# Patient Record
Sex: Female | Born: 1940 | Race: White | Hispanic: No | Smoking: Former smoker
Health system: Southern US, Community
[De-identification: ages and names within clinical notes are randomized; demographics above are authoritative.]

## PROBLEM LIST (undated history)

## (undated) DIAGNOSIS — K254 Chronic or unspecified gastric ulcer with hemorrhage: Secondary | ICD-10-CM

## (undated) DIAGNOSIS — K219 Gastro-esophageal reflux disease without esophagitis: Secondary | ICD-10-CM

## (undated) DIAGNOSIS — C801 Malignant (primary) neoplasm, unspecified: Secondary | ICD-10-CM

## (undated) DIAGNOSIS — I428 Other cardiomyopathies: Secondary | ICD-10-CM

## (undated) DIAGNOSIS — I2510 Atherosclerotic heart disease of native coronary artery without angina pectoris: Secondary | ICD-10-CM

## (undated) DIAGNOSIS — M179 Osteoarthritis of knee, unspecified: Secondary | ICD-10-CM

## (undated) DIAGNOSIS — F329 Major depressive disorder, single episode, unspecified: Secondary | ICD-10-CM

## (undated) DIAGNOSIS — H269 Unspecified cataract: Secondary | ICD-10-CM

## (undated) DIAGNOSIS — Z8489 Family history of other specified conditions: Secondary | ICD-10-CM

## (undated) DIAGNOSIS — T7840XA Allergy, unspecified, initial encounter: Secondary | ICD-10-CM

## (undated) DIAGNOSIS — C4490 Unspecified malignant neoplasm of skin, unspecified: Secondary | ICD-10-CM

## (undated) DIAGNOSIS — Z8781 Personal history of (healed) traumatic fracture: Secondary | ICD-10-CM

## (undated) DIAGNOSIS — M1710 Unilateral primary osteoarthritis, unspecified knee: Secondary | ICD-10-CM

## (undated) DIAGNOSIS — E7800 Pure hypercholesterolemia, unspecified: Secondary | ICD-10-CM

## (undated) DIAGNOSIS — R519 Headache, unspecified: Secondary | ICD-10-CM

## (undated) DIAGNOSIS — E039 Hypothyroidism, unspecified: Secondary | ICD-10-CM

## (undated) DIAGNOSIS — G510 Bell's palsy: Secondary | ICD-10-CM

## (undated) DIAGNOSIS — R0600 Dyspnea, unspecified: Secondary | ICD-10-CM

## (undated) DIAGNOSIS — I5020 Unspecified systolic (congestive) heart failure: Secondary | ICD-10-CM

## (undated) DIAGNOSIS — F32A Depression, unspecified: Secondary | ICD-10-CM

## (undated) DIAGNOSIS — R51 Headache: Secondary | ICD-10-CM

## (undated) DIAGNOSIS — I519 Heart disease, unspecified: Secondary | ICD-10-CM

## (undated) DIAGNOSIS — N390 Urinary tract infection, site not specified: Secondary | ICD-10-CM

## (undated) DIAGNOSIS — I10 Essential (primary) hypertension: Secondary | ICD-10-CM

## (undated) DIAGNOSIS — K5730 Diverticulosis of large intestine without perforation or abscess without bleeding: Secondary | ICD-10-CM

## (undated) HISTORY — DX: Unspecified cataract: H26.9

## (undated) HISTORY — DX: Malignant (primary) neoplasm, unspecified: C80.1

## (undated) HISTORY — DX: Personal history of (healed) traumatic fracture: Z87.81

## (undated) HISTORY — DX: Allergy, unspecified, initial encounter: T78.40XA

## (undated) HISTORY — PX: JOINT REPLACEMENT: SHX530

## (undated) HISTORY — PX: HIP SURGERY: SHX245

## (undated) HISTORY — PX: BREAST BIOPSY: SHX20

## (undated) HISTORY — PX: APPENDECTOMY: SHX54

---

## 1998-09-09 ENCOUNTER — Other Ambulatory Visit: Admission: RE | Admit: 1998-09-09 | Discharge: 1998-09-09 | Payer: Self-pay | Admitting: Family Medicine

## 2001-01-05 ENCOUNTER — Ambulatory Visit (HOSPITAL_COMMUNITY): Admission: RE | Admit: 2001-01-05 | Discharge: 2001-01-05 | Payer: Self-pay | Admitting: Family Medicine

## 2001-01-05 ENCOUNTER — Encounter: Payer: Self-pay | Admitting: Family Medicine

## 2001-03-29 ENCOUNTER — Encounter (HOSPITAL_COMMUNITY): Admission: RE | Admit: 2001-03-29 | Discharge: 2001-04-28 | Payer: Self-pay | Admitting: General Surgery

## 2003-08-14 ENCOUNTER — Other Ambulatory Visit: Admission: RE | Admit: 2003-08-14 | Discharge: 2003-08-14 | Payer: Self-pay | Admitting: Family Medicine

## 2006-01-15 ENCOUNTER — Ambulatory Visit: Payer: Self-pay | Admitting: Cardiology

## 2009-07-05 ENCOUNTER — Encounter: Payer: Self-pay | Admitting: Cardiovascular Disease

## 2009-07-16 ENCOUNTER — Ambulatory Visit: Payer: Self-pay | Admitting: Cardiovascular Disease

## 2009-07-16 DIAGNOSIS — R072 Precordial pain: Secondary | ICD-10-CM | POA: Insufficient documentation

## 2009-07-16 DIAGNOSIS — R9431 Abnormal electrocardiogram [ECG] [EKG]: Secondary | ICD-10-CM | POA: Insufficient documentation

## 2009-07-25 ENCOUNTER — Telehealth (INDEPENDENT_AMBULATORY_CARE_PROVIDER_SITE_OTHER): Payer: Self-pay | Admitting: *Deleted

## 2009-07-29 ENCOUNTER — Ambulatory Visit: Payer: Self-pay | Admitting: Cardiovascular Disease

## 2009-07-29 ENCOUNTER — Encounter (HOSPITAL_COMMUNITY): Admission: RE | Admit: 2009-07-29 | Discharge: 2009-09-24 | Payer: Self-pay | Admitting: Cardiovascular Disease

## 2009-07-29 ENCOUNTER — Ambulatory Visit (HOSPITAL_COMMUNITY): Admission: RE | Admit: 2009-07-29 | Discharge: 2009-07-29 | Payer: Self-pay | Admitting: Cardiovascular Disease

## 2009-07-29 ENCOUNTER — Ambulatory Visit: Payer: Self-pay

## 2009-07-29 ENCOUNTER — Encounter (INDEPENDENT_AMBULATORY_CARE_PROVIDER_SITE_OTHER): Payer: Self-pay | Admitting: *Deleted

## 2009-07-29 ENCOUNTER — Encounter: Payer: Self-pay | Admitting: Internal Medicine

## 2009-07-29 ENCOUNTER — Ambulatory Visit: Payer: Self-pay | Admitting: Internal Medicine

## 2009-07-30 ENCOUNTER — Encounter: Payer: Self-pay | Admitting: Cardiovascular Disease

## 2009-11-19 ENCOUNTER — Encounter: Payer: Self-pay | Admitting: Cardiovascular Disease

## 2009-12-02 ENCOUNTER — Ambulatory Visit: Payer: Self-pay | Admitting: Cardiovascular Disease

## 2010-03-25 NOTE — Assessment & Plan Note (Signed)
Summary: PER CHECK OUT/PT HAVING ECHO/MYOVIEW/SAF   Visit Type:  Follow-up  CC:  echo/myoview follow up.  pt states that celexa has made a big difference in the ways she feels.  .  History of Present Illness: 70 yo WF with history of HTN, hyperlipidemia, depression and OA who is here today for cardiac follow up. She was seen several weeks ago for cardiac evaluation prior to planned left knee replacement. She was found to have an abnormal EKG which showed inferior Q waves during pre-operative assessment. She told  me that she has had occasional chest pains in the center of her chest that gets better with a "pain pill". Thre is associated dyspnea but no nausea or diaphoresis. I ordered an echo and a Lexiscan myoview to risk stratify. She returns today for her testing. I am seeing her to review the results.   She has no complaints today. No recent chest pain. No SOB.     Current Medications (verified): 1)  Fish Oil 1200 Mg Caps (Omega-3 Fatty Acids) .... 4 Capsules A Day 2)  Alendronate Sodium 70 Mg Tabs (Alendronate Sodium) .... Once A Week 3)  Levothyroxine Sodium 75 Mcg Tabs (Levothyroxine Sodium) .... Take 1 Tablet By Mouth Once A Day 4)  Tramadol Hcl 50 Mg Tabs (Tramadol Hcl) .... As Needed 5)  Verapamil Hcl Cr 240 Mg Cr-Tabs (Verapamil Hcl) .... Take 1 1/2 Tablets Once A Day 6)  Furosemide 20 Mg Tabs (Furosemide) .... Take One Tablet By Mouth Daily. 7)  Meloxicam 15 Mg Tabs (Meloxicam) .... Take 1 Tablet By Mouth Once A Day 8)  Symbyax 6-25 Mg Caps (Olanzapine-Fluoxetine Hcl) .... Take 1 Capsule By Mouth Once A Day 9)  Ventolin Hfa 108 (90 Base) Mcg/act Aers (Albuterol Sulfate) .... As Needed 10)  Potassium Chloride Crys Cr 20 Meq Cr-Tabs (Potassium Chloride Crys Cr) .... Take One Tablet By Mouth Daily 11)  Simvastatin 40 Mg Tabs (Simvastatin) .... Take One Tablet By Mouth Daily At Bedtime 12)  Celexa 20 Mg Tabs (Citalopram Hydrobromide) .... One Tablet Once Daily  Allergies  (verified): 1)  ! Codeine 2)  ! Benadryl  Past History:  Past Medical History: Reviewed history from 07/16/2009 and no changes required. Hypertension High cholesterol Irregular heart beat Arthritis Depression Fatigue Left knee pain  Social History: Reviewed history from 07/16/2009 and no changes required. Pt. is widow - 2 children Quit smoking 4 years ago No alcohol use No durg use Retired [employer] since 1993  Review of Systems  The patient denies fatigue, malaise, fever, weight gain/loss, vision loss, decreased hearing, hoarseness, chest pain, palpitations, shortness of breath, prolonged cough, wheezing, sleep apnea, coughing up blood, abdominal pain, blood in stool, nausea, vomiting, diarrhea, heartburn, incontinence, blood in urine, muscle weakness, joint pain, leg swelling, rash, skin lesions, headache, fainting, dizziness, depression, anxiety, enlarged lymph nodes, easy bruising or bleeding, and environmental allergies.    Vital Signs:  Patient profile:   70 year old female Height:      61 inches Weight:      183 pounds BMI:     34.70 Pulse rate:   76 / minute Pulse rhythm:   regular BP sitting:   120 / 78  (left arm) Cuff size:   large  Vitals Entered By: Judithe Modest CMA (July 29, 2009 3:31 PM)  Physical Exam  General:  General: Well developed, well nourished, NAD Musculoskeletal: Muscle strength 5/5 all ext Psychiatric: Mood and affect normal Neck: No JVD, no carotid bruits, no  thyromegaly, no lymphadenopathy. Lungs:Clear bilaterally, no wheezes, rhonci, crackles CV: RRR no murmurs, gallops rubs Abdomen: soft, NT, ND, BS present Extremities: No edema, pulses 2+.    Nuclear Study  Procedure date:  07/29/2009  Findings:      Stress Procedure   The patient received IV Lexiscan 0.4 mg over 15-seconds.  Myoview injected at 30-seconds.  There were no significant changes with infusion.  Quantitative spect images were obtained after a 45 minute  delay.  QPS  Raw Data Images:  Soft tissue (diaphragm) underlies heart. Stress Images:  Thinning in the inferior wall (base and apex).  Apical thinning..  Otherwise normal perfusion. Rest Images:  Minimal change from the stress images. Subtraction (SDS):  No significant ischemia. Transient Ischemic Dilatation:  1.16  (Normal <1.22)  Lung/Heart Ratio:  .28  (Normal <0.45)  Quantitative Gated Spect Images  QGS EDV:  84 ml QGS ESV:  31 ml QGS EF:  64 %   Overall Impression   Exercise Capacity: Lexiscan protocol BP Response: Normal blood pressure response. Clinical Symptoms: No chest pain ECG Impression: No significant ST segment change suggestive of ischemia. Overall Impression Comments: Probable normal perfsuion and soft tissue attenuation (diaphragm). No signif ischemia or scar.   Echocardiogram  Procedure date:  07/29/2009  Findings:      Study Conclusions            - Left ventricle: LVEF is approximately 50% The cavity size was       normal. Wall thickness was normal. Doppler parameters are       consistent with abnormal left ventricular relaxation (grade 1       diastolic dysfunction).     - Pericardium, extracardiac: A trivial pericardial effusion was       identified. - No significant valvular disease.   Impression & Recommendations:  Problem # 1:  PRE-OPERATIVE CARDIAC EXAM (ICD-V72.81) Stress test with no evidence of ischemia. LV function normal. No significant valvular disease. No further cardiac workup prior to planned surgical procedure. I feel that her chest pain is most likely non-cardiac.   Her updated medication list for this problem includes:    Verapamil Hcl Cr 240 Mg Cr-tabs (Verapamil hcl) .[name]... Take 1 1/2 tablets once a day  Problem # 2:  CHEST PAIN-PRECORDIAL (ICD-786.51) See above.   Her updated medication list for this problem includes:    Verapamil Hcl Cr 240 Mg Cr-tabs (Verapamil hcl) .[name]... Take 1 1/2 tablets once a day  Patient  Instructions: 1)  Your physician recommends that you schedule a follow-up appointment as needed

## 2010-03-25 NOTE — Letter (Signed)
Summary: Outpatient Coinsurance Notice  Outpatient Coinsurance Notice   Imported By: Marylou Mccoy 08/01/2009 11:40:50  _____________________________________________________________________  External Attachment:    Type:   Image     Comment:   External Document

## 2010-03-25 NOTE — Assessment & Plan Note (Signed)
Summary: np6/ surgical clearance for abn ekg, pt has medicare medicade...   Visit Type:  Initial Consult  CC:  Surgical clearance- total left knee replacement. Abnormal EKG.  History of Present Illness: 70 yo WF with history of HTN, hyperlipidemia, depression and OA who is here today for cardiac evaluation prior to planned left knee replacement. She was found to have an abnormal EKG which showed inferior Q waves. She tells me that she has occasional chest pains in the center of her chest that gets better with a "pain pill". Thre is associated dyspnea but no nausea or diaphoresis. She has not felt well for several years. She has no energy. Her breathing is ok at rest but she gets easily dyspneic with walking up a hill. She occasionally feels her heart beating "harder" but really has not noticed any irregular beats.   Current Medications (verified): 1)  Fish Oil 1200 Mg Caps (Omega-3 Fatty Acids) .... 4 Capsules A Day 2)  Alendronate Sodium 70 Mg Tabs (Alendronate Sodium) .... Once A Week 3)  Levothyroxine Sodium 75 Mcg Tabs (Levothyroxine Sodium) .... Take 1 Tablet By Mouth Once A Day 4)  Tramadol Hcl 50 Mg Tabs (Tramadol Hcl) .... As Needed 5)  Alprazolam 0.5 Mg Tabs (Alprazolam) .... As Needed 6)  Verapamil Hcl Cr 240 Mg Cr-Tabs (Verapamil Hcl) .... Take 1 1/2 Tablets Once A Day 7)  Furosemide 20 Mg Tabs (Furosemide) .... Take One Tablet By Mouth Daily. 8)  Meloxicam 15 Mg Tabs (Meloxicam) .... Take 1 Tablet By Mouth Once A Day 9)  Symbyax 6-25 Mg Caps (Olanzapine-Fluoxetine Hcl) .... Take 1 Capsule By Mouth Once A Day 10)  Ventolin Hfa 108 (90 Base) Mcg/act Aers (Albuterol Sulfate) .... As Needed 11)  Potassium Chloride Crys Cr 20 Meq Cr-Tabs (Potassium Chloride Crys Cr) .... Take One Tablet By Mouth Daily 12)  Simvastatin 40 Mg Tabs (Simvastatin) .... Take One Tablet By Mouth Daily At Bedtime 13)  Lorazepam 0.5 Mg Tabs (Lorazepam) .... As Needed  Allergies (verified): 1)  ! Codeine 2)   ! Benadryl  Past History:  Past Medical History: Arthritis Depression HTN Hyperlipidemia  Past Surgical History: None  Family History: Mother-deceased, age 4 ? cause Father- deceased, age 70s, CAD  2 sisters alive, one with Alzheimers 2 brothers-one deceased from cancer and one has cancer.   Social History: No tobacco No alcohol No illicit drug use Retired-worked at [employer] in the past Widow, 2 children  Review of Systems       The patient complains of fatigue, chest pain, and shortness of breath.  The patient denies malaise, fever, weight gain/loss, vision loss, decreased hearing, hoarseness, palpitations, prolonged cough, wheezing, sleep apnea, coughing up blood, abdominal pain, blood in stool, nausea, vomiting, diarrhea, heartburn, incontinence, blood in urine, muscle weakness, joint pain, leg swelling, rash, skin lesions, headache, fainting, dizziness, depression, anxiety, enlarged lymph nodes, easy bruising or bleeding, and environmental allergies.    Vital Signs:  Patient profile:   70 year old female Height:      61 inches Weight:      183.25 pounds BMI:     34.75 Pulse rate:   80 / minute Pulse rhythm:   regular Resp:     18 per minute BP sitting:   98 / 70  (right arm) Cuff size:   large  Vitals Entered By: Vikki Ports (Jul 16, 2009 10:55 AM)  Physical Exam  General:  General: Well developed, well nourished, NAD HEENT: OP clear, mucus membranes  moist SKIN: warm, dry Neuro: No focal deficits Musculoskeletal: Muscle strength 5/5 all ext Psychiatric: Mood and affect normal Neck: No JVD, no carotid bruits, no thyromegaly, no lymphadenopathy. Lungs:Clear bilaterally, no wheezes, rhonci, crackles CV: RRR no murmurs, gallops rubs Abdomen: soft, NT, ND, BS present Extremities: No edema, pulses 2+.    EKG  Procedure date:  07/16/2009  Findings:      NSR, rate 81 bpm. LVH. Possible inferior infarct with Q waves III, AvF.   Impression &  Recommendations:  Problem # 1:  PRE-OPERATIVE CARDIAC EXAM (ICD-V72.81) Ms. Broyles is here today for evaluation prior to planned knee replacement. 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 She has not had a recent echo and has had no previous cardiac workup. No previous ischemia testing. She tells me that she has episodes of chest pressure/pain with ongoing fatigue and exertional dyspnea. Based on all of this, I will arrange a Lexiscan myoview to rule out ischemia and an echo to assess for underlying structural heart disease, assess PA pressures.  I will see her back on two weeks (before her surgical follow up) to review the results.   Problem # 2:  ABNORMAL EKG (ICD-794.31)  See above.   Her updated medication list for this problem includes:    Verapamil Hcl Cr 240 Mg Cr-tabs (Verapamil hcl) .[name]... Take 1 1/2 tablets once a day  Orders: Echocardiogram (Echo) Nuclear Stress Test (Nuc Stress Test)  Her updated medication list for this problem includes:    Verapamil Hcl Cr 240 Mg Cr-tabs (Verapamil hcl) .[name]... Take 1 1/2 tablets once a day  Problem # 3:  CHEST PAIN-PRECORDIAL (ICD-786.51)  See above.   Her updated medication list for this problem includes:    Verapamil Hcl Cr 240 Mg Cr-tabs (Verapamil hcl) .[name]... Take 1 1/2 tablets once a day  Orders: EKG w/ Interpretation (93000) Echocardiogram (Echo) Nuclear Stress Test (Nuc Stress Test)  Her updated medication list for this problem includes:    Verapamil Hcl Cr 240 Mg Cr-tabs (Verapamil hcl) .[name]... Take 1 1/2 tablets once a day  Patient Instructions: 1)  Your physician recommends that you schedule a follow-up appointment on June 6 or 7 2)  Your physician recommends that you continue on your current medications as directed. Please refer to the Current Medication list given to you today. 3)  Your physician has requested that you have an echocardiogram.  Echocardiography is a painless test that uses sound waves to create images of  your heart. It provides your doctor with information about the size and shape of your heart and how well your heart's chambers and valves are working.  This procedure takes approximately one hour. There are no restrictions for this procedure. 4)  Your physician has requested that you have an adenosine myoview.  For further information please visit [email or web address]  Please follow instruction sheet, as given.

## 2010-03-25 NOTE — Progress Notes (Signed)
Summary: [facility]   [facility]   Imported By: Roderic Ovens 12/13/2009 14:23:50  _____________________________________________________________________  External Attachment:    Type:   Image     Comment:   External Document

## 2010-03-25 NOTE — Assessment & Plan Note (Signed)
Summary: Cardiology Nuclear Study  Nuclear Med Background Indications for Stress Test: Evaluation for Ischemia, Surgical Clearance, Abnormal EKG  Indications Comments: Pending (L) TKR   History Comments: NO DOCUMENTED CAD  Symptoms: Chest Tightness, Chest Tightness with Exertion, DOE, Fatigue, Rapid HR  Symptoms Comments: Last episode of CP:1 month   Nuclear Pre-Procedure Cardiac Risk Factors: Family History - CAD, History of Smoking, Hypertension, Lipids, Obesity Caffeine/Decaff Intake: None NPO After: 11:30 PM Lungs: Clear.  O2 Sat 96% on RA. IV 0.9% NS with Angio Cath: 22g     IV Site: (R) Wrist IV Started by: Irean Hong RN Chest Size (in) 38     Cup Size C     Height (in): 61 Weight (lb): 184 BMI: 34.89  Nuclear Med Study 1 or 2 day study:  1 day     Stress Test Type:  Eugenie Birks Reading MD:  Dietrich Pates, MD     Referring MD:  Verne Carrow, MD Resting Radionuclide:  Technetium 70m Tetrofosmin     Resting Radionuclide Dose:  11 mCi  Stress Radionuclide:  Technetium 25m Tetrofosmin     Stress Radionuclide Dose:  33 mCi   Stress Protocol   Lexiscan: 0.4 mg   Stress Test Technologist:  Rea College CMA-N     Nuclear Technologist:  Domenic Polite CNMT  Rest Procedure  Myocardial perfusion imaging was performed at rest 45 minutes following the intravenous administration of Myoview Technetium 7m Tetrofosmin.  Stress Procedure  The patient received IV Lexiscan 0.4 mg over 15-seconds.  Myoview injected at 30-seconds.  There were no significant changes with infusion.  Quantitative spect images were obtained after a 45 minute delay.  QPS Raw Data Images:  Soft tissue (diaphragm) underlies heart. Stress Images:  Thinning in the inferior wall (base and apex).  Apical thinning..  Otherwise normal perfusion. Rest Images:  Minimal change from the stress images. Subtraction (SDS):  No significant ischemia. Transient Ischemic Dilatation:  1.16  (Normal <1.22)  Lung/Heart Ratio:  .28  (Normal <0.45)  Quantitative Gated Spect Images QGS EDV:  84 ml QGS ESV:  31 ml QGS EF:  64 %   Overall Impression  Exercise Capacity: Lexiscan protocol BP Response: Normal blood pressure response. Clinical Symptoms: No chest pain ECG Impression: No significant ST segment change suggestive of ischemia. Overall Impression Comments: Probable normal perfsuion and soft tissue attenuation (diaphragm). No signif ischemia or scar.  Appended Document: Cardiology Nuclear Study No evidence of ischemia. d/w pt's niece on phone (pt asked me to call her niece with results). cdm

## 2010-03-25 NOTE — Assessment & Plan Note (Signed)
Summary: [name]. CHESTPAIN ,EKG CHANGES/ GD   Visit Type:  rov Primary Provider:  Paulita [name]  CC:  chest pain....denies any sob or edema.  History of Present Illness: 70 yo WF with history of HTN, hyperlipidemia, depression and OA who is here today for cardiac follow up. She was seen last in June 2011 for cardiac evaluation prior to planned left knee replacement. She was found to have an abnormal EKG which showed inferior Q waves during pre-operative assessment. She told  me that she has had occasional chest pains in the center of her chest that gets better with a "pain pill". There was associated dyspnea but no nausea or diaphoresis. I ordered an echo and a Lexiscan myoview to risk stratify. Her stress test showed no ischemia. Her echo   She was recently seen by her primary care provider. She tells me that an EKG in primary care was abnormal. I do not have that EKG for review. Last week, she was driving home from the grocery store and had onset of substernal chest pain. No radiation of pain. Pain lasted for several minutes and she took a Tramadol. Was a heavy sensation. No associated SOB, dizziness, diaphoresis or nausea. This is the only episode of chest pain that she has had over the past 4 months. She denies any exertional chest pain or SOB. No near syncope or syncope. She has been taking  all of her medications.     Current Medications (verified): 1)  Fish Oil 1200 Mg Caps (Omega-3 Fatty Acids) .... 4 Capsules A Day 2)  Alendronate Sodium 70 Mg Tabs (Alendronate Sodium) .... Once A Week 3)  Levothyroxine Sodium 75 Mcg Tabs (Levothyroxine Sodium) .... Take 1 Tablet By Mouth Once A Day 4)  Tramadol Hcl 50 Mg Tabs (Tramadol Hcl) .... As Needed 5)  Verapamil Hcl Cr 240 Mg Cr-Tabs (Verapamil Hcl) .... Take 1 1/2 Tablets Once A Day 6)  Furosemide 20 Mg Tabs (Furosemide) .... Take One Tablet By Mouth Daily. 7)  Meloxicam 15 Mg Tabs (Meloxicam) .... Take 1 Tablet By Mouth Once A Day 8)  Symbyax  6-25 Mg Caps (Olanzapine-Fluoxetine Hcl) .... Take 1 Capsule By Mouth Once A Day 9)  Ventolin Hfa 108 (90 Base) Mcg/act Aers (Albuterol Sulfate) .... As Needed 10)  Potassium Chloride Crys Cr 20 Meq Cr-Tabs (Potassium Chloride Crys Cr) .... Take One Tablet By Mouth Daily 11)  Simvastatin 40 Mg Tabs (Simvastatin) .... Take One Tablet By Mouth Daily At Bedtime 12)  Celexa 20 Mg Tabs (Citalopram Hydrobromide) .... One Tablet Once Daily  Allergies: 1)  ! Codeine 2)  ! Benadryl  Past History:  Past Medical History: Reviewed history from 07/16/2009 and no changes required. Hypertension High cholesterol Irregular heart beat Arthritis Depression Fatigue Left knee pain  Social History: Reviewed history from 07/16/2009 and no changes required. Pt. is widow - 2 children Quit smoking 4 years ago No alcohol use No durg use Retired [employer] since 1993  Review of Systems       The patient complains of chest pain.  The patient denies fatigue, malaise, fever, weight gain/loss, vision loss, decreased hearing, hoarseness, palpitations, shortness of breath, prolonged cough, wheezing, sleep apnea, coughing up blood, abdominal pain, blood in stool, nausea, vomiting, diarrhea, heartburn, incontinence, blood in urine, muscle weakness, joint pain, leg swelling, rash, skin lesions, headache, fainting, dizziness, depression, anxiety, enlarged lymph nodes, easy bruising or bleeding, and environmental allergies.    Vital Signs:  Patient profile:   70 year old  female Height:      61 inches Weight:      184.8 pounds BMI:     35.04 Pulse rate:   72 / minute Pulse rhythm:   irregular BP sitting:   126 / 80  (left arm) Cuff size:   large  Vitals Entered By: Danielle Rankin, CMA (December 02, 2009 3:01 PM)  Physical Exam  General:  General: Well developed, well nourished, NAD Musculoskeletal: Muscle strength 5/5 all ext Psychiatric: Mood and affect normal Neck: No JVD, no carotid bruits, no thyromegaly,  no lymphadenopathy. Lungs:Clear bilaterally, no wheezes, rhonci, crackles CV: RRR no murmurs, gallops rubs Abdomen: soft, NT, ND, BS present Extremities: No edema, pulses 2+.    EKG  Procedure date:  12/02/2009  Findings:      NSR, rate 72 bpm. Poor R wave progression through the precordial leads.   Impression & Recommendations:  Problem # 1:  CHEST PAIN-PRECORDIAL (ICD-786.51) Her chest pain is atypical. Occurred at rest. One episode over last 4 months. This does not sound cardiac. She had a normal stress test four months ago and normal echo at that time. No further ischemic workup at this time. Pain is most likely non-cardiac.  She will call us if she has more frequent episodes of chest pain or if the chest pain changes in character.   Her updated medication list for this problem includes:    Verapamil Hcl Cr 240 Mg Cr-tabs (Verapamil hcl) .[name]... Take 1 1/2 tablets once a day  Orders: EKG w/ Interpretation (93000)  Patient Instructions: 1)  Your physician recommends that you schedule a follow-up appointment in: 6 months 2)  Your physician recommends that you continue on your current medications as directed. Please refer to the Current Medication list given to you today.

## 2010-03-25 NOTE — Progress Notes (Signed)
Summary: Nuclear Pre-Procedure  Phone Note Outgoing Call   Call placed by: Milana Na, EMT-P,  July 25, 2009 2:36 PM Summary of Call: Reviewed information on Myoview Information Sheet (see scanned document for further details).  Busy     Nuclear Med Background Indications for Stress Test: Evaluation for Ischemia, Surgical Clearance, Abnormal EKG  Indications Comments: Inferior Q's, LVH, and Pending (L) TKR    Symptoms: Chest Pain, DOE, Fatigue    Nuclear Pre-Procedure Cardiac Risk Factors: Family History - CAD, Hypertension, Lipids Height (in): 61  Nuclear Med Study Referring MD:  C.McAlhany

## 2010-03-28 NOTE — Letter (Signed)
Summary: Surgical Clearance - [facility]  Surgical Clearance - [facility]   Imported By: Marylou Mccoy 07/30/2009 09:16:27  _____________________________________________________________________  External Attachment:    Type:   Image     Comment:   External Document

## 2010-04-24 HISTORY — PX: KNEE SURGERY: SHX244

## 2010-04-28 ENCOUNTER — Other Ambulatory Visit (HOSPITAL_COMMUNITY): Payer: Medicare Other

## 2010-04-29 ENCOUNTER — Other Ambulatory Visit: Payer: Self-pay | Admitting: Specialist

## 2010-04-29 ENCOUNTER — Ambulatory Visit (HOSPITAL_COMMUNITY)
Admission: RE | Admit: 2010-04-29 | Discharge: 2010-04-29 | Disposition: A | Discharge status: Home / Self Care | Payer: Medicare Other | Source: Ambulatory Visit | Attending: Anesthesiology | Admitting: Anesthesiology

## 2010-04-29 ENCOUNTER — Other Ambulatory Visit (HOSPITAL_COMMUNITY): Payer: Self-pay | Admitting: Anesthesiology

## 2010-04-29 ENCOUNTER — Encounter (HOSPITAL_COMMUNITY): Payer: Medicare Other

## 2010-04-29 DIAGNOSIS — Z01812 Encounter for preprocedural laboratory examination: Secondary | ICD-10-CM | POA: Insufficient documentation

## 2010-04-29 DIAGNOSIS — Z01818 Encounter for other preprocedural examination: Secondary | ICD-10-CM | POA: Insufficient documentation

## 2010-04-29 DIAGNOSIS — Z01811 Encounter for preprocedural respiratory examination: Secondary | ICD-10-CM

## 2010-04-29 LAB — COMPREHENSIVE METABOLIC PANEL
ALT: 37 U/L — ABNORMAL HIGH (ref 0–35)
AST: 38 U/L — ABNORMAL HIGH (ref 0–37)
Albumin: 4.5 g/dL (ref 3.5–5.2)
Alkaline Phosphatase: 49 U/L (ref 39–117)
BUN: 11 mg/dL (ref 6–23)
CO2: 28 mEq/L (ref 19–32)
Calcium: 10.3 mg/dL (ref 8.4–10.5)
Chloride: 103 mEq/L (ref 96–112)
Creatinine, Ser: 0.64 mg/dL (ref 0.4–1.2)
GFR calc Af Amer: >60 mL/min (ref >60)
GFR calc non Af Amer: >60 mL/min (ref >60)
Glucose, Bld: 103 mg/dL — ABNORMAL HIGH (ref 70–99)
Potassium: 4.1 mEq/L (ref 3.5–5.1)
Sodium: 141 mEq/L (ref 135–145)
Total Bilirubin: 1 mg/dL (ref 0.3–1.2)
Total Protein: 7.5 g/dL (ref 6.0–8.3)

## 2010-04-29 LAB — URINALYSIS, ROUTINE W REFLEX MICROSCOPIC
Bilirubin Urine: NEGATIVE
Glucose, UA: NEGATIVE mg/dL
Hgb urine dipstick: NEGATIVE
Ketones, ur: NEGATIVE mg/dL
Nitrite: NEGATIVE
Protein, ur: NEGATIVE mg/dL
Specific Gravity, Urine: 1.018 (ref 1.005–1.030)
Urobilinogen, UA: 0.2 mg/dL (ref 0.0–1.0)
pH: 7.5 (ref 5.0–8.0)

## 2010-04-29 LAB — DIFFERENTIAL
Basophils Absolute: 0.1 10*3/uL (ref 0.0–0.1)
Basophils Relative: 1 % (ref 0–1)
Eosinophils Absolute: 0.2 10*3/uL (ref 0.0–0.7)
Eosinophils Relative: 2 % (ref 0–5)
Lymphocytes Relative: 36 % (ref 12–46)
Lymphs Abs: 3.6 10*3/uL (ref 0.7–4.0)
Monocytes Absolute: 0.6 10*3/uL (ref 0.1–1.0)
Monocytes Relative: 6 % (ref 3–12)
Neutro Abs: 5.4 10*3/uL (ref 1.7–7.7)
Neutrophils Relative %: 55 % (ref 43–77)

## 2010-04-29 LAB — CBC
HCT: 43.5 % (ref 36.0–46.0)
Hemoglobin: 14.5 g/dL (ref 12.0–15.0)
MCH: 29.4 pg (ref 26.0–34.0)
MCHC: 33.3 g/dL (ref 30.0–36.0)
MCV: 88.2 fL (ref 78.0–100.0)
Platelets: 301 K/uL (ref 150–400)
RBC: 4.93 MIL/uL (ref 3.87–5.11)
RDW: 13.8 % (ref 11.5–15.5)
WBC: 9.9 K/uL (ref 4.0–10.5)

## 2010-04-29 LAB — SURGICAL PCR SCREEN
MRSA, PCR: NEGATIVE
Staphylococcus aureus: NEGATIVE

## 2010-04-29 LAB — PROTIME-INR
INR: 0.98 (ref 0.00–1.49)
Prothrombin Time: 13.2 s (ref 11.6–15.2)

## 2010-04-29 LAB — URINE MICROSCOPIC-ADD ON

## 2010-04-29 LAB — APTT: aPTT: 33 s (ref 24–37)

## 2010-05-02 ENCOUNTER — Inpatient Hospital Stay (HOSPITAL_COMMUNITY)
Admission: RE | Admit: 2010-05-02 | Discharge: 2010-05-05 | DRG: 470 | Disposition: A | Discharge status: Home Health | Payer: Medicare Other | Source: Ambulatory Visit | Attending: Specialist | Admitting: Specialist

## 2010-05-02 DIAGNOSIS — D62 Acute posthemorrhagic anemia: Secondary | ICD-10-CM | POA: Diagnosis not present

## 2010-05-02 DIAGNOSIS — E039 Hypothyroidism, unspecified: Secondary | ICD-10-CM | POA: Diagnosis present

## 2010-05-02 DIAGNOSIS — I10 Essential (primary) hypertension: Secondary | ICD-10-CM | POA: Diagnosis present

## 2010-05-02 DIAGNOSIS — Z87440 Personal history of urinary (tract) infections: Secondary | ICD-10-CM

## 2010-05-02 DIAGNOSIS — F341 Dysthymic disorder: Secondary | ICD-10-CM | POA: Diagnosis present

## 2010-05-02 DIAGNOSIS — M1710 Unilateral primary osteoarthritis, unspecified knee: Principal | ICD-10-CM | POA: Diagnosis present

## 2010-05-02 DIAGNOSIS — E7800 Pure hypercholesterolemia, unspecified: Secondary | ICD-10-CM | POA: Diagnosis present

## 2010-05-02 LAB — TYPE AND SCREEN
ABO/RH(D): O POS
Antibody Screen: NEGATIVE

## 2010-05-02 LAB — ABO/RH: ABO/RH(D): O POS

## 2010-05-03 LAB — BASIC METABOLIC PANEL
BUN: 9 mg/dL (ref 6–23)
CO2: 24 mEq/L (ref 19–32)
Calcium: 9 mg/dL (ref 8.4–10.5)
Chloride: 109 mEq/L (ref 96–112)
Creatinine, Ser: 0.87 mg/dL (ref 0.4–1.2)
GFR calc Af Amer: >60 mL/min (ref >60)
GFR calc non Af Amer: >60 mL/min (ref >60)
Glucose, Bld: 130 mg/dL — ABNORMAL HIGH (ref 70–99)
Potassium: 4.7 mEq/L (ref 3.5–5.1)
Sodium: 137 mEq/L (ref 135–145)

## 2010-05-03 LAB — CBC
HCT: 31.2 % — ABNORMAL LOW (ref 36.0–46.0)
Hemoglobin: 10.1 g/dL — ABNORMAL LOW (ref 12.0–15.0)
MCH: 28.9 pg (ref 26.0–34.0)
MCHC: 32.4 g/dL (ref 30.0–36.0)
MCV: 89.4 fL (ref 78.0–100.0)
Platelets: 189 10*3/uL (ref 150–400)
RBC: 3.49 MIL/uL — ABNORMAL LOW (ref 3.87–5.11)
RDW: 13.9 % (ref 11.5–15.5)
WBC: 11.6 10*3/uL — ABNORMAL HIGH (ref 4.0–10.5)

## 2010-05-04 LAB — CBC
HCT: 29.6 % — ABNORMAL LOW (ref 36.0–46.0)
Hemoglobin: 9.8 g/dL — ABNORMAL LOW (ref 12.0–15.0)
MCH: 29.3 pg (ref 26.0–34.0)
MCHC: 33.1 g/dL (ref 30.0–36.0)
MCV: 88.6 fL (ref 78.0–100.0)
Platelets: 189 10*3/uL (ref 150–400)
RBC: 3.34 MIL/uL — ABNORMAL LOW (ref 3.87–5.11)
RDW: 13.7 % (ref 11.5–15.5)
WBC: 12.9 10*3/uL — ABNORMAL HIGH (ref 4.0–10.5)

## 2010-05-04 LAB — BASIC METABOLIC PANEL
BUN: 8 mg/dL (ref 6–23)
CO2: 22 mEq/L (ref 19–32)
Calcium: 9.3 mg/dL (ref 8.4–10.5)
Chloride: 107 mEq/L (ref 96–112)
Creatinine, Ser: 0.74 mg/dL (ref 0.4–1.2)
GFR calc Af Amer: >60 mL/min (ref >60)
GFR calc non Af Amer: >60 mL/min (ref >60)
Glucose, Bld: 134 mg/dL — ABNORMAL HIGH (ref 70–99)
Potassium: 4 mEq/L (ref 3.5–5.1)
Sodium: 134 mEq/L — ABNORMAL LOW (ref 135–145)

## 2010-05-05 LAB — CBC
HCT: 25.7 % — ABNORMAL LOW (ref 36.0–46.0)
Hemoglobin: 8.4 g/dL — ABNORMAL LOW (ref 12.0–15.0)
MCH: 28.9 pg (ref 26.0–34.0)
MCHC: 32.7 g/dL (ref 30.0–36.0)
MCV: 88.3 fL (ref 78.0–100.0)
Platelets: 170 10*3/uL (ref 150–400)
RBC: 2.91 MIL/uL — ABNORMAL LOW (ref 3.87–5.11)
RDW: 13.6 % (ref 11.5–15.5)
WBC: 11 10*3/uL — ABNORMAL HIGH (ref 4.0–10.5)

## 2010-05-22 ENCOUNTER — Ambulatory Visit: Payer: Medicare Other | Attending: Specialist | Admitting: Physical Therapy

## 2010-05-22 DIAGNOSIS — M6281 Muscle weakness (generalized): Secondary | ICD-10-CM | POA: Insufficient documentation

## 2010-05-22 DIAGNOSIS — M25669 Stiffness of unspecified knee, not elsewhere classified: Secondary | ICD-10-CM | POA: Insufficient documentation

## 2010-05-22 DIAGNOSIS — Z96659 Presence of unspecified artificial knee joint: Secondary | ICD-10-CM | POA: Insufficient documentation

## 2010-05-22 DIAGNOSIS — R5381 Other malaise: Secondary | ICD-10-CM | POA: Insufficient documentation

## 2010-05-22 DIAGNOSIS — IMO0001 Reserved for inherently not codable concepts without codable children: Secondary | ICD-10-CM | POA: Insufficient documentation

## 2010-05-22 DIAGNOSIS — M25569 Pain in unspecified knee: Secondary | ICD-10-CM | POA: Insufficient documentation

## 2010-05-27 ENCOUNTER — Ambulatory Visit: Payer: Medicare Other | Attending: Specialist | Admitting: Physical Therapy

## 2010-05-27 DIAGNOSIS — M6281 Muscle weakness (generalized): Secondary | ICD-10-CM | POA: Insufficient documentation

## 2010-05-27 DIAGNOSIS — M25669 Stiffness of unspecified knee, not elsewhere classified: Secondary | ICD-10-CM | POA: Insufficient documentation

## 2010-05-27 DIAGNOSIS — R5381 Other malaise: Secondary | ICD-10-CM | POA: Insufficient documentation

## 2010-05-27 DIAGNOSIS — M25569 Pain in unspecified knee: Secondary | ICD-10-CM | POA: Insufficient documentation

## 2010-05-27 DIAGNOSIS — Z96659 Presence of unspecified artificial knee joint: Secondary | ICD-10-CM | POA: Insufficient documentation

## 2010-05-27 DIAGNOSIS — IMO0001 Reserved for inherently not codable concepts without codable children: Secondary | ICD-10-CM | POA: Insufficient documentation

## 2010-05-29 ENCOUNTER — Ambulatory Visit: Payer: Medicare Other | Admitting: Physical Therapy

## 2010-06-03 ENCOUNTER — Ambulatory Visit: Payer: Medicare Other | Admitting: Physical Therapy

## 2010-06-05 ENCOUNTER — Ambulatory Visit: Payer: Medicare Other | Admitting: Physical Therapy

## 2010-06-10 ENCOUNTER — Ambulatory Visit: Payer: Medicare Other | Admitting: *Deleted

## 2010-06-12 ENCOUNTER — Encounter: Payer: Medicare Other | Admitting: *Deleted

## 2010-06-19 NOTE — H&P (Signed)
Sheila [name], Sheila [name]                  ACCOUNT NO.:  [id number]  MEDICAL RECORD NO.:  [id number]           PATIENT TYPE:  I  LOCATION:  1S                           FACILITY:  [facility]  PHYSICIAN:  Erasmo Leventhal, M.D.DATE OF BIRTH:  1940/09/22  DATE OF ADMISSION:  05/02/2010 DATE OF DISCHARGE:                             HISTORY & PHYSICAL   CHIEF COMPLAINT:  Bilateral knee pain.  HISTORY OF PRESENT ILLNESS:  The patient is a 70 year old female, well known to Dr. Thomasena Edis, for evaluation/treatment of bilateral knee pain. The patient has failed conservative treatment.  The patient has elected to proceed with a total knee arthroplasty at home, but left is significantly worse than the right.  X-rays reveal she is bone-on-bone medial compartment.  She has got significant tricompartmental arthritic changes.  She does have a slight varus deformity.  ALLERGIES:  ADVIL and CODEINE, both causing severe nausea.  The patient's physician's medical history indicates her allergies are CODEINE and BENADRYL.  CURRENT MEDICATIONS: 1. Fish oil. 2. Alendronate. 3. Levothyroxine. 4. Tramadol. 5. Verapamil. 6. Lasix. 7. Meloxicam. 8. Symbyax. 9. Ventolin inhaler. 10.Potassium. 11.Simvastatin. 12.Celexa.  PAST MEDICAL HISTORY: 1. Anxiety/depression. 2. Hypertension. 3. Hypercholesterolemia. 4. Cardiac dysrhythmia. 5. Thyroid disease. 6. Obesity.  REVIEW OF SYSTEMS:  NEUROLOGIC:  She does have her anxiety and depression, she feels they are well controlled, managed by her primary care physician.  She denies any history of strokes or seizures.  She has had shingles on the right lower and left lower abdominal wall region. PULMONARY:  She has had pneumonia in the past, several years previous. No problems with COPD, sleep apnea, asthma, or tuberculosis. CARDIOVASCULAR:  She does have hypertension, hypercholesteremia, history of a heart arrhythmia.  She had a cardiac stress test, June  2011, through the [facility] with a stress test that showed no evidence of ischemia.  Left ventricular function was normal.  No significant vascular disease or bruits.  She denies any recent chest pains, shortness of breath, or irregular heart rhythms.  GI:  She denies any issues with reflux, ulcers, hepatitis, gallbladder issues, irritable bowel, colitis, colon or pancreatic issues.  GU:  She denies any urinary incontinence and kidney stones.  She has had urinary tract infection, probably several months ago.  Her preop lab work did show some bacteria and she was placed on Bactrim DS and she will be on it full 2 days prior to her surgery.  ENDOCRINE:  She does have history of thyroid disease. She denies any diabetes, but she has had some hypoglycemic events in the past.  HEMATOLOGIC:  Denies any anemia, blood clots.  She has had some skin cancers removed from her face.  PAST SURGICAL HISTORY:  She had breast biopsies without any problems with anesthesia, but she had a difficult time laying her head down flat on the table because it does cause her head to swim.  FAMILY MEDICAL HISTORY:  Father was deceased from lung cancer.  Mother had a stroke, gallbladder disease.  SOCIAL HISTORY:  The patient is widowed.  She does dip, snuff.  No alcohol.  She has  got 2 grown children.  Family will provide care postop.  PHYSICAL EXAMINATION:  VITAL SIGNS:  Height is 5 feet 1.5 inches, weight is 179, blood pressure is 128/78, pulse of 80 and regular, respirations 12 and nonlabored, patient is afebrile. GENERAL:  This is a heavy-set female, conscious, alert, and appropriate. She does walk without any assistance. HEENT:  Head is normocephalic.  Pupils equal, round, and reactive. Gross hearing is intact. NECK:  Supple.  No palpable lymphadenopathy.  Good range of motion. CHEST:  Lung sounds are clear. HEART:  Regular rate and rhythm. ABDOMEN:  Soft.  Bowel sounds present, round,  obese. EXTREMITIES:  Upper extremities had good range of motion with good motor strength.  Lower extremities, she had good motion of both hips.  She lacked about 5 degrees of full extension on her left.  She was able to flex it back to 120.  She did have crepitus in knee.  She had no instability.  Her right knee, she was able to get near-full extension, flexion to 120.  No instability. PERIPHERAL VASCULAR:  Carotid pulses were 2+, radial pulses 2+, posterior tibial pulses 1+.  She did have some lower extremity edema and a few scattered varicosities, but no significant pigmentation changes. BREAST, RECTAL, AND GU:  Deferred at this time.  IMPRESSION: 1. End-stage osteoarthritis, left knee, bone-on-bone medial     compartment, varus deformity, tricompartmental arthritic changes. 2. Moderate arthritic changes, right knee. 3. Anxiety/depression. 4. Hypertension. 5. Hypercholesterolemia. 6. Cardiac dysrhythmia. 7. History of urinary tract infections, currently on an antibiotic. 8. Thyroid disease.  PLAN:  The patient will undergo all routine labs and tests prior to having a left total knee arthroplasty by Dr. Thomasena Edis at [facility] on May 02, 2010.  The patient's primary care physician is [facility].  She has had a cardiac preoperative workup through the [facility], Dr. Verne Carrow.     Jamelle Rushing, P.A.   ______________________________ Erasmo Leventhal, M.D.    RWK/MEDQ  D:  05/01/2010  T:  05/02/2010  Job:  [id number]  Electronically Signed by Arlyn Leak P.A. on 05/28/2010 07:44:20 AM Electronically Signed by Eugenia Mcalpine M.D. on 06/19/2010 09:25:44 AM

## 2010-06-19 NOTE — Discharge Summary (Signed)
[name], Sheila [name]                  ACCOUNT NO.:  [id number]  MEDICAL RECORD NO.:  [id number]           PATIENT TYPE:  I  LOCATION:  1610                         FACILITY:  [facility]  PHYSICIAN:  Erasmo Leventhal, M.D.DATE OF BIRTH:  [birth year]  DATE OF ADMISSION:  05/02/2010 DATE OF DISCHARGE:  05/05/2010                              DISCHARGE SUMMARY   ADMISSION DIAGNOSES: 1. Bilateral knee pain with end-stage osteoarthritis left knee. 2. Anxiety/depression. 3. Hypertension. 4. Hypercholesterolemia. 5. Cardiac dysrhythmia. 6. History of urinary tract infections with currently on a antibiotic     due to preoperative bacteremia. 7. Hypothyroid disease.  DISCHARGE DIAGNOSES: 1. Left total knee arthroplasty, stable without complications. 2. Moderate arthritis, right knee. 3. Acute postoperative blood loss anemia, asymptomatic, stable vital     signs, tolerates well with physical therapy.  Will allow to self-     correct with p.o. supplementation of iron. 4. Anxiety/depression. 5. Hypertension. 6. Hypercholesterolemia. 7. History of cardiac dysrhythmia. 8. History of urinary tract infections with bacteremia on preoperative     labs, probable contaminant. 9. History of thyroid disease.  HISTORY OF PRESENT ILLNESS:  The patient is a 70 year old female well known to Dr. Thomasena Edis for treatment of bilateral knee pain.  The patient has failed conservative treatment.  The patient elected to proceed with left total knee arthroplasty due to significantly worsening left knee pain.  X-rays revealed she was bone-on-bone medial compartment with tricompartmental arthritic changes.  ALLERGIES ON ADMISSION:  ADVIL and CODEINE causing nausea.  Medical history indicated CODEINE and BENADRYL.  MEDICATIONS ON ADMISSION: 1. Fish oil. 2. Alendronate. 3. Levothyroxine. 4. Tramadol. 5. Verapamil. 6. Lasix. 7. Meloxicam. 8. Symbyax. 9. Ventolin  inhaler. 10.Potassium. 11.Simvastatin. 12.Celexa.  SURGICAL PROCEDURES:  On May 02, 2010, the patient was taken to the OR by Dr. Valma Cava, assisted by Oneida Alar, PA-C.  Under regional and spinal anesthesia, the patient underwent a left total knee arthroplasty without any complications.  The patient was transferred to recovery room then to the orthopedic floor in good condition to follow a total knee protocol.  The patient had the following components implanted, a size 2.5 MBT Keel tibial tray, a size 2.5 Sigma femoral component, a size 2.5, 12.5 thickness polyethylene bearing, a size 35 three-peg patella.  CONSULS:  Following routine consults requested: 1. Physical Therapy. 2. Case Management.  HOSPITAL COURSE:  On May 02, 2010, the patient was admitted to [facility] for total knee arthroplasty on the left.  The patient had the surgical procedure performed without any complications, transferred to the recovery room and then to the orthopedic floor in good condition. The patient then spent 3 days postoperative course on the orthopedic floor in which the patient did very well.  The patient had no complaints or problems.  The patient's vital signs remained stable.  She remained afebrile.  The patient's hemoglobin did drop down to 8.4 but she tolerated it very well without any unstable vital signs.  She also denied lightheadedness or dizziness, shortness of breath or chest discomfort while she was up and participating with Physical Therapy and she  was able to walk about 90 feet on postop day #2.  The patient denied any issues.  Her blood level was discussed with the patient, and she decided to allow to self correct with p.o. supplementation with iron over the next couple of weeks.  The patient was also eager and ready to be discharged home.  Her wound was benign for any signs of infection. Her leg was neuromotor vascularly intact.  She was weaned off of her  IV medications over the last several days and was tolerating it well with p.o. supplementation.  CPM was 0-50 degrees and she ambulated 45 feet and 90 feet during 2 PT sessions yesterday, so arrangement was made for her to be discharged to home with outpatient followup.  She was instructed if she should have any worsening of lightheadedness, dizziness, shortness of breath, or chest pain, she is to contact her primary care physician at [facility].  LABORATORY DATA:  Urinalysis on admission showed moderate leukocyte esterase, negative nitrites.  She had a few squamous epithelial cells, 11-20 wbc's, few bacteria, mucus present, possibly contaminant but she was placed on Bactrim DS prior to her admission and routine postop antibiotics.  Routine chemistries on postop day #2 found sodium 134, potassium 4.0, BUN 8, creatinine 0.74 with 134 glucose.  This was felt due to diet and activity and postop stress, allowing to self correct. Hemoglobin on admission was 14.5 with hematocrit 43.5, platelets of 301,000.  Postop day #3, hemoglobin was 8.4, hematocrit 25.7, and platelets 170,000.  The patient's vital signs were stable and she had no symptomatic side effects secondary to the drop in hemoglobin.  She also was able to participate yesterday with Physical Therapy without any complaints of shortness of breath, dizziness, or lightheadedness, and the physical therapist did not indicate any issues related to anemia.  DISCHARGE INSTRUCTIONS:  ACTIVITY:  The patient is to slowly increase her activity on daily basis.  The patient is to walk with a rolling walker and assistance. She is to use the long-leg immobilizer until physical therapist feels that she has good quad control.  DIET:  No restrictions.  WOUND CARE:  The patient is to change her dressing daily.  She is to keep it dry while showering.  FOLLOWUP:  The patient needs a followup appointment with Dr. Thomasena Edis in 2  weeks for a postop check.  The patient is to call [phone] for that followup appointment. If the patient has any symptomatic issues or medical problems, the patient is to follow up with her medical doctor at [facility].  DISCHARGE MEDICATIONS: 1. Fish oil 2 capsules twice daily. 2. Multivitamins 1 capsule a day. 3. Melatonin 3 mg 1 tablet at night. 4. Fosamax 70 mg 1 tablet a week, may resume. 5. Symbyax 6/25 mg 1 tablet every morning. 6. Potassium 20 mEq 1 tablet a day. 7. Vitamin D2 is 50,000 units once weekly. 8. Simvastatin 40 mg once a day. 9. Alprazolam 0.5 mg 1 tablet twice daily. 10.Levothyroxine 75 mcg 1 tablet a day. 11.Meloxicam 15 mg, to be placed on hold until finished her Lovenox. 12.Fluoxetine 10 mg 1 tablet a day. 13.Lasix 20 mg 1 tablet a day. 14.Enteric-coated aspirin 81 mg once a day. 15.Verapamil SR 240 mg 1.5 tablets every morning. 16.Lovenox 30 mg subcutaneous injection once every 12 hours for a     total of 14 days. 17.Iron 325 mg 1 tablet 3 times a day for a total of 30 days. 18.Percocet 1  or 2 tablets every 4-6 hours for pain if needed. 19.Robaxin 500 mg 1 tablet every 6 hours for muscle spasms if needed.  CONDITION:  The patient's condition upon discharge to home is listed as good and stable.     Jamelle Rushing, P.A.   ______________________________ Erasmo Leventhal, M.D.    RWK/MEDQ  D:  05/05/2010  T:  05/05/2010  Job:  [id number]  cc:   Erasmo Leventhal, M.D. Fax: [phone]  [facility]  Electronically Signed by Arlyn Leak P.A. on 06/04/2010 07:48:58 AM Electronically Signed by Eugenia Mcalpine M.D. on 06/19/2010 09:25:41 AM

## 2010-06-19 NOTE — Op Note (Signed)
Sheila [name], Sheila [name]                  ACCOUNT NO.:  [id number]  MEDICAL RECORD NO.:  [id number]           PATIENT TYPE:  I  LOCATION:  1610                         FACILITY:  [facility]  PHYSICIAN:  Erasmo Leventhal, M.D.DATE OF BIRTH:  [birth year]  DATE OF PROCEDURE: DATE OF DISCHARGE:                              OPERATIVE REPORT   PREOPERATIVE DIAGNOSIS:  Left knee end-stage osteoarthritis.  POSTOPERATIVE DIAGNOSIS:  Left knee end-stage osteoarthritis.  PROCEDURE:  Left total knee arthroplasty.  SURGEON:  Erasmo Leventhal, M.D.  ASSISTANT:  Jamelle Rushing, P.A.  ANESTHESIA:  Femoral nerve block.  ESTIMATED BLOOD LOSS:  Less than 100 cc.  DRAINS:  One Hemovac.  COMPLICATIONS:  None.  TOURNIQUET TIME:  120 minutes at 300 mmHg.  COMPLICATIONS:  None.  DISPOSITION:  To PACU, stable.  IMPLANTS:  [employer], size 2.5 femur, size 2.5 tibia, 50 mm posterior stabilized rotating platform tibial insert and a 35 mm all polyethylene patella, all cemented.  DESCRIPTION OF PROCEDURE:  The patient was counseled in holding area and correct site was identified.  IV was started.  Sedation was given.  The femoral nerve block was administered.  Taken to the operating room and spinal anesthetic was administered.  Foley catheter was placed using sterile technique by the OR circulating nurse.  All extremities were well padded and bumped.  The left leg was examined.  She had a 5 degree flexor contracture.  She could flex to 110 degrees.  She was elevated, prepped with DuraPrep and draped in a sterile fashion.  Exsanguinated with Esmarch and tourniquet was insufflated at 300 mmHg.  Straight midline incision was made in the skin and subcutaneous tissue.  Medial soft tissue flaps were developed at appropriate level.  Medial parapatellar arthrotomy was performed and plus medial soft tissue release was done.  Knee was flexed.  End-stage arthritis changes throughout.   Cruciate ligament was resected.  Starter hole was made in the distal femur.  Canal was irrigated until the effluent was clear. Intramedullary rod was gently placed, chose a 5 degree valgus cut, took a 2 mm cut off distal femur.  Distal  femur was found be a size 2.5. Rotating cutting block was applied.  Rotation was set for 2.5 and a cutting block was applied.  At this point in time, the femur was found to be a 2.  Attention was directed to the tibia.  The medial and lateral meniscus were removed under direct visualization.  Posterior neurovascular structures were protected.  Tibia was subluxed anteriorly. Extramedullary rod was utilized on the tibia.  We took a 2 mm cut off the most deficient side which was the medial side and 0 degree slope. Posteromedial and posterior femoral osteophytes were removed under direct visualization.  Posteromedial tibia osteophytes were removed. Flexion and extension blocks for a size 10 were well balanced.  Knee was then flexed.  The tibia subluxed anteriorly, and found to be a 2.5. Rotation coverage was set for 2.5.  Reamer punch was gently performed.  A box cut was performed.  At this time, a size 2.5  tibia, 2.5 femur, 10 insert, well-balanced flexion and extension gaps, varus and valgus stress.  The patella was found to be a size 35.  Appropriate amount of bone was resected.  Lug holes were placed.  Patella button tracked anatomically. All trials were removed.  The knee was irrigated with pulsatile lavage. Utilizing modern cement technique, all components were cemented in place, size 2.5 femur, size 2.5 tibia with a 35 patella.  After cement had cured, excess cement was removed.  With a 12.5 insert, we were well- balanced flexion and extension gaps, varus and valgus stress, 0 to 36, with 90 degrees flexion, she had excellent alignment.  The knee was subluxed anteriorly and excess cement was removed.  Knee was again irrigated.  Hemostasis was  obtained.  Gelfoam placed posteriorly and final 12.5 mm posterior stabilized rotating platform tibial insert was implanted.  All components were checked, found to be excellent.  A medium Hemovac drain was placed.  The knee was closed in layers. Arthrotomy was closed with 0 Vicryl at 90 degrees of flexion.  Subcu Vicryl, skin with subcu Monocryl suture.  Steri-Strips applied.  Drain was hooked to suction.  Sterile compression dressing was applied. Tourniquet was deflated.  She had normal circulation of foot and ankle at the end of the case.  She tolerated the procedure.  There were no complications or problems.  She was awakened and taken from the operating room to the PACU in stable condition.  To help with surgical technique and decision making, , Mr. Oneida Alar, PA assistance was needed throughout the entire case.          ______________________________ Erasmo Leventhal, M.D.     RAC/MEDQ  D:  05/02/2010  T:  05/02/2010  Job:  [id number]  Electronically Signed by Eugenia Mcalpine M.D. on 06/19/2010 09:25:38 AM

## 2011-01-02 ENCOUNTER — Encounter: Payer: Self-pay | Admitting: *Deleted

## 2011-01-02 ENCOUNTER — Emergency Department (HOSPITAL_COMMUNITY): Payer: Medicare Other

## 2011-01-02 ENCOUNTER — Emergency Department (HOSPITAL_COMMUNITY)
Admission: EM | Admit: 2011-01-02 | Discharge: 2011-01-02 | Disposition: A | Discharge status: Home / Self Care | Payer: Medicare Other | Attending: Emergency Medicine | Admitting: Emergency Medicine

## 2011-01-02 ENCOUNTER — Other Ambulatory Visit: Payer: Self-pay

## 2011-01-02 DIAGNOSIS — Z7982 Long term (current) use of aspirin: Secondary | ICD-10-CM | POA: Insufficient documentation

## 2011-01-02 DIAGNOSIS — Z87891 Personal history of nicotine dependence: Secondary | ICD-10-CM | POA: Insufficient documentation

## 2011-01-02 DIAGNOSIS — I10 Essential (primary) hypertension: Secondary | ICD-10-CM | POA: Insufficient documentation

## 2011-01-02 DIAGNOSIS — E079 Disorder of thyroid, unspecified: Secondary | ICD-10-CM | POA: Insufficient documentation

## 2011-01-02 DIAGNOSIS — R1032 Left lower quadrant pain: Secondary | ICD-10-CM

## 2011-01-02 DIAGNOSIS — F329 Major depressive disorder, single episode, unspecified: Secondary | ICD-10-CM | POA: Insufficient documentation

## 2011-01-02 DIAGNOSIS — F3289 Other specified depressive episodes: Secondary | ICD-10-CM | POA: Insufficient documentation

## 2011-01-02 DIAGNOSIS — K5730 Diverticulosis of large intestine without perforation or abscess without bleeding: Secondary | ICD-10-CM | POA: Insufficient documentation

## 2011-01-02 DIAGNOSIS — E7800 Pure hypercholesterolemia, unspecified: Secondary | ICD-10-CM | POA: Insufficient documentation

## 2011-01-02 HISTORY — DX: Major depressive disorder, single episode, unspecified: F32.9

## 2011-01-02 HISTORY — DX: Depression, unspecified: F32.A

## 2011-01-02 HISTORY — DX: Essential (primary) hypertension: I10

## 2011-01-02 HISTORY — DX: Pure hypercholesterolemia, unspecified: E78.00

## 2011-01-02 LAB — DIFFERENTIAL
Basophils Absolute: 0 10*3/uL (ref 0.0–0.1)
Basophils Relative: 0 % (ref 0–1)
Eosinophils Absolute: 0.3 10*3/uL (ref 0.0–0.7)
Eosinophils Relative: 3 % (ref 0–5)
Lymphocytes Relative: 30 % (ref 12–46)
Lymphs Abs: 3 10*3/uL (ref 0.7–4.0)
Monocytes Absolute: 0.6 10*3/uL (ref 0.1–1.0)
Monocytes Relative: 6 % (ref 3–12)
Neutro Abs: 6.1 10*3/uL (ref 1.7–7.7)
Neutrophils Relative %: 61 % (ref 43–77)

## 2011-01-02 LAB — COMPREHENSIVE METABOLIC PANEL
ALT: 21 U/L (ref 0–35)
AST: 30 U/L (ref 0–37)
Albumin: 4 g/dL (ref 3.5–5.2)
Alkaline Phosphatase: 215 U/L — ABNORMAL HIGH (ref 39–117)
BUN: 15 mg/dL (ref 6–23)
CO2: 28 mEq/L (ref 19–32)
Calcium: 10.3 mg/dL (ref 8.4–10.5)
Chloride: 105 mEq/L (ref 96–112)
Creatinine, Ser: 0.65 mg/dL (ref 0.50–1.10)
GFR calc Af Amer: >90 mL/min (ref >90)
GFR calc non Af Amer: 89 mL/min — ABNORMAL LOW (ref >90)
Glucose, Bld: 94 mg/dL (ref 70–99)
Potassium: 3.5 mEq/L (ref 3.5–5.1)
Sodium: 142 mEq/L (ref 135–145)
Total Bilirubin: 0.5 mg/dL (ref 0.3–1.2)
Total Protein: 7.5 g/dL (ref 6.0–8.3)

## 2011-01-02 LAB — CBC
HCT: 35.4 % — ABNORMAL LOW (ref 36.0–46.0)
Hemoglobin: 11.9 g/dL — ABNORMAL LOW (ref 12.0–15.0)
MCH: 29.5 pg (ref 26.0–34.0)
MCHC: 33.6 g/dL (ref 30.0–36.0)
MCV: 87.6 fL (ref 78.0–100.0)
Platelets: 311 10*3/uL (ref 150–400)
RBC: 4.04 MIL/uL (ref 3.87–5.11)
RDW: 14.1 % (ref 11.5–15.5)
WBC: 10 10*3/uL (ref 4.0–10.5)

## 2011-01-02 LAB — URINALYSIS, ROUTINE W REFLEX MICROSCOPIC
Bilirubin Urine: NEGATIVE
Glucose, UA: NEGATIVE mg/dL
Hgb urine dipstick: NEGATIVE
Ketones, ur: NEGATIVE mg/dL
Leukocytes, UA: NEGATIVE
Nitrite: NEGATIVE
Protein, ur: NEGATIVE mg/dL
Specific Gravity, Urine: 1.01 (ref 1.005–1.030)
Urobilinogen, UA: 0.2 mg/dL (ref 0.0–1.0)
pH: 6 (ref 5.0–8.0)

## 2011-01-02 MED ORDER — HYDROMORPHONE HCL PF 1 MG/ML IJ SOLN
1.0000 mg | Freq: Once | INTRAMUSCULAR | Status: AC
  Administered 2011-01-02: 1 mg via INTRAVENOUS
  Filled 2011-01-02: qty 1

## 2011-01-02 MED ORDER — IOHEXOL 300 MG/ML  SOLN
100.0000 mL | Freq: Once | INTRAMUSCULAR | Status: AC | PRN
  Administered 2011-01-02: 100 mL via INTRAVENOUS

## 2011-01-02 MED ORDER — CIPROFLOXACIN HCL 500 MG PO TABS: 500.0000 mg | ORAL_TABLET | Freq: Two times a day (BID) | ORAL | Status: DC

## 2011-01-02 MED ORDER — OXYCODONE-ACETAMINOPHEN 5-325 MG PO TABS: 2.0000 | ORAL_TABLET | Freq: Four times a day (QID) | ORAL | Status: DC | PRN

## 2011-01-02 MED ORDER — METRONIDAZOLE 500 MG PO TABS: 500.0000 mg | ORAL_TABLET | Freq: Two times a day (BID) | ORAL | Status: AC

## 2011-01-02 MED ORDER — SODIUM CHLORIDE 0.9 % IV BOLUS (SEPSIS)
500.0000 mL | Freq: Once | INTRAVENOUS | Status: AC
  Administered 2011-01-02: 15:00:00 via INTRAVENOUS

## 2011-01-02 MED ORDER — SODIUM CHLORIDE 0.9 % IV SOLN: 1000.0000 mL | INTRAVENOUS | Status: DC

## 2011-01-02 NOTE — ED Provider Notes (Signed)
History     CSN: [id number] Arrival date & time: 01/02/2011 11:09 AM   First MD Initiated Contact with Patient 01/02/11 1345      Chief Complaint  Patient presents with  . Groin Pain    (Consider location/radiation/quality/duration/timing/severity/associated sxs/prior treatment) HPI This 70 year old white female has a 1 month history of constant nonradiating sharp ache in her left lower quadrant which is worse with palpation and movement without associated symptoms other than slightly loose stools for the last week without blood. She has no difficulty urinating no chest pain cough or shortness of breath, no pain to rest her abdomen, she was presumptively started on Cipro and Flagyl one week ago without improvement, to her continued pain 24 hours a day for the last month she comes to the ED for evaluation. Past Medical History  Diagnosis Date  . Hypertension   . Depression   . Hypercholesteremia   . Diverticulitis   . Thyroid disease     Past Surgical History  Procedure Date  . Knee surgery     No family history on file.  History  Substance Use Topics  . Smoking status: Former Games developer  . Smokeless tobacco: Current User  . Alcohol Use: No    OB History    Grav Para Term Preterm Abortions TAB SAB Ect Mult Living                  Review of Systems  Constitutional: Negative for fever.       10 Systems reviewed and are negative for acute change except as noted in the HPI.  HENT: Negative for congestion.   Eyes: Negative for discharge and redness.  Respiratory: Negative for cough and shortness of breath.   Cardiovascular: Negative for chest pain.  Gastrointestinal: Positive for abdominal pain. Negative for nausea, vomiting, diarrhea and blood in stool.  Genitourinary: Negative for dysuria.  Musculoskeletal: Negative for back pain.  Skin: Negative for rash.  Neurological: Negative for syncope, numbness and headaches.  Psychiatric/Behavioral:       No behavior change.      Allergies  Benadryl; Codeine; and Diphenhydramine hcl  Home Medications   Current Outpatient Rx  Name Route Sig Dispense Refill  . ALENDRONATE SODIUM 70 MG PO TABS Oral Take 70 mg by mouth every 7 (seven) days. Take with a full glass of water on an empty stomach.     . ASPIRIN EC 81 MG PO TBEC Oral Take 81 mg by mouth daily.      . ATORVASTATIN CALCIUM 10 MG PO TABS Oral Take 20 mg by mouth daily. Patients bottle was empty and she should have had a week left     . CIPROFLOXACIN HCL 500 MG PO TABS Oral Take 500 mg by mouth 2 (two) times daily.      . FUROSEMIDE 20 MG PO TABS Oral Take 20 mg by mouth daily.      [name] LEVOTHYROXINE SODIUM 75 MCG PO TABS Oral Take 75 mcg by mouth daily.      . MELOXICAM 15 MG PO TABS Oral Take 15 mg by mouth daily. Take with largest meal of day     . METRONIDAZOLE 500 MG PO TABS Oral Take 500 mg by mouth 2 (two) times daily. Finished all medication     . OLANZAPINE-FLUOXETINE HCL 6-25 MG PO CAPS Oral Take 1 capsule by mouth every morning.      [name] POTASSIUM CHLORIDE CRYS CR 20 MEQ PO TBCR Oral Take 20 mEq by  mouth daily.      [name] VERAPAMIL HCL ER 240 MG PO TBCR Oral Take 360 mg by mouth every morning.      [name] CIPROFLOXACIN HCL 500 MG PO TABS Oral Take 1 tablet (500 mg total) by mouth 2 (two) times daily. One po bid x 7 days 14 tablet 0  . METRONIDAZOLE 500 MG PO TABS Oral Take 1 tablet (500 mg total) by mouth 2 (two) times daily. One po bid x 7 days 14 tablet 0  . OXYCODONE-ACETAMINOPHEN 5-325 MG PO TABS Oral Take 2 tablets by mouth every 6 (six) hours as needed for pain. 10 tablet 0    BP 137/56  Pulse 62  Temp(Src) 97.2 F (36.2 C) (Oral)  Resp 20  Ht 5' 1.5" (1.562 m)  Wt 175 lb (79.379 kg)  BMI 32.53 kg/m2  SpO2 98%  Physical Exam  Nursing note and vitals reviewed. Constitutional:       Awake, alert, nontoxic appearance.  HENT:  Head: Atraumatic.  Eyes: Right eye exhibits no discharge. Left eye exhibits no discharge.  Neck: Normal range of  motion. Neck supple.  Cardiovascular: Normal rate and regular rhythm.   No murmur heard. Pulmonary/Chest: Effort normal and breath sounds normal. No respiratory distress. She has no wheezes. She has no rales. She exhibits no tenderness.  Abdominal: Soft. Bowel sounds are normal. She exhibits no mass. There is tenderness. There is no rebound and no guarding.       Mild to moderate localized tenderness to the left lower quadrant only without percussion or rebound tenderness  Musculoskeletal: She exhibits no tenderness.       Baseline ROM, no obvious new focal weakness.  Neurological:       Mental status and motor strength appears baseline for patient and situation.  Skin: No rash noted.  Psychiatric: She has a normal mood and affect.    ED Course  Procedures (including critical care time) Labs and CT scan ordered, Pt & family aware of plan and agree.  Pt stable in ED with no significant deterioration in condition.Patient / Family / Caregiver informed of clinical course, understand medical decision-making process, and agree with plan.  CT scan unremarkable the patient may have improving diverticulitis but I do not think she is to be hospitalized today and the family agrees. Labs Reviewed  CBC - Abnormal; Notable for the following:    Hemoglobin 11.9 (*)    HCT 35.4 (*)    All other components within normal limits  COMPREHENSIVE METABOLIC PANEL - Abnormal; Notable for the following:    Alkaline Phosphatase 215 (*)    GFR calc non Af Amer 89 (*)    All other components within normal limits  URINALYSIS, ROUTINE W REFLEX MICROSCOPIC  DIFFERENTIAL   Ct Abdomen Pelvis W Contrast  01/02/2011  *RADIOLOGY REPORT*  Clinical Data: Left lower quadrant pain.  CT ABDOMEN AND PELVIS WITH CONTRAST  Technique:  Multidetector CT imaging of the abdomen and pelvis was performed following the standard protocol during bolus administration of intravenous contrast.  Contrast: OMNIPAQUE IOHEXOL 300 MG/ML  IV SOLN  Comparison: None.  Findings: Lung bases are unremarkable  The liver, spleen, stomach, in the duodenum, pancreas, gallbladder, adrenal glands, and kidneys are unremarkable.  No abdominal aortic aneurysm.  No evidence for free fluid or lymphadenopathy in the abdomen.  Imaging through the pelvis shows no free intraperitoneal fluid.  No pelvic sidewall lymphadenopathy.  Bladder is unremarkable.  Uterus is normal features.  No adnexal  mass.  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  Terminal ileum is normal.  The appendix is normal.  Bone windows show an insufficiency fracture in the left sacrum. Prominent Schmorl's node is seen in the superior endplate of L4.  IMPRESSION: No acute findings in the abdomen or pelvis.  There is diverticular disease in the sigmoid colon but no CT evidence for diverticulitis.  Left sacral insufficiency fracture.  Original Report Authenticated By: ERIC A. MANSELL, M.D.     1. Abdominal pain, left lower quadrant       MDM  I doubt any other EMC precluding discharge at this time including, but not necessarily limited to the following:peritonitis.        Hurman Horn, MD 01/03/11 [phone]

## 2011-01-02 NOTE — ED Notes (Signed)
Pt finished drinking contrast. CT aware. 

## 2011-01-02 NOTE — ED Notes (Signed)
Pt waiting to be eval by edp 

## 2011-01-02 NOTE — ED Notes (Signed)
edp in with pt 

## 2011-01-02 NOTE — ED Notes (Signed)
Pt repositioned in bed. req something for headache. edp aware

## 2011-01-02 NOTE — ED Notes (Signed)
Family at bedside. 

## 2011-01-02 NOTE — ED Notes (Signed)
Pt states left groin LLQ pain x 1 mo. Family states she was dx. With diverticulits. States she has been seen 3x for same. Took doses of cipro and metronidazole. No relief

## 2011-01-02 NOTE — ED Notes (Signed)
Family member at desk, upset because pt has not been eval. edp aware. Pt made aware that edp would see pt as quick as possible

## 2011-01-02 NOTE — ED Notes (Signed)
Last note erroe in doc

## 2011-01-02 NOTE — ED Notes (Signed)
Pt states she has had her back and belly x-rayed but they have not x-ray what hurts

## 2011-01-02 NOTE — ED Notes (Signed)
Pt waiting for test result 

## 2011-01-05 ENCOUNTER — Emergency Department (HOSPITAL_COMMUNITY): Payer: Medicare Other

## 2011-01-05 ENCOUNTER — Encounter (HOSPITAL_COMMUNITY): Payer: Self-pay | Admitting: Emergency Medicine

## 2011-01-05 ENCOUNTER — Emergency Department (HOSPITAL_COMMUNITY)
Admission: EM | Admit: 2011-01-05 | Discharge: 2011-01-05 | Disposition: A | Discharge status: Home / Self Care | Payer: Medicare Other | Attending: Emergency Medicine | Admitting: Emergency Medicine

## 2011-01-05 DIAGNOSIS — Z7982 Long term (current) use of aspirin: Secondary | ICD-10-CM | POA: Insufficient documentation

## 2011-01-05 DIAGNOSIS — Z87891 Personal history of nicotine dependence: Secondary | ICD-10-CM | POA: Insufficient documentation

## 2011-01-05 DIAGNOSIS — M25552 Pain in left hip: Secondary | ICD-10-CM

## 2011-01-05 DIAGNOSIS — E079 Disorder of thyroid, unspecified: Secondary | ICD-10-CM | POA: Insufficient documentation

## 2011-01-05 DIAGNOSIS — M25559 Pain in unspecified hip: Secondary | ICD-10-CM | POA: Insufficient documentation

## 2011-01-05 DIAGNOSIS — F329 Major depressive disorder, single episode, unspecified: Secondary | ICD-10-CM | POA: Insufficient documentation

## 2011-01-05 DIAGNOSIS — I10 Essential (primary) hypertension: Secondary | ICD-10-CM | POA: Insufficient documentation

## 2011-01-05 DIAGNOSIS — F3289 Other specified depressive episodes: Secondary | ICD-10-CM | POA: Insufficient documentation

## 2011-01-05 DIAGNOSIS — E7800 Pure hypercholesterolemia, unspecified: Secondary | ICD-10-CM | POA: Insufficient documentation

## 2011-01-05 DIAGNOSIS — R109 Unspecified abdominal pain: Secondary | ICD-10-CM | POA: Insufficient documentation

## 2011-01-05 MED ORDER — OXYCODONE-ACETAMINOPHEN 5-325 MG PO TABS: 1.0000 | ORAL_TABLET | ORAL | Status: DC | PRN

## 2011-01-05 MED ORDER — OXYCODONE-ACETAMINOPHEN 5-325 MG PO TABS
1.0000 | ORAL_TABLET | Freq: Once | ORAL | Status: AC
  Administered 2011-01-05: 1 via ORAL
  Filled 2011-01-05: qty 1

## 2011-01-05 NOTE — ED Notes (Signed)
C/o left lower abd pain x 1 month; reports was seen in this ED for same 2 days ago; denies improvement.

## 2011-01-05 NOTE — ED Notes (Signed)
MD at bedside. 

## 2011-01-05 NOTE — ED Notes (Signed)
Pt c/o left lower abd pain and was seen for the same recently and dx with diverticulitis.

## 2011-01-05 NOTE — ED Notes (Signed)
Patient report given to this nurse. Assuming care of patient.  

## 2011-01-05 NOTE — ED Notes (Signed)
Patient back to room from radiology. 

## 2011-01-05 NOTE — ED Notes (Signed)
Patient to radiology via stretcher

## 2011-01-05 NOTE — ED Notes (Signed)
Upon ambulation in [place], patient states she is having 10\10 abdominal pain. Ambulated with assistance from [place], tech, and a cane. No difficulties when ambulating. MD made aware.

## 2011-01-05 NOTE — ED Provider Notes (Signed)
History     CSN: [id number] Arrival date & time: 01/05/2011  6:11 PM   First MD Initiated Contact with Patient 01/05/11 1900    Chief Complaint  Patient presents with  . Abdominal Pain    Patient is a 70 y.o. female presenting with abdominal pain. The history is provided by the patient and a relative.  Abdominal Pain The primary symptoms of the illness include abdominal pain. The primary symptoms of the illness do not include fever, fatigue, shortness of breath, nausea or vomiting. Episode onset: a month ago. The onset of the illness was gradual. The problem has not changed since onset. Associated with: walking. Symptoms associated with the illness do not include chills.  no falls in past week [place] only with movement of left LE and with ambulation Taking abx and pain meds from last ED visit without relief Denies dysuria  Past Medical History  Diagnosis Date  . Hypertension   . Depression   . Hypercholesteremia   . Diverticulitis   . Thyroid disease     Past Surgical History  Procedure Date  . Knee surgery     No family history on file.  History  Substance Use Topics  . Smoking status: Former Games developer  . Smokeless tobacco: Current User  . Alcohol Use: No    OB History    Grav Para Term Preterm Abortions TAB SAB Ect Mult Living                  Review of Systems  Constitutional: Negative for fever, chills and fatigue.  Respiratory: Negative for shortness of breath.   Gastrointestinal: Positive for abdominal pain. Negative for nausea and vomiting.  All other systems reviewed and are negative.    Allergies  Benadryl; Codeine; and Diphenhydramine hcl  Home Medications   Current Outpatient Rx  Name Route Sig Dispense Refill  . ALENDRONATE SODIUM 70 MG PO TABS Oral Take 70 mg by mouth every 7 (seven) days. Take with a full glass of water on an empty stomach.     . ASPIRIN 325 MG PO TABS Oral Take 650 mg by mouth daily.      . ASPIRIN EC 81 MG PO TBEC Oral  Take 162 mg by mouth daily.      . ATORVASTATIN CALCIUM 10 MG PO TABS Oral Take 20 mg by mouth daily. Patients bottle was empty and she should have had a week left     . CIPROFLOXACIN HCL 500 MG PO TABS Oral Take 500 mg by mouth 2 (two) times daily.      . FUROSEMIDE 20 MG PO TABS Oral Take 20 mg by mouth daily.      [name] LEVOTHYROXINE SODIUM 75 MCG PO TABS Oral Take 75 mcg by mouth daily.      . MELOXICAM 15 MG PO TABS Oral Take 15 mg by mouth daily. Take with largest meal of day     . METRONIDAZOLE 500 MG PO TABS Oral Take 1 tablet (500 mg total) by mouth 2 (two) times daily. One po bid x 7 days 14 tablet 0  . NAPROXEN 500 MG PO TABS Oral Take 500 mg by mouth 2 (two) times daily with a meal. For pain     . OLANZAPINE-FLUOXETINE HCL 6-25 MG PO CAPS Oral Take 1 capsule by mouth every morning.      . OXYCODONE-ACETAMINOPHEN 5-325 MG PO TABS Oral Take 2 tablets by mouth every 6 (six) hours as needed for pain. 10 tablet 0  .  POTASSIUM CHLORIDE CRYS CR 20 MEQ PO TBCR Oral Take 20 mEq by mouth daily.      [name] VERAPAMIL HCL ER 240 MG PO TBCR Oral Take 360 mg by mouth every morning.        BP 143/77  Pulse 94  Temp(Src) 98 F (36.7 C) (Oral)  Resp 20  Ht 5' 1.5" (1.562 m)  Wt 175 lb (79.379 kg)  BMI 32.53 kg/m2  SpO2 99%  Physical Exam  CONSTITUTIONAL: Well developed/well nourished HEAD AND FACE: Normocephalic/atraumatic EYES: EOMI/PERRL ENMT: Mucous membranes moist NECK: supple no meningeal signs SPINE:entire spine nontender CV: S1/S2 noted, no murmurs/rubs/gallops noted LUNGS: Lungs are clear to auscultation bilaterally, no apparent distress ABDOMEN: soft, nontender, no rebound or guarding GU:no cva tenderness NEURO: Pt is awake/alert, moves all extremitiesx4 EXTREMITIES: pulses normal, full ROM.  Tender to palpation of left inguinal crease and with ROM of left hip.  No deformity SKIN: warm, color normal PSYCH: no abnormalities of mood noted    ED Course  Procedures    9:10  PM Pt ambulatory with cane She has had this pain for a month abd soft/unremarkable Doubt acute abd process I doubt septic hip or other acute orthopedic injury at this time Advised f/u as outpatient.    MDM  Nursing notes reviewed and considered in documentation Previous records reviewed and considered xrays reviewed and considered         Joya Gaskins, MD 01/05/11 2111

## 2011-01-05 NOTE — ED Notes (Signed)
Into room to see patient. Resting sitting up in bed. Denies any pain. Denies any needs. Notified awaiting radiology results. Verbalized understanding. Call bell and family at bedside.

## 2011-01-05 NOTE — ED Notes (Signed)
Medicated as ordered per MD. Denies any needs at this time. Pain 8/10. No distress. Call bell within reach. Will continue to monitor. Family at bedside.

## 2011-01-13 ENCOUNTER — Other Ambulatory Visit: Payer: Self-pay | Admitting: Family Medicine

## 2011-01-13 ENCOUNTER — Inpatient Hospital Stay (HOSPITAL_COMMUNITY)
Admission: EM | Admit: 2011-01-13 | Discharge: 2011-01-15 | DRG: 378 | Disposition: A | Discharge status: Home / Self Care | Payer: Medicare Other | Attending: Internal Medicine | Admitting: Internal Medicine

## 2011-01-13 ENCOUNTER — Encounter (HOSPITAL_COMMUNITY): Payer: Self-pay | Admitting: Emergency Medicine

## 2011-01-13 DIAGNOSIS — R7989 Other specified abnormal findings of blood chemistry: Secondary | ICD-10-CM | POA: Diagnosis present

## 2011-01-13 DIAGNOSIS — R000 Tachycardia, unspecified: Secondary | ICD-10-CM | POA: Diagnosis present

## 2011-01-13 DIAGNOSIS — D62 Acute posthemorrhagic anemia: Secondary | ICD-10-CM | POA: Diagnosis present

## 2011-01-13 DIAGNOSIS — K254 Chronic or unspecified gastric ulcer with hemorrhage: Principal | ICD-10-CM | POA: Diagnosis present

## 2011-01-13 DIAGNOSIS — E039 Hypothyroidism, unspecified: Secondary | ICD-10-CM | POA: Diagnosis present

## 2011-01-13 DIAGNOSIS — M8448XA Pathological fracture, other site, initial encounter for fracture: Secondary | ICD-10-CM | POA: Diagnosis present

## 2011-01-13 DIAGNOSIS — K5730 Diverticulosis of large intestine without perforation or abscess without bleeding: Secondary | ICD-10-CM | POA: Diagnosis present

## 2011-01-13 DIAGNOSIS — I10 Essential (primary) hypertension: Secondary | ICD-10-CM | POA: Diagnosis present

## 2011-01-13 DIAGNOSIS — R42 Dizziness and giddiness: Secondary | ICD-10-CM | POA: Diagnosis present

## 2011-01-13 DIAGNOSIS — E860 Dehydration: Secondary | ICD-10-CM | POA: Diagnosis present

## 2011-01-13 DIAGNOSIS — IMO0002 Reserved for concepts with insufficient information to code with codable children: Secondary | ICD-10-CM

## 2011-01-13 DIAGNOSIS — I951 Orthostatic hypotension: Secondary | ICD-10-CM | POA: Diagnosis present

## 2011-01-13 DIAGNOSIS — IMO0001 Reserved for inherently not codable concepts without codable children: Secondary | ICD-10-CM | POA: Diagnosis present

## 2011-01-13 DIAGNOSIS — T3995XA Adverse effect of unspecified nonopioid analgesic, antipyretic and antirheumatic, initial encounter: Secondary | ICD-10-CM | POA: Diagnosis present

## 2011-01-13 DIAGNOSIS — D72829 Elevated white blood cell count, unspecified: Secondary | ICD-10-CM | POA: Diagnosis present

## 2011-01-13 DIAGNOSIS — K922 Gastrointestinal hemorrhage, unspecified: Secondary | ICD-10-CM | POA: Diagnosis present

## 2011-01-13 DIAGNOSIS — M1990 Unspecified osteoarthritis, unspecified site: Secondary | ICD-10-CM | POA: Diagnosis present

## 2011-01-13 DIAGNOSIS — G8929 Other chronic pain: Secondary | ICD-10-CM | POA: Diagnosis present

## 2011-01-13 HISTORY — DX: Urinary tract infection, site not specified: N39.0

## 2011-01-13 HISTORY — DX: Diverticulosis of large intestine without perforation or abscess without bleeding: K57.30

## 2011-01-13 HISTORY — DX: Unilateral primary osteoarthritis, unspecified knee: M17.10

## 2011-01-13 HISTORY — DX: Chronic or unspecified gastric ulcer with hemorrhage: K25.4

## 2011-01-13 HISTORY — DX: Osteoarthritis of knee, unspecified: M17.9

## 2011-01-13 HISTORY — DX: Bell's palsy: G51.0

## 2011-01-13 HISTORY — DX: Hypothyroidism, unspecified: E03.9

## 2011-01-13 LAB — COMPREHENSIVE METABOLIC PANEL
ALT: 17 U/L (ref 0–35)
AST: 20 U/L (ref 0–37)
Albumin: 3 g/dL — ABNORMAL LOW (ref 3.5–5.2)
Alkaline Phosphatase: 136 U/L — ABNORMAL HIGH (ref 39–117)
BUN: 47 mg/dL — ABNORMAL HIGH (ref 6–23)
CO2: 25 mEq/L (ref 19–32)
Calcium: 10.4 mg/dL (ref 8.4–10.5)
Chloride: 107 mEq/L (ref 96–112)
Creatinine, Ser: 0.74 mg/dL (ref 0.50–1.10)
GFR calc Af Amer: >90 mL/min (ref >90)
GFR calc non Af Amer: 85 mL/min — ABNORMAL LOW (ref >90)
Glucose, Bld: 115 mg/dL — ABNORMAL HIGH (ref 70–99)
Potassium: 3.8 mEq/L (ref 3.5–5.1)
Sodium: 141 mEq/L (ref 135–145)
Total Bilirubin: 0.4 mg/dL (ref 0.3–1.2)
Total Protein: 6 g/dL (ref 6.0–8.3)

## 2011-01-13 LAB — APTT: aPTT: 28 seconds (ref 24–37)

## 2011-01-13 LAB — DIFFERENTIAL
Basophils Absolute: 0 10*3/uL (ref 0.0–0.1)
Basophils Relative: 0 % (ref 0–1)
Eosinophils Absolute: 0 10*3/uL (ref 0.0–0.7)
Eosinophils Relative: 0 % (ref 0–5)
Lymphocytes Relative: 17 % (ref 12–46)
Lymphs Abs: 2.7 10*3/uL (ref 0.7–4.0)
Monocytes Absolute: 0.8 10*3/uL (ref 0.1–1.0)
Monocytes Relative: 5 % (ref 3–12)
Neutro Abs: 12.8 10*3/uL — ABNORMAL HIGH (ref 1.7–7.7)
Neutrophils Relative %: 79 % — ABNORMAL HIGH (ref 43–77)

## 2011-01-13 LAB — URINALYSIS, ROUTINE W REFLEX MICROSCOPIC
Bilirubin Urine: NEGATIVE
Glucose, UA: NEGATIVE mg/dL
Hgb urine dipstick: NEGATIVE
Ketones, ur: NEGATIVE mg/dL
Leukocytes, UA: NEGATIVE
Nitrite: NEGATIVE
Protein, ur: NEGATIVE mg/dL
Specific Gravity, Urine: 1.01 (ref 1.005–1.030)
Urobilinogen, UA: 0.2 mg/dL (ref 0.0–1.0)
pH: 7 (ref 5.0–8.0)

## 2011-01-13 LAB — CBC
HCT: 24.5 % — ABNORMAL LOW (ref 36.0–46.0)
Hemoglobin: 8.2 g/dL — ABNORMAL LOW (ref 12.0–15.0)
MCH: 29.4 pg (ref 26.0–34.0)
MCHC: 33.5 g/dL (ref 30.0–36.0)
MCV: 87.8 fL (ref 78.0–100.0)
Platelets: 349 10*3/uL (ref 150–400)
RBC: 2.79 MIL/uL — ABNORMAL LOW (ref 3.87–5.11)
RDW: 14.4 % (ref 11.5–15.5)
WBC: 16.3 10*3/uL — ABNORMAL HIGH (ref 4.0–10.5)

## 2011-01-13 LAB — RETICULOCYTES
RBC.: 2.79 MIL/uL — ABNORMAL LOW (ref 3.87–5.11)
Retic Count, Absolute: 61.4 10*3/uL (ref 19.0–186.0)
Retic Ct Pct: 2.2 % (ref 0.4–3.1)

## 2011-01-13 LAB — TSH: TSH: 1.24 u[IU]/mL (ref 0.350–4.500)

## 2011-01-13 LAB — IRON AND TIBC
Iron: 144 ug/dL — ABNORMAL HIGH (ref 42–135)
Saturation Ratios: 44 % (ref 20–55)
TIBC: 330 ug/dL (ref 250–470)
UIBC: 186 ug/dL (ref 125–400)

## 2011-01-13 LAB — PREPARE RBC (CROSSMATCH)

## 2011-01-13 LAB — FOLATE: Folate: >20 ng/mL

## 2011-01-13 LAB — FERRITIN: Ferritin: 36 ng/mL (ref 10–291)

## 2011-01-13 LAB — T4, FREE: Free T4: 1.01 ng/dL (ref 0.80–1.80)

## 2011-01-13 LAB — VITAMIN B12: Vitamin B-12: 385 pg/mL (ref 211–911)

## 2011-01-13 LAB — PROTIME-INR
INR: 1.13 (ref 0.00–1.49)
Prothrombin Time: 14.7 seconds (ref 11.6–15.2)

## 2011-01-13 LAB — ABO/RH: ABO/RH(D): O POS

## 2011-01-13 MED ORDER — PNEUMOCOCCAL VAC POLYVALENT 25 MCG/0.5ML IJ INJ
0.5000 mL | INJECTION | INTRAMUSCULAR | Status: AC
  Filled 2011-01-13: qty 0.5

## 2011-01-13 MED ORDER — INFLUENZA VIRUS VACC SPLIT PF IM SUSP
0.5000 mL | INTRAMUSCULAR | Status: AC
  Filled 2011-01-13: qty 0.5

## 2011-01-13 MED ORDER — OLANZAPINE-FLUOXETINE HCL 6-25 MG PO CAPS
1.0000 | ORAL_CAPSULE | ORAL | Status: DC
  Administered 2011-01-14 – 2011-01-15 (×2): 1 via ORAL
  Filled 2011-01-13 (×3): qty 1

## 2011-01-13 MED ORDER — PANTOPRAZOLE SODIUM 40 MG IV SOLR
40.0000 mg | Freq: Once | INTRAVENOUS | Status: AC
  Administered 2011-01-13: 40 mg via INTRAVENOUS
  Filled 2011-01-13: qty 40

## 2011-01-13 MED ORDER — SODIUM CHLORIDE 0.9 % IV BOLUS (SEPSIS): 1000.0000 mL | Freq: Once | INTRAVENOUS | Status: DC

## 2011-01-13 MED ORDER — LEVOTHYROXINE SODIUM 75 MCG PO TABS
75.0000 ug | ORAL_TABLET | Freq: Every day | ORAL | Status: DC
  Administered 2011-01-15: 75 ug via ORAL
  Filled 2011-01-13 (×3): qty 1

## 2011-01-13 MED ORDER — OXYCODONE HCL 5 MG PO TABS
5.0000 mg | ORAL_TABLET | ORAL | Status: DC | PRN
  Administered 2011-01-13 – 2011-01-14 (×2): 5 mg via ORAL
  Filled 2011-01-13 (×2): qty 1

## 2011-01-13 MED ORDER — ALBUTEROL SULFATE (5 MG/ML) 0.5% IN NEBU: 2.5000 mg | INHALATION_SOLUTION | RESPIRATORY_TRACT | Status: DC | PRN

## 2011-01-13 MED ORDER — ONDANSETRON HCL 4 MG PO TABS
4.0000 mg | ORAL_TABLET | Freq: Four times a day (QID) | ORAL | Status: DC | PRN
  Administered 2011-01-15: 4 mg via ORAL
  Filled 2011-01-13: qty 1

## 2011-01-13 MED ORDER — ACETAMINOPHEN 325 MG PO TABS: 650.0000 mg | ORAL_TABLET | Freq: Four times a day (QID) | ORAL | Status: DC | PRN

## 2011-01-13 MED ORDER — POTASSIUM CHLORIDE IN NACL 20-0.9 MEQ/L-% IV SOLN
INTRAVENOUS | Status: DC
  Administered 2011-01-13 – 2011-01-14 (×2): via INTRAVENOUS
  Administered 2011-01-15: 1000 mL via INTRAVENOUS

## 2011-01-13 MED ORDER — GUAIFENESIN-DM 100-10 MG/5ML PO SYRP: 5.0000 mL | ORAL_SOLUTION | ORAL | Status: DC | PRN

## 2011-01-13 MED ORDER — ONDANSETRON HCL 4 MG/2ML IJ SOLN: 4.0000 mg | Freq: Four times a day (QID) | INTRAMUSCULAR | Status: DC | PRN

## 2011-01-13 MED ORDER — POTASSIUM CHLORIDE CRYS ER 20 MEQ PO TBCR
20.0000 meq | EXTENDED_RELEASE_TABLET | Freq: Every day | ORAL | Status: DC
  Administered 2011-01-14 – 2011-01-15 (×2): 20 meq via ORAL
  Filled 2011-01-13 (×2): qty 1

## 2011-01-13 MED ORDER — ALUM & MAG HYDROXIDE-SIMETH 200-200-20 MG/5ML PO SUSP: 30.0000 mL | Freq: Four times a day (QID) | ORAL | Status: DC | PRN

## 2011-01-13 MED ORDER — SODIUM CHLORIDE 0.9 % IV BOLUS (SEPSIS)
1000.0000 mL | Freq: Once | INTRAVENOUS | Status: AC
  Administered 2011-01-13: 1000 mL via INTRAVENOUS

## 2011-01-13 MED ORDER — PANTOPRAZOLE SODIUM 40 MG IV SOLR
40.0000 mg | Freq: Two times a day (BID) | INTRAVENOUS | Status: DC
  Administered 2011-01-14 (×2): 40 mg via INTRAVENOUS
  Filled 2011-01-13 (×3): qty 40

## 2011-01-13 MED ORDER — SIMVASTATIN 20 MG PO TABS
20.0000 mg | ORAL_TABLET | Freq: Every day | ORAL | Status: DC
  Filled 2011-01-13 (×2): qty 1

## 2011-01-13 MED ORDER — PANTOPRAZOLE SODIUM 40 MG IV SOLR: 40.0000 mg | INTRAVENOUS | Status: DC

## 2011-01-13 MED ORDER — ACETAMINOPHEN 650 MG RE SUPP: 650.0000 mg | Freq: Four times a day (QID) | RECTAL | Status: DC | PRN

## 2011-01-13 NOTE — ED Provider Notes (Addendum)
History  Scribed for Benny Lennert, MD, the patient was seen in room APA08. This chart was scribed by Hillery Hunter. Time seen: 15:35   CSN: [id number] Arrival date & time: 01/13/2011  3:03 PM   First MD Initiated Contact with Patient 01/13/11 1532      Chief Complaint  Patient presents with  . Fatigue  . Nausea  . Dizziness    only when standing.    The history is provided by the patient and a relative.   Sheila [name] is a 70 y.o. female who presents to the Emergency Department complaining of blurred vision and dizziness while standing that started today after using the bathroom. Pt reports associated symptoms of two loose bowel movements today. She states that she believes she might have noticed blood in her stools but that her blurred vision made it difficult for her to be sure. The family member present, however, denies that she has noticed blood in the patient's stool. The patient denies associated fever, cough, sore throat, diarrhea, abdominal pain and vomiting. Dizziness is worse while standing and symptoms are better when laying flat and with rest.  PCP: Allie Dimmer, OTR    Past Medical History  Diagnosis Date  . Hypertension   . Depression   . Hypercholesteremia   . Diverticulosis of colon   . Hypothyroidism   . Chest pain 07/2009    Myoveiw stress test negative.  [name] DJD (degenerative joint disease) of knee   . Bell's palsy   . UTI (lower urinary tract infection)     Past Surgical History  Procedure Date  . Knee surgery 04/2010.    Total left knee replacement    History reviewed. No pertinent family history.  History  Substance Use Topics  . Smoking status: Former Games developer  . Smokeless tobacco: Current User  . Alcohol Use: No     Review of Systems  Constitutional: Negative for fever.  HENT: Negative for sore throat.   Respiratory: Negative for cough.   Gastrointestinal: Positive for nausea, diarrhea (loose stools today) and blood  in stool (equivocal). Negative for vomiting and abdominal pain.  All other systems reviewed and are negative.    Allergies  Benadryl; Codeine; Diphenhydramine hcl; and Morphine and related  Home Medications   Current Outpatient Rx  Name Route Sig Dispense Refill  . ALENDRONATE SODIUM 70 MG PO TABS Oral Take 70 mg by mouth every 7 (seven) days. Take with a full glass of water on an empty stomach.     . ASPIRIN 325 MG PO TABS Oral Take 650 mg by mouth daily.      . ASPIRIN EC 81 MG PO TBEC Oral Take 162 mg by mouth daily.      . ATORVASTATIN CALCIUM 10 MG PO TABS Oral Take 20 mg by mouth daily. Patients bottle was empty and she should have had a week left     . CIPROFLOXACIN HCL 500 MG PO TABS Oral Take 500 mg by mouth 2 (two) times daily.      . FUROSEMIDE 20 MG PO TABS Oral Take 20 mg by mouth daily.      [name] LEVOTHYROXINE SODIUM 75 MCG PO TABS Oral Take 75 mcg by mouth daily.      . MELOXICAM 15 MG PO TABS Oral Take 15 mg by mouth daily. Take with largest meal of day     . METRONIDAZOLE 500 MG PO TABS Oral Take 1 tablet (500 mg total) by mouth 2 (two)  times daily. One po bid x 7 days 14 tablet 0  . NAPROXEN 500 MG PO TABS Oral Take 500 mg by mouth 2 (two) times daily with a meal. For pain     . OLANZAPINE-FLUOXETINE HCL 6-25 MG PO CAPS Oral Take 1 capsule by mouth every morning.      . OXYCODONE-ACETAMINOPHEN 5-325 MG PO TABS Oral Take 1 tablet by mouth every 4 (four) hours as needed for pain. 15 tablet 0  . POTASSIUM CHLORIDE CRYS CR 20 MEQ PO TBCR Oral Take 20 mEq by mouth daily.      [name] VERAPAMIL HCL ER 240 MG PO TBCR Oral Take 360 mg by mouth every morning.        Triage vitals: BP 105/56  Pulse 116  Temp(Src) 98.8 F (37.1 C) (Oral)  Resp 18  SpO2 96%  Physical Exam  Nursing note and vitals reviewed. Constitutional: She is oriented to person, place, and time. No distress.       General weakness  HENT:       Dry mucus membranes   Cardiovascular: Normal rate, regular rhythm  and normal heart sounds.   Pulmonary/Chest: Effort normal. No respiratory distress.  Abdominal: Soft. She exhibits no distension. There is no tenderness. There is no rebound and no guarding.       obese  Genitourinary:       Dark red blood in stool   Musculoskeletal: She exhibits no edema and no tenderness.  Neurological: She is alert and oriented to person, place, and time.  Skin: Skin is warm and dry. There is pallor.  Psychiatric: She has a normal mood and affect. Her behavior is normal.    ED Course  Procedures   Labs Reviewed  CBC - Abnormal; Notable for the following:    WBC 16.3 (*)    RBC 2.79 (*)    Hemoglobin 8.2 (*)    HCT 24.5 (*)    All other components within normal limits  DIFFERENTIAL - Abnormal; Notable for the following:    Neutrophils Relative 79 (*)    Neutro Abs 12.8 (*)    All other components within normal limits  COMPREHENSIVE METABOLIC PANEL - Abnormal; Notable for the following:    Glucose, Bld 115 (*)    BUN 47 (*)    Albumin 3.0 (*)    Alkaline Phosphatase 136 (*)    GFR calc non Af Amer 85 (*)    All other components within normal limits  TYPE AND SCREEN  PREPARE RBC (CROSSMATCH)  ABO/RH      OTHER DATA REVIEWED: Nursing notes, vital signs, and past medical records reviewed.   DIAGNOSTIC STUDIES: Oxygen Saturation is 96% on room air, normal by my interpretation.     ED COURSE / COORDINATION OF CARE: 15:48. Ordered: CBC ; Differential ; Comprehensive metabolic panel ; Type and screen ; sodium chloride 0.9 % bolus 1,000 mL ; sodium chloride 0.9 % bolus 1,000 mL ; Nursing communication ; pantoprazole (PROTONIX) injection 40 mg  16:50. Review of prior records shows patient's hemoglobin measured at 12 ten days ago, and is at 8 according to present labs. Rechecked patient who feels fine currently while laying flat. Discussed test findings (low H&H) with patient and family at bedside, as well as plan for admission. Patient and family is  amenable to plan.    MDM     1. GI bleed    The chart was scribed for me under my direct supervision.  I personally performed  the history, physical, and medical decision making and all procedures in the evaluation of this patient.Benny Lennert, MD 01/13/11 1701  Benny Lennert, MD 01/13/11 [phone]

## 2011-01-13 NOTE — ED Notes (Signed)
Raynelle Fanning R.N. Notified of vital signs and difference in ortho v/s.

## 2011-01-13 NOTE — H&P (Signed)
Sheila [name] MRN: [id number] DOB/AGE: 1940/04/30 70 y.o. Primary Care Physician:Steadman, Grayce Sessions, OTR, OTR Admit date: 01/13/2011 Chief Complaint: Generalized weakness, lightheadedness, and bloody stools. HPI: The patient is a 70 year old woman with a past medical history significant for hypothyroidism, hypertension, depression, Bell's palsy, and degenerative joint disease. She presents to the emergency department today with a chief complaint of lightheadedness, generalized weakness, and black colored stools. She actually presented to the emergency department on 01/02/2011 for a complaint of left-sided groin pain. A CT scan of her abdomen and pelvis was ordered and it revealed, among other things, diverticulosis, superior endplate node at L4, and left sacral insufficiency fracture. She was prescribed analgesics and discharged to home. At that time, her hemoglobin was 11.9. She presented to the emergency department again on 01/05/2011 with a chief complaint of abdominal pain. She was thought to have diverticulitis clinically, and therefore, she was discharged from the ER on Flagyl and Cipro. This morning, she had 3 loose stools some of which were black in color. Because she had become lightheaded and had some visual changes particularly when standing, she could not tell if there was bright red blood in her stools. She had some nausea, but she did not vomit. Her abdominal pain from last week, has subsided, however, she continues to complain of left-sided groin pain.  In the emergency department, the patient's blood pressure is 110/63 and her heart rate is 103. Her blood pressure was initially 83/49 and her heart rate was initially 124. Her lab data are significant for a WBC of 16.3, hemoglobin of 8.2, and BUN of 47. She is being admitted for further evaluation and management.  Past Medical History  Diagnosis Date  . Hypertension   . Depression   . Hypercholesteremia   . Diverticulosis of colon   .  Hypothyroidism   . Chest pain 07/2009    Myoveiw stress test negative.  [name] DJD (degenerative joint disease) of knee   . Bell's palsy   . UTI (lower urinary tract infection)     Past Surgical History  Procedure Date  . Knee surgery 04/2010.    Total left knee replacement    Prior to Admission medications   Medication Sig Start Date End Date Taking? Authorizing Provider  alendronate (FOSAMAX) 70 MG tablet Take 70 mg by mouth every 7 (seven) days. Take with a full glass of water on an empty stomach.     Historical Provider, MD  aspirin 325 MG tablet Take 650 mg by mouth daily.      Historical Provider, MD  aspirin EC 81 MG tablet Take 162 mg by mouth daily.      Historical Provider, MD  atorvastatin (LIPITOR) 10 MG tablet Take 20 mg by mouth daily. Patients bottle was empty and she should have had a week left     Historical Provider, MD  furosemide (LASIX) 20 MG tablet Take 20 mg by mouth daily.      Historical Provider, MD  levothyroxine (SYNTHROID, LEVOTHROID) 75 MCG tablet Take 75 mcg by mouth daily.      Historical Provider, MD  meloxicam (MOBIC) 15 MG tablet Take 15 mg by mouth daily. Take with largest meal of day     Historical Provider, MD  metroNIDAZOLE (FLAGYL) 500 MG tablet Take 1 tablet (500 mg total) by mouth 2 (two) times daily. One po bid x 7 days 01/02/11 01/12/11  Hurman Horn, MD  naproxen (NAPROSYN) 500 MG tablet Take 500 mg by mouth 2 (two)  times daily with a meal. For pain     Historical Provider, MD  olanzapine-FLUoxetine (SYMBYAX) 6-25 MG per capsule Take 1 capsule by mouth every morning.      Historical Provider, MD  oxyCODONE-acetaminophen (PERCOCET) 5-325 MG per tablet Take 1 tablet by mouth every 4 (four) hours as needed for pain. 01/05/11 01/15/11  Joya Gaskins, MD  potassium chloride SA (K-DUR,KLOR-CON) 20 MEQ tablet Take 20 mEq by mouth daily.      Historical Provider, MD  verapamil (CALAN-SR) 240 MG CR tablet Take 360 mg by mouth every morning.      Historical  Provider, MD    Allergies:  Allergies  Allergen Reactions  . Benadryl (Altaryl)   . Codeine   . Diphenhydramine Hcl   . Morphine And Related     Family history: Her mother died of gallbladder infection. Her father died of lung cancer.  Social History: The patient is widowed. She lives in [place]. She has 2 adult children. She denies tobacco, alcohol, and illicit drug use. She no longer drives. She is a retired Futures trader.    ROS: As above in the history of present illness. In addition, her review of systems is positive for blurred vision when standing, chills, arthritic pain in her knees, nausea, and black colored stools. Otherwise her review of systems is negative.  PHYSICAL EXAM: Blood pressure 110/63, pulse 103, temperature 98.8 F (37.1 C), temperature source Oral, resp. rate 16, SpO2 99.00%. General: The patient is a pleasant obese 70 year old Caucasian woman who is currently lying in bed in no acute distress. HEENT: Head is normocephalic, nontraumatic. Pupils are equal, round, and reactive to light. Extraocular movements are intact. Sclerae are clear. Conjunctivae are white. Oropharynx reveals dry mucous membranes. No posterior exudates or erythema. Neck: Supple, obese, possible thyromegaly versus obese neck, no palpable lymph nodes, no JVD, no bruit. Lungs: Clear to auscultation bilaterally. Heart: S1, S2, with borderline tachycardia and a soft systolic murmur. Abdomen: Obese, positive bowel sounds, nontender, nondistended. Rectal: Per the emergency department physician, her stool was dark red and guaiac positive. Extremities: Pedal pulses palpable bilaterally. No pedal edema and no pretibial edema. Well-healed left knee scar. Neurologic: The patient is alert and oriented x3. Cranial nerves II through XII are intact. Strength of 5 over 5 throughout.   Basic Metabolic Panel:  Basename 01/13/11 1600  NA 141  K 3.8  CL 107  CO2 25  GLUCOSE 115*  BUN 47*  CREATININE 0.74    CALCIUM 10.4  MG --  PHOS --   Liver Function Tests:  Basename 01/13/11 1600  AST 20  ALT 17  ALKPHOS 136*  BILITOT 0.4  PROT 6.0  ALBUMIN 3.0*   No results found for this basename: LIPASE:2,AMYLASE:2 in the last 72 hours No results found for this basename: AMMONIA:2 in the last 72 hours CBC:  Basename 01/13/11 1600  WBC 16.3*  NEUTROABS 12.8*  HGB 8.2*  HCT 24.5*  MCV 87.8  PLT 349   Cardiac Enzymes: No results found for this basename: CKTOTAL:3,CKMB:3,CKMBINDEX:3,TROPONINI:3 in the last 72 hours BNP: No results found for this basename: POCBNP:3 in the last 72 hours D-Dimer: No results found for this basename: DDIMER:2 in the last 72 hours CBG: No results found for this basename: GLUCAP:6 in the last 72 hours Hemoglobin A1C: No results found for this basename: HGBA1C in the last 72 hours Fasting Lipid Panel: No results found for this basename: CHOL,HDL,LDLCALC,TRIG,CHOLHDL,LDLDIRECT in the last 72 hours Thyroid Function Tests: No results  found for this basename: TSH,T4TOTAL,FREET4,T3FREE,THYROIDAB in the last 72 hours Anemia Panel: No results found for this basename: VITAMINB12,FOLATE,FERRITIN,TIBC,IRON,RETICCTPCT in the last 72 hours Coagulation: No results found for this basename: LABPROT:2,INR:2 in the last 72 hours Urine Drug Screen:  Alcohol Level: No results found for this basename: ETH:2 in the last 72 hours Urinalysis:  Misc. Labs:   No results found for this or any previous visit (from the past 240 hour(s)).   Results for orders placed during the hospital encounter of 01/13/11 (from the past 48 hour(s))  CBC     Status: Abnormal   Collection Time   01/13/11  4:00 PM      Component Value Range Comment   WBC 16.3 (*) 4.0 - 10.5 (K/uL)    RBC 2.79 (*) 3.87 - 5.11 (MIL/uL)    Hemoglobin 8.2 (*) 12.0 - 15.0 (g/dL)    HCT 16.1 (*) 09.6 - 46.0 (%)    MCV 87.8  78.0 - 100.0 (fL)    MCH 29.4  26.0 - 34.0 (pg)    MCHC 33.5  30.0 - 36.0 (g/dL)     RDW 04.5  40.9 - 81.1 (%)    Platelets 349  150 - 400 (K/uL)   DIFFERENTIAL     Status: Abnormal   Collection Time   01/13/11  4:00 PM      Component Value Range Comment   Neutrophils Relative 79 (*) 43 - 77 (%)    Neutro Abs 12.8 (*) 1.7 - 7.7 (K/uL)    Lymphocytes Relative 17  12 - 46 (%)    Lymphs Abs 2.7  0.7 - 4.0 (K/uL)    Monocytes Relative 5  3 - 12 (%)    Monocytes Absolute 0.8  0.1 - 1.0 (K/uL)    Eosinophils Relative 0  0 - 5 (%)    Eosinophils Absolute 0.0  0.0 - 0.7 (K/uL)    Basophils Relative 0  0 - 1 (%)    Basophils Absolute 0.0  0.0 - 0.1 (K/uL)   COMPREHENSIVE METABOLIC PANEL     Status: Abnormal   Collection Time   01/13/11  4:00 PM      Component Value Range Comment   Sodium 141  135 - 145 (mEq/L)    Potassium 3.8  3.5 - 5.1 (mEq/L)    Chloride 107  96 - 112 (mEq/L)    CO2 25  19 - 32 (mEq/L)    Glucose, Bld 115 (*) 70 - 99 (mg/dL)    BUN 47 (*) 6 - 23 (mg/dL)    Creatinine, Ser 9.14  0.50 - 1.10 (mg/dL)    Calcium 78.2  8.4 - 10.5 (mg/dL)    Total Protein 6.0  6.0 - 8.3 (g/dL)    Albumin 3.0 (*) 3.5 - 5.2 (g/dL)    AST 20  0 - 37 (U/L)    ALT 17  0 - 35 (U/L)    Alkaline Phosphatase 136 (*) 39 - 117 (U/L)    Total Bilirubin 0.4  0.3 - 1.2 (mg/dL)    GFR calc non Af Amer 85 (*) >90 (mL/min)    GFR calc Af Amer >90  >90 (mL/min)   TYPE AND SCREEN     Status: Normal (Preliminary result)   Collection Time   01/13/11  4:00 PM      Component Value Range Comment   ABO/RH(D) O POS      Antibody Screen NEG      Sample Expiration 01/16/2011  Unit Number [id number]      Blood Component Type RED CELLS,LR      Unit division 00      Status of Unit ALLOCATED      Transfusion Status OK TO TRANSFUSE      Crossmatch Result Compatible      Unit Number [id number]      Blood Component Type RED CELLS,LR      Unit division 00      Status of Unit ALLOCATED      Transfusion Status OK TO TRANSFUSE      Crossmatch Result Compatible     PREPARE RBC (CROSSMATCH)      Status: Normal   Collection Time   01/13/11  4:00 PM      Component Value Range Comment   Order Confirmation ORDER PROCESSED BY BLOOD BANK     ABO/RH     Status: Normal   Collection Time   01/13/11  4:00 PM      Component Value Range Comment   ABO/RH(D) O POS       Dg Hip Complete Left  01/05/2011  *RADIOLOGY REPORT*  Clinical Data: Left hip pain following a fall 2 weeks ago.  LEFT HIP - COMPLETE 2+ VIEW  Comparison: None.  Findings: Diffuse osteopenia.  No fracture or dislocation seen.  IMPRESSION: No fracture.  Original Report Authenticated By: Darrol Angel, M.D.   Ct Abdomen Pelvis W Contrast  01/02/2011  *RADIOLOGY REPORT*  Clinical Data: Left lower quadrant pain.  CT ABDOMEN AND PELVIS WITH CONTRAST  Technique:  Multidetector CT imaging of the abdomen and pelvis was performed following the standard protocol during bolus administration of intravenous contrast.  Contrast: OMNIPAQUE IOHEXOL 300 MG/ML IV SOLN  Comparison: None.  Findings: Lung bases are unremarkable  The liver, spleen, stomach, in the duodenum, pancreas, gallbladder, adrenal glands, and kidneys are unremarkable.  No abdominal aortic aneurysm.  No evidence for free fluid or lymphadenopathy in the abdomen.  Imaging through the pelvis shows no free intraperitoneal fluid.  No pelvic sidewall lymphadenopathy.  Bladder is unremarkable.  Uterus is normal features.  No adnexal mass.  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  Terminal ileum is normal.  The appendix is normal.  Bone windows show an insufficiency fracture in the left sacrum. Prominent Schmorl's node is seen in the superior endplate of L4.  IMPRESSION: No acute findings in the abdomen or pelvis.  There is diverticular disease in the sigmoid colon but no CT evidence for diverticulitis.  Left sacral insufficiency fracture.  Original Report Authenticated By: ERIC A. MANSELL, M.D.    Impression:  Active Problems:  GI  bleeding  Diverticulosis of colon  Anemia associated with acute blood loss  Prerenal azotemia  Sinus tachycardia  Hypotension due to blood loss  Leukocytosis  Sacral insufficiency fracture  Groin pain, left lower quadrant  Hypothyroidism  Orthostatic lightheadedness  1. GI bleeding. Given her history of diverticulosis seen on the CT scan one week ago, it is possible that she may have a diverticular bleed. However, given her NSAID use, she may have peptic ulcer disease. Of note, the patient was asked if she took in the aspirin-like products in addition to her daily aspirin and she answers no. However in review of her medication list, she is taking meloxicam and naproxen as needed.  Acute blood loss anemia. Her hemoglobin 1-1/2 weeks ago was 11.9. Today it is 8.2. She needs transfusing of packed red blood cells  in the setting of hypotension initially.  Hypotension and tachycardia. Both secondary to dehydration and blood loss. Both resolved with 1 L of IV fluids in the emergency department.  Prerenal azotemia secondary to GI blood losses. Next  Orthostatic lightheadedness and visual changes. Neurologically she is intact on exam.  Leukocytosis. This is likely reactive. A urinalysis will be ordered for evaluation of infection.  Left groin pain, secondary to sacral insufficiency fracture. The patient had a fall approximately one month ago.  Hypothyroidism. She is treated chronically with replacement therapy.     Plan:  1. Continue IV fluid hydration. We'll transfuse 2 units of packed red blood cells. We'll follow her CBC every 6 hours. Gastroenterologist, Dr. Dionicia Abler has been consulted.  We'll start IV Protonix every 12 hours. We'll discontinue Flagyl and Cipro as there does not appear to be any evidence of diverticulitis. Will hold verapamil. We'll discontinue naproxen and Mobic. For further evaluation, we'll check an anemia panel, urinalysis and culture, and other laboratory  studies.      Giselle Brutus 01/13/2011, 6:17 PM

## 2011-01-13 NOTE — ED Notes (Signed)
Pt states when she woke up this am she felt generalized weakness with nausea and dizziness with standing. Pt states went back to bed and when she woke up still felt bad. Pt states after bowel movement thought she saw tarry blood in stool.

## 2011-01-13 NOTE — ED Notes (Signed)
Advised Nurse - stool was dark red tarry - very thick

## 2011-01-14 ENCOUNTER — Encounter (HOSPITAL_COMMUNITY)
Admission: EM | Disposition: A | Discharge status: Home / Self Care | Payer: Self-pay | Source: Home / Self Care | Attending: Internal Medicine

## 2011-01-14 ENCOUNTER — Encounter (HOSPITAL_COMMUNITY): Payer: Self-pay

## 2011-01-14 DIAGNOSIS — K254 Chronic or unspecified gastric ulcer with hemorrhage: Secondary | ICD-10-CM

## 2011-01-14 DIAGNOSIS — D509 Iron deficiency anemia, unspecified: Secondary | ICD-10-CM

## 2011-01-14 DIAGNOSIS — K922 Gastrointestinal hemorrhage, unspecified: Secondary | ICD-10-CM

## 2011-01-14 DIAGNOSIS — K259 Gastric ulcer, unspecified as acute or chronic, without hemorrhage or perforation: Secondary | ICD-10-CM

## 2011-01-14 HISTORY — PX: ESOPHAGOGASTRODUODENOSCOPY: SHX5428

## 2011-01-14 HISTORY — DX: Chronic or unspecified gastric ulcer with hemorrhage: K25.4

## 2011-01-14 LAB — CBC
HCT: 26.6 % — ABNORMAL LOW (ref 36.0–46.0)
HCT: 26.7 % — ABNORMAL LOW (ref 36.0–46.0)
HCT: 29.4 % — ABNORMAL LOW (ref 36.0–46.0)
HCT: 26.8 % — ABNORMAL LOW (ref 36.0–46.0)
Hemoglobin: 9.1 g/dL — ABNORMAL LOW (ref 12.0–15.0)
Hemoglobin: 9.1 g/dL — ABNORMAL LOW (ref 12.0–15.0)
Hemoglobin: 9.9 g/dL — ABNORMAL LOW (ref 12.0–15.0)
Hemoglobin: 9.2 g/dL — ABNORMAL LOW (ref 12.0–15.0)
MCH: 29.9 pg (ref 26.0–34.0)
MCH: 29.9 pg (ref 26.0–34.0)
MCH: 29.7 pg (ref 26.0–34.0)
MCH: 30.2 pg (ref 26.0–34.0)
MCHC: 34.2 g/dL (ref 30.0–36.0)
MCHC: 34.1 g/dL (ref 30.0–36.0)
MCHC: 33.7 g/dL (ref 30.0–36.0)
MCHC: 34.3 g/dL (ref 30.0–36.0)
MCV: 87.5 fL (ref 78.0–100.0)
MCV: 87.8 fL (ref 78.0–100.0)
MCV: 88.3 fL (ref 78.0–100.0)
MCV: 87.9 fL (ref 78.0–100.0)
Platelets: 267 10*3/uL (ref 150–400)
Platelets: 249 10*3/uL (ref 150–400)
Platelets: 248 10*3/uL (ref 150–400)
Platelets: 248 10*3/uL (ref 150–400)
RBC: 3.04 MIL/uL — ABNORMAL LOW (ref 3.87–5.11)
RBC: 3.04 MIL/uL — ABNORMAL LOW (ref 3.87–5.11)
RBC: 3.33 MIL/uL — ABNORMAL LOW (ref 3.87–5.11)
RBC: 3.05 MIL/uL — ABNORMAL LOW (ref 3.87–5.11)
RDW: 14.2 % (ref 11.5–15.5)
RDW: 14.6 % (ref 11.5–15.5)
RDW: 15 % (ref 11.5–15.5)
RDW: 15.1 % (ref 11.5–15.5)
WBC: 12.7 10*3/uL — ABNORMAL HIGH (ref 4.0–10.5)
WBC: 11.7 10*3/uL — ABNORMAL HIGH (ref 4.0–10.5)
WBC: 9.8 10*3/uL (ref 4.0–10.5)
WBC: 8.7 10*3/uL (ref 4.0–10.5)

## 2011-01-14 LAB — COMPREHENSIVE METABOLIC PANEL
ALT: 14 U/L (ref 0–35)
AST: 15 U/L (ref 0–37)
Albumin: 2.8 g/dL — ABNORMAL LOW (ref 3.5–5.2)
Alkaline Phosphatase: 113 U/L (ref 39–117)
BUN: 31 mg/dL — ABNORMAL HIGH (ref 6–23)
CO2: 24 mEq/L (ref 19–32)
Calcium: 9.4 mg/dL (ref 8.4–10.5)
Chloride: 110 mEq/L (ref 96–112)
Creatinine, Ser: 0.7 mg/dL (ref 0.50–1.10)
GFR calc Af Amer: >90 mL/min (ref >90)
GFR calc non Af Amer: 86 mL/min — ABNORMAL LOW (ref >90)
Glucose, Bld: 88 mg/dL (ref 70–99)
Potassium: 3.7 mEq/L (ref 3.5–5.1)
Sodium: 142 mEq/L (ref 135–145)
Total Bilirubin: 1.1 mg/dL (ref 0.3–1.2)
Total Protein: 5.4 g/dL — ABNORMAL LOW (ref 6.0–8.3)

## 2011-01-14 SURGERY — EGD (ESOPHAGOGASTRODUODENOSCOPY)
Anesthesia: Moderate Sedation

## 2011-01-14 MED ORDER — MEPERIDINE HCL 25 MG/ML IJ SOLN
INTRAMUSCULAR | Status: DC | PRN
  Administered 2011-01-14 (×2): 25 mg via INTRAVENOUS

## 2011-01-14 MED ORDER — MIDAZOLAM HCL 5 MG/5ML IJ SOLN
INTRAMUSCULAR | Status: DC | PRN
  Administered 2011-01-14 (×2): 2 mg via INTRAVENOUS
  Administered 2011-01-14 (×2): 1 mg via INTRAVENOUS

## 2011-01-14 MED ORDER — MIDAZOLAM HCL 5 MG/5ML IJ SOLN
INTRAMUSCULAR | Status: AC
  Filled 2011-01-14: qty 10

## 2011-01-14 MED ORDER — ALPRAZOLAM 0.5 MG PO TABS
0.5000 mg | ORAL_TABLET | Freq: Once | ORAL | Status: AC
  Administered 2011-01-15: 0.5 mg via ORAL
  Filled 2011-01-14: qty 1

## 2011-01-14 MED ORDER — STERILE WATER FOR IRRIGATION IR SOLN
Status: DC | PRN
  Administered 2011-01-14: 11:00:00

## 2011-01-14 MED ORDER — ROSUVASTATIN CALCIUM 5 MG PO TABS
5.0000 mg | ORAL_TABLET | Freq: Every day | ORAL | Status: DC
  Administered 2011-01-14: 5 mg via ORAL
  Filled 2011-01-14: qty 1

## 2011-01-14 MED ORDER — VERAPAMIL HCL ER 180 MG PO TBCR
180.0000 mg | EXTENDED_RELEASE_TABLET | Freq: Every day | ORAL | Status: DC
  Administered 2011-01-14 – 2011-01-15 (×2): 180 mg via ORAL
  Filled 2011-01-14 (×2): qty 1

## 2011-01-14 MED ORDER — MEPERIDINE HCL 50 MG/ML IJ SOLN
INTRAMUSCULAR | Status: AC
  Filled 2011-01-14: qty 1

## 2011-01-14 MED ORDER — BUTAMBEN-TETRACAINE-BENZOCAINE 2-2-14 % EX AERO
INHALATION_SPRAY | CUTANEOUS | Status: DC | PRN
  Administered 2011-01-14: 2 via TOPICAL

## 2011-01-14 NOTE — Progress Notes (Signed)
Physical Therapy Evaluation Patient Details Name: Sheila [name] MRN: [id number] DOB: May 22, 1940 Today's Date: 01/14/2011  Problem List:  Patient Active Problem List  Diagnoses  . CHEST PAIN-PRECORDIAL  . ABNORMAL EKG  . GI bleeding  . Diverticulosis of colon  . Anemia associated with acute blood loss  . Prerenal azotemia  . Sinus tachycardia  . Hypotension due to blood loss  . Leukocytosis  . Sacral insufficiency fracture  . Groin pain, left lower quadrant  . Hypothyroidism  . Orthostatic lightheadedness    Past Medical History:  Past Medical History  Diagnosis Date  . Hypertension   . Depression   . Hypercholesteremia   . Diverticulosis of colon   . Hypothyroidism   . Chest pain 07/2009    Myoveiw stress test negative.  [name] DJD (degenerative joint disease) of knee   . Bell's palsy   . UTI (lower urinary tract infection)    Past Surgical History:  Past Surgical History  Procedure Date  . Knee surgery 04/2010.    Total left knee replacement    PT Assessment/Plan/Recommendation PT Assessment Clinical Impression Statement: pt just back from enoscopy but willing to work with [place]...is recovering from sacral fx and is assisted by family at home...she appears very close to prior functional level and see no specific PT or SME needs...family is managing well at home PT Recommendation/Assessment: Patent does not need any further PT services No Skilled PT: Patient at baseline level of functioning;Patient will have necessary level of assist by caregiver at discharge PT Goals     PT Evaluation Precautions/Restrictions  Precautions Required Braces or Orthoses: No Restrictions Weight Bearing Restrictions: No Prior Functioning  Home Living Lives With: Alone Receives Help From: Family Type of Home: Apartment Home Layout: One level Home Access: Level entry Bathroom Shower/Tub: Engineer, manufacturing systems: Standard Bathroom Accessibility: Yes How Accessible: Accessible  via walker Home Adaptive Equipment: Walker - rolling Prior Function Level of Independence: Independent with basic ADLs;Independent with gait;Independent with transfers;Needs assistance with ADLs Vocation: Retired Financial risk analyst Arousal/Alertness: Awake/alert Overall Cognitive Status: Appears within functional limits for tasks assessed Orientation Level: Oriented X4 Sensation/Coordination Sensation Light Touch: Appears Intact Stereognosis: Not tested Hot/Cold: Not tested Proprioception: Appears Intact Extremity Assessment RUE Assessment RUE Assessment: Within Functional Limits LUE Assessment LUE Assessment: Within Functional Limits RLE Assessment RLE Assessment: Within Functional Limits LLE Assessment LLE Assessment: Within Functional Limits Mobility (including Balance) Bed Mobility Bed Mobility: Yes Right Sidelying to Sit: 5: Supervision Right Sidelying to Sit Details (indicate cue type and reason): instructed in log rolling Sitting - Scoot to Edge of Bed: 6: Modified independent (Device/Increase time) Transfers Transfers: Yes Sit to Stand: 6: Modified independent (Device/Increase time) Stand to Sit: 6: Modified independent (Device/Increase time) Ambulation/Gait Ambulation/Gait: Yes Ambulation/Gait Assistance: 6: Modified independent (Device/Increase time) Ambulation Distance (Feet): 100 Feet Assistive device: Rolling walker Gait Pattern: Trunk flexed (leans very heavily on walker) Gait velocity: WNL Stairs: No Wheelchair Mobility Wheelchair Mobility: No  Posture/Postural Control Posture/Postural Control: No significant limitations Balance Balance Assessed: No Exercise    End of Session PT - End of Session Equipment Utilized During Treatment: Gait belt Activity Tolerance: Patient tolerated treatment well Patient left: in bed;with call bell in reach;with bed alarm set;with family/visitor present General Behavior During Session: [facility] for tasks  performed Cognition: [facility] for tasks performed  Konrad Penta 01/14/2011, 12:20 PM

## 2011-01-14 NOTE — Progress Notes (Addendum)
Subjective: The patient is status post upper endoscopy. She did have a a couple of loose stools, black in color overnight and this morning.  Objective: Vital signs in last 24 hours: Filed Vitals:   01/14/11 1125 01/14/11 1130 01/14/11 1135 01/14/11 1157  BP: 143/53 151/69  139/83  Pulse: 76 78 75 76  Temp:    97.9 F (36.6 C)  TempSrc:    Oral  Resp: 20 21 19 20   Height:      Weight:      SpO2: 99% 99% 99% 95%    Intake/Output Summary (Last 24 hours) at 01/14/11 1254 Last data filed at 01/14/11 0859  Gross per 24 hour  Intake   2330 ml  Output    900 ml  Net   1430 ml    Weight change:   Exam: Lungs: Clear to auscultation bilaterally. Heart: S1, S2, with a soft systolic murmur. Abdomen: Obese, positive bowel sounds, soft, nontender, nondistended. Extremities: No pedal edema.  Lab Results: Basic Metabolic Panel:  Basename 01/14/11 0443 01/13/11 1600  NA 142 141  K 3.7 3.8  CL 110 107  CO2 24 25  GLUCOSE 88 115*  BUN 31* 47*  CREATININE 0.70 0.74  CALCIUM 9.4 10.4  MG -- --  PHOS -- --   Liver Function Tests:  Basename 01/14/11 0443 01/13/11 1600  AST 15 20  ALT 14 17  ALKPHOS 113 136*  BILITOT 1.1 0.4  PROT 5.4* 6.0  ALBUMIN 2.8* 3.0*   No results found for this basename: LIPASE:2,AMYLASE:2 in the last 72 hours No results found for this basename: AMMONIA:2 in the last 72 hours CBC:  Basename 01/14/11 1213 01/14/11 0444 01/13/11 1600  WBC 9.8 11.7* --  NEUTROABS -- -- 12.8*  HGB 9.9* 9.1* --  HCT 29.4* 26.7* --  MCV 88.3 87.8 --  PLT 248 249 --   Cardiac Enzymes: No results found for this basename: CKTOTAL:3,CKMB:3,CKMBINDEX:3,TROPONINI:3 in the last 72 hours BNP: No results found for this basename: POCBNP:3 in the last 72 hours D-Dimer: No results found for this basename: DDIMER:2 in the last 72 hours CBG: No results found for this basename: GLUCAP:6 in the last 72 hours Hemoglobin A1C: No results found for this basename: HGBA1C in the  last 72 hours Fasting Lipid Panel: No results found for this basename: CHOL,HDL,LDLCALC,TRIG,CHOLHDL,LDLDIRECT in the last 72 hours Thyroid Function Tests:  Basename 01/13/11 1600  TSH 1.240  T4TOTAL --  FREET4 1.01  T3FREE --  THYROIDAB --   Anemia Panel:  Basename 01/13/11 1600  VITAMINB12 385  FOLATE >20.0  FERRITIN 36  TIBC 330  IRON 144*  RETICCTPCT 2.2   Coagulation:  Basename 01/13/11 1600  LABPROT 14.7  INR 1.13   Urine Drug Screen:  Alcohol Level: No results found for this basename: ETH:2 in the last 72 hours Urinalysis:  Misc. Labs:   Micro: No results found for this or any previous visit (from the past 240 hour(s)).  Studies/Results: No results found.  Medications: I have reviewed the patient's current medications.  Assessment: Active Problems:  GI bleeding  Diverticulosis of colon  Anemia associated with acute blood loss  Prerenal azotemia  Sinus tachycardia  Hypotension due to blood loss  Leukocytosis  Sacral insufficiency fracture  Groin pain, left lower quadrant  Hypothyroidism  Orthostatic lightheadedness  Gastric ulcer with hemorrhage     1. GI bleeding, likely secondary to peptic ulcer disease.  Gastric ulcers per EGD by Dr. Karilyn Cota. Likely NSAID induced.  Acute  blood loss anemia. Status post 2 units of packed red blood cell transfusions. Her hemoglobin improved from 8.2-9.9.  Prerenal azotemia. Resolving with IV fluids.  Sacral insufficiency fracture on the left. Physical therapy has been ordered. Analgesics ordered.  Hypothyroidism. Her TSH and free T4 are within normal limits, on replacement therapy.  Initial hypotension with a history of chronic hypertension. Her blood pressure is trending upward and therefore verapamil will be restarted at a smaller dose. It will be titrated accordingly.    Plan:  We'll continue to monitor her CBCs every 6 hours for the next 24 hours. Continue IV Protonix. H. pylori serology was  ordered by Dr. Karilyn Cota. The patient was advised to avoid NSAIDs. Full liquid diet per Dr. Karilyn Cota.  Will restart verapamil at 180 mg daily and titrate to 240 mg as her blood pressure increases. We'll decrease the rate of the IV fluids.   LOS: 1 day   Carina Chaplin 01/14/2011, 12:54 PM

## 2011-01-14 NOTE — Progress Notes (Signed)
01/14/11 1425 Ladarious Kresse RN BSN Pt admitted w/ GI bleed, transfused. Lives at home alone with her granddaughter to assist her during the day. Pt requested HH aid. After PT eval and speaking w/ Dr. Sherrie Mustache, pt does not meet [facility] with Medicare criteria. Advised pt and informed her and her granddaughter that another option would be to contact her PCP to begin the paperwork to Medicaid to see if she would qualify for an aid.

## 2011-01-14 NOTE — Op Note (Signed)
EGD PROCEDURE REPORT  PATIENT:  Sheila [name]  MR#:  [id number] Birthdate:  September 26, 1940, 70 y.o., female Endoscopist:  Dr. Malissa Hippo, MD Referred By:  Dr. Elliot Cousin, MD Procedure Date: 01/14/2011  Procedure:   EGD  Indications:  Patient is 70 year old Caucasian female who's been on chronic NSAID therapy who presents with melena and anemia. She has received 2 units of PRBCs. Her hemoglobin is up from 8.2 to 9.2 posttransfusion.             Informed Consent:  Procedure and risks were reviewed with the patient and informed consent was obtained. Medications:  Demerol 50 mg IV Versed 6 mg IV Cetacaine spray topically for oropharyngeal anesthesia  Description of procedure:  The endoscope was introduced through the mouth and advanced to the second portion of the duodenum without difficulty or limitations. The mucosal surfaces were surveyed very carefully during advancement of the scope and upon withdrawal.  Findings:  Esophagus:  Normal mucosa of the esophagus. GEJ:  37 cm Stomach:  Stomach was empty and distended very well with insufflation. Folds in the proximal stomach were normal. Mucosa at gastric body was normal. Two ulcers noted at antrum.  Ulcer located proximally was  about 5 mm and the larger ulcer was located distally about 12 mm in maximal diameter. Two tiny punctate pigment spots but no protruding blood vessel noted. Ulcer was washed with sterile water and no bleeding induced. Therefore no therapy rendered. Angularis fundus and cardia were examined by retroflexing the scope and were normal. Otic channel was patent Duodenum:  Normal bulbar and post bulbar mucosa.  Therapeutic/Diagnostic Maneuvers Performed:  None  Complications:  None  Impression: 5 mm antral ulcer with clean base. 12 mm deep antral ulcer with 2 tiny pigment spots no active bleeding noted. Suspect patient bled from this ulcer. Endoscopically it appears to be benign.  Recommendations:  No  NSAIDs. Continue PPI at double dose. H. pylori serology. Full liquid diet. He would need repeat EGD in 10 weeks document healing of this ulcer and would also offer screening colonoscopy at that time.  REHMAN,NAJEEB U  01/14/2011  11:37 AM  CC: Dr. Bevelyn Buckles, Grayce Sessions, OTR, OTR & Dr. Bonnetta Barry ref. provider found

## 2011-01-14 NOTE — Progress Notes (Signed)
UR Chart Review Completed  

## 2011-01-14 NOTE — Consult Note (Signed)
Reason for Consult:GI bleed Referring Physician: Aisley [name] is an 70 y.o. female.  JYN:WGNFA is a 70 yr old female admitted yesterday thru the ED with c/o of lightheadedness when standing, blurred vision and weakness.  She went to the bathroom and laid in the floor and finally crawled to her bed.  She tells me she started having black stools yesterday. She had 3 black stools before presenting to the ED. She Meloxican prn per patient. No abdominal recently. She was seen in the ED last week and empirically treated for diverticulitis.  Appetite has not been good. No weight loss. She had nausea or admission but known now. Her admission Hemoglobin was 8.2. She has been transfused with 2 units of PRBCs.  Her WBC ct elevated at 16.3.  Hemoglobin this am 9.1 CT of the abdomen and pelvis on 01/02/11 revealed : No acute findings in the abdomen and pelvis. There is diverticular disease in the sigmoid colon but no CT evidence of diverticulitis. Left sacral insufficiency fracture. Her granddaughter tell me that while Sheila [name] was in the ED she had a dark blood stool. She did not see any black stools.   Past Medical History  Diagnosis Date  . Hypertension   . Depression   . Hypercholesteremia   . Diverticulosis of colon   . Hypothyroidism   . Chest pain 07/2009    Myoveiw stress test negative.  [name] DJD (degenerative joint disease) of knee   . Bell's palsy   . UTI (lower urinary tract infection)     Past Surgical History  Procedure Date  . Knee surgery 04/2010.    Total left knee replacement    History reviewed. No pertinent family history.  Social History:  reports that she has quit smoking. She uses smokeless tobacco. She reports that she does not drink alcohol. Her drug history not on file.  Allergies:  Allergies  Allergen Reactions  . Benadryl (Altaryl)   . Codeine   . Diphenhydramine Hcl   . Morphine And Related    Med Current Facility-Administered Medications  Medication Dose  Route Frequency Provider Last Rate Last Dose  . 0.9 % NaCl with KCl 20 mEq/ L  infusion   Intravenous Continuous Denise Fisher 100 mL/hr at 01/13/11 1904    . acetaminophen (TYLENOL) tablet 650 mg  650 mg Oral Q6H PRN Elliot Cousin       Or  . acetaminophen (TYLENOL) suppository 650 mg  650 mg Rectal Q6H PRN Elliot Cousin      . alum & mag hydroxide-simeth (MAALOX/MYLANTA) 200-200-20 MG/5ML suspension 30 mL  30 mL Oral Q6H PRN Elliot Cousin      . guaiFENesin-dextromethorphan (ROBITUSSIN DM) 100-10 MG/5ML syrup 5 mL  5 mL Oral Q4H PRN Elliot Cousin      . influenza  inactive virus vaccine (FLUZONE/FLUARIX) injection 0.5 mL  0.5 mL Intramuscular Tomorrow-1000 Elliot Cousin      . levothyroxine (SYNTHROID, LEVOTHROID) tablet 75 mcg  75 mcg Oral Q0600 Elliot Cousin      . olanzapine-FLUoxetine (SYMBYAX) 6-25 MG per capsule 1 capsule  1 capsule Oral QAM Denise Fisher   1 capsule at 01/14/11 0533  . ondansetron (ZOFRAN) tablet 4 mg  4 mg Oral Q6H PRN Elliot Cousin       Or  . ondansetron (ZOFRAN) injection 4 mg  4 mg Intravenous Q6H PRN Elliot Cousin      . oxyCODONE (Oxy IR/ROXICODONE) immediate release tablet 5 mg  5 mg Oral Q4H PRN  Elliot Cousin   5 mg at 01/13/11 2035  . pantoprazole (PROTONIX) injection 40 mg  40 mg Intravenous Once Benny Lennert, MD   40 mg at 01/13/11 1600  . pantoprazole (PROTONIX) injection 40 mg  40 mg Intravenous Q12H Elliot Cousin      . pneumococcal 23 valent vaccine (PNU-IMMUNE) injection 0.5 mL  0.5 mL Intramuscular Tomorrow-1000 Elliot Cousin      . potassium chloride SA (K-DUR,KLOR-CON) CR tablet 20 mEq  20 mEq Oral Daily Elliot Cousin      . simvastatin (ZOCOR) tablet 20 mg  20 mg Oral q1800 Elliot Cousin      . sodium chloride 0.9 % bolus 1,000 mL  1,000 mL Intravenous Once Benny Lennert, MD   1,000 mL at 01/13/11 1612  . DISCONTD: albuterol (PROVENTIL) (5 MG/ML) 0.5% nebulizer solution 2.5 mg  2.5 mg Nebulization Q2H PRN Elliot Cousin      . DISCONTD:  pantoprazole (PROTONIX) injection 40 mg  40 mg Intravenous Q24H Elliot Cousin      . DISCONTD: sodium chloride 0.9 % bolus 1,000 mL  1,000 mL Intravenous Once Benny Lennert, MD        Medications: I have reviewed the patient's current medications.  Results for orders placed during the hospital encounter of 01/13/11 (from the past 48 hour(s))  CBC     Status: Abnormal   Collection Time   01/13/11  4:00 PM      Component Value Range Comment   WBC 16.3 (*) 4.0 - 10.5 (K/uL)    RBC 2.79 (*) 3.87 - 5.11 (MIL/uL)    Hemoglobin 8.2 (*) 12.0 - 15.0 (g/dL)    HCT 16.1 (*) 09.6 - 46.0 (%)    MCV 87.8  78.0 - 100.0 (fL)    MCH 29.4  26.0 - 34.0 (pg)    MCHC 33.5  30.0 - 36.0 (g/dL)    RDW 04.5  40.9 - 81.1 (%)    Platelets 349  150 - 400 (K/uL)   DIFFERENTIAL     Status: Abnormal   Collection Time   01/13/11  4:00 PM      Component Value Range Comment   Neutrophils Relative 79 (*) 43 - 77 (%)    Neutro Abs 12.8 (*) 1.7 - 7.7 (K/uL)    Lymphocytes Relative 17  12 - 46 (%)    Lymphs Abs 2.7  0.7 - 4.0 (K/uL)    Monocytes Relative 5  3 - 12 (%)    Monocytes Absolute 0.8  0.1 - 1.0 (K/uL)    Eosinophils Relative 0  0 - 5 (%)    Eosinophils Absolute 0.0  0.0 - 0.7 (K/uL)    Basophils Relative 0  0 - 1 (%)    Basophils Absolute 0.0  0.0 - 0.1 (K/uL)   COMPREHENSIVE METABOLIC PANEL     Status: Abnormal   Collection Time   01/13/11  4:00 PM      Component Value Range Comment   Sodium 141  135 - 145 (mEq/L)    Potassium 3.8  3.5 - 5.1 (mEq/L)    Chloride 107  96 - 112 (mEq/L)    CO2 25  19 - 32 (mEq/L)    Glucose, Bld 115 (*) 70 - 99 (mg/dL)    BUN 47 (*) 6 - 23 (mg/dL)    Creatinine, Ser 9.14  0.50 - 1.10 (mg/dL)    Calcium 78.2  8.4 - 10.5 (mg/dL)    Total Protein 6.0  6.0 - 8.3 (g/dL)    Albumin 3.0 (*) 3.5 - 5.2 (g/dL)    AST 20  0 - 37 (U/L)    ALT 17  0 - 35 (U/L)    Alkaline Phosphatase 136 (*) 39 - 117 (U/L)    Total Bilirubin 0.4  0.3 - 1.2 (mg/dL)    GFR calc non Af Amer  85 (*) >90 (mL/min)    GFR calc Af Amer >90  >90 (mL/min)   TYPE AND SCREEN     Status: Normal   Collection Time   01/13/11  4:00 PM      Component Value Range Comment   ABO/RH(D) O POS      Antibody Screen NEG      Sample Expiration 01/16/2011      Unit Number [id number]      Blood Component Type RED CELLS,LR      Unit division 00      Status of Unit ISSUED,FINAL      Transfusion Status OK TO TRANSFUSE      Crossmatch Result Compatible      Unit Number [id number]      Blood Component Type RED CELLS,LR      Unit division 00      Status of Unit ISSUED,FINAL      Transfusion Status OK TO TRANSFUSE      Crossmatch Result Compatible     PREPARE RBC (CROSSMATCH)     Status: Normal   Collection Time   01/13/11  4:00 PM      Component Value Range Comment   Order Confirmation ORDER PROCESSED BY BLOOD BANK     ABO/RH     Status: Normal   Collection Time   01/13/11  4:00 PM      Component Value Range Comment   ABO/RH(D) O POS     PROTIME-INR     Status: Normal   Collection Time   01/13/11  4:00 PM      Component Value Range Comment   Prothrombin Time 14.7  11.6 - 15.2 (seconds)    INR 1.13  0.00 - 1.49    APTT     Status: Normal   Collection Time   01/13/11  4:00 PM      Component Value Range Comment   aPTT 28  24 - 37 (seconds)   VITAMIN B12     Status: Normal   Collection Time   01/13/11  4:00 PM      Component Value Range Comment   Vitamin B-12 385  211 - 911 (pg/mL)   FOLATE     Status: Normal   Collection Time   01/13/11  4:00 PM      Component Value Range Comment   Folate >20.0     IRON AND TIBC     Status: Abnormal   Collection Time   01/13/11  4:00 PM      Component Value Range Comment   Iron 144 (*) 42 - 135 (ug/dL)    TIBC 086  578 - 469 (ug/dL)    Saturation Ratios 44  20 - 55 (%)    UIBC 186  125 - 400 (ug/dL)   FERRITIN     Status: Normal   Collection Time   01/13/11  4:00 PM      Component Value Range Comment   Ferritin 36  10 - 291 (ng/mL)   TSH      Status: Normal   Collection Time   01/13/11  4:00 PM  Component Value Range Comment   TSH 1.240  0.350 - 4.500 (uIU/mL)   T4, FREE     Status: Normal   Collection Time   01/13/11  4:00 PM      Component Value Range Comment   Free T4 1.01  0.80 - 1.80 (ng/dL)   RETICULOCYTES     Status: Abnormal   Collection Time   01/13/11  4:00 PM      Component Value Range Comment   Retic Ct Pct 2.2  0.4 - 3.1 (%)    RBC. 2.79 (*) 3.87 - 5.11 (MIL/uL)    Retic Count, Manual 61.4  19.0 - 186.0 (K/uL)   URINALYSIS, ROUTINE W REFLEX MICROSCOPIC     Status: Abnormal   Collection Time   01/13/11  6:13 PM      Component Value Range Comment   Color, Urine STRAW (*) YELLOW     Appearance CLEAR  CLEAR     Specific Gravity, Urine 1.010  1.005 - 1.030     pH 7.0  5.0 - 8.0     Glucose, UA NEGATIVE  NEGATIVE (mg/dL)    Hgb urine dipstick NEGATIVE  NEGATIVE     Bilirubin Urine NEGATIVE  NEGATIVE     Ketones, ur NEGATIVE  NEGATIVE (mg/dL)    Protein, ur NEGATIVE  NEGATIVE (mg/dL)    Urobilinogen, UA 0.2  0.0 - 1.0 (mg/dL)    Nitrite NEGATIVE  NEGATIVE     Leukocytes, UA NEGATIVE  NEGATIVE  MICROSCOPIC NOT DONE ON URINES WITH NEGATIVE PROTEIN, BLOOD, LEUKOCYTES, NITRITE, OR GLUCOSE <1000 mg/dL.  CBC     Status: Abnormal   Collection Time   01/14/11  1:01 AM      Component Value Range Comment   WBC 12.7 (*) 4.0 - 10.5 (K/uL)    RBC 3.04 (*) 3.87 - 5.11 (MIL/uL)    Hemoglobin 9.1 (*) 12.0 - 15.0 (g/dL)    HCT 81.1 (*) 91.4 - 46.0 (%)    MCV 87.5  78.0 - 100.0 (fL)    MCH 29.9  26.0 - 34.0 (pg)    MCHC 34.2  30.0 - 36.0 (g/dL)    RDW 78.2  95.6 - 21.3 (%)    Platelets 267  150 - 400 (K/uL) DELTA CHECK NOTED  COMPREHENSIVE METABOLIC PANEL     Status: Abnormal   Collection Time   01/14/11  4:43 AM      Component Value Range Comment   Sodium 142  135 - 145 (mEq/L)    Potassium 3.7  3.5 - 5.1 (mEq/L)    Chloride 110  96 - 112 (mEq/L)    CO2 24  19 - 32 (mEq/L)    Glucose, Bld 88  70 - 99 (mg/dL)     BUN 31 (*) 6 - 23 (mg/dL) DELTA CHECK NOTED   Creatinine, Ser 0.70  0.50 - 1.10 (mg/dL)    Calcium 9.4  8.4 - 10.5 (mg/dL)    Total Protein 5.4 (*) 6.0 - 8.3 (g/dL)    Albumin 2.8 (*) 3.5 - 5.2 (g/dL)    AST 15  0 - 37 (U/L)    ALT 14  0 - 35 (U/L)    Alkaline Phosphatase 113  39 - 117 (U/L)    Total Bilirubin 1.1  0.3 - 1.2 (mg/dL)    GFR calc non Af Amer 86 (*) >90 (mL/min)    GFR calc Af Amer >90  >90 (mL/min)   CBC     Status: Abnormal  Collection Time   01/14/11  4:44 AM      Component Value Range Comment   WBC 11.7 (*) 4.0 - 10.5 (K/uL)    RBC 3.04 (*) 3.87 - 5.11 (MIL/uL)    Hemoglobin 9.1 (*) 12.0 - 15.0 (g/dL)    HCT 19.1 (*) 47.8 - 46.0 (%)    MCV 87.8  78.0 - 100.0 (fL)    MCH 29.9  26.0 - 34.0 (pg)    MCHC 34.1  30.0 - 36.0 (g/dL)    RDW 29.5  62.1 - 30.8 (%)    Platelets 249  150 - 400 (K/uL)     No results found.  ROS see hpi Blood pressure 138/76, pulse 66, temperature 98.4 F (36.9 C), temperature source Oral, resp. rate 20, height 5\' 1"  (1.549 m), weight 187 lb 6.3 oz (85 kg), SpO2 94.00%. Physical Exam  Assessment/Plan:Alert and oriented. Skin warm and dry. Oral mucosa is moist. Natural teeth in good condition. Sclera anicteric, conjunctivae is pink. Thyroid not enlarged. No cervical lymphadenopathy. Lungs clear. Heart regular rate and rhythm.  Abdomen is soft. Bowel sounds are positive. No hepatomegaly. No abdominal masses felt. No tenderness.Rectal exam reveal very dark blood.   No edema to lower extremities. Patient is alert and oriented.   Plan: I will discuss with Dr. Karilyn Cota. Will probably need an EGD later today given her hx of NSAID use.  SETZER,TERRI W 01/14/2011, 8:24 AM    I have interviewed and examined patient and reviewed her records. 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 Patient is agreeable to proceeding with EGD.

## 2011-01-15 DIAGNOSIS — K921 Melena: Secondary | ICD-10-CM

## 2011-01-15 LAB — TYPE AND SCREEN
ABO/RH(D): O POS
Antibody Screen: NEGATIVE
Unit division: 0
Unit division: 0

## 2011-01-15 LAB — CBC
HCT: 27.9 % — ABNORMAL LOW (ref 36.0–46.0)
HCT: 28.1 % — ABNORMAL LOW (ref 36.0–46.0)
Hemoglobin: 9.5 g/dL — ABNORMAL LOW (ref 12.0–15.0)
Hemoglobin: 9.7 g/dL — ABNORMAL LOW (ref 12.0–15.0)
MCH: 30.3 pg (ref 26.0–34.0)
MCH: 30.4 pg (ref 26.0–34.0)
MCHC: 34.1 g/dL (ref 30.0–36.0)
MCHC: 34.5 g/dL (ref 30.0–36.0)
MCV: 88.1 fL (ref 78.0–100.0)
MCV: 88.9 fL (ref 78.0–100.0)
Platelets: 214 10*3/uL (ref 150–400)
Platelets: 256 10*3/uL (ref 150–400)
RBC: 3.14 MIL/uL — ABNORMAL LOW (ref 3.87–5.11)
RBC: 3.19 MIL/uL — ABNORMAL LOW (ref 3.87–5.11)
RDW: 14.9 % (ref 11.5–15.5)
RDW: 15 % (ref 11.5–15.5)
WBC: 11.1 10*3/uL — ABNORMAL HIGH (ref 4.0–10.5)
WBC: 8.5 10*3/uL (ref 4.0–10.5)

## 2011-01-15 LAB — URINALYSIS, ROUTINE W REFLEX MICROSCOPIC
Bilirubin Urine: NEGATIVE
Glucose, UA: NEGATIVE mg/dL
Hgb urine dipstick: NEGATIVE
Ketones, ur: NEGATIVE mg/dL
Leukocytes, UA: NEGATIVE
Nitrite: NEGATIVE
Protein, ur: NEGATIVE mg/dL
Specific Gravity, Urine: 1.01 (ref 1.005–1.030)
Urobilinogen, UA: 0.2 mg/dL (ref 0.0–1.0)
pH: 6.5 (ref 5.0–8.0)

## 2011-01-15 LAB — URINE CULTURE
Colony Count: 15000
Culture  Setup Time: 201211211412

## 2011-01-15 LAB — BASIC METABOLIC PANEL
BUN: 8 mg/dL (ref 6–23)
CO2: 27 mEq/L (ref 19–32)
Calcium: 9.8 mg/dL (ref 8.4–10.5)
Chloride: 110 mEq/L (ref 96–112)
Creatinine, Ser: 0.68 mg/dL (ref 0.50–1.10)
GFR calc Af Amer: >90 mL/min (ref >90)
GFR calc non Af Amer: 87 mL/min — ABNORMAL LOW (ref >90)
Glucose, Bld: 92 mg/dL (ref 70–99)
Potassium: 4.1 mEq/L (ref 3.5–5.1)
Sodium: 140 mEq/L (ref 135–145)

## 2011-01-15 MED ORDER — PANTOPRAZOLE SODIUM 40 MG PO TBEC: 40.0000 mg | DELAYED_RELEASE_TABLET | Freq: Two times a day (BID) | ORAL | Status: DC

## 2011-01-15 MED ORDER — VERAPAMIL HCL ER 240 MG PO TBCR: 240.0000 mg | EXTENDED_RELEASE_TABLET | ORAL | Status: DC

## 2011-01-15 MED ORDER — FUROSEMIDE 20 MG PO TABS: 20.0000 mg | ORAL_TABLET | Freq: Every day | ORAL | Status: DC | PRN

## 2011-01-15 MED ORDER — ACETAMINOPHEN ER 650 MG PO TBCR: 650.0000 mg | EXTENDED_RELEASE_TABLET | Freq: Three times a day (TID) | ORAL | Status: AC | PRN

## 2011-01-15 NOTE — Progress Notes (Signed)
Subjective: One very small black stool today. No abd pain. Tolerating pos.  Objective: Vital signs in last 24 hours: Temp:  [97.9 F (36.6 C)-98.6 F (37 C)] 98.6 F (37 C) (11/22 1317) Pulse Rate:  [62-103] 78  (11/22 1317) Resp:  [20-22] 20  (11/22 1317) BP: (120-159)/(68-112) 120/74 mmHg (11/22 1317) SpO2:  [95 %-99 %] 98 % (11/22 1317) Weight:  [185 lb 6.4 oz (84.097 kg)] 185 lb 6.4 oz (84.097 kg) (11/22 0604) Last BM Date: 01/14/11  Intake/Output from previous day: 11/21 0701 - 11/22 0700 In: 850 [P.O.:850] Out: 1650 [Urine:1650] Intake/Output this shift: Total I/O In: 600 [P.O.:600] Out: 2001 [Urine:2000; Stool:1]  General appearance: alert, cooperative and no distress GI: soft, non-tender; bowel sounds normal  Lab Results:  Basename 01/15/11 0615 01/14/11 2357 01/14/11 1800  WBC 8.5 11.1* 8.7  HGB 9.5* 9.7* 9.2*  HCT 27.9* 28.1* 26.8*  PLT 214 256 248   BMET  Basename 01/15/11 0615 01/14/11 0443 01/13/11 1600  NA 140 142 141  K 4.1 3.7 3.8  CL 110 110 107  CO2 27 24 25   GLUCOSE 92 88 115*  BUN 8 31* 47*  CREATININE 0.68 0.70 0.74  CALCIUM 9.8 9.4 10.4   LFT  Basename 01/14/11 0443  PROT 5.4*  ALBUMIN 2.8*  AST 15  ALT 14  ALKPHOS 113  BILITOT 1.1  BILIDIR --  IBILI --   PT/INR  Basename 01/13/11 1600  LABPROT 14.7  INR 1.13   Medications: I have reviewed the patient's current medications.  Assessment/Plan: Admitted with MELENA-one sml black stool. Hb stable and BP stable.  Plan: 1. Ok to d/c to home 2. Avoid NSAIDs. 3. FOLLOW UP WITH DR. Karilyn Cota. 4. BID PPI   LOS: 2 days   Jonette Eva 01/15/2011, 3:39 PM

## 2011-01-15 NOTE — Discharge Summary (Signed)
Physician Discharge Summary  Sheila [name] MRN: [id number] DOB/AGE: April 19, 1940 70 y.o.  PCP: Allie Dimmer, OTR, OTR   Admit date: 01/13/2011 Discharge date: 01/15/2011  Discharge Diagnoses:  1. Gastric ulcer with hemorrhage/ Upper GI bleed secondary to 2 gastric ulcers, NSAID-induced. H. pylori serology ordered and the result was pending at the time of discharge. Dr. Karilyn Cota recommends a repeat EGD in 10 weeks and screening colonoscopy at that time. 2. Diverticulosis of the colon per CT scan. 3. Acute blood loss anemia, status post transfusion of 2 units of blood. 4. Orthostatic lightheadedness. 5. Prerenal azotemia, resolved. 6. Hypotension secondary to blood loss. Resolved. 7. Hypertension. 8. Sinus tachycardia secondary to volume depletion, resolved. 9. Sacral insufficiency fracture, following a fall at home. 10. Chronic degenerative joint disease and pain. 11. Hypothyroidism. 12. Reactive leukocytosis, resolved.    Current Discharge Medication List    START taking these medications   Details  acetaminophen (TYLENOL 8 HOUR) 650 MG CR tablet Take 1 tablet (650 mg total) by mouth every 8 (eight) hours as needed for pain. Qty: 30 tablet, Refills: 0    pantoprazole (PROTONIX) 40 MG tablet Take 1 tablet (40 mg total) by mouth 2 (two) times daily before a meal. Qty: 60 tablet, Refills: 2      CONTINUE these medications which have CHANGED   Details  furosemide (LASIX) 20 MG tablet Take 1 tablet (20 mg total) by mouth daily as needed.    verapamil (CALAN-SR) 240 MG CR tablet Take 1 tablet (240 mg total) by mouth every morning.      CONTINUE these medications which have NOT CHANGED   Details  alendronate (FOSAMAX) 70 MG tablet Take 70 mg by mouth every 7 (seven) days. Take with a full glass of water on an empty stomach.     levothyroxine (SYNTHROID, LEVOTHROID) 75 MCG tablet Take 75 mcg by mouth daily.      olanzapine-FLUoxetine (SYMBYAX) 6-25 MG per capsule  Take 1 capsule by mouth every morning.      potassium chloride SA (K-DUR,KLOR-CON) 20 MEQ tablet Take 20 mEq by mouth daily.      atorvastatin (LIPITOR) 10 MG tablet Take 20 mg by mouth daily. Patients bottle was empty and she should have had a week left       STOP taking these medications     aspirin EC 81 MG tablet      meloxicam (MOBIC) 15 MG tablet      naproxen (NAPROSYN) 500 MG tablet      oxyCODONE-acetaminophen (PERCOCET) 5-325 MG per tablet      metroNIDAZOLE (FLAGYL) 500 MG tablet      ciprofloxacin (CIPRO) 500 MG tablet         Discharge Condition: Improved and stable.  Disposition: Home or Self Care   Consults: Lionel December M.D.   Significant Diagnostic Studies: Dg Hip Complete Left  01/05/2011  *RADIOLOGY REPORT*  Clinical Data: Left hip pain following a fall 2 weeks ago.  LEFT HIP - COMPLETE 2+ VIEW  Comparison: None.  Findings: Diffuse osteopenia.  No fracture or dislocation seen.  IMPRESSION: No fracture.  Original Report Authenticated By: Darrol Angel, M.D.   Ct Abdomen Pelvis W Contrast  01/02/2011  *RADIOLOGY REPORT*  Clinical Data: Left lower quadrant pain.  CT ABDOMEN AND PELVIS WITH CONTRAST  Technique:  Multidetector CT imaging of the abdomen and pelvis was performed following the standard protocol during bolus administration of intravenous contrast.  Contrast: OMNIPAQUE IOHEXOL 300  MG/ML IV SOLN  Comparison: None.  Findings: Lung bases are unremarkable  The liver, spleen, stomach, in the duodenum, pancreas, gallbladder, adrenal glands, and kidneys are unremarkable.  No abdominal aortic aneurysm.  No evidence for free fluid or lymphadenopathy in the abdomen.  Imaging through the pelvis shows no free intraperitoneal fluid.  No pelvic sidewall lymphadenopathy.  Bladder is unremarkable.  Uterus is normal features.  No adnexal mass.  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  Terminal ileum is normal.  The  appendix is normal.  Bone windows show an insufficiency fracture in the left sacrum. Prominent Schmorl's node is seen in the superior endplate of L4.  IMPRESSION: No acute findings in the abdomen or pelvis.  There is diverticular disease in the sigmoid colon but no CT evidence for diverticulitis.  Left sacral insufficiency fracture.  Original Report Authenticated By: ERIC A. MANSELL, M.D.   EGD on 01/14/2011 by Dr. Karilyn Cota: 5 mm antral ulcer with clean base. 12 mm deep antral ulcer with 2 tiny pigment spots, no active bleeding.   Microbiology: Recent Results (from the past 240 hour(s))  URINE CULTURE     Status: Normal   Collection Time   01/13/11  6:13 PM      Component Value Range Status Comment   Specimen Description URINE, CLEAN CATCH   Final    Special Requests NONE   Final    Setup Time [id number]   Final    Colony Count 15,000 COLONIES/ML   Final    Culture     Final    Value: Multiple bacterial morphotypes present, none predominant. Suggest appropriate recollection if clinically indicated.   Report Status 01/15/2011 FINAL   Final      Labs: Results for orders placed during the hospital encounter of 01/13/11 (from the past 48 hour(s))  URINALYSIS, ROUTINE W REFLEX MICROSCOPIC     Status: Abnormal   Collection Time   01/13/11  6:13 PM      Component Value Range Comment   Color, Urine STRAW (*) YELLOW     Appearance CLEAR  CLEAR     Specific Gravity, Urine 1.010  1.005 - 1.030     pH 7.0  5.0 - 8.0     Glucose, UA NEGATIVE  NEGATIVE (mg/dL)    Hgb urine dipstick NEGATIVE  NEGATIVE     Bilirubin Urine NEGATIVE  NEGATIVE     Ketones, ur NEGATIVE  NEGATIVE (mg/dL)    Protein, ur NEGATIVE  NEGATIVE (mg/dL)    Urobilinogen, UA 0.2  0.0 - 1.0 (mg/dL)    Nitrite NEGATIVE  NEGATIVE     Leukocytes, UA NEGATIVE  NEGATIVE  MICROSCOPIC NOT DONE ON URINES WITH NEGATIVE PROTEIN, BLOOD, LEUKOCYTES, NITRITE, OR GLUCOSE <1000 mg/dL.  URINE CULTURE     Status: Normal   Collection Time    01/13/11  6:13 PM      Component Value Range Comment   Specimen Description URINE, CLEAN CATCH      Special Requests NONE      Setup Time [id number]      Colony Count 15,000 COLONIES/ML      Culture        Value: Multiple bacterial morphotypes present, none predominant. Suggest appropriate recollection if clinically indicated.   Report Status 01/15/2011 FINAL     CBC     Status: Abnormal   Collection Time   01/14/11  1:01 AM      Component Value Range Comment  WBC 12.7 (*) 4.0 - 10.5 (K/uL)    RBC 3.04 (*) 3.87 - 5.11 (MIL/uL)    Hemoglobin 9.1 (*) 12.0 - 15.0 (g/dL)    HCT 13.0 (*) 86.5 - 46.0 (%)    MCV 87.5  78.0 - 100.0 (fL)    MCH 29.9  26.0 - 34.0 (pg)    MCHC 34.2  30.0 - 36.0 (g/dL)    RDW 78.4  69.6 - 29.5 (%)    Platelets 267  150 - 400 (K/uL) DELTA CHECK NOTED  COMPREHENSIVE METABOLIC PANEL     Status: Abnormal   Collection Time   01/14/11  4:43 AM      Component Value Range Comment   Sodium 142  135 - 145 (mEq/L)    Potassium 3.7  3.5 - 5.1 (mEq/L)    Chloride 110  96 - 112 (mEq/L)    CO2 24  19 - 32 (mEq/L)    Glucose, Bld 88  70 - 99 (mg/dL)    BUN 31 (*) 6 - 23 (mg/dL) DELTA CHECK NOTED   Creatinine, Ser 0.70  0.50 - 1.10 (mg/dL)    Calcium 9.4  8.4 - 10.5 (mg/dL)    Total Protein 5.4 (*) 6.0 - 8.3 (g/dL)    Albumin 2.8 (*) 3.5 - 5.2 (g/dL)    AST 15  0 - 37 (U/L)    ALT 14  0 - 35 (U/L)    Alkaline Phosphatase 113  39 - 117 (U/L)    Total Bilirubin 1.1  0.3 - 1.2 (mg/dL)    GFR calc non Af Amer 86 (*) >90 (mL/min)    GFR calc Af Amer >90  >90 (mL/min)   CBC     Status: Abnormal   Collection Time   01/14/11  4:44 AM      Component Value Range Comment   WBC 11.7 (*) 4.0 - 10.5 (K/uL)    RBC 3.04 (*) 3.87 - 5.11 (MIL/uL)    Hemoglobin 9.1 (*) 12.0 - 15.0 (g/dL)    HCT 28.4 (*) 13.2 - 46.0 (%)    MCV 87.8  78.0 - 100.0 (fL)    MCH 29.9  26.0 - 34.0 (pg)    MCHC 34.1  30.0 - 36.0 (g/dL)    RDW 44.0  10.2 - 72.5 (%)    Platelets 249  150 - 400 (K/uL)    CBC     Status: Abnormal   Collection Time   01/14/11 12:13 PM      Component Value Range Comment   WBC 9.8  4.0 - 10.5 (K/uL)    RBC 3.33 (*) 3.87 - 5.11 (MIL/uL)    Hemoglobin 9.9 (*) 12.0 - 15.0 (g/dL)    HCT 36.6 (*) 44.0 - 46.0 (%)    MCV 88.3  78.0 - 100.0 (fL)    MCH 29.7  26.0 - 34.0 (pg)    MCHC 33.7  30.0 - 36.0 (g/dL)    RDW 34.7  42.5 - 95.6 (%)    Platelets 248  150 - 400 (K/uL)   CBC     Status: Abnormal   Collection Time   01/14/11  6:00 PM      Component Value Range Comment   WBC 8.7  4.0 - 10.5 (K/uL)    RBC 3.05 (*) 3.87 - 5.11 (MIL/uL)    Hemoglobin 9.2 (*) 12.0 - 15.0 (g/dL)    HCT 38.7 (*) 56.4 - 46.0 (%)    MCV 87.9  78.0 - 100.0 (fL)  MCH 30.2  26.0 - 34.0 (pg)    MCHC 34.3  30.0 - 36.0 (g/dL)    RDW 16.1  09.6 - 04.5 (%)    Platelets 248  150 - 400 (K/uL)   CBC     Status: Abnormal   Collection Time   01/14/11 11:57 PM      Component Value Range Comment   WBC 11.1 (*) 4.0 - 10.5 (K/uL)    RBC 3.19 (*) 3.87 - 5.11 (MIL/uL)    Hemoglobin 9.7 (*) 12.0 - 15.0 (g/dL)    HCT 40.9 (*) 81.1 - 46.0 (%)    MCV 88.1  78.0 - 100.0 (fL)    MCH 30.4  26.0 - 34.0 (pg)    MCHC 34.5  30.0 - 36.0 (g/dL)    RDW 91.4  78.2 - 95.6 (%)    Platelets 256  150 - 400 (K/uL)   URINALYSIS, ROUTINE W REFLEX MICROSCOPIC     Status: Abnormal   Collection Time   01/15/11  4:33 AM      Component Value Range Comment   Color, Urine STRAW (*) YELLOW     Appearance CLEAR  CLEAR     Specific Gravity, Urine 1.010  1.005 - 1.030     pH 6.5  5.0 - 8.0     Glucose, UA NEGATIVE  NEGATIVE (mg/dL)    Hgb urine dipstick NEGATIVE  NEGATIVE     Bilirubin Urine NEGATIVE  NEGATIVE     Ketones, ur NEGATIVE  NEGATIVE (mg/dL)    Protein, ur NEGATIVE  NEGATIVE (mg/dL)    Urobilinogen, UA 0.2  0.0 - 1.0 (mg/dL)    Nitrite NEGATIVE  NEGATIVE     Leukocytes, UA NEGATIVE  NEGATIVE  MICROSCOPIC NOT DONE ON URINES WITH NEGATIVE PROTEIN, BLOOD, LEUKOCYTES, NITRITE, OR GLUCOSE <1000 mg/dL.    BASIC METABOLIC PANEL     Status: Abnormal   Collection Time   01/15/11  6:15 AM      Component Value Range Comment   Sodium 140  135 - 145 (mEq/L)    Potassium 4.1  3.5 - 5.1 (mEq/L)    Chloride 110  96 - 112 (mEq/L)    CO2 27  19 - 32 (mEq/L)    Glucose, Bld 92  70 - 99 (mg/dL)    BUN 8  6 - 23 (mg/dL)    Creatinine, Ser 2.13  0.50 - 1.10 (mg/dL)    Calcium 9.8  8.4 - 10.5 (mg/dL)    GFR calc non Af Amer 87 (*) >90 (mL/min)    GFR calc Af Amer >90  >90 (mL/min)   CBC     Status: Abnormal   Collection Time   01/15/11  6:15 AM      Component Value Range Comment   WBC 8.5  4.0 - 10.5 (K/uL)    RBC 3.14 (*) 3.87 - 5.11 (MIL/uL)    Hemoglobin 9.5 (*) 12.0 - 15.0 (g/dL)    HCT 08.6 (*) 57.8 - 46.0 (%)    MCV 88.9  78.0 - 100.0 (fL)    MCH 30.3  26.0 - 34.0 (pg)    MCHC 34.1  30.0 - 36.0 (g/dL)    RDW 46.9  62.9 - 52.8 (%)    Platelets 214  150 - 400 (K/uL)      HPI : The patient is a 70 year old woman with a past medical history significant for hypothyroidism, hypertension, and degenerative joint disease. She presented to the emergency department on 01/13/2011 with a  chief complaint of generalized weakness, lightheadedness, and black/bloody stools. In the emergency department, her blood pressure was 110/63 and her heart rate was 103. Her blood pressure was initially 83/48 and her heart rate was initially 124. Her lab data were significant for a WBC of 16.3, hemoglobin of 8.2, and BUN of 47. On rectal exam by the emergency department physician, her stool was a dark maroon to black color. She was admitted for further evaluation and management.  HOSPITAL COURSE: The patient was typed and crossed 2 units of packed red blood cells. She was later transfused both units. IV fluids were started for hydration. All NSAIDs were discontinued. Protonix at 40 mg IV every 12 hours was initiated. Her hemoglobin and hematocrit were monitored every 6 hours. For further evaluation, a number of studies were  ordered. Her urinalysis was without infection. Her total iron was elevated at 144, TIBC of 3:30, ferritin of 36, folate of greater than 20, and vitamin B12 of 385. The patient was noted to have an elevated total iron and it was suspected that her anemia panel was obtained after the transfusion, but this was not confirmed. Her PT and PTT were both within normal limits. Her TSH was within normal limits at 1.2 and her free T4 was within normal limits at 1.01.  Following the transfusions, her hemoglobin improved to 9.1. He remained stable, ranging from 9.1-9.9 during the 24-hour period leading up to discharge. Her blood pressure improved. Her tachycardia resolved. She had no complaints of orthostatic hypotension following the transfusions and IV fluid hydration.   Gastroenterologist, Dr. Karilyn Cota, was consulted. He evaluated the patient and subsequently performed an EGD. The results were dictated above. Following the results, he recommended discontinuation of all NSAIDs. He recommended continuing twice a day dosing of Protonix. He ordered an H. pylori serology, but the result was pending at the time of discharge. Per his recommendation, the patient will followup with him for an repeat EGD for interval assessment and a screening colonoscopy in approximately 10 weeks.  The patient was noted to have a sacral insufficiency fracture based on the CT scan of her abdomen and pelvis that was ordered a couple weeks prior during her emergency department visit for abdominal pain. She was evaluated by the physical therapist who recommended no ongoing physical therapy. The patient was advised to continue assisted devices as previously used at home. Analgesics were ordered as needed.    The patient's diet was gradually advanced. She tolerated the advancement well. She did have one very small dark brown to black stool prior to discharge. At the time of discharge, she and her granddaughter were instructed to come back to the  hospital if she had further bloody stools. They both voiced understanding. She was also instructed to discontinue all NSAIDs. She was instructed to restart aspirin at 81 mg daily and approximately 4 weeks. She voiced understanding.    Discharge Exam: Blood pressure 120/74, pulse 78, temperature 98.6 F (37 C), temperature source Oral, resp. rate 20, height 5\' 1"  (1.549 m), weight 84.097 kg (185 lb 6.4 oz), SpO2 98.00%.  Lungs: Clear to auscultation bilaterally. Heart: S1, S2, with a soft systolic murmur. Abdomen: Positive bowel sounds, obese, nontender, nondistended. Extremities: No pedal edema.   Discharge Orders    Future Appointments: Provider: Department: Dept Phone: Center:   01/19/2011 3:00 PM [facility]     Future Orders Please Complete By Expires   Diet - low sodium heart healthy  Increase activity slowly      Discharge instructions      Comments:   RESTART ASPIRIN AT 81 MG DAILY IN 4 WEEKS. DO NOT TAKE MELOXICAM, NAPROXEN, IBUPROFEN OR MEDICATIONS LIKE THESE.      Follow-up Information    Make an appointment with Malissa Hippo, MD. (2 -3 week follow up.)    Contact information:   [address], [address] [place] [address] [phone]       Make an appointment with Allie Dimmer, OTR. (1-2 week follow up)    Contact information:   [address] [place] [address] [phone]          Discharge time: 40 minutes.   Signed: Montrice Montuori 01/15/2011, 4:00 PM

## 2011-01-15 NOTE — Progress Notes (Signed)
D/c instructions reviewed with daughter and granddaughter. Verbalized understanding. Pt dc'd to home with granddaughter.  Schonewitz, Candelaria Stagers 01/15/2011

## 2011-01-15 NOTE — Progress Notes (Signed)
Subjective: The patient denies further bowel movements since yesterday afternoon. She is tolerating her full liquid diet. No complaints of abdominal pain, nausea, or vomiting.  Objective: Vital signs in last 24 hours: Filed Vitals:   01/14/11 1451 01/14/11 1832 01/15/11 0505 01/15/11 0604  BP: 147/79 159/112 120/68   Pulse: 88 103 62   Temp: 98 F (36.7 C) 98.2 F (36.8 C) 97.9 F (36.6 C)   TempSrc: Oral Oral Oral   Resp: 20 22 20    Height:      Weight:    84.097 kg (185 lb 6.4 oz)  SpO2: 95% 95% 99%     Intake/Output Summary (Last 24 hours) at 01/15/11 1036 Last data filed at 01/15/11 1011  Gross per 24 hour  Intake    840 ml  Output   2950 ml  Net  -2110 ml    Weight change: -0.303 kg (-10.7 oz)  Exam: Lungs: Clear to auscultation bilaterally. Heart: S1, S2, with a soft systolic murmur. Abdomen: Obese, positive bowel sounds, soft, nontender, nondistended. Extremities: No pedal edema.  Lab Results: Basic Metabolic Panel:  Basename 01/15/11 0615 01/14/11 0443  NA 140 142  K 4.1 3.7  CL 110 110  CO2 27 24  GLUCOSE 92 88  BUN 8 31*  CREATININE 0.68 0.70  CALCIUM 9.8 9.4  MG -- --  PHOS -- --   Liver Function Tests:  Basename 01/14/11 0443 01/13/11 1600  AST 15 20  ALT 14 17  ALKPHOS 113 136*  BILITOT 1.1 0.4  PROT 5.4* 6.0  ALBUMIN 2.8* 3.0*   No results found for this basename: LIPASE:2,AMYLASE:2 in the last 72 hours No results found for this basename: AMMONIA:2 in the last 72 hours CBC:  Basename 01/15/11 0615 01/14/11 2357 01/13/11 1600  WBC 8.5 11.1* --  NEUTROABS -- -- 12.8*  HGB 9.5* 9.7* --  HCT 27.9* 28.1* --  MCV 88.9 88.1 --  PLT 214 256 --   Cardiac Enzymes: No results found for this basename: CKTOTAL:3,CKMB:3,CKMBINDEX:3,TROPONINI:3 in the last 72 hours BNP: No results found for this basename: POCBNP:3 in the last 72 hours D-Dimer: No results found for this basename: DDIMER:2 in the last 72 hours CBG: No results found for this  basename: GLUCAP:6 in the last 72 hours Hemoglobin A1C: No results found for this basename: HGBA1C in the last 72 hours Fasting Lipid Panel: No results found for this basename: CHOL,HDL,LDLCALC,TRIG,CHOLHDL,LDLDIRECT in the last 72 hours Thyroid Function Tests:  Basename 01/13/11 1600  TSH 1.240  T4TOTAL --  FREET4 1.01  T3FREE --  THYROIDAB --   Anemia Panel:  Basename 01/13/11 1600  VITAMINB12 385  FOLATE >20.0  FERRITIN 36  TIBC 330  IRON 144*  RETICCTPCT 2.2   Coagulation:  Basename 01/13/11 1600  LABPROT 14.7  INR 1.13   Urine Drug Screen:  Alcohol Level: No results found for this basename: ETH:2 in the last 72 hours Urinalysis:  Misc. Labs:   Micro: No results found for this or any previous visit (from the past 240 hour(s)).  Studies/Results: No results found.  Medications: I have reviewed the patient's current medications.  Assessment: Active Problems:  GI bleeding  Diverticulosis of colon  Anemia associated with acute blood loss  Prerenal azotemia  Sinus tachycardia  Hypotension due to blood loss  Leukocytosis  Sacral insufficiency fracture  Groin pain, left lower quadrant  Hypothyroidism  Orthostatic lightheadedness  Gastric ulcer with hemorrhage    1. GI bleeding, likely secondary to peptic ulcer disease.  Gastric ulcers per EGD by Dr. Karilyn Cota. Likely NSAID induced.  Acute blood loss anemia. Status post 2 units of packed red blood cell transfusions. Her hemoglobin improved from 8.2-9.9. Her hemoglobin has been stable over the past 24 hours.  Prerenal azotemia. Resolved with IV fluids.  Sacral insufficiency fracture on the left. Physical therapy has been ordered. Analgesics ordered.  Hypothyroidism. Her TSH and free T4 are within normal limits, on replacement therapy.  Initial hypotension with a history of chronic hypertension. Her blood pressure is trending upward and therefore verapamil was restarted at a smaller dose. It will be  titrated accordingly.    Plan: H. pylori serology pending. We'll advance her diet to a heart healthy diet. We'll change Protonix to by mouth. Further recommendations per the GI team. When okay with them, will discharge the patient home.     LOS: 2 days   Rylynn Kobs 01/15/2011, 10:36 AM

## 2011-01-17 LAB — URINE CULTURE
Colony Count: 15000
Culture  Setup Time: 201211230014

## 2011-01-19 ENCOUNTER — Ambulatory Visit (HOSPITAL_COMMUNITY): Admission: RE | Admit: 2011-01-19 | Payer: Medicare Other | Source: Ambulatory Visit

## 2011-01-21 ENCOUNTER — Encounter (HOSPITAL_COMMUNITY): Payer: Self-pay | Admitting: Internal Medicine

## 2011-01-29 ENCOUNTER — Ambulatory Visit (HOSPITAL_COMMUNITY)
Admission: RE | Admit: 2011-01-29 | Discharge: 2011-01-29 | Disposition: A | Discharge status: Home / Self Care | Payer: Medicare Other | Source: Ambulatory Visit | Attending: Family Medicine | Admitting: Family Medicine

## 2011-01-29 DIAGNOSIS — IMO0002 Reserved for concepts with insufficient information to code with codable children: Secondary | ICD-10-CM

## 2011-02-02 ENCOUNTER — Other Ambulatory Visit (INDEPENDENT_AMBULATORY_CARE_PROVIDER_SITE_OTHER): Payer: Self-pay | Admitting: *Deleted

## 2011-02-02 ENCOUNTER — Encounter (INDEPENDENT_AMBULATORY_CARE_PROVIDER_SITE_OTHER): Payer: Self-pay | Admitting: Internal Medicine

## 2011-02-02 ENCOUNTER — Encounter (INDEPENDENT_AMBULATORY_CARE_PROVIDER_SITE_OTHER): Payer: Self-pay | Admitting: *Deleted

## 2011-02-02 ENCOUNTER — Ambulatory Visit (INDEPENDENT_AMBULATORY_CARE_PROVIDER_SITE_OTHER): Payer: Medicare Other | Admitting: Internal Medicine

## 2011-02-02 ENCOUNTER — Telehealth (INDEPENDENT_AMBULATORY_CARE_PROVIDER_SITE_OTHER): Payer: Self-pay | Admitting: *Deleted

## 2011-02-02 VITALS — BP 96/70 | HR 72 | Temp 98.3°F | Ht 61.5 in | Wt 175.9 lb

## 2011-02-02 DIAGNOSIS — K922 Gastrointestinal hemorrhage, unspecified: Secondary | ICD-10-CM

## 2011-02-02 DIAGNOSIS — Z1211 Encounter for screening for malignant neoplasm of colon: Secondary | ICD-10-CM

## 2011-02-02 DIAGNOSIS — K279 Peptic ulcer, site unspecified, unspecified as acute or chronic, without hemorrhage or perforation: Secondary | ICD-10-CM

## 2011-02-02 MED ORDER — BISACODYL-PEG-KCL-NABICAR-NACL 5-210 MG-GM PO KIT: 1.0000 | PACK | Freq: Once | ORAL | Status: DC

## 2011-02-02 NOTE — Patient Instructions (Signed)
Will schedule and EGD/colonoscopy with Dr. :Karilyn Cota. The risks and benefits such as perforation, bleeding, and infection were reviewed with the patient and is agreeable.

## 2011-02-02 NOTE — Progress Notes (Signed)
Subjective:     Patient ID: Sheila [name], female   DOB: 03-17-1940, 70 y.o.   MRN: [id number]  HPI Sheila [name] is a 70 yr old female here for a scheduled visit.  She was admitted to [facility] in November of this year.  She was admitted with melena and found to have a hemoglobin of 9.2.She had been on 81mg  ASA x 2 a day.    She was transfused wit h 2 units of blood while in the hospital. She underwent an EGD during her admission. (See below).  CT scan revealed diverticular disease in athe sigmoid colon but no CT evidence of diverticulitis.  She tells me she is doing pretty good. Appetite is fair. She may have lost 2 pounds. She is not taking NSAIDs.  She is having a BM daily .  BMs are brown. No melena or bright red rectal bleeding. Her last colonoscopy was greater than 10 yrs ago by Dr. Lysbeth Galas in office.  Recent hemoglobin 01/27/2011 9.9.  EGD 01/14/2011 5 mm antral ulcer with clean base.  12 mm deep antral ulcer with 2 tiny pigment spots no active bleeding noted. Suspect patient bled from this ulcer. Endoscopically it appears to be benign.  Recommendations:  No NSAIDs.  Continue PPI at double dose.  H. pylori serology.  Full liquid diet.  He would need repeat EGD in 10 weeks document healing of this ulcer and would also offer screening colonoscopy at that time.  [name],Sheila U 01/14/2011 11:37 AM  Review of Systems see hpi     Current Outpatient Prescriptions  Medication Sig Dispense Refill  . alendronate (FOSAMAX) 70 MG tablet Take 70 mg by mouth every 7 (seven) days. Take with a full glass of water on an empty stomach.       [name] atorvastatin (LIPITOR) 10 MG tablet Take 20 mg by mouth daily. Patients bottle was empty and she should have had a week left       . furosemide (LASIX) 20 MG tablet Take 1 tablet (20 mg total) by mouth daily as needed.      [name] levothyroxine (SYNTHROID, LEVOTHROID) 75 MCG tablet Take 75 mcg by mouth daily.        [name] olanzapine-FLUoxetine (SYMBYAX) 6-25 MG per capsule Take 1  capsule by mouth every morning.        . pantoprazole (PROTONIX) 40 MG tablet Take 1 tablet (40 mg total) by mouth 2 (two) times daily before a meal.  60 tablet  2  . potassium chloride SA (K-DUR,KLOR-CON) 20 MEQ tablet Take 20 mEq by mouth daily.        . verapamil (CALAN-SR) 240 MG CR tablet Take 1 tablet (240 mg total) by mouth every morning.       Past Medical History  Diagnosis Date  . Hypertension   . Depression   . Hypercholesteremia   . Diverticulosis of colon   . Hypothyroidism   . Chest pain 07/2009    Myoveiw stress test negative.  [name] DJD (degenerative joint disease) of knee   . Bell's palsy   . UTI (lower urinary tract infection)   . Gastric ulcer with hemorrhage 01/14/2011   History   Social History  . Marital Status: Divorced    Spouse Name: N/A    Number of Children: N/A  . Years of Education: N/A   Occupational History  . Not on file.   Social History Main Topics  . Smoking status: Former Games developer  . Smokeless tobacco: Current User  Comment: quit yrs and yrs ago when she was very young  . Alcohol Use: No  . Drug Use: Not on file  . Sexually Active: Not on file   Other Topics Concern  . Not on file   Social History Narrative  . No narrative on file   Family Status  Relation Status Death Age  . Mother Deceased     aneurysm  . Father Deceased     lung cancer  . Sister Alive     One in rest home with Alzheimer's and was has CAD  . Brother Deceased     One deceased with metastatic cancer. The other has cancer also   Allergies  Allergen Reactions  . Advil     History of bleeding ulcers - contra-indicated   . Benadryl (Altaryl) Other (See Comments)    Worsening depression   . Codeine Nausea And Vomiting  . Diphenhydramine Hcl   . Morphine And Related Nausea And Vomiting    Objective:   Physical Exam Alert and oriented. Skin warm and dry. Oral mucosa is moist. Natural teeth in fair/poor condition.  Sclera anicteric, conjunctivae is pink.  Thyroid not enlarged. No cervical lymphadenopathy. Lungs clear. Heart regular rate and rhythm.  Abdomen is soft. Bowel sounds are positive. No hepatomegaly. No abdominal masses felt. No tenderness.  No edema to lower extremities. Patient is alert and oriented.     Assessment:   PUD. H. Pylori negative. In need of screening colonoscopy.    Plan:    EGD 10 weeks from 01/14/2011 to document healing of ulcer. Will schedule screening colonoscopy. CBC today     The risks and benefits such as perforation, bleeding, and infection were reviewed with the patient and is agreeable.

## 2011-02-02 NOTE — Telephone Encounter (Signed)
Patient needs half lytely 

## 2011-02-03 ENCOUNTER — Other Ambulatory Visit (HOSPITAL_COMMUNITY): Payer: Self-pay | Admitting: Interventional Radiology

## 2011-02-03 DIAGNOSIS — IMO0002 Reserved for concepts with insufficient information to code with codable children: Secondary | ICD-10-CM

## 2011-02-03 LAB — CBC WITH DIFFERENTIAL/PLATELET
Basophils Absolute: 0.1 10*3/uL (ref 0.0–0.1)
Basophils Relative: 0 % (ref 0–1)
Eosinophils Absolute: 0.1 10*3/uL (ref 0.0–0.7)
Eosinophils Relative: 1 % (ref 0–5)
HCT: 36.1 % (ref 36.0–46.0)
Hemoglobin: 11.4 g/dL — ABNORMAL LOW (ref 12.0–15.0)
Lymphocytes Relative: 29 % (ref 12–46)
Lymphs Abs: 3.3 10*3/uL (ref 0.7–4.0)
MCH: 28.9 pg (ref 26.0–34.0)
MCHC: 31.6 g/dL (ref 30.0–36.0)
MCV: 91.6 fL (ref 78.0–100.0)
Monocytes Absolute: 0.6 10*3/uL (ref 0.1–1.0)
Monocytes Relative: 6 % (ref 3–12)
Neutro Abs: 7.3 10*3/uL (ref 1.7–7.7)
Neutrophils Relative %: 64 % (ref 43–77)
Platelets: 355 10*3/uL (ref 150–400)
RBC: 3.94 MIL/uL (ref 3.87–5.11)
RDW: 14.1 % (ref 11.5–15.5)
WBC: 11.3 10*3/uL — ABNORMAL HIGH (ref 4.0–10.5)

## 2011-02-06 ENCOUNTER — Encounter (HOSPITAL_COMMUNITY): Payer: Self-pay | Admitting: Pharmacy Technician

## 2011-02-06 ENCOUNTER — Other Ambulatory Visit (HOSPITAL_COMMUNITY): Payer: Self-pay | Admitting: Physician Assistant

## 2011-02-09 ENCOUNTER — Ambulatory Visit (HOSPITAL_COMMUNITY)
Admission: RE | Admit: 2011-02-09 | Discharge: 2011-02-09 | Disposition: A | Discharge status: Home / Self Care | Payer: Medicare Other | Source: Ambulatory Visit | Attending: Interventional Radiology | Admitting: Interventional Radiology

## 2011-02-09 DIAGNOSIS — Z8711 Personal history of peptic ulcer disease: Secondary | ICD-10-CM | POA: Insufficient documentation

## 2011-02-09 DIAGNOSIS — Z01812 Encounter for preprocedural laboratory examination: Secondary | ICD-10-CM | POA: Insufficient documentation

## 2011-02-09 DIAGNOSIS — E039 Hypothyroidism, unspecified: Secondary | ICD-10-CM | POA: Insufficient documentation

## 2011-02-09 DIAGNOSIS — M8448XA Pathological fracture, other site, initial encounter for fracture: Secondary | ICD-10-CM | POA: Insufficient documentation

## 2011-02-09 DIAGNOSIS — Z96659 Presence of unspecified artificial knee joint: Secondary | ICD-10-CM | POA: Insufficient documentation

## 2011-02-09 DIAGNOSIS — IMO0002 Reserved for concepts with insufficient information to code with codable children: Secondary | ICD-10-CM

## 2011-02-09 DIAGNOSIS — M810 Age-related osteoporosis without current pathological fracture: Secondary | ICD-10-CM | POA: Insufficient documentation

## 2011-02-09 DIAGNOSIS — M1990 Unspecified osteoarthritis, unspecified site: Secondary | ICD-10-CM | POA: Insufficient documentation

## 2011-02-09 DIAGNOSIS — I10 Essential (primary) hypertension: Secondary | ICD-10-CM | POA: Insufficient documentation

## 2011-02-09 LAB — BASIC METABOLIC PANEL
BUN: 12 mg/dL (ref 6–23)
CO2: 25 mEq/L (ref 19–32)
Calcium: 10.7 mg/dL — ABNORMAL HIGH (ref 8.4–10.5)
Chloride: 103 mEq/L (ref 96–112)
Creatinine, Ser: 0.8 mg/dL (ref 0.50–1.10)
GFR calc Af Amer: 85 mL/min — ABNORMAL LOW (ref >90)
GFR calc non Af Amer: 74 mL/min — ABNORMAL LOW (ref >90)
Glucose, Bld: 107 mg/dL — ABNORMAL HIGH (ref 70–99)
Potassium: 3.8 mEq/L (ref 3.5–5.1)
Sodium: 138 mEq/L (ref 135–145)

## 2011-02-09 LAB — CBC
HCT: 34.6 % — ABNORMAL LOW (ref 36.0–46.0)
Hemoglobin: 11.3 g/dL — ABNORMAL LOW (ref 12.0–15.0)
MCH: 29 pg (ref 26.0–34.0)
MCHC: 32.7 g/dL (ref 30.0–36.0)
MCV: 88.7 fL (ref 78.0–100.0)
Platelets: 257 10*3/uL (ref 150–400)
RBC: 3.9 MIL/uL (ref 3.87–5.11)
RDW: 13.3 % (ref 11.5–15.5)
WBC: 8.1 10*3/uL (ref 4.0–10.5)

## 2011-02-09 LAB — PROTIME-INR
INR: 1 (ref 0.00–1.49)
Prothrombin Time: 13.4 seconds (ref 11.6–15.2)

## 2011-02-09 MED ORDER — FENTANYL CITRATE 0.05 MG/ML IJ SOLN
INTRAMUSCULAR | Status: AC | PRN
  Administered 2011-02-09: 25 ug via INTRAVENOUS

## 2011-02-09 MED ORDER — FENTANYL CITRATE 0.05 MG/ML IJ SOLN
INTRAMUSCULAR | Status: AC
  Filled 2011-02-09: qty 4

## 2011-02-09 MED ORDER — SODIUM CHLORIDE 0.9 % IV SOLN
INTRAVENOUS | Status: DC
  Administered 2011-02-09: 07:00:00 via INTRAVENOUS

## 2011-02-09 MED ORDER — TOBRAMYCIN SULFATE 1.2 G IJ SOLR
INTRAMUSCULAR | Status: AC
  Filled 2011-02-09: qty 1.2

## 2011-02-09 MED ORDER — MIDAZOLAM HCL 5 MG/5ML IJ SOLN
INTRAMUSCULAR | Status: AC | PRN
  Administered 2011-02-09: 1 mg via INTRAVENOUS

## 2011-02-09 MED ORDER — CEFAZOLIN SODIUM 1-5 GM-% IV SOLN
1.0000 g | Freq: Once | INTRAVENOUS | Status: AC
  Administered 2011-02-09: 1 g via INTRAVENOUS
  Filled 2011-02-09: qty 50

## 2011-02-09 MED ORDER — IOHEXOL 300 MG/ML  SOLN
50.0000 mL | Freq: Once | INTRAMUSCULAR | Status: AC | PRN
  Administered 2011-02-09: 6 mL

## 2011-02-09 MED ORDER — MIDAZOLAM HCL 2 MG/2ML IJ SOLN
INTRAMUSCULAR | Status: AC
  Filled 2011-02-09: qty 4

## 2011-02-09 MED ORDER — SODIUM CHLORIDE 0.9 % IV SOLN: INTRAVENOUS | Status: AC

## 2011-02-09 NOTE — H&P (Signed)
Sheila [name] is an 70 y.o. female.   Chief Complaint:  Left sacral insufficiency fracture secondary to fall and osteoporosis - see full consult note below.   *RADIOLOGY REPORT*   Clinical Data: Left sacral insufficiency fracture secondary to injury and osteoporosis.   NEW OUTPATIENT CONSULTATION:  [phone]   Chief complaint:  Left pelvic pain after falling out of bed which radiates around to the left groin.   History of present illness:   The patient is an elderly Caucasian female who lives alone who experienced a fall while  getting out of bed approximately 2 months ago.  The patient has had persistent excruciating pain involving the left pelvis and radiating to the left groin.  Her pain is worse with prolonged sitting and standing for lengthy periods of time. With ambulating the patient experiences significant pain and has resulted in using a walker to assist with ambulation.  Given that the patient has had gastric ulcers in the past with bleeding,  she has been unable to tolerate narcotics and is allowed to take extra strength Tylenol for her pain.  The patient rates her pain as a 10 on a scale of 1-10.  The patient states that the Tylenol make some minimal improvement in her pain.  The only times she is free from pain is when she is lying in the supine position.  The patient denies any bowel or bladder incontinence.  She denies any radicular symptoms down the lower extremities.   Past medical history: 1.  Hypertension 2.  Hypothyroidism 3.  Osteoporosis 4.  History of ulcers with recent GI bleed requiring transfusion. 5. History of Bell's palsy affecting the left face approximately 1 year 6.  History of depression 7.  History of diverticular disease 8.  Osteoarthritis   Surgical history: 1.  Left total knee replacement 2.  Recent endoscopy procedures.   The patient denies any complications with prior anesthesia or with moderate sedation.   Allergies:   1.   Ibuprofen 2.  Vicodin and codeine   3.  Vioxx 4.  Benadryl   SOCIAL HISTORY:   THE PATIENT IS WIDOWED AND LIVES ALONE IN [place]. SHE DOES HAVE A REMOTE HISTORY OF TOBACCO USE MANY YEARS AGO.  THE PATIENT HAS TWO GIRLS,  ALIVE AND WELL BUT DO NOT LIVE LOCALLY. THE PATIENT WAS UNABLE TO COMPLETE A HIGH SCHOOL AND IS ILLITERATE.   REVIEW OF SYSTEMS:   POSITIVE FOR PAIN IN THE LEFT SACRAL AREA RADIATING TO THE LEFT GROIN, POSITIVE FOR ANOREXIA SECONDARY TO PAIN, POSITIVE FOR DEPRESSION AND ANXIETY EASY BRUISING AND HEART BURN.  NEGATIVE FOR DYSURIA, INCONTINENCE OR LOWER EXTREMITY WEAKNESS. NEGATIVE FOR HISTORY OF ANY MALIGNANCY.   Physical examination:   The patient is an elderly white female who appears older than her stated age.  She appears in moderate distress during our evaluation with prolonged sitting.  She does have pain to palpation along the left sacral area.  Full range of motion is noted of all four extremities with slight weakness to the left lower extremity secondary to pain.   Imaging:   CT of the abdomen and pelvis with contrast was performed on 01/02/2011 which revealed an insufficiency fracture involving the left sacrum.  The images were outlined and printed out for the patient for her review.  The procedure for sacroplasty was discussed with the patient in detail.  Potential benefits of the procedure would be that of reduced need for pain medication, improved ambulation and stabilization of  the fracture. Potential complications discussed with the patient were that of infection, bleeding, extravasation of methylmethacrylate, poor results of the procedure and complications with moderate sedation.  The patient and her sister discussed the above and  she is in agreement to proceed if her insurance does not require preauthorization to do so.  The patient presented today with a care giver, a friend who drives the patient  to her appointments.  We  will be in contact with her within the next 24-48 hours to schedule an appropriate time for the patient for her procedure .   Total time spent with the patient was approximately 45 minutes.   Read by: Anselm Pancoast, P.A.-C   Original Report Authenticated By: Oneal Grout, M.D. ** HPI: see H&P from consultation above.  Past Medical History  Diagnosis Date  . Hypertension   . Depression   . Hypercholesteremia   . Diverticulosis of colon   . Hypothyroidism   . Chest pain 07/2009    Myoveiw stress test negative.  [name] DJD (degenerative joint disease) of knee   . Bell's palsy   . UTI (lower urinary tract infection)   . Gastric ulcer with hemorrhage 01/14/2011    Past Surgical History  Procedure Date  . Knee surgery 04/2010.    Total left knee replacement  . Esophagogastroduodenoscopy 01/14/2011    Procedure: ESOPHAGOGASTRODUODENOSCOPY (EGD);  Surgeon: Malissa Hippo, MD;  Location: [facility];  Service: Endoscopy;  Laterality: N/A;    Social History:  reports that she has quit smoking. She uses smokeless tobacco. She reports that she does not drink alcohol. Her drug history not on file.  Allergies:  Allergies  Allergen Reactions  . Advil     History of bleeding ulcers - contra-indicated   . Benadryl (Altaryl) Other (See Comments)    Worsening depression   . Codeine Nausea And Vomiting  . Diphenhydramine Hcl   . Morphine And Related Nausea And Vomiting    Medications Prior to Admission  Medication Sig Dispense Refill  . alendronate (FOSAMAX) 70 MG tablet Take 70 mg by mouth every 7 (seven) days. Saturday. Take with a full glass of water on an empty stomach.      [name] atorvastatin (LIPITOR) 10 MG tablet Take 20 mg by mouth daily. Patients bottle was empty and she should have had a week left       . Bisacodyl-PEG-KCl-NaBicar-NaCl (HALFLYTELY WITH FLAVOR PACKS) 5-210 MG-GM kit Take 1 kit by mouth once.  1 each  0  . furosemide (LASIX) 20 MG tablet Take 20 mg by  mouth daily as needed. For increased fluid       . levothyroxine (SYNTHROID, LEVOTHROID) 75 MCG tablet Take 75 mcg by mouth daily.        [name] olanzapine-FLUoxetine (SYMBYAX) 6-25 MG per capsule Take 1 capsule by mouth every morning.        . pantoprazole (PROTONIX) 40 MG tablet Take 1 tablet (40 mg total) by mouth 2 (two) times daily before a meal.  60 tablet  2  . potassium chloride SA (K-DUR,KLOR-CON) 20 MEQ tablet Take 20 mEq by mouth daily.        . verapamil (CALAN-SR) 240 MG CR tablet Take 1 tablet (240 mg total) by mouth every morning.       Medications Prior to Admission  Medication Dose Route Frequency Provider Last Rate Last Dose  . 0.9 %  sodium chloride infusion   Intravenous Continuous Anselm Pancoast, PA 20 mL/hr  at 02/09/11 3244    . ceFAZolin (ANCEF) IVPB 1 g/50 mL premix  1 g Intravenous Once Anselm Pancoast, PA        Results for orders placed during the hospital encounter of 02/09/11 (from the past 48 hour(s))  CBC     Status: Abnormal   Collection Time   02/09/11  6:32 AM      Component Value Range Comment   WBC 8.1  4.0 - 10.5 (K/uL)    RBC 3.90  3.87 - 5.11 (MIL/uL)    Hemoglobin 11.3 (*) 12.0 - 15.0 (g/dL)    HCT 01.0 (*) 27.2 - 46.0 (%)    MCV 88.7  78.0 - 100.0 (fL)    MCH 29.0  26.0 - 34.0 (pg)    MCHC 32.7  30.0 - 36.0 (g/dL)    RDW 53.6  64.4 - 03.4 (%)    Platelets 257  150 - 400 (K/uL)   BASIC METABOLIC PANEL     Status: Abnormal   Collection Time   02/09/11  6:32 AM      Component Value Range Comment   Sodium 138  135 - 145 (mEq/L)    Potassium 3.8  3.5 - 5.1 (mEq/L)    Chloride 103  96 - 112 (mEq/L)    CO2 25  19 - 32 (mEq/L)    Glucose, Bld 107 (*) 70 - 99 (mg/dL)    BUN 12  6 - 23 (mg/dL)    Creatinine, Ser 7.42  0.50 - 1.10 (mg/dL)    Calcium 59.5 (*) 8.4 - 10.5 (mg/dL)    GFR calc non Af Amer 74 (*) >90 (mL/min)    GFR calc Af Amer 85 (*) >90 (mL/min)   PROTIME-INR     Status: Normal   Collection Time   02/09/11  6:32 AM      Component  Value Range Comment   Prothrombin Time 13.4  11.6 - 15.2 (seconds)    INR 1.00  0.00 - 1.49     No results found.  Review of Systems  Constitutional: Negative.   HENT: Negative.   Eyes: Negative.   Respiratory: Negative.  Negative for cough, hemoptysis and shortness of breath.   Cardiovascular: Negative.  Negative for chest pain, palpitations and leg swelling.  Gastrointestinal: Positive for heartburn. Negative for blood in stool and melena.  Genitourinary: Negative for dysuria, urgency, frequency, hematuria and flank pain.  Musculoskeletal: Positive for back pain and falls.  Skin: Negative.   Neurological: Positive for sensory change.  Endo/Heme/Allergies: Bruises/bleeds easily.  Psychiatric/Behavioral: Positive for depression. Negative for memory loss. The patient is nervous/anxious (188). The patient does not have insomnia.     Blood pressure 134/78, pulse 71, temperature 97 F (36.1 C), temperature source Oral, resp. rate 18, height 5\' 1"  (1.549 m), weight 170 lb (77.111 kg), SpO2 98.00%. Physical Exam  Constitutional: She is oriented to person, place, and time. She appears well-developed and well-nourished. No distress.  HENT:  Head: Normocephalic and atraumatic.  Cardiovascular: Normal rate, regular rhythm and normal heart sounds.  Exam reveals no gallop and no friction rub.   No murmur heard. Respiratory: Effort normal and breath sounds normal. No respiratory distress. She has no wheezes. She has no rales.  GI: Soft. There is no tenderness. There is no rebound.  Musculoskeletal: Normal range of motion. She exhibits tenderness. She exhibits no edema.  Neurological: She is alert and oriented to person, place, and time.  Skin: Skin is warm and dry.  Psychiatric: She has a normal mood and affect. Judgment normal.     Assessment/Plan Sacral insufficiency fracture with pain extending from the posterior left side to the left groin affecting mobility.  Labs WNL to proceed today.   No significant changes in ROS or PE on exam findings today. Written consent obtained after again reviewing procedure and details including risks and benefits.    [place] Krist D 02/09/2011, 7:56 AM

## 2011-02-09 NOTE — Procedures (Signed)
S/P S1 vertebroplasty for painful insufficiency fractures. No acute complications. [name]

## 2011-02-19 ENCOUNTER — Other Ambulatory Visit (HOSPITAL_COMMUNITY): Payer: Self-pay | Admitting: Interventional Radiology

## 2011-02-19 DIAGNOSIS — IMO0002 Reserved for concepts with insufficient information to code with codable children: Secondary | ICD-10-CM

## 2011-02-27 ENCOUNTER — Ambulatory Visit (HOSPITAL_COMMUNITY): Payer: Medicare Other

## 2011-03-24 ENCOUNTER — Other Ambulatory Visit (INDEPENDENT_AMBULATORY_CARE_PROVIDER_SITE_OTHER): Payer: Self-pay | Admitting: *Deleted

## 2011-03-24 DIAGNOSIS — Z1211 Encounter for screening for malignant neoplasm of colon: Secondary | ICD-10-CM

## 2011-03-24 DIAGNOSIS — K279 Peptic ulcer, site unspecified, unspecified as acute or chronic, without hemorrhage or perforation: Secondary | ICD-10-CM

## 2011-03-24 MED ORDER — SODIUM CHLORIDE 0.45 % IV SOLN: Freq: Once | INTRAVENOUS | Status: DC

## 2011-03-25 ENCOUNTER — Ambulatory Visit (HOSPITAL_COMMUNITY): Admission: RE | Admit: 2011-03-25 | Payer: Medicare Other | Source: Ambulatory Visit | Admitting: Internal Medicine

## 2011-03-25 ENCOUNTER — Encounter (HOSPITAL_COMMUNITY): Admission: RE | Payer: Self-pay | Source: Ambulatory Visit

## 2011-03-25 SURGERY — COLONOSCOPY WITH ESOPHAGOGASTRODUODENOSCOPY (EGD)
Anesthesia: Moderate Sedation

## 2011-09-30 ENCOUNTER — Encounter (INDEPENDENT_AMBULATORY_CARE_PROVIDER_SITE_OTHER): Payer: Self-pay | Admitting: *Deleted

## 2012-05-19 ENCOUNTER — Other Ambulatory Visit: Payer: Self-pay | Admitting: Nurse Practitioner

## 2012-05-20 NOTE — Telephone Encounter (Signed)
SEE ALLERGIES. WILL SEND CHART BACK FOR REVIEW.

## 2012-05-20 NOTE — Telephone Encounter (Signed)
Have patient come pick RX up

## 2012-05-27 ENCOUNTER — Telehealth: Payer: Self-pay | Admitting: General Practice

## 2012-05-27 NOTE — Telephone Encounter (Signed)
PLEASE ADVISE.

## 2012-05-30 NOTE — Telephone Encounter (Signed)
She can have labs with next appointment . Have her schedule an appointment. thx

## 2012-05-30 NOTE — Telephone Encounter (Signed)
PT AWARE LABS DUE IN May

## 2012-06-08 ENCOUNTER — Telehealth: Payer: Self-pay | Admitting: Nurse Practitioner

## 2012-06-08 NOTE — Telephone Encounter (Signed)
Patient aware.

## 2012-06-08 NOTE — Telephone Encounter (Signed)
Use antibiotic ointment OTC in nasal passages- run humidifier in house

## 2012-06-08 NOTE — Telephone Encounter (Signed)
PT DAUGHTER CALLED AND SHE IS HAVING NOSE BLEEDS. AND NOT FEELING TO GOOD. DO YOU THINK SHE NEEDS TO BE SEEN.

## 2012-06-16 ENCOUNTER — Ambulatory Visit (INDEPENDENT_AMBULATORY_CARE_PROVIDER_SITE_OTHER): Payer: Medicare Other | Admitting: Pharmacist

## 2012-06-16 VITALS — BP 142/62 | HR 74 | Ht 60.0 in | Wt 171.0 lb

## 2012-06-16 DIAGNOSIS — E785 Hyperlipidemia, unspecified: Secondary | ICD-10-CM | POA: Insufficient documentation

## 2012-06-16 DIAGNOSIS — M1990 Unspecified osteoarthritis, unspecified site: Secondary | ICD-10-CM | POA: Insufficient documentation

## 2012-06-16 DIAGNOSIS — M810 Age-related osteoporosis without current pathological fracture: Secondary | ICD-10-CM

## 2012-06-16 DIAGNOSIS — F411 Generalized anxiety disorder: Secondary | ICD-10-CM | POA: Insufficient documentation

## 2012-06-16 DIAGNOSIS — I10 Essential (primary) hypertension: Secondary | ICD-10-CM | POA: Insufficient documentation

## 2012-06-16 DIAGNOSIS — G510 Bell's palsy: Secondary | ICD-10-CM | POA: Insufficient documentation

## 2012-06-16 DIAGNOSIS — K279 Peptic ulcer, site unspecified, unspecified as acute or chronic, without hemorrhage or perforation: Secondary | ICD-10-CM | POA: Insufficient documentation

## 2012-06-16 MED ORDER — DENOSUMAB 60 MG/ML ~~LOC~~ SOLN
60.0000 mg | Freq: Once | SUBCUTANEOUS | Status: AC
  Administered 2012-06-16: 60 mg via SUBCUTANEOUS

## 2012-06-16 NOTE — Patient Instructions (Addendum)

## 2012-06-16 NOTE — Progress Notes (Signed)
[facility] Current Height: Height: 5' (152.4 cm)       Current Weight: Weight: 171 lb (77.565 kg)       Ethnicity:Caucasian  BP: BP: 142/62 mmHg     HR:  Pulse Rate: 74      HPI: Patient with osteoporosis.  Has been getting prolia 60mg  SQ every 6 months since 06/08/2011.  She previously took alendronate 70mg  qw but BMD continued to decreased while taking alendronate.  She has tolerated Prolia well - last injection was 12/16/2011.                                                              Last Vitamin D Result:  41 (04/15/2011) Last GFR Result:  >89 (04/05/2012)   FH Family history of osteoporosis?  Yes sister Parent with history of hip fracture?  No Family history of breast cancer?  No    Calcium Assessment Calcium Intake  # of servings/day  Calcium mg  Milk (8 oz) 3-4  x  300  = 900-1200mg   Yogurt (4 oz) 0 x  200 = 0  Cheese (1 oz) 0 x  200 = 0  Other Calcium sources   250mg   Ca supplement 500mg  daily = 500mg    Estimated calcium intake per day 1650 - 1950mg     DEXA Results Date of Test T-Score for AP Spine L1-L4 T-Score for Total Left Hip T-Score for Total Right Hip  04/15/2011 -0.2 -1.9 -1.7  05/18/2007 0.0 -1.7 -1.4  12/25/2003 -0.8 -1.5 --        ** T-score for neck of Left Hip was -2.7 on 04/15/2011   Assessment: osteoporosis  Recommendations: 1.  Continue prolia 60mg  SQ q 6 months - inject given in office today 2.  recommend calcium 1200mg  daily either through supplementation   or diet.  Patient doesn't need extra supplementation due to getting enough through diet.  Has h/o elevated serum calcium.   Discontinue OTC calcium supplement 3.  recommend weight bearing exercise - 30 minutes at least 4 days   per week.   4.  Counseled and educated about fall risk and prevention.  Recheck DEXA:  03/2013  Time spent counseling patient:  10 minutes

## 2012-06-28 ENCOUNTER — Telehealth: Payer: Self-pay | Admitting: General Practice

## 2012-06-28 NOTE — Telephone Encounter (Signed)
APPT MADE FOR TOMORROW

## 2012-06-29 ENCOUNTER — Encounter: Payer: Self-pay | Admitting: General Practice

## 2012-06-29 ENCOUNTER — Ambulatory Visit (INDEPENDENT_AMBULATORY_CARE_PROVIDER_SITE_OTHER): Payer: Medicare Other | Admitting: General Practice

## 2012-06-29 VITALS — BP 110/71 | HR 77 | Temp 97.8°F | Ht 60.0 in | Wt 174.0 lb

## 2012-06-29 DIAGNOSIS — J209 Acute bronchitis, unspecified: Secondary | ICD-10-CM

## 2012-06-29 DIAGNOSIS — J069 Acute upper respiratory infection, unspecified: Secondary | ICD-10-CM

## 2012-06-29 DIAGNOSIS — R062 Wheezing: Secondary | ICD-10-CM

## 2012-06-29 MED ORDER — PREDNISONE (PAK) 10 MG PO TABS: ORAL_TABLET | ORAL | Status: DC

## 2012-06-29 MED ORDER — AMOXICILLIN 500 MG PO CAPS: 500.0000 mg | ORAL_CAPSULE | Freq: Two times a day (BID) | ORAL | Status: DC

## 2012-06-29 MED ORDER — ALBUTEROL SULFATE HFA 108 (90 BASE) MCG/ACT IN AERS: 2.0000 | INHALATION_SPRAY | Freq: Four times a day (QID) | RESPIRATORY_TRACT | Status: DC | PRN

## 2012-06-29 NOTE — Patient Instructions (Addendum)
Bronchitis Bronchitis is the body's way of reacting to injury and/or infection (inflammation) of the bronchi. Bronchi are the air tubes that extend from the windpipe into the lungs. If the inflammation becomes severe, it may cause shortness of breath. CAUSES  Inflammation may be caused by:  A virus.  Germs (bacteria).  Dust.  Allergens.  Pollutants and many other irritants. The cells lining the bronchial tree are covered with tiny hairs (cilia). These constantly beat upward, away from the lungs, toward the mouth. This keeps the lungs free of pollutants. When these cells become too irritated and are unable to do their job, mucus begins to develop. This causes the characteristic cough of bronchitis. The cough clears the lungs when the cilia are unable to do their job. Without either of these protective mechanisms, the mucus would settle in the lungs. Then you would develop pneumonia. Smoking is a common cause of bronchitis and can contribute to pneumonia. Stopping this habit is the single most important thing you can do to help yourself. TREATMENT   Your caregiver may prescribe an antibiotic if the cough is caused by bacteria. Also, medicines that open up your airways make it easier to breathe. Your caregiver may also recommend or prescribe an expectorant. It will loosen the mucus to be coughed up. Only take over-the-counter or prescription medicines for pain, discomfort, or fever as directed by your caregiver.  Removing whatever causes the problem (smoking, for example) is critical to preventing the problem from getting worse.  Cough suppressants may be prescribed for relief of cough symptoms.  Inhaled medicines may be prescribed to help with symptoms now and to help prevent problems from returning.  For those with recurrent (chronic) bronchitis, there may be a need for steroid medicines. SEEK IMMEDIATE MEDICAL CARE IF:   During treatment, you develop more pus-like mucus (purulent  sputum).  You have a fever.  Your baby is older than 3 months with a rectal temperature of 102 F (38.9 C) or higher.  Your baby is 57 months old or younger with a rectal temperature of 100.4 F (38 C) or higher.  You become progressively more ill.  You have increased difficulty breathing, wheezing, or shortness of breath. It is necessary to seek immediate medical care if you are elderly or sick from any other disease. MAKE SURE YOU:   Understand these instructions.  Will watch your condition.  Will get help right away if you are not doing well or get worse. Document Released: 02/09/2005 Document Revised: 05/04/2011 Document Reviewed: 12/20/2007 [facility] Patient Information 2013 [place], [place]. Upper Respiratory Infection, Adult An upper respiratory infection (URI) is also sometimes known as the common cold. The upper respiratory tract includes the nose, sinuses, throat, trachea, and bronchi. Bronchi are the airways leading to the lungs. Most people improve within 1 week, but symptoms can last up to 2 weeks. A residual cough may last even longer.  CAUSES Many different viruses can infect the tissues lining the upper respiratory tract. The tissues become irritated and inflamed and often become very moist. Mucus production is also common. A cold is contagious. You can easily spread the virus to others by oral contact. This includes kissing, sharing a glass, coughing, or sneezing. Touching your mouth or nose and then touching a surface, which is then touched by another person, can also spread the virus. SYMPTOMS  Symptoms typically develop 1 to 3 days after you come in contact with a cold virus. Symptoms vary from person to person. They may include:  Runny nose.  Sneezing.  Nasal congestion.  Sinus irritation.  Sore throat.  Loss of voice (laryngitis).  Cough.  Fatigue.  Muscle aches.  Loss of appetite.  Headache.  Low-grade fever. DIAGNOSIS  You might diagnose your  own cold based on familiar symptoms, since most people get a cold 2 to 3 times a year. Your caregiver can confirm this based on your exam. Most importantly, your caregiver can check that your symptoms are not due to another disease such as strep throat, sinusitis, pneumonia, asthma, or epiglottitis. Blood tests, throat tests, and X-rays are not necessary to diagnose a common cold, but they may sometimes be helpful in excluding other more serious diseases. Your caregiver will decide if any further tests are required. RISKS AND COMPLICATIONS  You may be at risk for a more severe case of the common cold if you smoke cigarettes, have chronic heart disease (such as heart failure) or lung disease (such as asthma), or if you have a weakened immune system. The very young and very old are also at risk for more serious infections. Bacterial sinusitis, middle ear infections, and bacterial pneumonia can complicate the common cold. The common cold can worsen asthma and chronic obstructive pulmonary disease (COPD). Sometimes, these complications can require emergency medical care and may be life-threatening. PREVENTION  The best way to protect against getting a cold is to practice good hygiene. Avoid oral or hand contact with people with cold symptoms. Wash your hands often if contact occurs. There is no clear evidence that vitamin C, vitamin E, echinacea, or exercise reduces the chance of developing a cold. However, it is always recommended to get plenty of rest and practice good nutrition. TREATMENT  Treatment is directed at relieving symptoms. There is no cure. Antibiotics are not effective, because the infection is caused by a virus, not by bacteria. Treatment may include:  Increased fluid intake. Sports drinks offer valuable electrolytes, sugars, and fluids.  Breathing heated mist or steam (vaporizer or shower).  Eating chicken soup or other clear broths, and maintaining good nutrition.  Getting plenty of  rest.  Using gargles or lozenges for comfort.  Controlling fevers with ibuprofen or acetaminophen as directed by your caregiver.  Increasing usage of your inhaler if you have asthma. Zinc gel and zinc lozenges, taken in the first 24 hours of the common cold, can shorten the duration and lessen the severity of symptoms. Pain medicines may help with fever, muscle aches, and throat pain. A variety of non-prescription medicines are available to treat congestion and runny nose. Your caregiver can make recommendations and may suggest nasal or lung inhalers for other symptoms.  HOME CARE INSTRUCTIONS   Only take over-the-counter or prescription medicines for pain, discomfort, or fever as directed by your caregiver.  Use a warm mist humidifier or inhale steam from a shower to increase air moisture. This may keep secretions moist and make it easier to breathe.  Drink enough water and fluids to keep your urine clear or pale yellow.  Rest as needed.  Return to work when your temperature has returned to normal or as your caregiver advises. You may need to stay home longer to avoid infecting others. You can also use a face mask and careful hand washing to prevent spread of the virus. SEEK MEDICAL CARE IF:   After the first few days, you feel you are getting worse rather than better.  You need your caregiver's advice about medicines to control symptoms.  You develop chills, worsening shortness  of breath, or brown or red sputum. These may be signs of pneumonia.  You develop yellow or brown nasal discharge or pain in the face, especially when you bend forward. These may be signs of sinusitis.  You develop a fever, swollen neck glands, pain with swallowing, or white areas in the back of your throat. These may be signs of strep throat. SEEK IMMEDIATE MEDICAL CARE IF:   You have a fever.  You develop severe or persistent headache, ear pain, sinus pain, or chest pain.  You develop wheezing, a  prolonged cough, cough up blood, or have a change in your usual mucus (if you have chronic lung disease).  You develop sore muscles or a stiff neck. Document Released: 08/05/2000 Document Revised: 05/04/2011 Document Reviewed: 06/13/2010 [facility] Patient Information 2013 [place], [place].

## 2012-06-29 NOTE — Progress Notes (Signed)
  Subjective:    Patient ID: Sheila [name], female    DOB: 1940/09/22, 72 y.o.   MRN: [id number]  Cough This is a new problem. The current episode started in the past 7 days. The problem has been gradually worsening. The problem occurs every few hours. The cough is non-productive. Associated symptoms include wheezing. Pertinent negatives include no chest pain, chills, fever, headaches, myalgias, nasal congestion, postnasal drip, rash, sore throat or shortness of breath. Exacerbated by: walking around. She has tried nothing for the symptoms. There is no history of asthma or bronchitis.      Review of Systems  Constitutional: Negative for fever and chills.  HENT: Negative for sore throat and postnasal drip.   Respiratory: Positive for cough and wheezing. Negative for chest tightness and shortness of breath.   Cardiovascular: Negative for chest pain.  Genitourinary: Negative for difficulty urinating.  Musculoskeletal: Negative for myalgias.  Skin: Negative.  Negative for rash.  Neurological: Negative for dizziness and headaches.  Psychiatric/Behavioral: Negative.        Objective:   Physical Exam  Constitutional: She is oriented to person, place, and time. She appears well-developed and well-nourished.  HENT:  Head: Normocephalic and atraumatic.  Right Ear: External ear normal.  Left Ear: External ear normal.  Cardiovascular: Normal rate, regular rhythm and normal heart sounds.   No murmur heard. Pulmonary/Chest: Effort normal. No respiratory distress. She has wheezes in the right upper field, the left upper field and the left middle field. She exhibits no tenderness.  Neurological: She is alert and oriented to person, place, and time.  Skin: Skin is warm and dry.  Psychiatric: She has a normal mood and affect.          Assessment & Plan:  Take antibiotics until gone, even if feeling better Increase fluid intake Continue current medications RTO if symptoms worsen or  unresolved Patient verbalized understanding Coralie Keens, FNP-C

## 2012-07-14 ENCOUNTER — Other Ambulatory Visit: Payer: Self-pay

## 2012-07-15 ENCOUNTER — Other Ambulatory Visit: Payer: Medicare Other

## 2012-07-15 ENCOUNTER — Encounter: Payer: Self-pay | Admitting: *Deleted

## 2012-07-15 NOTE — Telephone Encounter (Signed)
errouneous encounter

## 2012-07-19 ENCOUNTER — Ambulatory Visit (INDEPENDENT_AMBULATORY_CARE_PROVIDER_SITE_OTHER): Payer: Medicare Other

## 2012-07-19 ENCOUNTER — Ambulatory Visit (INDEPENDENT_AMBULATORY_CARE_PROVIDER_SITE_OTHER): Payer: Medicare Other | Admitting: Family Medicine

## 2012-07-19 ENCOUNTER — Encounter: Payer: Self-pay | Admitting: Family Medicine

## 2012-07-19 ENCOUNTER — Telehealth: Payer: Self-pay | Admitting: General Practice

## 2012-07-19 VITALS — BP 125/64 | HR 76 | Temp 98.2°F | Ht 60.25 in | Wt 172.0 lb

## 2012-07-19 DIAGNOSIS — R0989 Other specified symptoms and signs involving the circulatory and respiratory systems: Secondary | ICD-10-CM

## 2012-07-19 DIAGNOSIS — R05 Cough: Secondary | ICD-10-CM

## 2012-07-19 DIAGNOSIS — J309 Allergic rhinitis, unspecified: Secondary | ICD-10-CM

## 2012-07-19 DIAGNOSIS — R059 Cough, unspecified: Secondary | ICD-10-CM

## 2012-07-19 MED ORDER — FLUTICASONE PROPIONATE 50 MCG/ACT NA SUSP: NASAL | Status: DC

## 2012-07-19 MED ORDER — AZELASTINE HCL 0.1 % NA SOLN: NASAL | Status: DC

## 2012-07-19 NOTE — Patient Instructions (Addendum)
Mucinex maximum strength over-the-counter 1 to tablet every 12 hours for cough and congestion Use prescription nose sprays as directed Drink plenty of fluid

## 2012-07-19 NOTE — Progress Notes (Signed)
  Subjective:    Patient ID: Sheila [name], female    DOB: Apr 13, 1940, 72 y.o.   MRN: [id number]  HPI Patient presents this afternoon with a dry cough for 24 hour. She denies fever headache sore throat or GI symptoms. The cough is nonproductive. She has been sneezing a lot   Review of Systems  Constitutional: Positive for fatigue. Negative for fever.  HENT: Positive for congestion and sneezing.   Respiratory: Positive for cough (dry), shortness of breath and wheezing.        Objective:   Physical Exam  Vitals reviewed. Constitutional: She is oriented to person, place, and time. She appears well-developed and well-nourished.  HENT:  Head: Normocephalic and atraumatic.  Right Ear: External ear normal.  Left Ear: External ear normal.  Mouth/Throat: Oropharynx is clear and moist.  Turbinate congestion bilaterally  Eyes: Conjunctivae are normal. Right eye exhibits no discharge. Left eye exhibits no discharge. No scleral icterus.  Neck: Normal range of motion. Neck supple. No thyromegaly present.  Cardiovascular: Normal rate, regular rhythm and normal heart sounds.   Pulmonary/Chest: Effort normal and breath sounds normal. No respiratory distress. She has no wheezes. She has no rales.  Dry cough  Lymphadenopathy:    She has no cervical adenopathy.  Neurological: She is alert and oriented to person, place, and time.  Skin: Skin is warm and dry.  Psychiatric: She has a normal mood and affect. Her behavior is normal. Judgment and thought content normal.    [facility] reading (PRIMARY) by  Dr.Moore; chest x-ray no acute abnormality                                       Assessment & Plan:  1. Chest congestion - DG Chest 2 View  2. Allergic rhinitis - fluticasone (FLONASE) 50 MCG/ACT nasal spray; 1-2 sprays each nostril at bedtime  Dispense: 16 g; Refill: 6 - azelastine (ASTELIN) 137 MCG/SPRAY nasal spray; Use 1-2 sprays each nostril at bedtime  Dispense: 30 mL; Refill: 12  3. Cough -  fluticasone (FLONASE) 50 MCG/ACT nasal spray; 1-2 sprays each nostril at bedtime  Dispense: 16 g; Refill: 6  Patient Instructions  Mucinex maximum strength over-the-counter 1 to tablet every 12 hours for cough and congestion Use prescription nose sprays as directed Drink plenty of fluid

## 2012-07-19 NOTE — Telephone Encounter (Signed)
APPT TIME ALREADY PASSED

## 2012-07-20 ENCOUNTER — Telehealth: Payer: Self-pay | Admitting: Family Medicine

## 2012-07-20 ENCOUNTER — Other Ambulatory Visit: Payer: Self-pay | Admitting: Nurse Practitioner

## 2012-07-20 NOTE — Telephone Encounter (Signed)
Patient aware.

## 2012-07-22 NOTE — Telephone Encounter (Signed)
Has appt 07/28/12 with you

## 2012-07-26 ENCOUNTER — Other Ambulatory Visit: Payer: Self-pay | Admitting: *Deleted

## 2012-07-26 MED ORDER — ATORVASTATIN CALCIUM 20 MG PO TABS: 20.0000 mg | ORAL_TABLET | Freq: Every day | ORAL | Status: DC

## 2012-07-27 ENCOUNTER — Ambulatory Visit (INDEPENDENT_AMBULATORY_CARE_PROVIDER_SITE_OTHER): Payer: Medicare Other | Admitting: General Practice

## 2012-07-27 ENCOUNTER — Telehealth: Payer: Self-pay | Admitting: Family Medicine

## 2012-07-27 ENCOUNTER — Encounter: Payer: Self-pay | Admitting: General Practice

## 2012-07-27 VITALS — BP 115/71 | HR 83 | Temp 97.6°F | Ht 60.25 in | Wt 172.0 lb

## 2012-07-27 DIAGNOSIS — N390 Urinary tract infection, site not specified: Secondary | ICD-10-CM

## 2012-07-27 LAB — POCT UA - MICROSCOPIC ONLY
Casts, Ur, LPF, POC: NEGATIVE
Crystals, Ur, HPF, POC: NEGATIVE
Yeast, UA: NEGATIVE

## 2012-07-27 LAB — POCT URINALYSIS DIPSTICK
Bilirubin, UA: NEGATIVE
Glucose, UA: NEGATIVE
Ketones, UA: NEGATIVE
Nitrite, UA: POSITIVE
Spec Grav, UA: 1.02
Urobilinogen, UA: NEGATIVE
pH, UA: 6

## 2012-07-27 MED ORDER — CIPROFLOXACIN HCL 500 MG PO TABS: 500.0000 mg | ORAL_TABLET | Freq: Two times a day (BID) | ORAL | Status: DC

## 2012-07-27 NOTE — Progress Notes (Signed)
  Subjective:    Patient ID: BLIMI GODBY, female    DOB: 10-09-1940, 72 y.o.   MRN: [id number]  Urinary Tract Infection  This is a new problem. The current episode started yesterday. The problem occurs every urination. The problem has been gradually worsening. The quality of the pain is described as aching. The pain is at a severity of 7/10. There has been no fever. She is not sexually active. There is a history of pyelonephritis. Associated symptoms include frequency and urgency. Pertinent negatives include no chills, flank pain or hematuria. She has tried nothing for the symptoms. Her past medical history is significant for recurrent UTIs. There is no history of kidney stones.      Review of Systems  Constitutional: Negative for fever and chills.  Respiratory: Negative for chest tightness, shortness of breath and wheezing.   Cardiovascular: Negative for chest pain and palpitations.  Genitourinary: Positive for dysuria, urgency and frequency. Negative for hematuria, flank pain and difficulty urinating.  Neurological: Negative for dizziness, weakness and headaches.       Objective:   Physical Exam  Constitutional: She is oriented to person, place, and time. She appears well-developed and well-nourished.  Cardiovascular: Normal rate, regular rhythm and normal heart sounds.   Pulmonary/Chest: Effort normal and breath sounds normal.  Abdominal: Soft. Bowel sounds are normal.  Neurological: She is alert and oriented to person, place, and time.  Skin: Skin is warm and dry.  Psychiatric: She has a normal mood and affect.   Results for orders placed in visit on 07/27/12  POCT URINALYSIS DIPSTICK      Result Value Range   Color, UA gold     Clarity, UA clear     Glucose, UA neg     Bilirubin, UA neg     Ketones, UA neg     Spec Grav, UA 1.020     Blood, UA mod     pH, UA 6.0     Protein, UA 4+     Urobilinogen, UA negative     Nitrite, UA positive     Leukocytes, UA large (3+)     POCT UA - MICROSCOPIC ONLY      Result Value Range   WBC, Ur, HPF, POC 30-40     RBC, urine, microscopic 20-35     Bacteria, U Microscopic few     Mucus, UA mod     Epithelial cells, urine per micros few     Crystals, Ur, HPF, POC neg     Casts, Ur, LPF, POC neg     Yeast, UA neg           Assessment & Plan:  1. Urinary tract infection, site not specified - POCT urinalysis dipstick - POCT UA - Microscopic Only - ciprofloxacin (CIPRO) 500 MG tablet; Take 1 tablet (500 mg total) by mouth 2 (two) times daily.  Dispense: 20 tablet; Refill: 0 Increase fluid intake AZO over the counter X2 days Frequent voiding Proper perineal hygiene Culture pending Follow up in 3 weeks for urine recheck and sooner if symptoms worsen Patient verbalized understanding Coralie Keens, FNP-C

## 2012-07-27 NOTE — Patient Instructions (Signed)
Urinary Tract Infection  Urinary tract infections (UTIs) can develop anywhere along your urinary tract. Your urinary tract is your body's drainage system for removing wastes and extra water. Your urinary tract includes two kidneys, two ureters, a bladder, and a urethra. Your kidneys are a pair of bean-shaped organs. Each kidney is about the size of your fist. They are located below your ribs, one on each side of your spine.  CAUSES  Infections are caused by microbes, which are microscopic organisms, including fungi, viruses, and bacteria. These organisms are so small that they can only be seen through a microscope. Bacteria are the microbes that most commonly cause UTIs.  SYMPTOMS   Symptoms of UTIs may vary by age and gender of the patient and by the location of the infection. Symptoms in young women typically include a frequent and intense urge to urinate and a painful, burning feeling in the bladder or urethra during urination. Older women and men are more likely to be tired, shaky, and weak and have muscle aches and abdominal pain. A fever may mean the infection is in your kidneys. Other symptoms of a kidney infection include pain in your back or sides below the ribs, nausea, and vomiting.  DIAGNOSIS  To diagnose a UTI, your caregiver will ask you about your symptoms. Your caregiver also will ask to provide a urine sample. The urine sample will be tested for bacteria and white blood cells. White blood cells are made by your body to help fight infection.  TREATMENT   Typically, UTIs can be treated with medication. Because most UTIs are caused by a bacterial infection, they usually can be treated with the use of antibiotics. The choice of antibiotic and length of treatment depend on your symptoms and the type of bacteria causing your infection.  HOME CARE INSTRUCTIONS   If you were prescribed antibiotics, take them exactly as your caregiver instructs you. Finish the medication even if you feel better after you  have only taken some of the medication.   Drink enough water and fluids to keep your urine clear or pale yellow.   Avoid caffeine, tea, and carbonated beverages. They tend to irritate your bladder.   Empty your bladder often. Avoid holding urine for long periods of time.   Empty your bladder before and after sexual intercourse.   After a bowel movement, women should cleanse from front to back. Use each tissue only once.  SEEK MEDICAL CARE IF:    You have back pain.   You develop a fever.   Your symptoms do not begin to resolve within 3 days.  SEEK IMMEDIATE MEDICAL CARE IF:    You have severe back pain or lower abdominal pain.   You develop chills.   You have nausea or vomiting.   You have continued burning or discomfort with urination.  MAKE SURE YOU:    Understand these instructions.   Will watch your condition.   Will get help right away if you are not doing well or get worse.  Document Released: 11/19/2004 Document Revised: 08/11/2011 Document Reviewed: 03/20/2011  [employer] Patient Information 2014 [employer].

## 2012-07-27 NOTE — Telephone Encounter (Signed)
Appt given for today 

## 2012-07-28 ENCOUNTER — Ambulatory Visit (INDEPENDENT_AMBULATORY_CARE_PROVIDER_SITE_OTHER): Payer: Medicare Other | Admitting: General Practice

## 2012-07-28 ENCOUNTER — Encounter: Payer: Self-pay | Admitting: General Practice

## 2012-07-28 VITALS — BP 112/72 | HR 82 | Temp 98.5°F | Ht 60.25 in | Wt 173.0 lb

## 2012-07-28 DIAGNOSIS — F3289 Other specified depressive episodes: Secondary | ICD-10-CM

## 2012-07-28 DIAGNOSIS — J4520 Mild intermittent asthma, uncomplicated: Secondary | ICD-10-CM

## 2012-07-28 DIAGNOSIS — F32A Depression, unspecified: Secondary | ICD-10-CM

## 2012-07-28 DIAGNOSIS — E785 Hyperlipidemia, unspecified: Secondary | ICD-10-CM

## 2012-07-28 DIAGNOSIS — J45909 Unspecified asthma, uncomplicated: Secondary | ICD-10-CM

## 2012-07-28 DIAGNOSIS — F411 Generalized anxiety disorder: Secondary | ICD-10-CM

## 2012-07-28 DIAGNOSIS — R062 Wheezing: Secondary | ICD-10-CM

## 2012-07-28 DIAGNOSIS — F329 Major depressive disorder, single episode, unspecified: Secondary | ICD-10-CM

## 2012-07-28 DIAGNOSIS — I10 Essential (primary) hypertension: Secondary | ICD-10-CM

## 2012-07-28 DIAGNOSIS — E039 Hypothyroidism, unspecified: Secondary | ICD-10-CM

## 2012-07-28 LAB — THYROID PANEL WITH TSH
Free Thyroxine Index: 3.5 (ref 1.0–3.9)
T3 Uptake: 34.2 % (ref 22.5–37.0)
T4, Total: 10.2 ug/dL (ref 5.0–12.5)
TSH: 1.554 u[IU]/mL (ref 0.350–4.500)

## 2012-07-28 LAB — COMPLETE METABOLIC PANEL WITH GFR
ALT: 18 U/L (ref 0–35)
AST: 19 U/L (ref 0–37)
Albumin: 4 g/dL (ref 3.5–5.2)
Alkaline Phosphatase: 50 U/L (ref 39–117)
BUN: 10 mg/dL (ref 6–23)
CO2: 23 mEq/L (ref 19–32)
Calcium: 10.1 mg/dL (ref 8.4–10.5)
Chloride: 104 mEq/L (ref 96–112)
Creat: 0.7 mg/dL (ref 0.50–1.10)
GFR, Est African American: >89 mL/min
GFR, Est Non African American: 87 mL/min
Glucose, Bld: 89 mg/dL (ref 70–99)
Potassium: 4.3 mEq/L (ref 3.5–5.3)
Sodium: 138 mEq/L (ref 135–145)
Total Bilirubin: 0.8 mg/dL (ref 0.3–1.2)
Total Protein: 6.9 g/dL (ref 6.0–8.3)

## 2012-07-28 LAB — POCT CBC
Granulocyte percent: 68 %G (ref 37–80)
HCT, POC: 40.6 % (ref 37.7–47.9)
Hemoglobin: 13.9 g/dL (ref 12.2–16.2)
Lymph, poc: 3.5 — AB (ref 0.6–3.4)
MCH, POC: 30 pg (ref 27–31.2)
MCHC: 34.2 g/dL (ref 31.8–35.4)
MCV: 87.9 fL (ref 80–97)
MPV: 8.2 fL (ref 0–99.8)
POC Granulocyte: 9.2 — AB (ref 2–6.9)
POC LYMPH PERCENT: 25.8 %L (ref 10–50)
Platelet Count, POC: 332 10*3/uL (ref 142–424)
RBC: 4.6 M/uL (ref 4.04–5.48)
RDW, POC: 14 %
WBC: 13.6 10*3/uL — AB (ref 4.6–10.2)

## 2012-07-28 MED ORDER — ALPRAZOLAM 0.25 MG PO TABS: 0.2500 mg | ORAL_TABLET | Freq: Two times a day (BID) | ORAL | Status: DC | PRN

## 2012-07-28 NOTE — Patient Instructions (Signed)

## 2012-07-28 NOTE — Progress Notes (Signed)
  Subjective:    Patient ID: Sheila [name], female    DOB: 1940-03-03, 72 y.o.   MRN: [id number]  HPI Presents today for 3 month follow up of chronic health conditions. Hypertension, chronic back pain, depression, hypothyroidism, seasonal allergies, anxiety, and asthma. Reports taking medications as prescribed. Reports mood being controlled with medications.     Review of Systems  Constitutional: Negative for fever and chills.  HENT: Negative for neck pain and neck stiffness.   Respiratory: Positive for wheezing. Negative for cough, chest tightness and shortness of breath.        Reports wheezing occurs at most one monthly  Cardiovascular: Negative for chest pain, palpitations and leg swelling.  Gastrointestinal: Negative for abdominal pain.  Genitourinary: Positive for dysuria, urgency and frequency. Negative for flank pain and difficulty urinating.       Currently taking antibiotics   Musculoskeletal: Positive for back pain.       Chronic back pain   Skin: Negative.   Neurological: Negative for dizziness, syncope, weakness, numbness and headaches.  Psychiatric/Behavioral: Negative for suicidal ideas and self-injury. The patient is not nervous/anxious.   All other systems reviewed and are negative.       Objective:   Physical Exam  Constitutional: She is oriented to person, place, and time. She appears well-developed and well-nourished.  HENT:  Head: Normocephalic and atraumatic.  Right Ear: External ear normal.  Left Ear: External ear normal.  Mouth/Throat: Oropharynx is clear and moist.  Cardiovascular: Regular rhythm and normal heart sounds.   Pulmonary/Chest: Effort normal and breath sounds normal. No respiratory distress. She exhibits no tenderness.  Abdominal: Soft. Bowel sounds are normal. She exhibits no distension. There is no tenderness.  Neurological: She is alert and oriented to person, place, and time.  Skin: Skin is warm and dry.  Psychiatric: She has a normal  mood and affect.          Assessment & Plan:  1. Unspecified hypothyroidism - Vitamin D 25 hydroxy - Thyroid Panel With TSH  2. Other and unspecified hyperlipidemia - POCT CBC - NMR Lipoprofile with Lipids - COMPLETE METABOLIC PANEL WITH GFR  3. Generalized anxiety disorder - ALPRAZolam (XANAX) 0.25 MG tablet; Take 1 tablet (0.25 mg total) by mouth 2 (two) times daily as needed for sleep.  Dispense: 60 tablet; Refill: 0  4. Essential hypertension, benign  5. Asthma, chronic, mild intermittent, uncomplicated  6. Depression Continue current medications Discussed healthy eating habits Discussed importance of regular exercise Patient verbalized understanding Coralie Keens, FNP-C

## 2012-07-29 LAB — NMR LIPOPROFILE WITH LIPIDS
Cholesterol, Total: 154 mg/dL (ref <200)
HDL Particle Number: 32.9 umol/L (ref >=30.5)
HDL Size: 8.6 nm — ABNORMAL LOW (ref >=9.2)
HDL-C: 44 mg/dL (ref >=40)
LDL (calc): 86 mg/dL (ref <100)
LDL Particle Number: 1578 nmol/L — ABNORMAL HIGH (ref <1000)
LDL Size: 20.3 nm — ABNORMAL LOW (ref >20.5)
LP-IR Score: 62 — ABNORMAL HIGH (ref <=45)
Large HDL-P: 2.9 umol/L — ABNORMAL LOW (ref >=4.8)
Large VLDL-P: 1.1 nmol/L (ref <=2.7)
Small LDL Particle Number: 1033 nmol/L — ABNORMAL HIGH (ref <=527)
Triglycerides: 120 mg/dL (ref <150)
VLDL Size: 46.2 nm (ref <=46.6)

## 2012-07-29 LAB — VITAMIN D 25 HYDROXY (VIT D DEFICIENCY, FRACTURES): Vit D, 25-Hydroxy: 31 ng/mL (ref 30–89)

## 2012-08-02 ENCOUNTER — Encounter: Payer: Self-pay | Admitting: General Practice

## 2012-08-02 NOTE — Telephone Encounter (Signed)
Pharmacist says she was getting  Xanax .5 each month, (last 07-20-12). Please re-do script for them instead of verbal change on phone. Thanks.

## 2012-08-03 ENCOUNTER — Other Ambulatory Visit: Payer: Self-pay | Admitting: General Practice

## 2012-08-03 DIAGNOSIS — F411 Generalized anxiety disorder: Secondary | ICD-10-CM

## 2012-08-03 MED ORDER — ALPRAZOLAM 0.5 MG PO TABS: 0.5000 mg | ORAL_TABLET | Freq: Two times a day (BID) | ORAL | Status: DC | PRN

## 2012-08-03 NOTE — Telephone Encounter (Signed)
Script written. thx

## 2012-08-03 NOTE — Telephone Encounter (Signed)
Pt aware,new rx ready.

## 2012-08-11 ENCOUNTER — Encounter: Payer: Self-pay | Admitting: General Practice

## 2012-08-11 ENCOUNTER — Ambulatory Visit (INDEPENDENT_AMBULATORY_CARE_PROVIDER_SITE_OTHER): Payer: Medicare Other | Admitting: General Practice

## 2012-08-11 VITALS — BP 112/66 | HR 81 | Temp 98.0°F | Ht 60.25 in | Wt 177.0 lb

## 2012-08-11 DIAGNOSIS — R350 Frequency of micturition: Secondary | ICD-10-CM

## 2012-08-11 DIAGNOSIS — J209 Acute bronchitis, unspecified: Secondary | ICD-10-CM

## 2012-08-11 DIAGNOSIS — R062 Wheezing: Secondary | ICD-10-CM

## 2012-08-11 DIAGNOSIS — R059 Cough, unspecified: Secondary | ICD-10-CM

## 2012-08-11 DIAGNOSIS — R05 Cough: Secondary | ICD-10-CM

## 2012-08-11 LAB — POCT URINALYSIS DIPSTICK
Bilirubin, UA: NEGATIVE
Glucose, UA: NEGATIVE
Ketones, UA: NEGATIVE
Leukocytes, UA: NEGATIVE
Nitrite, UA: NEGATIVE
Protein, UA: NEGATIVE
Spec Grav, UA: 1.015
Urobilinogen, UA: NEGATIVE
pH, UA: 5

## 2012-08-11 LAB — POCT UA - MICROSCOPIC ONLY
Crystals, Ur, HPF, POC: NEGATIVE
WBC, Ur, HPF, POC: NEGATIVE
Yeast, UA: NEGATIVE

## 2012-08-11 MED ORDER — BENZONATATE 100 MG PO CAPS: 100.0000 mg | ORAL_CAPSULE | Freq: Two times a day (BID) | ORAL | Status: DC | PRN

## 2012-08-11 MED ORDER — ALBUTEROL SULFATE HFA 108 (90 BASE) MCG/ACT IN AERS: 2.0000 | INHALATION_SPRAY | Freq: Four times a day (QID) | RESPIRATORY_TRACT | Status: DC | PRN

## 2012-08-11 MED ORDER — METHYLPREDNISOLONE ACETATE 80 MG/ML IJ SUSP
80.0000 mg | Freq: Once | INTRAMUSCULAR | Status: AC
  Administered 2012-08-11: 80 mg via INTRAMUSCULAR

## 2012-08-11 NOTE — Progress Notes (Signed)
  Subjective:    Patient ID: Sheila [name], female    DOB: Mar 08, 1940, 72 y.o.   MRN: [id number]  HPI Reports UTI symptoms have resolved. Reports coughing and wheezing periodically. Reports coughing and wheezing is worse with walking to mailbox and upon arrival in am. She reports taking inhaler as prescribed and other medications.     Review of Systems  Constitutional: Negative for fever and chills.  HENT: Negative for rhinorrhea, postnasal drip and sinus pressure.   Eyes: Negative for redness, itching and visual disturbance.  Respiratory: Positive for cough. Negative for chest tightness and shortness of breath.        Whitish sputum with coughing  Cardiovascular: Negative for chest pain and palpitations.  Genitourinary: Negative for hematuria, flank pain, difficulty urinating, pelvic pain and dyspareunia.  All other systems reviewed and are negative.       Objective:   Physical Exam  Constitutional: She appears well-developed and well-nourished.  HENT:  Head: Normocephalic and atraumatic.  Right Ear: External ear normal.  Left Ear: External ear normal.  Mouth/Throat: Oropharynx is clear and moist.  Cardiovascular: Normal rate, regular rhythm and normal heart sounds.   Pulmonary/Chest: Effort normal. She has wheezes.  Wheezing noted to bilateral upper lobes  Neurological: She is alert.  Skin: Skin is warm and dry.  Psychiatric: She has a normal mood and affect.   Results for orders placed in visit on 08/11/12  POCT URINALYSIS DIPSTICK      Result Value Range   Color, UA yellow     Clarity, UA clear     Glucose, UA neg     Bilirubin, UA neg     Ketones, UA neg     Spec Grav, UA 1.015     Blood, UA trace     pH, UA 5.0     Protein, UA neg     Urobilinogen, UA negative     Nitrite, UA neg     Leukocytes, UA Negative    POCT UA - MICROSCOPIC ONLY      Result Value Range   WBC, Ur, HPF, POC neg     RBC, urine, microscopic 1-5     Bacteria, U Microscopic occ     Mucus,  UA trace     Epithelial cells, urine per micros occ     Crystals, Ur, HPF, POC neg     Casts, Ur, LPF, POC occ     Yeast, UA neg            Assessment & Plan:  1. Frequency of urination - POCT urinalysis dipstick - POCT UA - Microscopic Only  2. Wheezing - albuterol (PROVENTIL HFA;VENTOLIN HFA) 108 (90 BASE) MCG/ACT inhaler; Inhale 2 puffs into the lungs every 6 (six) hours as needed for wheezing.  Dispense: 1 Inhaler; Refill: 0 -humidify air   3. Acute bronchitis - methylPREDNISolone acetate (DEPO-MEDROL) injection 80 mg; Inject 1 mL (80 mg total) into the muscle once.  4. Cough - benzonatate (TESSALON) 100 MG capsule; Take 1 capsule (100 mg total) by mouth 2 (two) times daily as needed for cough.  Dispense: 20 capsule; Refill: 0 -adequate fluid intake -proper handwashing RTO in 2 weeks and sooner if symptoms worsen -Patient and caregiver verbalized understanding -Coralie Keens, FNP-C

## 2012-08-11 NOTE — Patient Instructions (Addendum)

## 2012-08-16 ENCOUNTER — Other Ambulatory Visit: Payer: Self-pay | Admitting: General Practice

## 2012-08-16 ENCOUNTER — Other Ambulatory Visit: Payer: Self-pay | Admitting: Nurse Practitioner

## 2012-08-18 ENCOUNTER — Telehealth: Payer: Self-pay | Admitting: *Deleted

## 2012-08-18 ENCOUNTER — Other Ambulatory Visit: Payer: Self-pay | Admitting: General Practice

## 2012-08-18 DIAGNOSIS — F411 Generalized anxiety disorder: Secondary | ICD-10-CM

## 2012-08-18 MED ORDER — ALPRAZOLAM 0.5 MG PO TABS: 0.5000 mg | ORAL_TABLET | Freq: Two times a day (BID) | ORAL | Status: DC | PRN

## 2012-08-18 NOTE — Telephone Encounter (Signed)
LMOM,Xanax rx ready for pt to come pick up.

## 2012-08-18 NOTE — Telephone Encounter (Signed)
Last seen 08/11/12, last filled 07/20/12. If approved, have nurse call into [facility]

## 2012-08-22 NOTE — Telephone Encounter (Signed)
This encounter was created in error - please disregard.

## 2012-08-22 NOTE — Telephone Encounter (Signed)
Rx called in 

## 2012-08-22 NOTE — Telephone Encounter (Signed)
Still need to call in Tramadol

## 2012-08-22 NOTE — Telephone Encounter (Signed)
Med called to pharm 

## 2012-08-22 NOTE — Telephone Encounter (Signed)
Please call this in for patient. thx

## 2012-08-30 ENCOUNTER — Telehealth: Payer: Self-pay | Admitting: General Practice

## 2012-08-30 NOTE — Telephone Encounter (Signed)
HAVING VAGINAL ITCHING AGAIN. COMPLETED MEDICINE, CHANGED DETERGENT. CAN YOU CALL SOMETHING IN OR DOES SHE NTBS.

## 2012-08-31 ENCOUNTER — Other Ambulatory Visit: Payer: Self-pay | Admitting: General Practice

## 2012-08-31 DIAGNOSIS — N760 Acute vaginitis: Secondary | ICD-10-CM

## 2012-08-31 MED ORDER — FLUCONAZOLE 150 MG PO TABS: 150.0000 mg | ORAL_TABLET | Freq: Once | ORAL | Status: DC

## 2012-08-31 NOTE — Telephone Encounter (Signed)
Please inform patient that diflucan script sent to pharmacy for vaginal itching. If she wants to be seen please have her make appointment. thx

## 2012-09-01 NOTE — Telephone Encounter (Signed)
Patient aware rx sent to pharmacy.  

## 2012-09-17 ENCOUNTER — Other Ambulatory Visit: Payer: Self-pay | Admitting: Nurse Practitioner

## 2012-09-17 ENCOUNTER — Other Ambulatory Visit: Payer: Self-pay | Admitting: Family Medicine

## 2012-09-20 NOTE — Telephone Encounter (Signed)
Please advise 

## 2012-09-20 NOTE — Telephone Encounter (Signed)
Advise

## 2012-09-20 NOTE — Telephone Encounter (Signed)
Tramadol last filled 08/16/12, last seen 08/11/12. Rx will print, notify pt when ready

## 2012-09-21 ENCOUNTER — Other Ambulatory Visit: Payer: Self-pay | Admitting: General Practice

## 2012-09-21 MED ORDER — LEVOTHYROXINE SODIUM 75 MCG PO TABS: 75.0000 ug | ORAL_TABLET | Freq: Every day | ORAL | Status: DC

## 2012-09-21 MED ORDER — TRAMADOL HCL 50 MG PO TABS: 50.0000 mg | ORAL_TABLET | Freq: Two times a day (BID) | ORAL | Status: DC | PRN

## 2012-09-21 MED ORDER — CITALOPRAM HYDROBROMIDE 20 MG PO TABS: 20.0000 mg | ORAL_TABLET | Freq: Every day | ORAL | Status: DC

## 2012-09-21 NOTE — Telephone Encounter (Signed)
Please inform that script is ready for pick up. thx 

## 2012-09-21 NOTE — Telephone Encounter (Signed)
Scripts sent to pharmacy. thx

## 2012-09-21 NOTE — Telephone Encounter (Signed)
Please review and address 2 new script request.  Thanks

## 2012-09-21 NOTE — Telephone Encounter (Signed)
Script ready for patient pick up.

## 2012-09-22 ENCOUNTER — Telehealth: Payer: Self-pay | Admitting: Nurse Practitioner

## 2012-09-22 ENCOUNTER — Ambulatory Visit (INDEPENDENT_AMBULATORY_CARE_PROVIDER_SITE_OTHER): Payer: Medicare Other | Admitting: Family Medicine

## 2012-09-22 ENCOUNTER — Other Ambulatory Visit: Payer: Self-pay | Admitting: Nurse Practitioner

## 2012-09-22 ENCOUNTER — Encounter: Payer: Self-pay | Admitting: Family Medicine

## 2012-09-22 ENCOUNTER — Other Ambulatory Visit: Payer: Self-pay | Admitting: *Deleted

## 2012-09-22 ENCOUNTER — Other Ambulatory Visit: Payer: Self-pay | Admitting: General Practice

## 2012-09-22 VITALS — BP 137/83 | HR 113 | Temp 97.5°F | Ht 60.0 in | Wt 178.0 lb

## 2012-09-22 DIAGNOSIS — J209 Acute bronchitis, unspecified: Secondary | ICD-10-CM

## 2012-09-22 DIAGNOSIS — R110 Nausea: Secondary | ICD-10-CM

## 2012-09-22 DIAGNOSIS — F411 Generalized anxiety disorder: Secondary | ICD-10-CM

## 2012-09-22 MED ORDER — AZITHROMYCIN 250 MG PO TABS: ORAL_TABLET | ORAL | Status: DC

## 2012-09-22 MED ORDER — BENZONATATE 100 MG PO CAPS: 100.0000 mg | ORAL_CAPSULE | Freq: Two times a day (BID) | ORAL | Status: DC | PRN

## 2012-09-22 MED ORDER — PROMETHAZINE HCL 25 MG PO TABS: 25.0000 mg | ORAL_TABLET | Freq: Three times a day (TID) | ORAL | Status: DC | PRN

## 2012-09-22 NOTE — Telephone Encounter (Signed)
Emetrol OTC

## 2012-09-22 NOTE — Telephone Encounter (Signed)
Please call into pharmacy. thx 

## 2012-09-22 NOTE — Telephone Encounter (Signed)
Please advise 

## 2012-09-22 NOTE — Telephone Encounter (Signed)
Pt aware.

## 2012-09-22 NOTE — Progress Notes (Signed)
  Subjective:    Patient ID: Sheila [name], female    DOB: [birth year], 72 y.o.   MRN: [id number]  HPI  This 72 y.o. female presents for evaluation of nausea.  She has been coughing for  A week and took some cough medicine with codeine and she is throwing up and  Is nauseated.  Review of Systems C/o cough and NV   No chest pain, SOB, HA, dizziness, vision change, diarrhea, constipation, dysuria, urinary urgency or frequency, myalgias, arthralgias or rash.  Objective:   Physical Exam Vital signs noted  Well developed well nourished female.  HEENT - Head atraumatic Normocephalic                Eyes - PERRLA, Conjuctiva - clear Sclera- Clear EOMI                Ears - EAC's Wnl TM's Wnl Gross Hearing WNL                Nose - Nares patent                 Throat - oropharanx wnl Respiratory - Scattered wheezes bilateral. Cardiac - RRR S1 and S2 without murmur GI - Abdomen soft Nontender and bowel sounds active x 4 Extremities - No edema. Neuro - Grossly intact.       Assessment & Plan:  Acute bronchitis - Plan: benzonatate (TESSALON) 100 MG capsule, azithromycin (ZITHROMAX) 250 MG tablet  Nausea alone - Plan: promethazine (PHENERGAN) 25 MG tablet po qid prn and follow up prn. Advised patient to not take cough medicine with hydrocodone because it will make her nauseated.

## 2012-09-22 NOTE — Patient Instructions (Signed)

## 2012-09-22 NOTE — Telephone Encounter (Signed)
Pt turned in hard copy of xanax and ultram to [facility].

## 2012-09-22 NOTE — Telephone Encounter (Signed)
Patient last seen in office on 08-11-12 for an acute visit. Rx last filled on 08-16-12 for #[id number]. Please advise. If approved please have nurse phone in to [facility]. ([name])

## 2012-09-23 ENCOUNTER — Other Ambulatory Visit: Payer: Self-pay

## 2012-09-23 DIAGNOSIS — R062 Wheezing: Secondary | ICD-10-CM

## 2012-09-23 MED ORDER — ALBUTEROL SULFATE HFA 108 (90 BASE) MCG/ACT IN AERS: 2.0000 | INHALATION_SPRAY | Freq: Four times a day (QID) | RESPIRATORY_TRACT | Status: DC | PRN

## 2012-09-23 NOTE — Telephone Encounter (Signed)
Last seen 08/11/12  [name]  If approved print and route to nurse

## 2012-09-23 NOTE — Telephone Encounter (Signed)
Pt aware.

## 2012-09-23 NOTE — Telephone Encounter (Signed)
Looks like this request was filled by another provide. thx

## 2012-09-28 ENCOUNTER — Telehealth: Payer: Self-pay | Admitting: *Deleted

## 2012-09-28 NOTE — Telephone Encounter (Signed)
Called pt to let her know rx for ultram ready but she said she got it last week. Rx will be shredded.

## 2012-09-28 NOTE — Telephone Encounter (Signed)
Please inform that script is ready for pick up. thx 

## 2012-10-12 ENCOUNTER — Telehealth: Payer: Self-pay | Admitting: General Practice

## 2012-10-12 DIAGNOSIS — N760 Acute vaginitis: Secondary | ICD-10-CM

## 2012-10-12 MED ORDER — FLUCONAZOLE 150 MG PO TABS: 150.0000 mg | ORAL_TABLET | Freq: Once | ORAL | Status: DC

## 2012-10-12 NOTE — Telephone Encounter (Signed)
Patient notified

## 2012-10-12 NOTE — Telephone Encounter (Signed)
Diflucan rx sent to pharamcy- use monistat OTC on the outside

## 2012-10-18 ENCOUNTER — Other Ambulatory Visit: Payer: Self-pay | Admitting: Family Medicine

## 2012-10-19 NOTE — Telephone Encounter (Signed)
Last seen 08/11/12  [name]

## 2012-10-25 ENCOUNTER — Other Ambulatory Visit: Payer: Self-pay

## 2012-10-25 DIAGNOSIS — F411 Generalized anxiety disorder: Secondary | ICD-10-CM

## 2012-10-25 NOTE — Telephone Encounter (Signed)
Last seen 08/11/12  [name]  If approved phone in and route

## 2012-10-26 MED ORDER — ALPRAZOLAM 0.5 MG PO TABS: 0.5000 mg | ORAL_TABLET | Freq: Two times a day (BID) | ORAL | Status: DC | PRN

## 2012-10-26 NOTE — Telephone Encounter (Signed)
Please phone in

## 2012-10-27 ENCOUNTER — Telehealth: Payer: Self-pay | Admitting: General Practice

## 2012-10-27 NOTE — Telephone Encounter (Signed)
Please advise 

## 2012-10-27 NOTE — Telephone Encounter (Signed)
Script called in and M. [place] gave permission for 1 refill on tramadol and tessalon pearls.

## 2012-11-01 ENCOUNTER — Other Ambulatory Visit: Payer: Self-pay | Admitting: *Deleted

## 2012-11-01 NOTE — Telephone Encounter (Signed)
Last filled 09/22/12, last seen 07/28/12. Rx will print, route to pool so they can call pt to pickup

## 2012-11-03 ENCOUNTER — Telehealth: Payer: Self-pay | Admitting: *Deleted

## 2012-11-03 ENCOUNTER — Other Ambulatory Visit: Payer: Self-pay | Admitting: General Practice

## 2012-11-03 DIAGNOSIS — B373 Candidiasis of vulva and vagina: Secondary | ICD-10-CM

## 2012-11-03 DIAGNOSIS — B3731 Acute candidiasis of vulva and vagina: Secondary | ICD-10-CM

## 2012-11-03 MED ORDER — TRAMADOL HCL 50 MG PO TABS: 50.0000 mg | ORAL_TABLET | Freq: Two times a day (BID) | ORAL | Status: DC | PRN

## 2012-11-03 NOTE — Telephone Encounter (Signed)
Lm, rx of pain med ready.

## 2012-11-04 MED ORDER — CLOTRIMAZOLE 1 % VA CREA: 1.0000 | TOPICAL_CREAM | Freq: Every day | VAGINAL | Status: AC

## 2012-11-06 NOTE — Telephone Encounter (Signed)
Script sent  

## 2012-11-14 ENCOUNTER — Other Ambulatory Visit: Payer: Self-pay | Admitting: Nurse Practitioner

## 2012-11-14 ENCOUNTER — Other Ambulatory Visit: Payer: Self-pay | Admitting: Family Medicine

## 2012-11-15 ENCOUNTER — Encounter: Payer: Self-pay | Admitting: General Practice

## 2012-11-15 ENCOUNTER — Ambulatory Visit (INDEPENDENT_AMBULATORY_CARE_PROVIDER_SITE_OTHER): Payer: Medicare Other | Admitting: General Practice

## 2012-11-15 VITALS — BP 117/68 | HR 70 | Temp 97.1°F | Ht 60.0 in | Wt 186.5 lb

## 2012-11-15 DIAGNOSIS — I10 Essential (primary) hypertension: Secondary | ICD-10-CM

## 2012-11-15 DIAGNOSIS — A499 Bacterial infection, unspecified: Secondary | ICD-10-CM

## 2012-11-15 DIAGNOSIS — Z09 Encounter for follow-up examination after completed treatment for conditions other than malignant neoplasm: Secondary | ICD-10-CM

## 2012-11-15 DIAGNOSIS — Z23 Encounter for immunization: Secondary | ICD-10-CM

## 2012-11-15 DIAGNOSIS — L299 Pruritus, unspecified: Secondary | ICD-10-CM

## 2012-11-15 DIAGNOSIS — E785 Hyperlipidemia, unspecified: Secondary | ICD-10-CM

## 2012-11-15 DIAGNOSIS — B9689 Other specified bacterial agents as the cause of diseases classified elsewhere: Secondary | ICD-10-CM

## 2012-11-15 DIAGNOSIS — E039 Hypothyroidism, unspecified: Secondary | ICD-10-CM

## 2012-11-15 DIAGNOSIS — E119 Type 2 diabetes mellitus without complications: Secondary | ICD-10-CM

## 2012-11-15 DIAGNOSIS — N760 Acute vaginitis: Secondary | ICD-10-CM

## 2012-11-15 LAB — POCT CBC
Granulocyte percent: 50.7 %G (ref 37–80)
HCT, POC: 38.2 % (ref 37.7–47.9)
Hemoglobin: 13.2 g/dL (ref 12.2–16.2)
Lymph, poc: 4.4 — AB (ref 0.6–3.4)
MCH, POC: 28.8 pg (ref 27–31.2)
MCHC: 34.6 g/dL (ref 31.8–35.4)
MCV: 83.2 fL (ref 80–97)
MPV: 7.9 fL (ref 0–99.8)
POC Granulocyte: 5.3 (ref 2–6.9)
POC LYMPH PERCENT: 42.2 %L (ref 10–50)
Platelet Count, POC: 324 10*3/uL (ref 142–424)
RBC: 4.6 M/uL (ref 4.04–5.48)
RDW, POC: 13.8 %
WBC: 10.5 10*3/uL — AB (ref 4.6–10.2)

## 2012-11-15 LAB — POCT WET PREP (WET MOUNT)

## 2012-11-15 LAB — POCT GLYCOSYLATED HEMOGLOBIN (HGB A1C): Hemoglobin A1C: 4.9

## 2012-11-15 MED ORDER — METRONIDAZOLE 500 MG PO TABS: 500.0000 mg | ORAL_TABLET | Freq: Two times a day (BID) | ORAL | Status: DC

## 2012-11-15 MED ORDER — FLUCONAZOLE 150 MG PO TABS: 150.0000 mg | ORAL_TABLET | Freq: Once | ORAL | Status: DC

## 2012-11-15 NOTE — Progress Notes (Signed)
  Subjective:    Patient ID: Sheila [name], female    DOB: January 21, 1941, 72 y.o.   MRN: [id number]  HPI Patient presents today for 3 month chronic health visit. She has a history of anxiety, hyperlipidemia, depression, chronic back and leg pain, hypothyroidism, hypertension. She reports medications for anxiety and depression are effective. Reports mood swings are well controlled. Patient reports vaginal itching periodically. Denies discharge or odor.     Review of Systems  Constitutional: Negative for fever and chills.  HENT: Negative for neck pain and neck stiffness.   Respiratory: Negative for chest tightness and shortness of breath.   Cardiovascular: Negative for chest pain and palpitations.  Gastrointestinal: Negative for abdominal pain.  Genitourinary: Negative for dysuria and difficulty urinating.       Vaginal itching   Musculoskeletal: Positive for back pain.       Chronic back and leg pain  Neurological: Negative for dizziness, weakness and headaches.       Objective:   Physical Exam  Constitutional: She is oriented to person, place, and time. She appears well-developed and well-nourished.  HENT:  Head: Normocephalic and atraumatic.  Right Ear: External ear normal.  Left Ear: External ear normal.  Eyes: EOM are normal. Pupils are equal, round, and reactive to light.  Neck: Normal range of motion. Neck supple.  Cardiovascular: Normal rate, regular rhythm and normal heart sounds.   Pulmonary/Chest: Effort normal and breath sounds normal.  Abdominal: Soft. Bowel sounds are normal.  Neurological: She is alert and oriented to person, place, and time.  Skin: Skin is warm and dry.  Psychiatric: She has a normal mood and affect.          Assessment & Plan:  1. Itching - POCT Wet Prep [facility])  2. Unspecified hypothyroidism - Thyroid Panel With TSH  3. Diabetes - POCT glycosylated hemoglobin (Hb A1C)  4. Hypertension - CMP14+EGFR  5. Other and unspecified  hyperlipidemia - NMR, lipoprofile  6. Follow-up exam, 3-6 months since previous exam - POCT CBC  7. BV (bacterial vaginosis) - metroNIDAZOLE (FLAGYL) 500 MG tablet; Take 1 tablet (500 mg total) by mouth 2 (two) times daily.  Dispense: 14 tablet; Refill: 0  8. Need for prophylactic vaccination and inoculation against influenza  9. Vaginitis and vulvovaginitis - fluconazole (DIFLUCAN) 150 MG tablet; Take 1 tablet (150 mg total) by mouth once.  Dispense: 1 tablet; Refill: 0 Continue all current medications Labs pending, cmp, lipid panel F/u in 3 months Discussed exercise and diet  Patient verbalized understanding Coralie Keens, FNP-C

## 2012-11-17 LAB — NMR, LIPOPROFILE
Cholesterol: 126 mg/dL (ref <200)
HDL Cholesterol by NMR: 49 mg/dL (ref >=40)
HDL Particle Number: 37.6 umol/L (ref >=30.5)
LDL Particle Number: 1197 nmol/L — ABNORMAL HIGH (ref <1000)
LDL Size: 20 nm — ABNORMAL LOW (ref >20.5)
LDLC SERPL CALC-MCNC: 48 mg/dL (ref <100)
LP-IR Score: 55 — ABNORMAL HIGH (ref <=45)
Small LDL Particle Number: 784 nmol/L — ABNORMAL HIGH (ref <=527)
Triglycerides by NMR: 144 mg/dL (ref <150)

## 2012-11-17 LAB — CMP14+EGFR
ALT: 27 IU/L (ref 0–32)
AST: 30 IU/L (ref 0–40)
Albumin/Globulin Ratio: 1.6 (ref 1.1–2.5)
Albumin: 4.5 g/dL (ref 3.5–4.8)
Alkaline Phosphatase: 58 IU/L (ref 39–117)
BUN/Creatinine Ratio: 14 (ref 11–26)
BUN: 12 mg/dL (ref 8–27)
CO2: 22 mmol/L (ref 18–29)
Calcium: 10.5 mg/dL — ABNORMAL HIGH (ref 8.6–10.2)
Chloride: 100 mmol/L (ref 97–108)
Creatinine, Ser: 0.88 mg/dL (ref 0.57–1.00)
GFR calc Af Amer: 76 mL/min/{1.73_m2} (ref >59)
GFR calc non Af Amer: 66 mL/min/{1.73_m2} (ref >59)
Globulin, Total: 2.8 g/dL (ref 1.5–4.5)
Glucose: 93 mg/dL (ref 65–99)
Potassium: 4.2 mmol/L (ref 3.5–5.2)
Sodium: 141 mmol/L (ref 134–144)
Total Bilirubin: 1 mg/dL (ref 0.0–1.2)
Total Protein: 7.3 g/dL (ref 6.0–8.5)

## 2012-11-17 LAB — THYROID PANEL WITH TSH
Free Thyroxine Index: 2.4 (ref 1.2–4.9)
T3 Uptake Ratio: 28 % (ref 24–39)
T4, Total: 8.6 ug/dL (ref 4.5–12.0)
TSH: 1.99 u[IU]/mL (ref 0.450–4.500)

## 2012-11-28 ENCOUNTER — Other Ambulatory Visit: Payer: Self-pay | Admitting: General Practice

## 2012-12-05 ENCOUNTER — Telehealth: Payer: Self-pay | Admitting: Pharmacist

## 2012-12-05 NOTE — Telephone Encounter (Signed)
Patient called to make appt for Prolia injection.  Appt made for 12/14/12 at 3pm.  Patient aware.  Insuance verification performed - zero copay and no PA required.

## 2012-12-14 ENCOUNTER — Ambulatory Visit: Payer: Self-pay

## 2012-12-14 ENCOUNTER — Other Ambulatory Visit (INDEPENDENT_AMBULATORY_CARE_PROVIDER_SITE_OTHER): Payer: Medicare Other | Admitting: Pharmacist

## 2012-12-14 ENCOUNTER — Ambulatory Visit (INDEPENDENT_AMBULATORY_CARE_PROVIDER_SITE_OTHER): Payer: Medicare Other | Admitting: General Practice

## 2012-12-14 ENCOUNTER — Encounter: Payer: Self-pay | Admitting: General Practice

## 2012-12-14 VITALS — BP 158/74 | HR 78 | Temp 96.8°F | Ht 60.0 in | Wt 188.5 lb

## 2012-12-14 DIAGNOSIS — M1990 Unspecified osteoarthritis, unspecified site: Secondary | ICD-10-CM

## 2012-12-14 DIAGNOSIS — G8929 Other chronic pain: Secondary | ICD-10-CM

## 2012-12-14 DIAGNOSIS — M129 Arthropathy, unspecified: Secondary | ICD-10-CM

## 2012-12-14 DIAGNOSIS — M810 Age-related osteoporosis without current pathological fracture: Secondary | ICD-10-CM

## 2012-12-14 DIAGNOSIS — M549 Dorsalgia, unspecified: Secondary | ICD-10-CM

## 2012-12-14 MED ORDER — METHYLPREDNISOLONE ACETATE 80 MG/ML IJ SUSP
80.0000 mg | Freq: Once | INTRAMUSCULAR | Status: AC
  Administered 2012-12-14: 80 mg via INTRAMUSCULAR

## 2012-12-14 MED ORDER — DENOSUMAB 60 MG/ML ~~LOC~~ SOLN
60.0000 mg | Freq: Once | SUBCUTANEOUS | Status: AC
  Administered 2012-12-14: 60 mg via SUBCUTANEOUS

## 2012-12-14 NOTE — Patient Instructions (Signed)
Back Pain, Adult  Low back pain is very common. About 1 in 5 people have back pain. The cause of low back pain is rarely dangerous. The pain often gets better over time. About half of people with a sudden onset of back pain feel better in just 2 weeks. About 8 in 10 people feel better by 6 weeks.   CAUSES  Some common causes of back pain include:  · Strain of the muscles or ligaments supporting the spine.  · Wear and tear (degeneration) of the spinal discs.  · Arthritis.  · Direct injury to the back.  DIAGNOSIS  Most of the time, the direct cause of low back pain is not known. However, back pain can be treated effectively even when the exact cause of the pain is unknown. Answering your caregiver's questions about your overall health and symptoms is one of the most accurate ways to make sure the cause of your pain is not dangerous. If your caregiver needs more information, he or she may order lab work or imaging tests (X-rays or MRIs). However, even if imaging tests show changes in your back, this usually does not require surgery.  HOME CARE INSTRUCTIONS  For many people, back pain returns. Since low back pain is rarely dangerous, it is often a condition that people can learn to manage on their own.   · Remain active. It is stressful on the back to sit or stand in one place. Do not sit, drive, or stand in one place for more than 30 minutes at a time. Take short walks on level surfaces as soon as pain allows. Try to increase the length of time you walk each day.  · Do not stay in bed. Resting more than 1 or 2 days can delay your recovery.  · Do not avoid exercise or work. Your body is made to move. It is not dangerous to be active, even though your back may hurt. Your back will likely heal faster if you return to being active before your pain is gone.  · Pay attention to your body when you  bend and lift. Many people have less discomfort when lifting if they bend their knees, keep the load close to their bodies, and  avoid twisting. Often, the most comfortable positions are those that put less stress on your recovering back.  · Find a comfortable position to sleep. Use a firm mattress and lie on your side with your knees slightly bent. If you lie on your back, put a pillow under your knees.  · Only take over-the-counter or prescription medicines as directed by your caregiver. Over-the-counter medicines to reduce pain and inflammation are often the most helpful. Your caregiver may prescribe muscle relaxant drugs. These medicines help dull your pain so you can more quickly return to your normal activities and healthy exercise.  · Put ice on the injured area.  · Put ice in a plastic bag.  · Place a towel between your skin and the bag.  · Leave the ice on for 15-20 minutes, 3-4 times a day for the first 2 to 3 days. After that, ice and heat may be alternated to reduce pain and spasms.  · Ask your caregiver about trying back exercises and gentle massage. This may be of some benefit.  · Avoid feeling anxious or stressed. Stress increases muscle tension and can worsen back pain. It is important to recognize when you are anxious or stressed and learn ways to manage it. Exercise is a great option.  SEEK MEDICAL CARE IF:  · You have pain that is not relieved with rest or   medicine.  · You have pain that does not improve in 1 week.  · You have new symptoms.  · You are generally not feeling well.  SEEK IMMEDIATE MEDICAL CARE IF:   · You have pain that radiates from your back into your legs.  · You develop new bowel or bladder control problems.  · You have unusual weakness or numbness in your arms or legs.  · You develop nausea or vomiting.  · You develop abdominal pain.  · You feel faint.  Document Released: 02/09/2005 Document Revised: 08/11/2011 Document Reviewed: 06/30/2010  Exit[employer]® Patient Information ©2014 [employer].

## 2012-12-15 ENCOUNTER — Other Ambulatory Visit: Payer: Self-pay | Admitting: General Practice

## 2012-12-15 ENCOUNTER — Telehealth: Payer: Self-pay | Admitting: General Practice

## 2012-12-15 ENCOUNTER — Other Ambulatory Visit: Payer: Self-pay | Admitting: Nurse Practitioner

## 2012-12-15 DIAGNOSIS — G8929 Other chronic pain: Secondary | ICD-10-CM

## 2012-12-15 MED ORDER — TRAMADOL HCL 50 MG PO TABS: 50.0000 mg | ORAL_TABLET | Freq: Two times a day (BID) | ORAL | Status: DC | PRN

## 2012-12-15 NOTE — Progress Notes (Signed)
  Subjective:    Patient ID: Sheila [name], female    DOB: 02-03-1941, 72 y.o.   MRN: [id number]  HPI Patient presents today with complaints of chronic back pain worsening. She reports taking ultram with no relief. Reports she has taking OTC nsaids without relief.     Review of Systems  Constitutional: Negative for fever and chills.  Respiratory: Negative for chest tightness and shortness of breath.   Cardiovascular: Negative for chest pain and palpitations.  Musculoskeletal: Positive for gait problem.       Chronic back pain  Neurological: Negative for dizziness, weakness and headaches.       Objective:   Physical Exam  Constitutional: She is oriented to person, place, and time. She appears well-developed and well-nourished.  Cardiovascular: Normal rate, regular rhythm and normal heart sounds.   Pulmonary/Chest: Effort normal and breath sounds normal. No respiratory distress. She exhibits no tenderness.  Musculoskeletal: She exhibits no edema and no tenderness.  Neurological: She is alert and oriented to person, place, and time.  Skin: Skin is warm and dry.  Psychiatric: She has a normal mood and affect.          Assessment & Plan:  1. Arthritis and 2. Chronic back pain  - methylPREDNISolone acetate (DEPO-MEDROL) injection 80 mg; Inject 1 mL (80 mg total) into the muscle once. - Ambulatory referral to Pain Clinic -RTO if symptoms worsen -Patient verbalized understanding Coralie Keens, FNP-C

## 2012-12-15 NOTE — Telephone Encounter (Signed)
Seen in office on 12-15-2012.

## 2012-12-15 NOTE — Telephone Encounter (Signed)
[name] took care of this

## 2012-12-21 ENCOUNTER — Other Ambulatory Visit: Payer: Self-pay | Admitting: *Deleted

## 2012-12-21 NOTE — Telephone Encounter (Signed)
EPIC HAD ONE DAILY BUT FAX FROM [facility] HAS 2 QD. PLEASE REVIEW. LAST OV 10/14. THANKS.

## 2012-12-22 MED ORDER — FUROSEMIDE 20 MG PO TABS: ORAL_TABLET | ORAL | Status: DC

## 2012-12-28 ENCOUNTER — Telehealth: Payer: Self-pay | Admitting: General Practice

## 2012-12-29 ENCOUNTER — Encounter: Payer: Self-pay | Admitting: Physical Medicine & Rehabilitation

## 2013-01-06 ENCOUNTER — Other Ambulatory Visit: Payer: Self-pay | Admitting: General Practice

## 2013-01-16 ENCOUNTER — Other Ambulatory Visit: Payer: Self-pay | Admitting: General Practice

## 2013-01-17 ENCOUNTER — Other Ambulatory Visit: Payer: Self-pay

## 2013-01-17 ENCOUNTER — Other Ambulatory Visit: Payer: Self-pay | Admitting: General Practice

## 2013-01-17 DIAGNOSIS — F411 Generalized anxiety disorder: Secondary | ICD-10-CM

## 2013-01-17 DIAGNOSIS — G8929 Other chronic pain: Secondary | ICD-10-CM

## 2013-01-17 MED ORDER — ALPRAZOLAM 0.5 MG PO TABS: 0.5000 mg | ORAL_TABLET | Freq: Two times a day (BID) | ORAL | Status: DC | PRN

## 2013-01-17 MED ORDER — TRAMADOL HCL 50 MG PO TABS: 50.0000 mg | ORAL_TABLET | Freq: Two times a day (BID) | ORAL | Status: DC | PRN

## 2013-01-17 NOTE — Telephone Encounter (Signed)
Patient has appointment with pain clinic, Dr. Riley Kill on 02/01/13

## 2013-01-17 NOTE — Telephone Encounter (Signed)
Last seen 12/14/12  [name]  If approved print and route to nurse

## 2013-01-17 NOTE — Telephone Encounter (Signed)
Please call into pharmacy for patient.

## 2013-01-17 NOTE — Telephone Encounter (Signed)
Please call into pharmacy

## 2013-01-17 NOTE — Telephone Encounter (Signed)
Last filled /, last seen /. Route to pool, call into [facility]

## 2013-01-18 ENCOUNTER — Ambulatory Visit (INDEPENDENT_AMBULATORY_CARE_PROVIDER_SITE_OTHER): Payer: Medicare Other | Admitting: General Practice

## 2013-01-18 ENCOUNTER — Encounter: Payer: Self-pay | Admitting: General Practice

## 2013-01-18 VITALS — BP 129/80 | HR 80 | Temp 97.0°F | Ht 60.0 in | Wt 186.5 lb

## 2013-01-18 DIAGNOSIS — R34 Anuria and oliguria: Secondary | ICD-10-CM

## 2013-01-18 DIAGNOSIS — R109 Unspecified abdominal pain: Secondary | ICD-10-CM

## 2013-01-18 DIAGNOSIS — R1030 Lower abdominal pain, unspecified: Secondary | ICD-10-CM

## 2013-01-18 DIAGNOSIS — N390 Urinary tract infection, site not specified: Secondary | ICD-10-CM

## 2013-01-18 LAB — POCT UA - MICROSCOPIC ONLY
Bacteria, U Microscopic: NEGATIVE
Casts, Ur, LPF, POC: NEGATIVE
Crystals, Ur, HPF, POC: NEGATIVE
Mucus, UA: NEGATIVE
RBC, urine, microscopic: NEGATIVE
WBC, Ur, HPF, POC: NEGATIVE

## 2013-01-18 LAB — POCT CBC
Granulocyte percent: 59.5 %G (ref 37–80)
HCT, POC: 41.2 % (ref 37.7–47.9)
Hemoglobin: 13.4 g/dL (ref 12.2–16.2)
Lymph, poc: 4.4 — AB (ref 0.6–3.4)
MCH, POC: 27.5 pg (ref 27–31.2)
MCHC: 32.6 g/dL (ref 31.8–35.4)
MCV: 84.2 fL (ref 80–97)
MPV: 7.6 fL (ref 0–99.8)
POC Granulocyte: 7.8 — AB (ref 2–6.9)
POC LYMPH PERCENT: 33.5 %L (ref 10–50)
Platelet Count, POC: 355 10*3/uL (ref 142–424)
RBC: 4.9 M/uL (ref 4.04–5.48)
RDW, POC: 13.5 %
WBC: 13.1 10*3/uL — AB (ref 4.6–10.2)

## 2013-01-18 LAB — POCT URINALYSIS DIPSTICK
Bilirubin, UA: NEGATIVE
Glucose, UA: NEGATIVE
Leukocytes, UA: NEGATIVE
Nitrite, UA: NEGATIVE
Protein, UA: NEGATIVE
Spec Grav, UA: 1.01
Urobilinogen, UA: NEGATIVE
pH, UA: 7

## 2013-01-18 MED ORDER — CIPROFLOXACIN HCL 500 MG PO TABS: 500.0000 mg | ORAL_TABLET | Freq: Two times a day (BID) | ORAL | Status: DC

## 2013-01-18 NOTE — Progress Notes (Signed)
Subjective:    Patient ID: Sheila [name], female    DOB: [birth year], 72 y.o.   MRN: [id number]  Urinary Frequency  This is a new problem. The current episode started in the past 7 days. The problem occurs intermittently. The problem has been unchanged. There has been no fever. She is not sexually active. There is no history of pyelonephritis. Associated symptoms include frequency, nausea and urgency. Pertinent negatives include no chills, flank pain, hematuria or vomiting. She has tried nothing for the symptoms. There is no history of catheterization, kidney stones or a single kidney.      Review of Systems  Constitutional: Negative for fever and chills.  Respiratory: Negative for chest tightness and shortness of breath.   Cardiovascular: Negative for chest pain and palpitations.  Gastrointestinal: Positive for nausea and abdominal pain. Negative for vomiting, diarrhea, constipation and blood in stool.  Genitourinary: Positive for urgency and frequency. Negative for hematuria, flank pain and difficulty urinating.  Neurological: Negative for dizziness, weakness and headaches.       Objective:   Physical Exam  Constitutional: She is oriented to person, place, and time. She appears well-developed and well-nourished.  Cardiovascular: Normal rate, regular rhythm and normal heart sounds.   Pulmonary/Chest: Effort normal and breath sounds normal.  Abdominal: Soft. Bowel sounds are normal. She exhibits no distension. There is tenderness in the suprapubic area. There is no CVA tenderness.  Neurological: She is alert and oriented to person, place, and time.  Skin: Skin is warm and dry.  Psychiatric: She has a normal mood and affect.      Results for orders placed in visit on 01/18/13  POCT URINALYSIS DIPSTICK      Result Value Range   Color, UA yellow     Clarity, UA cloudy     Glucose, UA neg     Bilirubin, UA neg     Ketones, UA eng     Spec Grav, UA 1.010     Blood, UA tarce     pH, UA 7.0     Protein, UA neg     Urobilinogen, UA negative     Nitrite, UA neg     Leukocytes, UA Negative    POCT UA - MICROSCOPIC ONLY      Result Value Range   WBC, Ur, HPF, POC neg     RBC, urine, microscopic neg     Bacteria, U Microscopic neg     Mucus, UA neg     Epithelial cells, urine per micros occ     Crystals, Ur, HPF, POC neg     Casts, Ur, LPF, POC neg     Amorphous, UA mod    POCT CBC      Result Value Range   WBC 13.1 (*) 4.6 - 10.2 K/uL   Lymph, poc 4.4 (*) 0.6 - 3.4   POC LYMPH PERCENT 33.5  10 - 50 %L   POC Granulocyte 7.8 (*) 2 - 6.9   Granulocyte percent 59.5  37 - 80 %G   RBC 4.9  4.04 - 5.48 M/uL   Hemoglobin 13.4  12.2 - 16.2 g/dL   HCT, POC 46.9  62.9 - 47.9 %   MCV 84.2  80 - 97 fL   MCH, POC 27.5  27 - 31.2 pg   MCHC 32.6  31.8 - 35.4 g/dL   RDW, POC 52.8     Platelet Count, POC 355.0  142 - 424 K/uL   MPV 7.6  0 - 99.8 fL        Assessment & Plan:  1. Urine output low  - POCT urinalysis dipstick - POCT UA - Microscopic Only - CMP14+EGFR  2. Suprapubic pressure, unspecified laterality  - Ambulatory referral to Urology - CMP14+EGFR - POCT CBC  3. Recurrent UTI  - Ambulatory referral to Urology  4. UTI (urinary tract infection)  - ciprofloxacin (CIPRO) 500 MG tablet; Take 1 tablet (500 mg total) by mouth 2 (two) times daily.  Dispense: 20 tablet; Refill: 0 -Increase fluid intake AZO over the counter X2 days Frequent voiding Proper perineal hygiene RTO prn Patient verbalized understanding Coralie Keens, FNP-C

## 2013-01-18 NOTE — Patient Instructions (Signed)

## 2013-01-18 NOTE — Telephone Encounter (Signed)
Done

## 2013-01-19 LAB — CMP14+EGFR
ALT: 21 IU/L (ref 0–32)
AST: 20 IU/L (ref 0–40)
Albumin/Globulin Ratio: 1.7 (ref 1.1–2.5)
Albumin: 4.5 g/dL (ref 3.5–4.8)
Alkaline Phosphatase: 58 IU/L (ref 39–117)
BUN/Creatinine Ratio: 18 (ref 11–26)
BUN: 14 mg/dL (ref 8–27)
CO2: 21 mmol/L (ref 18–29)
Calcium: 10.3 mg/dL — ABNORMAL HIGH (ref 8.6–10.2)
Chloride: 103 mmol/L (ref 97–108)
Creatinine, Ser: 0.76 mg/dL (ref 0.57–1.00)
GFR calc Af Amer: 91 mL/min/{1.73_m2} (ref >59)
GFR calc non Af Amer: 79 mL/min/{1.73_m2} (ref >59)
Globulin, Total: 2.7 g/dL (ref 1.5–4.5)
Glucose: 94 mg/dL (ref 65–99)
Potassium: 3.6 mmol/L (ref 3.5–5.2)
Sodium: 141 mmol/L (ref 134–144)
Total Bilirubin: 0.5 mg/dL (ref 0.0–1.2)
Total Protein: 7.2 g/dL (ref 6.0–8.5)

## 2013-01-30 ENCOUNTER — Other Ambulatory Visit: Payer: Self-pay | Admitting: General Practice

## 2013-02-01 ENCOUNTER — Encounter: Payer: Medicare Other | Admitting: Physical Medicine & Rehabilitation

## 2013-02-14 ENCOUNTER — Other Ambulatory Visit: Payer: Self-pay | Admitting: General Practice

## 2013-02-14 ENCOUNTER — Other Ambulatory Visit: Payer: Self-pay | Admitting: Nurse Practitioner

## 2013-02-20 ENCOUNTER — Other Ambulatory Visit: Payer: Self-pay | Admitting: General Practice

## 2013-02-20 DIAGNOSIS — F411 Generalized anxiety disorder: Secondary | ICD-10-CM

## 2013-02-20 DIAGNOSIS — G8929 Other chronic pain: Secondary | ICD-10-CM

## 2013-02-20 MED ORDER — ALPRAZOLAM 0.5 MG PO TABS: 0.5000 mg | ORAL_TABLET | Freq: Two times a day (BID) | ORAL | Status: DC | PRN

## 2013-02-20 MED ORDER — TRAMADOL HCL 50 MG PO TABS: 50.0000 mg | ORAL_TABLET | Freq: Two times a day (BID) | ORAL | Status: DC | PRN

## 2013-02-20 NOTE — Telephone Encounter (Signed)
Last seen 01/18/13  [name]  If approved print and route to nurse 

## 2013-02-20 NOTE — Telephone Encounter (Signed)
Script ready for pick up 

## 2013-02-20 NOTE — Telephone Encounter (Signed)
Last seen 12/15/12  Sheila [name]  If approved route to nurse to phone into [facility]

## 2013-02-21 NOTE — Telephone Encounter (Signed)
This was taken care of 02/20/13

## 2013-02-24 ENCOUNTER — Other Ambulatory Visit: Payer: Self-pay

## 2013-02-24 MED ORDER — POTASSIUM CHLORIDE CRYS ER 20 MEQ PO TBCR: 20.0000 meq | EXTENDED_RELEASE_TABLET | Freq: Every day | ORAL | Status: DC

## 2013-03-15 ENCOUNTER — Other Ambulatory Visit: Payer: Self-pay | Admitting: General Practice

## 2013-03-21 ENCOUNTER — Other Ambulatory Visit: Payer: Self-pay | Admitting: General Practice

## 2013-03-21 DIAGNOSIS — F411 Generalized anxiety disorder: Secondary | ICD-10-CM

## 2013-03-23 NOTE — Telephone Encounter (Signed)
Patient last seen in office on 01-18-13. Rx last filled on 02-21-13. Please advise. If approved please route to Pool B so nurse can phone in to pharmacy

## 2013-03-23 NOTE — Telephone Encounter (Signed)
Please phone in

## 2013-03-24 NOTE — Telephone Encounter (Signed)
Called into [facility] 

## 2013-04-11 ENCOUNTER — Other Ambulatory Visit: Payer: Self-pay | Admitting: General Practice

## 2013-04-13 NOTE — Telephone Encounter (Signed)
Last seen 01/18/13  [name]  If approved print and route to nurse

## 2013-04-17 ENCOUNTER — Other Ambulatory Visit: Payer: Self-pay | Admitting: Family Medicine

## 2013-04-17 ENCOUNTER — Telehealth: Payer: Self-pay | Admitting: General Practice

## 2013-04-17 ENCOUNTER — Other Ambulatory Visit: Payer: Self-pay | Admitting: Nurse Practitioner

## 2013-04-18 ENCOUNTER — Other Ambulatory Visit: Payer: Self-pay | Admitting: General Practice

## 2013-04-18 DIAGNOSIS — M549 Dorsalgia, unspecified: Secondary | ICD-10-CM

## 2013-04-18 MED ORDER — TRAMADOL HCL 50 MG PO TABS: 50.0000 mg | ORAL_TABLET | Freq: Two times a day (BID) | ORAL | Status: DC | PRN

## 2013-04-18 NOTE — Telephone Encounter (Signed)
Please inform script ready for pick up

## 2013-04-18 NOTE — Telephone Encounter (Signed)
Patient aware.

## 2013-04-19 ENCOUNTER — Telehealth: Payer: Self-pay | Admitting: General Practice

## 2013-04-19 NOTE — Telephone Encounter (Signed)
Aware. 

## 2013-04-19 NOTE — Telephone Encounter (Signed)
Patient needs to pick script up from office.

## 2013-05-05 ENCOUNTER — Ambulatory Visit (INDEPENDENT_AMBULATORY_CARE_PROVIDER_SITE_OTHER): Payer: Medicare Other | Admitting: General Practice

## 2013-05-05 ENCOUNTER — Encounter: Payer: Self-pay | Admitting: General Practice

## 2013-05-05 VITALS — BP 131/70 | HR 71 | Temp 97.5°F | Ht 60.0 in | Wt 194.0 lb

## 2013-05-05 DIAGNOSIS — E039 Hypothyroidism, unspecified: Secondary | ICD-10-CM

## 2013-05-05 DIAGNOSIS — E785 Hyperlipidemia, unspecified: Secondary | ICD-10-CM

## 2013-05-05 DIAGNOSIS — F411 Generalized anxiety disorder: Secondary | ICD-10-CM

## 2013-05-05 DIAGNOSIS — I10 Essential (primary) hypertension: Secondary | ICD-10-CM

## 2013-05-05 MED ORDER — ALPRAZOLAM 0.5 MG PO TABS: 0.5000 mg | ORAL_TABLET | Freq: Two times a day (BID) | ORAL | Status: DC | PRN

## 2013-05-05 MED ORDER — LEVOTHYROXINE SODIUM 75 MCG PO TABS: 75.0000 ug | ORAL_TABLET | Freq: Every day | ORAL | Status: DC

## 2013-05-05 NOTE — Progress Notes (Signed)
   Subjective:    Patient ID: Sheila [name], female    DOB: 1940/06/28, 73 y.o.   MRN: [id number]  HPI Patient presents today for chronic health follow up. History of anxiety, hyperlipidemia, gerd, hypothyroidism, chronic back pain and hypertension. Taking medications as directed. Anxiety well controlled with medications. Denies regular exercise, but eating healthier.     Review of Systems  Constitutional: Negative for fever and chills.  Respiratory: Negative for chest tightness and shortness of breath.   Cardiovascular: Negative for chest pain and palpitations.  Neurological: Negative for dizziness, weakness and headaches.  Psychiatric/Behavioral: Negative for suicidal ideas and self-injury. The patient is nervous/anxious.        Objective:   Physical Exam  Constitutional: She is oriented to person, place, and time. She appears well-developed and well-nourished.  HENT:  Head: Normocephalic and atraumatic.  Right Ear: External ear normal.  Left Ear: External ear normal.  Cardiovascular: Normal rate, regular rhythm and normal heart sounds.   Pulmonary/Chest: Effort normal and breath sounds normal. No respiratory distress. She exhibits no tenderness.  Abdominal: Soft. Bowel sounds are normal. She exhibits no distension. There is no tenderness.  Neurological: She is alert and oriented to person, place, and time.  Skin: Skin is warm and dry.  Psychiatric: She has a normal mood and affect.          Assessment & Plan:  1. HLD (hyperlipidemia)  - Lipid panel  2. HTN (hypertension)  - CMP14+EGFR  3. Hypothyroidism  - Thyroid Panel With TSH - levothyroxine (SYNTHROID, LEVOTHROID) 75 MCG tablet; Take 1 tablet (75 mcg total) by mouth daily before breakfast.  Dispense: 30 tablet; Refill: 5  4. Generalized anxiety disorder  - ALPRAZolam (XANAX) 0.5 MG tablet; Take 1 tablet (0.5 mg total) by mouth 2 (two) times daily as needed for anxiety.  Dispense: 60 tablet; Refill:  2 -Continue all current medications Labs pending F/u in 3 months Discussed benefits of regular exercise and healthy eating Patient verbalized understanding Erby Pian, FNP-C

## 2013-05-05 NOTE — Patient Instructions (Signed)

## 2013-05-06 LAB — CMP14+EGFR
ALT: 22 IU/L (ref 0–32)
AST: 25 IU/L (ref 0–40)
Albumin/Globulin Ratio: 1.7 (ref 1.1–2.5)
Albumin: 4.4 g/dL (ref 3.5–4.8)
Alkaline Phosphatase: 53 IU/L (ref 39–117)
BUN/Creatinine Ratio: 19 (ref 11–26)
BUN: 16 mg/dL (ref 8–27)
CO2: 23 mmol/L (ref 18–29)
Calcium: 10.7 mg/dL — ABNORMAL HIGH (ref 8.7–10.3)
Chloride: 100 mmol/L (ref 97–108)
Creatinine, Ser: 0.85 mg/dL (ref 0.57–1.00)
GFR calc Af Amer: 80 mL/min/{1.73_m2} (ref >59)
GFR calc non Af Amer: 69 mL/min/{1.73_m2} (ref >59)
Globulin, Total: 2.6 g/dL (ref 1.5–4.5)
Glucose: 101 mg/dL — ABNORMAL HIGH (ref 65–99)
Potassium: 4.1 mmol/L (ref 3.5–5.2)
Sodium: 141 mmol/L (ref 134–144)
Total Bilirubin: 0.8 mg/dL (ref 0.0–1.2)
Total Protein: 7 g/dL (ref 6.0–8.5)

## 2013-05-06 LAB — THYROID PANEL WITH TSH
Free Thyroxine Index: 3 (ref 1.2–4.9)
T3 Uptake Ratio: 30 % (ref 24–39)
T4, Total: 9.9 ug/dL (ref 4.5–12.0)
TSH: 0.865 u[IU]/mL (ref 0.450–4.500)

## 2013-05-06 LAB — LIPID PANEL
Chol/HDL Ratio: 3.1 ratio units (ref 0.0–4.4)
Cholesterol, Total: 144 mg/dL (ref 100–199)
HDL: 47 mg/dL (ref >39)
LDL Calculated: 62 mg/dL (ref 0–99)
Triglycerides: 176 mg/dL — ABNORMAL HIGH (ref 0–149)
VLDL Cholesterol Cal: 35 mg/dL (ref 5–40)

## 2013-05-12 ENCOUNTER — Other Ambulatory Visit: Payer: Self-pay | Admitting: Nurse Practitioner

## 2013-05-12 ENCOUNTER — Other Ambulatory Visit: Payer: Self-pay | Admitting: General Practice

## 2013-05-13 ENCOUNTER — Other Ambulatory Visit: Payer: Self-pay | Admitting: General Practice

## 2013-05-15 ENCOUNTER — Telehealth: Payer: Self-pay | Admitting: General Practice

## 2013-05-15 ENCOUNTER — Other Ambulatory Visit: Payer: Self-pay | Admitting: Family Medicine

## 2013-05-15 DIAGNOSIS — R062 Wheezing: Secondary | ICD-10-CM

## 2013-05-15 DIAGNOSIS — F411 Generalized anxiety disorder: Secondary | ICD-10-CM

## 2013-05-15 DIAGNOSIS — E039 Hypothyroidism, unspecified: Secondary | ICD-10-CM

## 2013-05-15 DIAGNOSIS — J309 Allergic rhinitis, unspecified: Secondary | ICD-10-CM

## 2013-05-15 DIAGNOSIS — R059 Cough, unspecified: Secondary | ICD-10-CM

## 2013-05-15 DIAGNOSIS — M549 Dorsalgia, unspecified: Secondary | ICD-10-CM

## 2013-05-15 DIAGNOSIS — R05 Cough: Secondary | ICD-10-CM

## 2013-05-15 NOTE — Telephone Encounter (Signed)
Sent in refill encounter

## 2013-05-29 ENCOUNTER — Other Ambulatory Visit: Payer: Self-pay | Admitting: Pharmacist

## 2013-05-29 ENCOUNTER — Other Ambulatory Visit: Payer: Self-pay | Admitting: General Practice

## 2013-05-29 ENCOUNTER — Telehealth: Payer: Self-pay | Admitting: *Deleted

## 2013-05-29 DIAGNOSIS — M549 Dorsalgia, unspecified: Secondary | ICD-10-CM

## 2013-05-29 DIAGNOSIS — R062 Wheezing: Secondary | ICD-10-CM

## 2013-05-29 MED ORDER — ATORVASTATIN CALCIUM 20 MG PO TABS: ORAL_TABLET | ORAL | Status: DC

## 2013-05-29 MED ORDER — ALBUTEROL SULFATE HFA 108 (90 BASE) MCG/ACT IN AERS: 2.0000 | INHALATION_SPRAY | Freq: Four times a day (QID) | RESPIRATORY_TRACT | Status: DC | PRN

## 2013-05-29 MED ORDER — CITALOPRAM HYDROBROMIDE 20 MG PO TABS: ORAL_TABLET | ORAL | Status: DC

## 2013-05-29 MED ORDER — FUROSEMIDE 20 MG PO TABS: ORAL_TABLET | ORAL | Status: DC

## 2013-05-29 MED ORDER — OMEPRAZOLE 40 MG PO CPDR: DELAYED_RELEASE_CAPSULE | ORAL | Status: DC

## 2013-05-29 MED ORDER — POTASSIUM CHLORIDE CRYS ER 20 MEQ PO TBCR: 20.0000 meq | EXTENDED_RELEASE_TABLET | Freq: Every day | ORAL | Status: DC

## 2013-05-29 MED ORDER — TRAMADOL HCL 50 MG PO TABS: 50.0000 mg | ORAL_TABLET | Freq: Two times a day (BID) | ORAL | Status: DC | PRN

## 2013-05-29 MED ORDER — DENOSUMAB 60 MG/ML ~~LOC~~ SOLN: 60.0000 mg | SUBCUTANEOUS | Status: DC

## 2013-05-29 MED ORDER — VERAPAMIL HCL ER 240 MG PO TBCR: EXTENDED_RELEASE_TABLET | ORAL | Status: DC

## 2013-05-29 NOTE — Telephone Encounter (Signed)
Tramadol  script ready. 

## 2013-05-30 ENCOUNTER — Telehealth: Payer: Self-pay | Admitting: General Practice

## 2013-05-30 NOTE — Telephone Encounter (Signed)
Patient doesn't take a calcium supplement by itself but she does take Centrum Silver daily. She also drinks a lot of milk. Serum calcium has been elevated over the past 2 years. Do you want her to d/c multivitamin or make any other changes?

## 2013-05-31 NOTE — Telephone Encounter (Signed)
Spoke with patient's caregiver and discussed labs. Denies taking calcium supplement, but taking multivitamin. Patient takes multivitamin, will discontinue and have labs rechecked in 1 month.

## 2013-06-08 ENCOUNTER — Telehealth: Payer: Self-pay | Admitting: General Practice

## 2013-06-08 ENCOUNTER — Other Ambulatory Visit: Payer: Self-pay | Admitting: Pharmacist

## 2013-06-08 ENCOUNTER — Inpatient Hospital Stay
Admission: RE | Admit: 2013-06-08 | Discharge: 2013-06-08 | Disposition: A | Discharge status: Home / Self Care | Payer: Self-pay | Source: Ambulatory Visit | Attending: Pharmacist | Admitting: Pharmacist

## 2013-06-08 DIAGNOSIS — M810 Age-related osteoporosis without current pathological fracture: Secondary | ICD-10-CM

## 2013-06-08 NOTE — Telephone Encounter (Signed)
Just received PA for Prolia - approved through 06/06/2014 [id number]. Patient notified and [facility] notified.  They will order and deliver - appt for injection and DEXA for 06/14/13

## 2013-06-14 ENCOUNTER — Encounter: Payer: Self-pay | Admitting: Pharmacist

## 2013-06-14 ENCOUNTER — Other Ambulatory Visit: Payer: Self-pay | Admitting: Family Medicine

## 2013-06-14 ENCOUNTER — Ambulatory Visit (INDEPENDENT_AMBULATORY_CARE_PROVIDER_SITE_OTHER): Payer: Medicare Other | Admitting: Pharmacist

## 2013-06-14 ENCOUNTER — Ambulatory Visit (INDEPENDENT_AMBULATORY_CARE_PROVIDER_SITE_OTHER): Payer: Medicare Other

## 2013-06-14 VITALS — Ht 60.0 in | Wt 195.0 lb

## 2013-06-14 DIAGNOSIS — M810 Age-related osteoporosis without current pathological fracture: Secondary | ICD-10-CM

## 2013-06-14 LAB — HM DEXA SCAN

## 2013-06-14 MED ORDER — DENOSUMAB 60 MG/ML ~~LOC~~ SOLN
60.0000 mg | Freq: Once | SUBCUTANEOUS | Status: AC
  Administered 2013-06-14: 60 mg via SUBCUTANEOUS

## 2013-06-14 NOTE — Progress Notes (Signed)
Patient ID: RIM THATCH, female   DOB: Apr 20, 1940, 73 y.o.   MRN: [id number] [facility] Current Height: Height: 5' (152.4 cm)      Max Lifetime Height:  5' 1.5" Current Weight: Weight: 195 lb (88.451 kg)       Ethnicity:Caucasian    HPI: Patient is here today for Prolia injeciton and to have DEXA rechecked. She has a diagnosis of ostoeporosis and has been taking Prolia 60mg  injections every 6 months since 05/2011.  Her last injection was 11/2012. She took alendronate in past due experienced treatment failue / continued decrease in BMD dispite compliance with therapy  Back Pain?  Yes       Kyphosis?  Yes Prior fracture?  No                                                             PMH: Age at menopause:   12's Hysterectomy?  No Oophorectomy?  No HRT? No Steroid Use?  No Thyroid med?  Yes History of cancer?  No History of digestive disorders (ie Crohn's)?  Yes - GERD on daily PPI Current or previous eating disorders?  No Last Vitamin D Result:  31 (07/2012) Last GFR Result:  69 (05/05/2013)   FH/SH: Family history of osteoporosis?  Yes - sister Parent with history of hip fracture?  No Family history of breast cancer?  No Exercise?  No Smoking?  No Alcohol?  No    Calcium Assessment Calcium Intake  # of servings/day  Calcium mg  Milk (8 oz) 2  x  300  = 600mg   Yogurt (4 oz) 0 x  200 = 0  Cheese (1 oz) 0 x  200 = 0  Other Calcium sources   250mg   Ca supplement MVI every other day = 200mg    Estimated calcium intake per day 850mg     DEXA Results Date of Test T-Score for AP Spine L1-L4 T-Score for Total Left Hip T-Score for Total Right Hip  06/14/2013 0.0 -1.8 -1.6  04/15/2011 -0.2 -1.9 -1.7  05/18/2007 0.0 -1.7 -1.4  12/25/2003 -0.8 -1.5 --  T-Score for neck of left hip = -2.6  Assessment: Osteoporosis - tolerating Prolia with improved BMD at left hip and spine Hypercalcemia  Recommendations: 1.  Prolia injection given in office today - left arm,  patient brought in medication (filled at her Pharmacy) lot [id number].  Exp Date:  12/2015 2.  Continue current calcium intake (has history of hypercalcemia so would not recommend supplementation at this time.  Prolia could help to decrease serum calcium)  3.  recommend weight bearing exercise - 30 minutes at least 4 days per week.   4.  Counseled and educated about fall risk and prevention.  Recheck DEXA:  2 years  Time spent counseling patient:  30 minutes  Cherre Robins, PharmD, CPP

## 2013-07-05 ENCOUNTER — Other Ambulatory Visit: Payer: Self-pay | Admitting: General Practice

## 2013-07-05 NOTE — Telephone Encounter (Signed)
Last seen 3/14 last refilled 4/13 please route to pool if approved

## 2013-07-06 NOTE — Telephone Encounter (Signed)
Called in.

## 2013-07-06 NOTE — Telephone Encounter (Signed)
Please call in xanax with 1 refills 

## 2013-07-26 ENCOUNTER — Encounter: Payer: Self-pay | Admitting: *Deleted

## 2013-07-31 ENCOUNTER — Emergency Department (HOSPITAL_COMMUNITY): Payer: PRIVATE HEALTH INSURANCE

## 2013-07-31 ENCOUNTER — Encounter (HOSPITAL_COMMUNITY): Payer: Self-pay | Admitting: Emergency Medicine

## 2013-07-31 ENCOUNTER — Observation Stay (HOSPITAL_COMMUNITY)
Admission: EM | Admit: 2013-07-31 | Discharge: 2013-08-02 | Disposition: A | Discharge status: Home / Self Care | Payer: PRIVATE HEALTH INSURANCE | Attending: General Surgery | Admitting: General Surgery

## 2013-07-31 DIAGNOSIS — M549 Dorsalgia, unspecified: Secondary | ICD-10-CM

## 2013-07-31 DIAGNOSIS — F329 Major depressive disorder, single episode, unspecified: Secondary | ICD-10-CM | POA: Insufficient documentation

## 2013-07-31 DIAGNOSIS — M1710 Unilateral primary osteoarthritis, unspecified knee: Secondary | ICD-10-CM | POA: Diagnosis not present

## 2013-07-31 DIAGNOSIS — K3580 Unspecified acute appendicitis: Secondary | ICD-10-CM | POA: Diagnosis not present

## 2013-07-31 DIAGNOSIS — Z8711 Personal history of peptic ulcer disease: Secondary | ICD-10-CM | POA: Insufficient documentation

## 2013-07-31 DIAGNOSIS — I10 Essential (primary) hypertension: Secondary | ICD-10-CM | POA: Diagnosis not present

## 2013-07-31 DIAGNOSIS — K5730 Diverticulosis of large intestine without perforation or abscess without bleeding: Secondary | ICD-10-CM | POA: Diagnosis not present

## 2013-07-31 DIAGNOSIS — E7800 Pure hypercholesterolemia, unspecified: Secondary | ICD-10-CM | POA: Insufficient documentation

## 2013-07-31 DIAGNOSIS — F3289 Other specified depressive episodes: Secondary | ICD-10-CM | POA: Diagnosis not present

## 2013-07-31 DIAGNOSIS — E039 Hypothyroidism, unspecified: Secondary | ICD-10-CM | POA: Diagnosis not present

## 2013-07-31 DIAGNOSIS — R109 Unspecified abdominal pain: Secondary | ICD-10-CM | POA: Diagnosis present

## 2013-07-31 DIAGNOSIS — R1031 Right lower quadrant pain: Secondary | ICD-10-CM | POA: Diagnosis present

## 2013-07-31 DIAGNOSIS — G510 Bell's palsy: Secondary | ICD-10-CM | POA: Diagnosis not present

## 2013-07-31 LAB — COMPREHENSIVE METABOLIC PANEL
ALT: 15 U/L (ref 0–35)
AST: 20 U/L (ref 0–37)
Albumin: 3.6 g/dL (ref 3.5–5.2)
Alkaline Phosphatase: 49 U/L (ref 39–117)
BUN: 14 mg/dL (ref 6–23)
CO2: 28 mEq/L (ref 19–32)
Calcium: 10.3 mg/dL (ref 8.4–10.5)
Chloride: 99 mEq/L (ref 96–112)
Creatinine, Ser: 0.72 mg/dL (ref 0.50–1.10)
GFR calc Af Amer: >90 mL/min (ref >90)
GFR calc non Af Amer: 84 mL/min — ABNORMAL LOW (ref >90)
Glucose, Bld: 98 mg/dL (ref 70–99)
Potassium: 4.1 mEq/L (ref 3.7–5.3)
Sodium: 140 mEq/L (ref 137–147)
Total Bilirubin: 0.6 mg/dL (ref 0.3–1.2)
Total Protein: 7.2 g/dL (ref 6.0–8.3)

## 2013-07-31 LAB — CBC WITH DIFFERENTIAL/PLATELET
Basophils Absolute: 0 10*3/uL (ref 0.0–0.1)
Basophils Relative: 0 % (ref 0–1)
Eosinophils Absolute: 0.2 10*3/uL (ref 0.0–0.7)
Eosinophils Relative: 2 % (ref 0–5)
HCT: 37.6 % (ref 36.0–46.0)
Hemoglobin: 12.9 g/dL (ref 12.0–15.0)
Lymphocytes Relative: 36 % (ref 12–46)
Lymphs Abs: 3.6 10*3/uL (ref 0.7–4.0)
MCH: 29.4 pg (ref 26.0–34.0)
MCHC: 34.3 g/dL (ref 30.0–36.0)
MCV: 85.6 fL (ref 78.0–100.0)
Monocytes Absolute: 0.8 10*3/uL (ref 0.1–1.0)
Monocytes Relative: 8 % (ref 3–12)
Neutro Abs: 5.5 10*3/uL (ref 1.7–7.7)
Neutrophils Relative %: 54 % (ref 43–77)
Platelets: 280 10*3/uL (ref 150–400)
RBC: 4.39 MIL/uL (ref 3.87–5.11)
RDW: 14.4 % (ref 11.5–15.5)
WBC: 10.2 10*3/uL (ref 4.0–10.5)

## 2013-07-31 LAB — URINALYSIS, ROUTINE W REFLEX MICROSCOPIC
Bilirubin Urine: NEGATIVE
Glucose, UA: NEGATIVE mg/dL
Hgb urine dipstick: NEGATIVE
Ketones, ur: NEGATIVE mg/dL
Nitrite: NEGATIVE
Protein, ur: NEGATIVE mg/dL
Specific Gravity, Urine: <1.005 — ABNORMAL LOW (ref 1.005–1.030)
Urobilinogen, UA: 0.2 mg/dL (ref 0.0–1.0)
pH: 7.5 (ref 5.0–8.0)

## 2013-07-31 LAB — URINE MICROSCOPIC-ADD ON

## 2013-07-31 LAB — LIPASE, BLOOD: Lipase: 32 U/L (ref 11–59)

## 2013-07-31 MED ORDER — CIPROFLOXACIN IN D5W 400 MG/200ML IV SOLN
400.0000 mg | Freq: Two times a day (BID) | INTRAVENOUS | Status: DC
  Administered 2013-07-31 – 2013-08-01 (×2): 400 mg via INTRAVENOUS
  Filled 2013-07-31 (×2): qty 200

## 2013-07-31 MED ORDER — TRAMADOL HCL 50 MG PO TABS: 50.0000 mg | ORAL_TABLET | Freq: Four times a day (QID) | ORAL | Status: DC | PRN

## 2013-07-31 MED ORDER — ALPRAZOLAM 1 MG PO TABS
1.0000 mg | ORAL_TABLET | Freq: Every day | ORAL | Status: DC
  Administered 2013-07-31: 1 mg via ORAL
  Filled 2013-07-31: qty 1

## 2013-07-31 MED ORDER — FLUTICASONE PROPIONATE 50 MCG/ACT NA SUSP
1.0000 | Freq: Every day | NASAL | Status: DC
  Administered 2013-07-31: 1 via NASAL
  Filled 2013-07-31: qty 16

## 2013-07-31 MED ORDER — PANTOPRAZOLE SODIUM 40 MG PO TBEC: 40.0000 mg | DELAYED_RELEASE_TABLET | Freq: Every day | ORAL | Status: DC

## 2013-07-31 MED ORDER — ONDANSETRON HCL 4 MG/2ML IJ SOLN
4.0000 mg | Freq: Four times a day (QID) | INTRAMUSCULAR | Status: DC | PRN
  Administered 2013-08-01: 4 mg via INTRAVENOUS
  Filled 2013-07-31: qty 2

## 2013-07-31 MED ORDER — ACETAMINOPHEN 325 MG PO TABS
650.0000 mg | ORAL_TABLET | Freq: Four times a day (QID) | ORAL | Status: DC | PRN
  Administered 2013-08-01: 650 mg via ORAL
  Filled 2013-07-31: qty 2

## 2013-07-31 MED ORDER — DEXTROSE IN LACTATED RINGERS 5 % IV SOLN
INTRAVENOUS | Status: DC
  Administered 2013-07-31: 18:00:00 via INTRAVENOUS

## 2013-07-31 MED ORDER — LEVOTHYROXINE SODIUM 75 MCG PO TABS
75.0000 ug | ORAL_TABLET | Freq: Every day | ORAL | Status: DC
  Administered 2013-08-01: 75 ug via ORAL
  Filled 2013-07-31: qty 1

## 2013-07-31 MED ORDER — ENOXAPARIN SODIUM 40 MG/0.4ML ~~LOC~~ SOLN
40.0000 mg | SUBCUTANEOUS | Status: DC
  Administered 2013-07-31: 40 mg via SUBCUTANEOUS
  Filled 2013-07-31: qty 0.4

## 2013-07-31 MED ORDER — CITALOPRAM HYDROBROMIDE 20 MG PO TABS
20.0000 mg | ORAL_TABLET | Freq: Every day | ORAL | Status: DC
  Administered 2013-08-01: 20 mg via ORAL
  Filled 2013-07-31: qty 1

## 2013-07-31 MED ORDER — FENTANYL CITRATE 0.05 MG/ML IJ SOLN
25.0000 ug | Freq: Once | INTRAMUSCULAR | Status: AC
  Administered 2013-07-31: 25 ug via INTRAVENOUS
  Filled 2013-07-31: qty 2

## 2013-07-31 MED ORDER — HYDROMORPHONE HCL PF 1 MG/ML IJ SOLN
0.5000 mg | Freq: Once | INTRAMUSCULAR | Status: AC
  Administered 2013-07-31: 0.5 mg via INTRAVENOUS
  Filled 2013-07-31: qty 1

## 2013-07-31 MED ORDER — ONDANSETRON HCL 4 MG/2ML IJ SOLN
4.0000 mg | Freq: Once | INTRAMUSCULAR | Status: DC
  Filled 2013-07-31: qty 2

## 2013-07-31 MED ORDER — FUROSEMIDE 20 MG PO TABS
20.0000 mg | ORAL_TABLET | Freq: Every day | ORAL | Status: DC
  Administered 2013-08-01: 20 mg via ORAL
  Filled 2013-07-31: qty 1

## 2013-07-31 MED ORDER — SODIUM CHLORIDE 0.9 % IV SOLN
1000.0000 mL | INTRAVENOUS | Status: DC
  Administered 2013-07-31: 1000 mL via INTRAVENOUS

## 2013-07-31 MED ORDER — ALBUTEROL SULFATE (2.5 MG/3ML) 0.083% IN NEBU: 3.0000 mL | INHALATION_SOLUTION | Freq: Four times a day (QID) | RESPIRATORY_TRACT | Status: DC | PRN

## 2013-07-31 MED ORDER — METRONIDAZOLE IN NACL 5-0.79 MG/ML-% IV SOLN
500.0000 mg | Freq: Three times a day (TID) | INTRAVENOUS | Status: DC
  Administered 2013-07-31 – 2013-08-01 (×3): 500 mg via INTRAVENOUS
  Filled 2013-07-31 (×3): qty 100

## 2013-07-31 MED ORDER — VERAPAMIL HCL ER 240 MG PO TBCR
240.0000 mg | EXTENDED_RELEASE_TABLET | Freq: Every day | ORAL | Status: DC
  Administered 2013-08-01: 240 mg via ORAL
  Filled 2013-07-31: qty 1

## 2013-07-31 MED ORDER — ACETAMINOPHEN 650 MG RE SUPP: 650.0000 mg | Freq: Four times a day (QID) | RECTAL | Status: DC | PRN

## 2013-07-31 MED ORDER — OLANZAPINE-FLUOXETINE HCL 6-25 MG PO CAPS
1.0000 | ORAL_CAPSULE | ORAL | Status: DC
  Administered 2013-08-01: 1 via ORAL
  Filled 2013-07-31 (×2): qty 1

## 2013-07-31 NOTE — H&P (Signed)
Sheila [name] is an 73 y.o. female.   Chief Complaint: Abdominal pain HPI: Patient is a 73 year old white female who 3 days ago it began experiencing nonspecific epigastric and right-sided abdominal pain. Over the next 24 hours, it did worsen and she stated that no nausea or vomiting have occurred. Earlier in the week, she had a 48 hour history of diarrhea. Since that time, her abdominal pain his knees, but has not resolved. She presented emergency room today and was on CT scan the abdomen possible early distal appendiceal tip inflammation. The rest of her appendix was within normal limits. She states her appetite is decreased, though she is hungry. She denies any fever, chills, or jaundice.  Past Medical History  Diagnosis Date  . Hypertension   . Depression   . Hypercholesteremia   . Diverticulosis of colon   . Hypothyroidism   . Chest pain 07/2009    Myoveiw stress test negative.  [name] DJD (degenerative joint disease) of knee   . Bell's palsy   . UTI (lower urinary tract infection)   . Gastric ulcer with hemorrhage 01/14/2011    Past Surgical History  Procedure Laterality Date  . Knee surgery  04/2010.    Total left knee replacement  . Esophagogastroduodenoscopy  01/14/2011    Procedure: ESOPHAGOGASTRODUODENOSCOPY (EGD);  Surgeon: Rogene Houston, MD;  Location: [facility];  Service: Endoscopy;  Laterality: N/A;  . Joint replacement    . Hip surgery      History reviewed. No pertinent family history. Social History:  reports that she has quit smoking. Her smokeless tobacco use includes Snuff. She reports that she does not drink alcohol or use illicit drugs.  Allergies:  Allergies  Allergen Reactions  . Benadryl [Diphenhydramine Hcl] Other (See Comments)    Worsening depression   . Codeine Nausea And Vomiting  . Diphenhydramine Hcl   . Ibuprofen     History of bleeding ulcers - contra-indicated   . Morphine And Related Nausea And Vomiting     (Not in a hospital  admission)  Results for orders placed during the hospital encounter of 07/31/13 (from the past 48 hour(s))  CBC WITH DIFFERENTIAL     Status: None   Collection Time    07/31/13 12:53 PM      Result Value Ref Range   WBC 10.2  4.0 - 10.5 K/uL   RBC 4.39  3.87 - 5.11 MIL/uL   Hemoglobin 12.9  12.0 - 15.0 g/dL   HCT 37.6  36.0 - 46.0 %   MCV 85.6  78.0 - 100.0 fL   MCH 29.4  26.0 - 34.0 pg   MCHC 34.3  30.0 - 36.0 g/dL   RDW 14.4  11.5 - 15.5 %   Platelets 280  150 - 400 K/uL   Neutrophils Relative % 54  43 - 77 %   Neutro Abs 5.5  1.7 - 7.7 K/uL   Lymphocytes Relative 36  12 - 46 %   Lymphs Abs 3.6  0.7 - 4.0 K/uL   Monocytes Relative 8  3 - 12 %   Monocytes Absolute 0.8  0.1 - 1.0 K/uL   Eosinophils Relative 2  0 - 5 %   Eosinophils Absolute 0.2  0.0 - 0.7 K/uL   Basophils Relative 0  0 - 1 %   Basophils Absolute 0.0  0.0 - 0.1 K/uL  COMPREHENSIVE METABOLIC PANEL     Status: Abnormal   Collection Time    07/31/13 12:53 PM  Result Value Ref Range   Sodium 140  137 - 147 mEq/L   Potassium 4.1  3.7 - 5.3 mEq/L   Chloride 99  96 - 112 mEq/L   CO2 28  19 - 32 mEq/L   Glucose, Bld 98  70 - 99 mg/dL   BUN 14  6 - 23 mg/dL   Creatinine, Ser 0.72  0.50 - 1.10 mg/dL   Calcium 10.3  8.4 - 10.5 mg/dL   Total Protein 7.2  6.0 - 8.3 g/dL   Albumin 3.6  3.5 - 5.2 g/dL   AST 20  0 - 37 U/L   ALT 15  0 - 35 U/L   Alkaline Phosphatase 49  39 - 117 U/L   Total Bilirubin 0.6  0.3 - 1.2 mg/dL   GFR calc non Af Amer 84 (*) >90 mL/min   GFR calc Af Amer >90  >90 mL/min   Comment: (NOTE)     The eGFR has been calculated using the CKD EPI equation.     This calculation has not been validated in all clinical situations.     eGFR's persistently <90 mL/min signify possible Chronic Kidney     Disease.  LIPASE, BLOOD     Status: None   Collection Time    07/31/13 12:53 PM      Result Value Ref Range   Lipase 32  11 - 59 U/L  URINALYSIS, ROUTINE W REFLEX MICROSCOPIC     Status: Abnormal    Collection Time    07/31/13  2:42 PM      Result Value Ref Range   Color, Urine YELLOW  YELLOW   APPearance CLEAR  CLEAR   Specific Gravity, Urine <1.005 (*) 1.005 - 1.030   pH 7.5  5.0 - 8.0   Glucose, UA NEGATIVE  NEGATIVE mg/dL   Hgb urine dipstick NEGATIVE  NEGATIVE   Bilirubin Urine NEGATIVE  NEGATIVE   Ketones, ur NEGATIVE  NEGATIVE mg/dL   Protein, ur NEGATIVE  NEGATIVE mg/dL   Urobilinogen, UA 0.2  0.0 - 1.0 mg/dL   Nitrite NEGATIVE  NEGATIVE   Leukocytes, UA SMALL (*) NEGATIVE  URINE MICROSCOPIC-ADD ON     Status: Abnormal   Collection Time    07/31/13  2:42 PM      Result Value Ref Range   Squamous Epithelial / LPF FEW (*) RARE   WBC, UA 3-6  <3 WBC/hpf   Ct Abdomen Pelvis Wo Contrast  07/31/2013   CLINICAL DATA:  Abdominal pain associated with dysuria and back pain mostly on the right side.  EXAM: CT ABDOMEN AND PELVIS WITHOUT CONTRAST  TECHNIQUE: Multidetector CT imaging of the abdomen and pelvis was performed following the standard protocol without IV contrast.  COMPARISON:  01/02/2011  FINDINGS: The appendix is dilated at its tip, measuring 8 mm, associated with subtle inflammatory changes in the adjacent fat.  There are left colon diverticula. No diverticulitis. Small bowel is unremarkable.  Minor lung base subsegmental atelectasis and/or scarring.  Liver, spleen, gallbladder, pancreas, adrenal glands:  Normal.  Kidneys are normal in size and position. No renal masses. No stones. No hydronephrosis. Normal ureters. Normal bladder.  Uterus is unremarkable.  No adnexal masses.  No pathologically enlarged lymph nodes.  No ascites.  There are degenerative changes throughout the lumbar spine. No osteoblastic or osteolytic lesions.  IMPRESSION: 1. The appendiceal tip is dilated to 8 mm in shows subtle adjacent inflammation. This is consistent with early acute appendicitis in the proper clinical  setting. No extraluminal air or evidence of an abscess. No other evidence of an acute  abnormality. 2. Normal kidneys. No renal or ureteral stones or obstructive uropathy. 3. Left colon diverticula.  No diverticulitis.   Electronically Signed   By: Lajean Manes M.D.   On: 07/31/2013 14:58    Review of Systems  Constitutional: Positive for malaise/fatigue.  HENT: Negative.   Eyes: Negative.   Respiratory: Negative.   Cardiovascular: Negative.   Gastrointestinal: Positive for abdominal pain.  Genitourinary: Negative.   Musculoskeletal: Negative.   Skin: Negative.   All other systems reviewed and are negative.   Blood pressure 147/77, pulse 66, temperature 98.2 F (36.8 C), temperature source Oral, resp. rate 18, height 5' 0.5" (1.537 m), weight 86.183 kg (190 lb), SpO2 99.00%. Physical Exam  Vitals reviewed. Constitutional: She is oriented to person, place, and time. She appears well-developed and well-nourished.  HENT:  Head: Normocephalic and atraumatic.  Neck: Normal range of motion. Neck supple.  Cardiovascular: Normal rate, regular rhythm and normal heart sounds.   Respiratory: Effort normal and breath sounds normal. She has no wheezes.  GI: Soft. Bowel sounds are normal. She exhibits no distension. There is tenderness. There is no rebound and no guarding.  Slightly tender to palpation along the right side of her abdomen. No specific McBurney's point tenderness is noted. No rigidity is noted.  Neurological: She is alert and oriented to person, place, and time.  Skin: Skin is warm and dry.     Assessment/Plan Impression: Abdominal pain, question appendicitis versus resolving appendicitis/enteritis Plan: The fact that the patient is clinically better on day 4 of her abdominal pain history in she had previous episode of diarrhea which lasted 48 hours, she may just have resolving inflammation in this region versus actual appendicitis. She will be admitted to the hospital for observation overnight. She will be started on ciprofloxacin and Flagyl. Should she not get  better, she will be taken to the operating room for laparoscopic appendectomy. The management plan has been explained to the patient and family, who agreed to the treatment plan.  Jamesetta So 07/31/2013, 4:38 PM

## 2013-07-31 NOTE — ED Notes (Signed)
Abd pain since Friday, no n/v "feels tired"

## 2013-07-31 NOTE — ED Provider Notes (Signed)
CSN: [id number]     Arrival date & time 07/31/13  1215 History   First MD Initiated Contact with Patient 07/31/13 1325     Chief Complaint  Patient presents with  . Abdominal Pain   Patient is a 73 y.o. female presenting with abdominal pain. The history is provided by the patient. No language interpreter was used.  Abdominal Pain Associated symptoms: dysuria   Associated symptoms: no vomiting    This chart was scribed for Carmin Muskrat, MD by Thea Alken, ED Scribe. This patient was seen in room [facility] and the patient's care was started at 1:37 PM.  HPI Comments:  Sheila [name] is a 73 y.o. female who present to the Emergency Department complaining of abdominal pain x 3 days ago with associated dysuria and bilateral back pain. Pt reports pain in epigastrium region radiating down belly. At this time pt is not in pain. Pt has taken tylenol for the pain with inconsistent relief. She regularly takes tylenol arthritis and tramadol. Pt denies emesis, nausea, and diarrhea. Otherwise pt states she is healthy.  Past Medical History  Diagnosis Date  . Hypertension   . Depression   . Hypercholesteremia   . Diverticulosis of colon   . Hypothyroidism   . Chest pain 07/2009    Myoveiw stress test negative.  [name] DJD (degenerative joint disease) of knee   . Bell's palsy   . UTI (lower urinary tract infection)   . Gastric ulcer with hemorrhage 01/14/2011   Past Surgical History  Procedure Laterality Date  . Knee surgery  04/2010.    Total left knee replacement  . Esophagogastroduodenoscopy  01/14/2011    Procedure: ESOPHAGOGASTRODUODENOSCOPY (EGD);  Surgeon: Rogene Houston, MD;  Location: [facility];  Service: Endoscopy;  Laterality: N/A;  . Joint replacement    . Hip surgery     History reviewed. No pertinent family history. History  Substance Use Topics  . Smoking status: Former Research scientist (life sciences)  . Smokeless tobacco: Current User    Types: Snuff     Comment: quit yrs and yrs ago when she was  very young  . Alcohol Use: No   OB History   Grav Para Term Preterm Abortions TAB SAB Ect Mult Living                 Review of Systems  Constitutional:       Per HPI, otherwise negative  HENT:       Per HPI, otherwise negative  Respiratory:       Per HPI, otherwise negative  Cardiovascular:       Per HPI, otherwise negative  Gastrointestinal: Positive for abdominal pain. Negative for vomiting.  Endocrine:       Negative aside from HPI  Genitourinary: Positive for dysuria.       Neg aside from HPI   Musculoskeletal:       Per HPI, otherwise negative  Skin: Negative.   Neurological: Negative for syncope.    Allergies  Benadryl; Codeine; Diphenhydramine hcl; Ibuprofen; and Morphine and related  Home Medications   Prior to Admission medications   Medication Sig Start Date End Date Taking? Authorizing Provider  albuterol (PROVENTIL HFA;VENTOLIN HFA) 108 (90 BASE) MCG/ACT inhaler Inhale 2 puffs into the lungs every 6 (six) hours as needed for wheezing. 05/29/13   Erby Pian, FNP  ALPRAZolam (XANAX) 0.5 MG tablet TAKE 1 TABLET 2 TIMES A DAY AS NEEDED FO SLEEP OR ANXIETY    Mary-Margaret Hassell Done, FNP  atorvastatin (LIPITOR) 20 MG tablet TAKE 1 TABLET DAILY 05/29/13   Erby Pian, FNP  azelastine (ASTELIN) 137 MCG/SPRAY nasal spray Use 1-2 sprays each nostril at bedtime 07/19/12   Chipper Herb, MD  citalopram (CELEXA) 20 MG tablet TAKE 1 TABLET TWICE DAILY FOR anxiety 05/29/13   Erby Pian, FNP  denosumab (PROLIA) 60 MG/ML SOLN injection Inject 60 mg into the skin every 6 (six) months. Bring to office for administration. 05/29/13   Tammy Eckard, PHARMD  estradiol (ESTRACE) 0.1 MG/GM vaginal cream Place 1 Applicatorful vaginally 3 (three) times a week.    Historical Provider, MD  fluticasone Asencion Islam) 50 MCG/ACT nasal spray 1-2 sprays each nostril at bedtime 07/19/12   Chipper Herb, MD  furosemide (LASIX) 20 MG tablet TAKE 1 TABLET DAILY 05/29/13   Erby Pian, FNP   levothyroxine (SYNTHROID, LEVOTHROID) 75 MCG tablet Take 1 tablet (75 mcg total) by mouth daily before breakfast. 05/05/13   Erby Pian, FNP  olanzapine-FLUoxetine (SYMBYAX) 6-25 MG per capsule Take 1 capsule by mouth every morning.      Historical Provider, MD  omeprazole (PRILOSEC) 40 MG capsule TAKE (1) CAPSULE DAILY 05/29/13   Erby Pian, FNP  potassium chloride SA (K-DUR,KLOR-CON) 20 MEQ tablet Take 1 tablet (20 mEq total) by mouth daily. 05/29/13   Erby Pian, FNP  traMADol (ULTRAM) 50 MG tablet Take 1 tablet (50 mg total) by mouth every 12 (twelve) hours as needed. 05/29/13   Erby Pian, FNP  verapamil (CALAN-SR) 240 MG CR tablet TAKE 1 & 1/2 TABLETS DAILY. 05/29/13   [name] Loree Fee, FNP   BP 110/56  Pulse 73  Temp(Src) 98.2 F (36.8 C) (Oral)  Resp 18  Ht 5' 0.5" (1.537 m)  Wt 190 lb (86.183 kg)  BMI 36.48 kg/m2  SpO2 99% Physical Exam  Nursing note and vitals reviewed. Constitutional: She is oriented to person, place, and time. She appears well-developed and well-nourished. No distress.  HENT:  Head: Normocephalic and atraumatic.  Eyes: Conjunctivae and EOM are normal.  Neck: Normal range of motion. No tracheal deviation present.  Cardiovascular: Normal rate.   Pulmonary/Chest: Effort normal. No respiratory distress.  Abdominal: There is tenderness in the right lower quadrant and suprapubic area.  Musculoskeletal: Normal range of motion.  Neurological: She is alert and oriented to person, place, and time.  Skin: Skin is warm and dry.  Psychiatric: She has a normal mood and affect. Her behavior is normal.    ED Course  Procedures (including critical care time) 1:44 PM-Discussed treatment plan which includes CT of abdomen with pt at bedside and pt agreed to plan.   Labs Review Labs Reviewed  COMPREHENSIVE METABOLIC PANEL - Abnormal; Notable for the following:    GFR calc non Af Amer 84 (*)    All other components within normal limits  URINALYSIS,  ROUTINE W REFLEX MICROSCOPIC - Abnormal; Notable for the following:    Specific Gravity, Urine <1.005 (*)    Leukocytes, UA SMALL (*)    All other components within normal limits  URINE MICROSCOPIC-ADD ON - Abnormal; Notable for the following:    Squamous Epithelial / LPF FEW (*)    All other components within normal limits  CBC WITH DIFFERENTIAL  LIPASE, BLOOD  CBC    Imaging Review Ct Abdomen Pelvis Wo Contrast  07/31/2013   CLINICAL DATA:  Abdominal pain associated with dysuria and back pain mostly on the right side.  EXAM: CT ABDOMEN AND  PELVIS WITHOUT CONTRAST  TECHNIQUE: Multidetector CT imaging of the abdomen and pelvis was performed following the standard protocol without IV contrast.  COMPARISON:  01/02/2011  FINDINGS: The appendix is dilated at its tip, measuring 8 mm, associated with subtle inflammatory changes in the adjacent fat.  There are left colon diverticula. No diverticulitis. Small bowel is unremarkable.  Minor lung base subsegmental atelectasis and/or scarring.  Liver, spleen, gallbladder, pancreas, adrenal glands:  Normal.  Kidneys are normal in size and position. No renal masses. No stones. No hydronephrosis. Normal ureters. Normal bladder.  Uterus is unremarkable.  No adnexal masses.  No pathologically enlarged lymph nodes.  No ascites.  There are degenerative changes throughout the lumbar spine. No osteoblastic or osteolytic lesions.  IMPRESSION: 1. The appendiceal tip is dilated to 8 mm in shows subtle adjacent inflammation. This is consistent with early acute appendicitis in the proper clinical setting. No extraluminal air or evidence of an abscess. No other evidence of an acute abnormality. 2. Normal kidneys. No renal or ureteral stones or obstructive uropathy. 3. Left colon diverticula.  No diverticulitis.   Electronically Signed   By: Lajean Manes M.D.   On: 07/31/2013 14:58   Update: Patient aware of all results.   I discussed patient's case with our surgical  team.  MDM   I personally performed the services described in this documentation, which was scribed in my presence. The recorded information has been reviewed and is accurate.   Patient presents with new right lower quadrant abdominal pain, though with no nausea, vomiting, other history classic for appendicitis.  However, the patient's CT scan suggests early appendicitis. Patient's evaluation is otherwise largely reassuring, with only mild leukocytes in her urine. Patient remained hemodynamically stable in the emergency department, and was admitted for further evaluation and management by our surgical team.   Carmin Muskrat, MD 07/31/13 [phone]

## 2013-08-01 ENCOUNTER — Observation Stay (HOSPITAL_COMMUNITY): Payer: PRIVATE HEALTH INSURANCE

## 2013-08-01 ENCOUNTER — Encounter (HOSPITAL_COMMUNITY): Payer: PRIVATE HEALTH INSURANCE | Admitting: Anesthesiology

## 2013-08-01 ENCOUNTER — Encounter (HOSPITAL_COMMUNITY): Payer: Self-pay | Admitting: *Deleted

## 2013-08-01 ENCOUNTER — Observation Stay (HOSPITAL_COMMUNITY): Payer: PRIVATE HEALTH INSURANCE | Admitting: Anesthesiology

## 2013-08-01 ENCOUNTER — Encounter (HOSPITAL_COMMUNITY)
Admission: EM | Disposition: A | Discharge status: Home / Self Care | Payer: Self-pay | Source: Home / Self Care | Attending: Emergency Medicine

## 2013-08-01 DIAGNOSIS — K3580 Unspecified acute appendicitis: Secondary | ICD-10-CM | POA: Diagnosis not present

## 2013-08-01 DIAGNOSIS — F3289 Other specified depressive episodes: Secondary | ICD-10-CM | POA: Diagnosis not present

## 2013-08-01 DIAGNOSIS — E7800 Pure hypercholesterolemia, unspecified: Secondary | ICD-10-CM | POA: Diagnosis not present

## 2013-08-01 DIAGNOSIS — F329 Major depressive disorder, single episode, unspecified: Secondary | ICD-10-CM | POA: Diagnosis not present

## 2013-08-01 DIAGNOSIS — I10 Essential (primary) hypertension: Secondary | ICD-10-CM | POA: Diagnosis not present

## 2013-08-01 HISTORY — PX: LAPAROSCOPIC APPENDECTOMY: SHX408

## 2013-08-01 LAB — CBC
HCT: 36.2 % (ref 36.0–46.0)
Hemoglobin: 12.2 g/dL (ref 12.0–15.0)
MCH: 29 pg (ref 26.0–34.0)
MCHC: 33.7 g/dL (ref 30.0–36.0)
MCV: 86.2 fL (ref 78.0–100.0)
Platelets: 248 10*3/uL (ref 150–400)
RBC: 4.2 MIL/uL (ref 3.87–5.11)
RDW: 14.4 % (ref 11.5–15.5)
WBC: 7.9 10*3/uL (ref 4.0–10.5)

## 2013-08-01 LAB — SURGICAL PCR SCREEN
MRSA, PCR: NEGATIVE
Staphylococcus aureus: NEGATIVE

## 2013-08-01 SURGERY — APPENDECTOMY, LAPAROSCOPIC
Anesthesia: General | Site: Abdomen

## 2013-08-01 MED ORDER — FENTANYL CITRATE 0.05 MG/ML IJ SOLN
INTRAMUSCULAR | Status: DC | PRN
  Administered 2013-08-01 (×2): 50 ug via INTRAVENOUS

## 2013-08-01 MED ORDER — ONDANSETRON HCL 4 MG/2ML IJ SOLN: 4.0000 mg | Freq: Once | INTRAMUSCULAR | Status: DC | PRN

## 2013-08-01 MED ORDER — CIPROFLOXACIN IN D5W 400 MG/200ML IV SOLN
400.0000 mg | Freq: Two times a day (BID) | INTRAVENOUS | Status: DC
  Administered 2013-08-01 – 2013-08-02 (×2): 400 mg via INTRAVENOUS
  Filled 2013-08-01 (×2): qty 200

## 2013-08-01 MED ORDER — PROPOFOL 10 MG/ML IV EMUL
INTRAVENOUS | Status: AC
  Filled 2013-08-01: qty 20

## 2013-08-01 MED ORDER — FENTANYL CITRATE 0.05 MG/ML IJ SOLN
INTRAMUSCULAR | Status: AC
Start: 2013-08-01 — End: 2013-08-01
  Filled 2013-08-01: qty 5

## 2013-08-01 MED ORDER — PANTOPRAZOLE SODIUM 40 MG PO TBEC: 40.0000 mg | DELAYED_RELEASE_TABLET | Freq: Every day | ORAL | Status: DC

## 2013-08-01 MED ORDER — SODIUM CHLORIDE 0.9 % IR SOLN
Status: DC | PRN
  Administered 2013-08-01: 1000 mL

## 2013-08-01 MED ORDER — SUCCINYLCHOLINE CHLORIDE 20 MG/ML IJ SOLN
INTRAMUSCULAR | Status: AC
  Filled 2013-08-01: qty 1

## 2013-08-01 MED ORDER — GLYCOPYRROLATE 0.2 MG/ML IJ SOLN
INTRAMUSCULAR | Status: AC
  Filled 2013-08-01: qty 2

## 2013-08-01 MED ORDER — NEOSTIGMINE METHYLSULFATE 10 MG/10ML IV SOLN
INTRAVENOUS | Status: DC | PRN
  Administered 2013-08-01: 1 mg via INTRAVENOUS
  Administered 2013-08-01: 3 mg via INTRAVENOUS

## 2013-08-01 MED ORDER — LEVOTHYROXINE SODIUM 75 MCG PO TABS
75.0000 ug | ORAL_TABLET | Freq: Every day | ORAL | Status: DC
  Administered 2013-08-02: 75 ug via ORAL
  Filled 2013-08-01: qty 1

## 2013-08-01 MED ORDER — LIDOCAINE HCL 1 % IJ SOLN
INTRAMUSCULAR | Status: DC | PRN
  Administered 2013-08-01: 50 mg via INTRADERMAL

## 2013-08-01 MED ORDER — BUPIVACAINE HCL (PF) 0.5 % IJ SOLN
INTRAMUSCULAR | Status: AC
  Filled 2013-08-01: qty 30

## 2013-08-01 MED ORDER — LACTATED RINGERS IV SOLN
INTRAVENOUS | Status: DC
  Administered 2013-08-01: 12:00:00 via INTRAVENOUS

## 2013-08-01 MED ORDER — MIDAZOLAM HCL 2 MG/2ML IJ SOLN
INTRAMUSCULAR | Status: AC
  Filled 2013-08-01: qty 2

## 2013-08-01 MED ORDER — ONDANSETRON HCL 4 MG/2ML IJ SOLN: 4.0000 mg | Freq: Four times a day (QID) | INTRAMUSCULAR | Status: DC | PRN

## 2013-08-01 MED ORDER — SUCCINYLCHOLINE CHLORIDE 20 MG/ML IJ SOLN
INTRAMUSCULAR | Status: DC | PRN
  Administered 2013-08-01: 160 mg via INTRAVENOUS

## 2013-08-01 MED ORDER — ONDANSETRON HCL 4 MG/2ML IJ SOLN
4.0000 mg | Freq: Once | INTRAMUSCULAR | Status: AC
  Administered 2013-08-01: 4 mg via INTRAVENOUS

## 2013-08-01 MED ORDER — OLANZAPINE-FLUOXETINE HCL 6-25 MG PO CAPS
1.0000 | ORAL_CAPSULE | ORAL | Status: DC
  Administered 2013-08-02: 1 via ORAL
  Filled 2013-08-01 (×3): qty 1

## 2013-08-01 MED ORDER — ACETAMINOPHEN 325 MG PO TABS: 650.0000 mg | ORAL_TABLET | Freq: Four times a day (QID) | ORAL | Status: DC | PRN

## 2013-08-01 MED ORDER — FUROSEMIDE 20 MG PO TABS
20.0000 mg | ORAL_TABLET | Freq: Every day | ORAL | Status: DC
  Administered 2013-08-02: 20 mg via ORAL
  Filled 2013-08-01: qty 1

## 2013-08-01 MED ORDER — VERAPAMIL HCL ER 240 MG PO TBCR
240.0000 mg | EXTENDED_RELEASE_TABLET | Freq: Every day | ORAL | Status: DC
  Administered 2013-08-02: 240 mg via ORAL
  Filled 2013-08-01: qty 1

## 2013-08-01 MED ORDER — ONDANSETRON HCL 4 MG PO TABS: 4.0000 mg | ORAL_TABLET | Freq: Four times a day (QID) | ORAL | Status: DC | PRN

## 2013-08-01 MED ORDER — MIDAZOLAM HCL 5 MG/5ML IJ SOLN
INTRAMUSCULAR | Status: DC | PRN
  Administered 2013-08-01 (×2): 1 mg via INTRAVENOUS

## 2013-08-01 MED ORDER — LIDOCAINE HCL (PF) 1 % IJ SOLN
INTRAMUSCULAR | Status: AC
  Filled 2013-08-01: qty 5

## 2013-08-01 MED ORDER — CITALOPRAM HYDROBROMIDE 20 MG PO TABS
20.0000 mg | ORAL_TABLET | Freq: Every day | ORAL | Status: DC
  Administered 2013-08-02: 20 mg via ORAL
  Filled 2013-08-01: qty 1

## 2013-08-01 MED ORDER — GLYCOPYRROLATE 0.2 MG/ML IJ SOLN
INTRAMUSCULAR | Status: DC | PRN
  Administered 2013-08-01: 0.4 mg via INTRAVENOUS

## 2013-08-01 MED ORDER — FENTANYL CITRATE 0.05 MG/ML IJ SOLN: 25.0000 ug | INTRAMUSCULAR | Status: DC | PRN

## 2013-08-01 MED ORDER — GLYCOPYRROLATE 0.2 MG/ML IJ SOLN
0.2000 mg | Freq: Once | INTRAMUSCULAR | Status: AC
  Administered 2013-08-01: 0.2 mg via INTRAVENOUS

## 2013-08-01 MED ORDER — ALPRAZOLAM 1 MG PO TABS
1.0000 mg | ORAL_TABLET | Freq: Every day | ORAL | Status: DC
  Administered 2013-08-01: 1 mg via ORAL
  Filled 2013-08-01: qty 1

## 2013-08-01 MED ORDER — METRONIDAZOLE IN NACL 5-0.79 MG/ML-% IV SOLN
500.0000 mg | Freq: Three times a day (TID) | INTRAVENOUS | Status: DC
  Administered 2013-08-01 – 2013-08-02 (×2): 500 mg via INTRAVENOUS
  Filled 2013-08-01 (×2): qty 100

## 2013-08-01 MED ORDER — FLUTICASONE PROPIONATE 50 MCG/ACT NA SUSP
1.0000 | Freq: Every day | NASAL | Status: DC
  Administered 2013-08-02: 1 via NASAL

## 2013-08-01 MED ORDER — MIDAZOLAM HCL 2 MG/2ML IJ SOLN
1.0000 mg | INTRAMUSCULAR | Status: DC | PRN
  Administered 2013-08-01 (×2): 2 mg via INTRAVENOUS

## 2013-08-01 MED ORDER — ALBUTEROL SULFATE (2.5 MG/3ML) 0.083% IN NEBU: 3.0000 mL | INHALATION_SOLUTION | Freq: Four times a day (QID) | RESPIRATORY_TRACT | Status: DC | PRN

## 2013-08-01 MED ORDER — PROPOFOL 10 MG/ML IV BOLUS
INTRAVENOUS | Status: DC | PRN
  Administered 2013-08-01: 120 mg via INTRAVENOUS

## 2013-08-01 MED ORDER — POVIDONE-IODINE 10 % EX OINT
TOPICAL_OINTMENT | CUTANEOUS | Status: AC
  Filled 2013-08-01: qty 1

## 2013-08-01 MED ORDER — ONDANSETRON HCL 4 MG/2ML IJ SOLN
INTRAMUSCULAR | Status: AC
  Filled 2013-08-01: qty 2

## 2013-08-01 MED ORDER — ENOXAPARIN SODIUM 40 MG/0.4ML ~~LOC~~ SOLN
40.0000 mg | SUBCUTANEOUS | Status: DC
  Administered 2013-08-01: 40 mg via SUBCUTANEOUS
  Filled 2013-08-01: qty 0.4

## 2013-08-01 MED ORDER — ROCURONIUM BROMIDE 50 MG/5ML IV SOLN
INTRAVENOUS | Status: AC
  Filled 2013-08-01: qty 1

## 2013-08-01 MED ORDER — CHLORHEXIDINE GLUCONATE 4 % EX LIQD: 1.0000 "application " | Freq: Once | CUTANEOUS | Status: DC

## 2013-08-01 MED ORDER — BUPIVACAINE HCL (PF) 0.5 % IJ SOLN
INTRAMUSCULAR | Status: DC | PRN
  Administered 2013-08-01: 10 mL

## 2013-08-01 MED ORDER — TRAMADOL HCL 50 MG PO TABS
50.0000 mg | ORAL_TABLET | Freq: Four times a day (QID) | ORAL | Status: DC | PRN
  Administered 2013-08-01: 50 mg via ORAL
  Filled 2013-08-01: qty 1

## 2013-08-01 MED ORDER — GLYCOPYRROLATE 0.2 MG/ML IJ SOLN
INTRAMUSCULAR | Status: AC
  Filled 2013-08-01: qty 1

## 2013-08-01 MED ORDER — ROCURONIUM BROMIDE 100 MG/10ML IV SOLN
INTRAVENOUS | Status: DC | PRN
  Administered 2013-08-01: 20 mg via INTRAVENOUS

## 2013-08-01 SURGICAL SUPPLY — 46 items
BAG HAMPER (MISCELLANEOUS) ×2 IMPLANT
CLOTH BEACON ORANGE TIMEOUT ST (SAFETY) ×2 IMPLANT
COVER LIGHT HANDLE STERIS (MISCELLANEOUS) ×4 IMPLANT
CUTTER FLEX LINEAR 45M (STAPLE) IMPLANT
CUTTER LINEAR ENDO 35 ETS (STAPLE) IMPLANT
CUTTER LINEAR ENDO 35 ETS TH (STAPLE) ×2 IMPLANT
DECANTER SPIKE VIAL GLASS SM (MISCELLANEOUS) ×2 IMPLANT
DISSECTOR BLUNT TIP ENDO 5MM (MISCELLANEOUS) IMPLANT
DURAPREP 26ML APPLICATOR (WOUND CARE) ×2 IMPLANT
ELECT REM PT RETURN 9FT ADLT (ELECTROSURGICAL) ×2
ELECTRODE REM PT RTRN 9FT ADLT (ELECTROSURGICAL) ×1 IMPLANT
FILTER SMOKE EVAC LAPAROSHD (FILTER) IMPLANT
FORMALIN 10 PREFIL 120ML (MISCELLANEOUS) ×2 IMPLANT
GLOVE BIOGEL PI IND STRL 7.0 (GLOVE) ×2 IMPLANT
GLOVE BIOGEL PI INDICATOR 7.0 (GLOVE) ×2
GLOVE EXAM NITRILE PF LG BLUE (GLOVE) ×2 IMPLANT
GLOVE SS BIOGEL STRL SZ 6.5 (GLOVE) ×1 IMPLANT
GLOVE SUPERSENSE BIOGEL SZ 6.5 (GLOVE) ×1
GLOVE SURG SS PI 7.5 STRL IVOR (GLOVE) ×4 IMPLANT
GOWN STRL REUS W/TWL LRG LVL3 (GOWN DISPOSABLE) ×4 IMPLANT
INST SET LAPROSCOPIC AP (KITS) ×2 IMPLANT
IV NS IRRIG 3000ML ARTHROMATIC (IV SOLUTION) IMPLANT
KIT ROOM TURNOVER APOR (KITS) ×2 IMPLANT
MANIFOLD NEPTUNE II (INSTRUMENTS) ×2 IMPLANT
NEEDLE INSUFFLATION 14GA 120MM (NEEDLE) ×2 IMPLANT
NS IRRIG 1000ML POUR BTL (IV SOLUTION) ×2 IMPLANT
PACK LAP CHOLE LZT030E (CUSTOM PROCEDURE TRAY) ×2 IMPLANT
PAD ARMBOARD 7.5X6 YLW CONV (MISCELLANEOUS) ×2 IMPLANT
POUCH SPECIMEN RETRIEVAL 10MM (ENDOMECHANICALS) ×2 IMPLANT
RELOAD /EVU35 (ENDOMECHANICALS) IMPLANT
RELOAD 45 VASCULAR/THIN (ENDOMECHANICALS) IMPLANT
RELOAD CUTTER ETS 35MM STAND (ENDOMECHANICALS) IMPLANT
SCALPEL HARMONIC ACE (MISCELLANEOUS) ×2 IMPLANT
SET BASIN LINEN APH (SET/KITS/TRAYS/PACK) ×2 IMPLANT
SET TUBE IRRIG SUCTION NO TIP (IRRIGATION / IRRIGATOR) IMPLANT
SPONGE GAUZE 2X2 8PLY STRL LF (GAUZE/BANDAGES/DRESSINGS) ×6 IMPLANT
STAPLER VISISTAT (STAPLE) ×2 IMPLANT
SUT VICRYL 0 UR6 27IN ABS (SUTURE) ×2 IMPLANT
TAPE CLOTH SURG 4X10 WHT LF (GAUZE/BANDAGES/DRESSINGS) ×2 IMPLANT
TRAY FOLEY CATH 16FR SILVER (SET/KITS/TRAYS/PACK) ×2 IMPLANT
TROCAR ENDO BLADELESS 11MM (ENDOMECHANICALS) ×2 IMPLANT
TROCAR ENDO BLADELESS 12MM (ENDOMECHANICALS) ×2 IMPLANT
TROCAR XCEL NON-BLD 5MMX100MML (ENDOMECHANICALS) ×2 IMPLANT
TUBING INSUFFLATION (TUBING) ×2 IMPLANT
WARMER LAPAROSCOPE (MISCELLANEOUS) ×2 IMPLANT
YANKAUER SUCT 12FT TUBE ARGYLE (SUCTIONS) ×2 IMPLANT

## 2013-08-01 NOTE — Anesthesia Preprocedure Evaluation (Signed)
Anesthesia Evaluation  Patient identified by MRN, date of birth, ID band Patient awake    Reviewed: Allergy & Precautions, H&P , NPO status , Patient's Chart, lab work & pertinent test results  Airway Mallampati: II TM Distance: <3 FB Neck ROM: Full    Dental  (+) Teeth Intact   Pulmonary former smoker,  breath sounds clear to auscultation        Cardiovascular hypertension, Pt. on medications Rhythm:Regular Rate:Normal     Neuro/Psych PSYCHIATRIC DISORDERS Depression    GI/Hepatic PUD,   Endo/Other  Hypothyroidism   Renal/GU      Musculoskeletal   Abdominal   Peds  Hematology  (+) anemia ,   Anesthesia Other Findings   Reproductive/Obstetrics                           Anesthesia Physical Anesthesia Plan  ASA: II  Anesthesia Plan: General   Post-op Pain Management:    Induction: Intravenous, Rapid sequence and Cricoid pressure planned  Airway Management Planned: Oral ETT  Additional Equipment:   Intra-op Plan:   Post-operative Plan: Extubation in OR  Informed Consent: I have reviewed the patients History and Physical, chart, labs and discussed the procedure including the risks, benefits and alternatives for the proposed anesthesia with the patient or authorized representative who has indicated his/her understanding and acceptance.     Plan Discussed with:   Anesthesia Plan Comments:         Anesthesia Quick Evaluation

## 2013-08-01 NOTE — Progress Notes (Signed)
Pt ambulated hallway without c/o pain and no s/s of distress.  Pt ate 100% of lunch and dinner.  Pt has had several episodes of flatus but no bowel movements at this time.  Pt states she is not hurting but just feels "sore".

## 2013-08-01 NOTE — Progress Notes (Signed)
Subjective: Patient still with right lower quadrant abdominal pain which is about the same as it was yesterday evening.  Objective: Vital signs in last 24 hours: Temp:  [97.7 F (36.5 C)-98.3 F (36.8 C)] 97.7 F (36.5 C) (06/09 0558) Pulse Rate:  [63-73] 63 (06/09 0558) Resp:  [18-20] 18 (06/09 0558) BP: (110-178)/(56-83) 165/82 mmHg (06/09 0558) SpO2:  [95 %-99 %] 99 % (06/09 0558) Weight:  [86.183 kg (190 lb)-87.8 kg (193 lb 9 oz)] 87.8 kg (193 lb 9 oz) (06/08 1812) Last BM Date: 07/31/13  Intake/Output from previous day: 06/08 0701 - 06/09 0700 In: 2266.7 [P.O.:480; I.V.:1186.7; IV Piggyback:600] Out: 2750 [Urine:2750] Intake/Output this shift:    General appearance: alert, cooperative and no distress Resp: clear to auscultation bilaterally Cardio: regular rate and rhythm, S1, S2 normal, no murmur, click, rub or gallop GI: Soft. Tender in right lower corner and to deep palpation. No rigidity noted.  Lab Results:   Recent Labs  07/31/13 1253 08/01/13 0601  WBC 10.2 7.9  HGB 12.9 12.2  HCT 37.6 36.2  PLT 280 248   BMET  Recent Labs  07/31/13 1253  NA 140  K 4.1  CL 99  CO2 28  GLUCOSE 98  BUN 14  CREATININE 0.72  CALCIUM 10.3   PT/INR No results found for this basename: LABPROT, INR,  in the last 72 hours  Studies/Results: Ct Abdomen Pelvis Wo Contrast  07/31/2013   CLINICAL DATA:  Abdominal pain associated with dysuria and back pain mostly on the right side.  EXAM: CT ABDOMEN AND PELVIS WITHOUT CONTRAST  TECHNIQUE: Multidetector CT imaging of the abdomen and pelvis was performed following the standard protocol without IV contrast.  COMPARISON:  01/02/2011  FINDINGS: The appendix is dilated at its tip, measuring 8 mm, associated with subtle inflammatory changes in the adjacent fat.  There are left colon diverticula. No diverticulitis. Small bowel is unremarkable.  Minor lung base subsegmental atelectasis and/or scarring.  Liver, spleen, gallbladder,  pancreas, adrenal glands:  Normal.  Kidneys are normal in size and position. No renal masses. No stones. No hydronephrosis. Normal ureters. Normal bladder.  Uterus is unremarkable.  No adnexal masses.  No pathologically enlarged lymph nodes.  No ascites.  There are degenerative changes throughout the lumbar spine. No osteoblastic or osteolytic lesions.  IMPRESSION: 1. The appendiceal tip is dilated to 8 mm in shows subtle adjacent inflammation. This is consistent with early acute appendicitis in the proper clinical setting. No extraluminal air or evidence of an abscess. No other evidence of an acute abnormality. 2. Normal kidneys. No renal or ureteral stones or obstructive uropathy. 3. Left colon diverticula.  No diverticulitis.   Electronically Signed   By: Lajean Manes M.D.   On: 07/31/2013 14:58    Anti-infectives: Anti-infectives   Start     Dose/Rate Route Frequency Ordered Stop   07/31/13 1830  ciprofloxacin (CIPRO) IVPB 400 mg     400 mg 200 mL/hr over 60 Minutes Intravenous Every 12 hours 07/31/13 1809     07/31/13 1830  metroNIDAZOLE (FLAGYL) IVPB 500 mg     500 mg 100 mL/hr over 60 Minutes Intravenous Every 8 hours 07/31/13 1809        Assessment/Plan: Impression: Acute appendicitis Plan: We'll take patient to the operating room for laparoscopic appendectomy. The risks and benefits of the procedure including bleeding, infection, and the possibility of an open procedure were fully explained to the patient, who gave informed consent.  LOS: 1 day  Jamesetta So 08/01/2013

## 2013-08-01 NOTE — Op Note (Signed)
Patient:  Sheila [name]  DOB:  1940/05/27  MRN:  [id number]   Preop Diagnosis:  Acute appendicitis  Postop Diagnosis:  Same  Procedure:  Laparoscopic appendectomy  Surgeon:  Aviva Signs, M.D.  Anes:  General endotracheal  Indications:  Patient is a 73 year old white female who presented the emergency room yesterday with worsening lower, pain. CT scan the abdomen suggested the possibility of distal tip acute appendicitis. She was admitted to the hospital for further evaluation treatment. This morning, she continued to have right lower quadrant abdominal pain, thus she now comes the operating room for laparoscopic appendectomy. The risks and benefits of the procedure including bleeding, infection, and the possibility of an open procedure were fully explained to the patient, who gave informed consent.  Procedure note:  The patient was placed the supine position. After induction of general endotracheal anesthesia, the abdomen was prepped and draped using usual sterile technique with DuraPrep. Surgical site confirmation was performed.  A supraumbilical incision was made down to the fascia. A Veress needle was introduced into the abdominal cavity and confirmation of placement was done using the saline drop test. The abdomen was then insufflated to 16 mm mercury pressure. An 11 mm trocar was introduced into the abdominal cavity under direct visualization without difficulty. The patient was placed in Trendelenburg position and an additional 12 mm port trocar was placed the suprapubic region and a 5 mm trocar was placed left lower quadrant region. The appendix was visualized and the distal tip was noted to be acutely inflamed. It was a very long appendix. There was no evidence of rupture. The mesoappendix was divided using the harmonic scalpel. A vascular Endo GIA was placed across the base the appendix and fired. The appendix was then removed using an Endo Catch bag without difficulty. The staple line was  inspected and noted within normal limits. All fluid and air were then evacuated from the, cavity prior to removal of the trochars.  All wounds were irrigated with normal saline. All wounds were checked with 0.5% Sensorcaine. The supraumbilical fascia as well as suprapubic fascia were reapproximated using 0 Vicryl interrupted sutures. All skin incisions were closed using staples. Betadine ointment and dry sterile dressings were applied.  All tape and needle counts were correct at the end of the procedure. Patient was extubated in the operating room and transferred to PACU in stable condition.  Complications:  None  EBL:  Minimal  Specimen:  Appendix

## 2013-08-01 NOTE — Anesthesia Procedure Notes (Signed)
Procedure Name: Intubation Date/Time: 08/01/2013 12:46 PM Performed by: Charmaine Downs Pre-anesthesia Checklist: Patient being monitored, Suction available, Emergency Drugs available and Patient identified Patient Re-evaluated:Patient Re-evaluated prior to inductionOxygen Delivery Method: Circle system utilized Preoxygenation: Pre-oxygenation with 100% oxygen Intubation Type: IV induction, Rapid sequence and Cricoid Pressure applied Ventilation: Mask ventilation without difficulty and Oral airway inserted - appropriate to patient size Laryngoscope Size: Mac and 3 Grade View: Grade I Tube type: Oral Tube size: 7.0 mm Number of attempts: 1 Airway Equipment and Method: Stylet Placement Confirmation: ETT inserted through vocal cords under direct vision,  positive ETCO2 and breath sounds checked- equal and bilateral Secured at: 22 cm Tube secured with: Tape Dental Injury: Teeth and Oropharynx as per pre-operative assessment  Difficulty Due To: Difficulty was anticipated

## 2013-08-01 NOTE — Anesthesia Postprocedure Evaluation (Signed)
  Anesthesia Post-op Note  Patient: Sheila [name]  Procedure(s) Performed: Procedure(s): APPENDECTOMY LAPAROSCOPIC (N/A)  Patient Location: PACU  Anesthesia Type:General  Level of Consciousness: awake, alert , oriented and patient cooperative  Airway and Oxygen Therapy: Patient Spontanous Breathing  Post-op Pain: 3 /10, mild  Post-op Assessment: Post-op Vital signs reviewed, Patient's Cardiovascular Status Stable, Respiratory Function Stable, Patent Airway, No signs of Nausea or vomiting and Pain level controlled  Post-op Vital Signs: Reviewed and stable  Last Vitals:  Filed Vitals:   08/01/13 1230  BP: 111/78  Pulse:   Temp:   Resp: 15    Complications: No apparent anesthesia complications

## 2013-08-01 NOTE — OR Nursing (Signed)
Up to bathroom to void twice while waiting in preop.

## 2013-08-01 NOTE — Transfer of Care (Signed)
Immediate Anesthesia Transfer of Care Note  Patient: Sheila [name]  Procedure(s) Performed: Procedure(s): APPENDECTOMY LAPAROSCOPIC (N/A)  Patient Location: PACU  Anesthesia Type:General  Level of Consciousness: awake and patient cooperative  Airway & Oxygen Therapy: Patient Spontanous Breathing and Patient connected to face mask oxygen  Post-op Assessment: Report given to PACU RN, Post -op Vital signs reviewed and stable and Patient moving all extremities  Post vital signs: Reviewed and stable  Complications: No apparent anesthesia complications

## 2013-08-01 NOTE — Progress Notes (Signed)
UR completed 

## 2013-08-02 ENCOUNTER — Encounter (HOSPITAL_COMMUNITY): Payer: Self-pay | Admitting: General Surgery

## 2013-08-02 DIAGNOSIS — K3580 Unspecified acute appendicitis: Secondary | ICD-10-CM | POA: Diagnosis not present

## 2013-08-02 LAB — CBC
HCT: 35.4 % — ABNORMAL LOW (ref 36.0–46.0)
Hemoglobin: 12.2 g/dL (ref 12.0–15.0)
MCH: 29.5 pg (ref 26.0–34.0)
MCHC: 34.5 g/dL (ref 30.0–36.0)
MCV: 85.5 fL (ref 78.0–100.0)
Platelets: 240 10*3/uL (ref 150–400)
RBC: 4.14 MIL/uL (ref 3.87–5.11)
RDW: 14.2 % (ref 11.5–15.5)
WBC: 9.4 10*3/uL (ref 4.0–10.5)

## 2013-08-02 LAB — BASIC METABOLIC PANEL
BUN: 8 mg/dL (ref 6–23)
CO2: 26 mEq/L (ref 19–32)
Calcium: 9.6 mg/dL (ref 8.4–10.5)
Chloride: 101 mEq/L (ref 96–112)
Creatinine, Ser: 0.71 mg/dL (ref 0.50–1.10)
GFR calc Af Amer: >90 mL/min (ref >90)
GFR calc non Af Amer: 84 mL/min — ABNORMAL LOW (ref >90)
Glucose, Bld: 129 mg/dL — ABNORMAL HIGH (ref 70–99)
Potassium: 3.4 mEq/L — ABNORMAL LOW (ref 3.7–5.3)
Sodium: 142 mEq/L (ref 137–147)

## 2013-08-02 MED ORDER — TRAMADOL HCL 50 MG PO TABS: 50.0000 mg | ORAL_TABLET | Freq: Four times a day (QID) | ORAL | Status: DC | PRN

## 2013-08-02 NOTE — Progress Notes (Signed)
IV removed, site WNL.  Pt and her caregiver given d/c instructions and new prescriptions.  Discussed all home medications (when, how, and why to take), patient verbalizes understanding. Discussed home care and incision care with patient, teachback completed. F/U appointment in place, pt states they will keep appointment. Pt is stable at this time. Pt taken to main entrance in wheelchair by staff member.

## 2013-08-02 NOTE — Discharge Summary (Signed)
Physician Discharge Summary  Patient ID: Sheila [name] MRN: [id number] DOB/AGE: [birth year] 73 y.o.  Admit date: 07/31/2013 Discharge date: 08/02/2013  Admission Diagnoses: Abdominal pain  Discharge Diagnoses: Acute appendicitis Active Problems:   Abdominal pain   Discharged Condition: good  Hospital Course: Patient is a 73 year old white female presented emergency room with four-day history of worsening lower abdominal pain. CT scan the abdomen and pelvis revealed possible distal tip acute appendicitis. She was brought in to the hospital under observation status for monitoring. The following day, she continued to have right lower quadrant abdominal pain. She was subsequently taken to the operating room on 08/01/2013 and underwent laparoscopic appendectomy. A distal tip appendicitis was found. There was no evidence of perforation. She tolerated the procedure well. Postoperative course was unremarkable. Her diet was advanced without difficulty. The patient is being discharged home in good improving condition.  Treatments: surgery: Laparoscopic appendectomy on 08/01/2013  Discharge Exam: Blood pressure 126/79, pulse 70, temperature 97.6 F (36.4 C), temperature source Oral, resp. rate 17, height 5' 0.5" (1.537 m), weight 87.8 kg (193 lb 9 oz), SpO2 99.00%. General appearance: alert, cooperative and no distress Resp: clear to auscultation bilaterally Cardio: regular rate and rhythm, S1, S2 normal, no murmur, click, rub or gallop GI: Soft. Dressings dry and intact.  Disposition: 01-Home or Self Care     Medication List         acetaminophen 650 MG CR tablet  Commonly known as:  TYLENOL  Take 650 mg by mouth every 8 (eight) hours as needed for pain.     albuterol 108 (90 BASE) MCG/ACT inhaler  Commonly known as:  PROVENTIL HFA;VENTOLIN HFA  Inhale 2 puffs into the lungs every 6 (six) hours as needed for wheezing.     ALPRAZolam 0.5 MG tablet  Commonly known as:  XANAX  Take 1 mg by  mouth at bedtime.     atorvastatin 20 MG tablet  Commonly known as:  LIPITOR  TAKE 1 TABLET DAILY     azelastine 0.1 % nasal spray  Commonly known as:  ASTELIN  Use 1-2 sprays each nostril at bedtime     CENTRUM SILVER ADULT 50+ PO  Take 1 tablet by mouth every other day.     citalopram 20 MG tablet  Commonly known as:  CELEXA  Take 20 mg by mouth daily.     denosumab 60 MG/ML Soln injection  Commonly known as:  PROLIA  Inject 60 mg into the skin every 6 (six) months. Bring to office for administration.     diphenhydramine-acetaminophen 25-500 MG Tabs  Commonly known as:  TYLENOL PM  Take 2 tablets by mouth at bedtime as needed (sleep).     estradiol 0.1 MG/GM vaginal cream  Commonly known as:  ESTRACE  Place 1 Applicatorful vaginally 3 (three) times a week.     FISH OIL PO  Take 2 capsules by mouth 2 (two) times daily.     fluticasone 50 MCG/ACT nasal spray  Commonly known as:  FLONASE  1-2 sprays each nostril at bedtime     furosemide 20 MG tablet  Commonly known as:  LASIX  TAKE 1 TABLET DAILY     levothyroxine 75 MCG tablet  Commonly known as:  SYNTHROID, LEVOTHROID  Take 1 tablet (75 mcg total) by mouth daily before breakfast.     OLANZapine-FLUoxetine 6-25 MG per capsule  Commonly known as:  SYMBYAX  Take 1 capsule by mouth every morning.     omeprazole 40  MG capsule  Commonly known as:  PRILOSEC  TAKE (1) CAPSULE DAILY     potassium chloride SA 20 MEQ tablet  Commonly known as:  K-DUR,KLOR-CON  Take 1 tablet (20 mEq total) by mouth daily.     traMADol 50 MG tablet  Commonly known as:  ULTRAM  Take 1 tablet (50 mg total) by mouth every 6 (six) hours as needed.     verapamil 240 MG CR tablet  Commonly known as:  CALAN-SR  TAKE 1 & 1/2 TABLETS DAILY.           Follow-up Information   Follow up with Jamesetta So, MD. Schedule an appointment as soon as possible for a visit on 08/08/2013.   Specialty:  General Surgery   Contact information:    [address] [place] [address] [phone]       Signed: Aviva Signs A 08/02/2013, 8:03 AM

## 2013-08-02 NOTE — Discharge Instructions (Signed)
Laparoscopic Appendectomy °Care After °Refer to this sheet in the next few weeks. These instructions provide you with information on caring for yourself after your procedure. Your caregiver may also give you more specific instructions. Your treatment has been planned according to current medical practices, but problems sometimes occur. Call your caregiver if you have any problems or questions after your procedure. °HOME CARE INSTRUCTIONS °· Do not drive while taking narcotic pain medicines. °· Use stool softener if you become constipated from your pain medicines. °· Change your bandages (dressings) as directed. °· Keep your wounds clean and dry. You may wash the wounds gently with soap and water. Gently pat the wounds dry with a clean towel. °· Do not take baths, swim, or use hot tubs for 10 days, or as instructed by your caregiver. °· Only take over-the-counter or prescription medicines for pain, discomfort, or fever as directed by your caregiver. °· You may continue your normal diet as directed. °· Do not lift more than 10 pounds (4.5 kg) or play contact sports for 3 weeks, or as directed. °· Slowly increase your activity after surgery. °· Take deep breaths to avoid getting a lung infection (pneumonia). °SEEK MEDICAL CARE IF: °· You have redness, swelling, or increasing pain in your wounds. °· You have pus coming from your wounds. °· You have drainage from a wound that lasts longer than 1 day. °· You notice a bad smell coming from the wounds or dressing. °· Your wound edges break open after stitches (sutures) have been removed. °· You notice increasing pain in the shoulders (shoulder strap areas) or near your shoulder blades. °· You develop dizzy episodes or fainting while standing. °· You develop shortness of breath. °· You develop persistent nausea or vomiting. °· You cannot control your bowel functions or lose your appetite. °· You develop diarrhea. °SEEK IMMEDIATE MEDICAL CARE IF:  °· You have a fever. °· You  develop a rash. °· You have difficulty breathing or sharp pains in your chest. °· You develop any reaction or side effects to medicines given. °MAKE SURE YOU: °· Understand these instructions. °· Will watch your condition. °· Will get help right away if you are not doing well or get worse. °Document Released: 02/09/2005 Document Revised: 05/04/2011 Document Reviewed: 08/19/2010 °[employer]® Patient Information ©2014 [employer]. ° °

## 2013-08-08 ENCOUNTER — Ambulatory Visit: Payer: Medicare Other | Admitting: Family

## 2013-08-08 ENCOUNTER — Ambulatory Visit: Payer: Medicare Other | Admitting: General Practice

## 2013-08-18 ENCOUNTER — Encounter: Payer: Self-pay | Admitting: Family

## 2013-08-18 ENCOUNTER — Ambulatory Visit (INDEPENDENT_AMBULATORY_CARE_PROVIDER_SITE_OTHER): Payer: Medicare Other | Admitting: Family

## 2013-08-18 ENCOUNTER — Telehealth: Payer: Self-pay | Admitting: Family

## 2013-08-18 VITALS — BP 128/81 | HR 78 | Temp 97.4°F | Ht 60.0 in | Wt 189.8 lb

## 2013-08-18 DIAGNOSIS — M7989 Other specified soft tissue disorders: Secondary | ICD-10-CM

## 2013-08-18 DIAGNOSIS — M25473 Effusion, unspecified ankle: Secondary | ICD-10-CM

## 2013-08-18 DIAGNOSIS — M25476 Effusion, unspecified foot: Secondary | ICD-10-CM

## 2013-08-18 NOTE — Progress Notes (Signed)
   Subjective:    Patient ID: CLOE SOCKWELL, female    DOB: July 25, 1940, 73 y.o.   MRN: [id number]  HPI Pt presents to the office for bilateral edema in her lower extremities that started 06/17 with intermittent pain. Pt states she had her appendix removed three weeks ago. Pt states it is aching pain that is 10 out 10.     Review of Systems  Cardiovascular: Negative.   Gastrointestinal: Negative.   Endocrine: Negative.   Musculoskeletal: Negative.   Hematological: Negative.   All other systems reviewed and are negative.      Objective:   Physical Exam  Vitals reviewed. Constitutional: She is oriented to person, place, and time. She appears well-developed and well-nourished. No distress.  Cardiovascular: Normal rate, regular rhythm, normal heart sounds and intact distal pulses.   No murmur heard. Pulmonary/Chest: Effort normal and breath sounds normal. No respiratory distress. She has no wheezes.  Abdominal: Soft. Bowel sounds are normal. She exhibits no distension. There is no tenderness.  Musculoskeletal: Normal range of motion. She exhibits edema. She exhibits no tenderness.  Trace amount of swelling in bilateral ankles  Neurological: She is alert and oriented to person, place, and time.  Skin: Skin is warm and dry.  Psychiatric: She has a normal mood and affect. Her behavior is normal. Judgment and thought content normal.   BP 128/81  Pulse 78  Temp(Src) 97.4 F (36.3 C) (Oral)  Ht 5' (1.524 m)  Wt 189 lb 12.8 oz (86.093 kg)  BMI 37.07 kg/m2        Assessment & Plan:  1. Leg swelling -Continue daily lasix -Elevate above heart when sitting -Go to ED for any new SOB, redness in legs, or new swelling - Lower Extremity Venous Duplex Bilateral; Future  2. Ankle swelling, unspecified laterality -Continue daily lasix -Elevate when sitting - Lower Extremity Venous Duplex Bilateral; Future  [name], FNP

## 2013-08-18 NOTE — Patient Instructions (Signed)

## 2013-08-18 NOTE — Telephone Encounter (Signed)
appt made

## 2013-08-22 ENCOUNTER — Ambulatory Visit (HOSPITAL_COMMUNITY)
Admission: RE | Admit: 2013-08-22 | Discharge: 2013-08-22 | Disposition: A | Discharge status: Home / Self Care | Payer: PRIVATE HEALTH INSURANCE | Source: Ambulatory Visit | Attending: Family | Admitting: Family

## 2013-08-22 ENCOUNTER — Other Ambulatory Visit: Payer: Self-pay | Admitting: Family

## 2013-08-22 DIAGNOSIS — R609 Edema, unspecified: Secondary | ICD-10-CM | POA: Diagnosis present

## 2013-08-23 ENCOUNTER — Telehealth: Payer: Self-pay | Admitting: Family

## 2013-08-23 DIAGNOSIS — M79604 Pain in right leg: Secondary | ICD-10-CM

## 2013-08-23 DIAGNOSIS — M79605 Pain in left leg: Principal | ICD-10-CM

## 2013-08-24 ENCOUNTER — Other Ambulatory Visit (INDEPENDENT_AMBULATORY_CARE_PROVIDER_SITE_OTHER): Payer: Medicare Other

## 2013-08-24 DIAGNOSIS — M79609 Pain in unspecified limb: Secondary | ICD-10-CM

## 2013-08-24 DIAGNOSIS — M79605 Pain in left leg: Secondary | ICD-10-CM

## 2013-08-24 DIAGNOSIS — M79604 Pain in right leg: Secondary | ICD-10-CM

## 2013-08-24 MED ORDER — POTASSIUM CHLORIDE CRYS ER 20 MEQ PO TBCR: 20.0000 meq | EXTENDED_RELEASE_TABLET | Freq: Two times a day (BID) | ORAL | Status: DC

## 2013-08-24 NOTE — Telephone Encounter (Signed)
New RX called to pharmacy. Magnesium lab ordered.

## 2013-08-24 NOTE — Progress Notes (Signed)
Pt came in for lab  only 

## 2013-08-25 LAB — MAGNESIUM: Magnesium: 1.8 mg/dL (ref 1.6–2.6)

## 2013-08-29 NOTE — Progress Notes (Signed)
Pt aware of lab results 

## 2013-09-04 ENCOUNTER — Other Ambulatory Visit: Payer: Self-pay | Admitting: Nurse Practitioner

## 2013-09-04 NOTE — Telephone Encounter (Signed)
Patient last seen in office on 08-18-13. Rx last fille don 08-03-13 for #[phone]. Please advise. If approved please route to pool A so nurse can phone in to pharmacy

## 2013-09-05 ENCOUNTER — Ambulatory Visit (INDEPENDENT_AMBULATORY_CARE_PROVIDER_SITE_OTHER): Payer: Medicare Other | Admitting: Family

## 2013-09-05 ENCOUNTER — Encounter: Payer: Self-pay | Admitting: Family

## 2013-09-05 VITALS — BP 102/69 | HR 79 | Temp 98.8°F | Ht 60.0 in | Wt 191.8 lb

## 2013-09-05 DIAGNOSIS — F329 Major depressive disorder, single episode, unspecified: Secondary | ICD-10-CM

## 2013-09-05 DIAGNOSIS — G609 Hereditary and idiopathic neuropathy, unspecified: Secondary | ICD-10-CM

## 2013-09-05 DIAGNOSIS — M545 Low back pain, unspecified: Secondary | ICD-10-CM

## 2013-09-05 DIAGNOSIS — E785 Hyperlipidemia, unspecified: Secondary | ICD-10-CM

## 2013-09-05 DIAGNOSIS — F3289 Other specified depressive episodes: Secondary | ICD-10-CM

## 2013-09-05 DIAGNOSIS — Z130 Encounter for screening for diseases of the blood and blood-forming organs and certain disorders involving the immune mechanism: Secondary | ICD-10-CM

## 2013-09-05 DIAGNOSIS — E039 Hypothyroidism, unspecified: Secondary | ICD-10-CM

## 2013-09-05 DIAGNOSIS — G629 Polyneuropathy, unspecified: Secondary | ICD-10-CM

## 2013-09-05 DIAGNOSIS — Z13228 Encounter for screening for other metabolic disorders: Secondary | ICD-10-CM

## 2013-09-05 DIAGNOSIS — F411 Generalized anxiety disorder: Secondary | ICD-10-CM

## 2013-09-05 DIAGNOSIS — Z1321 Encounter for screening for nutritional disorder: Secondary | ICD-10-CM

## 2013-09-05 DIAGNOSIS — F32A Depression, unspecified: Secondary | ICD-10-CM | POA: Insufficient documentation

## 2013-09-05 DIAGNOSIS — K279 Peptic ulcer, site unspecified, unspecified as acute or chronic, without hemorrhage or perforation: Secondary | ICD-10-CM

## 2013-09-05 DIAGNOSIS — Z1329 Encounter for screening for other suspected endocrine disorder: Secondary | ICD-10-CM

## 2013-09-05 DIAGNOSIS — I10 Essential (primary) hypertension: Secondary | ICD-10-CM

## 2013-09-05 MED ORDER — FUROSEMIDE 20 MG PO TABS: ORAL_TABLET | ORAL | Status: DC

## 2013-09-05 MED ORDER — ATORVASTATIN CALCIUM 20 MG PO TABS: ORAL_TABLET | ORAL | Status: DC

## 2013-09-05 MED ORDER — ALPRAZOLAM 0.5 MG PO TABS: ORAL_TABLET | ORAL | Status: DC

## 2013-09-05 MED ORDER — GABAPENTIN 300 MG PO CAPS: 300.0000 mg | ORAL_CAPSULE | Freq: Three times a day (TID) | ORAL | Status: DC

## 2013-09-05 MED ORDER — OMEPRAZOLE 40 MG PO CPDR: DELAYED_RELEASE_CAPSULE | ORAL | Status: DC

## 2013-09-05 MED ORDER — TRAMADOL HCL 50 MG PO TABS: 50.0000 mg | ORAL_TABLET | Freq: Four times a day (QID) | ORAL | Status: DC | PRN

## 2013-09-05 MED ORDER — CITALOPRAM HYDROBROMIDE 20 MG PO TABS: 20.0000 mg | ORAL_TABLET | Freq: Every day | ORAL | Status: DC

## 2013-09-05 MED ORDER — VERAPAMIL HCL ER 240 MG PO TBCR: EXTENDED_RELEASE_TABLET | ORAL | Status: DC

## 2013-09-05 NOTE — Progress Notes (Signed)
Subjective:    Patient ID: Sheila [name], female    DOB: 1940-12-30, 73 y.o.   MRN: [id number]  Hypertension This is a chronic problem. The current episode started more than 1 year ago. The problem has been resolved since onset. The problem is uncontrolled. Associated symptoms include anxiety. Pertinent negatives include no chest pain, headaches, malaise/fatigue, palpitations, peripheral edema or shortness of breath. Risk factors for coronary artery disease include dyslipidemia, obesity and post-menopausal state. Past treatments include calcium channel blockers and diuretics. The current treatment provides moderate improvement. Hypertensive end-organ damage includes a thyroid problem. There is no history of kidney disease, CAD/MI or heart failure. There is no history of sleep apnea.  Hyperlipidemia This is a chronic problem. The current episode started more than 1 year ago. The problem is uncontrolled. Recent lipid tests were reviewed and are high. Exacerbating diseases include hypothyroidism. She has no history of diabetes. Associated symptoms include leg pain. Pertinent negatives include no chest pain, myalgias or shortness of breath. Current antihyperlipidemic treatment includes statins. The current treatment provides significant improvement of lipids. Risk factors for coronary artery disease include dyslipidemia, family history, hypertension and post-menopausal.  Gastrophageal Reflux She reports no chest pain, no choking, no heartburn, no sore throat or no tooth decay. The current episode started more than 1 year ago. The problem occurs rarely. The problem has been resolved. The symptoms are aggravated by certain foods. She has tried a PPI for the symptoms. The treatment provided significant relief.  Thyroid Problem Presents for follow-up visit. Symptoms include anxiety. Patient reports no constipation, diarrhea, dry skin, hair loss, palpitations or visual change. The symptoms have been stable. Past  treatments include levothyroxine. The treatment provided significant relief. Her past medical history is significant for hyperlipidemia. There is no history of diabetes or heart failure.  Anxiety Presents for follow-up visit. Symptoms include excessive worry, insomnia and nervous/anxious behavior. Patient reports no chest pain, irritability, palpitations, panic or shortness of breath. Symptoms occur occasionally. The quality of sleep is poor.   Her past medical history is significant for anxiety/panic attacks and depression. Past treatments include benzodiazephines and SSRIs. The treatment provided moderate relief. Compliance with prior treatments has been good.      Review of Systems  Constitutional: Negative.  Negative for malaise/fatigue and irritability.  HENT: Negative.  Negative for sore throat.   Eyes: Negative.   Respiratory: Negative.  Negative for choking and shortness of breath.   Cardiovascular: Negative.  Negative for chest pain and palpitations.  Gastrointestinal: Negative.  Negative for heartburn, diarrhea and constipation.  Endocrine: Negative.   Genitourinary: Negative.   Musculoskeletal: Negative.  Negative for myalgias.  Neurological: Negative.  Negative for headaches.  Hematological: Negative.   Psychiatric/Behavioral: The patient is nervous/anxious and has insomnia.   All other systems reviewed and are negative.      Objective:   Physical Exam  Vitals reviewed. Constitutional: She is oriented to person, place, and time. She appears well-developed and well-nourished. No distress.  HENT:  Head: Normocephalic and atraumatic.  Right Ear: External ear normal.  Mouth/Throat: Oropharynx is clear and moist.  Eyes: Pupils are equal, round, and reactive to light.  Neck: Normal range of motion. Neck supple. No thyromegaly present.  Cardiovascular: Normal rate, regular rhythm, normal heart sounds and intact distal pulses.   No murmur heard. Pulmonary/Chest: Effort  normal and breath sounds normal. No respiratory distress. She has no wheezes.  Abdominal: Soft. Bowel sounds are normal. She exhibits no distension. There is no  tenderness.  Musculoskeletal: Normal range of motion. She exhibits no edema and no tenderness.  Neurological: She is alert and oriented to person, place, and time. She has normal reflexes. No cranial nerve deficit.  Skin: Skin is warm and dry.  Psychiatric: She has a normal mood and affect. Her behavior is normal. Judgment and thought content normal.      BP 102/69  Pulse 79  Temp(Src) 98.8 F (37.1 C) (Oral)  Ht 5' (1.524 m)  Wt 191 lb 12.8 oz (87 kg)  BMI 37.46 kg/m2     Assessment & Plan:  1. Essential hypertension, benign - CMP14+EGFR - verapamil (CALAN-SR) 240 MG CR tablet; TAKE 1 & 1/2 TABLETS DAILY.  Dispense: 135 tablet; Refill: 3 - furosemide (LASIX) 20 MG tablet; TAKE 1 TABLET DAILY  Dispense: 90 tablet; Refill: 3  2. Hypothyroidism, unspecified hypothyroidism type - Thyroid Panel With TSH  3. GAD (generalized anxiety disorder) - citalopram (CELEXA) 20 MG tablet; Take 1 tablet (20 mg total) by mouth daily.  Dispense: 90 tablet; Refill: 3 - ALPRAZolam (XANAX) 0.5 MG tablet; TAKE 1 TABLET 2 TIMES A DAY AS NEEDED FO SLEEP OR ANXIETY  Dispense: 60 tablet; Refill: 1  4. Hyperlipidemia - Lipid panel - atorvastatin (LIPITOR) 20 MG tablet; TAKE 1 TABLET DAILY  Dispense: 90 tablet; Refill: 2  5. Depressed - citalopram (CELEXA) 20 MG tablet; Take 1 tablet (20 mg total) by mouth daily.  Dispense: 90 tablet; Refill: 3  6. PUD (peptic ulcer disease) - omeprazole (PRILOSEC) 40 MG capsule; TAKE (1) CAPSULE DAILY  Dispense: 90 capsule; Refill: 3  7. Low back pain without sciatica, unspecified back pain laterality - traMADol (ULTRAM) 50 MG tablet; Take 1 tablet (50 mg total) by mouth every 6 (six) hours as needed.  Dispense: 50 tablet; Refill: 0  8. Encounter for vitamin deficiency screening - Vit D  25 hydroxy (rtn  osteoporosis monitoring)  9. Peripheral neuropathy - gabapentin (NEURONTIN) 300 MG capsule; Take 1 capsule (300 mg total) by mouth 3 (three) times daily.  Dispense: 90 capsule; Refill: 3   Continue all meds Labs pending Health Maintenance reviewed Diet and exercise encouraged RTO 3 months  [name], FNP

## 2013-09-05 NOTE — Patient Instructions (Signed)
Health Maintenance, Female A healthy lifestyle and preventative care can promote health and wellness.  Maintain regular health, dental, and eye exams.  Eat a healthy diet. Foods like vegetables, fruits, whole grains, low-fat dairy products, and lean protein foods contain the nutrients you need without too many calories. Decrease your intake of foods high in solid fats, added sugars, and salt. Get information about a proper diet from your caregiver, if necessary.  Regular physical exercise is one of the most important things you can do for your health. Most adults should get at least 150 minutes of moderate-intensity exercise (any activity that increases your heart rate and causes you to sweat) each week. In addition, most adults need muscle-strengthening exercises on 2 or more days a week.   Maintain a healthy weight. The body mass index (BMI) is a screening tool to identify possible weight problems. It provides an estimate of body fat based on height and weight. Your caregiver can help determine your BMI, and can help you achieve or maintain a healthy weight. For adults 20 years and older:  A BMI below 18.5 is considered underweight.  A BMI of 18.5 to 24.9 is normal.  A BMI of 25 to 29.9 is considered overweight.  A BMI of 30 and above is considered obese.  Maintain normal blood lipids and cholesterol by exercising and minimizing your intake of saturated fat. Eat a balanced diet with plenty of fruits and vegetables. Blood tests for lipids and cholesterol should begin at age 41 and be repeated every 5 years. If your lipid or cholesterol levels are high, you are over 50, or you are a high risk for heart disease, you may need your cholesterol levels checked more frequently.Ongoing high lipid and cholesterol levels should be treated with medicines if diet and exercise are not effective.  If you smoke, find out from your caregiver how to quit. If you do not use tobacco, do not start.  Lung  cancer screening is recommended for adults aged 66-80 years who are at high risk for developing lung cancer because of a history of smoking. Yearly low-dose computed tomography (CT) is recommended for people who have at least a 30-pack-year history of smoking and are a current smoker or have quit within the past 15 years. A pack year of smoking is smoking an average of 1 pack of cigarettes a day for 1 year (for example: 1 pack a day for 30 years or 2 packs a day for 15 years). Yearly screening should continue until the smoker has stopped smoking for at least 15 years. Yearly screening should also be stopped for people who develop a health problem that would prevent them from having lung cancer treatment.  If you are pregnant, do not drink alcohol. If you are breastfeeding, be very cautious about drinking alcohol. If you are not pregnant and choose to drink alcohol, do not exceed 1 drink per day. One drink is considered to be 12 ounces (355 mL) of beer, 5 ounces (148 mL) of wine, or 1.5 ounces (44 mL) of liquor.  Avoid use of street drugs. Do not share needles with anyone. Ask for help if you need support or instructions about stopping the use of drugs.  High blood pressure causes heart disease and increases the risk of stroke. Blood pressure should be checked at least every 1 to 2 years. Ongoing high blood pressure should be treated with medicines, if weight loss and exercise are not effective.  If you are 55 to 72  years old, ask your caregiver if you should take aspirin to prevent strokes.  Diabetes screening involves taking a blood sample to check your fasting blood sugar level. This should be done once every 3 years, after age 45, if you are within normal weight and without risk factors for diabetes. Testing should be considered at a younger age or be carried out more frequently if you are overweight and have at least 1 risk factor for diabetes.  Breast cancer screening is essential preventative care  for women. You should practice "breast self-awareness." This means understanding the normal appearance and feel of your breasts and may include breast self-examination. Any changes detected, no matter how small, should be reported to a caregiver. Women in their 20s and 30s should have a clinical breast exam (CBE) by a caregiver as part of a regular health exam every 1 to 3 years. After age 40, women should have a CBE every year. Starting at age 40, women should consider having a mammogram (breast X-ray) every year. Women who have a family history of breast cancer should talk to their caregiver about genetic screening. Women at a high risk of breast cancer should talk to their caregiver about having an MRI and a mammogram every year.  Breast cancer gene (BRCA)-related cancer risk assessment is recommended for women who have family members with BRCA-related cancers. BRCA-related cancers include breast, ovarian, tubal, and peritoneal cancers. Having family members with these cancers may be associated with an increased risk for harmful changes (mutations) in the breast cancer genes BRCA1 and BRCA2. Results of the assessment will determine the need for genetic counseling and BRCA1 and BRCA2 testing.  The Pap test is a screening test for cervical cancer. Women should have a Pap test starting at age 21. Between ages 21 and 29, Pap tests should be repeated every 2 years. Beginning at age 30, you should have a Pap test every 3 years as long as the past 3 Pap tests have been normal. If you had a hysterectomy for a problem that was not cancer or a condition that could lead to cancer, then you no longer need Pap tests. If you are between ages 65 and 70, and you have had normal Pap tests going back 10 years, you no longer need Pap tests. If you have had past treatment for cervical cancer or a condition that could lead to cancer, you need Pap tests and screening for cancer for at least 20 years after your treatment. If Pap  tests have been discontinued, risk factors (such as a new sexual partner) need to be reassessed to determine if screening should be resumed. Some women have medical problems that increase the chance of getting cervical cancer. In these cases, your caregiver may recommend more frequent screening and Pap tests.  The human papillomavirus (HPV) test is an additional test that may be used for cervical cancer screening. The HPV test looks for the virus that can cause the cell changes on the cervix. The cells collected during the Pap test can be tested for HPV. The HPV test could be used to screen women aged 30 years and older, and should be used in women of any age who have unclear Pap test results. After the age of 30, women should have HPV testing at the same frequency as a Pap test.  Colorectal cancer can be detected and often prevented. Most routine colorectal cancer screening begins at the age of 50 and continues through age 75. However, your caregiver may   recommend screening at an earlier age if you have risk factors for colon cancer. On a yearly basis, your caregiver may provide home test kits to check for hidden blood in the stool. Use of a small camera at the end of a tube, to directly examine the colon (sigmoidoscopy or colonoscopy), can detect the earliest forms of colorectal cancer. Talk to your caregiver about this at age 64, when routine screening begins. Direct examination of the colon should be repeated every 5 to 10 years through age 85, unless early forms of pre-cancerous polyps or small growths are found.  Hepatitis C blood testing is recommended for all people born from [birth year] through 1964 and any individual with known risks for hepatitis C.  Practice safe sex. Use condoms and avoid high-risk sexual practices to reduce the spread of sexually transmitted infections (STIs). Sexually active women aged 30 and younger should be checked for Chlamydia, which is a common sexually transmitted infection.  Older women with new or multiple partners should also be tested for Chlamydia. Testing for other STIs is recommended if you are sexually active and at increased risk.  Osteoporosis is a disease in which the bones lose minerals and strength with aging. This can result in serious bone fractures. The risk of osteoporosis can be identified using a bone density scan. Women ages 44 and over and women at risk for fractures or osteoporosis should discuss screening with their caregivers. Ask your caregiver whether you should be taking a calcium supplement or vitamin D to reduce the rate of osteoporosis.  Menopause can be associated with physical symptoms and risks. Hormone replacement therapy is available to decrease symptoms and risks. You should talk to your caregiver about whether hormone replacement therapy is right for you.  Use sunscreen. Apply sunscreen liberally and repeatedly throughout the day. You should seek shade when your shadow is shorter than you. Protect yourself by wearing long sleeves, pants, a wide-brimmed hat, and sunglasses year round, whenever you are outdoors.  Notify your caregiver of new moles or changes in moles, especially if there is a change in shape or color. Also notify your caregiver if a mole is larger than the size of a pencil eraser.  Stay current with your immunizations. Document Released: 08/25/2010 Document Revised: 06/06/2012 Document Reviewed: 01/11/2013 [facility] Patient Information 2015 [place], [place]. This information is not intended to replace advice given to you by your health care provider. Make sure you discuss any questions you have with your health care provider. Insomnia Insomnia is frequent trouble falling and/or staying asleep. Insomnia can be a long term problem or a short term problem. Both are common. Insomnia can be a short term problem when the wakefulness is related to a certain stress or worry. Long term insomnia is often related to ongoing stress  during waking hours and/or poor sleeping habits. Overtime, sleep deprivation itself can make the problem worse. Every little thing feels more severe because you are overtired and your ability to cope is decreased. CAUSES   Stress, anxiety, and depression.  Poor sleeping habits.  Distractions such as TV in the bedroom.  Naps close to bedtime.  Engaging in emotionally charged conversations before bed.  Technical reading before sleep.  Alcohol and other sedatives. They may make the problem worse. They can hurt normal sleep patterns and normal dream activity.  Stimulants such as caffeine for several hours prior to bedtime.  Pain syndromes and shortness of breath can cause insomnia.  Exercise late at night.  Changing time zones  may cause sleeping problems (jet lag). It is sometimes helpful to have someone observe your sleeping patterns. They should look for periods of not breathing during the night (sleep apnea). They should also look to see how long those periods last. If you live alone or observers are uncertain, you can also be observed at a sleep clinic where your sleep patterns will be professionally monitored. Sleep apnea requires a checkup and treatment. Give your caregivers your medical history. Give your caregivers observations your family has made about your sleep.  SYMPTOMS   Not feeling rested in the morning.  Anxiety and restlessness at bedtime.  Difficulty falling and staying asleep. TREATMENT   Your caregiver may prescribe treatment for an underlying medical disorders. Your caregiver can give advice or help if you are using alcohol or other drugs for self-medication. Treatment of underlying problems will usually eliminate insomnia problems.  Medications can be prescribed for short time use. They are generally not recommended for lengthy use.  Over-the-counter sleep medicines are not recommended for lengthy use. They can be habit forming.  You can promote easier  sleeping by making lifestyle changes such as:  Using relaxation techniques that help with breathing and reduce muscle tension.  Exercising earlier in the day.  Changing your diet and the time of your last meal. No night time snacks.  Establish a regular time to go to bed.  Counseling can help with stressful problems and worry.  Soothing music and white noise may be helpful if there are background noises you cannot remove.  Stop tedious detailed work at least one hour before bedtime. HOME CARE INSTRUCTIONS   Keep a diary. Inform your caregiver about your progress. This includes any medication side effects. See your caregiver regularly. Take note of:  Times when you are asleep.  Times when you are awake during the night.  The quality of your sleep.  How you feel the next day. This information will help your caregiver care for you.  Get out of bed if you are still awake after 15 minutes. Read or do some quiet activity. Keep the lights down. Wait until you feel sleepy and go back to bed.  Keep regular sleeping and waking hours. Avoid naps.  Exercise regularly.  Avoid distractions at bedtime. Distractions include watching television or engaging in any intense or detailed activity like attempting to balance the household checkbook.  Develop a bedtime ritual. Keep a familiar routine of bathing, brushing your teeth, climbing into bed at the same time each night, listening to soothing music. Routines increase the success of falling to sleep faster.  Use relaxation techniques. This can be using breathing and muscle tension release routines. It can also include visualizing peaceful scenes. You can also help control troubling or intruding thoughts by keeping your mind occupied with boring or repetitive thoughts like the old concept of counting sheep. You can make it more creative like imagining planting one beautiful flower after another in your backyard garden.  During your day, work to  eliminate stress. When this is not possible use some of the previous suggestions to help reduce the anxiety that accompanies stressful situations. MAKE SURE YOU:   Understand these instructions.  Will watch your condition.  Will get help right away if you are not doing well or get worse. Document Released: 02/07/2000 Document Revised: 05/04/2011 Document Reviewed: 03/09/2007 [facility] Patient Information 2015 [place], [place]. This information is not intended to replace advice given to you by your health care provider. Make sure you  discuss any questions you have with your health care provider.  

## 2013-09-06 ENCOUNTER — Other Ambulatory Visit: Payer: Self-pay | Admitting: Family

## 2013-09-06 LAB — LIPID PANEL
Chol/HDL Ratio: 3.2 ratio units (ref 0.0–4.4)
Cholesterol, Total: 151 mg/dL (ref 100–199)
HDL: 47 mg/dL (ref >39)
LDL Calculated: 65 mg/dL (ref 0–99)
Triglycerides: 193 mg/dL — ABNORMAL HIGH (ref 0–149)
VLDL Cholesterol Cal: 39 mg/dL (ref 5–40)

## 2013-09-06 LAB — CMP14+EGFR
ALT: 27 IU/L (ref 0–32)
AST: 26 IU/L (ref 0–40)
Albumin/Globulin Ratio: 1.9 (ref 1.1–2.5)
Albumin: 4.8 g/dL (ref 3.5–4.8)
Alkaline Phosphatase: 58 IU/L (ref 39–117)
BUN/Creatinine Ratio: 17 (ref 11–26)
BUN: 13 mg/dL (ref 8–27)
CO2: 23 mmol/L (ref 18–29)
Calcium: 10.5 mg/dL — ABNORMAL HIGH (ref 8.7–10.3)
Chloride: 98 mmol/L (ref 97–108)
Creatinine, Ser: 0.78 mg/dL (ref 0.57–1.00)
GFR calc Af Amer: 88 mL/min/{1.73_m2} (ref >59)
GFR calc non Af Amer: 76 mL/min/{1.73_m2} (ref >59)
Globulin, Total: 2.5 g/dL (ref 1.5–4.5)
Glucose: 109 mg/dL — ABNORMAL HIGH (ref 65–99)
Potassium: 4.2 mmol/L (ref 3.5–5.2)
Sodium: 140 mmol/L (ref 134–144)
Total Bilirubin: 0.9 mg/dL (ref 0.0–1.2)
Total Protein: 7.3 g/dL (ref 6.0–8.5)

## 2013-09-06 LAB — THYROID PANEL WITH TSH
Free Thyroxine Index: 2.3 (ref 1.2–4.9)
T3 Uptake Ratio: 27 % (ref 24–39)
T4, Total: 8.6 ug/dL (ref 4.5–12.0)
TSH: 2.24 u[IU]/mL (ref 0.450–4.500)

## 2013-09-06 LAB — VITAMIN D 25 HYDROXY (VIT D DEFICIENCY, FRACTURES): Vit D, 25-Hydroxy: 20.5 ng/mL — ABNORMAL LOW (ref 30.0–100.0)

## 2013-09-06 MED ORDER — ATORVASTATIN CALCIUM 40 MG PO TABS: 40.0000 mg | ORAL_TABLET | Freq: Every day | ORAL | Status: DC

## 2013-09-06 MED ORDER — VITAMIN D (ERGOCALCIFEROL) 1.25 MG (50000 UNIT) PO CAPS: 50000.0000 [IU] | ORAL_CAPSULE | ORAL | Status: DC

## 2013-09-07 ENCOUNTER — Telehealth: Payer: Self-pay | Admitting: Family Medicine

## 2013-09-07 NOTE — Telephone Encounter (Signed)
Patient aware.

## 2013-09-07 NOTE — Telephone Encounter (Signed)
Message copied by Waverly Ferrari on Thu Sep 07, 2013  9:55 AM ------      Message from: Lenna Gilford, [place] A      Created: Wed Sep 06, 2013 12:29 PM       Kidney and liver function stable      Cholesterol levels WNL- Triglycerides high- Need to be on low fat diet, rx increased      Thyroid levels WNL      Vit D levels low-RX sent to pharmacy       ------

## 2013-09-08 ENCOUNTER — Telehealth: Payer: Self-pay | Admitting: Family

## 2013-09-08 NOTE — Telephone Encounter (Signed)
Patient aware.

## 2013-09-12 ENCOUNTER — Encounter: Payer: Self-pay | Admitting: Family

## 2013-10-02 ENCOUNTER — Other Ambulatory Visit: Payer: Self-pay | Admitting: Family

## 2013-10-03 ENCOUNTER — Telehealth: Payer: Self-pay | Admitting: *Deleted

## 2013-10-03 NOTE — Telephone Encounter (Signed)
LM, script for tramadol ready. 

## 2013-10-03 NOTE — Telephone Encounter (Signed)
Patient last seen in office on 09-05-13. Rx last filled on 09-05-13 for #50. Please advise. If approved please print and route to Pool A so nurse can call patient to pick up

## 2013-10-14 ENCOUNTER — Other Ambulatory Visit: Payer: Self-pay | Admitting: Family

## 2013-10-16 NOTE — Telephone Encounter (Signed)
Last ov 7/15/ Last refill 10/03/13. If approved print and route to pool. See allergies.

## 2013-10-17 NOTE — Telephone Encounter (Signed)
Pt aware to pickup rX

## 2013-10-28 ENCOUNTER — Other Ambulatory Visit: Payer: Self-pay | Admitting: General Practice

## 2013-10-28 ENCOUNTER — Other Ambulatory Visit: Payer: Self-pay | Admitting: Family

## 2013-10-30 NOTE — Telephone Encounter (Signed)
Last filled 10/02/13, last seen 09/08/13. Route to pool A, nurse call into [facility]

## 2013-10-31 NOTE — Telephone Encounter (Signed)
rx called into pharmacy

## 2013-11-06 ENCOUNTER — Other Ambulatory Visit: Payer: Self-pay | Admitting: Family

## 2013-11-07 ENCOUNTER — Telehealth: Payer: Self-pay | Admitting: *Deleted

## 2013-11-07 NOTE — Telephone Encounter (Signed)
LM, tramadol script ready.

## 2013-11-07 NOTE — Telephone Encounter (Signed)
Last filled 10/03/13, last seen 09/07/13.rx will print, have nurse call her to pickup

## 2013-11-07 NOTE — Telephone Encounter (Signed)
Pt called and she said she already had RX. Christy aware and wants me to  shred RX for today. Rx place in shredder witnessed by [employer].

## 2013-11-30 ENCOUNTER — Other Ambulatory Visit: Payer: Self-pay | Admitting: Family

## 2013-12-02 NOTE — Telephone Encounter (Signed)
Patient last seen in office on 09-05-13. Rx last filled on 11-01-13 for #60. Please advise. If approved please route to Pool A so nurse can phone in to pharmacy

## 2013-12-05 NOTE — Telephone Encounter (Signed)
Called in.

## 2013-12-06 ENCOUNTER — Other Ambulatory Visit: Payer: Self-pay | Admitting: Pharmacist

## 2013-12-06 MED ORDER — DENOSUMAB 60 MG/ML ~~LOC~~ SOLN: 60.0000 mg | SUBCUTANEOUS | Status: DC

## 2013-12-07 ENCOUNTER — Ambulatory Visit: Payer: Medicare Other | Admitting: Family Medicine

## 2013-12-07 ENCOUNTER — Other Ambulatory Visit: Payer: Self-pay | Admitting: Family

## 2013-12-08 ENCOUNTER — Telehealth: Payer: Self-pay | Admitting: Family

## 2013-12-08 NOTE — Telephone Encounter (Signed)
Patient has appt with Stevan Born, NP 12/27/13 - she asked if she could hold off on Prolia until that appt. OK to wait and will administer 12/27/13

## 2013-12-09 NOTE — Telephone Encounter (Signed)
Patient of [name]. Last office visit was 09-05-13. Rx last filled on 11-07-13 for #50. Please advise. If approved please route to Pool A so nurse can call patient to pick up. Rx will print

## 2013-12-10 NOTE — Telephone Encounter (Signed)
This is okay to refill x1

## 2013-12-11 ENCOUNTER — Ambulatory Visit (INDEPENDENT_AMBULATORY_CARE_PROVIDER_SITE_OTHER): Payer: Medicare Other

## 2013-12-11 DIAGNOSIS — Z23 Encounter for immunization: Secondary | ICD-10-CM

## 2013-12-11 NOTE — Telephone Encounter (Signed)
Script available at front desk 

## 2013-12-14 ENCOUNTER — Ambulatory Visit: Payer: Self-pay

## 2013-12-27 ENCOUNTER — Ambulatory Visit (INDEPENDENT_AMBULATORY_CARE_PROVIDER_SITE_OTHER): Payer: Medicare Other | Admitting: Family Medicine

## 2013-12-27 ENCOUNTER — Encounter: Payer: Self-pay | Admitting: Family Medicine

## 2013-12-27 VITALS — BP 124/76 | HR 81 | Temp 96.8°F | Ht 60.0 in | Wt 191.2 lb

## 2013-12-27 DIAGNOSIS — M810 Age-related osteoporosis without current pathological fracture: Secondary | ICD-10-CM

## 2013-12-27 DIAGNOSIS — R21 Rash and other nonspecific skin eruption: Secondary | ICD-10-CM

## 2013-12-27 DIAGNOSIS — E785 Hyperlipidemia, unspecified: Secondary | ICD-10-CM

## 2013-12-27 DIAGNOSIS — M1990 Unspecified osteoarthritis, unspecified site: Secondary | ICD-10-CM

## 2013-12-27 DIAGNOSIS — I10 Essential (primary) hypertension: Secondary | ICD-10-CM

## 2013-12-27 LAB — POCT CBC
Granulocyte percent: 52.6 %G (ref 37–80)
HCT, POC: 39.6 % (ref 37.7–47.9)
Hemoglobin: 13.3 g/dL (ref 12.2–16.2)
Lymph, poc: 3.9 — AB (ref 0.6–3.4)
MCH, POC: 28.8 pg (ref 27–31.2)
MCHC: 33.6 g/dL (ref 31.8–35.4)
MCV: 85.6 fL (ref 80–97)
MPV: 8.2 fL (ref 0–99.8)
POC Granulocyte: 5.3 (ref 2–6.9)
POC LYMPH PERCENT: 39.1 %L (ref 10–50)
Platelet Count, POC: 270 10*3/uL (ref 142–424)
RBC: 4.6 M/uL (ref 4.04–5.48)
RDW, POC: 14.6 %
WBC: 10 10*3/uL (ref 4.6–10.2)

## 2013-12-27 MED ORDER — DICLOFENAC SODIUM 1 % TD GEL: 2.0000 g | Freq: Four times a day (QID) | TRANSDERMAL | Status: DC

## 2013-12-27 MED ORDER — DENOSUMAB 60 MG/ML ~~LOC~~ SOLN
60.0000 mg | Freq: Once | SUBCUTANEOUS | Status: AC
  Administered 2013-12-27: 60 mg via SUBCUTANEOUS

## 2013-12-27 MED ORDER — CLOBETASOL PROPIONATE 0.05 % EX CREA: 1.0000 "application " | TOPICAL_CREAM | Freq: Two times a day (BID) | CUTANEOUS | Status: DC

## 2013-12-27 NOTE — Progress Notes (Addendum)
   Subjective:    Patient ID: Sheila [name], female    DOB: 1940-11-21, 73 y.o.   MRN: [id number]  HPI Patient is here for prolia shot.  She has hx of htn, OA, anxiety, and GERD.  She is due for labs.  She c/o arthralgias in her shoulder, back, and multiple joint sights.  She is unable to take po NSAIDS due to problems with GERD.    Review of Systems  Constitutional: Negative for fever.  HENT: Negative for ear pain.   Eyes: Negative for discharge.  Respiratory: Negative for cough.   Cardiovascular: Negative for chest pain.  Gastrointestinal: Negative for abdominal distention.  Endocrine: Negative for polyuria.  Genitourinary: Negative for difficulty urinating.  Musculoskeletal: Negative for gait problem and neck pain.  Skin: Negative for color change and rash.  Neurological: Negative for speech difficulty and headaches.  Psychiatric/Behavioral: Negative for agitation.       Objective:    BP 124/76 mmHg  Pulse 81  Temp(Src) 96.8 F (36 C) (Oral)  Ht 5' (1.524 m)  Wt 191 lb 3.2 oz (86.728 kg)  BMI 37.34 kg/m2 Physical Exam  Constitutional: She is oriented to person, place, and time. She appears well-developed and well-nourished.  HENT:  Head: Normocephalic and atraumatic.  Mouth/Throat: Oropharynx is clear and moist.  Eyes: Pupils are equal, round, and reactive to light.  Neck: Normal range of motion. Neck supple.  Cardiovascular: Normal rate and regular rhythm.   No murmur heard. Pulmonary/Chest: Effort normal and breath sounds normal.  Abdominal: Soft. Bowel sounds are normal. There is no tenderness.  Neurological: She is alert and oriented to person, place, and time.  Skin: Skin is warm and dry.  Psychiatric: She has a normal mood and affect.          Assessment & Plan:     ICD-9-CM ICD-10-CM   1. Osteoporosis 733.00 M81.0 denosumab (PROLIA) injection 60 mg     POCT CBC  2. Hyperlipemia 272.4 E78.5 Lipid panel  3. Essential hypertension, benign 401.1 I10 POCT  CBC     CMP14+EGFR  4. Arthritis 716.90 M19.90 diclofenac sodium (VOLTAREN) 1 % GEL  5. Rash and nonspecific skin eruption 782.1 R21 clobetasol cream (TEMOVATE) 0.05 %     No Follow-up on file.  Lysbeth Penner FNP   Addendeum:  Patient with osteporosis.  Given prolia in office today by TBE.  Cherre Robins, PharmD, CPP

## 2013-12-28 LAB — CMP14+EGFR
ALT: 24 IU/L (ref 0–32)
AST: 23 IU/L (ref 0–40)
Albumin/Globulin Ratio: 2 (ref 1.1–2.5)
Albumin: 4.7 g/dL (ref 3.5–4.8)
Alkaline Phosphatase: 48 IU/L (ref 39–117)
BUN/Creatinine Ratio: 15 (ref 11–26)
BUN: 13 mg/dL (ref 8–27)
CO2: 25 mmol/L (ref 18–29)
Calcium: 10.7 mg/dL — ABNORMAL HIGH (ref 8.7–10.3)
Chloride: 97 mmol/L (ref 97–108)
Creatinine, Ser: 0.84 mg/dL (ref 0.57–1.00)
GFR calc Af Amer: 80 mL/min/{1.73_m2} (ref >59)
GFR calc non Af Amer: 70 mL/min/{1.73_m2} (ref >59)
Globulin, Total: 2.4 g/dL (ref 1.5–4.5)
Glucose: 101 mg/dL — ABNORMAL HIGH (ref 65–99)
Potassium: 4 mmol/L (ref 3.5–5.2)
Sodium: 140 mmol/L (ref 134–144)
Total Bilirubin: 1 mg/dL (ref 0.0–1.2)
Total Protein: 7.1 g/dL (ref 6.0–8.5)

## 2013-12-28 LAB — LIPID PANEL
Chol/HDL Ratio: 2.8 ratio units (ref 0.0–4.4)
Cholesterol, Total: 132 mg/dL (ref 100–199)
HDL: 48 mg/dL (ref >39)
LDL Calculated: 51 mg/dL (ref 0–99)
Triglycerides: 165 mg/dL — ABNORMAL HIGH (ref 0–149)
VLDL Cholesterol Cal: 33 mg/dL (ref 5–40)

## 2014-01-01 ENCOUNTER — Other Ambulatory Visit: Payer: Self-pay

## 2014-01-01 DIAGNOSIS — G629 Polyneuropathy, unspecified: Secondary | ICD-10-CM

## 2014-01-01 MED ORDER — GABAPENTIN 300 MG PO CAPS: 300.0000 mg | ORAL_CAPSULE | Freq: Three times a day (TID) | ORAL | Status: DC

## 2014-01-10 ENCOUNTER — Other Ambulatory Visit: Payer: Self-pay | Admitting: Family Medicine

## 2014-01-10 NOTE — Telephone Encounter (Signed)
Please review

## 2014-01-10 NOTE — Telephone Encounter (Signed)
Last seen 12/27/13 B [place]  If approved print and route to nurse

## 2014-01-12 NOTE — Telephone Encounter (Signed)
RX for Tramadol called into [facility]

## 2014-01-19 ENCOUNTER — Other Ambulatory Visit: Payer: Self-pay | Admitting: Family Medicine

## 2014-01-30 ENCOUNTER — Other Ambulatory Visit: Payer: Self-pay | Admitting: Family

## 2014-01-31 NOTE — Telephone Encounter (Signed)
Please review and advise.

## 2014-01-31 NOTE — Telephone Encounter (Signed)
Last ov 11/15. Last refill 01/02/14. If approved call to [facility]. Route to nurse pool.

## 2014-02-06 ENCOUNTER — Encounter: Payer: Self-pay | Admitting: Family Medicine

## 2014-02-06 ENCOUNTER — Ambulatory Visit (INDEPENDENT_AMBULATORY_CARE_PROVIDER_SITE_OTHER): Payer: Medicare Other | Admitting: Family Medicine

## 2014-02-06 VITALS — BP 116/67 | HR 84 | Temp 97.0°F | Ht 60.0 in | Wt 198.2 lb

## 2014-02-06 DIAGNOSIS — M1990 Unspecified osteoarthritis, unspecified site: Secondary | ICD-10-CM

## 2014-02-06 MED ORDER — DICLOFENAC SODIUM 1 % TD GEL: 2.0000 g | Freq: Four times a day (QID) | TRANSDERMAL | Status: DC

## 2014-02-06 NOTE — Progress Notes (Signed)
   Subjective:    Patient ID: Sheila [name], female    DOB: Jul 28, 1940, 73 y.o.   MRN: [id number]  HPI Patient is here for c/o difficulty doing ADL's and is requesting a personal aid.  She needs a prescription for a ramp up her steps at home so she can get into her house.  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  She is needing refills on her arthritis cream.    Review of Systems  Constitutional: Negative for fever.  HENT: Negative for ear pain.   Eyes: Negative for discharge.  Respiratory: Negative for cough.   Cardiovascular: Negative for chest pain.  Gastrointestinal: Negative for abdominal distention.  Endocrine: Negative for polyuria.  Genitourinary: Negative for difficulty urinating.  Musculoskeletal: Negative for gait problem and neck pain.  Skin: Negative for color change and rash.  Neurological: Negative for speech difficulty and headaches.  Psychiatric/Behavioral: Negative for agitation.       Objective:    BP 116/67 mmHg  Pulse 84  Temp(Src) 97 F (36.1 C) (Oral)  Ht 5' (1.524 m)  Wt 198 lb 3.2 oz (89.903 kg)  BMI 38.71 kg/m2 Physical Exam  Constitutional: She is oriented to person, place, and time.  Chronically ill appearing female with cane  HENT:  Head: Normocephalic and atraumatic.  Mouth/Throat: Oropharynx is clear and moist.  Eyes: Pupils are equal, round, and reactive to light.  Neck: Normal range of motion. Neck supple.  Cardiovascular: Normal rate and regular rhythm.   No murmur heard. Pulmonary/Chest: Effort normal and breath sounds normal.  Abdominal: Soft. Bowel sounds are normal. There is no tenderness.  Musculoskeletal:  Decreased ROM LS spine, bilateral knees. Shuffling gait using cane to ambulate  Neurological: She is alert and oriented to person, place, and time.  Skin: Skin is warm and dry.  Psychiatric: She has a  normal mood and affect.          Assessment & Plan:     ICD-9-CM ICD-10-CM   1. Arthritis 716.90 M19.90 diclofenac sodium (VOLTAREN) 1 % GEL   Decreased Mobility - Recommend Home Aide  No Follow-up on file.  Lysbeth Penner FNP

## 2014-02-13 ENCOUNTER — Other Ambulatory Visit: Payer: Self-pay | Admitting: Family Medicine

## 2014-02-13 NOTE — Telephone Encounter (Signed)
Last seen 02/06/14 B [place]  If approved print and route to nurse

## 2014-02-13 NOTE — Telephone Encounter (Signed)
Ultram rx ready for pick up  

## 2014-02-14 ENCOUNTER — Telehealth: Payer: Self-pay | Admitting: *Deleted

## 2014-02-14 NOTE — Telephone Encounter (Signed)
Left message, script for pain medication ready.

## 2014-03-01 ENCOUNTER — Other Ambulatory Visit: Payer: Self-pay | Admitting: Family

## 2014-03-03 ENCOUNTER — Other Ambulatory Visit: Payer: Self-pay | Admitting: Family Medicine

## 2014-03-03 ENCOUNTER — Other Ambulatory Visit: Payer: Self-pay | Admitting: General Practice

## 2014-03-03 ENCOUNTER — Other Ambulatory Visit: Payer: Self-pay | Admitting: Family

## 2014-03-05 ENCOUNTER — Telehealth: Payer: Self-pay | Admitting: Family Medicine

## 2014-03-05 DIAGNOSIS — L989 Disorder of the skin and subcutaneous tissue, unspecified: Secondary | ICD-10-CM | POA: Diagnosis not present

## 2014-03-05 DIAGNOSIS — N905 Atrophy of vulva: Secondary | ICD-10-CM | POA: Diagnosis not present

## 2014-03-05 NOTE — Telephone Encounter (Signed)
Last filled 01/31/14, last seen 02/06/14. Call into [facility]

## 2014-03-05 NOTE — Telephone Encounter (Signed)
Refill called to [facility]

## 2014-03-06 ENCOUNTER — Ambulatory Visit (INDEPENDENT_AMBULATORY_CARE_PROVIDER_SITE_OTHER): Payer: 59

## 2014-03-06 ENCOUNTER — Ambulatory Visit (INDEPENDENT_AMBULATORY_CARE_PROVIDER_SITE_OTHER): Payer: 59 | Admitting: Nurse Practitioner

## 2014-03-06 ENCOUNTER — Encounter: Payer: Self-pay | Admitting: Nurse Practitioner

## 2014-03-06 VITALS — BP 124/76 | HR 65 | Temp 97.7°F | Ht 60.0 in | Wt 198.0 lb

## 2014-03-06 DIAGNOSIS — W19XXXA Unspecified fall, initial encounter: Secondary | ICD-10-CM

## 2014-03-06 DIAGNOSIS — M25561 Pain in right knee: Secondary | ICD-10-CM

## 2014-03-06 DIAGNOSIS — S8002XA Contusion of left knee, initial encounter: Secondary | ICD-10-CM | POA: Diagnosis not present

## 2014-03-06 DIAGNOSIS — M25562 Pain in left knee: Secondary | ICD-10-CM

## 2014-03-06 NOTE — Progress Notes (Signed)
   Subjective:    Patient ID: Sheila [name], female    DOB: 05/28/1940, 74 y.o.   MRN: [id number] painful to walk but no more   HPI Patient in with caregiver c/o falling yesterday while trying to get out of the car - she landed on left knee and pelvis- painful to walk- edema and echymosis of left knee- She does have cement in her left pelvic area. Painful to walk but no more then her usual pain.    Review of Systems  Constitutional: Negative.   HENT: Negative.   Respiratory: Negative.   Cardiovascular: Negative.   Genitourinary: Negative.   Psychiatric/Behavioral: Negative.   All other systems reviewed and are negative.      Objective:   Physical Exam  Constitutional: She is oriented to person, place, and time. She appears well-developed and well-nourished. No distress.  Cardiovascular: Normal rate, regular rhythm and normal heart sounds.   Pulmonary/Chest: Effort normal and breath sounds normal.  Musculoskeletal:  Gait slow and steady with cane for support- No left knee effusion- FROM with slight crepitus FROM of bil hips without  Pain   Neurological: She is alert and oriented to person, place, and time. She has normal reflexes.  Skin: Skin is warm and dry.  Psychiatric: She has a normal mood and affect. Her behavior is normal. Judgment and thought content normal.     BP 124/76 mmHg  Pulse 65  Temp(Src) 97.7 F (36.5 C) (Oral)  Ht 5' (1.524 m)  Wt 198 lb (89.812 kg)  BMI 38.67 kg/m2  Pelvic xray- no fracture- cemented area looks ok-Preliminary reading by Ronnald Collum, FNP  [facility] Left knee xray- no fracture- prosthesis intact-Preliminary reading by Ronnald Collum, FNP  [facility]      Assessment & Plan:  1. Left knee pain  - DG Knee 1-2 Views Right; Future - DG Knee Bilateral Standing [facility]; Future - DG Pelvis 1-2 Views; Future  2. Knee contusion, left, initial encounter Ice Elevate when sitting If continues to hurt needs to see ortho  Mary-Margaret Hassell Done, FNP

## 2014-03-06 NOTE — Patient Instructions (Signed)
Contusion °A contusion is a deep bruise. Contusions are the result of an injury that caused bleeding under the skin. The contusion may turn blue, purple, or yellow. Minor injuries will give you a painless contusion, but more severe contusions may stay painful and swollen for a few weeks.  °CAUSES  °A contusion is usually caused by a blow, trauma, or direct force to an area of the body. °SYMPTOMS  °· Swelling and redness of the injured area. °· Bruising of the injured area. °· Tenderness and soreness of the injured area. °· Pain. °DIAGNOSIS  °The diagnosis can be made by taking a history and physical exam. An X-ray, CT scan, or MRI may be needed to determine if there were any associated injuries, such as fractures. °TREATMENT  °Specific treatment will depend on what area of the body was injured. In general, the best treatment for a contusion is resting, icing, elevating, and applying cold compresses to the injured area. Over-the-counter medicines may also be recommended for pain control. Ask your caregiver what the best treatment is for your contusion. °HOME CARE INSTRUCTIONS  °· Put ice on the injured area. °¨ Put ice in a plastic bag. °¨ Place a towel between your skin and the bag. °¨ Leave the ice on for 15-20 minutes, 3-4 times a day, or as directed by your health care provider. °· Only take over-the-counter or prescription medicines for pain, discomfort, or fever as directed by your caregiver. Your caregiver may recommend avoiding anti-inflammatory medicines (aspirin, ibuprofen, and naproxen) for 48 hours because these medicines may increase bruising. °· Rest the injured area. °· If possible, elevate the injured area to reduce swelling. °SEEK IMMEDIATE MEDICAL CARE IF:  °· You have increased bruising or swelling. °· You have pain that is getting worse. °· Your swelling or pain is not relieved with medicines. °MAKE SURE YOU:  °· Understand these instructions. °· Will watch your condition. °· Will get help right  away if you are not doing well or get worse. °Document Released: 11/19/2004 Document Revised: 02/14/2013 Document Reviewed: 12/15/2010 °ExitCare® Patient Information ©2015 Exit[employer] LLC. This information is not intended to replace advice given to you by your health care provider. Make sure you discuss any questions you have with your health care provider. ° °

## 2014-03-06 NOTE — Telephone Encounter (Signed)
Appointment given for 5:00pm today with Ronnald Collum, FNP

## 2014-03-24 ENCOUNTER — Other Ambulatory Visit: Payer: Self-pay | Admitting: Family Medicine

## 2014-04-09 ENCOUNTER — Other Ambulatory Visit: Payer: Self-pay | Admitting: Nurse Practitioner

## 2014-04-10 NOTE — Telephone Encounter (Signed)
Ultram rx ready for pick up  

## 2014-04-10 NOTE — Telephone Encounter (Signed)
Last seen 03/06/14 MMM  If approved print and route to nurse

## 2014-04-11 DIAGNOSIS — N905 Atrophy of vulva: Secondary | ICD-10-CM | POA: Diagnosis not present

## 2014-04-11 DIAGNOSIS — L989 Disorder of the skin and subcutaneous tissue, unspecified: Secondary | ICD-10-CM | POA: Diagnosis not present

## 2014-04-11 DIAGNOSIS — L290 Pruritus ani: Secondary | ICD-10-CM | POA: Diagnosis not present

## 2014-04-11 NOTE — Telephone Encounter (Signed)
Rx up front and patient notified

## 2014-04-30 ENCOUNTER — Other Ambulatory Visit: Payer: Self-pay | Admitting: Family Medicine

## 2014-05-02 NOTE — Telephone Encounter (Signed)
Please call in ativan with 1 refills 

## 2014-05-02 NOTE — Telephone Encounter (Signed)
RX for Xanax called into [facility] per MMM

## 2014-05-02 NOTE — Telephone Encounter (Signed)
Last seen 03/06/14 MMM  If approved route to nurse to call into [facility]

## 2014-05-03 ENCOUNTER — Other Ambulatory Visit: Payer: Self-pay | Admitting: Family Medicine

## 2014-05-08 ENCOUNTER — Other Ambulatory Visit: Payer: Self-pay | Admitting: Nurse Practitioner

## 2014-05-08 NOTE — Telephone Encounter (Signed)
Pt notified RX is ready for pick up 

## 2014-05-08 NOTE — Telephone Encounter (Signed)
Please advise on refill.  Last seen 03/06/14, has follow up appointment 05/11/14.

## 2014-05-08 NOTE — Telephone Encounter (Signed)
.  ultram rx ready for pick up  

## 2014-05-11 ENCOUNTER — Ambulatory Visit (INDEPENDENT_AMBULATORY_CARE_PROVIDER_SITE_OTHER): Payer: Medicare Other | Admitting: Nurse Practitioner

## 2014-05-11 ENCOUNTER — Encounter: Payer: Self-pay | Admitting: Nurse Practitioner

## 2014-05-11 VITALS — BP 103/66 | HR 73 | Temp 97.4°F | Ht 60.0 in | Wt 192.0 lb

## 2014-05-11 DIAGNOSIS — F329 Major depressive disorder, single episode, unspecified: Secondary | ICD-10-CM

## 2014-05-11 DIAGNOSIS — E785 Hyperlipidemia, unspecified: Secondary | ICD-10-CM | POA: Diagnosis not present

## 2014-05-11 DIAGNOSIS — I10 Essential (primary) hypertension: Secondary | ICD-10-CM | POA: Diagnosis not present

## 2014-05-11 DIAGNOSIS — E039 Hypothyroidism, unspecified: Secondary | ICD-10-CM

## 2014-05-11 DIAGNOSIS — F411 Generalized anxiety disorder: Secondary | ICD-10-CM | POA: Diagnosis not present

## 2014-05-11 DIAGNOSIS — F32A Depression, unspecified: Secondary | ICD-10-CM

## 2014-05-11 DIAGNOSIS — G629 Polyneuropathy, unspecified: Secondary | ICD-10-CM

## 2014-05-11 DIAGNOSIS — E876 Hypokalemia: Secondary | ICD-10-CM | POA: Diagnosis not present

## 2014-05-11 DIAGNOSIS — K279 Peptic ulcer, site unspecified, unspecified as acute or chronic, without hemorrhage or perforation: Secondary | ICD-10-CM

## 2014-05-11 MED ORDER — ATORVASTATIN CALCIUM 40 MG PO TABS: 40.0000 mg | ORAL_TABLET | Freq: Every day | ORAL | Status: DC

## 2014-05-11 MED ORDER — OMEPRAZOLE 40 MG PO CPDR: DELAYED_RELEASE_CAPSULE | ORAL | Status: DC

## 2014-05-11 MED ORDER — POTASSIUM CHLORIDE CRYS ER 20 MEQ PO TBCR: EXTENDED_RELEASE_TABLET | ORAL | Status: DC

## 2014-05-11 MED ORDER — GABAPENTIN 300 MG PO CAPS: ORAL_CAPSULE | ORAL | Status: DC

## 2014-05-11 NOTE — Patient Instructions (Signed)
Bone Health Our bones do many things. They provide structure, protect organs, anchor muscles, and store calcium. Adequate calcium in your diet and weight-bearing physical activity help build strong bones, improve bone amounts, and may reduce the risk of weakening of bones (osteoporosis) later in life. PEAK BONE MASS By age 73, the average woman has acquired most of her skeletal bone mass. A large decline occurs in older adults which increases the risk of osteoporosis. In women this occurs around the time of menopause. It is important for young girls to reach their peak bone mass in order to maintain bone health throughout life. A person with high bone mass as a young adult will be more likely to have a higher bone mass later in life. Not enough calcium consumption and physical activity early on could result in a failure to achieve optimum bone mass in adulthood. OSTEOPOROSIS Osteoporosis is a disease of the bones. It is defined as low bone mass with deterioration of bone structure. Osteoporosis leads to an increase risk of fractures with falls. These fractures commonly happen in the wrist, hip, and spine. While men and women of all ages and background can develop osteoporosis, some of the risk factors for osteoporosis are:  Female.  White.  Postmenopausal.  Older adults.  Small in body size.  Eating a diet low in calcium.  Physically inactive.  Smoking.  Use of some medications.  Family history. CALCIUM Calcium is a mineral needed by the body for healthy bones, teeth, and proper function of the heart, muscles, and nerves. The body cannot produce calcium so it must be absorbed through food. Good sources of calcium include:  Dairy products (low fat or nonfat milk, cheese, and yogurt).  Dark green leafy vegetables (bok choy and broccoli).  Calcium fortified foods (orange juice, cereal, bread, soy beverages, and tofu products).  Nuts (almonds). Recommended amounts of calcium vary  for individuals. RECOMMENDED CALCIUM INTAKES Age and Amount in mg per day  Children 1 to 3 years / 700 mg  Children 4 to 8 years / 1,000 mg  Children 9 to 13 years / 1,300 mg  Teens 14 to 18 years / 1,300 mg  Adults 19 to 50 years / 1,000 mg  Adult women 51 to 70 years / 1,200 mg  Adults 71 years and older / 1,200 mg  Pregnant and breastfeeding teens / 1,300 mg  Pregnant and breastfeeding adults / 1,000 mg Vitamin D also plays an important role in healthy bone development. Vitamin D helps in the absorption of calcium. WEIGHT-BEARING PHYSICAL ACTIVITY Regular physical activity has many positive health benefits. Benefits include strong bones. Weight-bearing physical activity early in life is important in reaching peak bone mass. Weight-bearing physical activities cause muscles and bones to work against gravity. Some examples of weight bearing physical activities include:  Walking, jogging, or running.  Field Hockey.  Jumping rope.  Dancing.  Soccer.  Tennis or Racquetball.  Stair climbing.  Basketball.  Hiking.  Weight lifting.  Aerobic fitness classes. Including weight-bearing physical activity into an exercise plan is a great way to keep bones healthy. Adults: Engage in at least 30 minutes of moderate physical activity on most, preferably all, days of the week. Children: Engage in at least 60 minutes of moderate physical activity on most, preferably all, days of the week. FOR MORE INFORMATION [place] Department of Agriculture, Center for Nutrition Policy and Promotion: www.cnpp.usda.gov National Osteoporosis Foundation: www.nof.org Document Released: 05/02/2003 Document Revised: 06/06/2012 Document Reviewed: 08/01/2008 ExitCare Patient Information   2015 ExitCare, LLC. This information is not intended to replace advice given to you by your health care provider. Make sure you discuss any questions you have with your health care provider.  

## 2014-05-11 NOTE — Progress Notes (Signed)
Subjective:    Patient ID: Sheila [name], female    DOB: Apr 08, 1940, 74 y.o.   MRN: [id number]   Patient here today for follow up of chronic medical problems.   Hypertension This is a chronic problem. The current episode started more than 1 year ago. The problem is unchanged. The problem is controlled. Associated symptoms include anxiety. Pertinent negatives include no chest pain, headaches, palpitations or shortness of breath. Risk factors for coronary artery disease include dyslipidemia, post-menopausal state, obesity and sedentary lifestyle. Past treatments include beta blockers. The current treatment provides moderate improvement. Compliance problems include diet and exercise.  Hypertensive end-organ damage includes CAD/MI and a thyroid problem. There is no history of CVA or PVD.  Hyperlipidemia This is a chronic problem. The current episode started more than 1 year ago. The problem is uncontrolled. Recent lipid tests were reviewed and are variable. Pertinent negatives include no chest pain, myalgias or shortness of breath. Current antihyperlipidemic treatment includes statins. The current treatment provides moderate improvement of lipids. Compliance problems include adherence to diet and adherence to exercise.  Risk factors for coronary artery disease include dyslipidemia, hypertension and post-menopausal.  Gastrophageal Reflux She complains of abdominal pain (slight last severla days). She reports no chest pain, no choking or no sore throat. This is a recurrent problem. The problem occurs occasionally. Nothing aggravates the symptoms. Pertinent negatives include no fatigue, melena or orthopnea. There are no known risk factors. She has tried nothing for the symptoms. The treatment provided moderate relief.  Thyroid Problem Visit type: hypothyroidism. Symptoms include anxiety. Patient reports no constipation, diarrhea, fatigue, palpitations or visual change. The symptoms have been stable. Her past  medical history is significant for hyperlipidemia.  Anxiety Symptoms include nervous/anxious behavior. Patient reports no chest pain, palpitations or shortness of breath.     * Her only complaint is right hip pain for over a year- she has been using voltaren gel which helps some.   Review of Systems  Constitutional: Negative.  Negative for fatigue.  HENT: Negative.  Negative for sore throat.   Eyes: Negative.   Respiratory: Negative.  Negative for choking and shortness of breath.   Cardiovascular: Negative.  Negative for chest pain and palpitations.  Gastrointestinal: Positive for abdominal pain (slight last severla days). Negative for diarrhea, constipation and melena.  Endocrine: Negative.   Genitourinary: Negative.   Musculoskeletal: Negative.  Negative for myalgias.  Neurological: Negative.  Negative for headaches.  Hematological: Negative.   Psychiatric/Behavioral: The patient is nervous/anxious.   All other systems reviewed and are negative.      Objective:   Physical Exam  Constitutional: She is oriented to person, place, and time. She appears well-developed and well-nourished. No distress.  HENT:  Head: Normocephalic and atraumatic.  Right Ear: External ear normal.  Mouth/Throat: Oropharynx is clear and moist.  Eyes: Pupils are equal, round, and reactive to light.  Neck: Normal range of motion. Neck supple. No thyromegaly present.  Cardiovascular: Normal rate, regular rhythm, normal heart sounds and intact distal pulses.   No murmur heard. Pulmonary/Chest: Effort normal and breath sounds normal. No respiratory distress. She has no wheezes.  Abdominal: Soft. Bowel sounds are normal. She exhibits no distension. There is no tenderness.  Musculoskeletal: Normal range of motion. She exhibits no edema or tenderness.  Neurological: She is alert and oriented to person, place, and time. She has normal reflexes. No cranial nerve deficit.  Skin: Skin is warm and dry.    Psychiatric: She has a normal  mood and affect. Her behavior is normal. Judgment and thought content normal.  Vitals reviewed.     BP 103/66 mmHg  Pulse 73  Temp(Src) 97.4 F (36.3 C) (Oral)  Ht 5' (1.524 m)  Wt 192 lb (87.091 kg)  BMI 37.50 kg/m2     Assessment & Plan:  1. PUD (peptic ulcer disease) Avoid spicy foods - omeprazole (PRILOSEC) 40 MG capsule; TAKE (1) CAPSULE DAILY  Dispense: 30 capsule; Refill: 5  2. Hypothyroidism, unspecified hypothyroidism type - Thyroid Panel With TSH  3. Hyperlipidemia Low fat diet - NMR, lipoprofile - atorvastatin (LIPITOR) 40 MG tablet; Take 1 tablet (40 mg total) by mouth daily.  Dispense: 90 tablet; Refill: 3  4. GAD (generalized anxiety disorder) Stress management  5. Depressed  6. Essential hypertension, benign Do  Not add slat t odiet - CMP14+EGFR  7. Hypokalemia - potassium chloride SA (K-DUR,KLOR-CON) 20 MEQ tablet; Take 1 tablet (20 mEq total) by mouth 2 (two) times daily.  Dispense: 60 tablet; Refill: 5  8. Neuropathy - gabapentin (NEURONTIN) 300 MG capsule; TAKE (1) CAPSULE THREE TIMES DAILY.  Dispense: 90 capsule; Refill: 5    Labs pending Health maintenance reviewed Diet and exercise encouraged Continue all meds Follow up  In 6 months   [place], FNP

## 2014-05-12 LAB — CMP14+EGFR
ALT: 31 IU/L (ref 0–32)
AST: 32 IU/L (ref 0–40)
Albumin/Globulin Ratio: 2 (ref 1.1–2.5)
Albumin: 4.6 g/dL (ref 3.5–4.8)
Alkaline Phosphatase: 53 IU/L (ref 39–117)
BUN/Creatinine Ratio: 17 (ref 11–26)
BUN: 13 mg/dL (ref 8–27)
Bilirubin Total: 1.1 mg/dL (ref 0.0–1.2)
CO2: 23 mmol/L (ref 18–29)
Calcium: 10.6 mg/dL — ABNORMAL HIGH (ref 8.7–10.3)
Chloride: 97 mmol/L (ref 97–108)
Creatinine, Ser: 0.78 mg/dL (ref 0.57–1.00)
GFR calc Af Amer: 88 mL/min/{1.73_m2} (ref >59)
GFR calc non Af Amer: 76 mL/min/{1.73_m2} (ref >59)
Globulin, Total: 2.3 g/dL (ref 1.5–4.5)
Glucose: 95 mg/dL (ref 65–99)
Potassium: 4.7 mmol/L (ref 3.5–5.2)
Sodium: 139 mmol/L (ref 134–144)
Total Protein: 6.9 g/dL (ref 6.0–8.5)

## 2014-05-12 LAB — THYROID PANEL WITH TSH
Free Thyroxine Index: 2.2 (ref 1.2–4.9)
T3 Uptake Ratio: 27 % (ref 24–39)
T4, Total: 8.1 ug/dL (ref 4.5–12.0)
TSH: 1.8 u[IU]/mL (ref 0.450–4.500)

## 2014-05-12 LAB — NMR, LIPOPROFILE
Cholesterol: 137 mg/dL (ref 100–199)
HDL Cholesterol by NMR: 45 mg/dL (ref >39)
HDL Particle Number: 35 umol/L (ref >=30.5)
LDL Particle Number: 872 nmol/L (ref <1000)
LDL Size: 20.9 nm (ref >20.5)
LDL-C: 64 mg/dL (ref 0–99)
LP-IR Score: 55 — ABNORMAL HIGH (ref <=45)
Small LDL Particle Number: 275 nmol/L (ref <=527)
Triglycerides by NMR: 142 mg/dL (ref 0–149)

## 2014-05-14 ENCOUNTER — Telehealth: Payer: Self-pay | Admitting: Nurse Practitioner

## 2014-06-15 ENCOUNTER — Other Ambulatory Visit: Payer: Self-pay | Admitting: Family Medicine

## 2014-06-22 ENCOUNTER — Other Ambulatory Visit: Payer: Self-pay | Admitting: Family

## 2014-06-23 ENCOUNTER — Other Ambulatory Visit: Payer: Self-pay | Admitting: *Deleted

## 2014-06-25 ENCOUNTER — Other Ambulatory Visit: Payer: Self-pay | Admitting: Pharmacist

## 2014-06-25 MED ORDER — DENOSUMAB 60 MG/ML ~~LOC~~ SOLN: 60.0000 mg | SUBCUTANEOUS | Status: DC

## 2014-06-26 ENCOUNTER — Telehealth: Payer: Self-pay

## 2014-06-26 NOTE — Telephone Encounter (Signed)
Insurance approved prior authorization for Prolia through 06/26/2015

## 2014-06-27 ENCOUNTER — Other Ambulatory Visit: Payer: Self-pay | Admitting: Nurse Practitioner

## 2014-06-27 NOTE — Telephone Encounter (Signed)
Last filled 05/29/14, last seen 05/11/14. Pt uses [facility]

## 2014-06-27 NOTE — Telephone Encounter (Signed)
Please call in xanax with 1 refills 

## 2014-06-28 NOTE — Telephone Encounter (Signed)
Refill called to pharmacy.

## 2014-07-04 ENCOUNTER — Ambulatory Visit: Payer: Self-pay

## 2014-07-06 ENCOUNTER — Encounter: Payer: Self-pay | Admitting: Pharmacist

## 2014-07-06 ENCOUNTER — Ambulatory Visit (INDEPENDENT_AMBULATORY_CARE_PROVIDER_SITE_OTHER): Payer: Medicare Other | Admitting: Pharmacist

## 2014-07-06 VITALS — BP 114/70 | HR 63 | Ht 60.0 in | Wt 194.0 lb

## 2014-07-06 DIAGNOSIS — Z23 Encounter for immunization: Secondary | ICD-10-CM | POA: Diagnosis not present

## 2014-07-06 DIAGNOSIS — H9193 Unspecified hearing loss, bilateral: Secondary | ICD-10-CM

## 2014-07-06 DIAGNOSIS — Z0000 Encounter for general adult medical examination without abnormal findings: Secondary | ICD-10-CM | POA: Diagnosis not present

## 2014-07-06 DIAGNOSIS — M810 Age-related osteoporosis without current pathological fracture: Secondary | ICD-10-CM

## 2014-07-06 MED ORDER — DENOSUMAB 60 MG/ML ~~LOC~~ SOLN
60.0000 mg | Freq: Once | SUBCUTANEOUS | Status: AC
  Administered 2014-07-06: 60 mg via SUBCUTANEOUS

## 2014-07-06 NOTE — Progress Notes (Signed)
Patient ID: Sheila [name], female   DOB: 11/04/1940, 74 y.o.   MRN: [id number]    Subjective:   Sheila [name] is a 74 y.o. female who presents for an Initial Medicare Annual Wellness Visit and follow up osteoporosis - due to have prolia injected today.  Current Medications (verified) Outpatient Encounter Prescriptions as of 07/06/2014  Medication Sig  . acetaminophen (TYLENOL) 650 MG CR tablet Take 650 mg by mouth every 8 (eight) hours as needed for pain.  [name] albuterol (PROVENTIL HFA;VENTOLIN HFA) 108 (90 BASE) MCG/ACT inhaler Inhale 2 puffs into the lungs every 6 (six) hours as needed for wheezing.  [name] ALPRAZolam (XANAX) 0.5 MG tablet TAKE  (1)  TABLET TWICE A DAY.  [name] atorvastatin (LIPITOR) 20 MG tablet TAKE 1 TABLET DAILY  . azelastine (ASTELIN) 0.1 % nasal spray USE 1 TO 2 SPRAYS IN EACH NOSTRIL AT BEDTIME  . citalopram (CELEXA) 20 MG tablet Take 1 tablet (20 mg total) by mouth daily.  . clobetasol cream (TEMOVATE) 3.84 % Apply 1 application topically 2 (two) times daily.  [name] denosumab (PROLIA) 60 MG/ML SOLN injection Inject 60 mg into the skin every 6 (six) months. Bring to office for administration.  . diclofenac sodium (VOLTAREN) 1 % GEL Apply 2 g topically 4 (four) times daily.  [name] estradiol (ESTRACE) 0.1 MG/GM vaginal cream Place 1 Applicatorful vaginally 3 (three) times a week.  . fluticasone (FLONASE) 50 MCG/ACT nasal spray USE 1 TO 2 SPRAYS IN EACH NOSTRIL AT BEDTIME  . furosemide (LASIX) 20 MG tablet TAKE 1 TABLET DAILY  . gabapentin (NEURONTIN) 300 MG capsule TAKE (1) CAPSULE THREE TIMES DAILY.  [name] levothyroxine (SYNTHROID, LEVOTHROID) 75 MCG tablet Take 1 tablet (75 mcg total) by mouth daily before breakfast.  . Multiple Vitamins-Minerals (CENTRUM SILVER ADULT 50+ PO) Take 1 tablet by mouth every other day.  . Omega-3 Fatty Acids (FISH OIL PO) Take 2 capsules by mouth 2 (two) times daily.  [name] omeprazole (PRILOSEC) 40 MG capsule TAKE (1) CAPSULE DAILY  . potassium chloride SA  (K-DUR,KLOR-CON) 20 MEQ tablet Take 1 tablet (20 mEq total) by mouth 2 (two) times daily.  . traMADol (ULTRAM) 50 MG tablet TAKE (1) TABLET EVERY SIX HOURS AS NEEDED.  [name] verapamil (CALAN-SR) 240 MG CR tablet TAKE 1 & 1/2 TABLETS DAILY.  [name] Vitamin D, Ergocalciferol, (DRISDOL) 50000 UNITS CAPS capsule Take 1 capsule (50,000 Units total) by mouth every 7 (seven) days.  . [DISCONTINUED] olanzapine-FLUoxetine (SYMBYAX) 6-25 MG per capsule Take 1 capsule by mouth every morning.    . [EXPIRED] denosumab (PROLIA) injection 60 mg    No facility-administered encounter medications on file as of 07/06/2014.    Allergies (verified) Benadryl; Codeine; Diphenhydramine hcl; Ibuprofen; and Morphine and related   History: Past Medical History  Diagnosis Date  . Hypertension   . Depression   . Hypercholesteremia   . Diverticulosis of colon   . Hypothyroidism   . Chest pain 07/2009    Myoveiw stress test negative.  [name] DJD (degenerative joint disease) of knee   . Bell's palsy   . UTI (lower urinary tract infection)   . Gastric ulcer with hemorrhage 01/14/2011  . Cataract   . History of fractured pelvis    Past Surgical History  Procedure Laterality Date  . Knee surgery  04/2010.    Total left knee replacement  . Esophagogastroduodenoscopy  01/14/2011    Procedure: ESOPHAGOGASTRODUODENOSCOPY (EGD);  Surgeon: Rogene Houston, MD;  Location: [facility] END[facility] SUITE;  Service:  Endoscopy;  Laterality: N/A;  . Joint replacement    . Hip surgery      pelvis  . Laparoscopic appendectomy N/A 08/01/2013    Procedure: APPENDECTOMY LAPAROSCOPIC;  Surgeon: Jamesetta So, MD;  Location: [facility] ORS;  Service: General;  Laterality: N/A;   Family History  Problem Relation Age of Onset  . Aneurysm Mother   . Stroke Mother   . Hypertension Mother   . Cancer Father     lung  . Heart disease Sister   . Cancer Brother   . Alzheimer's disease Sister   . Cancer Brother    Social History   Occupational History  . Not on  file.   Social History Main Topics  . Smoking status: Former Research scientist (life sciences)  . Smokeless tobacco: Current User    Types: Snuff     Comment: quit yrs and yrs ago when she was very young  . Alcohol Use: No  . Drug Use: No  . Sexual Activity: Not on file    Do you feel safe at home?  Yes  Dietary issues and exercise activities: Current Exercise Habits:: Exercise is limited by, Limited by:: orthopedic condition(s)  Current Dietary habits:  Not following any special diet  Objective:    Today's Vitals   07/06/14 0928 07/06/14 0930  BP: 114/70   Pulse: 63   Height: 5' (1.524 m)   Weight: 194 lb (87.998 kg)   PainSc:  1    Body mass index is 37.89 kg/(m^2).  Activities of Daily Living In your present state of health, do you have any difficulty performing the following activities: 07/06/2014 05/11/2014  Hearing? Y N  Vision? N N  Difficulty concentrating or making decisions? Sheila [name]  Walking or climbing stairs? Y Y  Dressing or bathing? Y N  Doing errands, shopping? N Y  Conservation officer, nature and eating ? Y -  Using the Toilet? N -  In the past six months, have you accidently leaked urine? N -  Do you have problems with loss of bowel control? N -  Managing your Medications? Y -  Managing your Finances? N -  Housekeeping or managing your Housekeeping? Y -    Are there smokers in your home (other than you)? No   Cardiac Risk Factors include: advanced age (>36men, >55 women);dyslipidemia;family history of premature cardiovascular disease;hypertension;obesity (BMI >30kg/m2)  Depression Screen PHQ 2/9 Scores 07/06/2014 05/11/2014 03/06/2014 03/06/2014  PHQ - 2 Score 0 0 5 5  PHQ- 9 Score - - 9 9    Fall Risk Fall Risk  07/06/2014 05/11/2014 03/06/2014 03/06/2014 02/06/2014  Falls in the past year? No No Yes Yes No  Number falls in past yr: - - 1 1 -  Injury with Fall? - - Yes Yes -    Cognitive Function: MMSE - Mini Mental State Exam 07/06/2014 07/06/2014  Orientation to time 5 5  Orientation  to Place 2 2  Registration 3 3  Attention/ Calculation 0 0  Recall 3 3  Language- name 2 objects 2 -  Language- repeat 1 -  Language- follow 3 step command 3 -  Language- read & follow direction 0 -  Write a sentence 0 -  Copy design 1 -  Total score 20 -    Immunizations and Health Maintenance Immunization History  Administered Date(s) Administered  . Influenza,inj,Quad PF,36+ Mos 11/15/2012, 12/11/2013  . Pneumococcal Conjugate-13 07/06/2014  . Tdap 02/23/2006   There are no preventive care reminders to display for this  patient.  Patient Care Team: Sheila Pretty, FNP as PCP - General (Nurse Practitioner)  Indicate any recent Medical Services you may have received from other than [facility] providers in the past year (date may be approximate).    Assessment:    Annual Wellness Visit  osteoporosis   Screening Tests Health Maintenance  Topic Date Due  . PNA vac Low Risk Adult (1 of 2 - PCV13) 08/11/2014 (Originally 07/12/2006)  . TETANUS/TDAP  11/11/2014 (Originally 07/11/1960)  . MAMMOGRAM  12/28/2014 (Originally 11/17/2013)  . INFLUENZA VACCINE  09/24/2014  . DEXA SCAN  06/15/2015  . COLONOSCOPY  03/26/2022  . 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  Discussed increased risk.  Recommended decrease weight and discussed low CHO diet to decrease risk of DM.  Bone Denisty / Osteoporosis Screening - next due 2017  Prolia 60mg  injection SQ today in office   Continue with current calcium intake (1200mg  daily  from food and supplements)  Mammogram - appt made today  For 07/18/14  Glaucoma screening / Diabetic Eye Exam - eye exam needed but unable to afford.  Her niece who is with her today has been in contact with [employer] to see if they will be able to offer assistance.  Nutrition counseling - discussed limiting potatoes, breads, pasta and rice.  Increase non starchy vegetable and whole grains.  Advanced Directives - UTD  Referral sent for [facility] appt for [facility]  Also discussed having her Kalida her medications by days. This is free of charge.  Patient to consider.   Patient Instructions (the written plan) were given to the patient.   Sheila [name], [place], CPP, CDE  07/06/2014

## 2014-07-06 NOTE — Patient Instructions (Signed)
Sheila [name] , Thank you for taking time to come for your Medicare Wellness Visit. I appreciate your ongoing commitment to your health goals. Please review the following plan we discussed and let me know if I can assist you in the future.   Mammogram was scheduled today for [facility] [place] - Wednesday, May 25th 2016 at 2:45pm  Also made referral to audiologist to check hearing  These are the goals we discussed: Goals    None      This is a list of the screening recommended for you and due dates:  Health Maintenance  Topic Date Due  . Pneumonia vaccines  Completed today  . Tetanus Vaccine  2018  . Mammogram  12/28/2014*  . Flu Shot  09/24/2014  . Colon Cancer Screening  03/26/2022  . DEXA scan (bone density measurement)  05/2015  . Shingles Vaccine  Completed  *Topic was postponed. The date shown is not the original due date.     Fall Prevention and Home Safety Falls cause injuries and can affect all age groups. It is possible to use preventive measures to significantly decrease the likelihood of falls. There are many simple measures which can make your home safer and prevent falls. OUTDOORS  Repair cracks and edges of walkways and driveways.  Remove high doorway thresholds.  Trim shrubbery on the main path into your home.  Have good outside lighting.  Clear walkways of tools, rocks, debris, and clutter.  Check that handrails are not broken and are securely fastened. Both sides of steps should have handrails.  Have leaves, snow, and ice cleared regularly.  Use sand or salt on walkways during winter months.  In the garage, clean up grease or oil spills. BATHROOM  Install night lights.  Install grab bars by the toilet and in the tub and shower.  Use non-skid mats or decals in the tub or shower.  Place a plastic non-slip stool in the shower to sit on, if needed.  Keep floors dry and clean up all water on the floor  immediately.  Remove soap buildup in the tub or shower on a regular basis.  Secure bath mats with non-slip, double-sided rug tape.  Remove throw rugs and tripping hazards from the floors. BEDROOMS  Install night lights.  Make sure a bedside light is easy to reach.  Do not use oversized bedding.  Keep a telephone by your bedside.  Have a firm chair with side arms to use for getting dressed.  Remove throw rugs and tripping hazards from the floor. KITCHEN  Keep handles on pots and pans turned toward the center of the stove. Use back burners when possible.  Clean up spills quickly and allow time for drying.  Avoid walking on wet floors.  Avoid hot utensils and knives.  Position shelves so they are not too high or low.  Place commonly used objects within easy reach.  If necessary, use a sturdy step stool with a grab bar when reaching.  Keep electrical cables out of the way.  Do not use floor polish or wax that makes floors slippery. If you must use wax, use non-skid floor wax.  Remove throw rugs and tripping hazards from the floor. STAIRWAYS  Never leave objects on stairs.  Place handrails on both sides of stairways and use them. Fix any loose handrails. Make sure handrails on both sides of the stairways are as long as the stairs.  Check carpeting to make sure it is  firmly attached along stairs. Make repairs to worn or loose carpet promptly.  Avoid placing throw rugs at the top or bottom of stairways, or properly secure the rug with carpet tape to prevent slippage. Get rid of throw rugs, if possible.  Have an electrician put in a light switch at the top and bottom of the stairs. OTHER FALL PREVENTION TIPS  Wear low-heel or rubber-soled shoes that are supportive and fit well. Wear closed toe shoes.  When using a stepladder, make sure it is fully opened and both spreaders are firmly locked. Do not climb a closed stepladder.  Add color or contrast paint or tape to  grab bars and handrails in your home. Place contrasting color strips on first and last steps.  Learn and use mobility aids as needed. Install an electrical emergency response system.  Turn on lights to avoid dark areas. Replace light bulbs that burn out immediately. Get light switches that glow.  Arrange furniture to create clear pathways. Keep furniture in the same place.  Firmly attach carpet with non-skid or double-sided tape.  Eliminate uneven floor surfaces.  Select a carpet pattern that does not visually hide the edge of steps.  Be aware of all pets. OTHER HOME SAFETY TIPS  Set the water temperature for 120 F (48.8 C).  Keep emergency numbers on or near the telephone.  Keep smoke detectors on every level of the home and near sleeping areas. Document Released: 01/30/2002 Document Revised: 08/11/2011 Document Reviewed: 05/01/2011 [facility] Patient Information 2015 [place], [place]. This information is not intended to replace advice given to you by your health care provider. Make sure you discuss any questions you have with your health care provider.    Increase non-starchy vegetables - carrots, green bean, squash, zucchini, tomatoes, onions, peppers, spinach and other green leafy vegetables, cabbage, lettuce, cucumbers, asparagus, okra (not fried), eggplant limit sugar and processed foods (cakes, cookies, ice cream, crackers and chips) Increase fresh fruit but limit serving sizes 1/2 cup or about the size of tennis or baseball limit red meat to no more than 1-2 times per week (serving size about the size of your palm) Choose whole grains / lean proteins - whole wheat bread, quinoa, whole grain rice (1/2 cup), fish, chicken, [place]

## 2014-07-09 ENCOUNTER — Other Ambulatory Visit: Payer: Medicare Other

## 2014-07-09 DIAGNOSIS — Z1212 Encounter for screening for malignant neoplasm of rectum: Secondary | ICD-10-CM

## 2014-07-09 NOTE — Progress Notes (Signed)
Lab only 

## 2014-07-10 LAB — FECAL OCCULT BLOOD, IMMUNOCHEMICAL: Fecal Occult Bld: NEGATIVE

## 2014-07-12 ENCOUNTER — Telehealth: Payer: Self-pay | Admitting: *Deleted

## 2014-07-12 NOTE — Telephone Encounter (Signed)
Pt notified of results

## 2014-07-12 NOTE — Telephone Encounter (Signed)
-----   Message from Chevis Pretty, [place] sent at 07/10/2014  8:17 PM EDT ----- hemocult negative

## 2014-07-18 DIAGNOSIS — Z1231 Encounter for screening mammogram for malignant neoplasm of breast: Secondary | ICD-10-CM | POA: Diagnosis not present

## 2014-07-25 ENCOUNTER — Other Ambulatory Visit: Payer: Self-pay | Admitting: Nurse Practitioner

## 2014-07-25 NOTE — Telephone Encounter (Signed)
Last filled 06/28/14, last seen 05/11/14. Call into [facility]

## 2014-07-25 NOTE — Telephone Encounter (Signed)
Please call in xanax with 1 refills 

## 2014-07-25 NOTE — Telephone Encounter (Signed)
rx called into pharmacy

## 2014-08-07 ENCOUNTER — Encounter: Payer: Self-pay | Admitting: Family Medicine

## 2014-08-13 ENCOUNTER — Ambulatory Visit: Payer: Medicare Other | Admitting: Nurse Practitioner

## 2014-08-16 DIAGNOSIS — H903 Sensorineural hearing loss, bilateral: Secondary | ICD-10-CM | POA: Diagnosis not present

## 2014-08-17 ENCOUNTER — Other Ambulatory Visit: Payer: Self-pay | Admitting: Nurse Practitioner

## 2014-08-20 ENCOUNTER — Other Ambulatory Visit: Payer: Self-pay | Admitting: Family

## 2014-08-20 ENCOUNTER — Encounter: Payer: Self-pay | Admitting: Nurse Practitioner

## 2014-08-20 NOTE — Telephone Encounter (Signed)
lmovm that written Rx is at front desk ready for pickup 

## 2014-08-20 NOTE — Telephone Encounter (Signed)
Last seen 05/11/14 MMM If approved print 

## 2014-08-20 NOTE — Telephone Encounter (Signed)
Ultram rx ready for pick up  

## 2014-09-11 ENCOUNTER — Other Ambulatory Visit: Payer: Self-pay | Admitting: Family Medicine

## 2014-09-18 ENCOUNTER — Other Ambulatory Visit: Payer: Self-pay | Admitting: Nurse Practitioner

## 2014-09-18 NOTE — Telephone Encounter (Signed)
Please advise on refills - last seen 05/11/14, no follow up scheduled.

## 2014-09-26 ENCOUNTER — Other Ambulatory Visit: Payer: Self-pay | Admitting: Family

## 2014-10-09 ENCOUNTER — Other Ambulatory Visit: Payer: Self-pay | Admitting: Nurse Practitioner

## 2014-10-09 NOTE — Telephone Encounter (Signed)
no more refills without being seen  

## 2014-10-09 NOTE — Telephone Encounter (Signed)
Patient aware and appointment scheduled.  

## 2014-10-09 NOTE — Telephone Encounter (Signed)
Last seen and last lipid 05/11/14 MMM

## 2014-10-16 ENCOUNTER — Other Ambulatory Visit: Payer: Self-pay | Admitting: Family

## 2014-10-16 ENCOUNTER — Other Ambulatory Visit: Payer: Self-pay | Admitting: Nurse Practitioner

## 2014-10-16 NOTE — Telephone Encounter (Signed)
Please call in xanax with 1 refills 

## 2014-10-16 NOTE — Telephone Encounter (Signed)
rx called into pharmacy

## 2014-10-16 NOTE — Telephone Encounter (Signed)
Last seen 05/11/14  MMM If approved route to nurse to call into [facility]

## 2014-10-18 ENCOUNTER — Encounter: Payer: Self-pay | Admitting: Nurse Practitioner

## 2014-10-18 ENCOUNTER — Ambulatory Visit (INDEPENDENT_AMBULATORY_CARE_PROVIDER_SITE_OTHER): Payer: Medicare Other | Admitting: Nurse Practitioner

## 2014-10-18 VITALS — BP 105/68 | HR 87 | Temp 97.1°F | Ht 60.0 in | Wt 191.0 lb

## 2014-10-18 DIAGNOSIS — I10 Essential (primary) hypertension: Secondary | ICD-10-CM | POA: Diagnosis not present

## 2014-10-18 DIAGNOSIS — E785 Hyperlipidemia, unspecified: Secondary | ICD-10-CM | POA: Diagnosis not present

## 2014-10-18 DIAGNOSIS — Z6837 Body mass index (BMI) 37.0-37.9, adult: Secondary | ICD-10-CM

## 2014-10-18 DIAGNOSIS — G629 Polyneuropathy, unspecified: Secondary | ICD-10-CM | POA: Diagnosis not present

## 2014-10-18 DIAGNOSIS — E039 Hypothyroidism, unspecified: Secondary | ICD-10-CM | POA: Diagnosis not present

## 2014-10-18 DIAGNOSIS — K5730 Diverticulosis of large intestine without perforation or abscess without bleeding: Secondary | ICD-10-CM | POA: Diagnosis not present

## 2014-10-18 DIAGNOSIS — M810 Age-related osteoporosis without current pathological fracture: Secondary | ICD-10-CM | POA: Diagnosis not present

## 2014-10-18 DIAGNOSIS — F411 Generalized anxiety disorder: Secondary | ICD-10-CM

## 2014-10-18 DIAGNOSIS — E876 Hypokalemia: Secondary | ICD-10-CM

## 2014-10-18 DIAGNOSIS — K279 Peptic ulcer, site unspecified, unspecified as acute or chronic, without hemorrhage or perforation: Secondary | ICD-10-CM | POA: Diagnosis not present

## 2014-10-18 DIAGNOSIS — M25551 Pain in right hip: Secondary | ICD-10-CM

## 2014-10-18 DIAGNOSIS — F32A Depression, unspecified: Secondary | ICD-10-CM

## 2014-10-18 DIAGNOSIS — F329 Major depressive disorder, single episode, unspecified: Secondary | ICD-10-CM

## 2014-10-18 MED ORDER — ATORVASTATIN CALCIUM 40 MG PO TABS: ORAL_TABLET | ORAL | Status: DC

## 2014-10-18 MED ORDER — POTASSIUM CHLORIDE CRYS ER 20 MEQ PO TBCR: EXTENDED_RELEASE_TABLET | ORAL | Status: DC

## 2014-10-18 MED ORDER — FUROSEMIDE 20 MG PO TABS: 20.0000 mg | ORAL_TABLET | Freq: Every day | ORAL | Status: DC

## 2014-10-18 MED ORDER — LEVOTHYROXINE SODIUM 75 MCG PO TABS: 75.0000 ug | ORAL_TABLET | Freq: Every day | ORAL | Status: DC

## 2014-10-18 MED ORDER — METHYLPREDNISOLONE ACETATE 80 MG/ML IJ SUSP
80.0000 mg | Freq: Once | INTRAMUSCULAR | Status: AC
  Administered 2014-10-18: 80 mg via INTRAMUSCULAR

## 2014-10-18 MED ORDER — CITALOPRAM HYDROBROMIDE 20 MG PO TABS: ORAL_TABLET | ORAL | Status: DC

## 2014-10-18 MED ORDER — GABAPENTIN 300 MG PO CAPS: ORAL_CAPSULE | ORAL | Status: DC

## 2014-10-18 MED ORDER — OMEPRAZOLE 40 MG PO CPDR: DELAYED_RELEASE_CAPSULE | ORAL | Status: DC

## 2014-10-18 MED ORDER — TRAMADOL HCL 50 MG PO TABS: 100.0000 mg | ORAL_TABLET | Freq: Two times a day (BID) | ORAL | Status: DC

## 2014-10-18 NOTE — Patient Instructions (Signed)

## 2014-10-18 NOTE — Progress Notes (Signed)
Subjective:    Patient ID: Sheila [name], female    DOB: 14-Jul-1940, 74 y.o.   MRN: [id number]   Patient here today for follow up of chronic medical problems.   Hypertension This is a chronic problem. The current episode started more than 1 year ago. The problem is unchanged. The problem is controlled. Associated symptoms include anxiety. Pertinent negatives include no chest pain, headaches, palpitations or shortness of breath. Risk factors for coronary artery disease include dyslipidemia, post-menopausal state, obesity and sedentary lifestyle. Past treatments include beta blockers. The current treatment provides moderate improvement. Compliance problems include diet and exercise.  Hypertensive end-organ damage includes CAD/MI and a thyroid problem. There is no history of CVA or PVD.  Hyperlipidemia This is a chronic problem. The current episode started more than 1 year ago. The problem is uncontrolled. Recent lipid tests were reviewed and are variable. Pertinent negatives include no chest pain, myalgias or shortness of breath. Current antihyperlipidemic treatment includes statins. The current treatment provides moderate improvement of lipids. Compliance problems include adherence to diet and adherence to exercise.  Risk factors for coronary artery disease include dyslipidemia, hypertension and post-menopausal.  Gastrophageal Reflux She reports no chest pain, no choking or no sore throat. This is a recurrent problem. The problem occurs occasionally. Nothing aggravates the symptoms. Pertinent negatives include no fatigue, melena or orthopnea. There are no known risk factors. She has tried nothing for the symptoms. The treatment provided moderate relief.  Thyroid Problem Visit type: hypothyroidism. Symptoms include anxiety. Patient reports no constipation, diarrhea, fatigue, palpitations or visual change. The symptoms have been stable. Her past medical history is significant for hyperlipidemia.    Anxiety Symptoms include nervous/anxious behavior. Patient reports no chest pain, palpitations or shortness of breath.    depression Currently on celexa- no side effects- seems to be helping hypokalemia K+ supplement- no c/o muscle cramps Neuropathy Has been on gabapentin for awhile- helps a lot with her pain in bil legs  * C/O right hip pain- would like a steroid shot to see if will alleviate some of her pain     Review of Systems  Constitutional: Negative.  Negative for fatigue.  HENT: Negative.  Negative for sore throat.   Eyes: Negative.   Respiratory: Negative.  Negative for choking and shortness of breath.   Cardiovascular: Negative.  Negative for chest pain and palpitations.  Gastrointestinal: Negative for diarrhea, constipation and melena.  Endocrine: Negative.   Genitourinary: Negative.   Musculoskeletal: Negative.  Negative for myalgias.  Neurological: Negative.  Negative for headaches.  Hematological: Negative.   Psychiatric/Behavioral: The patient is nervous/anxious.   All other systems reviewed and are negative.      Objective:   Physical Exam  Constitutional: She is oriented to person, place, and time. She appears well-developed and well-nourished. No distress.  HENT:  Head: Normocephalic and atraumatic.  Right Ear: External ear normal.  Mouth/Throat: Oropharynx is clear and moist.  Eyes: Pupils are equal, round, and reactive to light.  Neck: Normal range of motion. Neck supple. No thyromegaly present.  Cardiovascular: Normal rate, regular rhythm, normal heart sounds and intact distal pulses.   No murmur heard. Pulmonary/Chest: Effort normal and breath sounds normal. No respiratory distress. She has no wheezes.  Abdominal: Soft. Bowel sounds are normal. She exhibits no distension. There is no tenderness.  Musculoskeletal: Normal range of motion. She exhibits no edema or tenderness.  FROM of right hip with ain on abduction- mild point tenderness along  right buttocks  Neurological: She is alert and oriented to person, place, and time. She has normal reflexes. No cranial nerve deficit.  Skin: Skin is warm and dry.  Psychiatric: She has a normal mood and affect. Her behavior is normal. Judgment and thought content normal.  Vitals reviewed.     BP 105/68 mmHg  Pulse 87  Temp(Src) 97.1 F (36.2 C) (Oral)  Ht 5' (1.524 m)  Wt 191 lb (86.637 kg)  BMI 37.30 kg/m2     Assessment & Plan:  1. Essential hypertension, benign Do not add salt to diet - furosemide (LASIX) 20 MG tablet; Take 1 tablet (20 mg total) by mouth daily.  Dispense: 90 tablet; Refill: 1 - CMP14+EGFR  2. Diverticulosis of large intestine without hemorrhage Watch diet- avoid things with small seeds  3. Hypothyroidism, unspecified hypothyroidism type - levothyroxine (SYNTHROID, LEVOTHROID) 75 MCG tablet; Take 1 tablet (75 mcg total) by mouth daily before breakfast.  Dispense: 90 tablet; Refill: 1 - Thyroid Panel With TSH  4. Osteoporosis Weight bearing exercises  5. GAD (generalized anxiety disorder) Stress management  6. Hyperlipidemia Low fat diet - atorvastatin (LIPITOR) 40 MG tablet; Take 1 tablet (40 mg total) by mouth daily.  Dispense: 90 tablet; Refill: 1 - Lipid panel  7. Depressed - citalopram (CELEXA) 20 MG tablet; Take 1 tablet (20 mg total) by mouth daily.  Dispense: 90 tablet; Refill: 1  8. BMI 37.0-37.9, adult Discussed diet and exercise for person with BMI >25 Will recheck weight in 3-6 months   9. Neuropathy - gabapentin (NEURONTIN) 300 MG capsule; TAKE (1) CAPSULE THREE TIMES DAILY.  Dispense: 90 capsule; Refill: 5  10. Hypokalemia - potassium chloride SA (K-DUR,KLOR-CON) 20 MEQ tablet; Take 1 tablet (20 mEq total) by mouth 2 (two) times daily.  Dispense: 180 tablet; Refill: 1  11. PUD (peptic ulcer disease) - omeprazole (PRILOSEC) 40 MG capsule; TAKE (1) CAPSULE DAILY  Dispense: 90 capsule; Refill: 1  12. Hip pain, right -  traMADol (ULTRAM) 50 MG tablet; Take 2 tablets (100 mg total) by mouth 2 (two) times daily.  Dispense: 60 tablet; Refill: 0 - methylPREDNISolone acetate (DEPO-MEDROL) injection 80 mg; Inject 1 mL (80 mg total) into the muscle once.    Labs pending Health maintenance reviewed Diet and exercise encouraged Continue all meds Follow up  In 3 month   [place], FNP

## 2014-10-19 ENCOUNTER — Other Ambulatory Visit: Payer: Self-pay | Admitting: Nurse Practitioner

## 2014-10-19 LAB — CMP14+EGFR
ALT: 23 IU/L (ref 0–32)
AST: 22 IU/L (ref 0–40)
Albumin/Globulin Ratio: 1.7 (ref 1.1–2.5)
Albumin: 4.9 g/dL — ABNORMAL HIGH (ref 3.5–4.8)
Alkaline Phosphatase: 56 IU/L (ref 39–117)
BUN/Creatinine Ratio: 19 (ref 11–26)
BUN: 16 mg/dL (ref 8–27)
Bilirubin Total: 0.8 mg/dL (ref 0.0–1.2)
CO2: 23 mmol/L (ref 18–29)
Calcium: 11.7 mg/dL — ABNORMAL HIGH (ref 8.7–10.3)
Chloride: 96 mmol/L — ABNORMAL LOW (ref 97–108)
Creatinine, Ser: 0.86 mg/dL (ref 0.57–1.00)
GFR calc Af Amer: 78 mL/min/{1.73_m2} (ref >59)
GFR calc non Af Amer: 67 mL/min/{1.73_m2} (ref >59)
Globulin, Total: 2.9 g/dL (ref 1.5–4.5)
Glucose: 107 mg/dL — ABNORMAL HIGH (ref 65–99)
Potassium: 4.3 mmol/L (ref 3.5–5.2)
Sodium: 140 mmol/L (ref 134–144)
Total Protein: 7.8 g/dL (ref 6.0–8.5)

## 2014-10-19 LAB — THYROID PANEL WITH TSH
Free Thyroxine Index: 1.8 (ref 1.2–4.9)
T3 Uptake Ratio: 24 % (ref 24–39)
T4, Total: 7.5 ug/dL (ref 4.5–12.0)
TSH: 3.87 u[IU]/mL (ref 0.450–4.500)

## 2014-10-19 LAB — LIPID PANEL
Chol/HDL Ratio: 2.8 ratio units (ref 0.0–4.4)
Cholesterol, Total: 144 mg/dL (ref 100–199)
HDL: 51 mg/dL (ref >39)
LDL Calculated: 62 mg/dL (ref 0–99)
Triglycerides: 153 mg/dL — ABNORMAL HIGH (ref 0–149)
VLDL Cholesterol Cal: 31 mg/dL (ref 5–40)

## 2014-10-30 ENCOUNTER — Other Ambulatory Visit: Payer: Self-pay | Admitting: Nurse Practitioner

## 2014-10-31 ENCOUNTER — Other Ambulatory Visit: Payer: Self-pay | Admitting: Nurse Practitioner

## 2014-10-31 NOTE — Telephone Encounter (Signed)
Please advise on refill - last seen 10/18/14.

## 2014-10-31 NOTE — Telephone Encounter (Signed)
Ultram rx ready for pick up  

## 2014-11-01 ENCOUNTER — Other Ambulatory Visit: Payer: Self-pay | Admitting: Nurse Practitioner

## 2014-11-08 DIAGNOSIS — B351 Tinea unguium: Secondary | ICD-10-CM | POA: Diagnosis not present

## 2014-11-08 DIAGNOSIS — L03032 Cellulitis of left toe: Secondary | ICD-10-CM | POA: Diagnosis not present

## 2014-11-13 DIAGNOSIS — N905 Atrophy of vulva: Secondary | ICD-10-CM | POA: Diagnosis not present

## 2014-11-13 DIAGNOSIS — L290 Pruritus ani: Secondary | ICD-10-CM | POA: Diagnosis not present

## 2014-11-17 ENCOUNTER — Other Ambulatory Visit: Payer: Self-pay | Admitting: Family Medicine

## 2014-12-13 DIAGNOSIS — L03032 Cellulitis of left toe: Secondary | ICD-10-CM | POA: Diagnosis not present

## 2014-12-14 ENCOUNTER — Other Ambulatory Visit: Payer: Self-pay | Admitting: Nurse Practitioner

## 2014-12-17 ENCOUNTER — Other Ambulatory Visit: Payer: Self-pay | Admitting: Pharmacist

## 2014-12-17 ENCOUNTER — Other Ambulatory Visit: Payer: Self-pay | Admitting: Nurse Practitioner

## 2014-12-17 MED ORDER — DENOSUMAB 60 MG/ML ~~LOC~~ SOLN
60.0000 mg | SUBCUTANEOUS | Status: DC
Start: 2014-12-17 — End: 2015-06-18

## 2014-12-17 NOTE — Telephone Encounter (Signed)
lmovm that written Rx is at front desk ready for pickup 

## 2014-12-17 NOTE — Telephone Encounter (Signed)
RX for Xanax called into [facility] per MMM

## 2014-12-17 NOTE — Telephone Encounter (Signed)
Please call in xanax with 1 refills 

## 2014-12-17 NOTE — Telephone Encounter (Signed)
Last filled 11/02/14, last seen 10/18/14.

## 2014-12-17 NOTE — Telephone Encounter (Signed)
Ultram rx ready for pick up  

## 2014-12-17 NOTE — Telephone Encounter (Signed)
Last seen 10/18/14  MMM  If approved route to nurse to call into [facility]

## 2014-12-29 ENCOUNTER — Emergency Department (HOSPITAL_COMMUNITY)
Admission: EM | Admit: 2014-12-29 | Discharge: 2014-12-29 | Disposition: A | Discharge status: Home / Self Care | Payer: Medicare Other | Attending: Emergency Medicine | Admitting: Emergency Medicine

## 2014-12-29 ENCOUNTER — Encounter (HOSPITAL_COMMUNITY): Payer: Self-pay

## 2014-12-29 ENCOUNTER — Emergency Department (HOSPITAL_COMMUNITY): Payer: Medicare Other

## 2014-12-29 DIAGNOSIS — S92352A Displaced fracture of fifth metatarsal bone, left foot, initial encounter for closed fracture: Secondary | ICD-10-CM | POA: Diagnosis not present

## 2014-12-29 DIAGNOSIS — Y998 Other external cause status: Secondary | ICD-10-CM | POA: Insufficient documentation

## 2014-12-29 DIAGNOSIS — Z79899 Other long term (current) drug therapy: Secondary | ICD-10-CM | POA: Diagnosis not present

## 2014-12-29 DIAGNOSIS — I10 Essential (primary) hypertension: Secondary | ICD-10-CM | POA: Diagnosis not present

## 2014-12-29 DIAGNOSIS — W208XXA Other cause of strike by thrown, projected or falling object, initial encounter: Secondary | ICD-10-CM | POA: Insufficient documentation

## 2014-12-29 DIAGNOSIS — S9032XA Contusion of left foot, initial encounter: Secondary | ICD-10-CM | POA: Diagnosis not present

## 2014-12-29 DIAGNOSIS — Y9289 Other specified places as the place of occurrence of the external cause: Secondary | ICD-10-CM | POA: Diagnosis not present

## 2014-12-29 DIAGNOSIS — Z791 Long term (current) use of non-steroidal anti-inflammatories (NSAID): Secondary | ICD-10-CM | POA: Diagnosis not present

## 2014-12-29 DIAGNOSIS — Z87440 Personal history of urinary (tract) infections: Secondary | ICD-10-CM | POA: Insufficient documentation

## 2014-12-29 DIAGNOSIS — F329 Major depressive disorder, single episode, unspecified: Secondary | ICD-10-CM | POA: Insufficient documentation

## 2014-12-29 DIAGNOSIS — S92355A Nondisplaced fracture of fifth metatarsal bone, left foot, initial encounter for closed fracture: Secondary | ICD-10-CM | POA: Diagnosis not present

## 2014-12-29 DIAGNOSIS — Z87891 Personal history of nicotine dependence: Secondary | ICD-10-CM | POA: Insufficient documentation

## 2014-12-29 DIAGNOSIS — E7800 Pure hypercholesterolemia, unspecified: Secondary | ICD-10-CM | POA: Insufficient documentation

## 2014-12-29 DIAGNOSIS — Y9389 Activity, other specified: Secondary | ICD-10-CM | POA: Insufficient documentation

## 2014-12-29 DIAGNOSIS — Z8719 Personal history of other diseases of the digestive system: Secondary | ICD-10-CM | POA: Diagnosis not present

## 2014-12-29 DIAGNOSIS — E039 Hypothyroidism, unspecified: Secondary | ICD-10-CM | POA: Insufficient documentation

## 2014-12-29 DIAGNOSIS — S99922A Unspecified injury of left foot, initial encounter: Secondary | ICD-10-CM | POA: Diagnosis present

## 2014-12-29 DIAGNOSIS — S92302A Fracture of unspecified metatarsal bone(s), left foot, initial encounter for closed fracture: Secondary | ICD-10-CM

## 2014-12-29 MED ORDER — TRAMADOL HCL 50 MG PO TABS
50.0000 mg | ORAL_TABLET | Freq: Once | ORAL | Status: AC
  Administered 2014-12-29: 50 mg via ORAL

## 2014-12-29 MED ORDER — TRAMADOL HCL 50 MG PO TABS
ORAL_TABLET | ORAL | Status: AC
  Filled 2014-12-29: qty 1

## 2014-12-29 MED ORDER — MORPHINE SULFATE (PF) 4 MG/ML IV SOLN
6.0000 mg | Freq: Once | INTRAVENOUS | Status: DC
  Filled 2014-12-29: qty 2

## 2014-12-29 NOTE — Discharge Instructions (Signed)
Contusion A contusion is a deep bruise. Contusions are the result of a blunt injury to tissues and muscle fibers under the skin. The injury causes bleeding under the skin. The skin overlying the contusion may turn blue, purple, or yellow. Minor injuries will give you a painless contusion, but more severe contusions may stay painful and swollen for a few weeks.  CAUSES  This condition is usually caused by a blow, trauma, or direct force to an area of the body. SYMPTOMS  Symptoms of this condition include:  Swelling of the injured area.  Pain and tenderness in the injured area.  Discoloration. The area may have redness and then turn blue, purple, or yellow. DIAGNOSIS  This condition is diagnosed based on a physical exam and medical history. An X-ray, CT scan, or MRI may be needed to determine if there are any associated injuries, such as broken bones (fractures). TREATMENT  Specific treatment for this condition depends on what area of the body was injured. In general, the best treatment for a contusion is resting, icing, applying pressure to (compression), and elevating the injured area. This is often called the RICE strategy. Over-the-counter anti-inflammatory medicines may also be recommended for pain control.  HOME CARE INSTRUCTIONS   Rest the injured area.  If directed, apply ice to the injured area:  Put ice in a plastic bag.  Place a towel between your skin and the bag.  Leave the ice on for 20 minutes, 2-3 times per day.  If directed, apply light compression to the injured area using an elastic bandage. Make sure the bandage is not wrapped too tightly. Remove and reapply the bandage as directed by your health care provider.  If possible, raise (elevate) the injured area above the level of your heart while you are sitting or lying down.  Take over-the-counter and prescription medicines only as told by your health care provider. SEEK MEDICAL CARE IF:  Your symptoms do not  improve after several days of treatment.  Your symptoms get worse.  You have difficulty moving the injured area. SEEK IMMEDIATE MEDICAL CARE IF:   You have severe pain.  You have numbness in a hand or foot.  Your hand or foot turns pale or cold.   This information is not intended to replace advice given to you by your health care provider. Make sure you discuss any questions you have with your health care provider.   Document Released: 11/19/2004 Document Revised: 10/31/2014 Document Reviewed: 06/27/2014 Elsevier Interactive Patient Education 2016 [employer]  Metatarsal Fracture A metatarsal fracture is a break in a metatarsal bone. Metatarsal bones connect your toe bones to your ankle bones. CAUSES This type of fracture may be caused by:  A sudden twisting of your foot.  A fall onto your foot.  Overuse or repetitive exercise. RISK FACTORS This condition is more likely to develop in people who:  Play contact sports.  Have a bone disease.  Have a low calcium level. SYMPTOMS Symptoms of this condition include:  Pain that is worse when walking or standing.  Pain when pressing on the foot or moving the toes.  Swelling.  Bruising on the top or bottom of the foot.  A foot that appears shorter than the other one. DIAGNOSIS This condition is diagnosed with a physical exam. You may also have imaging tests, such as:  X-rays.  A CT scan.  MRI. TREATMENT Treatment for this condition depends on its severity and whether a bone has moved out of place.  Treatment may involve:  Rest.  Wearing foot support such as a cast, splint, or boot for several weeks.  Using crutches.  Surgery to move bones back into the right position. Surgery is usually needed if there are many pieces of broken bone or bones that are very out of place (displaced fracture).  Physical therapy. This may be needed to help you regain full movement and strength in your foot. You will need to  return to your health care provider to have X-rays taken until your bones heal. Your health care provider will look at the X-rays to make sure that your foot is healing well. HOME CARE INSTRUCTIONS  If You Have a Cast:  Do not stick anything inside the cast to scratch your skin. Doing that increases your risk of infection.  Check the skin around the cast every day. Report any concerns to your health care provider. You may put lotion on dry skin around the edges of the cast. Do not apply lotion to the skin underneath the cast.  Keep the cast clean and dry. If You Have a Splint or a Supportive Boot:  Wear it as directed by your health care provider. Remove it only as directed by your health care provider.  Loosen it if your toes become numb and tingle, or if they turn cold and blue.  Keep it clean and dry. Bathing  Do not take baths, swim, or use a hot tub until your health care provider approves. Ask your health care provider if you can take showers. You may only be allowed to take sponge baths for bathing.  If your health care provider approves bathing and showering, cover the cast or splint with a watertight plastic bag to protect it from water. Do not let the cast or splint get wet. Managing Pain, Stiffness, and Swelling  If directed, apply ice to the injured area (if you have a splint, not a cast).  Put ice in a plastic bag.  Place a towel between your skin and the bag.  Leave the ice on for 20 minutes, 2-3 times per day.  Move your toes often to avoid stiffness and to lessen swelling.  Raise (elevate) the injured area above the level of your heart while you are sitting or lying down. Driving  Do not drive or operate heavy machinery while taking pain medicine.  Do not drive while wearing foot support on a foot that you use for driving. Activity  Return to your normal activities as directed by your health care provider. Ask your health care provider what activities are safe  for you.  Perform exercises as directed by your health care provider or physical therapist. Safety  Do not use the injured foot to support your body weight until your health care provider says that you can. Use crutches as directed by your health care provider. General Instructions  Do not put pressure on any part of the cast or splint until it is fully hardened. This may take several hours.  Do not use any tobacco products, including cigarettes, chewing tobacco, or e-cigarettes. Tobacco can delay bone healing. If you need help quitting, ask your health care provider.  Take medicines only as directed by your health care provider.  Keep all follow-up visits as directed by your health care provider. This is important. SEEK MEDICAL CARE IF:  You have a fever.  Your cast, splint, or boot is too loose or too tight.  Your cast, splint, or boot is damaged.  Your  pain medicine is not helping.  You have pain, tingling, or numbness in your foot that is not going away. SEEK IMMEDIATE MEDICAL CARE IF:  You have severe pain.  You have tingling or numbness in your foot that is getting worse.  Your foot feels cold or becomes numb.  Your foot changes color.   This information is not intended to replace advice given to you by your health care provider. Make sure you discuss any questions you have with your health care provider.   Document Released: 11/01/2001 Document Revised: 06/26/2014 Document Reviewed: 12/06/2013 Elsevier Interactive Patient Education 2016 Else[employer] Inc[employer]  Metatarsal Fracture A metatarsal fracture is a break in a metatarsal bone. Metatarsal bones connect your toe bones to your ankle bones. CAUSES This type of fracture may be caused by:  A sudden twisting of your foot.  A fall onto your foot.  Overuse or repetitive exercise. RISK FACTORS This condition is more likely to develop in people who:  Play contact sports.  Have a bone disease.  Have a low calcium  level. SYMPTOMS Symptoms of this condition include:  Pain that is worse when walking or standing.  Pain when pressing on the foot or moving the toes.  Swelling.  Bruising on the top or bottom of the foot.  A foot that appears shorter than the other one. DIAGNOSIS This condition is diagnosed with a physical exam. You may also have imaging tests, such as:  X-rays.  A CT scan.  MRI. TREATMENT Treatment for this condition depends on its severity and whether a bone has moved out of place. Treatment may involve:  Rest.  Wearing foot support such as a cast, splint, or boot for several weeks.  Using crutches.  Surgery to move bones back into the right position. Surgery is usually needed if there are many pieces of broken bone or bones that are very out of place (displaced fracture).  Physical therapy. This may be needed to help you regain full movement and strength in your foot. You will need to return to your health care provider to have X-rays taken until your bones heal. Your health care provider will look at the X-rays to make sure that your foot is healing well. HOME CARE INSTRUCTIONS  If You Have a Cast:  Do not stick anything inside the cast to scratch your skin. Doing that increases your risk of infection.  Check the skin around the cast every day. Report any concerns to your health care provider. You may put lotion on dry skin around the edges of the cast. Do not apply lotion to the skin underneath the cast.  Keep the cast clean and dry. If You Have a Splint or a Supportive Boot:  Wear it as directed by your health care provider. Remove it only as directed by your health care provider.  Loosen it if your toes become numb and tingle, or if they turn cold and blue.  Keep it clean and dry. Bathing  Do not take baths, swim, or use a hot tub until your health care provider approves. Ask your health care provider if you can take showers. You may only be allowed to take  sponge baths for bathing.  If your health care provider approves bathing and showering, cover the cast or splint with a watertight plastic bag to protect it from water. Do not let the cast or splint get wet. Managing Pain, Stiffness, and Swelling  If directed, apply ice to the injured area (if you have  a splint, not a cast).  Put ice in a plastic bag.  Place a towel between your skin and the bag.  Leave the ice on for 20 minutes, 2-3 times per day.  Move your toes often to avoid stiffness and to lessen swelling.  Raise (elevate) the injured area above the level of your heart while you are sitting or lying down. Driving  Do not drive or operate heavy machinery while taking pain medicine.  Do not drive while wearing foot support on a foot that you use for driving. Activity  Return to your normal activities as directed by your health care provider. Ask your health care provider what activities are safe for you.  Perform exercises as directed by your health care provider or physical therapist. Safety  Do not use the injured foot to support your body weight until your health care provider says that you can. Use crutches as directed by your health care provider. General Instructions  Do not put pressure on any part of the cast or splint until it is fully hardened. This may take several hours.  Do not use any tobacco products, including cigarettes, chewing tobacco, or e-cigarettes. Tobacco can delay bone healing. If you need help quitting, ask your health care provider.  Take medicines only as directed by your health care provider.  Keep all follow-up visits as directed by your health care provider. This is important. SEEK MEDICAL CARE IF:  You have a fever.  Your cast, splint, or boot is too loose or too tight.  Your cast, splint, or boot is damaged.  Your pain medicine is not helping.  You have pain, tingling, or numbness in your foot that is not going away. SEEK IMMEDIATE  MEDICAL CARE IF:  You have severe pain.  You have tingling or numbness in your foot that is getting worse.  Your foot feels cold or becomes numb.  Your foot changes color.   This information is not intended to replace advice given to you by your health care provider. Make sure you discuss any questions you have with your health care provider.   Document Released: 11/01/2001 Document Revised: 06/26/2014 Document Reviewed: 12/06/2013 Elsevier Interactive Patient Education [employer].

## 2014-12-29 NOTE — ED Notes (Signed)
I dropped a crock pot on my left foot.

## 2014-12-29 NOTE — ED Provider Notes (Signed)
CSN: [id number]     Arrival date & time 12/29/14  2142 History  By signing my name below, I, Meriel Pica, attest that this documentation has been prepared under the direction and in the presence of Virgel Manifold, MD. Electronically Signed: Meriel Pica, ED Scribe. 12/29/2014. 10:04 PM.   Chief Complaint  Patient presents with  . Foot Injury   The history is provided by the patient. No language interpreter was used.   HPI Comments: Sheila [name] is a 74 y.o. female, with a PMhx of HTN, who presents to the Emergency Department complaining of sudden onset, constant pain to left foot s/p dropping a crock pot on her left foot 45 PTA. She has associated ecchymosis to dorsum of left foot. Pt is able to bear weight but with pain. She has not taken any pain medication PTA. Denies numbness or tingling. Pt not taking blood thinning medication.    Past Medical History  Diagnosis Date  . Hypertension   . Depression   . Hypercholesteremia   . Diverticulosis of colon   . Hypothyroidism   . Chest pain 07/2009    Myoveiw stress test negative.  [name] DJD (degenerative joint disease) of knee   . Bell's palsy   . UTI (lower urinary tract infection)   . Gastric ulcer with hemorrhage 01/14/2011  . Cataract   . History of fractured pelvis    Past Surgical History  Procedure Laterality Date  . Knee surgery  04/2010.    Total left knee replacement  . Esophagogastroduodenoscopy  01/14/2011    Procedure: ESOPHAGOGASTRODUODENOSCOPY (EGD);  Surgeon: Rogene Houston, MD;  Location: [facility];  Service: Endoscopy;  Laterality: N/A;  . Joint replacement    . Hip surgery      pelvis  . Laparoscopic appendectomy N/A 08/01/2013    Procedure: APPENDECTOMY LAPAROSCOPIC;  Surgeon: Jamesetta So, MD;  Location: [facility] ORS;  Service: General;  Laterality: N/A;  . Appendectomy     Family History  Problem Relation Age of Onset  . Aneurysm Mother   . Stroke Mother   . Hypertension Mother   . Cancer Father      lung  . Heart disease Sister   . Cancer Brother   . Alzheimer's disease Sister   . Cancer Brother    Social History  Substance Use Topics  . Smoking status: Former Research scientist (life sciences)  . Smokeless tobacco: Current User    Types: Snuff     Comment: quit yrs and yrs ago when she was very young  . Alcohol Use: No   OB History    No data available     Review of Systems  Musculoskeletal: Positive for arthralgias ( left foot).  Skin: Positive for color change ( ecchymosis left foot).  Neurological: Negative for numbness.  Hematological: Does not bruise/bleed easily.  All other systems reviewed and are negative.  Allergies  Benadryl; Codeine; Diphenhydramine hcl; Ibuprofen; and Morphine and related  Home Medications   Prior to Admission medications   Medication Sig Start Date End Date Taking? Authorizing Provider  acetaminophen (TYLENOL) 650 MG CR tablet Take 1,300 mg by mouth every 8 (eight) hours as needed for pain.    Yes Historical Provider, MD  ALPRAZolam (XANAX) 0.5 MG tablet TAKE  (1)  TABLET TWICE A DAY. Patient taking differently: Takes 1 mg at bedtime 12/17/14  Yes Mary-Margaret Hassell Done, FNP  atorvastatin (LIPITOR) 40 MG tablet Take 1 tablet (40 mg total) by mouth daily. 10/18/14  Yes Mary-Margaret Hassell Done,  FNP  azelastine (ASTELIN) 0.1 % nasal spray USE 1 TO 2 SPRAYS IN EACH NOSTRIL AT BEDTIME 11/19/14  Yes Mary-Margaret Hassell Done, FNP  azelastine (ASTELIN) 0.1 % nasal spray Place 1 spray into both nostrils at bedtime. Use in each nostril as directed   Yes Historical Provider, MD  citalopram (CELEXA) 20 MG tablet Take 1 tablet (20 mg total) by mouth daily. 10/18/14  Yes Mary-Margaret Hassell Done, FNP  fluticasone (FLONASE) 50 MCG/ACT nasal spray Place 1 spray into both nostrils at bedtime.   Yes Historical Provider, MD  furosemide (LASIX) 20 MG tablet Take 1 tablet (20 mg total) by mouth daily. 10/18/14  Yes Mary-Margaret Hassell Done, FNP  gabapentin (NEURONTIN) 300 MG capsule TAKE (1) CAPSULE THREE  TIMES DAILY. 10/18/14  Yes Mary-Margaret Hassell Done, FNP  hydroxypropyl methylcellulose / hypromellose (ISOPTO TEARS / GONIOVISC) 2.5 % ophthalmic solution Place 1 drop into both eyes as needed for dry eyes.   Yes Historical Provider, MD  levothyroxine (SYNTHROID, LEVOTHROID) 75 MCG tablet Take 1 tablet (75 mcg total) by mouth daily before breakfast. 10/18/14  Yes Mary-Margaret Hassell Done, FNP  Multiple Vitamins-Minerals (CENTRUM SILVER ADULT 50+ PO) Take 1 tablet by mouth every other day.   Yes Historical Provider, MD  Omega-3 Fatty Acids (FISH OIL PO) Take 2 capsules by mouth 2 (two) times daily.   Yes Historical Provider, MD  omeprazole (PRILOSEC) 40 MG capsule TAKE (1) CAPSULE DAILY 10/18/14  Yes Mary-Margaret Hassell Done, FNP  potassium chloride SA (K-DUR,KLOR-CON) 20 MEQ tablet Take 1 tablet (20 mEq total) by mouth 2 (two) times daily. 10/18/14  Yes Mary-Margaret Hassell Done, FNP  traMADol (ULTRAM) 50 MG tablet TAKE (2) TABLETS TWICE DAILY. Patient taking differently: TAKE (2) TABLETS IN THE MORNING 12/17/14  Yes Mary-Margaret Hassell Done, FNP  verapamil (CALAN-SR) 240 MG CR tablet TAKE 1 & 1/2 TABLETS DAILY. 10/16/14  Yes Mary-Margaret Hassell Done, FNP  albuterol (PROVENTIL HFA;VENTOLIN HFA) 108 (90 BASE) MCG/ACT inhaler Inhale 2 puffs into the lungs every 6 (six) hours as needed for wheezing. 05/29/13   Erby Pian, FNP  clobetasol cream (TEMOVATE) 4.65 % Apply 1 application topically 2 (two) times daily. 12/27/13   Lysbeth Penner, FNP  denosumab (PROLIA) 60 MG/ML SOLN injection Inject 60 mg into the skin every 6 (six) months. Bring to office for administration. 12/17/14   Tammy Eckard, PHARMD  diclofenac sodium (VOLTAREN) 1 % GEL Apply 2 g topically 4 (four) times daily. 09/11/14   Mary-Margaret Hassell Done, FNP  estradiol (ESTRACE) 0.1 MG/GM vaginal cream Place 1 Applicatorful vaginally 3 (three) times a week.    Historical Provider, MD  fluticasone (FLONASE) 50 MCG/ACT nasal spray USE 1 TO 2 SPRAYS IN EACH NOSTRIL AT BEDTIME  11/19/14   Mary-Margaret Hassell Done, FNP  Vitamin D, Ergocalciferol, (DRISDOL) 50000 UNITS CAPS capsule Take 1 capsule (50,000 Units total) by mouth every 7 (seven) days. 09/06/13   Christy A Hawks, FNP   BP 159/69 mmHg  Pulse 74  Temp(Src) 97.8 F (36.6 C) (Oral)  Resp 17  Ht 5\' 1"  (1.549 m)  Wt 185 lb (83.915 kg)  BMI 34.97 kg/m2  SpO2 100% Physical Exam  Constitutional: She is oriented to person, place, and time. She appears well-developed and well-nourished. No distress.  HENT:  Head: Normocephalic and atraumatic.  Eyes: EOM are normal.  Neck: Normal range of motion.  Cardiovascular: Normal rate, regular rhythm and normal heart sounds.   Pulmonary/Chest: Effort normal and breath sounds normal.  Abdominal: Soft. She exhibits no distension. There is no tenderness.  Musculoskeletal: Normal  range of motion. She exhibits edema.  Left foot, ecchymosis and swelling over dorsal aspect of left foot centered over the 3rd and 4th metatarsals, closed injury, able to wiggle toes, sensation intact, good cap refill in toes.   Neurological: She is alert and oriented to person, place, and time.  Skin: Skin is warm and dry.  Psychiatric: She has a normal mood and affect. Judgment normal.  Nursing note and vitals reviewed.   ED Course  Procedures  DIAGNOSTIC STUDIES: Oxygen Saturation is 100% on RA, normal by my interpretation.    COORDINATION OF CARE: 10:01 PM Discussed treatment plan with pt at bedside and pt agreed to plan.  Imaging Review Dg Foot Complete Left  12/29/2014  CLINICAL DATA:  Patient dropped an object on the foot today. Complaining of pain, swelling and bruising. EXAM: LEFT FOOT - COMPLETE 3+ VIEW COMPARISON:  None. FINDINGS: There is small piece of bone at the base of the metatarsal could reflect an avulsion fracture, but is more likely a chronic finding. No other evidence of a fracture.  Joints are normally aligned. There is soft tissue swelling that predominates over the dorsal  forefoot. There is a small plantar calcaneal spur. IMPRESSION: 1. Possible small nondisplaced avulsion fracture from the base of the fifth metatarsal. If there is no point tenderness in this region, this should be considered chronic. 2. No other evidence of a fracture.  No dislocation. Electronically Signed   By: Lajean Manes M.D.   On: 12/29/2014 22:54   I have personally reviewed and evaluated these images as part of my medical decision-making.   MDM   Final diagnoses:  Fracture of fifth metatarsal bone, left, closed, initial encounter  Foot contusion, left, initial encounter    74 year old female with foot pain after dropping a crockpot on it. Suspect fracture noted on the fifth metatarsal is chronic. She does has some swelling and ecchymosis over this area but no specific point tenderness. She is more tender over the mid aspect of the third/fourth metatarsals. Closed injury. Symptomatic treatment. Ortho follow-up.  I personally preformed the services scribed in my presence. The recorded information has been reviewed is accurate. Virgel Manifold, MD.    Virgel Manifold, MD 01/10/15 [phone]

## 2014-12-29 NOTE — ED Notes (Signed)
Swelling and bruising noted to left foot.

## 2014-12-31 ENCOUNTER — Encounter: Payer: Self-pay | Admitting: Family

## 2014-12-31 ENCOUNTER — Ambulatory Visit (INDEPENDENT_AMBULATORY_CARE_PROVIDER_SITE_OTHER): Payer: Medicare Other | Admitting: Family

## 2014-12-31 ENCOUNTER — Ambulatory Visit (INDEPENDENT_AMBULATORY_CARE_PROVIDER_SITE_OTHER): Payer: Medicare Other

## 2014-12-31 VITALS — BP 100/64 | HR 57 | Temp 97.6°F

## 2014-12-31 DIAGNOSIS — S300XXA Contusion of lower back and pelvis, initial encounter: Secondary | ICD-10-CM

## 2014-12-31 DIAGNOSIS — S41112A Laceration without foreign body of left upper arm, initial encounter: Secondary | ICD-10-CM

## 2014-12-31 DIAGNOSIS — T1490XA Injury, unspecified, initial encounter: Secondary | ICD-10-CM

## 2014-12-31 DIAGNOSIS — W01198A Fall on same level from slipping, tripping and stumbling with subsequent striking against other object, initial encounter: Secondary | ICD-10-CM | POA: Diagnosis not present

## 2014-12-31 DIAGNOSIS — I959 Hypotension, unspecified: Secondary | ICD-10-CM | POA: Diagnosis not present

## 2014-12-31 DIAGNOSIS — M545 Low back pain: Secondary | ICD-10-CM | POA: Diagnosis not present

## 2014-12-31 DIAGNOSIS — S41102A Unspecified open wound of left upper arm, initial encounter: Secondary | ICD-10-CM | POA: Diagnosis not present

## 2014-12-31 DIAGNOSIS — S92353A Displaced fracture of fifth metatarsal bone, unspecified foot, initial encounter for closed fracture: Secondary | ICD-10-CM | POA: Diagnosis not present

## 2014-12-31 DIAGNOSIS — Y92009 Unspecified place in unspecified non-institutional (private) residence as the place of occurrence of the external cause: Secondary | ICD-10-CM

## 2014-12-31 DIAGNOSIS — Y92099 Unspecified place in other non-institutional residence as the place of occurrence of the external cause: Secondary | ICD-10-CM | POA: Diagnosis not present

## 2014-12-31 DIAGNOSIS — W19XXXA Unspecified fall, initial encounter: Secondary | ICD-10-CM

## 2014-12-31 DIAGNOSIS — T1490 Injury, unspecified: Secondary | ICD-10-CM | POA: Diagnosis not present

## 2014-12-31 MED ORDER — VERAPAMIL HCL ER 240 MG PO TBCR: 240.0000 mg | EXTENDED_RELEASE_TABLET | Freq: Every day | ORAL | Status: DC

## 2014-12-31 NOTE — Patient Instructions (Addendum)
Toe Fracture A toe fracture is a break in one of the toe bones (phalanges). CAUSES This condition may be caused by:  Dropping a heavy object on your toe.  Stubbing your toe.  Overusing your toe or doing repetitive exercise.  Twisting or stretching your toe out of place. RISK FACTORS This condition is more likely to develop in people who:  Play contact sports.  Have a bone disease.  Have a low calcium level. SYMPTOMS The main symptoms of this condition are swelling and pain in the toe. The pain may get worse with standing or walking. Other symptoms include:  Bruising.  Stiffness.  Numbness.  A change in the way the toe looks.  Broken bones that poke through the skin.  Blood beneath the toenail. DIAGNOSIS This condition is diagnosed with a physical exam. You may also have X-rays. TREATMENT  Treatment for this condition depends on the type of fracture and its severity. Treatment may involve:  Taping the broken toe to a toe that is next to it (buddy taping). This is the most common treatment for fractures in which the bone has not moved out of place (nondisplaced fracture).  Wearing a shoe that has a wide, rigid sole to protect the toe and to limit its movement.  Wearing a walking cast.  Having a procedure to move the toe back into place.  Surgery. This may be needed:  If there are many pieces of broken bone that are out of place (displaced).  If the toe joint breaks.  If the bone breaks through the skin.  Physical therapy. This is done to help regain movement and strength in the toe. You may need follow-up X-rays to make sure that the bone is healing well and staying in position. HOME CARE INSTRUCTIONS If You Have a Cast:  Do not stick anything inside the cast to scratch your skin. Doing that increases your risk of infection.  Check the skin around the cast every day. Report any concerns to your health care provider. You may put lotion on dry skin around the  edges of the cast. Do not apply lotion to the skin underneath the cast.  Do not put pressure on any part of the cast until it is fully hardened. This may take several hours.  Keep the cast clean and dry. Bathing  Do not take baths, swim, or use a hot tub until your health care provider approves. Ask your health care provider if you can take showers. You may only be allowed to take sponge baths for bathing.  If your health care provider approves bathing and showering, cover the cast or bandage (dressing) with a watertight plastic bag to protect it from water. Do not let the cast or dressing get wet. Managing Pain, Stiffness, and Swelling  If you do not have a cast, apply ice to the injured area, if directed.  Put ice in a plastic bag.  Place a towel between your skin and the bag.  Leave the ice on for 20 minutes, 2-3 times per day.  Move your toes often to avoid stiffness and to lessen swelling.  Raise (elevate) the injured area above the level of your heart while you are sitting or lying down. Driving  Do not drive or operate heavy machinery while taking pain medicine.  Do not drive while wearing a cast on a foot that you use for driving. Activity  Return to your normal activities as directed by your health care provider. Ask your health care  provider what activities are safe for you.  Perform exercises daily as directed by your health care provider or physical therapist. Safety  Do not use the injured limb to support your body weight until your health care provider says that you can. Use crutches or other assistive devices as directed by your health care provider. General Instructions  If your toe was treated with buddy taping, follow your health care provider's instructions for changing the gauze and tape. Change it more often:  The gauze and tape get wet. If this happens, dry the space between the toes.  The gauze and tape are too tight and cause your toe to become pale  or numb.  Wear a protective shoe as directed by your health care provider. If you were not given a protective shoe, wear sturdy, supportive shoes. Your shoes should not pinch your toes and should not fit tightly against your toes.  Do not use any tobacco products, including cigarettes, chewing tobacco, or e-cigarettes. Tobacco can delay bone healing. If you need help quitting, ask your health care provider.  Take medicines only as directed by your health care provider.  Keep all follow-up visits as directed by your health care provider. This is important. SEEK MEDICAL CARE IF:  You have a fever.  Your pain medicine is not helping.  Your toe is cold.  Your toe is numb.  You still have pain after one week of rest and treatment.  You still have pain after your health care provider has said that you can start walking again.  You have pain, tingling, or numbness in your foot that is not going away. SEEK IMMEDIATE MEDICAL CARE IF:  You have severe pain.  You have redness or inflammation in your toe that is getting worse.  You have pain or numbness in your toe that is getting worse.  Your toe turns blue.   This information is not intended to replace advice given to you by your health care provider. Make sure you discuss any questions you have with your health care provider.   Document Released: 02/07/2000 Document Revised: 10/31/2014 Document Reviewed: 12/06/2013 Elsevier Interactive Patient Education 2016 [place] A contusion is a deep bruise. Contusions are the result of a blunt injury to tissues and muscle fibers under the skin. The injury causes bleeding under the skin. The skin overlying the contusion may turn blue, purple, or yellow. Minor injuries will give you a painless contusion, but more severe contusions may stay painful and swollen for a few weeks.  CAUSES  This condition is usually caused by a blow, trauma, or direct force to an area of the  body. SYMPTOMS  Symptoms of this condition include:  Swelling of the injured area.  Pain and tenderness in the injured area.  Discoloration. The area may have redness and then turn blue, purple, or yellow. DIAGNOSIS  This condition is diagnosed based on a physical exam and medical history. An X-ray, CT scan, or MRI may be needed to determine if there are any associated injuries, such as broken bones (fractures). TREATMENT  Specific treatment for this condition depends on what area of the body was injured. In general, the best treatment for a contusion is resting, icing, applying pressure to (compression), and elevating the injured area. This is often called the RICE strategy. Over-the-counter anti-inflammatory medicines may also be recommended for pain control.  HOME CARE INSTRUCTIONS   Rest the injured area.  If directed, apply ice to the injured area:  Put  ice in a plastic bag.  Place a towel between your skin and the bag.  Leave the ice on for 20 minutes, 2-3 times per day.  If directed, apply light compression to the injured area using an elastic bandage. Make sure the bandage is not wrapped too tightly. Remove and reapply the bandage as directed by your health care provider.  If possible, raise (elevate) the injured area above the level of your heart while you are sitting or lying down.  Take over-the-counter and prescription medicines only as told by your health care provider. SEEK MEDICAL CARE IF:  Your symptoms do not improve after several days of treatment.  Your symptoms get worse.  You have difficulty moving the injured area. SEEK IMMEDIATE MEDICAL CARE IF:   You have severe pain.  You have numbness in a hand or foot.  Your hand or foot turns pale or cold.   This information is not intended to replace advice given to you by your health care provider. Make sure you discuss any questions you have with your health care provider.   Document Released: 11/19/2004  Document Revised: 10/31/2014 Document Reviewed: 06/27/2014 Elsevier Interactive Patient Education 2016 [place] in the Home  Falls can cause injuries and can affect people from all age groups. There are many simple things that you can do to make your home safe and to help prevent falls. WHAT CAN I DO ON THE OUTSIDE OF MY HOME?  Regularly repair the edges of walkways and driveways and fix any cracks.  Remove high doorway thresholds.  Trim any shrubbery on the main path into your home.  Use bright outdoor lighting.  Clear walkways of debris and clutter, including tools and rocks.  Regularly check that handrails are securely fastened and in good repair. Both sides of any steps should have handrails.  Install guardrails along the edges of any raised decks or porches.  Have leaves, snow, and ice cleared regularly.  Use sand or salt on walkways during winter months.  In the garage, clean up any spills right away, including grease or oil spills. WHAT CAN I DO IN THE BATHROOM?  Use night lights.  Install grab bars by the toilet and in the tub and shower. Do not use towel bars as grab bars.  Use non-skid mats or decals on the floor of the tub or shower.  If you need to sit down while you are in the shower, use a plastic, non-slip stool.[name]  Keep the floor dry. Immediately clean up any water that spills on the floor.  Remove soap buildup in the tub or shower on a regular basis.  Attach bath mats securely with double-sided non-slip rug tape.  Remove throw rugs and other tripping hazards from the floor. WHAT CAN I DO IN THE BEDROOM?  Use night lights.  Make sure that a bedside light is easy to reach.  Do not use oversized bedding that drapes onto the floor.  Have a firm chair that has side arms to use for getting dressed.  Remove throw rugs and other tripping hazards from the floor. WHAT CAN I DO IN THE KITCHEN?   Clean up any spills right away.  Avoid  walking on wet floors.  Place frequently used items in easy-to-reach places.  If you need to reach for something above you, use a sturdy step stool that has a grab bar.  Keep electrical cables out of the way.  Do not use floor polish or wax that makes  floors slippery. If you have to use wax, make sure that it is non-skid floor wax.  Remove throw rugs and other tripping hazards from the floor. WHAT CAN I DO IN THE STAIRWAYS?  Do not leave any items on the stairs.  Make sure that there are handrails on both sides of the stairs. Fix handrails that are broken or loose. Make sure that handrails are as long as the stairways.  Check any carpeting to make sure that it is firmly attached to the stairs. Fix any carpet that is loose or worn.  Avoid having throw rugs at the top or bottom of stairways, or secure the rugs with carpet tape to prevent them from moving.  Make sure that you have a light switch at the top of the stairs and the bottom of the stairs. If you do not have them, have them installed. WHAT ARE SOME OTHER FALL PREVENTION TIPS?  Wear closed-toe shoes that fit well and support your feet. Wear shoes that have rubber soles or low heels.  When you use a stepladder, make sure that it is completely opened and that the sides are firmly locked. Have someone hold the ladder while you are using it. Do not climb a closed stepladder.  Add color or contrast paint or tape to grab bars and handrails in your home. Place contrasting color strips on the first and last steps.  Use mobility aids as needed, such as canes, walkers, scooters, and crutches.  Turn on lights if it is dark. Replace any light bulbs that burn out.  Set up furniture so that there are clear paths. Keep the furniture in the same spot.  Fix any uneven floor surfaces.  Choose a carpet design that does not hide the edge of steps of a stairway.  Be aware of any and all pets.  Review your medicines with your healthcare  provider. Some medicines can cause dizziness or changes in blood pressure, which increase your risk of falling. Talk with your health care provider about other ways that you can decrease your risk of falls. This may include working with a physical therapist or trainer to improve your strength, balance, and endurance.   This information is not intended to replace advice given to you by your health care provider. Make sure you discuss any questions you have with your health care provider.   Document Released: 01/30/2002 Document Revised: 06/26/2014 Document Reviewed: 03/16/2014 Elsevier Interactive Patient Education [employer].

## 2014-12-31 NOTE — Progress Notes (Signed)
Subjective:    Patient ID: Sheila [name], female    DOB: 04/20/1940, 74 y.o.   MRN: [id number]  Pt presents to the office from a fall that occurred last night in the bathroom. Pt states she dropped a crockpot on her left foot on Saturday.Pt went to the ED and was told her fifth metatarsal  was fractured. Pt goes to Ortho tomorrow for her foot.  Fall The accident occurred 12 to 24 hours ago. The fall occurred while standing. She landed on hard floor (Back hit bathtub). There was no blood loss. The point of impact was the buttocks and left elbow. The pain is present in the buttocks and back. The pain is at a severity of 9/10. The pain is moderate. The symptoms are aggravated by ambulation and pressure on injury. Pertinent negatives include no bowel incontinence, headaches, hematuria, tingling or visual change. She has tried rest for the symptoms. The treatment provided mild relief.      Review of Systems  Constitutional: Negative.   HENT: Negative.   Eyes: Negative.   Respiratory: Negative.  Negative for shortness of breath.   Cardiovascular: Negative.  Negative for palpitations.  Gastrointestinal: Negative.  Negative for bowel incontinence.  Endocrine: Negative.   Genitourinary: Negative.  Negative for hematuria.  Musculoskeletal: Negative.   Neurological: Negative.  Negative for tingling and headaches.  Hematological: Negative.   Psychiatric/Behavioral: Negative.   All other systems reviewed and are negative.      Objective:   Physical Exam  Constitutional: She is oriented to person, place, and time. She appears well-developed and well-nourished. No distress.  HENT:  Head: Normocephalic and atraumatic.  Eyes: Pupils are equal, round, and reactive to light.  Neck: Normal range of motion. Neck supple. No thyromegaly present.  Cardiovascular: Normal rate, regular rhythm, normal heart sounds and intact distal pulses.   No murmur heard. Pulmonary/Chest: Effort normal and breath  sounds normal. No respiratory distress. She has no wheezes.  Abdominal: Soft. Bowel sounds are normal. She exhibits no distension. There is no tenderness.  Musculoskeletal: Normal range of motion. She exhibits edema (2+ in left foot, ecchymosis of left foot). She exhibits no tenderness.  Neurological: She is alert and oriented to person, place, and time. She has normal reflexes. No cranial nerve deficit.  Skin: Skin is warm and dry.  Psychiatric: She has a normal mood and affect. Her behavior is normal. Judgment and thought content normal.  Vitals reviewed.   Pelvic x-ray- No fracture noted, previous fracture Preliminary reading by  , FNP [facility]   BP 100/64 mmHg  Pulse 57  Temp(Src) 97.6 F (36.4 C) (Oral)  Wt      Assessment & Plan:  1. Injury - DG Pelvis 1-2 Views; Future - CMP14+EGFR  2. Fall at home, initial encounter -Falls precaution discussed - CMP14+EGFR - Ambulatory referral to Physical Therapy  3. Pelvic contusion, initial encounter - CMP14+EGFR  4. Skin tear of left upper arm without complication, initial encounter Vaseline dressing applied -Keep area clean and dry  5. Closed metaphyseal fracture of fifth metatarsal bone  6. Hypotension, unspecified hypotension type -Pt's verapamil decreased to 240 mg daily from 360 mg RTO in 2 weeks to recheck HTN   Fall precaution discussed Rest Physical therapy ordered Keep Ortho appt!! RTO 2 weeks to recheck BP   , FNP     

## 2015-01-01 ENCOUNTER — Encounter: Payer: Self-pay | Admitting: Orthopedic Surgery

## 2015-01-01 ENCOUNTER — Ambulatory Visit (INDEPENDENT_AMBULATORY_CARE_PROVIDER_SITE_OTHER): Payer: Medicare Other | Admitting: Orthopedic Surgery

## 2015-01-01 VITALS — BP 93/52 | Ht 61.0 in | Wt 185.0 lb

## 2015-01-01 DIAGNOSIS — S9032XA Contusion of left foot, initial encounter: Secondary | ICD-10-CM

## 2015-01-01 LAB — CMP14+EGFR
ALT: 19 IU/L (ref 0–32)
AST: 18 IU/L (ref 0–40)
Albumin/Globulin Ratio: 1.7 (ref 1.1–2.5)
Albumin: 4.2 g/dL (ref 3.5–4.8)
Alkaline Phosphatase: 51 IU/L (ref 39–117)
BUN/Creatinine Ratio: 24 (ref 11–26)
BUN: 18 mg/dL (ref 8–27)
Bilirubin Total: 0.4 mg/dL (ref 0.0–1.2)
CO2: 25 mmol/L (ref 18–29)
Calcium: 10.7 mg/dL — ABNORMAL HIGH (ref 8.7–10.3)
Chloride: 98 mmol/L (ref 97–106)
Creatinine, Ser: 0.76 mg/dL (ref 0.57–1.00)
GFR calc Af Amer: 90 mL/min/{1.73_m2} (ref >59)
GFR calc non Af Amer: 78 mL/min/{1.73_m2} (ref >59)
Globulin, Total: 2.5 g/dL (ref 1.5–4.5)
Glucose: 103 mg/dL — ABNORMAL HIGH (ref 65–99)
Potassium: 4.4 mmol/L (ref 3.5–5.2)
Sodium: 139 mmol/L (ref 136–144)
Total Protein: 6.7 g/dL (ref 6.0–8.5)

## 2015-01-01 MED ORDER — TRAMADOL HCL 50 MG PO TABS: 100.0000 mg | ORAL_TABLET | ORAL | Status: DC | PRN

## 2015-01-01 NOTE — Progress Notes (Signed)
Chief Complaint  Patient presents with  . Foot Injury    er follow up Left foot fracture, DOI 12/29/14    LC home dropped a chronic problem left foot.  She complains of pain over the left foot. It's a dull ache. It severe causing her to limp. Duration now working on 3 days. Timing constant. Context as described. Modifying factors weightbearing  System review her skin is bruised and ecchymotic. She has some pain in her pelvis. She denies fever. Denies numbness or tingling.  Past Medical History  Diagnosis Date  . Hypertension   . Depression   . Hypercholesteremia   . Diverticulosis of colon   . Hypothyroidism   . Chest pain 07/2009    Myoveiw stress test negative.  [name] DJD (degenerative joint disease) of knee   . Bell's palsy   . UTI (lower urinary tract infection)   . Gastric ulcer with hemorrhage 01/14/2011  . Cataract   . History of fractured pelvis     BP 93/52 mmHg  Ht 5\' 1"  (1.549 m)  Wt 185 lb (83.915 kg)  BMI 34.97 kg/m2  AppearancE she is mesomorphic She is oriented 3 Mood is normal Gait is supported by a hard sole shoe and a walker  Foot is swollen tender over the dorsum Ankle motion is full Ankle stability is normal Motor exam is showing no atrophy in the foot  Skin ecchymotic bruised  Neurologic exam sensation is normal  Vascular exam shows normal color and normal posterior tibial artery pulse   I independently read the x-ray  She has no fracture in the area of swelling has old bone fragments in the proximal aspect of the fifth metatarsal  Follow-up in a month  I adjusted her tramadol dosing  Wear postop shoe for walking  Diagnosis foot contusion

## 2015-01-04 ENCOUNTER — Other Ambulatory Visit: Payer: Self-pay | Admitting: *Deleted

## 2015-01-04 DIAGNOSIS — R062 Wheezing: Secondary | ICD-10-CM

## 2015-01-04 MED ORDER — ALBUTEROL SULFATE HFA 108 (90 BASE) MCG/ACT IN AERS: 2.0000 | INHALATION_SPRAY | Freq: Four times a day (QID) | RESPIRATORY_TRACT | Status: DC | PRN

## 2015-01-07 ENCOUNTER — Encounter: Payer: Self-pay | Admitting: Family

## 2015-01-07 ENCOUNTER — Ambulatory Visit (INDEPENDENT_AMBULATORY_CARE_PROVIDER_SITE_OTHER): Payer: Medicare Other | Admitting: Family

## 2015-01-07 VITALS — BP 99/67 | HR 97 | Temp 97.5°F | Ht 61.0 in | Wt 194.6 lb

## 2015-01-07 DIAGNOSIS — Z23 Encounter for immunization: Secondary | ICD-10-CM | POA: Diagnosis not present

## 2015-01-07 DIAGNOSIS — J209 Acute bronchitis, unspecified: Secondary | ICD-10-CM

## 2015-01-07 DIAGNOSIS — I959 Hypotension, unspecified: Secondary | ICD-10-CM

## 2015-01-07 DIAGNOSIS — S9032XD Contusion of left foot, subsequent encounter: Secondary | ICD-10-CM

## 2015-01-07 DIAGNOSIS — R062 Wheezing: Secondary | ICD-10-CM | POA: Diagnosis not present

## 2015-01-07 MED ORDER — ALBUTEROL SULFATE HFA 108 (90 BASE) MCG/ACT IN AERS: 2.0000 | INHALATION_SPRAY | Freq: Four times a day (QID) | RESPIRATORY_TRACT | Status: DC | PRN

## 2015-01-07 MED ORDER — METHYLPREDNISOLONE 4 MG PO TBPK: ORAL_TABLET | ORAL | Status: DC

## 2015-01-07 MED ORDER — VERAPAMIL HCL 120 MG PO TABS: 120.0000 mg | ORAL_TABLET | Freq: Every day | ORAL | Status: DC

## 2015-01-07 MED ORDER — VERAPAMIL HCL ER 120 MG PO TBCR: 120.0000 mg | EXTENDED_RELEASE_TABLET | Freq: Every day | ORAL | Status: DC

## 2015-01-07 NOTE — Patient Instructions (Signed)
Hypotension  As your heart beats, it forces blood through your arteries. This force is your blood pressure. If your blood pressure is too low for you to go about your normal activities or to support the organs of your body, you have hypotension. Hypotension is also referred to as low blood pressure. When your blood pressure becomes too low, you may not get enough blood to your brain. As a result, you may feel weak, feel lightheaded, or develop a rapid heart rate. In a more severe case, you may faint.  CAUSES  Various conditions can cause hypotension. These include:  · Blood loss.  · Dehydration.  · Heart or endocrine problems.  · Pregnancy.  · Severe infection.  · Not having a well-balanced diet filled with needed nutrients.  · Severe allergic reactions (anaphylaxis).  Some medicines, such as blood pressure medicine or water pills (diuretics), may lower your blood pressure below normal. Sometimes taking too much medicine or taking medicine not as directed can cause hypotension.  TREATMENT   Hospitalization is sometimes required for hypotension if fluid or blood replacement is needed, if time is needed for medicines to wear off, or if further monitoring is needed. Treatment might include changing your diet, changing your medicines (including medicines aimed at raising your blood pressure), and use of support stockings.  HOME CARE INSTRUCTIONS   · Drink enough fluids to keep your urine clear or pale yellow.  · Take your medicines as directed by your health care provider.  · Get up slowly from reclining or sitting positions. This gives your blood pressure a chance to adjust.  · Wear support stockings as directed by your health care provider.  · Maintain a healthy diet by including nutritious food, such as fruits, vegetables, nuts, whole grains, and lean meats.  SEEK MEDICAL CARE IF:  · You have vomiting or diarrhea.  · You have a fever for more than 2-3 days.  · You feel more thirsty than usual.  · You feel weak and  tired.  SEEK IMMEDIATE MEDICAL CARE IF:   · You have chest pain or a fast or irregular heartbeat.  · You have a loss of feeling in some part of your body, or you lose movement in your arms or legs.  · You have trouble speaking.  · You become sweaty or feel lightheaded.  · You faint.  MAKE SURE YOU:   · Understand these instructions.  · Will watch your condition.  · Will get help right away if you are not doing well or get worse.     This information is not intended to replace advice given to you by your health care provider. Make sure you discuss any questions you have with your health care provider.     Document Released: 02/09/2005 Document Revised: 11/30/2012 Document Reviewed: 08/12/2012  Elsevier Interactive Patient Education ©2016 Elsevier Inc[employer]

## 2015-01-07 NOTE — Addendum Note (Signed)
Addended by: [name] A on: 01/07/2015 10:34 AM   Modules accepted: Orders

## 2015-01-07 NOTE — Progress Notes (Signed)
Subjective:    Patient ID: Sheila [name], female    DOB: 09-22-1940, 74 y.o.   MRN: [id number]  HPI Pt presents to the office today to recheck left foot swelling. Pt dropped Crockpot on her foot last Saturday and was diagnosed with left foot contusion. Pt saw the Ortho and told pt to wear postop shoe for one month and continue with tramadol.  Daughter states the foot turned from a purple bruise to redness. Daughter states it has a blister now and is worried about the redness.  Pt's BP is low today. Pt is currently taking Verapamil 240 mg daily. Pt states she had not seen a cardiologists in "years". Pt is also complaining of a productive cough that started 2-3 days. Pt states she is "spitting up some mucus that tastes horrible".    Review of Systems  Constitutional: Negative.   HENT: Negative.   Eyes: Negative.   Respiratory: Negative.  Negative for shortness of breath.   Cardiovascular: Negative.  Negative for palpitations.  Gastrointestinal: Negative.   Endocrine: Negative.   Genitourinary: Negative.   Musculoskeletal: Negative.   Neurological: Negative.  Negative for headaches.  Hematological: Negative.   Psychiatric/Behavioral: Negative.   All other systems reviewed and are negative.      Objective:   Physical Exam  Constitutional: She is oriented to person, place, and time. She appears well-developed and well-nourished. No distress.  HENT:  Head: Normocephalic and atraumatic.  Eyes: Pupils are equal, round, and reactive to light.  Neck: Normal range of motion. Neck supple. No thyromegaly present.  Cardiovascular: Normal rate, regular rhythm, normal heart sounds and intact distal pulses.   No murmur heard. Pulmonary/Chest: Effort normal. No respiratory distress. She has wheezes.  Abdominal: Soft. Bowel sounds are normal. She exhibits no distension. There is no tenderness.  Musculoskeletal: Normal range of motion. She exhibits edema (3+ in left foot with redness 13X10 cm) and  tenderness.  Neurological: She is alert and oriented to person, place, and time. She has normal reflexes. No cranial nerve deficit.  Skin: Skin is warm and dry.  Psychiatric: She has a normal mood and affect. Her behavior is normal. Judgment and thought content normal.  Vitals reviewed.     BP 99/67 mmHg  Pulse 97  Temp(Src) 97.5 F (36.4 C) (Oral)  Ht 5\' 1"  (1.549 m)  Wt 194 lb 9.6 oz (88.27 kg)  BMI 36.79 kg/m2     Assessment & Plan:  1. Hypotension, unspecified hypotension type -Pt's verapamil decreased 120 mg from 240 mg -Falls precaution discussed  2. Contusion of left foot, subsequent encounter -Rest -Keep foot elevated and avoid walking on foot  3. Acute bronchitis, unspecified organism -- Take meds as prescribed - Use a cool mist humidifier  -Use saline nose sprays frequently -Saline irrigations of the nose can be very helpful if done frequently.  * 4X daily for 1 week*  * Use of a nettie pot can be helpful with this. Follow directions with this* -Force fluids -For any cough or congestion  Use plain Mucinex- regular strength or max strength is fine   * Children- consult with Pharmacist for dosing -For fever or aces or pains- take tylenol or ibuprofen appropriate for age and weight.  * for fevers greater than 101 orally you may alternate ibuprofen and tylenol every  3 hours. -Throat lozenges if help - methylPREDNISolone (MEDROL DOSEPAK) 4 MG TBPK tablet; Use as directed  Dispense: 21 tablet; Refill: 0 - albuterol (PROVENTIL HFA;VENTOLIN HFA) 108 (  90 BASE) MCG/ACT inhaler; Inhale 2 puffs into the lungs every 6 (six) hours as needed for wheezing.  Dispense: 1 Inhaler; Refill: 2  4. Wheezing - albuterol (PROVENTIL HFA;VENTOLIN HFA) 108 (90 BASE) MCG/ACT inhaler; Inhale 2 puffs into the lungs every 6 (six) hours as needed for wheezing.  Dispense: 1 Inhaler; Refill: 2   Continue all meds Labs pending Health Maintenance reviewed- Flu vaccine given today Diet and  exercise encouraged RTO 1 weeks  [name], FNP

## 2015-01-10 ENCOUNTER — Ambulatory Visit (INDEPENDENT_AMBULATORY_CARE_PROVIDER_SITE_OTHER): Payer: Medicare Other | Admitting: Pharmacist

## 2015-01-10 ENCOUNTER — Encounter: Payer: Self-pay | Admitting: Pharmacist

## 2015-01-10 VITALS — Ht 61.0 in | Wt 197.0 lb

## 2015-01-10 DIAGNOSIS — M810 Age-related osteoporosis without current pathological fracture: Secondary | ICD-10-CM | POA: Diagnosis not present

## 2015-01-10 MED ORDER — DENOSUMAB 60 MG/ML ~~LOC~~ SOLN
60.0000 mg | Freq: Once | SUBCUTANEOUS | Status: AC
  Administered 2015-01-10: 60 mg via SUBCUTANEOUS

## 2015-01-10 NOTE — Progress Notes (Signed)
Patient ID: Sheila [name], female   DOB: 04/13/1940, 74 y.o.   MRN: [id number]  [facility] Current Height: Height: 5\' 1"  (154.9 cm)      Max Lifetime Height:  5' 1.5" Current Weight: Weight: 197 lb (89.359 kg)       Ethnicity:Caucasian    HPI: Patient is here today for Prolia injeciton.  She has a diagnosis of ostoeporosis and has been taking Prolia 60mg  injections every 6 months since 05/2011.  Her last injection was 06/2014. She took alendronate in past but experienced treatment failue / continued decrease in BMD dispite compliance with therapy  Back Pain?  Yes       Kyphosis?  Yes Prior fracture?  No                                                             PMH: Age at menopause:   50's Hysterectomy?  No Oophorectomy?  No HRT? No Steroid Use?  No Thyroid med?  Yes History of cancer?  No History of digestive disorders (ie Crohn's)?  Yes - GERD on daily PPI Current or previous eating disorders?  No Last Vitamin D Result:  20.5 (08/2013) Last GFR Result:  78 (01/01/2015)   FH/SH: Family history of osteoporosis?  Yes - sister Parent with history of hip fracture?  No Family history of breast cancer?  No Exercise?  Yes - every evening Smoking?  No Alcohol?  No    Calcium Assessment Calcium Intake  # of servings/day  Calcium mg  Milk (8 oz) 2  x  300  = 600mg   Yogurt (4 oz) 0 x  200 = 0  Cheese (1 oz) 0 x  200 = 0  Other Calcium sources   250mg   Ca supplement MVI every other day = 200mg    Estimated calcium intake per day 850mg     DEXA Results Date of Test T-Score for AP Spine L1-L4 T-Score for Total Left Hip T-Score for Total Right Hip  06/14/2013 0.0 -1.8 -1.6  04/15/2011 -0.2 -1.9 -1.7  05/18/2007 0.0 -1.7 -1.4  12/25/2003 -0.8 -1.5 --  T-Score for neck of left hip = -2.6  Assessment: Osteoporosis - tolerating Prolia with improved BMD at left hip and spine Low vitamin D - take once weekly vitamin D  Recommendations: 1.  Prolia injection given in  office today - left arm, patient brought in medication (filled at her Pharmacy).  DEXA due 05/2014 - decide if will continue Prolia a that time 2.  Continue current calcium intake (has history of hypercalcemia so would not recommend supplementation at this time.  Prolia could help to decrease serum calcium)  3.  recommend weight bearing exercise - 30 minutes at least 4 days per week.   4.  Counseled and educated about fall risk and prevention. 5.  Continue vitamin D supplementation - recommend check vitamin D level with next lab draw.  Recheck DEXA:  2 years  Time spent counseling patient:  30 minutes  Cherre Robins, PharmD, CPP

## 2015-01-14 ENCOUNTER — Other Ambulatory Visit: Payer: Self-pay | Admitting: Nurse Practitioner

## 2015-01-14 ENCOUNTER — Ambulatory Visit (INDEPENDENT_AMBULATORY_CARE_PROVIDER_SITE_OTHER): Payer: Medicare Other | Admitting: Family

## 2015-01-14 ENCOUNTER — Encounter: Payer: Self-pay | Admitting: Family

## 2015-01-14 VITALS — BP 109/66 | HR 89 | Temp 97.3°F | Ht 61.0 in | Wt 190.6 lb

## 2015-01-14 DIAGNOSIS — S9032XD Contusion of left foot, subsequent encounter: Secondary | ICD-10-CM | POA: Diagnosis not present

## 2015-01-14 MED ORDER — TRAMADOL HCL 50 MG PO TABS: 100.0000 mg | ORAL_TABLET | ORAL | Status: DC | PRN

## 2015-01-14 NOTE — Patient Instructions (Signed)

## 2015-01-14 NOTE — Progress Notes (Signed)
   Subjective:    Patient ID: Sheila [name], female    DOB: 1940/10/15, 74 y.o.   MRN: [id number]  HPI :Pt presents to the office today to recheck contusion of left foot. PT dropped Crockpot 2 and half weeks ago. Pt followed up by Ortho and told pt to wear postop shoe for one month and continue with tramadol. Pt states her foot is better and states the redness has turned purple and the blister on the top has started draining a "clear drainage". Pt reports a 8 out 10 pain in her foot and hip.    Review of Systems  Constitutional: Negative.   HENT: Negative.   Eyes: Negative.   Respiratory: Negative.  Negative for shortness of breath.   Cardiovascular: Negative.  Negative for palpitations.  Gastrointestinal: Negative.   Endocrine: Negative.   Genitourinary: Negative.   Musculoskeletal: Negative.   Neurological: Negative.  Negative for headaches.  Hematological: Negative.   Psychiatric/Behavioral: Negative.   All other systems reviewed and are negative.      Objective:   Physical Exam  Constitutional: She is oriented to person, place, and time. She appears well-developed and well-nourished. No distress.  Eyes: Pupils are equal, round, and reactive to light.  Neck: Normal range of motion. Neck supple. No thyromegaly present.  Cardiovascular: Normal rate, regular rhythm, normal heart sounds and intact distal pulses.   No murmur heard. Pulmonary/Chest: Effort normal and breath sounds normal. No respiratory distress. She has no wheezes.  Abdominal: Soft. Bowel sounds are normal. She exhibits no distension. There is no tenderness.  Musculoskeletal: Normal range of motion. She exhibits edema (2+ in left lower foot, with mild tenderness) and tenderness.  Pulses good-2+    Neurological: She is alert and oriented to person, place, and time. She has normal reflexes. No cranial nerve deficit.  Skin: Skin is warm and dry.  Psychiatric: She has a normal mood and affect. Her behavior is normal.  Judgment and thought content normal.  Vitals reviewed.     BP 109/66 mmHg  Pulse 89  Temp(Src) 97.3 F (36.3 C) (Oral)  Ht 5\' 1"  (1.549 m)  Wt 190 lb 9.6 oz (86.456 kg)  BMI 36.03 kg/m2     Assessment & Plan:  1. Contusion of left foot, subsequent encounter -Continue to keep foot elevated -Rest -Do not hit or reinjure foot -Area marked- Foot improved since last week- Continues to be discolored and toe slightly cool-Pulses good -RTO in 2 weeks    [name], FNP

## 2015-01-15 NOTE — Telephone Encounter (Signed)
Last filled 12/17/14, last seen 01/07/15. Route to pool, nurse call in at [facility]

## 2015-01-15 NOTE — Telephone Encounter (Signed)
rx called into pharmacy

## 2015-01-24 ENCOUNTER — Encounter: Payer: Self-pay | Admitting: Family Medicine

## 2015-01-24 ENCOUNTER — Ambulatory Visit (INDEPENDENT_AMBULATORY_CARE_PROVIDER_SITE_OTHER): Payer: Medicare Other | Admitting: Family Medicine

## 2015-01-24 DIAGNOSIS — S39012A Strain of muscle, fascia and tendon of lower back, initial encounter: Secondary | ICD-10-CM

## 2015-01-24 MED ORDER — TRAMADOL HCL 50 MG PO TABS
100.0000 mg | ORAL_TABLET | ORAL | Status: DC | PRN
Start: 2015-01-24 — End: 2015-02-21

## 2015-01-24 MED ORDER — TIZANIDINE HCL 2 MG PO TABS: 2.0000 mg | ORAL_TABLET | Freq: Three times a day (TID) | ORAL | Status: DC | PRN

## 2015-01-24 MED ORDER — DICLOFENAC SODIUM 1 % TD GEL: TRANSDERMAL | Status: DC

## 2015-01-24 NOTE — Progress Notes (Signed)
BP 137/87 mmHg  Pulse 88  Temp(Src) 97.1 F (36.2 C) (Oral)  Ht _0  (1.549 m)  Wt 187 lb 3.2 oz (84.913 kg)  BMI 35.39 kg/m2   Subjective:    Patient ID: Sheila [name], female    DOB: 09/28/1940, 74 y.o.   MRN: [id number]  HPI: Sheila [name] is a 74 y.o. female presenting on 01/24/2015 for Back Pain   HPI Low back pain On Thanksgiving day 1 week ago she fell and hurt her back. She said her grandson was having some adult eggnog and may have had a little too much and then fell and landed on top of her. When she landed she fell back onto her left back and hip and has been having pain since that time. She has been using her tramadol at home and it is been helping some but not completely. She denies any pain shooting down her legs or numbness or weakness going down legs. The pain has not been increasing but it is also not been decreasing.  Relevant past medical, surgical, family and social history reviewed and updated as indicated. Interim medical history since our last visit reviewed. Allergies and medications reviewed and updated.  Review of Systems  Constitutional: Negative for fever and chills.  HENT: Negative for congestion, ear discharge and ear pain.   Eyes: Negative for redness and visual disturbance.  Respiratory: Negative for chest tightness and shortness of breath.   Cardiovascular: Negative for chest pain.  Genitourinary: Negative for dysuria and difficulty urinating.  Musculoskeletal: Positive for myalgias, back pain and arthralgias. Negative for joint swelling and gait problem.  Skin: Negative for rash.  Neurological: Negative for dizziness, tremors, weakness, light-headedness, numbness and headaches.  Psychiatric/Behavioral: Negative for behavioral problems and agitation.  All other systems reviewed and are negative.   Per HPI unless specifically indicated above     Medication List       This list is accurate as of: 01/24/15  1:59 PM.  Always use your most recent  med list.               acetaminophen 650 MG CR tablet  Commonly known as:  TYLENOL  Take 1,300 mg by mouth every 8 (eight) hours as needed for pain.     albuterol 108 (90 BASE) MCG/ACT inhaler  Commonly known as:  PROVENTIL HFA;VENTOLIN HFA  Inhale 2 puffs into the lungs every 6 (six) hours as needed for wheezing.     ALPRAZolam 0.5 MG tablet  Commonly known as:  XANAX  TAKE  (1)  TABLET TWICE A DAY.     atorvastatin 40 MG tablet  Commonly known as:  LIPITOR  Take 1 tablet (40 mg total) by mouth daily.     azelastine 0.1 % nasal spray  Commonly known as:  ASTELIN  USE 1 TO 2 SPRAYS IN EACH NOSTRIL AT BEDTIME     CENTRUM SILVER ADULT 50+ PO  Take 1 tablet by mouth every other day.     citalopram 20 MG tablet  Commonly known as:  CELEXA  Take 1 tablet (20 mg total) by mouth daily.     clobetasol cream 0.05 %  Commonly known as:  TEMOVATE  Apply 1 application topically 2 (two) times daily.     denosumab 60 MG/ML Soln injection  Commonly known as:  PROLIA  Inject 60 mg into the skin every 6 (six) months. Bring to office for administration.     diclofenac sodium 1 % Gel  Commonly known as:  VOLTAREN  Apply 2 g topically 4 (four) times daily.     estradiol 0.1 MG/GM vaginal cream  Commonly known as:  ESTRACE  Place 1 Applicatorful vaginally 3 (three) times a week.     FISH OIL PO  Take 2 capsules by mouth 2 (two) times daily.     fluticasone 50 MCG/ACT nasal spray  Commonly known as:  FLONASE  USE 1 TO 2 SPRAYS IN EACH NOSTRIL AT BEDTIME     furosemide 20 MG tablet  Commonly known as:  LASIX  Take 1 tablet (20 mg total) by mouth daily.     gabapentin 300 MG capsule  Commonly known as:  NEURONTIN  TAKE (1) CAPSULE THREE TIMES DAILY.     hydroxypropyl methylcellulose / hypromellose 2.5 % ophthalmic solution  Commonly known as:  ISOPTO TEARS / GONIOVISC  Place 1 drop into both eyes as needed for dry eyes.     levothyroxine 75 MCG tablet  Commonly known as:   SYNTHROID, LEVOTHROID  Take 1 tablet (75 mcg total) by mouth daily before breakfast.     omeprazole 40 MG capsule  Commonly known as:  PRILOSEC  TAKE (1) CAPSULE DAILY     potassium chloride SA 20 MEQ tablet  Commonly known as:  K-DUR,KLOR-CON  Take 1 tablet (20 mEq total) by mouth 2 (two) times daily.     tiZANidine 2 MG tablet  Commonly known as:  ZANAFLEX  Take 1 tablet (2 mg total) by mouth every 8 (eight) hours as needed for muscle spasms.     traMADol 50 MG tablet  Commonly known as:  ULTRAM  Take 2 tablets (100 mg total) by mouth every 4 (four) hours as needed.     verapamil 120 MG CR tablet  Commonly known as:  CALAN-SR  Take 1 tablet (120 mg total) by mouth at bedtime.     Vitamin D (Ergocalciferol) 50000 UNITS Caps capsule  Commonly known as:  DRISDOL  Take 1 capsule (50,000 Units total) by mouth every 7 (seven) days.           Objective:    BP 137/87 mmHg  Pulse 88  Temp(Src) 97.1 F (36.2 C) (Oral)  Ht _0  (1.549 m)  Wt 187 lb 3.2 oz (84.913 kg)  BMI 35.39 kg/m2  Wt Readings from Last 3 Encounters:  01/24/15 187 lb 3.2 oz (84.913 kg)  01/14/15 190 lb 9.6 oz (86.456 kg)  01/10/15 197 lb (89.359 kg)    Physical Exam  Constitutional: She is oriented to person, place, and time. She appears well-developed and well-nourished. No distress.  Eyes: Conjunctivae and EOM are normal.  Cardiovascular: Normal rate, regular rhythm, normal heart sounds and intact distal pulses.   No murmur heard. Pulmonary/Chest: Effort normal and breath sounds normal. No respiratory distress. She has no wheezes.  Musculoskeletal: Normal range of motion. She exhibits no edema.       Lumbar back: She exhibits tenderness (Tenderness in the paraspinal muscle regions near L5-S1 on the left side. No midline tenderness, negative straight leg raise), pain and spasm. She exhibits normal range of motion, no bony tenderness, no swelling, no edema, no deformity and normal pulse.    Neurological: She is alert and oriented to person, place, and time. Coordination normal.  Skin: Skin is warm and dry. No rash noted. She is not diaphoretic.  Psychiatric: She has a normal mood and affect. Her behavior is normal.  Nursing note and vitals reviewed.  Results for orders placed or performed in visit on 12/31/14  CMP14+EGFR  Result Value Ref Range   Glucose 103 (H) 65 - 99 mg/dL   BUN 18 8 - 27 mg/dL   Creatinine, Ser 0.76 0.57 - 1.00 mg/dL   GFR calc non Af Amer 78 >59 mL/min/1.73   GFR calc Af Amer 90 >59 mL/min/1.73   BUN/Creatinine Ratio 24 11 - 26   Sodium 139 136 - 144 mmol/L   Potassium 4.4 3.5 - 5.2 mmol/L   Chloride 98 97 - 106 mmol/L   CO2 25 18 - 29 mmol/L   Calcium 10.7 (H) 8.7 - 10.3 mg/dL   Total Protein 6.7 6.0 - 8.5 g/dL   Albumin 4.2 3.5 - 4.8 g/dL   Globulin, Total 2.5 1.5 - 4.5 g/dL   Albumin/Globulin Ratio 1.7 1.1 - 2.5   Bilirubin Total 0.4 0.0 - 1.2 mg/dL   Alkaline Phosphatase 51 39 - 117 IU/L   AST 18 0 - 40 IU/L   ALT 19 0 - 32 IU/L      Assessment & Plan:   Problem List Items Addressed This Visit    None    Visit Diagnoses    Strain of muscle, fascia and tendon of lower back, initial encounter        Low back pain likely from muscular strain, will send tramadol and Zanaflex and Voltaren gel and physical therapy    Relevant Medications    traMADol (ULTRAM) 50 MG tablet    tiZANidine (ZANAFLEX) 2 MG tablet    diclofenac sodium (VOLTAREN) 1 % GEL    Other Relevant Orders    Ambulatory referral to Physical Therapy        Follow up plan: Return if symptoms worsen or fail to improve.  Counseling provided for all of the vaccine components Orders Placed This Encounter  Procedures  . Ambulatory referral to Physical Therapy    Caryl Pina, MD [facility] 01/24/2015, 1:59 PM

## 2015-01-28 ENCOUNTER — Ambulatory Visit (INDEPENDENT_AMBULATORY_CARE_PROVIDER_SITE_OTHER): Payer: Medicare Other | Admitting: Family

## 2015-01-28 ENCOUNTER — Encounter: Payer: Self-pay | Admitting: Family

## 2015-01-28 VITALS — BP 130/74 | HR 81 | Temp 98.2°F | Ht 61.0 in | Wt 189.0 lb

## 2015-01-28 DIAGNOSIS — S39012D Strain of muscle, fascia and tendon of lower back, subsequent encounter: Secondary | ICD-10-CM

## 2015-01-28 DIAGNOSIS — S9032XS Contusion of left foot, sequela: Secondary | ICD-10-CM

## 2015-01-28 MED ORDER — PREDNISONE 10 MG (21) PO TBPK: 10.0000 mg | ORAL_TABLET | Freq: Every day | ORAL | Status: DC

## 2015-01-28 NOTE — Patient Instructions (Signed)

## 2015-01-28 NOTE — Progress Notes (Signed)
   Subjective:    Patient ID: Sheila [name], female    DOB: 01/16/1941, 74 y.o.   MRN: [id number]  HPI Pt presents to the office today to recheck contusion of left foot. PT dropped Crockpot 4 and half weeks ago. Pt followed up by Ortho and told pt to wear postop shoe for one month and continue with tramadol. Pt states her foot is better and states the redness and purple color have improved. PT states the blister that was draining  "went away".  Pt reports a 0 out 10 pain in her foot. Pt states since her last visit she "pulled a muscle" in her lower back. Pt states she starts PT next week for that.     Review of Systems  Constitutional: Negative.   HENT: Negative.   Eyes: Negative.   Respiratory: Negative.  Negative for shortness of breath.   Cardiovascular: Negative.  Negative for palpitations.  Gastrointestinal: Negative.   Endocrine: Negative.   Genitourinary: Negative.   Musculoskeletal: Negative.   Neurological: Negative.  Negative for headaches.  Hematological: Negative.   Psychiatric/Behavioral: Negative.   All other systems reviewed and are negative.      Objective:   Physical Exam  Constitutional: She is oriented to person, place, and time. She appears well-developed and well-nourished. No distress.  HENT:  Head: Normocephalic and atraumatic.  Eyes: Pupils are equal, round, and reactive to light.  Neck: Normal range of motion. Neck supple. No thyromegaly present.  Cardiovascular: Normal rate, regular rhythm, normal heart sounds and intact distal pulses.   No murmur heard. Pulmonary/Chest: Effort normal and breath sounds normal. No respiratory distress. She has no wheezes.  Abdominal: Soft. Bowel sounds are normal. She exhibits no distension. There is no tenderness.  Musculoskeletal: Normal range of motion. She exhibits edema (trace amt of swelling in left foot). She exhibits no tenderness.  Neurological: She is alert and oriented to person, place, and time. She has normal  reflexes. No cranial nerve deficit.  Skin: Skin is warm and dry.  Psychiatric: She has a normal mood and affect. Her behavior is normal. Judgment and thought content normal.  Vitals reviewed.     BP 130/74 mmHg  Pulse 81  Temp(Src) 98.2 F (36.8 C) (Oral)  Ht 5\' 1"  (1.549 m)  Wt 189 lb (85.73 kg)  BMI 35.73 kg/m2     Assessment & Plan:  1. Low back strain, subsequent encounter -Rest -Ice and heat as needed -Continue with Zanaflex as needed -Keep appts with Physical Therapy   - predniSONE (STERAPRED UNI-PAK 21 TAB) 10 MG (21) TBPK tablet; Take 1 tablet (10 mg total) by mouth daily. As directed x 6 days  Dispense: 21 tablet; Refill: 0  2. Contusion of left foot, sequela -Falls precaution discussed -Keep follow up appts with Ortho RTO prn    [name], FNP

## 2015-01-29 ENCOUNTER — Ambulatory Visit (INDEPENDENT_AMBULATORY_CARE_PROVIDER_SITE_OTHER): Payer: Medicare Other | Admitting: Orthopedic Surgery

## 2015-01-29 ENCOUNTER — Telehealth: Payer: Self-pay

## 2015-01-29 VITALS — BP 136/72 | Ht 61.0 in | Wt 189.0 lb

## 2015-01-29 DIAGNOSIS — S9032XA Contusion of left foot, initial encounter: Secondary | ICD-10-CM

## 2015-01-29 NOTE — Progress Notes (Signed)
FOLLOW UP VISIT  Chief Complaint  Patient presents with  . Follow-up    follow up left foot, DOI 12/29/14   She complains of pain over the left foot. It's a dull ache. It severe causing her to limp. Duration now working on 3 days. Timing constant. Context as described. Modifying factors weightbearing  System review her skin is bruised and ecchymotic. She has some pain in her pelvis. She denies fever. Denies numbness or tingling.  She says the foot feels better she still having trouble getting her shoe on but she was able to walk with soft tissues and no longer requires the hard sole shoe  The foot is no longer tender though is still swollen and skin is still discolored. I think she can return as needed as this will continue to get better on its own

## 2015-01-29 NOTE — Telephone Encounter (Signed)
Insurance prior authorized Diclofenac through 01/25/16

## 2015-02-01 ENCOUNTER — Other Ambulatory Visit: Payer: Self-pay | Admitting: Family Medicine

## 2015-02-05 ENCOUNTER — Ambulatory Visit: Payer: Medicare Other | Attending: Family Medicine | Admitting: Physical Therapy

## 2015-02-05 DIAGNOSIS — M545 Low back pain, unspecified: Secondary | ICD-10-CM

## 2015-02-05 NOTE — Therapy (Signed)
[facility] [place], [place], [address] Phone: [phone]   Fax:  [phone]  Physical Therapy Evaluation  Patient Details  Name: Sheila [name] MRN: [id number] Date of Birth: April 06, 1940 Referring Provider: Vonna Kotyk Dettinger MD.  Encounter Date: 02/05/2015      PT End of Session - 02/05/15 1332    Visit Number 1   Number of Visits 12   Date for PT Re-Evaluation 03/19/15   PT Start Time 0103   PT Stop Time 0148   PT Time Calculation (min) 45 min   Activity Tolerance Patient tolerated treatment well   Behavior During Therapy [facility] for tasks assessed/performed      Past Medical History  Diagnosis Date  . Hypertension   . Depression   . Hypercholesteremia   . Diverticulosis of colon   . Hypothyroidism   . Chest pain 07/2009    Myoveiw stress test negative.  [name] DJD (degenerative joint disease) of knee   . Bell's palsy   . UTI (lower urinary tract infection)   . Gastric ulcer with hemorrhage 01/14/2011  . Cataract   . History of fractured pelvis     Past Surgical History  Procedure Laterality Date  . Knee surgery  04/2010.    Total left knee replacement  . Esophagogastroduodenoscopy  01/14/2011    Procedure: ESOPHAGOGASTRODUODENOSCOPY (EGD);  Surgeon: Rogene Houston, MD;  Location: [facility] SUITE;  Service: Endoscopy;  Laterality: N/A;  . Joint replacement    . Hip surgery      pelvis  . Laparoscopic appendectomy N/A 08/01/2013    Procedure: APPENDECTOMY LAPAROSCOPIC;  Surgeon: Jamesetta So, MD;  Location: [facility] ORS;  Service: General;  Laterality: N/A;  . Appendectomy      There were no vitals filed for this visit.  Visit Diagnosis:  Left-sided low back pain without sciatica - Plan: PT plan of care cert/re-cert      Subjective Assessment - 02/05/15 1419    Subjective My back hurts a lot.   Patient Stated Goals Get out of pain.   Currently in Pain? Yes   Pain Score 7    Pain Location Back   Pain  Orientation Left   Pain Descriptors / Indicators Aching;Throbbing   Pain Type Acute pain   Pain Onset 1 to 4 weeks ago   Pain Frequency Constant   Aggravating Factors  Movement.   Pain Relieving Factors Rest.            [facility] PT Assessment - 02/05/15 0001    Assessment   Medical Diagnosis LB strain.   Referring Provider Caryl Pina MD.   Onset Date/Surgical Date --  3-4 weeks ago.   Precautions   Precautions None   Restrictions   Weight Bearing Restrictions No   Balance Screen   Has the patient fallen in the past 6 months No   Has the patient had a decrease in activity level because of a fear of falling?  No   Is the patient reluctant to leave their home because of a fear of falling?  No   Home Environment   Living Environment Private residence   Prior Function   Level of Independence Independent   Posture/Postural Control   Posture/Postural Control Postural limitations   Postural Limitations Rounded Shoulders;Forward head;Decreased lumbar lordosis   ROM / Strength   AROM / PROM / Strength AROM   AROM   Overall AROM Comments Active lumbar flexion is limited by 60% and active lumbar  extension is 15 degrees.   Palpation   Palpation comment Very tender over left lumbar paraspinal musculature.  Patient has been using heat at home and has scratched herself in her left low back region.   Special Tests    Special Tests --  Negative RT Slump test.  DTR's 1+/4+.   Ambulation/Gait   Gait Comments The patient ambulates in some flexion due to pain.                   [facility] Adult PT Treatment/Exercise - 02-16-15 0001    Modalities   Modalities Electrical Stimulation   Electrical Stimulation   Electrical Stimulation Location Left low back.   Electrical Stimulation Action 80-150 HZ pre-mod constant x 15 minutes.   Electrical Stimulation Goals Pain                  PT Short Term Goals - 02-16-2015 1445    PT SHORT TERM GOAL #1   Title Ind with HEP.    Time 2   Period Weeks   Status New           PT Long Term Goals - 2015-02-16 1445    PT LONG TERM GOAL #1   Title Perform ADL's with pain not > 3/10.   Time 8   Period Weeks   Status New   PT LONG TERM GOAL #2   Title Sit 30 minutes with pain not > 3/10.   Time 8   Period Weeks   Status New   PT LONG TERM GOAL #3   Title Stand 20 minutes with pain not > 3/10.   Time 8   Period Weeks   Status New               Plan - Feb 16, 2015 1426    Clinical Impression Statement Patient fell against furniture in her home and strained her low back.  She has been in a lot of pain (7+/10) as of late.  he has used heat at home and then scratched herself leaving an abrasion in her left low back region.   Pt will benefit from skilled therapeutic intervention in order to improve on the following deficits Pain;Decreased activity tolerance   Rehab Potential Good   PT Frequency 2x / week   PT Duration 8 weeks   PT Treatment/Interventions ADLs/Self Care Home Management;Moist Heat;Therapeutic exercise;Therapeutic activities;Ultrasound;Manual techniques;Electrical Stimulation;Patient/family education   PT Next Visit Plan Modalities and STW/M to patient's left lumbar region and core exercises.   Consulted and Agree with Plan of Care Patient          G-Codes - 2015/02/16 1446    Functional Assessment Tool Used FOTO.   Functional Limitation Mobility: Walking and moving around   Mobility: Walking and Moving Around Current Status [phone]) At least 60 percent but less than 80 percent impaired, limited or restricted   Mobility: Walking and Moving Around Goal Status [phone]) At least 20 percent but less than 40 percent impaired, limited or restricted       Problem List Patient Active Problem List   Diagnosis Date Noted  . BMI 37.0-37.9, adult 10/18/2014  . Neuropathy ([place]) 10/18/2014  . Hypokalemia 10/18/2014  . Depressed 09/05/2013  . Osteoporosis 06/16/2012  . PUD (peptic ulcer disease)  06/16/2012  . GAD (generalized anxiety disorder) 06/16/2012  . Hyperlipidemia 06/16/2012  . DJD (degenerative joint disease) 06/16/2012  . Hypercalcemia 06/16/2012  . Essential hypertension, benign 06/16/2012  . Gastric ulcer with hemorrhage 01/14/2011  .  Diverticulosis of colon 01/13/2011  . Prerenal azotemia 01/13/2011  . Leukocytosis 01/13/2011  . Sacral insufficiency fracture 01/13/2011  . Groin pain, left lower quadrant 01/13/2011  . Hypothyroidism 01/13/2011  . Orthostatic lightheadedness 01/13/2011  . CHEST PAIN-PRECORDIAL 07/16/2009  . ABNORMAL EKG 07/16/2009    Rhiannon Sassaman, [place] MPT 02/05/2015, 2:49 PM  [facility] [place], [place], [address] Phone: [phone]   Fax:  [phone]  Name: Sheila [name] MRN: [id number] Date of Birth: January 28, 1941

## 2015-02-12 ENCOUNTER — Encounter: Payer: Self-pay | Admitting: Physical Therapy

## 2015-02-12 ENCOUNTER — Ambulatory Visit: Payer: Medicare Other | Admitting: Physical Therapy

## 2015-02-12 DIAGNOSIS — M545 Low back pain, unspecified: Secondary | ICD-10-CM

## 2015-02-12 NOTE — Therapy (Signed)
[facility] [place], [place], [address] Phone: [phone]   Fax:  [phone]  Physical Therapy Treatment  Patient Details  Name: Sheila [name] MRN: [id number] Date of Birth: Jul 18, 1940 Referring Provider: Vonna Kotyk Dettinger MD.  Encounter Date: 02/12/2015      PT End of Session - 02/12/15 1303    Visit Number 2   Number of Visits 12   Date for PT Re-Evaluation 03/19/15   PT Start Time 1304   PT Stop Time 1345   PT Time Calculation (min) 41 min   Activity Tolerance Patient tolerated treatment well   Behavior During Therapy [facility] for tasks assessed/performed      Past Medical History  Diagnosis Date  . Hypertension   . Depression   . Hypercholesteremia   . Diverticulosis of colon   . Hypothyroidism   . Chest pain 07/2009    Myoveiw stress test negative.  [name] DJD (degenerative joint disease) of knee   . Bell's palsy   . UTI (lower urinary tract infection)   . Gastric ulcer with hemorrhage 01/14/2011  . Cataract   . History of fractured pelvis     Past Surgical History  Procedure Laterality Date  . Knee surgery  04/2010.    Total left knee replacement  . Esophagogastroduodenoscopy  01/14/2011    Procedure: ESOPHAGOGASTRODUODENOSCOPY (EGD);  Surgeon: Rogene Houston, MD;  Location: [facility];  Service: Endoscopy;  Laterality: N/A;  . Joint replacement    . Hip surgery      pelvis  . Laparoscopic appendectomy N/A 08/01/2013    Procedure: APPENDECTOMY LAPAROSCOPIC;  Surgeon: Jamesetta So, MD;  Location: [facility] OR[facility];  Service: General;  Laterality: N/A;  . Appendectomy      There were no vitals filed for this visit.  Visit Diagnosis:  Left-sided low back pain without sciatica      Subjective Assessment - 02/12/15 1302    Subjective States that her back is some better but is still giving her a fit. Was just on L side but now all the way across.   Patient Stated Goals Get out of pain.   Currently in Pain? Yes    Pain Score 7    Pain Location Back   Pain Orientation Left   Pain Descriptors / Indicators Aching   Pain Type Acute pain   Pain Onset 1 to 4 weeks ago   Pain Frequency Constant            [facility] PT Assessment - 02/12/15 0001    Assessment   Medical Diagnosis LB strain.                     [facility] Adult PT Treatment/Exercise - 02/12/15 0001    Modalities   Modalities Electrical Stimulation;Ultrasound   Electrical Stimulation   Electrical Stimulation Location Left low back.   Electrical Stimulation Action Pre-Mod   Electrical Stimulation Parameters 80-150 Hz x15 min   Electrical Stimulation Goals Pain   Ultrasound   Ultrasound Location L low back   Ultrasound Parameters 1.5 w/cm2, 100%,1 mhz x10 min   Ultrasound Goals Pain   Manual Therapy   Manual Therapy Myofascial release   Myofascial Release MFR to L low back musculature in R sidelying to decrease tightness and pain                  PT Short Term Goals - 02/05/15 1445    PT SHORT TERM GOAL #1  Title Ind with HEP.   Time 2   Period Weeks   Status New           PT Long Term Goals - 02/05/15 1445    PT LONG TERM GOAL #1   Title Perform ADL's with pain not > 3/10.   Time 8   Period Weeks   Status New   PT LONG TERM GOAL #2   Title Sit 30 minutes with pain not > 3/10.   Time 8   Period Weeks   Status New   PT LONG TERM GOAL #3   Title Stand 20 minutes with pain not > 3/10.   Time 8   Period Weeks   Status New               Plan - 02/12/15 1337    Clinical Impression Statement Patient tolerated today's treatment well with only reports of soreness during manual therapy. Abrasions were noted around L low back and L posterior hip region which patient states was from her scratching following use of heating pad at work. Minimal increased tightness was noted throughout L low back musculature and posterior hip musculature mostly in L lumbar paraspinals and QL. Normal modalities  response noted following removal of the modalites. Educated patient to only use heat at home for 20 minutes at most a few times a day. Experienced 3/10 pain following today's treatment.   Pt will benefit from skilled therapeutic intervention in order to improve on the following deficits Pain;Decreased activity tolerance   Rehab Potential Good   PT Frequency 2x / week   PT Duration 8 weeks   PT Treatment/Interventions ADLs/Self Care Home Management;Moist Heat;Therapeutic exercise;Therapeutic activities;Ultrasound;Manual techniques;Electrical Stimulation;Patient/family education   PT Next Visit Plan Modalities and STW/M to patient's left lumbar region and core exercises.        Problem List Patient Active Problem List   Diagnosis Date Noted  . BMI 37.0-37.9, adult 10/18/2014  . Neuropathy ([place]) 10/18/2014  . Hypokalemia 10/18/2014  . Depressed 09/05/2013  . Osteoporosis 06/16/2012  . PUD (peptic ulcer disease) 06/16/2012  . GAD (generalized anxiety disorder) 06/16/2012  . Hyperlipidemia 06/16/2012  . DJD (degenerative joint disease) 06/16/2012  . Hypercalcemia 06/16/2012  . Essential hypertension, benign 06/16/2012  . Gastric ulcer with hemorrhage 01/14/2011  . Diverticulosis of colon 01/13/2011  . Prerenal azotemia 01/13/2011  . Leukocytosis 01/13/2011  . Sacral insufficiency fracture 01/13/2011  . Groin pain, left lower quadrant 01/13/2011  . Hypothyroidism 01/13/2011  . Orthostatic lightheadedness 01/13/2011  . CHEST PAIN-PRECORDIAL 07/16/2009  . ABNORMAL EKG 07/16/2009    Wynelle Fanny, PTA 02/12/2015, 1:51 PM  [facility] [place], [place], [address] Phone: [phone]   Fax:  [phone]  Name: Sheila [name] MRN: [id number] Date of Birth: Dec 31, 1940

## 2015-02-21 ENCOUNTER — Other Ambulatory Visit: Payer: Self-pay | Admitting: Family Medicine

## 2015-02-22 ENCOUNTER — Telehealth: Payer: Self-pay | Admitting: Family

## 2015-02-22 NOTE — Telephone Encounter (Signed)
Aware, script for pain medication is ready.   Try mucinex, robitussin or delsym OTC.  Drink extra fluids to aid with mucous production.  Call if no improvement.

## 2015-02-22 NOTE — Telephone Encounter (Signed)
RX ready for pick up 

## 2015-02-22 NOTE — Telephone Encounter (Signed)
Last filled 01/24/15, last seen 01/28/15, Rx will print

## 2015-02-22 NOTE — Telephone Encounter (Signed)
Done earlier this morning

## 2015-02-27 ENCOUNTER — Ambulatory Visit: Payer: Medicare Other | Attending: Family Medicine | Admitting: Physical Therapy

## 2015-02-27 ENCOUNTER — Encounter: Payer: Self-pay | Admitting: Physical Therapy

## 2015-02-27 DIAGNOSIS — M545 Low back pain, unspecified: Secondary | ICD-10-CM

## 2015-02-27 NOTE — Therapy (Addendum)
[facility] [place], [place], [address] Phone: [phone]   Fax:  [phone]  Physical Therapy Treatment  Patient Details  Name: Sheila [name] MRN: [id number] Date of Birth: [birth year] Referring Provider: Vonna Kotyk Dettinger MD.  Encounter Date: 02/27/2015      PT End of Session - 02/27/15 1304    Visit Number 3   Number of Visits 12   Date for PT Re-Evaluation 03/19/15   PT Start Time 1304   PT Stop Time 1346   PT Time Calculation (min) 42 min   Activity Tolerance Patient tolerated treatment well   Behavior During Therapy [facility] for tasks assessed/performed      Past Medical History  Diagnosis Date  . Hypertension   . Depression   . Hypercholesteremia   . Diverticulosis of colon   . Hypothyroidism   . Chest pain 07/2009    Myoveiw stress test negative.  [name] DJD (degenerative joint disease) of knee   . Bell's palsy   . UTI (lower urinary tract infection)   . Gastric ulcer with hemorrhage 01/14/2011  . Cataract   . History of fractured pelvis     Past Surgical History  Procedure Laterality Date  . Knee surgery  04/2010.    Total left knee replacement  . Esophagogastroduodenoscopy  01/14/2011    Procedure: ESOPHAGOGASTRODUODENOSCOPY (EGD);  Surgeon: Rogene Houston, MD;  Location[facility];  Service: Endoscopy;  Laterality: N/A;  . Joint replacement    . Hip surgery      pelvis  . Laparoscopic appendectomy N/A 08/01/2013    Procedure: APPENDECTOMY LAPAROSCOPIC;  Surgeon: Jamesetta So, MD;  Location: [facility];  Service: General;  Laterality: N/A;  . Appendectomy      There were no vitals filed for this visit.  Visit Diagnosis:  Left-sided low back pain without sciatica      Subjective Assessment - 02/27/15 1303    Subjective Reports that she has a chest cold and states that her low back hurts when she coughs. At times she has pain going down RLE.   Patient Stated Goals Get out of pain.   Currently in  Pain? Yes   Pain Score 8    Pain Location Back   Pain Orientation Lower;Left   Pain Descriptors / Indicators Aching   Pain Type Acute pain   Pain Onset 1 to 4 weeks ago            [facility] PT Assessment - 02/27/15 0001    Assessment   Medical Diagnosis LB strain.                     [facility] Adult PT Treatment/Exercise - 02/27/15 0001    Modalities   Modalities Electrical Stimulation;Moist Heat;Ultrasound   Moist Heat Therapy   Number Minutes Moist Heat 15 Minutes   Moist Heat Location Lumbar Spine   Electrical Stimulation   Electrical Stimulation Location Left low back.   Electrical Stimulation Action Pre-Mod   Electrical Stimulation Parameters 80-150 Hz x15 min   Electrical Stimulation Goals Pain   Ultrasound   Ultrasound Location L low back   Ultrasound Parameters 1.5 w/cm2, 100%,1 mhz x10 min   Ultrasound Goals Pain   Manual Therapy   Manual Therapy Myofascial release   Myofascial Release MFR to L lumbar paraspinals, QL, posterior hip musculature in R sidelying to decrease pain and tightness  PT Short Term Goals - 02/05/15 1445    PT SHORT TERM GOAL #1   Title Ind with HEP.   Time 2   Period Weeks   Status New           PT Long Term Goals - 02/05/15 1445    PT LONG TERM GOAL #1   Title Perform ADL's with pain not > 3/10.   Time 8   Period Weeks   Status New   PT LONG TERM GOAL #2   Title Sit 30 minutes with pain not > 3/10.   Time 8   Period Weeks   Status New   PT LONG TERM GOAL #3   Title Stand 20 minutes with pain not > 3/10.   Time 8   Period Weeks   Status New               Plan - 02/27/15 1336    Clinical Impression Statement Patient tolerated today's treatment well with only reports of tenderness in L low back during manual therapy. Abrasions that were noted in previous treatment in L low back and L posterior hip region appear to be healing at this time. Patient reports that she is only using  heat at home for 15-20 minutes at a time. No significant tightness noted although she had minimal tightness in L QL today. Normal modalites response noted following removal of the modalities. Patient stated duirng treatment that she was going to call MD about her foot and she was encouraged to include discussion regarding her low back secondary to continued increased pain. Patiet denied pain following today's treatment.   Pt will benefit from skilled therapeutic intervention in order to improve on the following deficits Pain;Decreased activity tolerance   Rehab Potential Good   PT Frequency 2x / week   PT Duration 8 weeks   PT Treatment/Interventions ADLs/Self Care Home Management;Moist Heat;Therapeutic exercise;Therapeutic activities;Ultrasound;Manual techniques;Electrical Stimulation;Patient/family education   PT Next Visit Plan Modalities and STW/M to patient's left lumbar region and core exercises.   Consulted and Agree with Plan of Care Patient        Problem List Patient Active Problem List   Diagnosis Date Noted  . BMI 37.0-37.9, adult 10/18/2014  . Neuropathy ([place]) 10/18/2014  . Hypokalemia 10/18/2014  . Depressed 09/05/2013  . Osteoporosis 06/16/2012  . PUD (peptic ulcer disease) 06/16/2012  . GAD (generalized anxiety disorder) 06/16/2012  . Hyperlipidemia 06/16/2012  . DJD (degenerative joint disease) 06/16/2012  . Hypercalcemia 06/16/2012  . Essential hypertension, benign 06/16/2012  . Gastric ulcer with hemorrhage 01/14/2011  . Diverticulosis of colon 01/13/2011  . Prerenal azotemia 01/13/2011  . Leukocytosis 01/13/2011  . Sacral insufficiency fracture 01/13/2011  . Groin pain, left lower quadrant 01/13/2011  . Hypothyroidism 01/13/2011  . Orthostatic lightheadedness 01/13/2011  . CHEST PAIN-PRECORDIAL 07/16/2009  . ABNORMAL EKG 07/16/2009    Wynelle Fanny, PTA 02/27/2015, 3:08 PM  [facility] [place], [place], [address] Phone: [phone]   Fax:  [phone]  Name: Sheila [name] MRN: [id number] Date of Birth: 01/19/1941  PHYSICAL THERAPY DISCHARGE SUMMARY  Visits from Start of Care: 3.  Current functional level related to goals / functional outcomes: See above.   Remaining deficits: See below.   Education / Equipment:  Plan: Patient agrees to discharge.  Patient goals were not met. Patient is being discharged due to not returning since the last visit.  ?????  Chad Applegate MPT  

## 2015-03-04 ENCOUNTER — Encounter: Payer: Self-pay | Admitting: Family

## 2015-03-04 ENCOUNTER — Ambulatory Visit (INDEPENDENT_AMBULATORY_CARE_PROVIDER_SITE_OTHER): Payer: Medicare Other | Admitting: Family

## 2015-03-04 ENCOUNTER — Ambulatory Visit (INDEPENDENT_AMBULATORY_CARE_PROVIDER_SITE_OTHER): Payer: Medicare Other

## 2015-03-04 VITALS — BP 119/65 | HR 106 | Temp 97.1°F | Ht 61.0 in | Wt 188.0 lb

## 2015-03-04 DIAGNOSIS — J209 Acute bronchitis, unspecified: Secondary | ICD-10-CM | POA: Diagnosis not present

## 2015-03-04 DIAGNOSIS — R062 Wheezing: Secondary | ICD-10-CM

## 2015-03-04 DIAGNOSIS — R0602 Shortness of breath: Secondary | ICD-10-CM

## 2015-03-04 DIAGNOSIS — S39012D Strain of muscle, fascia and tendon of lower back, subsequent encounter: Secondary | ICD-10-CM

## 2015-03-04 MED ORDER — LEVOFLOXACIN 500 MG PO TABS: 500.0000 mg | ORAL_TABLET | Freq: Every day | ORAL | Status: DC

## 2015-03-04 MED ORDER — CYCLOBENZAPRINE HCL 5 MG PO TABS: 5.0000 mg | ORAL_TABLET | Freq: Three times a day (TID) | ORAL | Status: DC | PRN

## 2015-03-04 MED ORDER — BENZONATATE 200 MG PO CAPS: 200.0000 mg | ORAL_CAPSULE | Freq: Three times a day (TID) | ORAL | Status: DC | PRN

## 2015-03-04 MED ORDER — ALBUTEROL SULFATE HFA 108 (90 BASE) MCG/ACT IN AERS: 2.0000 | INHALATION_SPRAY | Freq: Four times a day (QID) | RESPIRATORY_TRACT | Status: DC | PRN

## 2015-03-04 MED ORDER — PREDNISONE 10 MG (21) PO TBPK: 10.0000 mg | ORAL_TABLET | Freq: Every day | ORAL | Status: DC

## 2015-03-04 MED ORDER — TIZANIDINE HCL 2 MG PO TABS: ORAL_TABLET | ORAL | Status: DC

## 2015-03-04 MED ORDER — TRAMADOL HCL 50 MG PO TABS: ORAL_TABLET | ORAL | Status: DC

## 2015-03-04 NOTE — Patient Instructions (Signed)
Acute Bronchitis Bronchitis is inflammation of the airways that extend from the windpipe into the lungs (bronchi). The inflammation often causes mucus to develop. This leads to a cough, which is the most common symptom of bronchitis.  In acute bronchitis, the condition usually develops suddenly and goes away over time, usually in a couple weeks. Smoking, allergies, and asthma can make bronchitis worse. Repeated episodes of bronchitis may cause further lung problems.  CAUSES Acute bronchitis is most often caused by the same virus that causes a cold. The virus can spread from person to person (contagious) through coughing, sneezing, and touching contaminated objects. SIGNS AND SYMPTOMS   Cough.   Fever.   Coughing up mucus.   Body aches.   Chest congestion.   Chills.   Shortness of breath.   Sore throat.  DIAGNOSIS  Acute bronchitis is usually diagnosed through a physical exam. Your health care provider will also ask you questions about your medical history. Tests, such as chest X-rays, are sometimes done to rule out other conditions.  TREATMENT  Acute bronchitis usually goes away in a couple weeks. Oftentimes, no medical treatment is necessary. Medicines are sometimes given for relief of fever or cough. Antibiotic medicines are usually not needed but may be prescribed in certain situations. In some cases, an inhaler may be recommended to help reduce shortness of breath and control the cough. A cool mist vaporizer may also be used to help thin bronchial secretions and make it easier to clear the chest.  HOME CARE INSTRUCTIONS  Get plenty of rest.   Drink enough fluids to keep your urine clear or pale yellow (unless you have a medical condition that requires fluid restriction). Increasing fluids may help thin your respiratory secretions (sputum) and reduce chest congestion, and it will prevent dehydration.   Take medicines only as directed by your health care provider.  If  you were prescribed an antibiotic medicine, finish it all even if you start to feel better.  Avoid smoking and secondhand smoke. Exposure to cigarette smoke or irritating chemicals will make bronchitis worse. If you are a smoker, consider using nicotine gum or skin patches to help control withdrawal symptoms. Quitting smoking will help your lungs heal faster.   Reduce the chances of another bout of acute bronchitis by washing your hands frequently, avoiding people with cold symptoms, and trying not to touch your hands to your mouth, nose, or eyes.   Keep all follow-up visits as directed by your health care provider.  SEEK MEDICAL CARE IF: Your symptoms do not improve after 1 week of treatment.  SEEK IMMEDIATE MEDICAL CARE IF:  You develop an increased fever or chills.   You have chest pain.   You have severe shortness of breath.  You have bloody sputum.   You develop dehydration.  You faint or repeatedly feel like you are going to pass out.  You develop repeated vomiting.  You develop a severe headache. MAKE SURE YOU:   Understand these instructions.  Will watch your condition.  Will get help right away if you are not doing well or get worse.   This information is not intended to replace advice given to you by your health care provider. Make sure you discuss any questions you have with your health care provider.   Document Released: 03/19/2004 Document Revised: 03/02/2014 Document Reviewed: 08/02/2012 Elsevier Interactive Patient Education 2016 [employer]  - Take meds as prescribed - Use a cool mist humidifier  -Use saline nose sprays frequently -Saline   irrigations of the nose can be very helpful if done frequently.  * 4X daily for 1 week*  * Use of a nettie pot can be helpful with this. Follow directions with this* -Force fluids -For any cough or congestion  Use plain Mucinex- regular strength or max strength is fine   * Children- consult with Pharmacist for  dosing -For fever or aces or pains- take tylenol or ibuprofen appropriate for age and weight.  * for fevers greater than 101 orally you may alternate ibuprofen and tylenol every  3 hours. -Throat lozenges if help    Christy Hawks, FNP  

## 2015-03-04 NOTE — Progress Notes (Signed)
Subjective:    Patient ID: Sheila [name], female    DOB: 11-Mar-1940, 75 y.o.   MRN: [id number]  Cough This is a new problem. The current episode started in the past 7 days. The problem has been gradually worsening. The problem occurs every few minutes. The cough is productive of purulent sputum. Associated symptoms include ear congestion, ear pain, headaches, myalgias, postnasal drip, rhinorrhea, shortness of breath and wheezing. Pertinent negatives include no chills, fever, nasal congestion or sore throat. The symptoms are aggravated by lying down. She has tried rest and OTC cough suppressant for the symptoms. The treatment provided mild relief. There is no history of asthma or COPD.      Review of Systems  Constitutional: Negative.  Negative for fever and chills.  HENT: Positive for ear pain, postnasal drip and rhinorrhea. Negative for sore throat.   Eyes: Negative.   Respiratory: Positive for cough, shortness of breath and wheezing.   Cardiovascular: Negative.  Negative for palpitations.  Gastrointestinal: Negative.   Endocrine: Negative.   Genitourinary: Negative.   Musculoskeletal: Positive for myalgias.  Neurological: Positive for headaches.  Hematological: Negative.   Psychiatric/Behavioral: Negative.   All other systems reviewed and are negative.      Objective:   Physical Exam  Constitutional: She is oriented to person, place, and time. She appears well-developed and well-nourished. No distress.  HENT:  Head: Normocephalic and atraumatic.  Right Ear: External ear normal.  Nose: Nose normal.  Mouth/Throat: Oropharynx is clear and moist.  Eyes: Pupils are equal, round, and reactive to light.  Neck: Normal range of motion. Neck supple. No thyromegaly present.  Cardiovascular: Normal rate, regular rhythm, normal heart sounds and intact distal pulses.   No murmur heard. Pulmonary/Chest: Effort normal. No respiratory distress. She has wheezes.  Abdominal: Soft. Bowel  sounds are normal. She exhibits no distension. There is no tenderness.  Musculoskeletal: Normal range of motion. She exhibits no edema or tenderness.  Neurological: She is alert and oriented to person, place, and time. She has normal reflexes. No cranial nerve deficit.  Skin: Skin is warm and dry.  Psychiatric: She has a normal mood and affect. Her behavior is normal. Judgment and thought content normal.  Vitals reviewed.     BP 119/65 mmHg  Pulse 106  Temp(Src) 97.1 F (36.2 C) (Oral)  Ht 5\' 1"  (1.549 m)  Wt 188 lb (85.276 kg)  BMI 35.54 kg/m2     Assessment & Plan:  1. Wheezing - DG Chest 2 View; Future - albuterol (PROVENTIL HFA;VENTOLIN HFA) 108 (90 Base) MCG/ACT inhaler; Inhale 2 puffs into the lungs every 6 (six) hours as needed for wheezing.  Dispense: 1 Inhaler; Refill: 2  2. SOB (shortness of breath) - DG Chest 2 View; Future  3. Low back strain, subsequent encounter -Rest -Sedation precaution discussed - tiZANidine (ZANAFLEX) 2 MG tablet; Take 1 tablet (2 mg total) by mouth every 8 (eight) hours as needed for muscle spasms.  Dispense: 40 tablet; Refill: 1  4. Acute bronchitis, unspecified organism -- Take meds as prescribed - Use a cool mist humidifier  -Use saline nose sprays frequently -Saline irrigations of the nose can be very helpful if done frequently.  * 4X daily for 1 week*  * Use of a nettie pot can be helpful with this. Follow directions with this* -Force fluids -For any cough or congestion  Use plain Mucinex- regular strength or max strength is fine   * Children- consult with Pharmacist for dosing -For  fever or aces or pains- take tylenol or ibuprofen appropriate for age and weight.  * for fevers greater than 101 orally you may alternate ibuprofen and tylenol every  3 hours. -Throat lozenges if help - levofloxacin (LEVAQUIN) 500 MG tablet; Take 1 tablet (500 mg total) by mouth daily.  Dispense: 7 tablet; Refill: 0 - predniSONE (STERAPRED UNI-PAK 21  TAB) 10 MG (21) TBPK tablet; Take 1 tablet (10 mg total) by mouth daily. As directed x 6 days  Dispense: 21 tablet; Refill: 0 - albuterol (PROVENTIL HFA;VENTOLIN HFA) 108 (90 Base) MCG/ACT inhaler; Inhale 2 puffs into the lungs every 6 (six) hours as needed for wheezing.  Dispense: 1 Inhaler; Refill: 2 - benzonatate (TESSALON) 200 MG capsule; Take 1 capsule (200 mg total) by mouth 3 (three) times daily as needed.  Dispense: 30 capsule; Refill: [place], FNP

## 2015-03-04 NOTE — Addendum Note (Signed)
Addended by: [name] A on: 03/04/2015 03:41 PM   Modules accepted: Orders

## 2015-03-05 ENCOUNTER — Other Ambulatory Visit: Payer: Self-pay | Admitting: Family

## 2015-03-05 DIAGNOSIS — IMO0002 Reserved for concepts with insufficient information to code with codable children: Secondary | ICD-10-CM

## 2015-03-06 ENCOUNTER — Ambulatory Visit: Payer: Medicare Other | Admitting: Physical Therapy

## 2015-03-06 ENCOUNTER — Telehealth: Payer: Self-pay | Admitting: Family Medicine

## 2015-03-06 ENCOUNTER — Encounter: Payer: Self-pay | Admitting: Physical Therapy

## 2015-03-06 DIAGNOSIS — M545 Low back pain, unspecified: Secondary | ICD-10-CM

## 2015-03-06 DIAGNOSIS — S39012D Strain of muscle, fascia and tendon of lower back, subsequent encounter: Secondary | ICD-10-CM

## 2015-03-06 NOTE — Telephone Encounter (Signed)
I am okay to send order for more physical therapy for her, right lumbar back pain. Caryl Pina, MD [facility] 03/06/2015, 4:26 PM

## 2015-03-06 NOTE — Therapy (Signed)
[facility] [place], [place], [address] Phone: [phone]   Fax:  [phone]  Physical Therapy Treatment  Patient Details  Name: Sheila [name] MRN: [id number] Date of Birth: 12-28-1940 Referring Provider: Vonna Kotyk Dettinger MD.  Encounter Date: 03/06/2015      PT End of Session - 03/06/15 1304    Visit Number 4   Number of Visits 12   Date for PT Re-Evaluation 03/19/15   PT Start Time 1304   PT Stop Time 1352   PT Time Calculation (min) 48 min   Activity Tolerance Patient tolerated treatment well   Behavior During Therapy [facility] for tasks assessed/performed      Past Medical History  Diagnosis Date  . Hypertension   . Depression   . Hypercholesteremia   . Diverticulosis of colon   . Hypothyroidism   . Chest pain 07/2009    Myoveiw stress test negative.  [name] DJD (degenerative joint disease) of knee   . Bell's palsy   . UTI (lower urinary tract infection)   . Gastric ulcer with hemorrhage 01/14/2011  . Cataract   . History of fractured pelvis     Past Surgical History  Procedure Laterality Date  . Knee surgery  04/2010.    Total left knee replacement  . Esophagogastroduodenoscopy  01/14/2011    Procedure: ESOPHAGOGASTRODUODENOSCOPY (EGD);  Surgeon: Rogene Houston, MD;  Location: [facility];  Service: Endoscopy;  Laterality: N/A;  . Joint replacement    . Hip surgery      pelvis  . Laparoscopic appendectomy N/A 08/01/2013    Procedure: APPENDECTOMY LAPAROSCOPIC;  Surgeon: Jamesetta So, MD;  Location: [facility] ORS;  Service: General;  Laterality: N/A;  . Appendectomy      There were no vitals filed for this visit.  Visit Diagnosis:  Left-sided low back pain without sciatica      Subjective Assessment - 03/06/15 1303    Subjective Reports that pain is now in R hip region and was told that she had a contusion and a cracked bone in her back. Worse in the mornings upon waking per patient report.   Patient Stated  Goals Get out of pain.   Currently in Pain? Yes   Pain Score 8    Pain Location Back   Pain Orientation Right;Lower   Pain Descriptors / Indicators Aching   Pain Type Acute pain   Pain Onset 1 to 4 weeks ago   Pain Frequency Intermittent            [facility] PT Assessment - 03/06/15 0001    Assessment   Medical Diagnosis LB strain.                     [facility] Adult PT Treatment/Exercise - 03/06/15 0001    Exercises   Exercises Lumbar   Lumbar Exercises: Stretches   Single Knee to Chest Stretch 3 reps;30 seconds;Other (comment)  LLE   Lumbar Exercises: Supine   Bent Knee Raise 5 reps;Other (comment)  Reported tightness in L low back   Bridge 20 reps   Straight Leg Raise 20 reps;Other (comment)  LLE   Modalities   Modalities Electrical Stimulation;Moist Heat;Ultrasound   Moist Heat Therapy   Number Minutes Moist Heat 15 Minutes   Moist Heat Location Lumbar Spine   Electrical Stimulation   Electrical Stimulation Location Left low back.   Electrical Stimulation Action Pre-Mod   Electrical Stimulation Parameters 80-150 Hz x15 min   Electrical  Stimulation Goals Pain   Ultrasound   Ultrasound Location L low back    Ultrasound Parameters 1.5 w/cm2, 100%,1 mhz x10 min   Ultrasound Goals Pain   Manual Therapy   Manual Therapy Myofascial release   Myofascial Release MFR to L lumbar paraspinals, QL, posterior hip musculature in R sidelying to decrease tightness                  PT Short Term Goals - 02/05/15 1445    PT SHORT TERM GOAL #1   Title Ind with HEP.   Time 2   Period Weeks   Status New           PT Long Term Goals - 03/06/15 1338    PT LONG TERM GOAL #1   Title Perform ADL's with pain not > 3/10.   Time 8   Period Weeks   Status Achieved   PT LONG TERM GOAL #2   Title Sit 30 minutes with pain not > 3/10.   Time 8   Period Weeks   Status Achieved   PT LONG TERM GOAL #3   Title Stand 20 minutes with pain not > 3/10.   Time 8    Period Weeks   Status On-going  States that she has not been able to stand static long for a long time               Plan - 03/06/15 1339    Clinical Impression Statement Patient tolerated today's treatment well with most complaints regarding the R hip pain she states she is now experiencing. Normal modalities response noted following removal of the modalities. Minimal L QL and lumbar paraspinals tightness noted today during manual therapy. Patient experienced tenderness to palpation during manual therapy over the L lumbar paraspinals. Basic lumbar strengthening exercises were initiated today secondary to L low back not limiting patient as much. Patient completed bridging and SLR exercises well with no reports of pain and with core activation. During supine marching exercise patient experienced tightness in L low back and SKTC was intiiated to decrease tightness. Patient was educated to assess her back pain following today's treatment in order to assess whether to continue exercises. Has achieved LT goals regarding ADLs and sitting goals with pain less than 3/10. Denied low back pain following today's treatment.   Pt will benefit from skilled therapeutic intervention in order to improve on the following deficits Pain;Decreased activity tolerance   Rehab Potential Good   PT Frequency 2x / week   PT Duration 8 weeks   PT Treatment/Interventions ADLs/Self Care Home Management;Moist Heat;Therapeutic exercise;Therapeutic activities;Ultrasound;Manual techniques;Electrical Stimulation;Patient/family education   PT Next Visit Plan Modalities and STW/M to patient's left lumbar region and core exercises.   Consulted and Agree with Plan of Care Patient        Problem List Patient Active Problem List   Diagnosis Date Noted  . BMI 37.0-37.9, adult 10/18/2014  . Neuropathy ([place]) 10/18/2014  . Hypokalemia 10/18/2014  . Depressed 09/05/2013  . Osteoporosis 06/16/2012  . PUD (peptic ulcer disease)  06/16/2012  . GAD (generalized anxiety disorder) 06/16/2012  . Hyperlipidemia 06/16/2012  . DJD (degenerative joint disease) 06/16/2012  . Hypercalcemia 06/16/2012  . Essential hypertension, benign 06/16/2012  . Gastric ulcer with hemorrhage 01/14/2011  . Diverticulosis of colon 01/13/2011  . Prerenal azotemia 01/13/2011  . Leukocytosis 01/13/2011  . Sacral insufficiency fracture 01/13/2011  . Groin pain, left lower quadrant 01/13/2011  . Hypothyroidism 01/13/2011  . Orthostatic lightheadedness  01/13/2011  . CHEST PAIN-PRECORDIAL 07/16/2009  . ABNORMAL EKG 07/16/2009    Wynelle Fanny, PTA 03/06/2015, 1:55 PM  [facility] [place], [place], [address] Phone: [phone]   Fax:  [phone]  Name: CAITLAND WEEMAN MRN: [id number] Date of Birth: 1940-12-16

## 2015-03-06 NOTE — Telephone Encounter (Signed)
Patient requesting a new referral to continue with more physical therapy.

## 2015-03-07 NOTE — Telephone Encounter (Signed)
Message left for pt that continued PT was approved by Dr. Warrick Parisian & referral has been placed.

## 2015-03-13 ENCOUNTER — Encounter: Payer: Self-pay | Admitting: Physical Therapy

## 2015-03-14 ENCOUNTER — Encounter: Payer: Self-pay | Admitting: Family

## 2015-03-14 ENCOUNTER — Ambulatory Visit (INDEPENDENT_AMBULATORY_CARE_PROVIDER_SITE_OTHER): Payer: Medicare Other | Admitting: Family

## 2015-03-14 VITALS — BP 94/66 | HR 81 | Temp 97.4°F | Ht 61.0 in | Wt 187.2 lb

## 2015-03-14 DIAGNOSIS — I952 Hypotension due to drugs: Secondary | ICD-10-CM | POA: Diagnosis not present

## 2015-03-14 DIAGNOSIS — J209 Acute bronchitis, unspecified: Secondary | ICD-10-CM

## 2015-03-14 MED ORDER — BUDESONIDE-FORMOTEROL FUMARATE 80-4.5 MCG/ACT IN AERO: 2.0000 | INHALATION_SPRAY | Freq: Two times a day (BID) | RESPIRATORY_TRACT | Status: DC

## 2015-03-14 MED ORDER — PREDNISONE 10 MG PO TABS: ORAL_TABLET | ORAL | Status: DC

## 2015-03-14 MED ORDER — ALBUTEROL SULFATE (2.5 MG/3ML) 0.083% IN NEBU: 2.5000 mg | INHALATION_SOLUTION | Freq: Four times a day (QID) | RESPIRATORY_TRACT | Status: DC | PRN

## 2015-03-14 MED ORDER — DOXYCYCLINE HYCLATE 100 MG PO TABS: 100.0000 mg | ORAL_TABLET | Freq: Two times a day (BID) | ORAL | Status: DC

## 2015-03-14 NOTE — Patient Instructions (Signed)
Acute Bronchitis Bronchitis is inflammation of the airways that extend from the windpipe into the lungs (bronchi). The inflammation often causes mucus to develop. This leads to a cough, which is the most common symptom of bronchitis.  In acute bronchitis, the condition usually develops suddenly and goes away over time, usually in a couple weeks. Smoking, allergies, and asthma can make bronchitis worse. Repeated episodes of bronchitis may cause further lung problems.  CAUSES Acute bronchitis is most often caused by the same virus that causes a cold. The virus can spread from person to person (contagious) through coughing, sneezing, and touching contaminated objects. SIGNS AND SYMPTOMS   Cough.   Fever.   Coughing up mucus.   Body aches.   Chest congestion.   Chills.   Shortness of breath.   Sore throat.  DIAGNOSIS  Acute bronchitis is usually diagnosed through a physical exam. Your health care provider will also ask you questions about your medical history. Tests, such as chest X-rays, are sometimes done to rule out other conditions.  TREATMENT  Acute bronchitis usually goes away in a couple weeks. Oftentimes, no medical treatment is necessary. Medicines are sometimes given for relief of fever or cough. Antibiotic medicines are usually not needed but may be prescribed in certain situations. In some cases, an inhaler may be recommended to help reduce shortness of breath and control the cough. A cool mist vaporizer may also be used to help thin bronchial secretions and make it easier to clear the chest.  HOME CARE INSTRUCTIONS  Get plenty of rest.   Drink enough fluids to keep your urine clear or pale yellow (unless you have a medical condition that requires fluid restriction). Increasing fluids may help thin your respiratory secretions (sputum) and reduce chest congestion, and it will prevent dehydration.   Take medicines only as directed by your health care provider.  If  you were prescribed an antibiotic medicine, finish it all even if you start to feel better.  Avoid smoking and secondhand smoke. Exposure to cigarette smoke or irritating chemicals will make bronchitis worse. If you are a smoker, consider using nicotine gum or skin patches to help control withdrawal symptoms. Quitting smoking will help your lungs heal faster.   Reduce the chances of another bout of acute bronchitis by washing your hands frequently, avoiding people with cold symptoms, and trying not to touch your hands to your mouth, nose, or eyes.   Keep all follow-up visits as directed by your health care provider.  SEEK MEDICAL CARE IF: Your symptoms do not improve after 1 week of treatment.  SEEK IMMEDIATE MEDICAL CARE IF:  You develop an increased fever or chills.   You have chest pain.   You have severe shortness of breath.  You have bloody sputum.   You develop dehydration.  You faint or repeatedly feel like you are going to pass out.  You develop repeated vomiting.  You develop a severe headache. MAKE SURE YOU:   Understand these instructions.  Will watch your condition.  Will get help right away if you are not doing well or get worse.   This information is not intended to replace advice given to you by your health care provider. Make sure you discuss any questions you have with your health care provider.   Document Released: 03/19/2004 Document Revised: 03/02/2014 Document Reviewed: 08/02/2012 Elsevier Interactive Patient Education 2016 [employer]  - Take meds as prescribed - Use a cool mist humidifier  -Use saline nose sprays frequently -Saline   irrigations of the nose can be very helpful if done frequently.  * 4X daily for 1 week*  * Use of a nettie pot can be helpful with this. Follow directions with this* -Force fluids -For any cough or congestion  Use plain Mucinex- regular strength or max strength is fine   * Children- consult with Pharmacist for  dosing -For fever or aces or pains- take tylenol or ibuprofen appropriate for age and weight.  * for fevers greater than 101 orally you may alternate ibuprofen and tylenol every  3 hours. -Throat lozenges if help    Christy Hawks, FNP  

## 2015-03-14 NOTE — Progress Notes (Signed)
Subjective:    Patient ID: Sheila [name], female    DOB: [birth year], 75 y.o.   MRN: [id number]  PT presents to the office today to recheck acute bronchitis. Pt was given rx of prednisone, levofloxacin. Pt states her coughing is the same or worse. PT states she continues to take the flonase and tessalon pearls. Pt denies smoking, but does "dip snuff".    Cough This is a recurrent problem. The current episode started 1 to 4 weeks ago. The problem has been waxing and waning. The problem occurs every few minutes. The cough is productive of sputum. Associated symptoms include chest pain, ear congestion, ear pain, myalgias, shortness of breath and wheezing. Pertinent negatives include no headaches, rhinorrhea or sore throat. Associated symptoms comments: Sinus pressure . The symptoms are aggravated by lying down. She has tried rest, OTC cough suppressant and oral steroids for the symptoms. The treatment provided mild relief.      Review of Systems  Constitutional: Negative.   HENT: Positive for ear pain. Negative for rhinorrhea and sore throat.   Eyes: Negative.   Respiratory: Positive for cough, shortness of breath and wheezing.   Cardiovascular: Positive for chest pain. Negative for palpitations.  Gastrointestinal: Negative.   Endocrine: Negative.   Genitourinary: Negative.   Musculoskeletal: Positive for myalgias.  Neurological: Negative.  Negative for headaches.  Hematological: Negative.   Psychiatric/Behavioral: Negative.   All other systems reviewed and are negative.      Objective:   Physical Exam  Constitutional: She is oriented to person, place, and time. She appears well-developed and well-nourished. No distress.  HENT:  Head: Normocephalic and atraumatic.  Right Ear: External ear normal.  Left Ear: External ear normal.  Eyes: Pupils are equal, round, and reactive to light.  Neck: Normal range of motion. Neck supple. No thyromegaly present.  Cardiovascular: Normal rate,  regular rhythm, normal heart sounds and intact distal pulses.   No murmur heard. Pulmonary/Chest: Effort normal. No respiratory distress. She has wheezes.  Constant Coarse nonproductive cough  Abdominal: Soft. Bowel sounds are normal. She exhibits no distension. There is no tenderness.  Musculoskeletal: Normal range of motion. She exhibits no edema or tenderness.  Neurological: She is alert and oriented to person, place, and time. She has normal reflexes. No cranial nerve deficit.  Skin: Skin is warm and dry.  Psychiatric: She has a normal mood and affect. Her behavior is normal. Judgment and thought content normal.  Vitals reviewed.   Blood pressure 94/66, pulse 81, temperature 97.4 F (36.3 C), temperature source Oral, height 5\' 1"  (1.549 m), weight 187 lb 3.2 oz (84.913 kg), SpO2 98 %. s      Assessment & Plan:  1. Acute bronchitis, unspecified organism - Take meds as prescribed - Use a cool mist humidifier  -Use saline nose sprays frequently -Saline irrigations of the nose can be very helpful if done frequently.  * 4X daily for 1 week*  * Use of a nettie pot can be helpful with this. Follow directions with this* -Force fluids -For any cough or congestion  Use plain Mucinex- regular strength or max strength is fine   * Children- consult with Pharmacist for dosing -For fever or aces or pains- take tylenol or ibuprofen appropriate for age and weight.  * for fevers greater than 101 orally you may alternate ibuprofen and tylenol every  3 hours. -Throat lozenges if help -RTO in 2 weeks - budesonide-formoterol (SYMBICORT) 80-4.5 MCG/ACT inhaler; Inhale 2 puffs into the lungs  2 (two) times daily.  Dispense: 1 Inhaler; Refill: 3 - albuterol (PROVENTIL) (2.5 MG/3ML) 0.083% nebulizer solution; Take 3 mLs (2.5 mg total) by nebulization every 6 (six) hours as needed for wheezing or shortness of breath.  Dispense: 150 mL; Refill: 1 - DME Nebulizer machine - predniSONE (DELTASONE) 10 MG  tablet; Take 5 tabs for 3 days, 4 tabs for 3 days, 3 tabs for 3 days, 2 tabs for 2 days, 1 tab for one day  Dispense: 41 tablet; Refill: 0 - doxycycline (VIBRA-TABS) 100 MG tablet; Take 1 tablet (100 mg total) by mouth 2 (two) times daily.  Dispense: 20 tablet; Refill: 0  2. Hypotension due to drugs -Falls precaution discussed Pt to stop Verapamil 120 mg daily.  RTO in 2 weeks  [name], FNP

## 2015-03-19 ENCOUNTER — Telehealth: Payer: Self-pay | Admitting: Family

## 2015-03-19 NOTE — Telephone Encounter (Signed)
Faxed order ov notes and demo to [facility], pt aware

## 2015-03-20 ENCOUNTER — Ambulatory Visit: Payer: Medicare Other | Admitting: Physical Therapy

## 2015-03-20 ENCOUNTER — Encounter: Payer: Self-pay | Admitting: Physical Therapy

## 2015-03-20 DIAGNOSIS — M545 Low back pain, unspecified: Secondary | ICD-10-CM

## 2015-03-20 NOTE — Patient Instructions (Signed)
Pelvic Tilt: Posterior - Legs Bent (Supine)   Tighten stomach and flatten back by rolling pelvis down. Hold _10___ seconds. Relax. Repeat _10-30___ times per set. Do __2__ sets per session. Do _2___ sessions per day.   .  Self-Mobilization: Heel Slide (Supine)   Hold draw in and Slide left/Right heel toward buttocks until a gentle stretch is felt. Hold _5___ seconds. Relax. Repeat _10___ times per set. Do _2-3___ sets per session. Do _2___ sessions per day.  Bent Leg Lift (Hook-Lying)   Tighten stomach and slowly raise right leg _5___ inches from floor. Keep trunk rigid. Hold _3___ seconds. Repeat _10___ times per set. Do ___2-3_ sets per session. Do __2__ sessions per day.   Straight Leg Raise  Tighten stomach and slowly raise locked right leg __4__ inches from floor. Repeat __10-30__ times per set. Do __2__ sets per session. Do __2__ sessions per day.  Bridging  Slowly raise buttocks from floor, keeping stomach tight. Repeat _10___ times per set. Do __2__ sets per session. Do __2__ sessions per day.     Scapular Retraction: Bilateral  Facing anchor, pull arms back, bringing shoulder blades together. Repeat _30___ times per set. Do __2-3__ sets per session. Do _2___ sessions per day.

## 2015-03-20 NOTE — Therapy (Signed)
[facility] [place], [place], [address] Phone: [phone]   Fax:  [phone]  Physical Therapy Treatment  Patient Details  Name: Sheila [name] MRN: [id number] Date of Birth: [birth year] Referring Provider: Vonna Kotyk Dettinger MD.  Encounter Date: 03/20/2015      PT End of Session - 03/20/15 1426    Visit Number 5   Number of Visits 12   Date for PT Re-Evaluation 03/19/15   PT Start Time 1400   PT Stop Time 1439   PT Time Calculation (min) 39 min   Activity Tolerance Patient tolerated treatment well   Behavior During Therapy [facility] for tasks assessed/performed      Past Medical History  Diagnosis Date  . Hypertension   . Depression   . Hypercholesteremia   . Diverticulosis of colon   . Hypothyroidism   . Chest pain 07/2009    Myoveiw stress test negative.  [name] DJD (degenerative joint disease) of knee   . Bell's palsy   . UTI (lower urinary tract infection)   . Gastric ulcer with hemorrhage 01/14/2011  . Cataract   . History of fractured pelvis     Past Surgical History  Procedure Laterality Date  . Knee surgery  04/2010.    Total left knee replacement  . Esophagogastroduodenoscopy  01/14/2011    Procedure: ESOPHAGOGASTRODUODENOSCOPY (EGD);  Surgeon: Rogene Houston, MD;  [facility];  Service: Endoscopy;  Laterality: N/A;  . Joint replacement    . Hip surgery      pelvis  . Laparoscopic appendectomy N/A 08/01/2013    Procedure: APPENDECTOMY LAPAROSCOPIC;  Surgeon: Jamesetta So, MD;  Location: [facility] ORS;  Service: General;  Laterality: N/A;  . Appendectomy      There were no vitals filed for this visit.  Visit Diagnosis:  Left-sided low back pain without sciatica      Subjective Assessment - 03/20/15 1413    Subjective Felt better after last treatment and has had no pain since last treatment   Currently in Pain? No/denies                         [facility] Adult PT Treatment/Exercise  - 03/20/15 0001    Lumbar Exercises: Stretches   Single Knee to Chest Stretch 3 reps;30 seconds;Other (comment)  bil   Lumbar Exercises: Seated   Sit to Stand 20 reps   Sit to Stand Limitations --  seated scap retraction with draw in/yellow t-band 2x10   Lumbar Exercises: Supine   Ab Set --  draw in 2x10 30 sec hold   Glut Set 20 reps;5 seconds   Heel Slides --  draw ins 2x10 each LE with holds   Bent Knee Raise --  draw ins 2x10 each LE with holds   Bridge 20 reps   Straight Leg Raise --  draw ins 2x10 each LE with holds   Other Supine Lumbar Exercises UE/LE alt with draw ins 3 x10 with holds                PT Education - 03/20/15 1426    Education provided Yes   Education Details HEP/posture awareness techniques   Person(s) Educated Patient   Methods Explanation;Demonstration;Handout;Verbal cues   Comprehension Verbalized understanding;Returned demonstration          PT Short Term Goals - 03/20/15 1439    PT SHORT TERM GOAL #1   Title Ind with HEP.   Time 2  Period Weeks   Status Achieved           PT Long Term Goals - 03/20/15 1439    PT LONG TERM GOAL #1   Title Perform ADL's with pain not > 3/10.   Time 8   Period Weeks   Status Achieved   PT LONG TERM GOAL #2   Title Sit 30 minutes with pain not > 3/10.   Time 8   Period Weeks   Status Achieved   PT LONG TERM GOAL #3   Title Stand 20 minutes with pain not > 3/10.   Time 8   Period Weeks   Status Achieved               Plan - 03/20/15 1436    Clinical Impression Statement Patient has reported no pain since last treatment and would like to be put on hold. Pateint was given HEP for core strengthening and educated patient on posture awareness techiques to avoid pain in back. patient has met all current goals. no antalgic gait. 5th visit FOTO (39% limit)    Pt will benefit from skilled therapeutic intervention in order to improve on the following deficits Pain;Decreased activity  tolerance   Rehab Potential Good   PT Frequency 2x / week   PT Duration 8 weeks   PT Treatment/Interventions ADLs/Self Care Home Management;Moist Heat;Therapeutic exercise;Therapeutic activities;Ultrasound;Manual techniques;Electrical Stimulation;Patient/family education   PT Next Visit Plan on hold per patient/MPT   Consulted and Agree with Plan of Care Patient        Problem List Patient Active Problem List   Diagnosis Date Noted  . BMI 37.0-37.9, adult 10/18/2014  . Neuropathy ([place]) 10/18/2014  . Hypokalemia 10/18/2014  . Depressed 09/05/2013  . Osteoporosis 06/16/2012  . PUD (peptic ulcer disease) 06/16/2012  . GAD (generalized anxiety disorder) 06/16/2012  . Hyperlipidemia 06/16/2012  . DJD (degenerative joint disease) 06/16/2012  . Hypercalcemia 06/16/2012  . Essential hypertension, benign 06/16/2012  . Gastric ulcer with hemorrhage 01/14/2011  . Diverticulosis of colon 01/13/2011  . Prerenal azotemia 01/13/2011  . Leukocytosis 01/13/2011  . Sacral insufficiency fracture 01/13/2011  . Groin pain, left lower quadrant 01/13/2011  . Hypothyroidism 01/13/2011  . Orthostatic lightheadedness 01/13/2011  . CHEST PAIN-PRECORDIAL 07/16/2009  . ABNORMAL EKG 07/16/2009    Eulalia Ellerman P, PTA 03/20/2015, 2:40 PM  Ladean Raya, PTA 03/20/2015 2:42 PM  [facility] [place], [place], [address] Phone: [phone]   Fax:  [phone]  Name: Sheila [name] MRN: [id number] Date of Birth: 1940-04-20

## 2015-03-21 ENCOUNTER — Other Ambulatory Visit: Payer: Self-pay | Admitting: Family

## 2015-03-21 DIAGNOSIS — J40 Bronchitis, not specified as acute or chronic: Secondary | ICD-10-CM | POA: Diagnosis not present

## 2015-03-21 DIAGNOSIS — S22000A Wedge compression fracture of unspecified thoracic vertebra, initial encounter for closed fracture: Secondary | ICD-10-CM

## 2015-03-21 DIAGNOSIS — J209 Acute bronchitis, unspecified: Secondary | ICD-10-CM | POA: Diagnosis not present

## 2015-03-22 ENCOUNTER — Other Ambulatory Visit: Payer: Self-pay | Admitting: Family

## 2015-03-22 ENCOUNTER — Other Ambulatory Visit: Payer: Self-pay | Admitting: Nurse Practitioner

## 2015-03-22 MED ORDER — TRAMADOL HCL 50 MG PO TABS: 100.0000 mg | ORAL_TABLET | Freq: Four times a day (QID) | ORAL | Status: DC | PRN

## 2015-03-22 NOTE — Telephone Encounter (Signed)
Last seen 03/14/15, last filled 03/04/15. The quantity doesn't make the directions? She is always reqeusting too early.

## 2015-03-22 NOTE — Telephone Encounter (Signed)
Aware, script pain is ready.

## 2015-03-22 NOTE — Telephone Encounter (Signed)
RX ready for pick up 

## 2015-03-28 ENCOUNTER — Ambulatory Visit (INDEPENDENT_AMBULATORY_CARE_PROVIDER_SITE_OTHER): Payer: Medicare Other | Admitting: Family

## 2015-03-28 ENCOUNTER — Encounter: Payer: Self-pay | Admitting: Family

## 2015-03-28 VITALS — BP 129/83 | HR 95 | Temp 97.5°F | Ht 61.0 in

## 2015-03-28 DIAGNOSIS — Z09 Encounter for follow-up examination after completed treatment for conditions other than malignant neoplasm: Secondary | ICD-10-CM

## 2015-03-28 DIAGNOSIS — I10 Essential (primary) hypertension: Secondary | ICD-10-CM

## 2015-03-28 DIAGNOSIS — J209 Acute bronchitis, unspecified: Secondary | ICD-10-CM

## 2015-03-28 DIAGNOSIS — B372 Candidiasis of skin and nail: Secondary | ICD-10-CM | POA: Diagnosis not present

## 2015-03-28 MED ORDER — NYSTATIN 100000 UNIT/GM EX POWD: CUTANEOUS | Status: DC

## 2015-03-28 NOTE — Patient Instructions (Signed)
Cutaneous Candidiasis °Cutaneous candidiasis is a condition in which there is an overgrowth of yeast (candida) on the skin. Yeast normally live on the skin, but in small enough numbers not to cause any symptoms. In certain cases, increased growth of the yeast may cause an actual yeast infection. This kind of infection usually occurs in areas of the skin that are constantly warm and moist, such as the armpits or the groin. Yeast is the most common cause of diaper rash in babies and in people who cannot control their bowel movements (incontinence). °CAUSES  °The fungus that most often causes cutaneous candidiasis is Candida albicans. Conditions that can increase the risk of getting a yeast infection of the skin include: °· Obesity. °· Pregnancy. °· Diabetes. °· Taking antibiotic medicine. °· Taking birth control pills. °· Taking steroid medicines. °· Thyroid disease. °· An iron or zinc deficiency. °· Problems with the immune system. °SYMPTOMS  °· Red, swollen area of the skin. °· Bumps on the skin. °· Itchiness. °DIAGNOSIS  °The diagnosis of cutaneous candidiasis is usually based on its appearance. Light scrapings of the skin may also be taken and viewed under a microscope to identify the presence of yeast. °TREATMENT  °Antifungal creams may be applied to the infected skin. In severe cases, oral medicines may be needed.  °HOME CARE INSTRUCTIONS  °· Keep your skin clean and dry. °· Maintain a healthy weight. °· If you have diabetes, keep your blood sugar under control. °SEEK IMMEDIATE MEDICAL CARE IF: °· Your rash continues to spread despite treatment. °· You have a fever, chills, or abdominal pain. °  °This information is not intended to replace advice given to you by your health care provider. Make sure you discuss any questions you have with your health care provider. °  °Document Released: 10/28/2010 Document Revised: 05/04/2011 Document Reviewed: 08/13/2014 °Elsevier Interactive Patient Education ©2016 Elsevier  Inc. ° °

## 2015-03-28 NOTE — Progress Notes (Signed)
   Subjective:    Patient ID: Sheila [name], female    DOB: [birth year], 75 y.o.   MRN: [id number]  HPI PT presents to the office today to recheck Acute Bronchitis and hypotension. Pt was given rx of doxycycline 100 mg BID for 10 days, prednisone, albuterol, and started on Symbicort for COPD. PT's verapamil 120 mg was stopped. PT BP is WNL today. PT denies any productive cough or wheezing at this time. PT states "every now and then I will have to cough, but nothing like I was". Pt denies any headache, palpitations, SOB, chest pain or edema at this time.     Review of Systems  Constitutional: Negative.   HENT: Negative.   Eyes: Negative.   Respiratory: Negative.  Negative for shortness of breath.   Cardiovascular: Negative.  Negative for palpitations.  Gastrointestinal: Negative.   Endocrine: Negative.   Genitourinary: Negative.   Musculoskeletal: Negative.   Neurological: Negative.  Negative for headaches.  Hematological: Negative.   Psychiatric/Behavioral: Negative.   All other systems reviewed and are negative.      Objective:   Physical Exam  Constitutional: She is oriented to person, place, and time. She appears well-developed and well-nourished. No distress.  HENT:  Head: Normocephalic and atraumatic.  Right Ear: External ear normal.  Left Ear: External ear normal.  Nose: Nose normal.  Mouth/Throat: Oropharynx is clear and moist.  Eyes: Pupils are equal, round, and reactive to light.  Neck: Normal range of motion. Neck supple. No thyromegaly present.  Cardiovascular: Normal rate, regular rhythm, normal heart sounds and intact distal pulses.   No murmur heard. Pulmonary/Chest: Effort normal and breath sounds normal. No respiratory distress. She has no wheezes.  Abdominal: Soft. Bowel sounds are normal. She exhibits no distension. There is no tenderness.  Musculoskeletal: Normal range of motion. She exhibits no edema or tenderness.  Neurological: She is alert and oriented to  person, place, and time. She has normal reflexes. No cranial nerve deficit.  Skin: Skin is warm and dry.  Erythemas yeast like rash under panniculus  Psychiatric: She has a normal mood and affect. Her behavior is normal. Judgment and thought content normal.  Vitals reviewed.     BP 129/83 mmHg  Pulse 95  Temp(Src) 97.5 F (36.4 C) (Oral)  Ht 5\' 1"  (1.549 m)     Assessment & Plan:  1. Essential hypertension, benign -All blood pressure medications stopped other than lasix for edema --Daily blood pressure log given with instructions on how to fill out and told to bring to next visit -Dash diet information given -Exercise encouraged - Stress Management  -Continue current meds   2. Yeast infection of the skin -Keep clean and dry -Do not scratch - nystatin (MYCOSTATIN/NYSTOP) 100000 UNIT/GM POWD; Apply to abdomen or groin as needed  Dispense: 60 g; Refill: 3  3. Acute bronchitis, unspecified organism -Continue Symbicort  -Resolved -RTO prn   4. Follow-up exam

## 2015-04-02 ENCOUNTER — Ambulatory Visit (INDEPENDENT_AMBULATORY_CARE_PROVIDER_SITE_OTHER): Payer: Medicare Other | Admitting: Family Medicine

## 2015-04-02 ENCOUNTER — Encounter: Payer: Self-pay | Admitting: Family Medicine

## 2015-04-02 ENCOUNTER — Ambulatory Visit (INDEPENDENT_AMBULATORY_CARE_PROVIDER_SITE_OTHER): Payer: Medicare Other

## 2015-04-02 VITALS — BP 92/62 | HR 120 | Temp 98.7°F | Ht 61.0 in | Wt 184.8 lb

## 2015-04-02 DIAGNOSIS — M47812 Spondylosis without myelopathy or radiculopathy, cervical region: Secondary | ICD-10-CM

## 2015-04-02 DIAGNOSIS — M436 Torticollis: Secondary | ICD-10-CM

## 2015-04-02 MED ORDER — BETAMETHASONE SOD PHOS & ACET 6 (3-3) MG/ML IJ SUSP
6.0000 mg | Freq: Once | INTRAMUSCULAR | Status: AC
  Administered 2015-04-02: 6 mg via INTRAMUSCULAR

## 2015-04-02 NOTE — Patient Instructions (Signed)
Take cyclobenzaprine and diclofenac regularly. They should help. Apply heat for 20 minutes at a time several times a day. Be sure to remove the heating pad or other source of heat before it has the chance to burn the skin.

## 2015-04-02 NOTE — Progress Notes (Signed)
Subjective:  Patient ID: Sheila [name], female    DOB: 1940/11/24  Age: 75 y.o. MRN: [id number]  CC: Neck Pain   HPI KIMIMILA CAMMON presents for stiffness and pain at neck with movement. Moderately severe. Worse with rotation either way. Points to mastoid area and nuchal ridge down to the trapezius superior border. No fever, chills, Onset on awakening 2 days ago.   History Libertee has a past medical history of Hypertension; Depression; Hypercholesteremia; Diverticulosis of colon; Hypothyroidism; Chest pain (07/2009); DJD (degenerative joint disease) of knee; Bell's palsy; UTI (lower urinary tract infection); Gastric ulcer with hemorrhage (01/14/2011); Cataract; and History of fractured pelvis.   She has past surgical history that includes Knee surgery (04/2010.); Esophagogastroduodenoscopy (01/14/2011); Joint replacement; Hip surgery; laparoscopic appendectomy (N/A, 08/01/2013); and Appendectomy.   Her family history includes Alzheimer's disease in her sister; Aneurysm in her mother; Cancer in her brother, brother, and father; Heart disease in her sister; Hypertension in her mother; Stroke in her mother.She reports that she has quit smoking. Her smokeless tobacco use includes Snuff. She reports that she does not drink alcohol or use illicit drugs.    ROS Review of Systems  Constitutional: Negative for fever, activity change and appetite change.  HENT: Negative for congestion, rhinorrhea and sore throat.   Eyes: Negative for visual disturbance.  Respiratory: Negative for cough and shortness of breath.   Cardiovascular: Negative for chest pain and palpitations.  Genitourinary: Negative for dysuria.  Musculoskeletal: Positive for myalgias, back pain, arthralgias and neck pain.    Objective:  BP 92/62 mmHg  Pulse 120  Temp(Src) 98.7 F (37.1 C) (Oral)  Ht 5\' 1"  (1.549 m)  Wt 184 lb 12.8 oz (83.825 kg)  BMI 34.94 kg/m2  SpO2 94%  BP Readings from Last 3 Encounters:  04/02/15 92/62    03/28/15 129/83  03/14/15 94/66    Wt Readings from Last 3 Encounters:  04/02/15 184 lb 12.8 oz (83.825 kg)  03/14/15 187 lb 3.2 oz (84.913 kg)  03/04/15 188 lb (85.276 kg)     Physical Exam  Constitutional: She is oriented to person, place, and time. She appears well-developed and well-nourished. No distress.  HENT:  Head: Normocephalic and atraumatic.  Eyes: Conjunctivae and EOM are normal. Pupils are equal, round, and reactive to light.  Neck: Normal range of motion. Neck supple.  Cardiovascular: Normal rate, regular rhythm and normal heart sounds.   No murmur heard. Pulmonary/Chest: Effort normal and breath sounds normal. No respiratory distress. She has no wheezes. She has no rales.  Abdominal: Soft. Bowel sounds are normal. She exhibits no distension. There is no tenderness.  Musculoskeletal: She exhibits tenderness. She exhibits no edema.       Cervical back: She exhibits decreased range of motion, tenderness and bony tenderness.       Lumbar back: She exhibits normal range of motion, no tenderness, no deformity, no spasm and normal pulse.  Neurological: She is alert and oriented to person, place, and time. She has normal reflexes.  Skin: Skin is warm and dry.  Psychiatric: She has a normal mood and affect. Her behavior is normal. Thought content normal.     Lab Results  Component Value Date   WBC 10.0 12/27/2013   HGB 13.3 12/27/2013   HCT 39.6 12/27/2013   PLT 240 08/02/2013   GLUCOSE 103* 12/31/2014   CHOL 144 10/18/2014   TRIG 153* 10/18/2014   HDL 51 10/18/2014   LDLCALC 62 10/18/2014   ALT 19 12/31/2014  AST 18 12/31/2014   NA 139 12/31/2014   K 4.4 12/31/2014   CL 98 12/31/2014   CREATININE 0.76 12/31/2014   BUN 18 12/31/2014   CO2 25 12/31/2014   TSH 3.870 10/18/2014   INR 1.00 02/09/2011   HGBA1C 4.9% 11/15/2012    Dg Foot Complete Left  12/29/2014  CLINICAL DATA:  Patient dropped an object on the foot today. Complaining of pain, swelling and  bruising. EXAM: LEFT FOOT - COMPLETE 3+ VIEW COMPARISON:  None. FINDINGS: There is small piece of bone at the base of the metatarsal could reflect an avulsion fracture, but is more likely a chronic finding. No other evidence of a fracture.  Joints are normally aligned. There is soft tissue swelling that predominates over the dorsal forefoot. There is a small plantar calcaneal spur. IMPRESSION: 1. Possible small nondisplaced avulsion fracture from the base of the fifth metatarsal. If there is no point tenderness in this region, this should be considered chronic. 2. No other evidence of a fracture.  No dislocation. Electronically Signed   By: Lajean Manes M.D.   On: 12/29/2014 22:54    Assessment & Plan:   Zyva was seen today for neck pain.  Diagnoses and all orders for this visit:  Torticollis, acquired -     DG Cervical Spine Complete; Future -     betamethasone acetate-betamethasone sodium phosphate (CELESTONE) injection 6 mg; Inject 1 mL (6 mg total) into the muscle once.  Cervical spondylosis without myelopathy -     betamethasone acetate-betamethasone sodium phosphate (CELESTONE) injection 6 mg; Inject 1 mL (6 mg total) into the muscle once.    Take cyclobenzaprine and diclofenac regularly. They should help. Apply heat for 20 minutes at a time several times a day. Be sure to remove the heating pad or other source of heat before it has the chance to burn the skin.  I have discontinued Ms. Demarest's doxycycline. I am also having her maintain her estradiol, acetaminophen, Multiple Vitamins-Minerals (CENTRUM SILVER ADULT 50+ PO), Omega-3 Fatty Acids (FISH OIL PO), clobetasol cream, gabapentin, furosemide, potassium chloride SA, omeprazole, citalopram, levothyroxine, azelastine, fluticasone, denosumab, hydroxypropyl methylcellulose / hypromellose, ALPRAZolam, diclofenac sodium, albuterol, cyclobenzaprine, budesonide-formoterol, albuterol, atorvastatin, traMADol, and nystatin. We will continue to  administer betamethasone acetate-betamethasone sodium phosphate.  Meds ordered this encounter  Medications  . betamethasone acetate-betamethasone sodium phosphate (CELESTONE) injection 6 mg    Sig:    Take cyclobenzaprine and diclofenac regularly. They should help. Apply heat for 20 minutes at a time several times a day. Be sure to remove the heating pad or other source of heat before it has the chance to burn the skin.   Follow-up: No Follow-up on file.  Claretta Fraise, M.D.

## 2015-04-08 ENCOUNTER — Ambulatory Visit (INDEPENDENT_AMBULATORY_CARE_PROVIDER_SITE_OTHER): Payer: Medicare Other | Admitting: Orthopedic Surgery

## 2015-04-08 VITALS — BP 133/83 | HR 100 | Ht 61.0 in | Wt 184.0 lb

## 2015-04-08 DIAGNOSIS — S93402S Sprain of unspecified ligament of left ankle, sequela: Secondary | ICD-10-CM | POA: Diagnosis not present

## 2015-04-08 NOTE — Progress Notes (Signed)
Patient ID: Sheila [name], female   DOB: 06-Jul-1940, 75 y.o.   MRN: [id number]  ESTABLISHED PATIENT NEW PROBLEM   Chief Complaint  Patient presents with  . Foot Pain    lEFT FOOT PAIN, XRAYS AT .     Sheila [name] is a 75 y.o. female.   HPI 75 years old seen in December for avulsion fracture fifth medical tarsal did well comes in complaining of anterolateral ankle pain. Pain is been present for several weeks described as dull ache severity moderate underaeration as stated timing constant worse with weightbearing  Review of systems neurologic normal skin normal constitutional no fever Review of Systems See hpi  Past Medical History  Diagnosis Date  . Hypertension   . Depression   . Hypercholesteremia   . Diverticulosis of colon   . Hypothyroidism   . Chest pain 07/2009    Myoveiw stress test negative.  [name] DJD (degenerative joint disease) of knee   . Bell's palsy   . UTI (lower urinary tract infection)   . Gastric ulcer with hemorrhage 01/14/2011  . Cataract   . History of fractured pelvis     Past Surgical History  Procedure Laterality Date  . Knee surgery  04/2010.    Total left knee replacement  . Esophagogastroduodenoscopy  01/14/2011    Procedure: ESOPHAGOGASTRODUODENOSCOPY (EGD);  Surgeon: Rogene Houston, MD;  Location: [facility];  Service: Endoscopy;  Laterality: N/A;  . Joint replacement    . Hip surgery      pelvis  . Laparoscopic appendectomy N/A 08/01/2013    Procedure: APPENDECTOMY LAPAROSCOPIC;  Surgeon: Jamesetta So, MD;  Location: [facility];  Service: General;  Laterality: N/A;  . Appendectomy      Family History  Problem Relation Age of Onset  . Aneurysm Mother   . Stroke Mother   . Hypertension Mother   . Cancer Father     lung  . Heart disease Sister   . Cancer Brother   . Alzheimer's disease Sister   . Cancer Brother     Social History Social History  Substance Use Topics  . Smoking status: Former Research scientist (life sciences)  . Smokeless tobacco:  Current User    Types: Snuff     Comment: quit yrs and yrs ago when she was very young  . Alcohol Use: No    Allergies  Allergen Reactions  . Benadryl [Diphenhydramine Hcl] Other (See Comments)    Worsening depression   . Codeine Nausea And Vomiting  . Diphenhydramine Hcl Other (See Comments)    Worsening depression  . Ibuprofen     History of bleeding ulcers - contra-indicated   . Morphine And Related Nausea And Vomiting    Current Outpatient Prescriptions  Medication Sig Dispense Refill  . acetaminophen (TYLENOL) 650 MG CR tablet Take 1,300 mg by mouth every 8 (eight) hours as needed for pain.     [name] albuterol (PROVENTIL HFA;VENTOLIN HFA) 108 (90 Base) MCG/ACT inhaler Inhale 2 puffs into the lungs every 6 (six) hours as needed for wheezing. 1 Inhaler 2  . albuterol (PROVENTIL) (2.5 MG/3ML) 0.083% nebulizer solution Take 3 mLs (2.5 mg total) by nebulization every 6 (six) hours as needed for wheezing or shortness of breath. 150 mL 1  . ALPRAZolam (XANAX) 0.5 MG tablet TAKE  (1)  TABLET TWICE A DAY. 60 tablet 2  . atorvastatin (LIPITOR) 20 MG tablet TAKE 1 TABLET DAILY 90 tablet 0  . azelastine (ASTELIN) 0.1 % nasal spray USE  1 TO 2 SPRAYS IN EACH NOSTRIL AT BEDTIME 30 mL 4  . budesonide-formoterol (SYMBICORT) 80-4.5 MCG/ACT inhaler Inhale 2 puffs into the lungs 2 (two) times daily. 1 Inhaler 3  . citalopram (CELEXA) 20 MG tablet Take 1 tablet (20 mg total) by mouth daily. 90 tablet 1  . clobetasol cream (TEMOVATE) [id number] % Apply 1 application topically 2 (two) times daily. 30 g 3  . cyclobenzaprine (FLEXERIL) 5 MG tablet Take 1 tablet (5 mg total) by mouth 3 (three) times daily as needed for muscle spasms. 30 tablet 0  . denosumab (PROLIA) 60 MG/ML SOLN injection Inject 60 mg into the skin every 6 (six) months. Bring to office for administration. 1 mL 0  . diclofenac sodium (VOLTAREN) 1 % GEL Apply 2 g topically 4 (four) times daily. 100 g 0  . estradiol (ESTRACE) 0.1 MG/GM vaginal  cream Place 1 Applicatorful vaginally 3 (three) times a week.    . fluticasone (FLONASE) 50 MCG/ACT nasal spray USE 1 TO 2 SPRAYS IN EACH NOSTRIL AT BEDTIME 16 g 4  . furosemide (LASIX) 20 MG tablet Take 1 tablet (20 mg total) by mouth daily. 90 tablet 1  . gabapentin (NEURONTIN) 300 MG capsule TAKE (1) CAPSULE THREE TIMES DAILY. 90 capsule 5  . hydroxypropyl methylcellulose / hypromellose (ISOPTO TEARS / GONIOVISC) 2.5 % ophthalmic solution Place 1 drop into both eyes as needed for dry eyes.    [name] levothyroxine (SYNTHROID, LEVOTHROID) 75 MCG tablet Take 1 tablet (75 mcg total) by mouth daily before breakfast. 90 tablet 1  . Multiple Vitamins-Minerals (CENTRUM SILVER ADULT 50+ PO) Take 1 tablet by mouth every other day.    . nystatin (MYCOSTATIN/NYSTOP) 100000 UNIT/GM POWD Apply to abdomen or groin as needed 60 g 3  . Omega-3 Fatty Acids (FISH OIL PO) Take 2 capsules by mouth 2 (two) times daily.    [name] omeprazole (PRILOSEC) 40 MG capsule TAKE (1) CAPSULE DAILY 90 capsule 1  . potassium chloride SA (K-DUR,KLOR-CON) 20 MEQ tablet Take 1 tablet (20 mEq total) by mouth 2 (two) times daily. 180 tablet 1  . traMADol (ULTRAM) 50 MG tablet Take 2 tablets (100 mg total) by mouth every 6 (six) hours as needed. 120 tablet 0   No current facility-administered medications for this visit.       Physical Exam Blood pressure 133/83, pulse 100, height 5\' 1"  (1.549 m), weight 184 lb (83.462 kg). Physical Exam The patient is well developed well nourished and well groomed. Orientation to person place and time is normal  Mood is pleasant. Ambulatory status is abnormal with a limp and uses a walker Skin remains intact without laceration ulceration or erythema Gross motor exam is intact without atrophy. Muscle tone normal grade 5 motor strength Neurovascular exam remains intact Inspection mild tenderness at the previous fracture avulsion site but primarily tender in the anterolateral gutter Joint range of motion  normal Joint stability is normal    Data Reviewed  I have independently reviewed the radiographs and my interpretation is:  Prior x-rays show avulsion fracture proximal fifth metatarsal   Assessment   Prior metatarsal fracture but recurrent ankle sprain acute on chronic Plan   ASO brace for 6 weeks follow-up 6 weeks

## 2015-04-08 NOTE — Patient Instructions (Signed)
Brace x 1 month

## 2015-04-09 ENCOUNTER — Ambulatory Visit (HOSPITAL_COMMUNITY)
Admission: RE | Admit: 2015-04-09 | Discharge: 2015-04-09 | Disposition: A | Discharge status: Home / Self Care | Payer: Medicare Other | Source: Ambulatory Visit | Attending: Family | Admitting: Family

## 2015-04-09 DIAGNOSIS — X58XXXS Exposure to other specified factors, sequela: Secondary | ICD-10-CM | POA: Diagnosis not present

## 2015-04-09 DIAGNOSIS — M549 Dorsalgia, unspecified: Secondary | ICD-10-CM | POA: Insufficient documentation

## 2015-04-09 DIAGNOSIS — S22000A Wedge compression fracture of unspecified thoracic vertebra, initial encounter for closed fracture: Secondary | ICD-10-CM

## 2015-04-09 DIAGNOSIS — S22000S Wedge compression fracture of unspecified thoracic vertebra, sequela: Secondary | ICD-10-CM | POA: Insufficient documentation

## 2015-04-09 DIAGNOSIS — R296 Repeated falls: Secondary | ICD-10-CM | POA: Diagnosis not present

## 2015-04-09 DIAGNOSIS — S22080A Wedge compression fracture of T11-T12 vertebra, initial encounter for closed fracture: Secondary | ICD-10-CM | POA: Diagnosis not present

## 2015-04-15 ENCOUNTER — Other Ambulatory Visit: Payer: Self-pay | Admitting: *Deleted

## 2015-04-15 ENCOUNTER — Other Ambulatory Visit: Payer: Self-pay | Admitting: Family

## 2015-04-15 ENCOUNTER — Other Ambulatory Visit: Payer: Self-pay | Admitting: Nurse Practitioner

## 2015-04-15 MED ORDER — ALBUTEROL SULFATE (2.5 MG/3ML) 0.083% IN NEBU: INHALATION_SOLUTION | RESPIRATORY_TRACT | Status: DC

## 2015-04-21 DIAGNOSIS — J40 Bronchitis, not specified as acute or chronic: Secondary | ICD-10-CM | POA: Diagnosis not present

## 2015-04-21 DIAGNOSIS — J209 Acute bronchitis, unspecified: Secondary | ICD-10-CM | POA: Diagnosis not present

## 2015-04-22 ENCOUNTER — Other Ambulatory Visit: Payer: Self-pay | Admitting: Family

## 2015-04-22 NOTE — Telephone Encounter (Signed)
Patient aware, tramadol script is ready.  She knows to call [facility] to schedule an appointment for a pain management visit.

## 2015-04-22 NOTE — Telephone Encounter (Signed)
RX ready for pick up. PT needs appt for pain contract  

## 2015-04-22 NOTE — Telephone Encounter (Signed)
Last seen 04/01/14  Dr Livia Snellen  PCP  Alyse Low  If approved print

## 2015-04-23 DIAGNOSIS — M545 Low back pain: Secondary | ICD-10-CM | POA: Diagnosis not present

## 2015-04-23 DIAGNOSIS — M5416 Radiculopathy, lumbar region: Secondary | ICD-10-CM | POA: Diagnosis not present

## 2015-04-29 ENCOUNTER — Ambulatory Visit (INDEPENDENT_AMBULATORY_CARE_PROVIDER_SITE_OTHER): Payer: Medicare Other | Admitting: Family

## 2015-04-29 ENCOUNTER — Encounter: Payer: Self-pay | Admitting: Family

## 2015-04-29 VITALS — BP 113/76 | HR 109 | Temp 98.6°F | Ht 61.75 in | Wt 185.2 lb

## 2015-04-29 DIAGNOSIS — Z79899 Other long term (current) drug therapy: Secondary | ICD-10-CM

## 2015-04-29 DIAGNOSIS — M150 Primary generalized (osteo)arthritis: Secondary | ICD-10-CM | POA: Diagnosis not present

## 2015-04-29 DIAGNOSIS — M549 Dorsalgia, unspecified: Secondary | ICD-10-CM | POA: Diagnosis not present

## 2015-04-29 DIAGNOSIS — M1990 Unspecified osteoarthritis, unspecified site: Secondary | ICD-10-CM | POA: Insufficient documentation

## 2015-04-29 DIAGNOSIS — E8881 Metabolic syndrome: Secondary | ICD-10-CM | POA: Diagnosis not present

## 2015-04-29 DIAGNOSIS — F1120 Opioid dependence, uncomplicated: Secondary | ICD-10-CM | POA: Diagnosis not present

## 2015-04-29 DIAGNOSIS — Z0289 Encounter for other administrative examinations: Secondary | ICD-10-CM

## 2015-04-29 DIAGNOSIS — F1320 Sedative, hypnotic or anxiolytic dependence, uncomplicated: Secondary | ICD-10-CM | POA: Insufficient documentation

## 2015-04-29 DIAGNOSIS — G8929 Other chronic pain: Secondary | ICD-10-CM | POA: Diagnosis not present

## 2015-04-29 DIAGNOSIS — M8949 Other hypertrophic osteoarthropathy, multiple sites: Secondary | ICD-10-CM

## 2015-04-29 DIAGNOSIS — M159 Polyosteoarthritis, unspecified: Secondary | ICD-10-CM

## 2015-04-29 MED ORDER — TRAMADOL HCL 50 MG PO TABS
100.0000 mg | ORAL_TABLET | Freq: Three times a day (TID) | ORAL | Status: DC | PRN
Start: 2015-04-29 — End: 2015-08-19

## 2015-04-29 MED ORDER — TRAMADOL HCL 50 MG PO TABS: ORAL_TABLET | ORAL | Status: DC

## 2015-04-29 MED ORDER — ALPRAZOLAM 0.5 MG PO TABS: ORAL_TABLET | ORAL | Status: DC

## 2015-04-29 MED ORDER — TRAMADOL HCL 50 MG PO TABS
100.0000 mg | ORAL_TABLET | Freq: Four times a day (QID) | ORAL | Status: DC | PRN
Start: 2015-04-29 — End: 2015-07-23

## 2015-04-29 MED ORDER — NYSTATIN-TRIAMCINOLONE 100000-0.1 UNIT/GM-% EX OINT: 1.0000 "application " | TOPICAL_OINTMENT | Freq: Two times a day (BID) | CUTANEOUS | Status: DC

## 2015-04-29 NOTE — Progress Notes (Signed)
Subjective:    Patient ID: Sheila [name], female    DOB: 18-Jul-1940, 75 y.o.   MRN: [id number]  HPI [facility] Controlled Substance Abuse database reviewed- Yes  Depression screen [facility] 2/9 04/29/2015 03/04/2015 10/18/2014 07/06/2014 05/11/2014  Decreased Interest 0 0 2 0 0  Down, Depressed, Hopeless 0 0 2 0 0  PHQ - 2 Score 0 0 4 0 0  Altered sleeping - - 0 - -  Tired, decreased energy - - 3 - -  Change in appetite - - 1 - -  Feeling bad or failure about yourself  - - 1 - -  Trouble concentrating - - 3 - -  Moving slowly or fidgety/restless - - 0 - -  Suicidal thoughts - - 0 - -  PHQ-9 Score - - 12 - -    GAD 7 : Generalized Anxiety Score 04/29/2015 04/29/2015  Nervous, Anxious, on Edge 1 0  Control/stop worrying 0 -  Worry too much - different things 0 -  Trouble relaxing 0 -  Restless 0 -  Easily annoyed or irritable 0 -  Afraid - awful might happen 0 -  Total GAD 7 Score 1 -       [facility] drug screen performed- Yes  SOAPP  0= never  1= seldom  2=sometimes  3= often  4= very often  How often do you have mood swings? 0 How often do you smoke a cigarette within an hour after waling up? 0 How often have you taken medication other than the way that it was prescribed?0 How often have you used illegal drugs in the past 5 years? 0 How often, in your lifetime, have you had legal problems or been arrested? 0  Score 0  Alcohol Audit - How often during the last year have found that you: 0-Never   1- Less than monthly   2- Monthly     3-Weekly     4-daily or almost daily  - found that you were not able to stop drinking once you started- 0 -failed to do what was normally expected of you because of drinking- 0 -needed a first drink in the morning- 0 -had a feeling of guilt or remorse after drinking- 0 -are/were unable to remember what happened the night before because of your drinking- 0  0- NO   2- yes but not in last year  4- yes during last year -Have you or someone else  been injured because of your drinking- 0 - Has anyone been concerned about your drinking or suggested you cut down- 0        TOTAL- 0  ( 0-7- alcohol education, 8-15- simple advice, 16-19 simple advice plus counseling, 20-40 referral for evaluation and treatment 0   Sheila [name]  Pain assessment: Cause of pain- Chronic back pain Pain location- lower back pain and right hip pain Pain on scale of 1-10- 8 Frequency- Constantly What increases pain-bending What makes pain Better-Rest, and pain medications   Pain management agreement reviewed and signed- Yes     Review of Systems  Constitutional: Negative.   HENT: Negative.   Eyes: Negative.   Respiratory: Negative.  Negative for shortness of breath.   Cardiovascular: Negative.  Negative for palpitations.  Gastrointestinal: Negative.   Endocrine: Negative.   Genitourinary: Negative.   Musculoskeletal: Negative.   Neurological: Negative.  Negative for headaches.  Hematological: Negative.   Psychiatric/Behavioral: Negative.   All other systems reviewed and are negative.  Objective:   Physical Exam  Constitutional: She is oriented to person, place, and time. She appears well-developed and well-nourished. No distress.  HENT:  Head: Normocephalic and atraumatic.  Eyes: Pupils are equal, round, and reactive to light.  Neck: Normal range of motion. Neck supple. No thyromegaly present.  Cardiovascular: Normal rate, regular rhythm, normal heart sounds and intact distal pulses.   No murmur heard. Pulmonary/Chest: Effort normal and breath sounds normal. No respiratory distress. She has no wheezes.  Abdominal: Soft. Bowel sounds are normal. She exhibits no distension. There is no tenderness.  Musculoskeletal: Normal range of motion. She exhibits no edema or tenderness.  Neurological: She is alert and oriented to person, place, and time.  Skin: Skin is warm and dry.  Psychiatric: She has a normal mood  and affect. Her behavior is normal. Judgment and thought content normal.  Vitals reviewed.   BP 113/76 mmHg  Pulse 109  Temp(Src) 98.6 F (37 C) (Oral)  Ht 5' 1.75" (1.568 m)  Wt 185 lb 3.2 oz (84.006 kg)  BMI 34.17 kg/m2       Assessment & Plan:  1. Uncomplicated opioid dependence (HCC) - traMADol (ULTRAM) 50 MG tablet; Take 2 tablets (100 mg total) by mouth every 6 (six) hours as needed.  Dispense: 120 tablet; Refill: 0 - traMADol (ULTRAM) 50 MG tablet; Take 2 tablets (100 mg total) by mouth every 8 (eight) hours as needed.  Dispense: 120 tablet; Refill: 0 - traMADol (ULTRAM) 50 MG tablet; TAKE (2) TABLETS EVERY SIX HOURS AS NEEDED.  Dispense: 120 tablet; Refill: 0 - ToxASSURE Select 13 (MW), Urine  2. Pain medication agreement signed - traMADol (ULTRAM) 50 MG tablet; Take 2 tablets (100 mg total) by mouth every 6 (six) hours as needed.  Dispense: 120 tablet; Refill: 0 - traMADol (ULTRAM) 50 MG tablet; Take 2 tablets (100 mg total) by mouth every 8 (eight) hours as needed.  Dispense: 120 tablet; Refill: 0 - traMADol (ULTRAM) 50 MG tablet; TAKE (2) TABLETS EVERY SIX HOURS AS NEEDED.  Dispense: 120 tablet; Refill: 0 - ToxASSURE Select 13 (MW), Urine  3. Chronic back pain - traMADol (ULTRAM) 50 MG tablet; Take 2 tablets (100 mg total) by mouth every 6 (six) hours as needed.  Dispense: 120 tablet; Refill: 0 - traMADol (ULTRAM) 50 MG tablet; Take 2 tablets (100 mg total) by mouth every 8 (eight) hours as needed.  Dispense: 120 tablet; Refill: 0 - traMADol (ULTRAM) 50 MG tablet; TAKE (2) TABLETS EVERY SIX HOURS AS NEEDED.  Dispense: 120 tablet; Refill: 0 - ToxASSURE Select 13 (MW), Urine  4. Primary osteoarthritis involving multiple joints - traMADol (ULTRAM) 50 MG tablet; Take 2 tablets (100 mg total) by mouth every 6 (six) hours as needed.  Dispense: 120 tablet; Refill: 0 - traMADol (ULTRAM) 50 MG tablet; Take 2 tablets (100 mg total) by mouth every 8 (eight) hours as needed.   Dispense: 120 tablet; Refill: 0 - traMADol (ULTRAM) 50 MG tablet; TAKE (2) TABLETS EVERY SIX HOURS AS NEEDED.  Dispense: 120 tablet; Refill: 0 - ToxASSURE Select 13 (MW), Urine  5. Metabolic syndrome   Continue all meds, pain contract signed today and will scan into [facility] pending Health Maintenance reviewed Diet and exercise encouraged RTO 3 months  [name], FNP

## 2015-04-29 NOTE — Patient Instructions (Signed)

## 2015-04-29 NOTE — Addendum Note (Signed)
Addended by: [name] A on: 04/29/2015 03:57 PM   Modules accepted: Orders

## 2015-05-03 ENCOUNTER — Telehealth: Payer: Self-pay | Admitting: *Deleted

## 2015-05-03 MED ORDER — CLOTRIMAZOLE-BETAMETHASONE 1-0.05 % EX CREA: 1.0000 "application " | TOPICAL_CREAM | Freq: Two times a day (BID) | CUTANEOUS | Status: DC

## 2015-05-03 NOTE — Telephone Encounter (Signed)
Clotrimazole cream Prescription sent to pharmacy. Insurance denies Mycolog cream

## 2015-05-03 NOTE — Telephone Encounter (Signed)
Rcvd denial from [facility] for nystatin/triaminolone. Can this be changed to clotrimazole; ketoconazole

## 2015-05-04 LAB — TOXASSURE SELECT 13 (MW), URINE: PDF: 0

## 2015-05-09 ENCOUNTER — Other Ambulatory Visit: Payer: Self-pay | Admitting: Nurse Practitioner

## 2015-05-15 ENCOUNTER — Other Ambulatory Visit: Payer: Self-pay | Admitting: Nurse Practitioner

## 2015-05-19 DIAGNOSIS — J209 Acute bronchitis, unspecified: Secondary | ICD-10-CM | POA: Diagnosis not present

## 2015-05-19 DIAGNOSIS — J40 Bronchitis, not specified as acute or chronic: Secondary | ICD-10-CM | POA: Diagnosis not present

## 2015-05-20 ENCOUNTER — Encounter: Payer: Self-pay | Admitting: Orthopedic Surgery

## 2015-05-20 ENCOUNTER — Ambulatory Visit (INDEPENDENT_AMBULATORY_CARE_PROVIDER_SITE_OTHER): Payer: Medicare Other | Admitting: Orthopedic Surgery

## 2015-05-20 VITALS — BP 108/81 | Ht 61.75 in | Wt 185.0 lb

## 2015-05-20 DIAGNOSIS — S93402S Sprain of unspecified ligament of left ankle, sequela: Secondary | ICD-10-CM

## 2015-05-20 DIAGNOSIS — M545 Low back pain, unspecified: Secondary | ICD-10-CM

## 2015-05-20 DIAGNOSIS — S9032XD Contusion of left foot, subsequent encounter: Secondary | ICD-10-CM | POA: Diagnosis not present

## 2015-05-20 MED ORDER — METHYLPREDNISOLONE ACETATE 40 MG/ML IJ SUSP
80.0000 mg | Freq: Once | INTRAMUSCULAR | Status: AC
  Administered 2015-05-20: 80 mg via INTRAMUSCULAR

## 2015-05-20 NOTE — Progress Notes (Signed)
Chief Complaint  Patient presents with  . Follow-up    follow up left foot   HPI the patient comes in for routine follow-up after injuring her left foot and then having some ankle pain which diagnosis is a delayed acute on chronic sprain. But she now has a new problem  She complains of lower back pain recurrent with left leg normal but right leg sciatica recurrent for the last week or 2 severe worse with sitting no trauma no other associated symptoms other than radiating pain down the right leg.  Review of Systems  Constitutional: Negative for fever and chills.  Gastrointestinal: Positive for diarrhea and constipation.  Genitourinary: Positive for urgency and frequency.  Musculoskeletal: Positive for back pain. Negative for falls.  Neurological: Negative for focal weakness.    Past Medical History  Diagnosis Date  . Hypertension   . Depression   . Hypercholesteremia   . Diverticulosis of colon   . Hypothyroidism   . Chest pain 07/2009    Myoveiw stress test negative.  [name] DJD (degenerative joint disease) of knee   . Bell's palsy   . UTI (lower urinary tract infection)   . Gastric ulcer with hemorrhage 01/14/2011  . Cataract   . History of fractured pelvis     Past Surgical History  Procedure Laterality Date  . Knee surgery  04/2010.    Total left knee replacement  . Esophagogastroduodenoscopy  01/14/2011    Procedure: ESOPHAGOGASTRODUODENOSCOPY (EGD);  Surgeon: Rogene Houston, MD;  Location: [facility];  Service: Endoscopy;  Laterality: N/A;  . Joint replacement    . Hip surgery      pelvis  . Laparoscopic appendectomy N/A 08/01/2013    Procedure: APPENDECTOMY LAPAROSCOPIC;  Surgeon: Jamesetta So, MD;  Location: [facility];  Service: General;  Laterality: N/A;  . Appendectomy     Family History  Problem Relation Age of Onset  . Aneurysm Mother   . Stroke Mother   . Hypertension Mother   . Cancer Father     lung  . Heart disease Sister   . Cancer Brother   .  Alzheimer's disease Sister   . Cancer Brother    Social History  Substance Use Topics  . Smoking status: Former Research scientist (life sciences)  . Smokeless tobacco: Current User    Types: Snuff     Comment: quit yrs and yrs ago when she was very young  . Alcohol Use: No    Current outpatient prescriptions:  .  acetaminophen (TYLENOL) 650 MG CR tablet, Take 1,300 mg by mouth every 8 (eight) hours as needed for pain. , Disp: , Rfl:  .  albuterol (PROVENTIL HFA;VENTOLIN HFA) 108 (90 Base) MCG/ACT inhaler, Inhale 2 puffs into the lungs every 6 (six) hours as needed for wheezing., Disp: 1 Inhaler, Rfl: 2 .  albuterol (PROVENTIL) (2.5 MG/3ML) 0.083% nebulizer solution, Nebulize 1 vial every 6-8 hours as needed for wheezing or shortness of breath., Disp: 180 mL, Rfl: 2 .  ALPRAZolam (XANAX) 0.5 MG tablet, TAKE  (1)  TABLET TWICE A DAY., Disp: 60 tablet, Rfl: 2 .  atorvastatin (LIPITOR) 20 MG tablet, TAKE 1 TABLET DAILY, Disp: 90 tablet, Rfl: 0 .  azelastine (ASTELIN) 0.1 % nasal spray, USE 1 TO 2 SPRAYS IN EACH NOSTRIL AT BEDTIME, Disp: 30 mL, Rfl: 2 .  budesonide-formoterol (SYMBICORT) 80-4.5 MCG/ACT inhaler, Inhale 2 puffs into the lungs 2 (two) times daily., Disp: 1 Inhaler, Rfl: 3 .  citalopram (CELEXA) 20 MG tablet, TAKE 1  TABLET DAILY, Disp: 90 tablet, Rfl: 0 .  clobetasol cream (TEMOVATE) [id number] %, Apply 1 application topically 2 (two) times daily., Disp: 30 g, Rfl: 3 .  clotrimazole-betamethasone (LOTRISONE) cream, Apply 1 application topically 2 (two) times daily., Disp: 45 g, Rfl: 0 .  cyclobenzaprine (FLEXERIL) 5 MG tablet, Take 1 tablet (5 mg total) by mouth 3 (three) times daily as needed for muscle spasms., Disp: 30 tablet, Rfl: 0 .  denosumab (PROLIA) 60 MG/ML SOLN injection, Inject 60 mg into the skin every 6 (six) months. Bring to office for administration., Disp: 1 mL, Rfl: 0 .  diclofenac sodium (VOLTAREN) 1 % GEL, Apply 2 g topically 4 (four) times daily., Disp: 100 g, Rfl: 0 .  estradiol (ESTRACE) 0.1  MG/GM vaginal cream, Place 1 Applicatorful vaginally 3 (three) times a week., Disp: , Rfl:  .  fluticasone (FLONASE) 50 MCG/ACT nasal spray, USE 1 TO 2 SPRAYS IN EACH NOSTRIL AT BEDTIME, Disp: 16 g, Rfl: 4 .  furosemide (LASIX) 20 MG tablet, Take 1 tablet (20 mg total) by mouth daily., Disp: 90 tablet, Rfl: 1 .  gabapentin (NEURONTIN) 300 MG capsule, TAKE (1) CAPSULE THREE TIMES DAILY., Disp: 90 capsule, Rfl: 2 .  hydroxypropyl methylcellulose / hypromellose (ISOPTO TEARS / GONIOVISC) 2.5 % ophthalmic solution, Place 1 drop into both eyes as needed for dry eyes., Disp: , Rfl:  .  levothyroxine (SYNTHROID, LEVOTHROID) 75 MCG tablet, Take 1 tablet (75 mcg total) by mouth daily before breakfast., Disp: 90 tablet, Rfl: 1 .  Multiple Vitamins-Minerals (CENTRUM SILVER ADULT 50+ PO), Take 1 tablet by mouth every other day., Disp: , Rfl:  .  Omega-3 Fatty Acids (FISH OIL PO), Take 2 capsules by mouth 2 (two) times daily., Disp: , Rfl:  .  omeprazole (PRILOSEC) 40 MG capsule, TAKE (1) CAPSULE DAILY, Disp: 90 capsule, Rfl: 1 .  omeprazole (PRILOSEC) 40 MG capsule, TAKE (1) CAPSULE DAILY, Disp: 30 capsule, Rfl: 1 .  potassium chloride SA (K-DUR,KLOR-CON) 20 MEQ tablet, Take 1 tablet (20 mEq total) by mouth 2 (two) times daily., Disp: 180 tablet, Rfl: 1 .  potassium chloride SA (K-DUR,KLOR-CON) 20 MEQ tablet, Take 1 tablet (20 mEq total) by mouth 2 (two) times daily., Disp: 60 tablet, Rfl: 1 .  traMADol (ULTRAM) 50 MG tablet, Take 2 tablets (100 mg total) by mouth every 6 (six) hours as needed., Disp: 120 tablet, Rfl: 0 .  traMADol (ULTRAM) 50 MG tablet, Take 2 tablets (100 mg total) by mouth every 8 (eight) hours as needed., Disp: 120 tablet, Rfl: 0 .  traMADol (ULTRAM) 50 MG tablet, TAKE (2) TABLETS EVERY SIX HOURS AS NEEDED., Disp: 120 tablet, Rfl: 0  BP 108/81 mmHg  Ht 5' 1.75" (1.568 m)  Wt 185 lb (83.915 kg)  BMI 34.13 kg/m2  Physical Exam  Constitutional: She is oriented to person, place, and time.  She appears well-developed and well-nourished. No distress.  Cardiovascular: Normal rate and intact distal pulses.   Neurological: She is alert and oriented to person, place, and time. She has normal reflexes. She exhibits normal muscle tone. Coordination normal.  Skin: Skin is warm and dry. No rash noted. She is not diaphoretic. No erythema. No pallor.  Psychiatric: She has a normal mood and affect. Her behavior is normal. Judgment and thought content normal.    Ortho Exam  Left ankle exam and left foot exam no tenderness no swelling full range of motion at the ankle drawer test normal muscle tone normal no atrophy skin  intact no erythema dorsalis pedis pulse 2+ sensation normal  Right leg positive straight leg raise at 90 in the seated position exacerbated by dorsiflexion of the foot lower back tenderness. Knee and hip range of motion normal knee and hip stable dorsiflexion and plantar flexion strength remains normal skin is intact dorsalis pedis pulse 2+ no peripheral edema no sensory abnormalities to touch   ASSESSMENT: Resolved left foot pain Resolved left ankle pain New back pain with sciatica  PLAN She tried steroid dose packs in the past didn't help tramadol not helping Advil not helping  Recommend 80 mg dose of IM Depo-Medrol return for reevaluation and back x-rays  A steroid injection was performed at right IM gluteus maximus using 1% plain Lidocaine and 80 mg Depo-Medrol This was well tolerated.

## 2015-05-20 NOTE — Patient Instructions (Signed)
You have received an injection of steroids . 15% of patients will have increased pain within the 24 hours postinjection.   This is transient and will go away.   We recommend that you use ice packs on the injection site for 20 minutes every 2 hours and extra strength Tylenol 2 tablets every 8 as needed until the pain resolves.  If you continue to have pain after taking the Tylenol and using the ice please call the office for further instructions.  

## 2015-05-28 ENCOUNTER — Ambulatory Visit (INDEPENDENT_AMBULATORY_CARE_PROVIDER_SITE_OTHER): Payer: Medicare Other | Admitting: Family Medicine

## 2015-05-28 ENCOUNTER — Encounter: Payer: Self-pay | Admitting: Family Medicine

## 2015-05-28 VITALS — BP 109/61 | HR 78 | Temp 97.8°F | Ht 61.75 in | Wt 178.4 lb

## 2015-05-28 DIAGNOSIS — J101 Influenza due to other identified influenza virus with other respiratory manifestations: Secondary | ICD-10-CM

## 2015-05-28 DIAGNOSIS — R05 Cough: Secondary | ICD-10-CM

## 2015-05-28 DIAGNOSIS — R059 Cough, unspecified: Secondary | ICD-10-CM

## 2015-05-28 DIAGNOSIS — J020 Streptococcal pharyngitis: Secondary | ICD-10-CM

## 2015-05-28 DIAGNOSIS — R509 Fever, unspecified: Secondary | ICD-10-CM

## 2015-05-28 LAB — RAPID STREP SCREEN (MED CTR MEBANE ONLY): Strep Gp A Ag, IA W/Reflex: POSITIVE — AB

## 2015-05-28 LAB — VERITOR FLU A/B WAIVED
Influenza A: POSITIVE — AB
Influenza B: NEGATIVE

## 2015-05-28 MED ORDER — BETAMETHASONE SOD PHOS & ACET 6 (3-3) MG/ML IJ SUSP
6.0000 mg | Freq: Once | INTRAMUSCULAR | Status: AC
  Administered 2015-05-28: 6 mg via INTRAMUSCULAR

## 2015-05-28 MED ORDER — AMOXICILLIN-POT CLAVULANATE 875-125 MG PO TABS: 1.0000 | ORAL_TABLET | Freq: Two times a day (BID) | ORAL | Status: DC

## 2015-05-28 MED ORDER — OSELTAMIVIR PHOSPHATE 75 MG PO CAPS: 75.0000 mg | ORAL_CAPSULE | Freq: Two times a day (BID) | ORAL | Status: DC

## 2015-05-28 NOTE — Progress Notes (Signed)
Subjective:  Patient ID: Sheila [name], female    DOB: 1940-06-08  Age: 75 y.o. MRN: [id number]  CC: URI   HPI Sheila [name] presents for  Patient presents with dry cough runny stuffy nose. Diffuse headache of moderate intensity. Patient also has chills and subjective fever. Body aches worst in the back but present in the legs, shoulders, and torso as well. Has sapped the energy to the point that of being unable to perform usual activities other than ADLs. Onset4 days ago.   History Sheila [name] has a past medical history of Hypertension; Depression; Hypercholesteremia; Diverticulosis of colon; Hypothyroidism; Chest pain (07/2009); DJD (degenerative joint disease) of knee; Bell's palsy; UTI (lower urinary tract infection); Gastric ulcer with hemorrhage (01/14/2011); Cataract; and History of fractured pelvis.   She has past surgical history that includes Knee surgery (04/2010.); Esophagogastroduodenoscopy (01/14/2011); Joint replacement; Hip surgery; laparoscopic appendectomy (N/A, 08/01/2013); and Appendectomy.   Her family history includes Alzheimer's disease in her sister; Aneurysm in her mother; Cancer in her brother, brother, and father; Heart disease in her sister; Hypertension in her mother; Stroke in her mother.She reports that she has quit smoking. Her smokeless tobacco use includes Snuff. She reports that she does not drink alcohol or use illicit drugs.    ROS Review of Systems  Constitutional: Positive for fever, chills and appetite change.  HENT: Positive for congestion and rhinorrhea. Negative for ear pain, nosebleeds, postnasal drip, sinus pressure and sore throat.   Respiratory: Negative for chest tightness and shortness of breath.   Cardiovascular: Negative for chest pain.  Musculoskeletal: Positive for myalgias.  Skin: Negative for rash.  Neurological: Positive for headaches.    Objective:  BP 109/61 mmHg  Pulse 78  Temp(Src) 97.8 F (36.6 C) (Oral)  Ht 5' 1.75" (1.568 m)  Wt  178 lb 6.4 oz (80.922 kg)  BMI 32.91 kg/m2  SpO2 99%  BP Readings from Last 3 Encounters:  05/28/15 109/61  05/20/15 108/81  04/29/15 113/76    Wt Readings from Last 3 Encounters:  05/28/15 178 lb 6.4 oz (80.922 kg)  05/20/15 185 lb (83.915 kg)  04/29/15 185 lb 3.2 oz (84.006 kg)     Physical Exam  Constitutional: She appears well-developed and well-nourished.  HENT:  Head: Normocephalic and atraumatic.  Right Ear: Tympanic membrane and external ear normal. No decreased hearing is noted.  Left Ear: Tympanic membrane and external ear normal. No decreased hearing is noted.  Nose: Mucosal edema present. Right sinus exhibits no frontal sinus tenderness. Left sinus exhibits no frontal sinus tenderness.  Mouth/Throat: No oropharyngeal exudate or posterior oropharyngeal erythema.  Neck: No Brudzinski's sign noted.  Pulmonary/Chest: Breath sounds normal. No respiratory distress.  Lymphadenopathy:       Head (right side): No preauricular adenopathy present.       Head (left side): No preauricular adenopathy present.       Right cervical: No superficial cervical adenopathy present.      Left cervical: No superficial cervical adenopathy present.     Lab Results  Component Value Date   WBC 10.0 12/27/2013   HGB 13.3 12/27/2013   HCT 39.6 12/27/2013   PLT 240 08/02/2013   GLUCOSE 103* 12/31/2014   CHOL 144 10/18/2014   TRIG 153* 10/18/2014   HDL 51 10/18/2014   LDLCALC 62 10/18/2014   ALT 19 12/31/2014   AST 18 12/31/2014   NA 139 12/31/2014   K 4.4 12/31/2014   CL 98 12/31/2014   CREATININE 0.76 12/31/2014  BUN 18 12/31/2014   CO2 25 12/31/2014   TSH 3.870 10/18/2014   INR 1.00 02/09/2011   HGBA1C 4.9% 11/15/2012    Mr Thoracic Spine Wo Contrast  04/10/2015  CLINICAL DATA:  Chronic progressive back pain. Multiple falls. T11 compression fracture. EXAM: MRI THORACIC SPINE WITHOUT CONTRAST TECHNIQUE: Multiplanar, multisequence MR imaging of the thoracic spine was  performed. No intravenous contrast was administered. COMPARISON:  Chest x-rays dated 03/04/2015, 08/01/2013, and 07/19/2012 FINDINGS: There is an old benign-appearing compression fracture of the inferior endplate of 624THL with slight protrusion of bone and disc material into the spinal canal without neural impingement. There are no other thoracic fractures. No bone destruction. The thoracic spinal cord appears normal with no mass lesion or myelopathy. Paraspinal soft tissues are normal. Conus tip is at L1-2. T2-3: Tiny central subligamentous disc protrusion with no neural impingement. T5-6: Tiny central subligamentous disc bulge with no neural impingement. Slight left facet arthritis. T9-10: Tiny central subligamentous disc protrusion with no neural impingement. Slight hypertrophy of the ligamentum flavum at T9-10, T10-11 and T11-12 with no neural impingement. IMPRESSION: Old benign-appearing slight compression fracture of the inferior aspect of T11. Several small disc bulges/ protrusions in the thoracic spine without neural impingement. Normal thoracic spinal cord. Electronically Signed   By: Lorriane Shire M.D.   On: 04/10/2015 08:42    Assessment & Plan:   Sheila [name] was seen today for uri.  Diagnoses and all orders for this visit:  Cough -     Veritor Flu A/B Waived -     Rapid strep screen (not at [facility]) -     betamethasone acetate-betamethasone sodium phosphate (CELESTONE) injection 6 mg; Inject 1 mL (6 mg total) into the muscle once.  Fever, unspecified -     Veritor Flu A/B Waived -     Rapid strep screen (not at [facility]) -     betamethasone acetate-betamethasone sodium phosphate (CELESTONE) injection 6 mg; Inject 1 mL (6 mg total) into the muscle once.  Influenza A -     betamethasone acetate-betamethasone sodium phosphate (CELESTONE) injection 6 mg; Inject 1 mL (6 mg total) into the muscle once.  Strep pharyngitis -     betamethasone acetate-betamethasone sodium phosphate (CELESTONE) injection  6 mg; Inject 1 mL (6 mg total) into the muscle once.  Other orders -     oseltamivir (TAMIFLU) 75 MG capsule; Take 1 capsule (75 mg total) by mouth 2 (two) times daily. -     amoxicillin-clavulanate (AUGMENTIN) 875-125 MG tablet; Take 1 tablet by mouth 2 (two) times daily. Take all of this medication      I am having Ms. Lauderbaugh start on oseltamivir and amoxicillin-clavulanate. I am also having her maintain her estradiol, acetaminophen, Multiple Vitamins-Minerals (CENTRUM SILVER ADULT 50+ PO), Omega-3 Fatty Acids (FISH OIL PO), clobetasol cream, furosemide, potassium chloride SA, omeprazole, levothyroxine, fluticasone, denosumab, hydroxypropyl methylcellulose / hypromellose, diclofenac sodium, albuterol, cyclobenzaprine, budesonide-formoterol, atorvastatin, azelastine, albuterol, traMADol, traMADol, traMADol, ALPRAZolam, clotrimazole-betamethasone, citalopram, omeprazole, potassium chloride SA, and gabapentin. We will continue to administer betamethasone acetate-betamethasone sodium phosphate.  Meds ordered this encounter  Medications  . oseltamivir (TAMIFLU) 75 MG capsule    Sig: Take 1 capsule (75 mg total) by mouth 2 (two) times daily.    Dispense:  10 capsule    Refill:  0  . amoxicillin-clavulanate (AUGMENTIN) 875-125 MG tablet    Sig: Take 1 tablet by mouth 2 (two) times daily. Take all of this medication    Dispense:  20 tablet  Refill:  0  . betamethasone acetate-betamethasone sodium phosphate (CELESTONE) injection 6 mg    Sig:      Follow-up: Return if symptoms worsen or fail to improve.  Claretta Fraise, M.D.

## 2015-05-28 NOTE — Addendum Note (Signed)
Addended by: Marin Olp on: 05/28/2015 07:41 PM   Modules accepted: Miquel Dunn

## 2015-06-10 ENCOUNTER — Other Ambulatory Visit: Payer: Self-pay | Admitting: Nurse Practitioner

## 2015-06-11 NOTE — Telephone Encounter (Signed)
Last seen 05/28/15  Dr Livia Snellen   Last lipid 10/18/14

## 2015-06-11 NOTE — Telephone Encounter (Signed)
Please send request to his PCP 

## 2015-06-12 ENCOUNTER — Telehealth: Payer: Self-pay | Admitting: Family

## 2015-06-12 NOTE — Telephone Encounter (Signed)
Patient called stating that she has had diarrhea (watery stools) for about 3 weeks now.  Patient states that she has tried OTC medication (immodium) and would like for something to be sent to her pharmacy

## 2015-06-13 ENCOUNTER — Ambulatory Visit (INDEPENDENT_AMBULATORY_CARE_PROVIDER_SITE_OTHER): Payer: Medicare Other | Admitting: Orthopedic Surgery

## 2015-06-13 ENCOUNTER — Ambulatory Visit (INDEPENDENT_AMBULATORY_CARE_PROVIDER_SITE_OTHER): Payer: Medicare Other

## 2015-06-13 VITALS — BP 129/82 | HR 95 | Ht 61.75 in | Wt 178.0 lb

## 2015-06-13 DIAGNOSIS — M545 Low back pain, unspecified: Secondary | ICD-10-CM

## 2015-06-13 DIAGNOSIS — M5441 Lumbago with sciatica, right side: Secondary | ICD-10-CM | POA: Diagnosis not present

## 2015-06-13 DIAGNOSIS — M549 Dorsalgia, unspecified: Secondary | ICD-10-CM | POA: Diagnosis not present

## 2015-06-13 DIAGNOSIS — G8929 Other chronic pain: Secondary | ICD-10-CM | POA: Diagnosis not present

## 2015-06-13 NOTE — Telephone Encounter (Signed)
There is really not anything stronger that we have that We can prescribe for short-term diarrhea like this. The thing that I would recommend is for her to come in and be tested for her stool to see why she is having this persistent diarrhea. Imodium is what I recommend a by patient's and have her continue that

## 2015-06-13 NOTE — Progress Notes (Signed)
Patient ID: Sheila [name], female   DOB: September 10, 1940, 75 y.o.   MRN: [id number]  Chief Complaint  Patient presents with  . Follow-up    Right sciatica    HPI-Follow-up history of right sciatica and lower back pain and received IM injection with good relief however still complains of pain getting out of bed  Review of Systems  Constitutional: Negative for fever and chills.  Neurological: Negative for tingling.    BP 129/82 mmHg  Pulse 95  Ht 5' 1.75" (1.568 m)  Wt 178 lb (80.74 kg)  BMI 32.84 kg/m2  Physical Exam  Constitutional: She is oriented to person, place, and time. She appears well-developed and well-nourished. No distress.  Cardiovascular: Normal rate and intact distal pulses.   Neurological: She is alert and oriented to person, place, and time. She exhibits normal muscle tone. Coordination normal.  Skin: Skin is warm and dry. No rash noted. She is not diaphoretic. No erythema. No pallor.  Psychiatric: She has a normal mood and affect. Her behavior is normal. Judgment and thought content normal.    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 The other option is to see her primary care physician for ongoing issues related to that.

## 2015-06-13 NOTE — Telephone Encounter (Signed)
Patient aware of dr dettingers advise.

## 2015-06-18 ENCOUNTER — Other Ambulatory Visit: Payer: Self-pay | Admitting: Pharmacist

## 2015-06-18 DIAGNOSIS — L03032 Cellulitis of left toe: Secondary | ICD-10-CM | POA: Diagnosis not present

## 2015-06-18 MED ORDER — DENOSUMAB 60 MG/ML ~~LOC~~ SOLN: 60.0000 mg | SUBCUTANEOUS | Status: DC

## 2015-06-19 DIAGNOSIS — J40 Bronchitis, not specified as acute or chronic: Secondary | ICD-10-CM | POA: Diagnosis not present

## 2015-06-19 DIAGNOSIS — J209 Acute bronchitis, unspecified: Secondary | ICD-10-CM | POA: Diagnosis not present

## 2015-07-03 ENCOUNTER — Telehealth: Payer: Self-pay | Admitting: Pharmacist

## 2015-07-03 NOTE — Telephone Encounter (Signed)
Talked to nurse

## 2015-07-04 ENCOUNTER — Other Ambulatory Visit: Payer: Self-pay | Admitting: Family

## 2015-07-09 ENCOUNTER — Other Ambulatory Visit: Payer: Self-pay | Admitting: Family

## 2015-07-10 ENCOUNTER — Ambulatory Visit (INDEPENDENT_AMBULATORY_CARE_PROVIDER_SITE_OTHER): Payer: Medicare Other

## 2015-07-10 ENCOUNTER — Telehealth: Payer: Self-pay | Admitting: Family

## 2015-07-10 ENCOUNTER — Encounter: Payer: Self-pay | Admitting: Pharmacist

## 2015-07-10 ENCOUNTER — Ambulatory Visit (INDEPENDENT_AMBULATORY_CARE_PROVIDER_SITE_OTHER): Payer: Medicare Other | Admitting: Pharmacist

## 2015-07-10 VITALS — BP 102/72 | HR 62 | Ht 62.0 in | Wt 170.0 lb

## 2015-07-10 DIAGNOSIS — Z1211 Encounter for screening for malignant neoplasm of colon: Secondary | ICD-10-CM

## 2015-07-10 DIAGNOSIS — Z0000 Encounter for general adult medical examination without abnormal findings: Secondary | ICD-10-CM

## 2015-07-10 DIAGNOSIS — M810 Age-related osteoporosis without current pathological fracture: Secondary | ICD-10-CM

## 2015-07-10 MED ORDER — DENOSUMAB 60 MG/ML ~~LOC~~ SOLN
60.0000 mg | Freq: Once | SUBCUTANEOUS | Status: AC
  Administered 2015-07-10: 60 mg via SUBCUTANEOUS

## 2015-07-10 NOTE — Patient Instructions (Addendum)
Sheila [name] , Thank you for taking time to come for your Medicare Wellness Visit. I appreciate your ongoing commitment to your health goals. Please review the following plan we discussed and let me know if I can assist you in the future.   These are the goals we discussed:  Continue to walk as much as you are able - your weight has decreased by 8 pounds and this is great.    Increase non-starchy vegetables - carrots, green bean, squash, zucchini, tomatoes, onions, peppers, spinach and other green leafy vegetables, cabbage, lettuce, cucumbers, asparagus, okra (not fried), eggplant Limit sugar and processed foods (cakes, cookies, ice cream, crackers and chips) Increase fresh fruit but limit serving sizes 1/2 cup or about the size of tennis or baseball Limit red meat to no more than 1-2 times per week (serving size about the size of your palm) Choose whole grains / lean proteins - whole wheat bread, quinoa, whole grain rice (1/2 cup), fish, chicken, [place] Avoid sugar and calorie containing beverages - soda, sweet tea and juice.  Choose water or unsweetened tea instead.   I am checking you old chart to see if we gave you the Pneumovax 23 - if not we will try to give later this year.  This is a list of the screening recommended for you and due dates:  Health Maintenance  Topic Date Due  . DEXA scan (bone density measurement)  06/15/2015  . Stool Blood Test  07/10/2015  . Pneumonia vaccines (2 of 2 - PPSV23) 07/06/2015  . Flu Shot  09/24/2015  . Tetanus Vaccine  02/24/2016  . Mammogram  07/17/2016  . Colon Cancer Screening  03/26/2022  . Shingles Vaccine  Completed    Fall Prevention in the Home  Falls can cause injuries and can affect people from all age groups. There are many simple things that you can do to make your home safe and to help prevent falls. WHAT CAN I DO ON THE OUTSIDE OF MY HOME?  Regularly repair the edges of walkways and driveways and fix any cracks.  Remove high  doorway thresholds.  Trim any shrubbery on the main path into your home.  Use bright outdoor lighting.  Clear walkways of debris and clutter, including tools and rocks.  Regularly check that handrails are securely fastened and in good repair. Both sides of any steps should have handrails.  Install guardrails along the edges of any raised decks or porches.  Have leaves, snow, and ice cleared regularly.  Use sand or salt on walkways during winter months.  In the garage, clean up any spills right away, including grease or oil spills. WHAT CAN I DO IN THE BATHROOM?  Use night lights.  Install grab bars by the toilet and in the tub and shower. Do not use towel bars as grab bars.  Use non-skid mats or decals on the floor of the tub or shower.  If you need to sit down while you are in the shower, use a plastic, non-slip stool.[name]  Keep the floor dry. Immediately clean up any water that spills on the floor.  Remove soap buildup in the tub or shower on a regular basis.  Attach bath mats securely with double-sided non-slip rug tape.  Remove throw rugs and other tripping hazards from the floor. WHAT CAN I DO IN THE BEDROOM?  Use night lights.  Make sure that a bedside light is easy to reach.  Do not use oversized bedding that drapes onto the floor.  Have a firm chair that has side arms to use for getting dressed.  Remove throw rugs and other tripping hazards from the floor. WHAT CAN I DO IN THE KITCHEN?   Clean up any spills right away.  Avoid walking on wet floors.  Place frequently used items in easy-to-reach places.  If you need to reach for something above you, use a sturdy step stool that has a grab bar.  Keep electrical cables out of the way.  Do not use floor polish or wax that makes floors slippery. If you have to use wax, make sure that it is non-skid floor wax.  Remove throw rugs and other tripping hazards from the floor. WHAT CAN I DO IN THE STAIRWAYS?  Do  not leave any items on the stairs.  Make sure that there are handrails on both sides of the stairs. Fix handrails that are broken or loose. Make sure that handrails are as long as the stairways.  Check any carpeting to make sure that it is firmly attached to the stairs. Fix any carpet that is loose or worn.  Avoid having throw rugs at the top or bottom of stairways, or secure the rugs with carpet tape to prevent them from moving.  Make sure that you have a light switch at the top of the stairs and the bottom of the stairs. If you do not have them, have them installed. WHAT ARE SOME OTHER FALL PREVENTION TIPS?  Wear closed-toe shoes that fit well and support your feet. Wear shoes that have rubber soles or low heels.  When you use a stepladder, make sure that it is completely opened and that the sides are firmly locked. Have someone hold the ladder while you are using it. Do not climb a closed stepladder.  Add color or contrast paint or tape to grab bars and handrails in your home. Place contrasting color strips on the first and last steps.  Use mobility aids as needed, such as canes, walkers, scooters, and crutches.  Turn on lights if it is dark. Replace any light bulbs that burn out.  Set up furniture so that there are clear paths. Keep the furniture in the same spot.  Fix any uneven floor surfaces.  Choose a carpet design that does not hide the edge of steps of a stairway.  Be aware of any and all pets.  Review your medicines with your healthcare provider. Some medicines can cause dizziness or changes in blood pressure, which increase your risk of falling. Talk with your health care provider about other ways that you can decrease your risk of falls. This may include working with a physical therapist or trainer to improve your strength, balance, and endurance.   This information is not intended to replace advice given to you by your health care provider. Make sure you discuss any  questions you have with your health care provider.   Document Released: 01/30/2002 Document Revised: 06/26/2014 Document Reviewed: 03/16/2014 Elsevier Interactive Patient Education [employer].

## 2015-07-10 NOTE — Progress Notes (Signed)
Patient ID: Sheila [name], female   DOB: November 26, 1940, 75 y.o.   MRN: [id number]    Subjective:   Sheila [name] is a 75 y.o. female who presents for a subsequent Medicare Annual Wellness Visit and to have Prolia administered today.  She has been taking Prolia since 05/2011 and has tolerated well.  Her last DEXA was 05/2013 and is due today.   Sheila [name] reports she has been feeling better lately. She needs assistance for ADLs and has a nurse that comes daily to assist her.  She has been walking with her nurse lately and has lost weight.   Review of Systems  Review of Systems  Constitutional: Positive for weight loss.  HENT: Negative.   Eyes: Negative.   Respiratory: Negative.   Cardiovascular: Negative.   Gastrointestinal: Negative.   Genitourinary: Negative.   Musculoskeletal: Positive for joint pain.  Skin: Negative.   Neurological: Negative.   Endo/Heme/Allergies: Negative.   Psychiatric/Behavioral: Negative.      Current Medications (verified) Outpatient Encounter Prescriptions as of 07/10/2015  Medication Sig  . acetaminophen (TYLENOL) 650 MG CR tablet Take 1,300 mg by mouth every 8 (eight) hours as needed for pain.   [name] albuterol (PROVENTIL HFA;VENTOLIN HFA) 108 (90 Base) MCG/ACT inhaler Inhale 2 puffs into the lungs every 6 (six) hours as needed for wheezing.  [name] albuterol (PROVENTIL) (2.5 MG/3ML) 0.083% nebulizer solution Nebulize 1 vial every 6-8 hours as needed for wheezing or shortness of breath.  . ALPRAZolam (XANAX) 0.5 MG tablet TAKE  (1)  TABLET TWICE A DAY.  [name] atorvastatin (LIPITOR) 20 MG tablet TAKE 1 TABLET DAILY  . azelastine (ASTELIN) 0.1 % nasal spray USE 1 TO 2 SPRAYS IN EACH NOSTRIL AT BEDTIME  . budesonide-formoterol (SYMBICORT) 80-4.5 MCG/ACT inhaler Inhale 2 puffs into the lungs 2 (two) times daily.  . citalopram (CELEXA) 20 MG tablet TAKE 1 TABLET DAILY  . denosumab (PROLIA) 60 MG/ML SOLN injection Inject 60 mg into the skin every 6 (six) months. Bring to office  for administration.  [name] estradiol (ESTRACE) 0.1 MG/GM vaginal cream Place 1 Applicatorful vaginally 3 (three) times a week.  . fluticasone (FLONASE) 50 MCG/ACT nasal spray USE 1 TO 2 SPRAYS IN EACH NOSTRIL AT BEDTIME  . furosemide (LASIX) 20 MG tablet TAKE 1 TABLET DAILY  . gabapentin (NEURONTIN) 300 MG capsule TAKE (1) CAPSULE THREE TIMES DAILY.  . hydroxypropyl methylcellulose / hypromellose (ISOPTO TEARS / GONIOVISC) 2.5 % ophthalmic solution Place 1 drop into both eyes as needed for dry eyes.  [name] levothyroxine (SYNTHROID, LEVOTHROID) 75 MCG tablet Take 1 tablet (75 mcg total) by mouth daily before breakfast.  . Multiple Vitamins-Minerals (CENTRUM SILVER ADULT 50+ PO) Take 1 tablet by mouth every other day.  . Omega-3 Fatty Acids (FISH OIL PO) Take 2 capsules by mouth 2 (two) times daily.  [name] omeprazole (PRILOSEC) 40 MG capsule TAKE (1) CAPSULE DAILY  . potassium chloride SA (K-DUR,KLOR-CON) 20 MEQ tablet TAKE  (1)  TABLET TWICE A DAY.  . traMADol (ULTRAM) 50 MG tablet Take 2 tablets (100 mg total) by mouth every 6 (six) hours as needed.  . clotrimazole-betamethasone (LOTRISONE) cream Apply topically 2 (two) times daily.  . traMADol (ULTRAM) 50 MG tablet Take 2 tablets (100 mg total) by mouth every 8 (eight) hours as needed.  . traMADol (ULTRAM) 50 MG tablet TAKE (2) TABLETS EVERY SIX HOURS AS NEEDED.  . [DISCONTINUED] amoxicillin-clavulanate (AUGMENTIN) 875-125 MG tablet Take 1 tablet by mouth 2 (two) times daily. Take  all of this medication (Patient not taking: Reported on 07/10/2015)  . [DISCONTINUED] clobetasol cream (TEMOVATE) [id number] % Apply 1 application topically 2 (two) times daily. (Patient not taking: Reported on 07/10/2015)  . [DISCONTINUED] cyclobenzaprine (FLEXERIL) 5 MG tablet Take 1 tablet (5 mg total) by mouth 3 (three) times daily as needed for muscle spasms. (Patient not taking: Reported on 07/10/2015)  . [DISCONTINUED] diclofenac sodium (VOLTAREN) 1 % GEL Apply 2 g topically 4 (four)  times daily. (Patient not taking: Reported on 07/10/2015)  . [DISCONTINUED] omeprazole (PRILOSEC) 40 MG capsule TAKE (1) CAPSULE DAILY  . [DISCONTINUED] oseltamivir (TAMIFLU) 75 MG capsule Take 1 capsule (75 mg total) by mouth 2 (two) times daily. (Patient not taking: Reported on 07/10/2015)  . [DISCONTINUED] potassium chloride SA (K-DUR,KLOR-CON) 20 MEQ tablet Take 1 tablet (20 mEq total) by mouth 2 (two) times daily.  . [EXPIRED] denosumab (PROLIA) injection 60 mg    No facility-administered encounter medications on file as of 07/10/2015.    Allergies (verified) Benadryl; Codeine; Diphenhydramine hcl; Ibuprofen; and Morphine and related   History: Past Medical History  Diagnosis Date  . Hypertension   . Depression   . Hypercholesteremia   . Diverticulosis of colon   . Hypothyroidism   . Chest pain 07/2009    Myoveiw stress test negative.  [name] DJD (degenerative joint disease) of knee   . Bell's palsy   . UTI (lower urinary tract infection)   . Gastric ulcer with hemorrhage 01/14/2011  . Cataract   . History of fractured pelvis   . Cancer ([place])     skin CA on face   Past Surgical History  Procedure Laterality Date  . Knee surgery  04/2010.    Total left knee replacement  . Esophagogastroduodenoscopy  01/14/2011    Procedure: ESOPHAGOGASTRODUODENOSCOPY (EGD);  Surgeon: Rogene Houston, MD;  Location: [facility];  Service: Endoscopy;  Laterality: N/A;  . Joint replacement    . Hip surgery      pelvis  . Laparoscopic appendectomy N/A 08/01/2013    Procedure: APPENDECTOMY LAPAROSCOPIC;  Surgeon: Jamesetta So, MD;  Location: [facility] ORS;  Service: General;  Laterality: N/A;  . Appendectomy     Family History  Problem Relation Age of Onset  . Aneurysm Mother   . Stroke Mother   . Hypertension Mother   . Cancer Father     lung  . Heart disease Sister   . Cancer Brother   . Alzheimer's disease Sister   . Cancer Brother    Social History   Occupational History  . Not on file.    Social History Main Topics  . Smoking status: Former Smoker    Quit date: 02/24/1968  . Smokeless tobacco: Current User    Types: Snuff     Comment: quit yrs and yrs ago when she was very young  . Alcohol Use: No  . Drug Use: No  . Sexual Activity: No    Do you feel safe at home?  Yes Are there smokers in your home (other than you)? No  Dietary issues and exercise activities: Current Exercise Habits: Home exercise routine, Type of exercise: walking, Time (Minutes): 10, Frequency (Times/Week): 5, Weekly Exercise (Minutes/Week): 50, Intensity: Mild  Current Dietary habits:  Not following any special diet   Objective:    Today's Vitals   07/10/15 1444  BP: 102/72  Pulse: 62  Height: 5\' 2"  (1.575 m)  Weight: 170 lb (77.111 kg)  PainSc: 4   PainLoc: Back  Body mass index is 31.09 kg/(m^2).  DEXA Results Date of Test T-Score for [facility] Spine L1-L4 T-Score for Neck of Left Hip  07/09/2013 0.0 -2.4  06/14/2013 0.0 -2.6  04/15/2011 -0.2 -2.7  05/18/2007 0.0 -2.2     Activities of Daily Living In your present state of health, do you have any difficulty performing the following activities: 07/10/2015  Hearing? N  Vision? N  Difficulty concentrating or making decisions? N  Walking or climbing stairs? Y  Dressing or bathing? Y  Doing errands, shopping? Y  Preparing Food and eating ? N  Using the Toilet? N  In the past six months, have you accidently leaked urine? N  Do you have problems with loss of bowel control? N  Managing your Medications? N  Managing your Finances? N  Housekeeping or managing your Housekeeping? N     Cardiac Risk Factors include: advanced age (>61men, >63 women);dyslipidemia;family history of premature cardiovascular disease;hypertension;obesity (BMI >30kg/m2)  Depression Screen PHQ 2/9 Scores 07/10/2015 04/29/2015 03/04/2015 10/18/2014  PHQ - 2 Score 1 0 0 4  PHQ- 9 Score - - - 12     Fall Risk Fall Risk  07/10/2015 03/04/2015 10/18/2014 07/06/2014  05/11/2014  Falls in the past year? No Yes Yes No No  Number falls in past yr: - 1 1 - -  Injury with Fall? - Yes No - -    Cognitive Function: MMSE - Mini Mental State Exam 07/10/2015 07/06/2014 07/06/2014  Not completed: Unable to complete - -  Orientation to time 4 5 5   Orientation to Place 5 2 2   Registration 3 3 3   Attention/ Calculation 0 0 0  Recall 3 3 3   Language- name 2 objects 2 2 -  Language- repeat 1 1 -  Language- follow 3 step command 3 3 -  Language- read & follow direction 1 0 -  Language-read & follow direction-comments - patient is not able to compete this question because does not read -  Write a sentence 0 0 -  Write a sentence-comments - unable to complete this question b/c does not read -  Copy design 0 1 -  Total score 22 20 -    Immunizations and Health Maintenance Immunization History  Administered Date(s) Administered  . Influenza,inj,Quad PF,36+ Mos 11/15/2012, 12/11/2013, 01/07/2015  . Pneumococcal Conjugate-13 07/06/2014  . Tdap 02/23/2006   Health Maintenance Due  Topic Date Due  . DEXA SCAN  06/15/2015  . COLON CANCER SCREENING ANNUAL FOBT  07/10/2015  . PNA vac Low Risk Adult (2 of 2 - PPSV23) 07/06/2015    Patient Care Team: Sharion Balloon, FNP as PCP - General (Family Medicine) Rogene Houston, MD as Consulting Physician (Gastroenterology) Burnell Blanks, MD as Consulting Physician (Cardiology)  Indicate any recent Medical Services you may have received from other than [facility] providers in the past year (date may be approximate).    Assessment:    Annual Wellness Visit  Osteoporosis Obesity - patient has lost 8#   Screening Tests Health Maintenance  Topic Date Due  . DEXA SCAN  06/15/2015  . COLON CANCER SCREENING ANNUAL FOBT  07/10/2015  . PNA vac Low Risk Adult (2 of 2 - PPSV23) 07/06/2015  . INFLUENZA VACCINE  09/24/2015  . TETANUS/TDAP  02/24/2016  . MAMMOGRAM  07/17/2016  . COLONOSCOPY  03/26/2022  . ZOSTAVAX   Completed        Plan:   During the course of the visit Sheila [name] was educated and counseled  about the following appropriate screening and preventive services:   Vaccines to include Pneumoccal, Influenza, Td, Zostavax - patient believes she has received Pneumovax 23 vaccine - will request paper chart to verify  Colorectal cancer screening - patient given FOBT   Cardiovascular disease - last EKG 2015  Diabetes screening - need to recheck FBG and A1c  Bone Denisty / Osteoporosis Screening - DEXA done today; improved BMD with Prolia therapy.  Will continue Prolia for 1 more dose = 5 years of therapy  Mammogram - appt made today for 07/21/15  PAP - appt made today for 07/2015  Glaucoma screening / Eye Exam - UTD  Nutrition counseling - Discussed increasing fruits and vegetables, whole grains and lean proteins.   Great weight loss.  Discussed BMI goals.  Tobacco cessation counseling - offered counseling - patient declined  Advanced Directives - information given today  Physical Activity - continue to walk daily   Patient Instructions (the written plan) were given to the patient.   Cherre Robins, [facility]   07/10/2015

## 2015-07-10 NOTE — Telephone Encounter (Signed)
Patient aware that she is due to make appointment with her PCP for a follow up and labs.

## 2015-07-12 ENCOUNTER — Other Ambulatory Visit: Payer: Medicare Other

## 2015-07-12 DIAGNOSIS — Z1211 Encounter for screening for malignant neoplasm of colon: Secondary | ICD-10-CM | POA: Diagnosis not present

## 2015-07-16 LAB — FECAL OCCULT BLOOD, IMMUNOCHEMICAL: Fecal Occult Bld: NEGATIVE

## 2015-07-16 LAB — PLEASE NOTE

## 2015-07-17 ENCOUNTER — Telehealth: Payer: Self-pay | Admitting: Family

## 2015-07-17 NOTE — Telephone Encounter (Signed)
Reviewed test results with pt

## 2015-07-18 ENCOUNTER — Telehealth: Payer: Self-pay | Admitting: *Deleted

## 2015-07-18 DIAGNOSIS — J209 Acute bronchitis, unspecified: Secondary | ICD-10-CM

## 2015-07-18 MED ORDER — BUDESONIDE-FORMOTEROL FUMARATE 80-4.5 MCG/ACT IN AERO: 2.0000 | INHALATION_SPRAY | Freq: Two times a day (BID) | RESPIRATORY_TRACT | Status: DC

## 2015-07-18 NOTE — Telephone Encounter (Signed)
done

## 2015-07-23 ENCOUNTER — Other Ambulatory Visit: Payer: Self-pay | Admitting: Family

## 2015-07-23 ENCOUNTER — Ambulatory Visit: Payer: Medicare Other

## 2015-07-23 ENCOUNTER — Encounter: Payer: Self-pay | Admitting: *Deleted

## 2015-07-23 DIAGNOSIS — Z1231 Encounter for screening mammogram for malignant neoplasm of breast: Secondary | ICD-10-CM | POA: Diagnosis not present

## 2015-07-24 ENCOUNTER — Ambulatory Visit (INDEPENDENT_AMBULATORY_CARE_PROVIDER_SITE_OTHER): Payer: Medicare Other | Admitting: Family Medicine

## 2015-07-24 ENCOUNTER — Encounter: Payer: Self-pay | Admitting: Family Medicine

## 2015-07-24 VITALS — BP 134/92 | HR 86 | Temp 97.2°F | Ht 62.0 in | Wt 165.8 lb

## 2015-07-24 DIAGNOSIS — R52 Pain, unspecified: Secondary | ICD-10-CM | POA: Diagnosis not present

## 2015-07-24 MED ORDER — AMITRIPTYLINE HCL 10 MG PO TABS: 10.0000 mg | ORAL_TABLET | Freq: Every day | ORAL | Status: DC

## 2015-07-24 NOTE — Telephone Encounter (Signed)
Last seen 05/28/15  Dr Livia Snellen  If approved route to nurse to call into [facility]

## 2015-07-24 NOTE — Telephone Encounter (Signed)
Please review and advise.

## 2015-07-24 NOTE — Progress Notes (Signed)
Subjective:    Patient ID: Sheila [name], female    DOB: 01-May-1940, 75 y.o.   MRN: [id number]  HPI 76 year old female who is "hurting all over.". She tells me her home health aide since she had an abrasion spot between her shoulder blades. She does denies any history of fall. She takes tramadol for generalized pain that says that is no longer effective as she has been on it for a while and she is unable to take codeine. I also see in her active problem list that she has opioid dependence. She did not mention that during the course of the visit  Patient Active Problem List   Diagnosis Date Noted  . Opioid dependence ([place]) 04/29/2015  . Pain medication agreement signed 04/29/2015  . Chronic back pain 04/29/2015  . Osteoarthritis 04/29/2015  . Metabolic syndrome 123456  . BMI 37.0-37.9, adult 10/18/2014  . Neuropathy ([place]) 10/18/2014  . Hypokalemia 10/18/2014  . Depressed 09/05/2013  . Osteoporosis 06/16/2012  . PUD (peptic ulcer disease) 06/16/2012  . GAD (generalized anxiety disorder) 06/16/2012  . Hyperlipidemia 06/16/2012  . DJD (degenerative joint disease) 06/16/2012  . Hypercalcemia 06/16/2012  . Essential hypertension, benign 06/16/2012  . Gastric ulcer with hemorrhage 01/14/2011  . Diverticulosis of colon 01/13/2011  . Prerenal azotemia 01/13/2011  . Leukocytosis 01/13/2011  . Sacral insufficiency fracture 01/13/2011  . Groin pain, left lower quadrant 01/13/2011  . Hypothyroidism 01/13/2011  . Orthostatic lightheadedness 01/13/2011  . CHEST PAIN-PRECORDIAL 07/16/2009  . ABNORMAL EKG 07/16/2009   Outpatient Encounter Prescriptions as of 07/24/2015  Medication Sig  . acetaminophen (TYLENOL) 650 MG CR tablet Take 1,300 mg by mouth every 8 (eight) hours as needed for pain.   [name] albuterol (PROVENTIL HFA;VENTOLIN HFA) 108 (90 Base) MCG/ACT inhaler Inhale 2 puffs into the lungs every 6 (six) hours as needed for wheezing.  [name] albuterol (PROVENTIL) (2.5 MG/3ML) 0.083% nebulizer  solution Nebulize 1 vial every 6-8 hours as needed for wheezing or shortness of breath.  . ALPRAZolam (XANAX) 0.5 MG tablet TAKE  (1)  TABLET TWICE A DAY.  [name] atorvastatin (LIPITOR) 20 MG tablet TAKE 1 TABLET DAILY  . azelastine (ASTELIN) 0.1 % nasal spray USE 1 TO 2 SPRAYS IN EACH NOSTRIL AT BEDTIME  . budesonide-formoterol (SYMBICORT) 80-4.5 MCG/ACT inhaler Inhale 2 puffs into the lungs 2 (two) times daily.  . citalopram (CELEXA) 20 MG tablet TAKE 1 TABLET DAILY  . clotrimazole-betamethasone (LOTRISONE) cream Apply topically 2 (two) times daily.  [name] denosumab (PROLIA) 60 MG/ML SOLN injection Inject 60 mg into the skin every 6 (six) months. Bring to office for administration.  [name] estradiol (ESTRACE) 0.1 MG/GM vaginal cream Place 1 Applicatorful vaginally 3 (three) times a week.  . fluticasone (FLONASE) 50 MCG/ACT nasal spray USE 1 TO 2 SPRAYS IN EACH NOSTRIL AT BEDTIME  . furosemide (LASIX) 20 MG tablet TAKE 1 TABLET DAILY  . gabapentin (NEURONTIN) 300 MG capsule TAKE (1) CAPSULE THREE TIMES DAILY.  . hydroxypropyl methylcellulose / hypromellose (ISOPTO TEARS / GONIOVISC) 2.5 % ophthalmic solution Place 1 drop into both eyes as needed for dry eyes.  [name] levothyroxine (SYNTHROID, LEVOTHROID) 75 MCG tablet Take 1 tablet (75 mcg total) by mouth daily before breakfast.  . Multiple Vitamins-Minerals (CENTRUM SILVER ADULT 50+ PO) Take 1 tablet by mouth every other day.  . Omega-3 Fatty Acids (FISH OIL PO) Take 2 capsules by mouth 2 (two) times daily.  [name] omeprazole (PRILOSEC) 40 MG capsule TAKE (1) CAPSULE DAILY  . potassium chloride SA (  K-DUR,KLOR-CON) 20 MEQ tablet TAKE  (1)  TABLET TWICE A DAY.  . traMADol (ULTRAM) 50 MG tablet Take 2 tablets (100 mg total) by mouth every 6 (six) hours as needed.  . traMADol (ULTRAM) 50 MG tablet Take 2 tablets (100 mg total) by mouth every 8 (eight) hours as needed.  . traMADol (ULTRAM) 50 MG tablet TAKE (2) TABLETS EVERY SIX HOURS AS NEEDED.   No  facility-administered encounter medications on file as of 07/24/2015.      Review of Systems  Constitutional: Negative.   Respiratory: Negative.   Cardiovascular: Negative.   Musculoskeletal: Positive for myalgias, back pain, arthralgias and gait problem.  Psychiatric/Behavioral: Negative.        Objective:   Physical Exam  Constitutional: She appears well-developed and well-nourished.  Cardiovascular: Normal rate and regular rhythm.   Pulmonary/Chest: Effort normal and breath sounds normal.  Musculoskeletal:  There is generalized tenderness to palpation in her neck to her knees elbows back. The distribution is suspicious for fibromyalgia.  Psychiatric: She has a normal mood and affect. Her behavior is normal.   BP 134/92 mmHg  Pulse 86  Temp(Src) 97.2 F (36.2 C) (Oral)  Ht 5\' 2"  (1.575 m)  Wt 165 lb 12.8 oz (75.206 kg)  BMI 30.32 kg/m2        Assessment & Plan:  1. Diffuse pain I have asked her to stop atorvastatin for 2 weeks to see if this makes any difference in her symptoms. Continue other medicines but also add amitriptyline 10 mg to take at night. Will check her back in 2 weeks  Wardell Honour MD

## 2015-07-25 NOTE — Telephone Encounter (Signed)
Zero refills and #[id number] phoned to pharm

## 2015-07-25 NOTE — Progress Notes (Signed)
Patient ID: Sheila [name], female   DOB: [birth year], 75 y.o.   MRN: [id number] VERIFIED PNEUMOVAX 23 ADMINISTRATION 11/2007

## 2015-08-06 DIAGNOSIS — M5416 Radiculopathy, lumbar region: Secondary | ICD-10-CM | POA: Diagnosis not present

## 2015-08-06 DIAGNOSIS — M545 Low back pain: Secondary | ICD-10-CM | POA: Diagnosis not present

## 2015-08-06 DIAGNOSIS — M4316 Spondylolisthesis, lumbar region: Secondary | ICD-10-CM | POA: Diagnosis not present

## 2015-08-07 ENCOUNTER — Ambulatory Visit (INDEPENDENT_AMBULATORY_CARE_PROVIDER_SITE_OTHER): Payer: Medicare Other | Admitting: Family Medicine

## 2015-08-07 ENCOUNTER — Encounter: Payer: Self-pay | Admitting: Family Medicine

## 2015-08-07 VITALS — BP 103/69 | HR 96 | Temp 97.6°F | Ht 62.0 in | Wt 171.0 lb

## 2015-08-07 DIAGNOSIS — M1990 Unspecified osteoarthritis, unspecified site: Secondary | ICD-10-CM

## 2015-08-07 DIAGNOSIS — I10 Essential (primary) hypertension: Secondary | ICD-10-CM | POA: Diagnosis not present

## 2015-08-07 DIAGNOSIS — E039 Hypothyroidism, unspecified: Secondary | ICD-10-CM

## 2015-08-07 DIAGNOSIS — G629 Polyneuropathy, unspecified: Secondary | ICD-10-CM | POA: Diagnosis not present

## 2015-08-07 NOTE — Progress Notes (Signed)
Subjective:    Patient ID: Sheila [name], female    DOB: September 03, 1940, 75 y.o.   MRN: [id number]  HPI 75 year old female here to follow-up diffuse aches and pains and myalgias. At her last visit 2 weeks ago suggested to hold the atorvastatin and begin amitriptyline 10 mg. The patient is not a very reliable historian. The aide who accompanies her today says that she can find mobiles for most of the medicines that she is on. I do think she has held a atorvastatin and does report some improvement in her aches and pains. She saw a neurosurgeon yesterday who is planning to do an what sounds like epidural injection in her back for her leg pain. Among medicines that there is a question about her taking is her thyroid medicine and that may also contribute to her myalgias. Will plan to check TSH today to see if she is hypothyroid.  Patient Active Problem List   Diagnosis Date Noted  . Opioid dependence ([place]) 04/29/2015  . Pain medication agreement signed 04/29/2015  . Chronic back pain 04/29/2015  . Osteoarthritis 04/29/2015  . Metabolic syndrome 123456  . BMI 37.0-37.9, adult 10/18/2014  . Neuropathy ([place]) 10/18/2014  . Hypokalemia 10/18/2014  . Depressed 09/05/2013  . Osteoporosis 06/16/2012  . PUD (peptic ulcer disease) 06/16/2012  . GAD (generalized anxiety disorder) 06/16/2012  . Hyperlipidemia 06/16/2012  . DJD (degenerative joint disease) 06/16/2012  . Hypercalcemia 06/16/2012  . Essential hypertension, benign 06/16/2012  . Gastric ulcer with hemorrhage 01/14/2011  . Diverticulosis of colon 01/13/2011  . Prerenal azotemia 01/13/2011  . Leukocytosis 01/13/2011  . Sacral insufficiency fracture 01/13/2011  . Groin pain, left lower quadrant 01/13/2011  . Hypothyroidism 01/13/2011  . Orthostatic lightheadedness 01/13/2011  . CHEST PAIN-PRECORDIAL 07/16/2009  . ABNORMAL EKG 07/16/2009   Outpatient Encounter Prescriptions as of 08/07/2015  Medication Sig  . acetaminophen (TYLENOL) 650  MG CR tablet Take 1,300 mg by mouth every 8 (eight) hours as needed for pain.   [name] albuterol (PROVENTIL HFA;VENTOLIN HFA) 108 (90 Base) MCG/ACT inhaler Inhale 2 puffs into the lungs every 6 (six) hours as needed for wheezing.  [name] albuterol (PROVENTIL) (2.5 MG/3ML) 0.083% nebulizer solution Nebulize 1 vial every 6-8 hours as needed for wheezing or shortness of breath.  [name] amitriptyline (ELAVIL) 10 MG tablet Take 1 tablet (10 mg total) by mouth at bedtime.  . budesonide-formoterol (SYMBICORT) 80-4.5 MCG/ACT inhaler Inhale 2 puffs into the lungs 2 (two) times daily.  . clotrimazole-betamethasone (LOTRISONE) cream Apply topically 2 (two) times daily.  [name] denosumab (PROLIA) 60 MG/ML SOLN injection Inject 60 mg into the skin every 6 (six) months. Bring to office for administration.  . fluticasone (FLONASE) 50 MCG/ACT nasal spray USE 1 TO 2 SPRAYS IN EACH NOSTRIL AT BEDTIME  . hydroxypropyl methylcellulose / hypromellose (ISOPTO TEARS / GONIOVISC) 2.5 % ophthalmic solution Place 1 drop into both eyes as needed for dry eyes.  . Multiple Vitamins-Minerals (CENTRUM SILVER ADULT 50+ PO) Take 1 tablet by mouth every other day.  . Omega-3 Fatty Acids (FISH OIL PO) Take 2 capsules by mouth 2 (two) times daily.  . traMADol (ULTRAM) 50 MG tablet Take 2 tablets (100 mg total) by mouth every 8 (eight) hours as needed.  . traMADol (ULTRAM) 50 MG tablet TAKE (2) TABLETS EVERY SIX HOURS AS NEEDED.  [name] traMADol (ULTRAM) 50 MG tablet TAKE 2 TABLET EVERY 8 HOURS AS NEEDED  . ALPRAZolam (XANAX) 0.5 MG tablet TAKE  (1)  TABLET TWICE A DAY. (  Patient not taking: Reported on 08/07/2015)  . atorvastatin (LIPITOR) 20 MG tablet TAKE 1 TABLET DAILY (Patient not taking: Reported on 08/07/2015)  . azelastine (ASTELIN) 0.1 % nasal spray USE 1 TO 2 SPRAYS IN EACH NOSTRIL AT BEDTIME (Patient not taking: Reported on 08/07/2015)  . citalopram (CELEXA) 20 MG tablet TAKE 1 TABLET DAILY (Patient not taking: Reported on 08/07/2015)  . estradiol  (ESTRACE) 0.1 MG/GM vaginal cream Place 1 Applicatorful vaginally 3 (three) times a week. Reported on 08/07/2015  . furosemide (LASIX) 20 MG tablet TAKE 1 TABLET DAILY (Patient not taking: Reported on 08/07/2015)  . gabapentin (NEURONTIN) 300 MG capsule TAKE (1) CAPSULE THREE TIMES DAILY. (Patient not taking: Reported on 08/07/2015)  . levothyroxine (SYNTHROID, LEVOTHROID) 75 MCG tablet Take 1 tablet (75 mcg total) by mouth daily before breakfast. (Patient not taking: Reported on 08/07/2015)  . omeprazole (PRILOSEC) 40 MG capsule TAKE (1) CAPSULE DAILY (Patient not taking: Reported on 08/07/2015)  . potassium chloride SA (K-DUR,KLOR-CON) 20 MEQ tablet TAKE  (1)  TABLET TWICE A DAY. (Patient not taking: Reported on 08/07/2015)   No facility-administered encounter medications on file as of 08/07/2015.      Review of Systems  Constitutional: Negative.   HENT: Negative.   Respiratory: Negative.   Cardiovascular: Negative.   Psychiatric/Behavioral: Positive for confusion and sleep disturbance.       Objective:   Physical Exam  Constitutional: She is oriented to person, place, and time. She appears well-developed and well-nourished.  Cardiovascular: Normal rate and regular rhythm.   Pulmonary/Chest: Effort normal and breath sounds normal.  Neurological: She is alert and oriented to person, place, and time.  Psychiatric: She has a normal mood and affect. Her behavior is normal.   BP 103/69 mmHg  Pulse 96  Temp(Src) 97.6 F (36.4 C) (Oral)  Ht 5\' 2"  (1.575 m)  Wt 171 lb (77.565 kg)  BMI 31.27 kg/m2        Assessment & Plan:  1. Hypothyroidism, unspecified hypothyroidism type This will help guide whether or not to continue with thyroid replacement and what dose - TSH  2. Essential hypertension, benign Blood pressures are well controlled. She is not on antihypertensive  3. Neuropathy ([place]) By history she may be taking the gabapentin a yellow capsule 3 times a day  4. Osteoarthritis,  unspecified osteoarthritis type, unspecified site As noted above patient is due for possible epidural continue to withhold atorvastatin  Wardell Honour MD

## 2015-08-08 ENCOUNTER — Other Ambulatory Visit: Payer: Self-pay | Admitting: Nurse Practitioner

## 2015-08-08 ENCOUNTER — Other Ambulatory Visit: Payer: Self-pay | Admitting: Family Medicine

## 2015-08-08 ENCOUNTER — Other Ambulatory Visit: Payer: Self-pay | Admitting: Family

## 2015-08-08 LAB — TSH: TSH: 1.03 u[IU]/mL (ref 0.450–4.500)

## 2015-08-08 NOTE — Telephone Encounter (Signed)
Tramadol called to [facility]

## 2015-08-08 NOTE — Telephone Encounter (Signed)
Last seen 08/07/15 Dr Sabra Heck   If approved route to nurse to call into [facility]

## 2015-08-08 NOTE — Telephone Encounter (Signed)
Last seen 08/07/15  Dr Wannetta Sender PCP  If approved route to nurse to call into [facility]

## 2015-08-13 NOTE — Progress Notes (Signed)
Patient aware.

## 2015-08-14 ENCOUNTER — Emergency Department (HOSPITAL_COMMUNITY): Payer: Medicare Other

## 2015-08-14 ENCOUNTER — Emergency Department (HOSPITAL_COMMUNITY)
Admission: EM | Admit: 2015-08-14 | Discharge: 2015-08-15 | Disposition: A | Discharge status: Home / Self Care | Payer: Medicare Other | Attending: Emergency Medicine | Admitting: Emergency Medicine

## 2015-08-14 ENCOUNTER — Other Ambulatory Visit: Payer: Self-pay

## 2015-08-14 ENCOUNTER — Encounter (HOSPITAL_COMMUNITY): Payer: Self-pay

## 2015-08-14 DIAGNOSIS — E7800 Pure hypercholesterolemia, unspecified: Secondary | ICD-10-CM | POA: Diagnosis not present

## 2015-08-14 DIAGNOSIS — C44300 Unspecified malignant neoplasm of skin of unspecified part of face: Secondary | ICD-10-CM | POA: Insufficient documentation

## 2015-08-14 DIAGNOSIS — F329 Major depressive disorder, single episode, unspecified: Secondary | ICD-10-CM | POA: Diagnosis not present

## 2015-08-14 DIAGNOSIS — I10 Essential (primary) hypertension: Secondary | ICD-10-CM | POA: Diagnosis not present

## 2015-08-14 DIAGNOSIS — Z79899 Other long term (current) drug therapy: Secondary | ICD-10-CM | POA: Diagnosis not present

## 2015-08-14 DIAGNOSIS — Z87891 Personal history of nicotine dependence: Secondary | ICD-10-CM | POA: Insufficient documentation

## 2015-08-14 DIAGNOSIS — R51 Headache: Secondary | ICD-10-CM | POA: Diagnosis not present

## 2015-08-14 DIAGNOSIS — R531 Weakness: Secondary | ICD-10-CM | POA: Diagnosis not present

## 2015-08-14 DIAGNOSIS — R05 Cough: Secondary | ICD-10-CM | POA: Diagnosis not present

## 2015-08-14 LAB — CBC WITH DIFFERENTIAL/PLATELET
Basophils Absolute: 0 10*3/uL (ref 0.0–0.1)
Basophils Relative: 0 %
Eosinophils Absolute: 0.1 10*3/uL (ref 0.0–0.7)
Eosinophils Relative: 1 %
HCT: 38.5 % (ref 36.0–46.0)
Hemoglobin: 13.1 g/dL (ref 12.0–15.0)
Lymphocytes Relative: 35 %
Lymphs Abs: 4.1 10*3/uL — ABNORMAL HIGH (ref 0.7–4.0)
MCH: 29.3 pg (ref 26.0–34.0)
MCHC: 34 g/dL (ref 30.0–36.0)
MCV: 86.1 fL (ref 78.0–100.0)
Monocytes Absolute: 0.7 10*3/uL (ref 0.1–1.0)
Monocytes Relative: 6 %
Neutro Abs: 6.7 10*3/uL (ref 1.7–7.7)
Neutrophils Relative %: 58 %
Platelets: 322 10*3/uL (ref 150–400)
RBC: 4.47 MIL/uL (ref 3.87–5.11)
RDW: 14.1 % (ref 11.5–15.5)
WBC: 11.7 10*3/uL — ABNORMAL HIGH (ref 4.0–10.5)

## 2015-08-14 LAB — URINALYSIS, ROUTINE W REFLEX MICROSCOPIC
Bilirubin Urine: NEGATIVE
Glucose, UA: NEGATIVE mg/dL
Hgb urine dipstick: NEGATIVE
Ketones, ur: NEGATIVE mg/dL
Nitrite: NEGATIVE
Protein, ur: NEGATIVE mg/dL
Specific Gravity, Urine: 1.01 (ref 1.005–1.030)
pH: 6 (ref 5.0–8.0)

## 2015-08-14 LAB — BASIC METABOLIC PANEL
Anion gap: 8 (ref 5–15)
BUN: 19 mg/dL (ref 6–20)
CO2: 25 mmol/L (ref 22–32)
Calcium: 10.2 mg/dL (ref 8.9–10.3)
Chloride: 105 mmol/L (ref 101–111)
Creatinine, Ser: 0.84 mg/dL (ref 0.44–1.00)
GFR calc Af Amer: >60 mL/min (ref >60)
GFR calc non Af Amer: >60 mL/min (ref >60)
Glucose, Bld: 93 mg/dL (ref 65–99)
Potassium: 3.4 mmol/L — ABNORMAL LOW (ref 3.5–5.1)
Sodium: 138 mmol/L (ref 135–145)

## 2015-08-14 LAB — URINE MICROSCOPIC-ADD ON: RBC / HPF: NONE SEEN RBC/hpf (ref 0–5)

## 2015-08-14 LAB — TROPONIN I: Troponin I: <0.03 ng/mL (ref <0.031)

## 2015-08-14 LAB — HEPATIC FUNCTION PANEL
ALT: 21 U/L (ref 14–54)
AST: 24 U/L (ref 15–41)
Albumin: 3.9 g/dL (ref 3.5–5.0)
Alkaline Phosphatase: 51 U/L (ref 38–126)
Bilirubin, Direct: 0.2 mg/dL (ref 0.1–0.5)
Indirect Bilirubin: 0.7 mg/dL (ref 0.3–0.9)
Total Bilirubin: 0.9 mg/dL (ref 0.3–1.2)
Total Protein: 7 g/dL (ref 6.5–8.1)

## 2015-08-14 LAB — CK: Total CK: 39 U/L (ref 38–234)

## 2015-08-14 LAB — TSH: TSH: 0.628 u[IU]/mL (ref 0.350–4.500)

## 2015-08-14 LAB — LIPASE, BLOOD: Lipase: 49 U/L (ref 11–51)

## 2015-08-14 MED ORDER — SODIUM CHLORIDE 0.9 % IV BOLUS (SEPSIS)
1000.0000 mL | Freq: Once | INTRAVENOUS | Status: AC
Start: 2015-08-14 — End: 2015-08-14
  Administered 2015-08-14: 1000 mL via INTRAVENOUS

## 2015-08-14 NOTE — ED Notes (Signed)
Pt ambulated to the bathroom and back. Approximately 100 ft without difficulty.

## 2015-08-14 NOTE — ED Notes (Signed)
Pt reports that she has not felt well since Saturday night. Body aches, episode of diarrhea and sorethroat. Intermittent coughing

## 2015-08-14 NOTE — ED Notes (Signed)
Pt had no difficulties with fluid challenge.

## 2015-08-14 NOTE — ED Provider Notes (Signed)
CSN: [id number]     Arrival date & time 08/14/15  1840 History   First MD Initiated Contact with Patient 08/14/15 1941     Chief Complaint  Patient presents with  . Weakness     (Consider location/radiation/quality/duration/timing/severity/associated sxs/prior Treatment) HPI Comments: Patient is a poor historian. She states she's had 4 days of body aches, generalized weakness, sore throat, intermittent coughing. Denies any fever but has not checked her temperature. Denies abdominal pain, nausea, vomiting or diarrhea. Denies any dysuria hematuria. Has had a raw sore throat as well as a dry cough. Denies any sick contacts or recent travel. Record review shows she is seen her PCP for similar complaints in the past week. Her thyroid function was normal. She was told to stop taking her statin to see if that helped her symptoms but she reports she is still taking it. She endorses no appetite and normal urine output. No vomiting. No chest pain or shortness of breath.states she is taking tramadol home for pain without relief. Hypertensive on arrival. States she was taken off her BP meds.  The history is provided by the patient and a relative.    Past Medical History  Diagnosis Date  . Hypertension   . Depression   . Hypercholesteremia   . Diverticulosis of colon   . Hypothyroidism   . Chest pain 07/2009    Myoveiw stress test negative.  [name] DJD (degenerative joint disease) of knee   . Bell's palsy   . UTI (lower urinary tract infection)   . Gastric ulcer with hemorrhage 01/14/2011  . Cataract   . History of fractured pelvis   . Cancer ([place])     skin CA on face   Past Surgical History  Procedure Laterality Date  . Knee surgery  04/2010.    Total left knee replacement  . Esophagogastroduodenoscopy  01/14/2011    Procedure: ESOPHAGOGASTRODUODENOSCOPY (EGD);  Surgeon: Rogene Houston, MD;  Location: [facility];  Service: Endoscopy;  Laterality: N/A;  . Joint replacement    . Hip surgery       pelvis  . Laparoscopic appendectomy N/A 08/01/2013    Procedure: APPENDECTOMY LAPAROSCOPIC;  Surgeon: Jamesetta So, MD;  Location: [facility];  Service: General;  Laterality: N/A;  . Appendectomy     Family History  Problem Relation Age of Onset  . Aneurysm Mother   . Stroke Mother   . Hypertension Mother   . Cancer Father     lung  . Heart disease Sister   . Cancer Brother   . Alzheimer's disease Sister   . Cancer Brother    Social History  Substance Use Topics  . Smoking status: Former Smoker    Quit date: 02/24/1968  . Smokeless tobacco: Current User    Types: Snuff     Comment: quit yrs and yrs ago when she was very young  . Alcohol Use: No   OB History    No data available     Review of Systems  Constitutional: Positive for activity change, appetite change and fatigue. Negative for fever.  HENT: Negative for congestion and rhinorrhea.   Eyes: Negative for visual disturbance.  Respiratory: Negative for cough, chest tightness and shortness of breath.   Cardiovascular: Negative for chest pain.  Gastrointestinal: Negative for nausea, vomiting and abdominal pain.  Genitourinary: Negative for dysuria, hematuria, vaginal bleeding and vaginal discharge.  Musculoskeletal: Positive for myalgias and arthralgias.  Skin: Negative for rash.  Neurological: Positive for weakness and light-headedness.  Negative for dizziness and headaches.  A complete 10 system review of systems was obtained and all systems are negative except as noted in the HPI and PMH.      Allergies  Benadryl; Codeine; Diphenhydramine hcl; Ibuprofen; and Morphine and related  Home Medications   Prior to Admission medications   Medication Sig Start Date End Date Taking? Authorizing Provider  acetaminophen (TYLENOL) 650 MG CR tablet Take 1,300 mg by mouth every 8 (eight) hours as needed for pain.     Historical Provider, MD  albuterol (PROVENTIL HFA;VENTOLIN HFA) 108 (90 Base) MCG/ACT inhaler Inhale 2 puffs  into the lungs every 6 (six) hours as needed for wheezing. 03/04/15   Sharion Balloon, FNP  albuterol (PROVENTIL) (2.5 MG/3ML) 0.083% nebulizer solution Nebulize 1 vial every 6-8 hours as needed for wheezing or shortness of breath. 04/15/15   Sharion Balloon, FNP  ALPRAZolam Duanne Moron) 0.5 MG tablet TAKE  (1)  TABLET TWICE A DAY. 08/08/15   Wardell Honour, MD  amitriptyline (ELAVIL) 10 MG tablet Take 1 tablet (10 mg total) by mouth at bedtime. 08/08/15   Sharion Balloon, FNP  atorvastatin (LIPITOR) 20 MG tablet TAKE 1 TABLET DAILY Patient not taking: Reported on 08/07/2015 06/11/15   Claretta Fraise, MD  azelastine (ASTELIN) 0.1 % nasal spray USE 1 TO 2 SPRAYS IN EACH NOSTRIL AT BEDTIME Patient not taking: Reported on 08/07/2015 07/10/15   Sharion Balloon, FNP  budesonide-formoterol (SYMBICORT) 80-4.5 MCG/ACT inhaler Inhale 2 puffs into the lungs 2 (two) times daily. 07/18/15   Claretta Fraise, MD  citalopram (CELEXA) 20 MG tablet TAKE 1 TABLET DAILY 08/08/15   Sharion Balloon, FNP  clotrimazole-betamethasone (LOTRISONE) cream Apply topically 2 (two) times daily. 07/05/15   Sharion Balloon, FNP  denosumab (PROLIA) 60 MG/ML SOLN injection Inject 60 mg into the skin every 6 (six) months. Bring to office for administration. 06/18/15   Tammy Eckard, PHARMD  estradiol (ESTRACE) 0.1 MG/GM vaginal cream Place 1 Applicatorful vaginally 3 (three) times a week. Reported on 08/07/2015    Historical Provider, MD  fluticasone (FLONASE) 50 MCG/ACT nasal spray USE 1 TO 2 SPRAYS IN EACH NOSTRIL AT BEDTIME 11/19/14   Mary-Margaret Hassell Done, FNP  furosemide (LASIX) 20 MG tablet TAKE 1 TABLET DAILY Patient not taking: Reported on 08/07/2015 06/10/15   Claretta Fraise, MD  gabapentin (NEURONTIN) 300 MG capsule TAKE (1) CAPSULE THREE TIMES DAILY. 08/08/15   Sharion Balloon, FNP  hydroxypropyl methylcellulose / hypromellose (ISOPTO TEARS / GONIOVISC) 2.5 % ophthalmic solution Place 1 drop into both eyes as needed for dry eyes.    Historical  Provider, MD  levothyroxine (SYNTHROID, LEVOTHROID) 75 MCG tablet Take 1 tablet (75 mcg total) by mouth daily before breakfast. Patient not taking: Reported on 08/07/2015 10/18/14   Mary-Margaret Hassell Done, FNP  Multiple Vitamins-Minerals (CENTRUM SILVER ADULT 50+ PO) Take 1 tablet by mouth every other day.    Historical Provider, MD  Omega-3 Fatty Acids (FISH OIL PO) Take 2 capsules by mouth 2 (two) times daily.    Historical Provider, MD  omeprazole (PRILOSEC) 40 MG capsule TAKE (1) CAPSULE DAILY Patient not taking: Reported on 08/07/2015 10/18/14   Mary-Margaret Hassell Done, FNP  potassium chloride SA (K-DUR,KLOR-CON) 20 MEQ tablet TAKE  (1)  TABLET TWICE A DAY. Patient not taking: Reported on 08/07/2015 07/10/15   Sharion Balloon, FNP  traMADol (ULTRAM) 50 MG tablet Take 2 tablets (100 mg total) by mouth every 8 (eight) hours as needed. 04/29/15  Sharion Balloon, FNP  traMADol (ULTRAM) 50 MG tablet TAKE (2) TABLETS EVERY SIX HOURS AS NEEDED. 04/29/15   Sharion Balloon, FNP  traMADol (ULTRAM) 50 MG tablet TAKE 2 TABLET EVERY 8 HOURS AS NEEDED 07/25/15   Sharion Balloon, FNP  traMADol (ULTRAM) 50 MG tablet TAKE 2 TABLET EVERY 8 HOURS AS NEEDED 08/08/15   Sharion Balloon, FNP   BP 205/85 mmHg  Pulse 91  Temp(Src) 98.3 F (36.8 C) (Oral)  Resp 22  Ht 5\' 4"  (1.626 m)  Wt 171 lb (77.565 kg)  BMI 29.34 kg/m2  SpO2 100% Physical Exam  Constitutional: She is oriented to person, place, and time. She appears well-developed and well-nourished. No distress.  HENT:  Head: Normocephalic and atraumatic.  Mouth/Throat: Oropharynx is clear and moist. No oropharyngeal exudate.  Dry mucus membranes  Eyes: Conjunctivae and EOM are normal. Pupils are equal, round, and reactive to light.  Neck: Normal range of motion. Neck supple.  No meningismus.  Cardiovascular: Normal rate, regular rhythm, normal heart sounds and intact distal pulses.   No murmur heard. Pulmonary/Chest: Effort normal and breath sounds normal. No  respiratory distress.  Abdominal: Soft. There is no tenderness. There is no rebound and no guarding.  Musculoskeletal: Normal range of motion. She exhibits no edema or tenderness.  Neurological: She is alert and oriented to person, place, and time. No cranial nerve deficit. She exhibits normal muscle tone. Coordination normal.  No ataxia on finger to nose bilaterally. No pronator drift. 5/5 strength throughout. CN 2-12 intact.Equal grip strength. Sensation intact.   Skin: Skin is warm.  Psychiatric: She has a normal mood and affect. Her behavior is normal.  Nursing note and vitals reviewed.   ED Course  Procedures (including critical care time) Labs Review Labs Reviewed  BASIC METABOLIC PANEL - Abnormal; Notable for the following:    Potassium 3.4 (*)    All other components within normal limits  CBC WITH DIFFERENTIAL/PLATELET - Abnormal; Notable for the following:    WBC 11.7 (*)    Lymphs Abs 4.1 (*)    All other components within normal limits  URINALYSIS, ROUTINE W REFLEX MICROSCOPIC (NOT AT [facility]) - Abnormal; Notable for the following:    Leukocytes, UA SMALL (*)    All other components within normal limits  URINE MICROSCOPIC-ADD ON - Abnormal; Notable for the following:    Squamous Epithelial / LPF 0-5 (*)    Bacteria, UA FEW (*)    All other components within normal limits  URINE CULTURE  TROPONIN I  TSH  CK  HEPATIC FUNCTION PANEL  LIPASE, BLOOD    Imaging Review Dg Chest 2 View  08/14/2015  CLINICAL DATA:  Cough, body aches. Intermittent nonproductive cough since Saturday. EXAM: CHEST  2 VIEW COMPARISON:  03/04/2015 FINDINGS: Heart and mediastinal contours are within normal limits. No focal opacities or effusions. No acute bony abnormality. Mild compression fracture through the inferior endplate of a lower thoracic vertebral body, possibly T11, stable. IMPRESSION: No active cardiopulmonary disease. Electronically Signed   By: Rolm Baptise M.D.   On: 08/14/2015 19:40    I have personally reviewed and evaluated these images and lab results as part of my medical decision-making.   EKG Interpretation   Date/Time:  Wednesday August 14 2015 21:12:23 EDT Ventricular Rate:  76 PR Interval:    QRS Duration: 78 QT Interval:  491 QTC Calculation: 553 R Axis:   34 Text Interpretation:  Sinus rhythm Low voltage, precordial leads Prolonged  QT interval No significant change since last tracing Confirmed by  Winfred Leeds  MD, SAM [phone]) on 08/14/2015 9:25:11 PM      MDM   Final diagnoses:  Generalized weakness  Essential hypertension   4 days of generalized weakness, myalgias, sore throat and cough. No chest pain or shortness of breath. nonfocal neuro exam.  IVF given.   Labs show normal CK, Creatinine. TSH wnl. CXR negative.  Hypertensive in the ED, was previously told to stop antihypertensives because she was dizzy.  Not hypertensive on recent PCP visits.  There is some question about whether she is still supposed to be on verapimil.   With elevated BP and headache will obtain CT. She is tolerating PO and ambulatory.   Dr. Betsey Holiday to discharge after CT head results.    Ezequiel Essex, MD 08/15/15 [phone]

## 2015-08-15 ENCOUNTER — Emergency Department (HOSPITAL_COMMUNITY): Payer: Medicare Other

## 2015-08-15 ENCOUNTER — Ambulatory Visit: Payer: Medicare Other | Admitting: Family Medicine

## 2015-08-15 ENCOUNTER — Telehealth: Payer: Self-pay | Admitting: Family

## 2015-08-15 DIAGNOSIS — R51 Headache: Secondary | ICD-10-CM | POA: Diagnosis not present

## 2015-08-15 DIAGNOSIS — R531 Weakness: Secondary | ICD-10-CM | POA: Diagnosis not present

## 2015-08-15 MED ORDER — VERAPAMIL HCL 120 MG PO TABS
120.0000 mg | ORAL_TABLET | Freq: Once | ORAL | Status: AC
  Administered 2015-08-15: 120 mg via ORAL
  Filled 2015-08-15: qty 1

## 2015-08-15 MED ORDER — VERAPAMIL HCL 120 MG PO TABS
ORAL_TABLET | ORAL | Status: AC
  Filled 2015-08-15: qty 1

## 2015-08-15 NOTE — ED Notes (Signed)
Pt eating crackers and drinking fluids. MD Rancour approved.

## 2015-08-15 NOTE — ED Provider Notes (Signed)
Patient signed out to me to follow-up on head CT. Patient seen for generalized weakness earlier today. She was hypertensive initially, the pressure is now improved. Head CT has been performed and is negative. She does not have any unusual findings on neurologic exam, is appropriate for discharge and follow-up with primary doctor.  Orpah Greek, MD 08/15/15 0200

## 2015-08-15 NOTE — ED Notes (Signed)
AC called to bring Verapamil.

## 2015-08-15 NOTE — Telephone Encounter (Signed)
Sheila [name] aware that based on her medication list she is not supposed to be on Verapamil.

## 2015-08-15 NOTE — Discharge Instructions (Signed)
Weakness Stop taking your lipitor. Monitor your blood pressure and follow up with your doctor. Return to the ED if you develop new or worsening symptoms. Weakness is a lack of strength. It may be felt all over the body (generalized) or in one specific part of the body (focal). Some causes of weakness can be serious. You may need further medical evaluation, especially if you are elderly or you have a history of immunosuppression (such as chemotherapy or HIV), kidney disease, heart disease, or diabetes. CAUSES  Weakness can be caused by many different things, including:  Infection.  Physical exhaustion.  Internal bleeding or other blood loss that results in a lack of red blood cells (anemia).  Dehydration. This cause is more common in elderly people.  Side effects or electrolyte abnormalities from medicines, such as pain medicines or sedatives.  Emotional distress, anxiety, or depression.  Circulation problems, especially severe peripheral arterial disease.  Heart disease, such as rapid atrial fibrillation, bradycardia, or heart failure.  Nervous system disorders, such as Guillain-Barr syndrome, multiple sclerosis, or stroke. DIAGNOSIS  To find the cause of your weakness, your caregiver will take your history and perform a physical exam. Lab tests or X-rays may also be ordered, if needed. TREATMENT  Treatment of weakness depends on the cause of your symptoms and can vary greatly. HOME CARE INSTRUCTIONS   Rest as needed.  Eat a well-balanced diet.  Try to get some exercise every day.  Only take over-the-counter or prescription medicines as directed by your caregiver. SEEK MEDICAL CARE IF:   Your weakness seems to be getting worse or spreads to other parts of your body.  You develop new aches or pains. SEEK IMMEDIATE MEDICAL CARE IF:   You cannot perform your normal daily activities, such as getting dressed and feeding yourself.  You cannot walk up and down stairs, or you  feel exhausted when you do so.  You have shortness of breath or chest pain.  You have difficulty moving parts of your body.  You have weakness in only one area of the body or on only one side of the body.  You have a fever.  You have trouble speaking or swallowing.  You cannot control your bladder or bowel movements.  You have black or bloody vomit or stools. MAKE SURE YOU:  Understand these instructions.  Will watch your condition.  Will get help right away if you are not doing well or get worse.   This information is not intended to replace advice given to you by your health care provider. Make sure you discuss any questions you have with your health care provider.   Document Released: 02/09/2005 Document Revised: 08/11/2011 Document Reviewed: 04/10/2011 Elsevier Interactive Patient Education [employer].

## 2015-08-16 ENCOUNTER — Encounter: Payer: Self-pay | Admitting: Family

## 2015-08-16 LAB — URINE CULTURE

## 2015-08-19 ENCOUNTER — Encounter: Payer: Self-pay | Admitting: Family

## 2015-08-19 ENCOUNTER — Ambulatory Visit (INDEPENDENT_AMBULATORY_CARE_PROVIDER_SITE_OTHER): Payer: Medicare Other | Admitting: Family

## 2015-08-19 VITALS — BP 113/73 | HR 85 | Temp 97.2°F | Ht 64.0 in | Wt 167.4 lb

## 2015-08-19 DIAGNOSIS — Z6837 Body mass index (BMI) 37.0-37.9, adult: Secondary | ICD-10-CM | POA: Diagnosis not present

## 2015-08-19 DIAGNOSIS — G629 Polyneuropathy, unspecified: Secondary | ICD-10-CM

## 2015-08-19 DIAGNOSIS — E8881 Metabolic syndrome: Secondary | ICD-10-CM

## 2015-08-19 DIAGNOSIS — F329 Major depressive disorder, single episode, unspecified: Secondary | ICD-10-CM

## 2015-08-19 DIAGNOSIS — F1120 Opioid dependence, uncomplicated: Secondary | ICD-10-CM

## 2015-08-19 DIAGNOSIS — Z79899 Other long term (current) drug therapy: Secondary | ICD-10-CM

## 2015-08-19 DIAGNOSIS — I10 Essential (primary) hypertension: Secondary | ICD-10-CM | POA: Diagnosis not present

## 2015-08-19 DIAGNOSIS — M549 Dorsalgia, unspecified: Secondary | ICD-10-CM

## 2015-08-19 DIAGNOSIS — M810 Age-related osteoporosis without current pathological fracture: Secondary | ICD-10-CM

## 2015-08-19 DIAGNOSIS — F411 Generalized anxiety disorder: Secondary | ICD-10-CM

## 2015-08-19 DIAGNOSIS — G8929 Other chronic pain: Secondary | ICD-10-CM

## 2015-08-19 DIAGNOSIS — K279 Peptic ulcer, site unspecified, unspecified as acute or chronic, without hemorrhage or perforation: Secondary | ICD-10-CM | POA: Diagnosis not present

## 2015-08-19 DIAGNOSIS — E039 Hypothyroidism, unspecified: Secondary | ICD-10-CM

## 2015-08-19 DIAGNOSIS — Z0289 Encounter for other administrative examinations: Secondary | ICD-10-CM

## 2015-08-19 DIAGNOSIS — M159 Polyosteoarthritis, unspecified: Secondary | ICD-10-CM

## 2015-08-19 DIAGNOSIS — R5383 Other fatigue: Secondary | ICD-10-CM

## 2015-08-19 DIAGNOSIS — Z01419 Encounter for gynecological examination (general) (routine) without abnormal findings: Secondary | ICD-10-CM

## 2015-08-19 DIAGNOSIS — F32A Depression, unspecified: Secondary | ICD-10-CM

## 2015-08-19 DIAGNOSIS — E785 Hyperlipidemia, unspecified: Secondary | ICD-10-CM

## 2015-08-19 DIAGNOSIS — E876 Hypokalemia: Secondary | ICD-10-CM

## 2015-08-19 DIAGNOSIS — M150 Primary generalized (osteo)arthritis: Secondary | ICD-10-CM

## 2015-08-19 DIAGNOSIS — M8949 Other hypertrophic osteoarthropathy, multiple sites: Secondary | ICD-10-CM

## 2015-08-19 MED ORDER — CLOTRIMAZOLE-BETAMETHASONE 1-0.05 % EX CREA: TOPICAL_CREAM | CUTANEOUS | Status: DC

## 2015-08-19 MED ORDER — TRAMADOL HCL 50 MG PO TABS: 100.0000 mg | ORAL_TABLET | Freq: Three times a day (TID) | ORAL | Status: DC | PRN

## 2015-08-19 MED ORDER — NYSTATIN 100000 UNIT/GM EX POWD: Freq: Four times a day (QID) | CUTANEOUS | Status: DC

## 2015-08-19 NOTE — Patient Instructions (Signed)
Health Maintenance, Female Adopting a healthy lifestyle and getting preventive care can go a long way to promote health and wellness. Talk with your health care provider about what schedule of regular examinations is right for you. This is a good chance for you to check in with your provider about disease prevention and staying healthy. In between checkups, there are plenty of things you can do on your own. Experts have done a lot of research about which lifestyle changes and preventive measures are most likely to keep you healthy. Ask your health care provider for more information. WEIGHT AND DIET  Eat a healthy diet  Be sure to include plenty of vegetables, fruits, low-fat dairy products, and lean protein.  Do not eat a lot of foods high in solid fats, added sugars, or salt.  Get regular exercise. This is one of the most important things you can do for your health.  Most adults should exercise for at least 150 minutes each week. The exercise should increase your heart rate and make you sweat (moderate-intensity exercise).  Most adults should also do strengthening exercises at least twice a week. This is in addition to the moderate-intensity exercise.  Maintain a healthy weight  Body mass index (BMI) is a measurement that can be used to identify possible weight problems. It estimates body fat based on height and weight. Your health care provider can help determine your BMI and help you achieve or maintain a healthy weight.  For females 20 years of age and older:   A BMI below 18.5 is considered underweight.  A BMI of 18.5 to 24.9 is normal.  A BMI of 25 to 29.9 is considered overweight.  A BMI of 30 and above is considered obese.  Watch levels of cholesterol and blood lipids  You should start having your blood tested for lipids and cholesterol at 74 years of age, then have this test every 5 years.  You may need to have your cholesterol levels checked more often if:  Your lipid  or cholesterol levels are high.  You are older than 74 years of age.  You are at high risk for heart disease.  CANCER SCREENING   Lung Cancer  Lung cancer screening is recommended for adults 55-80 years old who are at high risk for lung cancer because of a history of smoking.  A yearly low-dose CT scan of the lungs is recommended for people who:  Currently smoke.  Have quit within the past 15 years.  Have at least a 30-pack-year history of smoking. A pack year is smoking an average of one pack of cigarettes a day for 1 year.  Yearly screening should continue until it has been 15 years since you quit.  Yearly screening should stop if you develop a health problem that would prevent you from having lung cancer treatment.  Breast Cancer  Practice breast self-awareness. This means understanding how your breasts normally appear and feel.  It also means doing regular breast self-exams. Let your health care provider know about any changes, no matter how small.  If you are in your 20s or 30s, you should have a clinical breast exam (CBE) by a health care provider every 1-3 years as part of a regular health exam.  If you are 40 or older, have a CBE every year. Also consider having a breast X-ray (mammogram) every year.  If you have a family history of breast cancer, talk to your health care provider about genetic screening.  If you   are at high risk for breast cancer, talk to your health care provider about having an MRI and a mammogram every year.  Breast cancer gene (BRCA) assessment is recommended for women who have family members with BRCA-related cancers. BRCA-related cancers include:  Breast.  Ovarian.  Tubal.  Peritoneal cancers.  Results of the assessment will determine the need for genetic counseling and BRCA1 and BRCA2 testing. Cervical Cancer Your health care provider may recommend that you be screened regularly for cancer of the pelvic organs (ovaries, uterus, and  vagina). This screening involves a pelvic examination, including checking for microscopic changes to the surface of your cervix (Pap test). You may be encouraged to have this screening done every 3 years, beginning at age 21.  For women ages 30-65, health care providers may recommend pelvic exams and Pap testing every 3 years, or they may recommend the Pap and pelvic exam, combined with testing for human papilloma virus (HPV), every 5 years. Some types of HPV increase your risk of cervical cancer. Testing for HPV may also be done on women of any age with unclear Pap test results.  Other health care providers may not recommend any screening for nonpregnant women who are considered low risk for pelvic cancer and who do not have symptoms. Ask your health care provider if a screening pelvic exam is right for you.  If you have had past treatment for cervical cancer or a condition that could lead to cancer, you need Pap tests and screening for cancer for at least 20 years after your treatment. If Pap tests have been discontinued, your risk factors (such as having a new sexual partner) need to be reassessed to determine if screening should resume. Some women have medical problems that increase the chance of getting cervical cancer. In these cases, your health care provider may recommend more frequent screening and Pap tests. Colorectal Cancer  This type of cancer can be detected and often prevented.  Routine colorectal cancer screening usually begins at 74 years of age and continues through 75 years of age.  Your health care provider may recommend screening at an earlier age if you have risk factors for colon cancer.  Your health care provider may also recommend using home test kits to check for hidden blood in the stool.  A small camera at the end of a tube can be used to examine your colon directly (sigmoidoscopy or colonoscopy). This is done to check for the earliest forms of colorectal  cancer.  Routine screening usually begins at age 50.  Direct examination of the colon should be repeated every 5-10 years through 75 years of age. However, you may need to be screened more often if early forms of precancerous polyps or small growths are found. Skin Cancer  Check your skin from head to toe regularly.  Tell your health care provider about any new moles or changes in moles, especially if there is a change in a mole's shape or color.  Also tell your health care provider if you have a mole that is larger than the size of a pencil eraser.  Always use sunscreen. Apply sunscreen liberally and repeatedly throughout the day.  Protect yourself by wearing long sleeves, pants, a wide-brimmed hat, and sunglasses whenever you are outside. HEART DISEASE, DIABETES, AND HIGH BLOOD PRESSURE   High blood pressure causes heart disease and increases the risk of stroke. High blood pressure is more likely to develop in:  People who have blood pressure in the high end   of the normal range (130-139/85-89 mm Hg).  People who are overweight or obese.  People who are African American.  If you are 38-23 years of age, have your blood pressure checked every 3-5 years. If you are 61 years of age or older, have your blood pressure checked every year. You should have your blood pressure measured twice--once when you are at a hospital or clinic, and once when you are not at a hospital or clinic. Record the average of the two measurements. To check your blood pressure when you are not at a hospital or clinic, you can use:  An automated blood pressure machine at a pharmacy.  A home blood pressure monitor.  If you are between 45 years and 39 years old, ask your health care provider if you should take aspirin to prevent strokes.  Have regular diabetes screenings. This involves taking a blood sample to check your fasting blood sugar level.  If you are at a normal weight and have a low risk for diabetes,  have this test once every three years after 74 years of age.  If you are overweight and have a high risk for diabetes, consider being tested at a younger age or more often. PREVENTING INFECTION  Hepatitis B  If you have a higher risk for hepatitis B, you should be screened for this virus. You are considered at high risk for hepatitis B if:  You were born in a country where hepatitis B is common. Ask your health care provider which countries are considered high risk.  Your parents were born in a high-risk country, and you have not been immunized against hepatitis B (hepatitis B vaccine).  You have HIV or AIDS.  You use needles to inject street drugs.  You live with someone who has hepatitis B.  You have had sex with someone who has hepatitis B.  You get hemodialysis treatment.  You take certain medicines for conditions, including cancer, organ transplantation, and autoimmune conditions. Hepatitis C  Blood testing is recommended for:  Everyone born from [birth year] through 1964.  Anyone with known risk factors for hepatitis C. Sexually transmitted infections (STIs)  You should be screened for sexually transmitted infections (STIs) including gonorrhea and chlamydia if:  You are sexually active and are younger than 74 years of age.  You are older than 74 years of age and your health care provider tells you that you are at risk for this type of infection.  Your sexual activity has changed since you were last screened and you are at an increased risk for chlamydia or gonorrhea. Ask your health care provider if you are at risk.  If you do not have HIV, but are at risk, it may be recommended that you take a prescription medicine daily to prevent HIV infection. This is called pre-exposure prophylaxis (PrEP). You are considered at risk if:  You are sexually active and do not regularly use condoms or know the HIV status of your partner(s).  You take drugs by injection.  You are sexually  active with a partner who has HIV. Talk with your health care provider about whether you are at high risk of being infected with HIV. If you choose to begin PrEP, you should first be tested for HIV. You should then be tested every 3 months for as long as you are taking PrEP.  PREGNANCY   If you are premenopausal and you may become pregnant, ask your health care provider about preconception counseling.  If you may  become pregnant, take 400 to 800 micrograms (mcg) of folic acid every day.  If you want to prevent pregnancy, talk to your health care provider about birth control (contraception). OSTEOPOROSIS AND MENOPAUSE   Osteoporosis is a disease in which the bones lose minerals and strength with aging. This can result in serious bone fractures. Your risk for osteoporosis can be identified using a bone density scan.  If you are 61 years of age or older, or if you are at risk for osteoporosis and fractures, ask your health care provider if you should be screened.  Ask your health care provider whether you should take a calcium or vitamin D supplement to lower your risk for osteoporosis.  Menopause may have certain physical symptoms and risks.  Hormone replacement therapy may reduce some of these symptoms and risks. Talk to your health care provider about whether hormone replacement therapy is right for you.  HOME CARE INSTRUCTIONS   Schedule regular health, dental, and eye exams.  Stay current with your immunizations.   Do not use any tobacco products including cigarettes, chewing tobacco, or electronic cigarettes.  If you are pregnant, do not drink alcohol.  If you are breastfeeding, limit how much and how often you drink alcohol.  Limit alcohol intake to no more than 1 drink per day for nonpregnant women. One drink equals 12 ounces of beer, 5 ounces of wine, or 1 ounces of hard liquor.  Do not use street drugs.  Do not share needles.  Ask your health care provider for help if  you need support or information about quitting drugs.  Tell your health care provider if you often feel depressed.  Tell your health care provider if you have ever been abused or do not feel safe at home.   This information is not intended to replace advice given to you by your health care provider. Make sure you discuss any questions you have with your health care provider.   Document Released: 08/25/2010 Document Revised: 03/02/2014 Document Reviewed: 01/11/2013 Elsevier Interactive Patient Education [employer].

## 2015-08-19 NOTE — Progress Notes (Signed)
Subjective:    Patient ID: Sheila [name], female    DOB: 28-Aug-1940, 75 y.o.   MRN: [id number]   Patient here today for follow up of chronic medical problems.   Gynecologic Exam Pertinent negatives include no constipation, diarrhea, headaches or sore throat.  Hypertension This is a chronic problem. The current episode started more than 1 year ago. The problem is unchanged. The problem is controlled. Associated symptoms include anxiety. Pertinent negatives include no chest pain, headaches, palpitations or shortness of breath. Risk factors for coronary artery disease include dyslipidemia, post-menopausal state, obesity and sedentary lifestyle. Past treatments include beta blockers. The current treatment provides moderate improvement. Compliance problems include diet and exercise.  Hypertensive end-organ damage includes CAD/MI and a thyroid problem. There is no history of CVA or PVD.  Hyperlipidemia This is a chronic problem. The current episode started more than 1 year ago. The problem is controlled. Recent lipid tests were reviewed and are normal. Pertinent negatives include no chest pain, myalgias or shortness of breath. Current antihyperlipidemic treatment includes statins. The current treatment provides moderate improvement of lipids. Compliance problems include adherence to diet and adherence to exercise.  Risk factors for coronary artery disease include dyslipidemia, hypertension and post-menopausal.  Gastroesophageal Reflux She reports no chest pain, no choking, no heartburn or no sore throat. This is a chronic problem. The problem occurs occasionally. Nothing aggravates the symptoms. Pertinent negatives include no fatigue, melena or orthopnea. There are no known risk factors. She has tried nothing for the symptoms. The treatment provided moderate relief.  Thyroid Problem Visit type: hypothyroidism. Symptoms include anxiety and depressed mood. Patient reports no constipation, diarrhea, fatigue,  palpitations or visual change. The symptoms have been stable. Her past medical history is significant for hyperlipidemia.  Anxiety Onset was more than 5 years ago. Symptoms include depressed mood and nervous/anxious behavior. Patient reports no chest pain, excessive worry, irritability, palpitations or shortness of breath. Symptoms occur occasionally.   Her past medical history is significant for anxiety/panic attacks and depression. Past treatments include SSRIs.  Depression Currently on celexa- no side effects- seems to be helping Hypokalemia K+ supplement- no c/o muscle cramps Neuropathy Has been on gabapentin for awhile- helps a lot with her pain in bil legs      Review of Systems  Constitutional: Negative.  Negative for irritability and fatigue.  HENT: Negative.  Negative for sore throat.   Eyes: Negative.   Respiratory: Negative.  Negative for choking and shortness of breath.   Cardiovascular: Negative.  Negative for chest pain and palpitations.  Gastrointestinal: Negative for heartburn, diarrhea, constipation and melena.  Endocrine: Negative.   Genitourinary: Negative.   Musculoskeletal: Negative.  Negative for myalgias.  Neurological: Negative.  Negative for headaches.  Hematological: Negative.   Psychiatric/Behavioral: The patient is nervous/anxious.   All other systems reviewed and are negative.      Objective:   Physical Exam  Constitutional: She is oriented to person, place, and time. She appears well-developed and well-nourished. No distress.  HENT:  Head: Normocephalic and atraumatic.  Right Ear: External ear normal.  Mouth/Throat: Oropharynx is clear and moist.  Eyes: Pupils are equal, round, and reactive to light.  Neck: Normal range of motion. Neck supple. No thyromegaly present.  Cardiovascular: Normal rate, regular rhythm, normal heart sounds and intact distal pulses.   No murmur heard. Pulmonary/Chest: Effort normal and breath sounds normal. No  respiratory distress. She has no wheezes. Right breast exhibits no inverted nipple, no mass, no nipple discharge,  no skin change and no tenderness. Left breast exhibits no inverted nipple, no mass, no nipple discharge, no skin change and no tenderness. Breasts are symmetrical.  Abdominal: Soft. Bowel sounds are normal. She exhibits no distension. There is no tenderness.  Genitourinary: Vagina normal.  Bimanual exam- no adnexal masses or tenderness, ovaries nonpalpable   Cervix parous and pink- No discharge   Musculoskeletal: Normal range of motion. She exhibits no edema or tenderness.  FROM of right hip with ain on abduction- mild point tenderness along right buttocks   Neurological: She is alert and oriented to person, place, and time. She has normal reflexes. No cranial nerve deficit.  Skin: Skin is warm and dry.  Psychiatric: She has a normal mood and affect. Her behavior is normal. Judgment and thought content normal.  Vitals reviewed.     BP 113/73 mmHg  Pulse 85  Temp(Src) 97.2 F (36.2 C) (Oral)  Ht '5\' 4"'$  (1.626 m)  Wt 167 lb 6.4 oz (75.932 kg)  BMI 28.72 kg/m2     Assessment & Plan:  1. Essential hypertension, benign - CMP14+EGFR  2. PUD (peptic ulcer disease) - CMP14+EGFR  3. Hypothyroidism, unspecified hypothyroidism type - CMP14+EGFR - Thyroid Panel With TSH  4. Neuropathy (HCC) - CMP14+EGFR  5. Osteoporosis - CMP14+EGFR  6. Primary osteoarthritis involving multiple joints - CMP14+EGFR - traMADol (ULTRAM) 50 MG tablet; Take 2 tablets (100 mg total) by mouth every 8 (eight) hours as needed for moderate pain or severe pain.  Dispense: 120 tablet; Refill: 3  7. GAD (generalized anxiety disorder) - CMP14+EGFR  8. Depressed - CMP14+EGFR  9. BMI 37.0-37.9, adult - CMP14+EGFR  10. Metabolic syndrome - GYB63+SLHT  11. Chronic back pain - CMP14+EGFR - traMADol (ULTRAM) 50 MG tablet; Take 2 tablets (100 mg total) by mouth every 8 (eight) hours as needed  for moderate pain or severe pain.  Dispense: 120 tablet; Refill: 3  12. Pain medication agreement signed - CMP14+EGFR - traMADol (ULTRAM) 50 MG tablet; Take 2 tablets (100 mg total) by mouth every 8 (eight) hours as needed for moderate pain or severe pain.  Dispense: 120 tablet; Refill: 3  13. Uncomplicated opioid dependence (HCC) - CMP14+EGFR - traMADol (ULTRAM) 50 MG tablet; Take 2 tablets (100 mg total) by mouth every 8 (eight) hours as needed for moderate pain or severe pain.  Dispense: 120 tablet; Refill: 3  14. Hypokalemia - CMP14+EGFR  15. Encounter for routine gynecological examination - CMP14+EGFR - Pap IG (Image Guided)  16. Other fatigue - CMP14+EGFR - Anemia Profile B - Thyroid Panel With TSH  17. Hyperlipidemia - Lipid panel   Continue all meds Labs pending Health Maintenance reviewed Diet and exercise encouraged RTO 3 months  [name], FNP

## 2015-08-20 LAB — ANEMIA PROFILE B
Basophils Absolute: 0.1 10*3/uL (ref 0.0–0.2)
Basos: 1 %
EOS (ABSOLUTE): 0.2 10*3/uL (ref 0.0–0.4)
Eos: 2 %
Ferritin: 49 ng/mL (ref 15–150)
Folate: 7.1 ng/mL (ref >3.0)
Hematocrit: 38.9 % (ref 34.0–46.6)
Hemoglobin: 13.4 g/dL (ref 11.1–15.9)
Immature Grans (Abs): 0 10*3/uL (ref 0.0–0.1)
Immature Granulocytes: 0 %
Iron Saturation: 32 % (ref 15–55)
Iron: 120 ug/dL (ref 27–139)
Lymphocytes Absolute: 3.9 10*3/uL — ABNORMAL HIGH (ref 0.7–3.1)
Lymphs: 40 %
MCH: 29.1 pg (ref 26.6–33.0)
MCHC: 34.4 g/dL (ref 31.5–35.7)
MCV: 85 fL (ref 79–97)
Monocytes Absolute: 0.6 10*3/uL (ref 0.1–0.9)
Monocytes: 7 %
Neutrophils Absolute: 4.9 10*3/uL (ref 1.4–7.0)
Neutrophils: 50 %
Platelets: 326 10*3/uL (ref 150–379)
RBC: 4.6 x10E6/uL (ref 3.77–5.28)
RDW: 14.5 % (ref 12.3–15.4)
Retic Ct Pct: 0.7 % (ref 0.6–2.6)
Total Iron Binding Capacity: 375 ug/dL (ref 250–450)
UIBC: 255 ug/dL (ref 118–369)
Vitamin B-12: 310 pg/mL (ref 211–946)
WBC: 9.6 10*3/uL (ref 3.4–10.8)

## 2015-08-20 LAB — THYROID PANEL WITH TSH
Free Thyroxine Index: 2.8 (ref 1.2–4.9)
T3 Uptake Ratio: 30 % (ref 24–39)
T4, Total: 9.3 ug/dL (ref 4.5–12.0)
TSH: 0.963 u[IU]/mL (ref 0.450–4.500)

## 2015-08-20 LAB — CMP14+EGFR
ALT: 20 IU/L (ref 0–32)
AST: 23 IU/L (ref 0–40)
Albumin/Globulin Ratio: 1.5 (ref 1.2–2.2)
Albumin: 4.3 g/dL (ref 3.5–4.8)
Alkaline Phosphatase: 63 IU/L (ref 39–117)
BUN/Creatinine Ratio: 10 — ABNORMAL LOW (ref 12–28)
BUN: 8 mg/dL (ref 8–27)
Bilirubin Total: 0.9 mg/dL (ref 0.0–1.2)
CO2: 22 mmol/L (ref 18–29)
Calcium: 9.4 mg/dL (ref 8.7–10.3)
Chloride: 103 mmol/L (ref 96–106)
Creatinine, Ser: 0.77 mg/dL (ref 0.57–1.00)
GFR calc Af Amer: 88 mL/min/{1.73_m2} (ref >59)
GFR calc non Af Amer: 76 mL/min/{1.73_m2} (ref >59)
Globulin, Total: 2.8 g/dL (ref 1.5–4.5)
Glucose: 117 mg/dL — ABNORMAL HIGH (ref 65–99)
Potassium: 4.2 mmol/L (ref 3.5–5.2)
Sodium: 144 mmol/L (ref 134–144)
Total Protein: 7.1 g/dL (ref 6.0–8.5)

## 2015-08-20 LAB — LIPID PANEL
Chol/HDL Ratio: 3.4 ratio units (ref 0.0–4.4)
Cholesterol, Total: 169 mg/dL (ref 100–199)
HDL: 50 mg/dL (ref >39)
LDL Calculated: 91 mg/dL (ref 0–99)
Triglycerides: 139 mg/dL (ref 0–149)
VLDL Cholesterol Cal: 28 mg/dL (ref 5–40)

## 2015-08-22 DIAGNOSIS — M4316 Spondylolisthesis, lumbar region: Secondary | ICD-10-CM | POA: Diagnosis not present

## 2015-08-22 DIAGNOSIS — M5416 Radiculopathy, lumbar region: Secondary | ICD-10-CM | POA: Diagnosis not present

## 2015-08-22 LAB — PAP IG (IMAGE GUIDED): PAP Smear Comment: 0

## 2015-09-06 ENCOUNTER — Other Ambulatory Visit: Payer: Self-pay | Admitting: Family Medicine

## 2015-09-06 ENCOUNTER — Other Ambulatory Visit: Payer: Self-pay | Admitting: Family

## 2015-09-09 NOTE — Telephone Encounter (Signed)
rx called into pharmacy

## 2015-09-09 NOTE — Telephone Encounter (Signed)
Last filled 08/08/15, last seen 08/19/15. Route to pool, call in at [facility]

## 2015-09-16 DIAGNOSIS — M5416 Radiculopathy, lumbar region: Secondary | ICD-10-CM | POA: Diagnosis not present

## 2015-09-16 DIAGNOSIS — M4316 Spondylolisthesis, lumbar region: Secondary | ICD-10-CM | POA: Diagnosis not present

## 2015-09-19 DIAGNOSIS — L570 Actinic keratosis: Secondary | ICD-10-CM | POA: Diagnosis not present

## 2015-09-19 DIAGNOSIS — S40862A Insect bite (nonvenomous) of left upper arm, initial encounter: Secondary | ICD-10-CM | POA: Diagnosis not present

## 2015-09-19 DIAGNOSIS — S40861A Insect bite (nonvenomous) of right upper arm, initial encounter: Secondary | ICD-10-CM | POA: Diagnosis not present

## 2015-09-19 DIAGNOSIS — X32XXXD Exposure to sunlight, subsequent encounter: Secondary | ICD-10-CM | POA: Diagnosis not present

## 2015-09-19 DIAGNOSIS — D225 Melanocytic nevi of trunk: Secondary | ICD-10-CM | POA: Diagnosis not present

## 2015-09-23 ENCOUNTER — Other Ambulatory Visit: Payer: Self-pay | Admitting: Family Medicine

## 2015-09-23 DIAGNOSIS — J309 Allergic rhinitis, unspecified: Secondary | ICD-10-CM

## 2015-10-07 ENCOUNTER — Telehealth: Payer: Self-pay | Admitting: Family

## 2015-10-07 ENCOUNTER — Other Ambulatory Visit: Payer: Self-pay | Admitting: Family

## 2015-10-07 DIAGNOSIS — M5416 Radiculopathy, lumbar region: Secondary | ICD-10-CM | POA: Diagnosis not present

## 2015-10-07 DIAGNOSIS — M545 Low back pain: Secondary | ICD-10-CM | POA: Diagnosis not present

## 2015-10-07 DIAGNOSIS — M4316 Spondylolisthesis, lumbar region: Secondary | ICD-10-CM | POA: Diagnosis not present

## 2015-10-08 NOTE — Telephone Encounter (Signed)
Faxed yesterday

## 2015-10-09 ENCOUNTER — Telehealth: Payer: Self-pay | Admitting: Family

## 2015-10-09 NOTE — Telephone Encounter (Signed)
rx on [facility] desk for signature

## 2015-10-14 ENCOUNTER — Other Ambulatory Visit: Payer: Self-pay | Admitting: Family

## 2015-10-15 ENCOUNTER — Other Ambulatory Visit: Payer: Self-pay | Admitting: Family

## 2015-10-15 NOTE — Telephone Encounter (Signed)
Form signed last week

## 2015-10-15 NOTE — Telephone Encounter (Signed)
Patient aware, script for lift chair available at check here at [facility].

## 2015-10-25 DIAGNOSIS — M150 Primary generalized (osteo)arthritis: Secondary | ICD-10-CM | POA: Diagnosis not present

## 2015-11-05 ENCOUNTER — Other Ambulatory Visit: Payer: Self-pay | Admitting: Family

## 2015-11-06 ENCOUNTER — Other Ambulatory Visit: Payer: Self-pay | Admitting: Family Medicine

## 2015-11-07 ENCOUNTER — Encounter: Payer: Self-pay | Admitting: Family

## 2015-11-07 ENCOUNTER — Ambulatory Visit (INDEPENDENT_AMBULATORY_CARE_PROVIDER_SITE_OTHER): Payer: Medicare Other | Admitting: Family

## 2015-11-07 VITALS — BP 126/84 | HR 86 | Temp 97.4°F | Ht 64.0 in | Wt 175.2 lb

## 2015-11-07 DIAGNOSIS — F329 Major depressive disorder, single episode, unspecified: Secondary | ICD-10-CM | POA: Diagnosis not present

## 2015-11-07 DIAGNOSIS — M810 Age-related osteoporosis without current pathological fracture: Secondary | ICD-10-CM

## 2015-11-07 DIAGNOSIS — E039 Hypothyroidism, unspecified: Secondary | ICD-10-CM

## 2015-11-07 DIAGNOSIS — M150 Primary generalized (osteo)arthritis: Secondary | ICD-10-CM | POA: Diagnosis not present

## 2015-11-07 DIAGNOSIS — Z79899 Other long term (current) drug therapy: Secondary | ICD-10-CM

## 2015-11-07 DIAGNOSIS — M8949 Other hypertrophic osteoarthropathy, multiple sites: Secondary | ICD-10-CM

## 2015-11-07 DIAGNOSIS — E8881 Metabolic syndrome: Secondary | ICD-10-CM | POA: Diagnosis not present

## 2015-11-07 DIAGNOSIS — G629 Polyneuropathy, unspecified: Secondary | ICD-10-CM

## 2015-11-07 DIAGNOSIS — Z0289 Encounter for other administrative examinations: Secondary | ICD-10-CM

## 2015-11-07 DIAGNOSIS — G8929 Other chronic pain: Secondary | ICD-10-CM

## 2015-11-07 DIAGNOSIS — I10 Essential (primary) hypertension: Secondary | ICD-10-CM

## 2015-11-07 DIAGNOSIS — F1120 Opioid dependence, uncomplicated: Secondary | ICD-10-CM

## 2015-11-07 DIAGNOSIS — M549 Dorsalgia, unspecified: Secondary | ICD-10-CM

## 2015-11-07 DIAGNOSIS — F32A Depression, unspecified: Secondary | ICD-10-CM

## 2015-11-07 DIAGNOSIS — F411 Generalized anxiety disorder: Secondary | ICD-10-CM

## 2015-11-07 DIAGNOSIS — M159 Polyosteoarthritis, unspecified: Secondary | ICD-10-CM

## 2015-11-07 DIAGNOSIS — E876 Hypokalemia: Secondary | ICD-10-CM

## 2015-11-07 DIAGNOSIS — Z6837 Body mass index (BMI) 37.0-37.9, adult: Secondary | ICD-10-CM

## 2015-11-07 MED ORDER — ALPRAZOLAM 0.5 MG PO TABS: ORAL_TABLET | ORAL | 3 refills | Status: DC

## 2015-11-07 MED ORDER — TRAMADOL HCL 50 MG PO TABS: 100.0000 mg | ORAL_TABLET | Freq: Three times a day (TID) | ORAL | 3 refills | Status: DC | PRN

## 2015-11-07 NOTE — Patient Instructions (Signed)
Hypertension Hypertension, commonly called high blood pressure, is when the force of blood pumping through your arteries is too strong. Your arteries are the blood vessels that carry blood from your heart throughout your body. A blood pressure reading consists of a higher number over a lower number, such as 110/72. The higher number (systolic) is the pressure inside your arteries when your heart pumps. The lower number (diastolic) is the pressure inside your arteries when your heart relaxes. Ideally you want your blood pressure below 120/80. Hypertension forces your heart to work harder to pump blood. Your arteries may become narrow or stiff. Having untreated or uncontrolled hypertension can cause heart attack, stroke, kidney disease, and other problems. RISK FACTORS Some risk factors for high blood pressure are controllable. Others are not.  Risk factors you cannot control include:   Race. You may be at higher risk if you are African American.  Age. Risk increases with age.  Gender. Men are at higher risk than women before age 45 years. After age 65, women are at higher risk than men. Risk factors you can control include:  Not getting enough exercise or physical activity.  Being overweight.  Getting too much fat, sugar, calories, or salt in your diet.  Drinking too much alcohol. SIGNS AND SYMPTOMS Hypertension does not usually cause signs or symptoms. Extremely high blood pressure (hypertensive crisis) may cause headache, anxiety, shortness of breath, and nosebleed. DIAGNOSIS To check if you have hypertension, your health care provider will measure your blood pressure while you are seated, with your arm held at the level of your heart. It should be measured at least twice using the same arm. Certain conditions can cause a difference in blood pressure between your right and left arms. A blood pressure reading that is higher than normal on one occasion does not mean that you need treatment. If  it is not clear whether you have high blood pressure, you may be asked to return on a different day to have your blood pressure checked again. Or, you may be asked to monitor your blood pressure at home for 1 or more weeks. TREATMENT Treating high blood pressure includes making lifestyle changes and possibly taking medicine. Living a healthy lifestyle can help lower high blood pressure. You may need to change some of your habits. Lifestyle changes may include:  Following the DASH diet. This diet is high in fruits, vegetables, and whole grains. It is low in salt, red meat, and added sugars.  Keep your sodium intake below 2,300 mg per day.  Getting at least 30-45 minutes of aerobic exercise at least 4 times per week.  Losing weight if necessary.  Not smoking.  Limiting alcoholic beverages.  Learning ways to reduce stress. Your health care provider may prescribe medicine if lifestyle changes are not enough to get your blood pressure under control, and if one of the following is true:  You are 18-59 years of age and your systolic blood pressure is above 140.  You are 60 years of age or older, and your systolic blood pressure is above 150.  Your diastolic blood pressure is above 90.  You have diabetes, and your systolic blood pressure is over 140 or your diastolic blood pressure is over 90.  You have kidney disease and your blood pressure is above 140/90.  You have heart disease and your blood pressure is above 140/90. Your personal target blood pressure may vary depending on your medical conditions, your age, and other factors. HOME CARE INSTRUCTIONS    Have your blood pressure rechecked as directed by your health care provider.   Take medicines only as directed by your health care provider. Follow the directions carefully. Blood pressure medicines must be taken as prescribed. The medicine does not work as well when you skip doses. Skipping doses also puts you at risk for  problems.  Do not smoke.   Monitor your blood pressure at home as directed by your health care provider. SEEK MEDICAL CARE IF:   You think you are having a reaction to medicines taken.  You have recurrent headaches or feel dizzy.  You have swelling in your ankles.  You have trouble with your vision. SEEK IMMEDIATE MEDICAL CARE IF:  You develop a severe headache or confusion.  You have unusual weakness, numbness, or feel faint.  You have severe chest or abdominal pain.  You vomit repeatedly.  You have trouble breathing. MAKE SURE YOU:   Understand these instructions.  Will watch your condition.  Will get help right away if you are not doing well or get worse.   This information is not intended to replace advice given to you by your health care provider. Make sure you discuss any questions you have with your health care provider.   Document Released: 02/09/2005 Document Revised: 06/26/2014 Document Reviewed: 12/02/2012 Elsevier Interactive Patient Education 2016 [employer]  

## 2015-11-07 NOTE — Progress Notes (Addendum)
Subjective:    Patient ID: Sheila [name], female    DOB: 1940-09-11, 75 y.o.   MRN: [id number]   Patient here today for follow up of chronic medical problems.   Hypertension  This is a chronic problem. The current episode started more than 1 year ago. The problem has been resolved since onset. The problem is controlled. Associated symptoms include anxiety. Pertinent negatives include no chest pain, headaches, palpitations, peripheral edema or shortness of breath. Risk factors for coronary artery disease include dyslipidemia, post-menopausal state, obesity and sedentary lifestyle. Past treatments include diuretics. The current treatment provides moderate improvement. Compliance problems include diet and exercise.  Hypertensive end-organ damage includes CAD/MI and a thyroid problem. There is no history of CVA or PVD.  Hyperlipidemia  This is a chronic problem. The current episode started more than 1 year ago. The problem is controlled. Recent lipid tests were reviewed and are normal. Exacerbating diseases include obesity. Associated symptoms include leg pain. Pertinent negatives include no chest pain, myalgias or shortness of breath. Current antihyperlipidemic treatment includes statins. The current treatment provides moderate improvement of lipids. Compliance problems include adherence to diet and adherence to exercise.  Risk factors for coronary artery disease include dyslipidemia, hypertension and post-menopausal.  Gastroesophageal Reflux  She complains of a hoarse voice. She reports no chest pain, no choking or no heartburn. This is a chronic problem. The problem occurs occasionally. Nothing aggravates the symptoms. Pertinent negatives include no fatigue, melena or orthopnea. There are no known risk factors. She has tried a PPI for the symptoms. The treatment provided moderate relief.  Thyroid Problem  Presents for follow-up (hypothyroidism) visit. Symptoms include anxiety, depressed mood, diarrhea  and hoarse voice. Patient reports no dry skin, fatigue, palpitations or visual change. The symptoms have been stable. Her past medical history is significant for hyperlipidemia.  Anxiety  Presents for follow-up visit. Onset was more than 5 years ago. Symptoms include depressed mood, nervous/anxious behavior and restlessness. Patient reports no chest pain, excessive worry, irritability, palpitations or shortness of breath. Symptoms occur occasionally.   Her past medical history is significant for anxiety/panic attacks and depression. Past treatments include SSRIs.  Depression       The patient presents with depression.  This is a chronic problem.  The current episode started more than 1 year ago.   The onset quality is gradual.   The problem occurs intermittently.  The problem has been waxing and waning since onset.  Associated symptoms include restlessness.  Associated symptoms include no fatigue, no helplessness, no hopelessness, not irritable, no myalgias, no headaches and not sad.  Past treatments include SSRIs - Selective serotonin reuptake inhibitors.  Compliance with treatment is good.  Past medical history includes thyroid problem, anxiety and depression.   Arthritis  Presents for follow-up visit. She complains of pain. Affected locations include the right knee, left knee and right hip. Her pain is at a severity of 8/10. Associated symptoms include diarrhea. Pertinent negatives include no fatigue.  Back Pain  This is a chronic problem. The current episode started more than 1 year ago. The problem occurs constantly. The problem is unchanged. The pain is present in the lumbar spine. The pain is at a severity of 8/10. The pain is moderate. The symptoms are aggravated by standing and sitting. Associated symptoms include leg pain. Pertinent negatives include no chest pain or headaches. She has tried analgesics and bed rest for the symptoms. The treatment provided moderate relief.  Hypokalemia K+  supplement- no  c/o muscle cramps Neuropathy Pt taking gabapentin. States it helps  with  pain in bil legs. Metabolic Syndrome Pt currently not taking anything for cholesterol at this time related to leg pain. Pt denies any exercises related to arthritis and back pain.  Osteoporosis Pt currently taking Prolia every 6 months. Lase Dexascan 06/30/15.    Review of Systems  Constitutional: Negative.  Negative for fatigue and irritability.  HENT: Positive for hoarse voice.   Eyes: Negative.   Respiratory: Negative.  Negative for choking and shortness of breath.   Cardiovascular: Negative.  Negative for chest pain and palpitations.  Gastrointestinal: Positive for diarrhea. Negative for heartburn and melena.  Endocrine: Negative.   Genitourinary: Negative.   Musculoskeletal: Positive for arthritis and back pain. Negative for myalgias.  Neurological: Negative.  Negative for headaches.  Hematological: Negative.   Psychiatric/Behavioral: Positive for depression. The patient is nervous/anxious.   All other systems reviewed and are negative.      Objective:   Physical Exam  Constitutional: She is oriented to person, place, and time. She appears well-developed and well-nourished. She is not irritable. No distress.  HENT:  Head: Normocephalic and atraumatic.  Right Ear: External ear normal.  Mouth/Throat: Oropharynx is clear and moist.  Eyes: Pupils are equal, round, and reactive to light.  Neck: Normal range of motion. Neck supple. No thyromegaly present.  Cardiovascular: Normal rate, regular rhythm, normal heart sounds and intact distal pulses.   No murmur heard. Pulmonary/Chest: Effort normal and breath sounds normal. No respiratory distress. She has no wheezes.  Abdominal: Soft. Bowel sounds are normal. She exhibits no distension. There is no tenderness.  Genitourinary:  Genitourinary Comments:    Musculoskeletal: Normal range of motion. She exhibits no edema or tenderness.  FROM of  right hip with ain on abduction- mild point tenderness along right buttocks   Neurological: She is alert and oriented to person, place, and time. She has normal reflexes. No cranial nerve deficit.  Skin: Skin is warm and dry.  Psychiatric: She has a normal mood and affect. Her behavior is normal. Judgment and thought content normal.  Vitals reviewed.     BP 126/84 (BP Location: Left Arm, Patient Position: Sitting, Cuff Size: Normal)   Pulse 86   Temp 97.4 F (36.3 C) (Oral)   Ht '5\' 4"'$  (1.626 m)   Wt 175 lb 3.2 oz (79.5 kg)   BMI 30.07 kg/m      Assessment & Plan:  1. Essential hypertension, benign - CMP14+EGFR  2. Hypothyroidism, unspecified hypothyroidism type - CMP14+EGFR  3. Neuropathy (HCC) - CMP14+EGFR  4. Primary osteoarthritis involving multiple joints - CMP14+EGFR - traMADol (ULTRAM) 50 MG tablet; Take 2 tablets (100 mg total) by mouth every 8 (eight) hours as needed for moderate pain or severe pain.  Dispense: 120 tablet; Refill: 3  5. Osteoporosis - CMP14+EGFR  6. BMI 37.0-37.9, adult - CMP14+EGFR  7. Chronic back pain - CMP14+EGFR - traMADol (ULTRAM) 50 MG tablet; Take 2 tablets (100 mg total) by mouth every 8 (eight) hours as needed for moderate pain or severe pain.  Dispense: 120 tablet; Refill: 3  8. Depressed - CMP14+EGFR  9. GAD (generalized anxiety disorder) - CMP14+EGFR  10. Pain medication agreement signed - CMP14+EGFR - traMADol (ULTRAM) 50 MG tablet; Take 2 tablets (100 mg total) by mouth every 8 (eight) hours as needed for moderate pain or severe pain.  Dispense: 120 tablet; Refill: 3  11. Uncomplicated opioid dependence (HCC) - CMP14+EGFR - traMADol (ULTRAM) 50 MG  tablet; Take 2 tablets (100 mg total) by mouth every 8 (eight) hours as needed for moderate pain or severe pain.  Dispense: 120 tablet; Refill: 3  12. Metabolic syndrome - OEH21+YYQM  13. Hypokalemia - CMP14+EGFR   Reviewed in [place], Pt has  only received pain medication from me  Continue all meds Labs pending Health Maintenance reviewed Diet and exercise encouraged RTO 3 months  [name], FNP

## 2015-11-08 LAB — CMP14+EGFR
ALT: 21 IU/L (ref 0–32)
AST: 23 IU/L (ref 0–40)
Albumin/Globulin Ratio: 1.7 (ref 1.2–2.2)
Albumin: 4.4 g/dL (ref 3.5–4.8)
Alkaline Phosphatase: 57 IU/L (ref 39–117)
BUN/Creatinine Ratio: 15 (ref 12–28)
BUN: 11 mg/dL (ref 8–27)
Bilirubin Total: 0.7 mg/dL (ref 0.0–1.2)
CO2: 23 mmol/L (ref 18–29)
Calcium: 10.6 mg/dL — ABNORMAL HIGH (ref 8.7–10.3)
Chloride: 98 mmol/L (ref 96–106)
Creatinine, Ser: 0.75 mg/dL (ref 0.57–1.00)
GFR calc Af Amer: 91 mL/min/{1.73_m2} (ref >59)
GFR calc non Af Amer: 79 mL/min/{1.73_m2} (ref >59)
Globulin, Total: 2.6 g/dL (ref 1.5–4.5)
Glucose: 92 mg/dL (ref 65–99)
Potassium: 4 mmol/L (ref 3.5–5.2)
Sodium: 140 mmol/L (ref 134–144)
Total Protein: 7 g/dL (ref 6.0–8.5)

## 2015-11-11 ENCOUNTER — Telehealth: Payer: Self-pay | Admitting: *Deleted

## 2015-11-11 NOTE — Telephone Encounter (Signed)
-----   Message from Sharion Balloon, [place] sent at 11/10/2015  8:04 PM EDT ----- Kidney and liver function stable

## 2015-11-11 NOTE — Telephone Encounter (Signed)
Pt notified of results Verbalizes understanding 

## 2015-11-20 ENCOUNTER — Ambulatory Visit: Payer: Medicare Other | Admitting: Family

## 2015-11-24 DIAGNOSIS — M150 Primary generalized (osteo)arthritis: Secondary | ICD-10-CM | POA: Diagnosis not present

## 2015-11-25 ENCOUNTER — Other Ambulatory Visit: Payer: Self-pay | Admitting: Family Medicine

## 2015-11-25 DIAGNOSIS — J209 Acute bronchitis, unspecified: Secondary | ICD-10-CM

## 2015-12-09 ENCOUNTER — Other Ambulatory Visit: Payer: Self-pay | Admitting: Family Medicine

## 2015-12-12 DIAGNOSIS — M5416 Radiculopathy, lumbar region: Secondary | ICD-10-CM | POA: Diagnosis not present

## 2015-12-12 DIAGNOSIS — M4316 Spondylolisthesis, lumbar region: Secondary | ICD-10-CM | POA: Diagnosis not present

## 2015-12-23 ENCOUNTER — Other Ambulatory Visit: Payer: Self-pay | Admitting: Family

## 2015-12-23 DIAGNOSIS — J209 Acute bronchitis, unspecified: Secondary | ICD-10-CM

## 2015-12-25 DIAGNOSIS — M150 Primary generalized (osteo)arthritis: Secondary | ICD-10-CM | POA: Diagnosis not present

## 2016-01-01 ENCOUNTER — Other Ambulatory Visit: Payer: Self-pay | Admitting: Family

## 2016-01-02 ENCOUNTER — Encounter: Payer: Self-pay | Admitting: Family

## 2016-01-02 ENCOUNTER — Ambulatory Visit (INDEPENDENT_AMBULATORY_CARE_PROVIDER_SITE_OTHER): Payer: Medicare Other | Admitting: Family

## 2016-01-02 VITALS — BP 136/82 | HR 90 | Temp 98.1°F | Ht 64.0 in | Wt 176.6 lb

## 2016-01-02 DIAGNOSIS — M19011 Primary osteoarthritis, right shoulder: Secondary | ICD-10-CM | POA: Diagnosis not present

## 2016-01-02 MED ORDER — METHYLPREDNISOLONE ACETATE 40 MG/ML IJ SUSP
40.0000 mg | Freq: Once | INTRAMUSCULAR | Status: AC
  Administered 2016-01-02: 40 mg via INTRA_ARTICULAR

## 2016-01-02 MED ORDER — BUPIVACAINE HCL 0.25 % IJ SOLN
1.0000 mL | Freq: Once | INTRAMUSCULAR | Status: AC
  Administered 2016-01-02: 1 mL via INTRA_ARTICULAR

## 2016-01-02 NOTE — Patient Instructions (Signed)

## 2016-01-02 NOTE — Progress Notes (Signed)
   Subjective:    Patient ID: Sheila [name], female    DOB: 1940/08/14, 75 y.o.   MRN: [id number]  Shoulder Pain   The pain is present in the right shoulder. This is a new problem. The current episode started in the past 7 days. There has been no history of extremity trauma. The problem occurs constantly. The problem has been waxing and waning. The quality of the pain is described as aching. The pain is at a severity of 9/10. The pain is moderate. Associated symptoms include an inability to bear weight, a limited range of motion and stiffness. Pertinent negatives include no joint locking, joint swelling, numbness or tingling. The symptoms are aggravated by activity. She has tried rest and acetaminophen for the symptoms. The treatment provided mild relief. Her past medical history is significant for osteoarthritis.      Review of Systems  Musculoskeletal: Positive for arthralgias, joint swelling and stiffness.  Neurological: Negative for tingling and numbness.  All other systems reviewed and are negative.      Objective:   Physical Exam  Constitutional: She is oriented to person, place, and time. She appears well-developed and well-nourished. No distress.  HENT:  Head: Normocephalic and atraumatic.  Eyes: Pupils are equal, round, and reactive to light.  Neck: Normal range of motion. Neck supple. No thyromegaly present.  Cardiovascular: Normal rate, regular rhythm, normal heart sounds and intact distal pulses.   No murmur heard. Pulmonary/Chest: Effort normal and breath sounds normal. No respiratory distress. She has no wheezes.  Abdominal: Soft. Bowel sounds are normal. She exhibits no distension. There is no tenderness.  Musculoskeletal: She exhibits no edema or tenderness.  Decreased ROM with flexion and abduction   Neurological: She is alert and oriented to person, place, and time.  Skin: Skin is warm and dry.  Psychiatric: She has a normal mood and affect. Her behavior is normal.  Judgment and thought content normal.  Vitals reviewed.   rightshoulder prepped with betadine Injected with Marcaine .5% plain and methylprednisolone with 22 guage needle . Patient tolerated well.   BP 136/82   Pulse 90   Temp 98.1 F (36.7 C) (Oral)   Ht 5\' 4"  (1.626 m)   Wt 176 lb 9.6 oz (80.1 kg)   BMI 30.31 kg/m      Assessment & Plan:  1. Primary osteoarthritis of right shoulder -Rest -Ice and heat -ROM exercises encouraged -RTO prn  - methylPREDNISolone acetate (DEPO-MEDROL) injection 40 mg; Inject 1 mL (40 mg total) into the articular space once. - bupivacaine (MARCAINE) 0.25 % (with pres) injection 1 mL; Inject 1 mL into the articular space once.  [name], FNP

## 2016-01-04 ENCOUNTER — Other Ambulatory Visit: Payer: Self-pay | Admitting: Family

## 2016-01-04 DIAGNOSIS — K279 Peptic ulcer, site unspecified, unspecified as acute or chronic, without hemorrhage or perforation: Secondary | ICD-10-CM

## 2016-01-06 ENCOUNTER — Other Ambulatory Visit: Payer: Self-pay | Admitting: Pharmacist

## 2016-01-06 MED ORDER — DENOSUMAB 60 MG/ML ~~LOC~~ SOLN: 60.0000 mg | SUBCUTANEOUS | 0 refills | Status: DC

## 2016-01-07 ENCOUNTER — Ambulatory Visit (INDEPENDENT_AMBULATORY_CARE_PROVIDER_SITE_OTHER): Payer: Medicare Other | Admitting: Nurse Practitioner

## 2016-01-07 ENCOUNTER — Encounter: Payer: Self-pay | Admitting: Nurse Practitioner

## 2016-01-07 VITALS — BP 150/90 | HR 102 | Temp 97.3°F | Ht 64.0 in | Wt 174.0 lb

## 2016-01-07 DIAGNOSIS — M79601 Pain in right arm: Secondary | ICD-10-CM

## 2016-01-07 MED ORDER — MELOXICAM 15 MG PO TABS: 15.0000 mg | ORAL_TABLET | Freq: Every day | ORAL | 3 refills | Status: DC

## 2016-01-07 NOTE — Progress Notes (Signed)
   Subjective:    Patient ID: Sheila [name], female    DOB: [birth year], 75 y.o.   MRN: [id number]  HPI Patient comes in today to recheck knot in her arm- she was seen by C. Hawks and she was given a joint injection into right shoulder- that has not helped the painful knot in her arm. She denies injury to right arm.    Review of Systems  Constitutional: Negative.   HENT: Negative.   Respiratory: Negative.   Cardiovascular: Negative.   Genitourinary: Negative.   Musculoskeletal: Positive for myalgias.  Neurological: Negative.   Psychiatric/Behavioral: Negative.   All other systems reviewed and are negative.      Objective:   Physical Exam  Constitutional: She is oriented to person, place, and time. She appears well-developed and well-nourished.  Cardiovascular: Normal rate, regular rhythm and normal heart sounds.   Pulmonary/Chest: Effort normal and breath sounds normal.  Musculoskeletal:  Tender nodule right upper arm above elbow-  FROM of shoulder with pain on external rotation- cause pain in upper arm but not shoulder grips equal bil Motor strength and sensation distally intact.  Neurological: She is oriented to person, place, and time.  Skin: Skin is warm.  Psychiatric: She has a normal mood and affect. Her behavior is normal. Judgment and thought content normal.    BP (!) 150/90 (BP Location: Left Arm, Cuff Size: Normal)   Pulse (!) 102   Temp 97.3 F (36.3 C) (Oral)   Ht 5\' 4"  (1.626 m)   Wt 174 lb (78.9 kg)   BMI 29.87 kg/m         Assessment & Plan:  1. Arm pain, musculoskeletal, right take meds with food Do not use goody powders - meloxicam (MOBIC) 15 MG tablet; Take 1 tablet (15 mg total) by mouth daily.  Dispense: 30 tablet; Refill: 3 Moist heat to area Rest arm Wrap with ace wrap if helps RTO prn  Mary-Margaret Hassell Done, FNP

## 2016-01-09 ENCOUNTER — Encounter: Payer: Self-pay | Admitting: Pharmacist

## 2016-01-09 ENCOUNTER — Ambulatory Visit (INDEPENDENT_AMBULATORY_CARE_PROVIDER_SITE_OTHER): Payer: Medicare Other | Admitting: Pharmacist

## 2016-01-09 VITALS — Ht 59.5 in | Wt 175.8 lb

## 2016-01-09 DIAGNOSIS — M810 Age-related osteoporosis without current pathological fracture: Secondary | ICD-10-CM

## 2016-01-09 MED ORDER — DENOSUMAB 60 MG/ML ~~LOC~~ SOLN
60.0000 mg | Freq: Once | SUBCUTANEOUS | Status: AC
  Administered 2016-01-09: 60 mg via SUBCUTANEOUS

## 2016-01-09 NOTE — Progress Notes (Signed)
Patient ID: Sheila [name], female   DOB: 02-20-1941, 75 y.o.   MRN: [id number]  [facility] Current Height: Height: 4' 11.5" (151.1 cm)      Max Lifetime Height:  5' 1.5" Current Weight: Weight: 175 lb 12 oz (79.7 kg)       Ethnicity:Caucasian    HPI: Patient is here today for Prolia injeciton and follow up of osteoporosis.  She has been taking Prolia 60mg  injections every 6 months since 05/2011.  Her last injection was 07/10/2015  She took alendronate in past but experienced treatment failue / continued decrease in BMD dispite compliance with therapy.    Sheila [name] has not experienced any fractures.  She does report occasional back pain. No history of back surgery.                                                            PMH: Age at menopause:   39's Hysterectomy?  No Oophorectomy?  No HRT? No - not orally but is using estradiol cream vaginally - 3 times per week Steroid Use?  No Thyroid med?  Yes   Current Outpatient Prescriptions:  .  acetaminophen (TYLENOL) 650 MG CR tablet, Take 1,300 mg by mouth 2 (two) times daily. , Disp: , Rfl:  .  albuterol (PROVENTIL HFA;VENTOLIN HFA) 108 (90 Base) MCG/ACT inhaler, Inhale 2 puffs into the lungs every 6 (six) hours as needed for wheezing., Disp: 1 Inhaler, Rfl: 2 .  albuterol (PROVENTIL) (2.5 MG/3ML) 0.083% nebulizer solution, Nebulize 1 vial every 6-8 hours as needed for wheezing or shortness of breath., Disp: 180 mL, Rfl: 2 .  ALPRAZolam (XANAX) 0.5 MG tablet, TAKE  (1)  TABLET TWICE A DAY. (Patient taking differently: Take 0.5-1 mg by mouth at bedtime as needed. TAKE  (1)  TABLET TWICE A DAY.), Disp: 60 tablet, Rfl: 3 .  azelastine (ASTELIN) 0.1 % nasal spray, USE 1 TO 2 SPRAYS IN EACH NOSTRIL AT BEDTIME, Disp: 30 mL, Rfl: 4 .  citalopram (CELEXA) 20 MG tablet, TAKE 1 TABLET DAILY, Disp: 90 tablet, Rfl: 0 .  clotrimazole-betamethasone (LOTRISONE) cream, Apply topically 2 (two) times daily., Disp: 45 g, Rfl: 3 .  denosumab (PROLIA)  60 MG/ML SOLN injection, Inject 60 mg into the skin every 6 (six) months. Bring to office for administration., Disp: 1 mL, Rfl: 0 .  estradiol (ESTRACE) 0.1 MG/GM vaginal cream, Place 1 Applicatorful vaginally 3 (three) times a week. Reported on 08/07/2015, Disp: , Rfl:  .  fluticasone (FLONASE) 50 MCG/ACT nasal spray, USE 1 TO 2 SPRAYS IN EACH NOSTRIL AT BEDTIME, Disp: 16 g, Rfl: 4 .  furosemide (LASIX) 20 MG tablet, TAKE 1 TABLET DAILY, Disp: 90 tablet, Rfl: 0 .  gabapentin (NEURONTIN) 300 MG capsule, TAKE (1) CAPSULE THREE TIMES DAILY., Disp: 90 capsule, Rfl: 0 .  hydroxypropyl methylcellulose / hypromellose (ISOPTO TEARS / GONIOVISC) 2.5 % ophthalmic solution, Place 1 drop into both eyes as needed for dry eyes., Disp: , Rfl:  .  levothyroxine (SYNTHROID, LEVOTHROID) 75 MCG tablet, TAKE 1 TABLET DAILY, Disp: 30 tablet, Rfl: 11 .  meloxicam (MOBIC) 15 MG tablet, Take 1 tablet (15 mg total) by mouth daily., Disp: 30 tablet, Rfl: 3 .  Multiple Vitamins-Minerals (CENTRUM SILVER ADULT 50+ PO), Take 1 tablet by mouth every other day.,  Disp: , Rfl:  .  Omega-3 Fatty Acids (FISH OIL PO), Take 2 capsules by mouth 2 (two) times daily., Disp: , Rfl:  .  omeprazole (PRILOSEC) 40 MG capsule, TAKE (1) CAPSULE DAILY, Disp: 30 capsule, Rfl: 3 .  potassium chloride SA (K-DUR,KLOR-CON) 20 MEQ tablet, TAKE  (1)  TABLET TWICE A DAY., Disp: 60 tablet, Rfl: 2 .  SYMBICORT 80-4.5 MCG/ACT inhaler, INHALE 2 PUFFS 2 TIMES A DAY, Disp: 10.2 g, Rfl: 4 .  traMADol (ULTRAM) 50 MG tablet, Take 2 tablets (100 mg total) by mouth every 8 (eight) hours as needed for moderate pain or severe pain., Disp: 120 tablet, Rfl: 3  History of cancer?  Yes - skin cancer on face History of digestive disorders (ie Crohn's)?  Yes - GERD on daily PPI Current or previous eating disorders?  No Last Vitamin D Result:  20.5 (08/2013) Last GFR Result:  79 (11/07/2015)   FH/SH: Family history of osteoporosis?  Yes - sister Parent with history of  hip fracture?  No Family history of breast cancer?  No Exercise?  Yes - every evening Smoking?  No Alcohol?  No    Calcium Assessment Calcium Intake  # of servings/day  Calcium mg  Milk (8 oz) 1  x  300  = 300mg   Yogurt (4 oz) 0 x  200 = 0  Cheese (1 oz) 0 x  200 = 0  Other Calcium sources   250mg   Ca supplement MVI  QD Ensure bid 200mg  X 2 = 400mg  400mg    Estimated calcium intake per day 1350mg     DEXA Results Date of Test T-Score for AP Spine L1-L4 T-Score for Neck Left Hip / Femur  07/10/2015 0.0 -2.4  06/14/2013 0.0 -2.6  04/15/2011 -0.2 -2.7  05/18/2007 0.0 -2.2  12/25/2003 -0.8 -2.3    Assessment: Osteoporosis - tolerating Prolia with improved BMD at left neck of hip and stable BMD at AP Spine Low vitamin D - last checked 2015  Recommendations: 1.  Prolia injection given in office today - left arm, patient brought in medication (filled at her Pharmacy).  DEXA due 05/2014 - decide if will continue Prolia a that time 2.  Continue current calcium intake (has history of hypercalcemia so would not recommend supplementation at this time.  Prolia could help to decrease serum calcium)  3.  recommend weight bearing exercise - 30 minutes at least 4 days per week.   4.  Counseled and educated about fall risk and prevention. 5.  Continue vitamin D supplementation - checking vitamin D level today.  Recheck DEXA:  2019  Time spent counseling patient:  20 minutes  Sheila [name], PharmD, CPP         Patient ID: Sheila [name], female   DOB: March 16, 1940, 75 y.o.   MRN: [id number]

## 2016-01-10 LAB — VITAMIN D 25 HYDROXY (VIT D DEFICIENCY, FRACTURES): Vit D, 25-Hydroxy: 20.9 ng/mL — ABNORMAL LOW (ref 30.0–100.0)

## 2016-01-14 DIAGNOSIS — L03032 Cellulitis of left toe: Secondary | ICD-10-CM | POA: Diagnosis not present

## 2016-01-14 DIAGNOSIS — B351 Tinea unguium: Secondary | ICD-10-CM | POA: Diagnosis not present

## 2016-01-24 DIAGNOSIS — M150 Primary generalized (osteo)arthritis: Secondary | ICD-10-CM | POA: Diagnosis not present

## 2016-01-27 DIAGNOSIS — I10 Essential (primary) hypertension: Secondary | ICD-10-CM | POA: Diagnosis not present

## 2016-01-27 DIAGNOSIS — M5416 Radiculopathy, lumbar region: Secondary | ICD-10-CM | POA: Diagnosis not present

## 2016-01-27 DIAGNOSIS — M545 Low back pain: Secondary | ICD-10-CM | POA: Diagnosis not present

## 2016-01-28 ENCOUNTER — Ambulatory Visit: Payer: Medicare Other | Admitting: Family

## 2016-01-29 ENCOUNTER — Encounter: Payer: Self-pay | Admitting: Family

## 2016-02-03 ENCOUNTER — Other Ambulatory Visit: Payer: Self-pay | Admitting: Family

## 2016-02-10 ENCOUNTER — Ambulatory Visit (INDEPENDENT_AMBULATORY_CARE_PROVIDER_SITE_OTHER): Payer: Medicare Other | Admitting: Family

## 2016-02-10 ENCOUNTER — Encounter: Payer: Self-pay | Admitting: Family

## 2016-02-10 VITALS — BP 128/75 | HR 75 | Temp 97.7°F | Ht 59.5 in | Wt 179.4 lb

## 2016-02-10 DIAGNOSIS — F411 Generalized anxiety disorder: Secondary | ICD-10-CM | POA: Diagnosis not present

## 2016-02-10 DIAGNOSIS — Z23 Encounter for immunization: Secondary | ICD-10-CM

## 2016-02-10 DIAGNOSIS — E8881 Metabolic syndrome: Secondary | ICD-10-CM

## 2016-02-10 DIAGNOSIS — Z0289 Encounter for other administrative examinations: Secondary | ICD-10-CM

## 2016-02-10 DIAGNOSIS — M150 Primary generalized (osteo)arthritis: Secondary | ICD-10-CM

## 2016-02-10 DIAGNOSIS — I10 Essential (primary) hypertension: Secondary | ICD-10-CM

## 2016-02-10 DIAGNOSIS — M545 Low back pain: Secondary | ICD-10-CM

## 2016-02-10 DIAGNOSIS — E039 Hypothyroidism, unspecified: Secondary | ICD-10-CM

## 2016-02-10 DIAGNOSIS — M810 Age-related osteoporosis without current pathological fracture: Secondary | ICD-10-CM

## 2016-02-10 DIAGNOSIS — F331 Major depressive disorder, recurrent, moderate: Secondary | ICD-10-CM | POA: Diagnosis not present

## 2016-02-10 DIAGNOSIS — M159 Polyosteoarthritis, unspecified: Secondary | ICD-10-CM

## 2016-02-10 DIAGNOSIS — K279 Peptic ulcer, site unspecified, unspecified as acute or chronic, without hemorrhage or perforation: Secondary | ICD-10-CM | POA: Diagnosis not present

## 2016-02-10 DIAGNOSIS — Z6837 Body mass index (BMI) 37.0-37.9, adult: Secondary | ICD-10-CM

## 2016-02-10 DIAGNOSIS — E785 Hyperlipidemia, unspecified: Secondary | ICD-10-CM

## 2016-02-10 DIAGNOSIS — F1120 Opioid dependence, uncomplicated: Secondary | ICD-10-CM

## 2016-02-10 DIAGNOSIS — M8949 Other hypertrophic osteoarthropathy, multiple sites: Secondary | ICD-10-CM

## 2016-02-10 DIAGNOSIS — G8929 Other chronic pain: Secondary | ICD-10-CM

## 2016-02-10 DIAGNOSIS — G629 Polyneuropathy, unspecified: Secondary | ICD-10-CM | POA: Diagnosis not present

## 2016-02-10 MED ORDER — MECLIZINE HCL 25 MG PO TABS: 25.0000 mg | ORAL_TABLET | Freq: Three times a day (TID) | ORAL | 0 refills | Status: DC | PRN

## 2016-02-10 MED ORDER — TRAMADOL HCL 50 MG PO TABS: 100.0000 mg | ORAL_TABLET | Freq: Three times a day (TID) | ORAL | 3 refills | Status: DC | PRN

## 2016-02-10 NOTE — Progress Notes (Signed)
Subjective:    Patient ID: Sheila [name], female    DOB: [birth year], 75 y.o.   MRN: [id number]   Patient here today for follow up of chronic medical problems.   Hypertension  This is a chronic problem. The current episode started more than 1 year ago. The problem has been resolved since onset. The problem is controlled. Associated symptoms include anxiety. Pertinent negatives include no chest pain, headaches, palpitations, peripheral edema or shortness of breath. Risk factors for coronary artery disease include dyslipidemia, post-menopausal state, obesity and sedentary lifestyle. Past treatments include diuretics. The current treatment provides moderate improvement. Compliance problems include diet and exercise.  Hypertensive end-organ damage includes CAD/MI and a thyroid problem. There is no history of CVA or PVD.  Hyperlipidemia  This is a chronic problem. The current episode started more than 1 year ago. The problem is controlled. Recent lipid tests were reviewed and are normal. Exacerbating diseases include obesity. Associated symptoms include leg pain. Pertinent negatives include no chest pain, myalgias or shortness of breath. Current antihyperlipidemic treatment includes statins. The current treatment provides moderate improvement of lipids. Compliance problems include adherence to diet and adherence to exercise.  Risk factors for coronary artery disease include dyslipidemia, hypertension and post-menopausal.  Gastroesophageal Reflux  She complains of a hoarse voice. She reports no chest pain, no choking or no heartburn. This is a chronic problem. The problem occurs occasionally. Nothing aggravates the symptoms. Pertinent negatives include no fatigue, melena or orthopnea. There are no known risk factors. She has tried a PPI for the symptoms. The treatment provided moderate relief.  Thyroid Problem  Presents for follow-up (hypothyroidism) visit. Symptoms include anxiety, depressed mood and hoarse  voice. Patient reports no diarrhea, dry skin, fatigue, palpitations or visual change. The symptoms have been stable. Her past medical history is significant for hyperlipidemia.  Anxiety  Presents for follow-up visit. Onset was more than 5 years ago. Symptoms include depressed mood, excessive worry, nervous/anxious behavior and restlessness. Patient reports no chest pain, irritability, palpitations or shortness of breath. Symptoms occur occasionally.   Her past medical history is significant for anxiety/panic attacks and depression. Past treatments include SSRIs.  Depression       The patient presents with depression.  This is a chronic problem.  The current episode started more than 1 year ago.   The onset quality is gradual.   The problem occurs intermittently.  The problem has been waxing and waning since onset.  Associated symptoms include restlessness.  Associated symptoms include no fatigue, no helplessness, no hopelessness, not irritable, no myalgias, no headaches and not sad.  Past treatments include SSRIs - Selective serotonin reuptake inhibitors.  Compliance with treatment is good.  Past medical history includes thyroid problem, anxiety and depression.   Arthritis  Presents for follow-up visit. She complains of pain. Affected locations include the right knee, left knee and right hip. Her pain is at a severity of 5/10. Pertinent negatives include no diarrhea or fatigue.  Back Pain  This is a chronic problem. The current episode started more than 1 year ago. The problem occurs constantly. The problem is unchanged. The pain is present in the lumbar spine. The pain is at a severity of 0/10. The patient is experiencing no pain. The symptoms are aggravated by standing and sitting. Associated symptoms include leg pain. Pertinent negatives include no chest pain or headaches. She has tried analgesics and bed rest for the symptoms. The treatment provided moderate relief.  Hypokalemia K+ supplement- no c/o  muscle cramps Neuropathy Pt taking gabapentin. States it helps  with  pain in bil legs. Metabolic Syndrome Pt currently not taking anything for cholesterol at this time related to leg pain. Pt denies any exercises related to arthritis and back pain.  Osteoporosis Pt currently taking Prolia every 6 months. Lase Dexascan 06/30/15.  Pain assessment: Pain location- Low back and Ostoarthritis  Pain on scale of 1-10- 5 Frequency-Intermittent What increases pain-Standing and bending What makes pain Better-Pain medication, injections in her back by ortho  Current medications- Ultram 100 mg every 8 hours prn, #120 Effectiveness of current meds-Stable  Pill count performed-No Urine drug screen- No, 04/29/15 Was the [place] reviewed- Yes  If yes were their any concerning findings? - None, pt has only received controlled medication from [facility] office.    Review of Systems  Constitutional: Negative.  Negative for fatigue and irritability.  HENT: Positive for hoarse voice.   Eyes: Negative.   Respiratory: Negative.  Negative for choking and shortness of breath.   Cardiovascular: Negative.  Negative for chest pain and palpitations.  Gastrointestinal: Negative for diarrhea, heartburn and melena.  Endocrine: Negative.   Genitourinary: Negative.   Musculoskeletal: Positive for arthritis and back pain. Negative for myalgias.  Neurological: Negative.  Negative for headaches.  Hematological: Negative.   Psychiatric/Behavioral: Positive for depression. The patient is nervous/anxious.   All other systems reviewed and are negative.      Objective:   Physical Exam  Constitutional: She is oriented to person, place, and time. She appears well-developed and well-nourished. She is not irritable. No distress.  HENT:  Head: Normocephalic and atraumatic.  Right Ear: External ear normal.  Mouth/Throat: Oropharynx is clear and moist.  Eyes: Pupils are equal, round, and reactive to light.  Neck: Normal  range of motion. Neck supple. No thyromegaly present.  Cardiovascular: Normal rate, regular rhythm, normal heart sounds and intact distal pulses.   No murmur heard. Pulmonary/Chest: Effort normal and breath sounds normal. No respiratory distress. She has no wheezes.  Abdominal: Soft. Bowel sounds are normal. She exhibits no distension. There is no tenderness.  Genitourinary:  Genitourinary Comments:    Musculoskeletal: Normal range of motion. She exhibits no edema or tenderness.  FROM of right hip with ain on abduction- mild point tenderness along right buttocks   Neurological: She is alert and oriented to person, place, and time. She has normal reflexes. No cranial nerve deficit.  Skin: Skin is warm and dry.  Psychiatric: She has a normal mood and affect. Her behavior is normal. Judgment and thought content normal.  Vitals reviewed.     BP 128/75   Pulse 75   Temp 97.7 F (36.5 C) (Oral)   Ht 4' 11.5" (1.511 m)   Wt 179 lb 6.4 oz (81.4 kg)   BMI 35.63 kg/m      Assessment & Plan:  1. Essential hypertension, benign - CMP14+EGFR  2. PUD (peptic ulcer disease) - CMP14+EGFR  3. Hypothyroidism, unspecified type - CMP14+EGFR  4. Neuropathy (HCC) - CMP14+EGFR  5. Primary osteoarthritis involving multiple joints - traMADol (ULTRAM) 50 MG tablet; Take 2 tablets (100 mg total) by mouth every 8 (eight) hours as needed for moderate pain or severe pain.  Dispense: 120 tablet; Refill: 3 - CMP14+EGFR  6. Osteoporosis, unspecified osteoporosis type, unspecified pathological fracture presence  - CMP14+EGFR  7. BMI 37.0-37.9, adult - CMP14+EGFR  8. Chronic bilateral low back pain, with sciatica presence unspecified - traMADol (ULTRAM) 50 MG tablet; Take 2 tablets (100 mg  total) by mouth every 8 (eight) hours as needed for moderate pain or severe pain.  Dispense: 120 tablet; Refill: 3 - CMP14+EGFR - Tens unit  9. Moderate episode of recurrent major depressive disorder  (HCC)  - CMP14+EGFR  10. GAD (generalized anxiety disorder) - CMP14+EGFR  11. Hyperlipidemia, unspecified hyperlipidemia type  - CMP14+EGFR  12. Pain medication agreement signed  - traMADol (ULTRAM) 50 MG tablet; Take 2 tablets (100 mg total) by mouth every 8 (eight) hours as needed for moderate pain or severe pain.  Dispense: 120 tablet; Refill: 3 - CMP14+EGFR  13. Uncomplicated opioid dependence (HCC) - traMADol (ULTRAM) 50 MG tablet; Take 2 tablets (100 mg total) by mouth every 8 (eight) hours as needed for moderate pain or severe pain.  Dispense: 120 tablet; Refill: 3 - CMP14+EGFR  14. Metabolic syndrome  - [id number]+JQZE   Continue all meds Labs pending Health Maintenance reviewed Diet and exercise encouraged RTO 3 months   [name], FNP

## 2016-02-10 NOTE — Patient Instructions (Signed)

## 2016-02-11 LAB — CMP14+EGFR
ALT: 15 IU/L (ref 0–32)
AST: 21 IU/L (ref 0–40)
Albumin/Globulin Ratio: 1.6 (ref 1.2–2.2)
Albumin: 4.2 g/dL (ref 3.5–4.8)
Alkaline Phosphatase: 77 IU/L (ref 39–117)
BUN/Creatinine Ratio: 15 (ref 12–28)
BUN: 11 mg/dL (ref 8–27)
Bilirubin Total: 0.5 mg/dL (ref 0.0–1.2)
CO2: 27 mmol/L (ref 18–29)
Calcium: 9.8 mg/dL (ref 8.7–10.3)
Chloride: 96 mmol/L (ref 96–106)
Creatinine, Ser: 0.75 mg/dL (ref 0.57–1.00)
GFR calc Af Amer: 91 mL/min/{1.73_m2} (ref >59)
GFR calc non Af Amer: 79 mL/min/{1.73_m2} (ref >59)
Globulin, Total: 2.7 g/dL (ref 1.5–4.5)
Glucose: 87 mg/dL (ref 65–99)
Potassium: 4 mmol/L (ref 3.5–5.2)
Sodium: 139 mmol/L (ref 134–144)
Total Protein: 6.9 g/dL (ref 6.0–8.5)

## 2016-02-18 ENCOUNTER — Other Ambulatory Visit: Payer: Self-pay | Admitting: Family

## 2016-02-24 DIAGNOSIS — M150 Primary generalized (osteo)arthritis: Secondary | ICD-10-CM | POA: Diagnosis not present

## 2016-03-02 ENCOUNTER — Other Ambulatory Visit: Payer: Self-pay | Admitting: Family

## 2016-03-02 DIAGNOSIS — J309 Allergic rhinitis, unspecified: Secondary | ICD-10-CM

## 2016-03-26 DIAGNOSIS — M150 Primary generalized (osteo)arthritis: Secondary | ICD-10-CM | POA: Diagnosis not present

## 2016-04-09 ENCOUNTER — Ambulatory Visit (INDEPENDENT_AMBULATORY_CARE_PROVIDER_SITE_OTHER): Payer: Medicare Other | Admitting: Family Medicine

## 2016-04-09 ENCOUNTER — Encounter: Payer: Self-pay | Admitting: Family Medicine

## 2016-04-09 VITALS — BP 147/87 | HR 76 | Temp 98.6°F | Ht 59.5 in | Wt 187.2 lb

## 2016-04-09 DIAGNOSIS — J40 Bronchitis, not specified as acute or chronic: Secondary | ICD-10-CM

## 2016-04-09 MED ORDER — DOXYCYCLINE HYCLATE 100 MG PO TABS: 100.0000 mg | ORAL_TABLET | Freq: Two times a day (BID) | ORAL | 0 refills | Status: DC

## 2016-04-09 NOTE — Progress Notes (Signed)
BP (!) 147/87   Pulse 76   Temp 98.6 F (37 C) (Oral)   Ht 4' 11.5" (1.511 m)   Wt 187 lb 3.2 oz (84.9 kg)   BMI 37.18 kg/m    Subjective:    Patient ID: Sheila [name], female    DOB: 10-06-1940, 76 y.o.   MRN: [id number]  HPI: Sheila [name] is a 76 y.o. female presenting on 04/09/2016 for Cough   HPI Cough and congestion and wheezing Patient has been having cough and congestion and wheezing is been going on for the past 1 week. She says that her nephew may have had an illness like a cold that she got from him. She has been trying to use her albuterol inhalers but they do not seem to be helping as much. Her cough has been mostly dry and nonproductive. She is having some wheezing as well but denies any shortness of breath or fevers or chills. The wheezing and coughing has been worse in the evening but she still has a significant amount during the day as well. Besides her albuterol inhaler she is using Flonase and an allergy pill as well.  Relevant past medical, surgical, family and social history reviewed and updated as indicated. Interim medical history since our last visit reviewed. Allergies and medications reviewed and updated.  Review of Systems  Constitutional: Negative for chills and fever.  HENT: Positive for congestion, postnasal drip, rhinorrhea, sinus pressure, sneezing and sore throat. Negative for ear discharge and ear pain.   Eyes: Negative for pain, redness and visual disturbance.  Respiratory: Positive for cough and wheezing. Negative for chest tightness and shortness of breath.   Cardiovascular: Negative for chest pain and leg swelling.  Genitourinary: Negative for difficulty urinating and dysuria.  Musculoskeletal: Negative for back pain and gait problem.  Skin: Negative for rash.  Neurological: Negative for light-headedness and headaches.  Psychiatric/Behavioral: Negative for agitation and behavioral problems.  All other systems reviewed and are negative.   Per  HPI unless specifically indicated above     Objective:    BP (!) 147/87   Pulse 76   Temp 98.6 F (37 C) (Oral)   Ht 4' 11.5" (1.511 m)   Wt 187 lb 3.2 oz (84.9 kg)   BMI 37.18 kg/m   Wt Readings from Last 3 Encounters:  04/09/16 187 lb 3.2 oz (84.9 kg)  02/10/16 179 lb 6.4 oz (81.4 kg)  01/09/16 175 lb 12 oz (79.7 kg)    Physical Exam  Constitutional: She is oriented to person, place, and time. She appears well-developed and well-nourished. No distress.  HENT:  Right Ear: Tympanic membrane, external ear and ear canal normal.  Left Ear: Tympanic membrane, external ear and ear canal normal.  Nose: Mucosal edema and rhinorrhea present. No epistaxis. Right sinus exhibits no maxillary sinus tenderness and no frontal sinus tenderness. Left sinus exhibits no maxillary sinus tenderness and no frontal sinus tenderness.  Mouth/Throat: Uvula is midline and mucous membranes are normal. Posterior oropharyngeal edema and posterior oropharyngeal erythema present. No oropharyngeal exudate or tonsillar abscesses.  Eyes: Conjunctivae are normal.  Cardiovascular: Normal rate, regular rhythm, normal heart sounds and intact distal pulses.   No murmur heard. Pulmonary/Chest: Effort normal. No respiratory distress. She has no wheezes. She has rhonchi. She has no rales.  Musculoskeletal: Normal range of motion. She exhibits no edema or tenderness.  Neurological: She is alert and oriented to person, place, and time. Coordination normal.  Skin: Skin is warm and  dry. No rash noted. She is not diaphoretic.  Psychiatric: She has a normal mood and affect. Her behavior is normal.  Vitals reviewed.     Assessment & Plan:   Problem List Items Addressed This Visit    None    Visit Diagnoses    Bronchitis    -  Primary   Relevant Medications   doxycycline (VIBRA-TABS) 100 MG tablet       Follow up plan: Return if symptoms worsen or fail to improve.  Counseling provided for all of the vaccine  components No orders of the defined types were placed in this encounter.   Caryl Pina, MD [facility] 04/09/2016, 3:22 PM

## 2016-04-15 DIAGNOSIS — I10 Essential (primary) hypertension: Secondary | ICD-10-CM | POA: Diagnosis not present

## 2016-04-15 DIAGNOSIS — T17390A Other foreign object in larynx causing asphyxiation, initial encounter: Secondary | ICD-10-CM | POA: Diagnosis not present

## 2016-04-16 ENCOUNTER — Encounter (HOSPITAL_COMMUNITY): Payer: Self-pay

## 2016-04-16 ENCOUNTER — Emergency Department (HOSPITAL_COMMUNITY)
Admission: EM | Admit: 2016-04-16 | Discharge: 2016-04-16 | Disposition: A | Discharge status: Home / Self Care | Payer: Medicare Other | Attending: Emergency Medicine | Admitting: Emergency Medicine

## 2016-04-16 ENCOUNTER — Emergency Department (HOSPITAL_COMMUNITY): Payer: Medicare Other

## 2016-04-16 DIAGNOSIS — E039 Hypothyroidism, unspecified: Secondary | ICD-10-CM | POA: Diagnosis not present

## 2016-04-16 DIAGNOSIS — K279 Peptic ulcer, site unspecified, unspecified as acute or chronic, without hemorrhage or perforation: Secondary | ICD-10-CM

## 2016-04-16 DIAGNOSIS — R1013 Epigastric pain: Secondary | ICD-10-CM

## 2016-04-16 DIAGNOSIS — I10 Essential (primary) hypertension: Secondary | ICD-10-CM | POA: Insufficient documentation

## 2016-04-16 DIAGNOSIS — Z79899 Other long term (current) drug therapy: Secondary | ICD-10-CM | POA: Diagnosis not present

## 2016-04-16 DIAGNOSIS — Z87891 Personal history of nicotine dependence: Secondary | ICD-10-CM | POA: Insufficient documentation

## 2016-04-16 DIAGNOSIS — R0789 Other chest pain: Secondary | ICD-10-CM | POA: Diagnosis not present

## 2016-04-16 DIAGNOSIS — R079 Chest pain, unspecified: Secondary | ICD-10-CM | POA: Diagnosis present

## 2016-04-16 LAB — CBC WITH DIFFERENTIAL/PLATELET
Basophils Absolute: 0 10*3/uL (ref 0.0–0.1)
Basophils Relative: 0 %
Eosinophils Absolute: 0.1 10*3/uL (ref 0.0–0.7)
Eosinophils Relative: 0 %
HCT: 37.7 % (ref 36.0–46.0)
Hemoglobin: 13.4 g/dL (ref 12.0–15.0)
Lymphocytes Relative: 28 %
Lymphs Abs: 3.7 10*3/uL (ref 0.7–4.0)
MCH: 29.5 pg (ref 26.0–34.0)
MCHC: 35.5 g/dL (ref 30.0–36.0)
MCV: 82.9 fL (ref 78.0–100.0)
Monocytes Absolute: 0.8 10*3/uL (ref 0.1–1.0)
Monocytes Relative: 6 %
Neutro Abs: 8.7 10*3/uL — ABNORMAL HIGH (ref 1.7–7.7)
Neutrophils Relative %: 66 %
Platelets: 310 10*3/uL (ref 150–400)
RBC: 4.55 MIL/uL (ref 3.87–5.11)
RDW: 14.3 % (ref 11.5–15.5)
WBC: 13.2 10*3/uL — ABNORMAL HIGH (ref 4.0–10.5)

## 2016-04-16 LAB — COMPREHENSIVE METABOLIC PANEL
ALT: 16 U/L (ref 14–54)
AST: 22 U/L (ref 15–41)
Albumin: 4.2 g/dL (ref 3.5–5.0)
Alkaline Phosphatase: 58 U/L (ref 38–126)
Anion gap: 11 (ref 5–15)
BUN: 11 mg/dL (ref 6–20)
CO2: 21 mmol/L — ABNORMAL LOW (ref 22–32)
Calcium: 9.6 mg/dL (ref 8.9–10.3)
Chloride: 107 mmol/L (ref 101–111)
Creatinine, Ser: 0.6 mg/dL (ref 0.44–1.00)
GFR calc Af Amer: >60 mL/min (ref >60)
GFR calc non Af Amer: >60 mL/min (ref >60)
Glucose, Bld: 115 mg/dL — ABNORMAL HIGH (ref 65–99)
Potassium: 2.9 mmol/L — ABNORMAL LOW (ref 3.5–5.1)
Sodium: 139 mmol/L (ref 135–145)
Total Bilirubin: 1.3 mg/dL — ABNORMAL HIGH (ref 0.3–1.2)
Total Protein: 7.7 g/dL (ref 6.5–8.1)

## 2016-04-16 LAB — TROPONIN I: Troponin I: <0.03 ng/mL (ref <0.03)

## 2016-04-16 LAB — LIPASE, BLOOD: Lipase: 23 U/L (ref 11–51)

## 2016-04-16 MED ORDER — GI COCKTAIL ~~LOC~~
30.0000 mL | Freq: Once | ORAL | Status: AC
  Administered 2016-04-16: 30 mL via ORAL
  Filled 2016-04-16: qty 30

## 2016-04-16 MED ORDER — PANTOPRAZOLE SODIUM 20 MG PO TBEC: 20.0000 mg | DELAYED_RELEASE_TABLET | Freq: Every day | ORAL | 0 refills | Status: DC

## 2016-04-16 NOTE — ED Provider Notes (Signed)
[facility] DEPT Provider Note   CSN: [id number] Arrival date & time: 04/16/16  1930     History   Chief Complaint Chief Complaint  Patient presents with  . Chest Pain    HPI Sheila [name] is a 76 y.o. female.  Patient is a 76 year old female with past medical history of asthma, hypertension, hypothyroidism. She presents for evaluation of epigastric pain for the past 3 days. She reports that anytime she eats or drinks, she develops pain. This is located primarily in the epigastric region. She denies any fevers or chills. She denies any shortness of breath, cough, diaphoresis, or radiation to the arm or jaw.   The history is provided by the patient.  Chest Pain   This is a new problem. Episode onset: 3 days ago. The problem occurs constantly. The problem has not changed since onset.The pain is associated with eating. The pain is moderate. The quality of the pain is described as burning. The pain does not radiate. Associated symptoms include abdominal pain. Pertinent negatives include no diaphoresis, no fever, no shortness of breath, no sputum production and no vomiting. She has tried nothing for the symptoms.    Past Medical History:  Diagnosis Date  . Bell's palsy   . Cancer ([place])    skin CA on face  . Cataract   . Chest pain 07/2009   Myoveiw stress test negative.  . Depression   . Diverticulosis of colon   . DJD (degenerative joint disease) of knee   . Gastric ulcer with hemorrhage 01/14/2011  . History of fractured pelvis   . Hypercholesteremia   . Hypertension   . Hypothyroidism   . UTI (lower urinary tract infection)     Patient Active Problem List   Diagnosis Date Noted  . Opioid dependence ([place]) 04/29/2015  . Pain medication agreement signed 04/29/2015  . Chronic back pain 04/29/2015  . Osteoarthritis 04/29/2015  . Metabolic syndrome [id number]  . BMI 37.0-37.9, adult 10/18/2014  . Neuropathy ([place]) 10/18/2014  . Hypokalemia 10/18/2014  . Depressed  09/05/2013  . Osteoporosis 06/16/2012  . PUD (peptic ulcer disease) 06/16/2012  . GAD (generalized anxiety disorder) 06/16/2012  . Hyperlipidemia 06/16/2012  . DJD (degenerative joint disease) 06/16/2012  . Hypercalcemia 06/16/2012  . Essential hypertension, benign 06/16/2012  . Gastric ulcer with hemorrhage 01/14/2011  . Diverticulosis of colon 01/13/2011  . Prerenal azotemia 01/13/2011  . Leukocytosis 01/13/2011  . Sacral insufficiency fracture 01/13/2011  . Hypothyroidism 01/13/2011  . Orthostatic lightheadedness 01/13/2011  . CHEST PAIN-PRECORDIAL 07/16/2009  . ABNORMAL EKG 07/16/2009    Past Surgical History:  Procedure Laterality Date  . APPENDECTOMY    . ESOPHAGOGASTRODUODENOSCOPY  01/14/2011   Procedure: ESOPHAGOGASTRODUODENOSCOPY (EGD);  Surgeon: Rogene Houston, MD;  Location: [facility] ENDO SUITE;  Service: Endoscopy;  Laterality: N/A;  . HIP SURGERY     pelvis  . JOINT REPLACEMENT    . KNEE SURGERY  04/2010.   Total left knee replacement  . LAPAROSCOPIC APPENDECTOMY N/A 08/01/2013   Procedure: APPENDECTOMY LAPAROSCOPIC;  Surgeon: Jamesetta So, MD;  Location: [facility] ORS;  Service: General;  Laterality: N/A;    OB History    No data available       Home Medications    Prior to Admission medications   Medication Sig Start Date End Date Taking? Authorizing Provider  acetaminophen (TYLENOL) 650 MG CR tablet Take 1,300 mg by mouth 2 (two) times daily.     Historical Provider, MD  albuterol (PROVENTIL HFA;VENTOLIN HFA) 108 (  90 Base) MCG/ACT inhaler Inhale 2 puffs into the lungs every 6 (six) hours as needed for wheezing. 03/04/15   Sharion Balloon, FNP  albuterol (PROVENTIL) (2.5 MG/3ML) 0.083% nebulizer solution Nebulize 1 vial every 6-8 hours as needed for wheezing or shortness of breath. 04/15/15   Sharion Balloon, FNP  ALPRAZolam (XANAX) 0.5 MG tablet TAKE  (1)  TABLET TWICE A DAY. Patient taking differently: Take 0.5-1 mg by mouth at bedtime as needed. TAKE  (1)  TABLET  TWICE A DAY. 11/07/15   Sharion Balloon, FNP  azelastine (ASTELIN) 0.1 % nasal spray USE 1 TO 2 SPRAYS IN EACH NOSTRIL AT BEDTIME 03/02/16   Sharion Balloon, FNP  citalopram (CELEXA) 20 MG tablet TAKE 1 TABLET DAILY 02/04/16   Sharion Balloon, FNP  clotrimazole-betamethasone (LOTRISONE) cream Apply topically 2 (two) times daily. 01/01/16   Sharion Balloon, FNP  denosumab (PROLIA) 60 MG/ML SOLN injection Inject 60 mg into the skin every 6 (six) months. Bring to office for administration. 01/06/16   Cherre Robins, PharmD  doxycycline (VIBRA-TABS) 100 MG tablet Take 1 tablet (100 mg total) by mouth 2 (two) times daily. 1 po bid 04/09/16   Fransisca Kaufmann Dettinger, MD  estradiol (ESTRACE) 0.1 MG/GM vaginal cream Place 1 Applicatorful vaginally 3 (three) times a week. Reported on 08/07/2015    Historical Provider, MD  fluticasone (FLONASE) 50 MCG/ACT nasal spray USE 1 TO 2 SPRAYS IN EACH NOSTRIL AT BEDTIME 11/19/14   Mary-Margaret Hassell Done, FNP  furosemide (LASIX) 20 MG tablet TAKE 1 TABLET DAILY 02/18/16   Sharion Balloon, FNP  gabapentin (NEURONTIN) 300 MG capsule TAKE (1) CAPSULE THREE TIMES DAILY. 02/04/16   Sharion Balloon, FNP  hydroxypropyl methylcellulose / hypromellose (ISOPTO TEARS / GONIOVISC) 2.5 % ophthalmic solution Place 1 drop into both eyes as needed for dry eyes.    Historical Provider, MD  levothyroxine (SYNTHROID, LEVOTHROID) 75 MCG tablet TAKE 1 TABLET DAILY 09/09/15   Sharion Balloon, FNP  meclizine (ANTIVERT) 25 MG tablet Take 1 tablet 3 times daily as needed for dizziness. 02/18/16   Sharion Balloon, FNP  meloxicam (MOBIC) 15 MG tablet Take 1 tablet (15 mg total) by mouth daily. 01/07/16   Mary-Margaret Hassell Done, FNP  Multiple Vitamins-Minerals (CENTRUM SILVER ADULT 50+ PO) Take 1 tablet by mouth every other day.    Historical Provider, MD  Omega-3 Fatty Acids (FISH OIL PO) Take 2 capsules by mouth 2 (two) times daily.    Historical Provider, MD  omeprazole (PRILOSEC) 40 MG capsule TAKE (1) CAPSULE  DAILY 01/06/16   Sharion Balloon, FNP  potassium chloride SA (K-DUR,KLOR-CON) 20 MEQ tablet TAKE  (1)  TABLET TWICE A DAY. 02/04/16   Sharion Balloon, FNP  SYMBICORT 80-4.5 MCG/ACT inhaler INHALE 2 PUFFS 2 TIMES A DAY 12/24/15   Sharion Balloon, FNP  traMADol (ULTRAM) 50 MG tablet Take 2 tablets (100 mg total) by mouth every 8 (eight) hours as needed for moderate pain or severe pain. 02/10/16   Sharion Balloon, FNP    Family History Family History  Problem Relation Age of Onset  . Aneurysm Mother   . Stroke Mother   . Hypertension Mother   . Cancer Father     lung  . Heart disease Sister   . Cancer Brother   . Alzheimer's disease Sister   . Cancer Brother     Social History Social History  Substance Use Topics  . Smoking status: Former Smoker  Quit date: 02/24/1968  . Smokeless tobacco: Current User    Types: Snuff     Comment: quit yrs and yrs ago when she was very young  . Alcohol use No     Allergies   Benadryl [diphenhydramine hcl]; Codeine; Diphenhydramine hcl; Ibuprofen; Lipitor [atorvastatin]; and Morphine and related   Review of Systems Review of Systems  Constitutional: Negative for diaphoresis and fever.  Respiratory: Negative for sputum production and shortness of breath.   Cardiovascular: Positive for chest pain.  Gastrointestinal: Positive for abdominal pain. Negative for vomiting.  All other systems reviewed and are negative.    Physical Exam Updated Vital Signs BP (S) (!) 179/106 (BP Location: Right Arm)   Pulse 106   Temp 98.5 F (36.9 C) (Oral)   Resp 18   Ht 4\' 11"  (1.499 m)   Wt 181 lb (82.1 kg)   SpO2 (S) 97%   BMI 36.56 kg/m   Physical Exam  Constitutional: She is oriented to person, place, and time. She appears well-developed and well-nourished. No distress.  HENT:  Head: Normocephalic and atraumatic.  Neck: Normal range of motion. Neck supple.  Cardiovascular: Normal rate and regular rhythm.  Exam reveals no gallop and no friction  rub.   No murmur heard. Pulmonary/Chest: Effort normal and breath sounds normal. No respiratory distress. She has no wheezes.  Abdominal: Soft. Bowel sounds are normal. She exhibits no distension. There is tenderness. There is no rebound and no guarding.  There is tenderness to palpation in the epigastric region.  Musculoskeletal: Normal range of motion.  Neurological: She is alert and oriented to person, place, and time.  Skin: Skin is warm and dry. She is not diaphoretic.  Nursing note and vitals reviewed.    ED Treatments / Results  Labs (all labs ordered are listed, but only abnormal results are displayed) Labs Reviewed  COMPREHENSIVE METABOLIC PANEL  LIPASE, BLOOD  TROPONIN I  CBC WITH DIFFERENTIAL/PLATELET    EKG  EKG Interpretation None       Radiology No results found.  Procedures Procedures (including critical care time)  Medications Ordered in ED Medications  gi cocktail (Maalox,Lidocaine,Donnatal) (not administered)     Initial Impression / Assessment and Plan / ED Course  I have reviewed the triage vital signs and the nursing notes.  Pertinent labs & imaging results that were available during my care of the patient were reviewed by me and considered in my medical decision making (see chart for details).  Patient with epigastric pain that appears related to gastritis or esophagitis. She feels better with a GI cocktail and cardiac workup is negative. She will be given Prilosec to be taken twice daily and she is to follow-up with gastroenterology as an outpatient.  Final Clinical Impressions(s) / ED Diagnoses   Final diagnoses:  None    New Prescriptions New Prescriptions   No medications on file     Veryl Speak, MD 04/16/16 [id number]

## 2016-04-16 NOTE — Discharge Instructions (Signed)
Begin taking Protonix as prescribed.  Follow up with Dr. Laural Golden in the Gastroenterology Clinic in the next week. Call tomorrow to make these arrangements. His contact information has been provided in this discharge summary.  Return to the Emergency Department if you develop worsening pain, high fevers, bloody stools, or other new and concerning symptoms.

## 2016-04-16 NOTE — ED Notes (Signed)
EKG shown to Dr. Stark Jock.

## 2016-04-16 NOTE — ED Triage Notes (Signed)
Patient reports of chest pain x3 days. Reports of having a "feeling something is stuck in my chest". Patient reports of pain increases after eating and drinking. Able to keep food and fluids down. Denies shortness of breath. MD at bedside.

## 2016-04-23 DIAGNOSIS — M150 Primary generalized (osteo)arthritis: Secondary | ICD-10-CM | POA: Diagnosis not present

## 2016-04-30 DIAGNOSIS — M4316 Spondylolisthesis, lumbar region: Secondary | ICD-10-CM | POA: Diagnosis not present

## 2016-04-30 DIAGNOSIS — M5416 Radiculopathy, lumbar region: Secondary | ICD-10-CM | POA: Diagnosis not present

## 2016-05-02 ENCOUNTER — Other Ambulatory Visit: Payer: Self-pay | Admitting: Nurse Practitioner

## 2016-05-02 ENCOUNTER — Other Ambulatory Visit: Payer: Self-pay | Admitting: Family

## 2016-05-02 DIAGNOSIS — M79601 Pain in right arm: Secondary | ICD-10-CM

## 2016-05-02 DIAGNOSIS — K279 Peptic ulcer, site unspecified, unspecified as acute or chronic, without hemorrhage or perforation: Secondary | ICD-10-CM

## 2016-05-04 NOTE — Telephone Encounter (Signed)
Please forward to [facility], she will be back tomorrow

## 2016-05-05 NOTE — Telephone Encounter (Signed)
Alprazolam refill called to [facility]

## 2016-05-11 ENCOUNTER — Ambulatory Visit (INDEPENDENT_AMBULATORY_CARE_PROVIDER_SITE_OTHER): Payer: Medicare Other | Admitting: Family

## 2016-05-11 ENCOUNTER — Encounter: Payer: Self-pay | Admitting: Family

## 2016-05-11 VITALS — BP 128/85 | HR 96 | Temp 98.5°F | Ht 59.5 in | Wt 182.6 lb

## 2016-05-11 DIAGNOSIS — M545 Low back pain: Secondary | ICD-10-CM

## 2016-05-11 DIAGNOSIS — E8881 Metabolic syndrome: Secondary | ICD-10-CM | POA: Diagnosis not present

## 2016-05-11 DIAGNOSIS — F1120 Opioid dependence, uncomplicated: Secondary | ICD-10-CM

## 2016-05-11 DIAGNOSIS — F331 Major depressive disorder, recurrent, moderate: Secondary | ICD-10-CM | POA: Diagnosis not present

## 2016-05-11 DIAGNOSIS — M8949 Other hypertrophic osteoarthropathy, multiple sites: Secondary | ICD-10-CM

## 2016-05-11 DIAGNOSIS — E876 Hypokalemia: Secondary | ICD-10-CM

## 2016-05-11 DIAGNOSIS — E039 Hypothyroidism, unspecified: Secondary | ICD-10-CM

## 2016-05-11 DIAGNOSIS — M159 Polyosteoarthritis, unspecified: Secondary | ICD-10-CM

## 2016-05-11 DIAGNOSIS — M810 Age-related osteoporosis without current pathological fracture: Secondary | ICD-10-CM

## 2016-05-11 DIAGNOSIS — K279 Peptic ulcer, site unspecified, unspecified as acute or chronic, without hemorrhage or perforation: Secondary | ICD-10-CM

## 2016-05-11 DIAGNOSIS — G8929 Other chronic pain: Secondary | ICD-10-CM

## 2016-05-11 DIAGNOSIS — Z0289 Encounter for other administrative examinations: Secondary | ICD-10-CM

## 2016-05-11 DIAGNOSIS — M1990 Unspecified osteoarthritis, unspecified site: Secondary | ICD-10-CM

## 2016-05-11 DIAGNOSIS — I10 Essential (primary) hypertension: Secondary | ICD-10-CM | POA: Diagnosis not present

## 2016-05-11 DIAGNOSIS — M150 Primary generalized (osteo)arthritis: Secondary | ICD-10-CM

## 2016-05-11 DIAGNOSIS — G629 Polyneuropathy, unspecified: Secondary | ICD-10-CM | POA: Diagnosis not present

## 2016-05-11 DIAGNOSIS — Z6837 Body mass index (BMI) 37.0-37.9, adult: Secondary | ICD-10-CM

## 2016-05-11 MED ORDER — TRAMADOL HCL 50 MG PO TABS
100.0000 mg | ORAL_TABLET | Freq: Three times a day (TID) | ORAL | 2 refills | Status: DC | PRN
Start: 2016-05-11 — End: 2016-06-25

## 2016-05-11 NOTE — Patient Instructions (Signed)
Chronic Back Pain When back pain lasts longer than 3 months, it is called chronic back pain.The cause of your back pain may not be known. Some common causes include:  Wear and tear (degenerative disease) of the bones, ligaments, or disks in your back.  Inflammation and stiffness in your back (arthritis). People who have chronic back pain often go through certain periods in which the pain is more intense (flare-ups). Many people can learn to manage the pain with home care. Follow these instructions at home: Pay attention to any changes in your symptoms. Take these actions to help with your pain: Activity   Avoid bending and activities that make the problem worse.  Do not sit or stand in one place for long periods of time.  Take brief periods of rest throughout the day. This will reduce your pain. Resting in a lying or standing position is usually better than sitting to rest.  When you are resting for longer periods, mix in some mild activity or stretching between periods of rest. This will help to prevent stiffness and pain.  Get regular exercise. Ask your health care provider what activities are safe for you.  Do not lift anything that is heavier than 10 lb (4.5 kg). Always use proper lifting technique, which includes:  Bending your knees.  Keeping the load close to your body.  Avoiding twisting. Managing pain   If directed, apply ice to the painful area. Your health care provider may recommend applying ice during the first 24-48 hours after a flare-up begins.  Put ice in a plastic bag.  Place a towel between your skin and the bag.  Leave the ice on for 20 minutes, 2-3 times per day.  After icing, apply heat to the affected area as often as told by your health care provider. Use the heat source that your health care provider recommends, such as a moist heat pack or a heating pad.  Place a towel between your skin and the heat source.  Leave the heat on for 20-30  minutes.  Remove the heat if your skin turns bright red. This is especially important if you are unable to feel pain, heat, or cold. You may have a greater risk of getting burned.  Try soaking in a warm tub.  Take over-the-counter and prescription medicines only as told by your health care provider.  Keep all follow-up visits as told by your health care provider. This is important. Contact a health care provider if:  You have pain that is not relieved with rest or medicine. Get help right away if:  You have weakness or numbness in one or both of your legs or feet.  You have trouble controlling your bladder or your bowels.  You have nausea or vomiting.  You have pain in your abdomen.  You have shortness of breath or you faint. This information is not intended to replace advice given to you by your health care provider. Make sure you discuss any questions you have with your health care provider. Document Released: 03/19/2004 Document Revised: 06/20/2015 Document Reviewed: 07/30/2014 Elsevier Interactive Patient Education  2017 [employer] Inc.  

## 2016-05-11 NOTE — Progress Notes (Signed)
Subjective:    Patient ID: Sheila [name], female    DOB: 07/09/1940, 76 y.o.   MRN: [id number]   Patient here today for follow up of chronic medical problems.  Hypertension  This is a chronic problem. The current episode started more than 1 year ago. The problem has been resolved since onset. The problem is controlled. Associated symptoms include anxiety. Pertinent negatives include no chest pain, headaches, palpitations, peripheral edema or shortness of breath. Risk factors for coronary artery disease include dyslipidemia, post-menopausal state, obesity and sedentary lifestyle. Past treatments include diuretics. The current treatment provides moderate improvement. Compliance problems include diet and exercise.  Hypertensive end-organ damage includes CAD/MI. There is no history of CVA or PVD. Identifiable causes of hypertension include a thyroid problem.  Hyperlipidemia  This is a chronic problem. The current episode started more than 1 year ago. The problem is controlled. Recent lipid tests were reviewed and are normal. Exacerbating diseases include obesity. Associated symptoms include leg pain. Pertinent negatives include no chest pain, myalgias or shortness of breath. Current antihyperlipidemic treatment includes statins. The current treatment provides moderate improvement of lipids. Compliance problems include adherence to diet and adherence to exercise.  Risk factors for coronary artery disease include dyslipidemia, hypertension and post-menopausal.  Gastroesophageal Reflux  She complains of a hoarse voice. She reports no chest pain, no choking or no heartburn. This is a chronic problem. The problem occurs occasionally. Nothing aggravates the symptoms. Pertinent negatives include no fatigue, melena or orthopnea. There are no known risk factors. She has tried a PPI for the symptoms. The treatment provided moderate relief.  Thyroid Problem  Presents for follow-up (hypothyroidism) visit. Symptoms  include anxiety, depressed mood and hoarse voice. Patient reports no diarrhea, dry skin, fatigue, palpitations or visual change. The symptoms have been stable. Her past medical history is significant for hyperlipidemia.  Anxiety  Presents for follow-up visit. Onset was more than 5 years ago. Symptoms include depressed mood, excessive worry, nervous/anxious behavior and restlessness. Patient reports no chest pain, irritability, palpitations or shortness of breath. Symptoms occur occasionally.   Her past medical history is significant for anxiety/panic attacks and depression. Past treatments include SSRIs.  Depression       The patient presents with depression.  This is a chronic problem.  The current episode started more than 1 year ago.   The onset quality is gradual.   The problem occurs intermittently.  The problem has been waxing and waning since onset.  Associated symptoms include restlessness.  Associated symptoms include no fatigue, no helplessness, no hopelessness, not irritable, no myalgias, no headaches and not sad.  Past treatments include SSRIs - Selective serotonin reuptake inhibitors.  Compliance with treatment is good.  Past medical history includes thyroid problem, anxiety and depression.   Arthritis  Presents for follow-up visit. She complains of pain. Affected locations include the right knee, left knee and right hip. Her pain is at a severity of 8/10. Pertinent negatives include no diarrhea or fatigue.  Back Pain  This is a chronic problem. The current episode started more than 1 year ago. The problem occurs constantly. The problem is unchanged. The pain is present in the lumbar spine. The pain is at a severity of 8/10. The patient is experiencing no pain. The symptoms are aggravated by standing and sitting. Associated symptoms include leg pain. Pertinent negatives include no chest pain or headaches. She has tried analgesics and bed rest for the symptoms. The treatment provided moderate  relief.  Hypokalemia  K+ supplement- no c/o muscle cramps Neuropathy Pt taking gabapentin. States it helps  with  pain in bil legs. Metabolic Syndrome Pt currently not taking anything for cholesterol at this time related to leg pain. Pt denies any exercises related to arthritis and back pain.  Osteoporosis Pt currently taking Prolia every 6 months. Lase Dexascan 06/30/15.  Pain assessment: Pain location- Low back and Osteoarthritis   Pain on scale of 1-10- 8 Frequency-Intermittent What increases pain-Standing and bending What makes pain Better-Pain medication, injections in her back by ortho  Current medications- Ultram 100 mg every 8 hours prn, #120 Effectiveness of current meds-Stable  Pill count performed-No Urine drug screen- No, 04/29/15 Was the [place] reviewed- Yes  If yes were their any concerning findings? - None, pt has only received controlled medication from [facility] office.    Review of Systems  Constitutional: Negative.  Negative for fatigue and irritability.  HENT: Positive for hoarse voice.   Eyes: Negative.   Respiratory: Negative.  Negative for choking and shortness of breath.   Cardiovascular: Negative.  Negative for chest pain and palpitations.  Gastrointestinal: Negative for diarrhea, heartburn and melena.  Endocrine: Negative.   Genitourinary: Negative.   Musculoskeletal: Positive for arthritis and back pain. Negative for myalgias.  Neurological: Negative.  Negative for headaches.  Hematological: Negative.   Psychiatric/Behavioral: Positive for depression. The patient is nervous/anxious.   All other systems reviewed and are negative.      Objective:   Physical Exam  Constitutional: She is oriented to person, place, and time. She appears well-developed and well-nourished. She is not irritable. No distress.  HENT:  Head: Normocephalic and atraumatic.  Right Ear: External ear normal.  Mouth/Throat: Oropharynx is clear and moist.  Eyes: Pupils are equal,  round, and reactive to light.  Neck: Normal range of motion. Neck supple. No thyromegaly present.  Cardiovascular: Normal rate, regular rhythm, normal heart sounds and intact distal pulses.   No murmur heard. Pulmonary/Chest: Effort normal and breath sounds normal. No respiratory distress. She has no wheezes.  Abdominal: Soft. Bowel sounds are normal. She exhibits no distension. There is no tenderness.  Genitourinary:  Genitourinary Comments:    Musculoskeletal: Normal range of motion. She exhibits no edema or tenderness.  Pain in lower back with flexion or rotation, using walker   Neurological: She is alert and oriented to person, place, and time.  Skin: Skin is warm and dry.  Psychiatric: She has a normal mood and affect. Her behavior is normal. Judgment and thought content normal.  Vitals reviewed.     BP 128/85   Pulse 96   Temp 98.5 F (36.9 C) (Oral)   Ht 4' 11.5" (1.511 m)   Wt 182 lb 9.6 oz (82.8 kg)   BMI 36.26 kg/m      Assessment & Plan:  1. Essential hypertension, benign - CMP14+EGFR  2. PUD (peptic ulcer disease) - CMP14+EGFR  3. Hypothyroidism, unspecified type - CMP14+EGFR - Thyroid Panel With TSH  4. Neuropathy (HCC) - CMP14+EGFR  5. Osteoporosis, unspecified osteoporosis type, unspecified pathological fracture presence  - CMP14+EGFR  6. Primary osteoarthritis involving multiple joints - CMP14+EGFR - traMADol (ULTRAM) 50 MG tablet; Take 2 tablets (100 mg total) by mouth every 8 (eight) hours as needed for moderate pain or severe pain.  Dispense: 120 tablet; Refill: 2  7. BMI 37.0-37.9, adult - CMP14+EGFR  8. Chronic bilateral low back pain, with sciatica presence unspecified  - CMP14+EGFR - traMADol (ULTRAM) 50 MG tablet; Take 2 tablets (100  mg total) by mouth every 8 (eight) hours as needed for moderate pain or severe pain.  Dispense: 120 tablet; Refill: 2  9. Moderate episode of recurrent major depressive disorder (HCC) -  CMP14+EGFR  10. Hypercalcemia - CMP14+EGFR  11. Metabolic syndrome  - DAP70+KFWB - Lipid panel  12. Uncomplicated opioid dependence (HCC) - CMP14+EGFR - traMADol (ULTRAM) 50 MG tablet; Take 2 tablets (100 mg total) by mouth every 8 (eight) hours as needed for moderate pain or severe pain.  Dispense: 120 tablet; Refill: 2  13. Pain medication agreement signed - CMP14+EGFR - traMADol (ULTRAM) 50 MG tablet; Take 2 tablets (100 mg total) by mouth every 8 (eight) hours as needed for moderate pain or severe pain.  Dispense: 120 tablet; Refill: 2  14. Osteoarthritis, unspecified osteoarthritis type, unspecified site  - CMP14+EGFR  15. Hypokalemia - CMP14+EGFR   Continue all meds Labs pending Health Maintenance reviewed Diet and exercise encouraged RTO 3 months   [name], FNP

## 2016-05-12 ENCOUNTER — Other Ambulatory Visit: Payer: Self-pay | Admitting: Family

## 2016-05-12 DIAGNOSIS — B351 Tinea unguium: Secondary | ICD-10-CM | POA: Diagnosis not present

## 2016-05-12 DIAGNOSIS — M79676 Pain in unspecified toe(s): Secondary | ICD-10-CM | POA: Diagnosis not present

## 2016-05-12 DIAGNOSIS — E785 Hyperlipidemia, unspecified: Secondary | ICD-10-CM

## 2016-05-12 LAB — THYROID PANEL WITH TSH
Free Thyroxine Index: 2.1 (ref 1.2–4.9)
T3 Uptake Ratio: 25 % (ref 24–39)
T4, Total: 8.4 ug/dL (ref 4.5–12.0)
TSH: 1.33 u[IU]/mL (ref 0.450–4.500)

## 2016-05-12 LAB — LIPID PANEL
Chol/HDL Ratio: 5 ratio units — ABNORMAL HIGH (ref 0.0–4.4)
Cholesterol, Total: 284 mg/dL — ABNORMAL HIGH (ref 100–199)
HDL: 57 mg/dL (ref >39)
LDL Calculated: 193 mg/dL — ABNORMAL HIGH (ref 0–99)
Triglycerides: 172 mg/dL — ABNORMAL HIGH (ref 0–149)
VLDL Cholesterol Cal: 34 mg/dL (ref 5–40)

## 2016-05-12 LAB — CMP14+EGFR
ALT: 18 IU/L (ref 0–32)
AST: 19 IU/L (ref 0–40)
Albumin/Globulin Ratio: 1.7 (ref 1.2–2.2)
Albumin: 4.7 g/dL (ref 3.5–4.8)
Alkaline Phosphatase: 66 IU/L (ref 39–117)
BUN/Creatinine Ratio: 32 — ABNORMAL HIGH (ref 12–28)
BUN: 22 mg/dL (ref 8–27)
Bilirubin Total: 0.7 mg/dL (ref 0.0–1.2)
CO2: 25 mmol/L (ref 18–29)
Calcium: 11.3 mg/dL — ABNORMAL HIGH (ref 8.7–10.3)
Chloride: 94 mmol/L — ABNORMAL LOW (ref 96–106)
Creatinine, Ser: 0.68 mg/dL (ref 0.57–1.00)
GFR calc Af Amer: 100 mL/min/{1.73_m2} (ref >59)
GFR calc non Af Amer: 86 mL/min/{1.73_m2} (ref >59)
Globulin, Total: 2.7 g/dL (ref 1.5–4.5)
Glucose: 98 mg/dL (ref 65–99)
Potassium: 4.5 mmol/L (ref 3.5–5.2)
Sodium: 138 mmol/L (ref 134–144)
Total Protein: 7.4 g/dL (ref 6.0–8.5)

## 2016-05-15 ENCOUNTER — Other Ambulatory Visit (INDEPENDENT_AMBULATORY_CARE_PROVIDER_SITE_OTHER): Payer: Medicare Other

## 2016-05-15 DIAGNOSIS — E785 Hyperlipidemia, unspecified: Secondary | ICD-10-CM

## 2016-05-15 LAB — LIPID PANEL
Chol/HDL Ratio: 5.4 ratio units — ABNORMAL HIGH (ref 0.0–4.4)
Cholesterol, Total: 263 mg/dL — ABNORMAL HIGH (ref 100–199)
HDL: 49 mg/dL (ref >39)
LDL Calculated: 181 mg/dL — ABNORMAL HIGH (ref 0–99)
Triglycerides: 166 mg/dL — ABNORMAL HIGH (ref 0–149)
VLDL Cholesterol Cal: 33 mg/dL (ref 5–40)

## 2016-05-18 ENCOUNTER — Other Ambulatory Visit: Payer: Self-pay | Admitting: Family

## 2016-05-18 MED ORDER — SIMVASTATIN 20 MG PO TABS: 20.0000 mg | ORAL_TABLET | Freq: Every day | ORAL | 1 refills | Status: DC

## 2016-05-24 DIAGNOSIS — M150 Primary generalized (osteo)arthritis: Secondary | ICD-10-CM | POA: Diagnosis not present

## 2016-05-27 ENCOUNTER — Ambulatory Visit (INDEPENDENT_AMBULATORY_CARE_PROVIDER_SITE_OTHER): Payer: Self-pay | Admitting: Internal Medicine

## 2016-05-27 DIAGNOSIS — H04123 Dry eye syndrome of bilateral lacrimal glands: Secondary | ICD-10-CM | POA: Diagnosis not present

## 2016-05-27 DIAGNOSIS — H43813 Vitreous degeneration, bilateral: Secondary | ICD-10-CM | POA: Diagnosis not present

## 2016-05-27 DIAGNOSIS — H2513 Age-related nuclear cataract, bilateral: Secondary | ICD-10-CM | POA: Diagnosis not present

## 2016-06-01 ENCOUNTER — Other Ambulatory Visit: Payer: Self-pay | Admitting: Family

## 2016-06-02 ENCOUNTER — Encounter (INDEPENDENT_AMBULATORY_CARE_PROVIDER_SITE_OTHER): Payer: Self-pay | Admitting: *Deleted

## 2016-06-02 ENCOUNTER — Ambulatory Visit (INDEPENDENT_AMBULATORY_CARE_PROVIDER_SITE_OTHER): Payer: Medicare Other | Admitting: Internal Medicine

## 2016-06-02 ENCOUNTER — Encounter (INDEPENDENT_AMBULATORY_CARE_PROVIDER_SITE_OTHER): Payer: Self-pay | Admitting: Internal Medicine

## 2016-06-02 VITALS — BP 142/86 | HR 72 | Temp 97.8°F | Ht 59.0 in | Wt 184.0 lb

## 2016-06-02 DIAGNOSIS — K219 Gastro-esophageal reflux disease without esophagitis: Secondary | ICD-10-CM | POA: Diagnosis not present

## 2016-06-02 DIAGNOSIS — R1313 Dysphagia, pharyngeal phase: Secondary | ICD-10-CM

## 2016-06-02 MED ORDER — PANTOPRAZOLE SODIUM 20 MG PO TBEC: 20.0000 mg | DELAYED_RELEASE_TABLET | Freq: Every day | ORAL | 4 refills | Status: DC

## 2016-06-02 NOTE — Progress Notes (Signed)
Subjective:    Patient ID: Sheila [name], female    DOB: November 29, 1940, 76 y.o.   MRN: [id number]  HPIReferred by [name] FNP for epigastric pain.  Recently seen in the ED 04/16/2016 with epigastric pain x 3 days.  Anything she ate or drank causes epigastric pain.,. No fever or chills. Cardiac work up was negative.  Given GI cocktail in the ED and she felt better.  She was given an Rx for Protonix which she has now finished. Her symptoms were better taking the Protonix.  She states she was having dysphagia during that time and with the Protonix her symptoms improved. Her appetite is okay.  She denies any acid reflux at this time.  Takes Meloxicam daily.  She feels 100% better.   Hx of ulcer in 2012 on EGD. Had been on chronic NSAID therapy.     01/14/2011 EGD: Chronic NSAID therapy with melena and anemia.  Impression: 5 mm antral ulcer with clean base. 12 mm deep antral ulcer with 2 tiny pigment spots no active bleeding noted. Suspect patient bled from this ulcer. Endoscopically it appears to be benign.     04/16/2016 Troponin less than 0.03.  CBC    Component Value Date/Time   WBC 13.2 (H) 04/16/2016 1957   RBC 4.55 04/16/2016 1957   HGB 13.4 04/16/2016 1957   HCT 37.7 04/16/2016 1957   HCT 38.9 08/19/2015 1451   PLT 310 04/16/2016 1957   PLT 326 08/19/2015 1451   MCV 82.9 04/16/2016 1957   MCV 85 08/19/2015 1451   MCH 29.5 04/16/2016 1957   MCHC 35.5 04/16/2016 1957   RDW 14.3 04/16/2016 1957   RDW 14.5 08/19/2015 1451   LYMPHSABS 3.7 04/16/2016 1957   LYMPHSABS 3.9 (H) 08/19/2015 1451   MONOABS 0.8 04/16/2016 1957   EOSABS 0.1 04/16/2016 1957   EOSABS 0.2 08/19/2015 1451   BASOSABS 0.0 04/16/2016 1957   BASOSABS 0.1 08/19/2015 1451   Hepatic Function Panel     Component Value Date/Time   PROT 7.4 05/11/2016 1445   ALBUMIN 4.7 05/11/2016 1445   AST 19 05/11/2016 1445   ALT 18 05/11/2016 1445   ALKPHOS 66 05/11/2016 1445   BILITOT 0.7 05/11/2016 1445   BILIDIR 0.2 08/14/2015 2039   IBILI 0.7 08/14/2015 2039     Review of Systems Past Medical History:  Diagnosis Date  . Bell's palsy   . Cancer ([place])    skin CA on face  . Cataract   . Chest pain 07/2009   Myoveiw stress test negative.  . Depression   . Diverticulosis of colon   . DJD (degenerative joint disease) of knee   . Gastric ulcer with hemorrhage 01/14/2011  . History of fractured pelvis   . Hypercholesteremia   . Hypertension   . Hypothyroidism   . UTI (lower urinary tract infection)     Past Surgical History:  Procedure Laterality Date  . APPENDECTOMY    . ESOPHAGOGASTRODUODENOSCOPY  01/14/2011   Procedure: ESOPHAGOGASTRODUODENOSCOPY (EGD);  Surgeon: Rogene Houston, MD;  Location: [facility] ENDO SUITE;  Service: Endoscopy;  Laterality: N/A;  . HIP SURGERY     pelvis  . JOINT REPLACEMENT    . KNEE SURGERY  04/2010.   Total left knee replacement  . LAPAROSCOPIC APPENDECTOMY N/A 08/01/2013   Procedure: APPENDECTOMY LAPAROSCOPIC;  Surgeon: Jamesetta So, MD;  Location: [facility] ORS;  Service: General;  Laterality: N/A;    Allergies  Allergen Reactions  . Benadryl [Diphenhydramine Hcl] Other (  See Comments)    Worsening depression   . Codeine Nausea And Vomiting  . Diphenhydramine Hcl Other (See Comments)    Worsening depression  . Ibuprofen     History of bleeding ulcers - contra-indicated   . Lipitor [Atorvastatin]     Body aches.  . Morphine And Related Nausea And Vomiting    Current Outpatient Prescriptions on File Prior to Visit  Medication Sig Dispense Refill  . albuterol (PROVENTIL HFA;VENTOLIN HFA) 108 (90 Base) MCG/ACT inhaler Inhale 2 puffs into the lungs every 6 (six) hours as needed for wheezing. 1 Inhaler 2  . albuterol (PROVENTIL) (2.5 MG/3ML) 0.083% nebulizer solution Nebulize 1 vial every 6-8 hours as needed for wheezing or shortness of breath. (Patient taking differently: Take 2.5 mg by nebulization every 6 (six) hours as needed for wheezing or shortness  of breath. Nebulize 1 vial every 6-8 hours as needed for wheezing or shortness of breath.) 180 mL 2  . ALPRAZolam (XANAX) 0.5 MG tablet TAKE  (1)  TABLET TWICE A DAY. 60 tablet 5  . azelastine (ASTELIN) 0.1 % nasal spray USE 1 TO 2 SPRAYS IN EACH NOSTRIL AT BEDTIME 30 mL 4  . citalopram (CELEXA) 20 MG tablet TAKE 1 TABLET DAILY 90 tablet 0  . clotrimazole-betamethasone (LOTRISONE) cream Apply topically 2 (two) times daily. 45 g 2  . denosumab (PROLIA) 60 MG/ML SOLN injection Inject 60 mg into the skin every 6 (six) months. Bring to office for administration. 1 mL 0  . estradiol (ESTRACE) 0.1 MG/GM vaginal cream Place 1 Applicatorful vaginally every Monday, Wednesday, and Friday. Reported on 08/07/2015    . fluticasone (FLONASE) 50 MCG/ACT nasal spray USE 1 TO 2 SPRAYS IN EACH NOSTRIL AT BEDTIME 16 g 4  . gabapentin (NEURONTIN) 300 MG capsule TAKE (1) CAPSULE THREE TIMES DAILY. 90 capsule 0  . hydroxypropyl methylcellulose / hypromellose (ISOPTO TEARS / GONIOVISC) 2.5 % ophthalmic solution Place 1 drop into both eyes as needed for dry eyes.    [name] levothyroxine (SYNTHROID, LEVOTHROID) 75 MCG tablet TAKE 1 TABLET DAILY 30 tablet 11  . meloxicam (MOBIC) 15 MG tablet Take 1 tablet (15 mg total) by mouth daily. 30 tablet 1  . Multiple Vitamins-Minerals (CENTRUM SILVER ADULT 50+ PO) Take 1 tablet by mouth every other day.    . Omega-3 Fatty Acids (FISH OIL PO) Take 2 capsules by mouth 2 (two) times daily.    . pantoprazole (PROTONIX) 20 MG tablet Take 1 tablet (20 mg total) by mouth daily. 30 tablet 0  . potassium chloride SA (K-DUR,KLOR-CON) 20 MEQ tablet TAKE  (1)  TABLET TWICE A DAY. 60 tablet 4  . simvastatin (ZOCOR) 20 MG tablet Take 1 tablet (20 mg total) by mouth daily. 90 tablet 1  . SYMBICORT 80-4.5 MCG/ACT inhaler INHALE 2 PUFFS 2 TIMES A DAY 10.2 g 4  . traMADol (ULTRAM) 50 MG tablet Take 2 tablets (100 mg total) by mouth every 8 (eight) hours as needed for moderate pain or severe pain. 120  tablet 2  . furosemide (LASIX) 20 MG tablet TAKE 1 TABLET DAILY 90 tablet 1   No current facility-administered medications on file prior to visit.        Objective:   Physical Exam Blood pressure (!) 142/86, pulse 72, temperature 97.8 F (36.6 C), height 4\' 11"  (1.499 m), weight 184 lb (83.5 kg). Alert and oriented. Skin warm and dry. Oral mucosa is moist.   . Sclera anicteric, conjunctivae is pink. Thyroid not enlarged. No  cervical lymphadenopathy. Lungs clear. Heart regular rate and rhythm.  Abdomen is soft. Bowel sounds are positive. No hepatomegaly. No abdominal masses felt. No tenderness.  No edema to lower extremities.          Assessment & Plan:  GERD. Am going to start her back on Protonix daily. She will have OV in 3 months. She will bring all her medications. Dysphagia: DG esophagram.  Will decide then if she needs an EGD at next OV

## 2016-06-02 NOTE — Patient Instructions (Signed)
Rx for Protonix 20mg . Bring all medications with you at next OV.

## 2016-06-04 ENCOUNTER — Other Ambulatory Visit: Payer: Self-pay | Admitting: Family

## 2016-06-04 ENCOUNTER — Telehealth: Payer: Self-pay | Admitting: Family

## 2016-06-04 DIAGNOSIS — J209 Acute bronchitis, unspecified: Secondary | ICD-10-CM

## 2016-06-04 NOTE — Telephone Encounter (Signed)
Pt advised she would need to be seen prior to any type of pain medication being given. Pt voiced understanding and states she had an appt soon with [place].

## 2016-06-08 ENCOUNTER — Telehealth: Payer: Self-pay | Admitting: Family

## 2016-06-08 MED ORDER — PITAVASTATIN CALCIUM 2 MG PO TABS: 2.0000 mg | ORAL_TABLET | Freq: Every day | ORAL | 1 refills | Status: DC

## 2016-06-08 NOTE — Telephone Encounter (Signed)
Patient aware of medication change. 

## 2016-06-08 NOTE — Telephone Encounter (Signed)
Zocor changed to Livalo. Prescription sent to pharmacy  2

## 2016-06-10 ENCOUNTER — Ambulatory Visit (HOSPITAL_COMMUNITY)
Admission: RE | Admit: 2016-06-10 | Discharge: 2016-06-10 | Disposition: A | Discharge status: Home / Self Care | Payer: Medicare Other | Source: Ambulatory Visit | Attending: Internal Medicine | Admitting: Internal Medicine

## 2016-06-10 DIAGNOSIS — R1313 Dysphagia, pharyngeal phase: Secondary | ICD-10-CM

## 2016-06-10 DIAGNOSIS — K224 Dyskinesia of esophagus: Secondary | ICD-10-CM | POA: Insufficient documentation

## 2016-06-10 DIAGNOSIS — K449 Diaphragmatic hernia without obstruction or gangrene: Secondary | ICD-10-CM | POA: Diagnosis not present

## 2016-06-12 ENCOUNTER — Other Ambulatory Visit: Payer: Self-pay | Admitting: Pharmacist

## 2016-06-12 MED ORDER — DENOSUMAB 60 MG/ML ~~LOC~~ SOLN: 60.0000 mg | SUBCUTANEOUS | 0 refills | Status: DC

## 2016-06-16 DIAGNOSIS — M5416 Radiculopathy, lumbar region: Secondary | ICD-10-CM | POA: Diagnosis not present

## 2016-06-16 DIAGNOSIS — M4316 Spondylolisthesis, lumbar region: Secondary | ICD-10-CM | POA: Diagnosis not present

## 2016-06-17 ENCOUNTER — Other Ambulatory Visit: Payer: Self-pay | Admitting: Anesthesiology

## 2016-06-17 DIAGNOSIS — M4316 Spondylolisthesis, lumbar region: Secondary | ICD-10-CM

## 2016-06-23 DIAGNOSIS — M150 Primary generalized (osteo)arthritis: Secondary | ICD-10-CM | POA: Diagnosis not present

## 2016-06-25 ENCOUNTER — Ambulatory Visit
Admission: RE | Admit: 2016-06-25 | Discharge: 2016-06-25 | Disposition: A | Discharge status: Home / Self Care | Payer: Medicare Other | Source: Ambulatory Visit | Attending: Anesthesiology | Admitting: Anesthesiology

## 2016-06-25 VITALS — BP 174/94 | HR 79

## 2016-06-25 DIAGNOSIS — M4316 Spondylolisthesis, lumbar region: Secondary | ICD-10-CM

## 2016-06-25 DIAGNOSIS — M545 Low back pain: Principal | ICD-10-CM

## 2016-06-25 DIAGNOSIS — G8929 Other chronic pain: Secondary | ICD-10-CM

## 2016-06-25 DIAGNOSIS — M48061 Spinal stenosis, lumbar region without neurogenic claudication: Secondary | ICD-10-CM | POA: Diagnosis not present

## 2016-06-25 MED ORDER — DIAZEPAM 5 MG PO TABS
5.0000 mg | ORAL_TABLET | Freq: Once | ORAL | Status: AC
  Administered 2016-06-25: 5 mg via ORAL

## 2016-06-25 MED ORDER — MEPERIDINE HCL 100 MG/ML IJ SOLN
75.0000 mg | Freq: Once | INTRAMUSCULAR | Status: AC
  Administered 2016-06-25: 75 mg via INTRAMUSCULAR

## 2016-06-25 MED ORDER — ONDANSETRON HCL 4 MG/2ML IJ SOLN
4.0000 mg | Freq: Once | INTRAMUSCULAR | Status: AC
  Administered 2016-06-25: 4 mg via INTRAMUSCULAR

## 2016-06-25 MED ORDER — IOPAMIDOL (ISOVUE-M 200) INJECTION 41%
15.0000 mL | Freq: Once | INTRAMUSCULAR | Status: AC
  Administered 2016-06-25: 15 mL via INTRATHECAL

## 2016-06-25 NOTE — Progress Notes (Signed)
Patient states she has been off Celexa for at least the past two days.  Prestin Munch, RN 

## 2016-06-25 NOTE — Discharge Instructions (Signed)
Myelogram Discharge Instructions  1. Go home and rest quietly for the next 24 hours.  It is important to lie flat for the next 24 hours.  Get up only to go to the restroom.  You may lie in the bed or on a couch on your back, your stomach, your left side or your right side.  You may have one pillow under your head.  You may have pillows between your knees while you are on your side or under your knees while you are on your back.  2. DO NOT drive today.  Recline the seat as far back as it will go, while still wearing your seat belt, on the way home.  3. You may get up to go to the bathroom as needed.  You may sit up for 10 minutes to eat.  You may resume your normal diet and medications unless otherwise indicated.  Drink lots of extra fluids today and tomorrow.  4. The incidence of headache, nausea, or vomiting is about 5% (one in 20 patients).  If you develop a headache, lie flat and drink plenty of fluids until the headache goes away.  Caffeinated beverages may be helpful.  If you develop severe nausea and vomiting or a headache that does not go away with flat bed rest, call [phone].  5. You may resume normal activities after your 24 hours of bed rest is over; however, do not exert yourself strongly or do any heavy lifting tomorrow. If when you get up you have a headache when standing, go back to bed and force fluids for another 24 hours.  6. Call your physician for a follow-up appointment.  The results of your myelogram will be sent directly to your physician by the following day.  7. If you have any questions or if complications develop after you arrive home, please call [phone].  Discharge instructions have been explained to the patient.  The patient, or the person responsible for the patient, fully understands these instructions.       May resume Celexa on Jun 26, 2016, after 9:30 am.

## 2016-07-01 ENCOUNTER — Other Ambulatory Visit: Payer: Self-pay | Admitting: Family

## 2016-07-01 DIAGNOSIS — M79601 Pain in right arm: Secondary | ICD-10-CM

## 2016-07-07 DIAGNOSIS — M4316 Spondylolisthesis, lumbar region: Secondary | ICD-10-CM | POA: Diagnosis not present

## 2016-07-07 DIAGNOSIS — R030 Elevated blood-pressure reading, without diagnosis of hypertension: Secondary | ICD-10-CM | POA: Diagnosis not present

## 2016-07-07 DIAGNOSIS — M48062 Spinal stenosis, lumbar region with neurogenic claudication: Secondary | ICD-10-CM | POA: Diagnosis not present

## 2016-07-08 ENCOUNTER — Ambulatory Visit (INDEPENDENT_AMBULATORY_CARE_PROVIDER_SITE_OTHER): Payer: Medicare Other | Admitting: Pharmacist

## 2016-07-08 ENCOUNTER — Encounter: Payer: Self-pay | Admitting: Pharmacist

## 2016-07-08 VITALS — BP 142/86 | HR 64 | Ht 59.0 in | Wt 182.0 lb

## 2016-07-08 DIAGNOSIS — Z0000 Encounter for general adult medical examination without abnormal findings: Secondary | ICD-10-CM

## 2016-07-08 DIAGNOSIS — M810 Age-related osteoporosis without current pathological fracture: Secondary | ICD-10-CM | POA: Diagnosis not present

## 2016-07-08 DIAGNOSIS — H9193 Unspecified hearing loss, bilateral: Secondary | ICD-10-CM

## 2016-07-08 MED ORDER — DENOSUMAB 60 MG/ML ~~LOC~~ SOLN
60.0000 mg | Freq: Once | SUBCUTANEOUS | Status: AC
  Administered 2016-07-08: 60 mg via SUBCUTANEOUS

## 2016-07-08 NOTE — Patient Instructions (Addendum)
  Ms. Pell , Thank you for taking time to come for your Medicare Wellness Visit. I appreciate your ongoing commitment to your health goals. Please review the following plan we discussed and let me know if I can assist you in the future.   These are the goals we discussed: Discontinue Ensure - instead maintain intake of non starchy vegetable and fruits, lean proteins and whole grains.   I have sent a referral for audiologist. You will be contacted about an appointment in 1-2 weeks.     This is a list of the screening recommended for you and due dates:  Health Maintenance  Topic Date Due  . Tetanus Vaccine  02/24/2016  . Stool Blood Test  07/11/2016  . Flu Shot  09/23/2016  . DEXA scan (bone density measurement)  07/09/2017  . Mammogram  07/22/2017  . Pneumonia vaccines  Completed

## 2016-07-08 NOTE — Progress Notes (Signed)
Patient ID: Sheila [name], female   DOB: 10/03/1940, 76 y.o.   MRN: [id number]    Subjective:   Sheila [name] is a 76 y.o. female who presents for a subsequent Medicare Annual Wellness Visit and to have Prolia administered today.  She has been taking Prolia since 05/2011 and has tolerated well.  Her last DEXA was 06/2015.    Sheila [name] is widowed.  She lives alone but she has a Designer, jewellery that comes daily to assist her with ADLs.   She has 2 daughters who offer support when they can. She has a dog who is old and "sleeps most of the time.  Sheila [name] biggest health concern currently is back and leg pain.  She has seen Dr Arnoldo Morale / neurosurgeon and she is planning to have surgery in the near future.   She is also concerned about her weight which has increased about 12# since our last visit.  She feels that weight gain is realted to decreased physical activity from back pain and from Ensure that was started last year.  She has stopped drinking Ensure over the last month.   Review of Systems  Review of Systems  Constitutional: Negative.   HENT: Positive for hearing loss (she has hearing checked about 3 or 4 years ago but audiologist did not feel that she needed hearing aids yet.).   Eyes: Negative.   Respiratory: Negative.   Cardiovascular: Positive for leg swelling.  Gastrointestinal: Negative.   Genitourinary: Negative.   Musculoskeletal: Positive for back pain.  Skin: Negative.   Neurological: Negative.   Endo/Heme/Allergies: Negative.   Psychiatric/Behavioral: Positive for depression (taking citalopram).     Current Medications (verified) Outpatient Encounter Prescriptions as of 07/08/2016  Medication Sig  . albuterol (PROVENTIL HFA;VENTOLIN HFA) 108 (90 Base) MCG/ACT inhaler Inhale 2 puffs into the lungs every 6 (six) hours as needed for wheezing.  [name] albuterol (PROVENTIL) (2.5 MG/3ML) 0.083% nebulizer solution Nebulize 1 vial every 6-8 hours as needed for wheezing or  shortness of breath. (Patient taking differently: Take 2.5 mg by nebulization every 6 (six) hours as needed for wheezing or shortness of breath. Nebulize 1 vial every 6-8 hours as needed for wheezing or shortness of breath.)  . ALPRAZolam (XANAX) 0.5 MG tablet TAKE  (1)  TABLET TWICE A DAY.  [name] azelastine (ASTELIN) 0.1 % nasal spray USE 1 TO 2 SPRAYS IN EACH NOSTRIL AT BEDTIME  . clotrimazole-betamethasone (LOTRISONE) cream Apply topically 2 (two) times daily.  [name] denosumab (PROLIA) 60 MG/ML SOLN injection Inject 60 mg into the skin every 6 (six) months. Bring to office for administration.  [name] estradiol (ESTRACE) 0.1 MG/GM vaginal cream Place 1 Applicatorful vaginally every Monday, Wednesday, and Friday. Reported on 08/07/2015  . fluticasone (FLONASE) 50 MCG/ACT nasal spray USE 1 TO 2 SPRAYS IN EACH NOSTRIL AT BEDTIME  . furosemide (LASIX) 20 MG tablet TAKE 1 TABLET DAILY  . gabapentin (NEURONTIN) 300 MG capsule TAKE (1) CAPSULE THREE TIMES DAILY.  . hydroxypropyl methylcellulose / hypromellose (ISOPTO TEARS / GONIOVISC) 2.5 % ophthalmic solution Place 1 drop into both eyes as needed for dry eyes.  [name] levothyroxine (SYNTHROID, LEVOTHROID) 75 MCG tablet TAKE 1 TABLET DAILY  . meloxicam (MOBIC) 15 MG tablet Take 1 tablet (15 mg total) by mouth daily.  . Multiple Vitamins-Minerals (CENTRUM SILVER ADULT 50+ PO) Take 1 tablet by mouth every other day.  . Omega-3 Fatty Acids (FISH OIL PO) Take 2 capsules by mouth 2 (two) times daily.  [name]  pantoprazole (PROTONIX) 20 MG tablet Take 1 tablet (20 mg total) by mouth daily.  . Pitavastatin Calcium (LIVALO) 2 MG TABS Take 1 tablet (2 mg total) by mouth daily.  . potassium chloride SA (K-DUR,KLOR-CON) 20 MEQ tablet TAKE  (1)  TABLET TWICE A DAY.  . SYMBICORT 80-4.5 MCG/ACT inhaler INHALE 2 PUFFS 2 TIMES A DAY  . citalopram (CELEXA) 20 MG tablet Take 1 tablet by mouth daily.  . meclizine (ANTIVERT) 25 MG tablet Take 1 tablet by mouth as needed.  . [EXPIRED] denosumab  (PROLIA) injection 60 mg    No facility-administered encounter medications on file as of 07/08/2016.     Allergies (verified) Ibuprofen; Morphine and related; Benadryl [diphenhydramine hcl]; Tdap [diphth-acell pertussis-tetanus]; Codeine; and Lipitor [atorvastatin]   History: Past Medical History:  Diagnosis Date  . Allergy   . Bell's palsy   . Cancer ([place])    skin CA on face  . Cataract   . Chest pain 07/2009   Myoveiw stress test negative.  . Depression   . Diverticulosis of colon   . DJD (degenerative joint disease) of knee   . Gastric ulcer with hemorrhage 01/14/2011  . History of fractured pelvis   . Hypercholesteremia   . Hypertension   . Hypothyroidism   . UTI (lower urinary tract infection)    Past Surgical History:  Procedure Laterality Date  . APPENDECTOMY    . BREAST BIOPSY Left   . ESOPHAGOGASTRODUODENOSCOPY  01/14/2011   Procedure: ESOPHAGOGASTRODUODENOSCOPY (EGD);  Surgeon: Rogene Houston, MD;  Location: [facility];  Service: Endoscopy;  Laterality: N/A;  . HIP SURGERY     pelvis  . JOINT REPLACEMENT    . KNEE SURGERY  04/2010.   Total left knee replacement  . LAPAROSCOPIC APPENDECTOMY N/A 08/01/2013   Procedure: APPENDECTOMY LAPAROSCOPIC;  Surgeon: Jamesetta So, MD;  Location: [facility];  Service: General;  Laterality: N/A;   Family History  Problem Relation Age of Onset  . Aneurysm Mother   . Stroke Mother   . Hypertension Mother   . Cancer Father        lung  . Heart disease Sister   . Hip fracture Sister   . Cancer Brother   . Alzheimer's disease Sister   . Cancer Sister        skin, uterine  . Cancer Brother    Social History   Occupational History  . Not on file.   Social History Main Topics  . Smoking status: Former Smoker    Types: Cigarettes    Quit date: 02/24/1968  . Smokeless tobacco: Current User    Types: Snuff     Comment: quit yrs and yrs ago when she was very young  . Alcohol use No  . Drug use: No  . Sexual activity:  No    Do you feel safe at home?  Yes Are there smokers in your home (other than you)? No  Dietary issues and exercise activities: Current Exercise Habits: The patient does not participate in regular exercise at present, Exercise limited by: orthopedic condition(s);neurologic condition(s)  Current Dietary habits:  Had been drinking Ensure but stopped about 1 month ago.  Eats lots of fruits and vegetables.    Objective:    Today's Vitals   07/08/16 1543 07/08/16 1557  BP: (!) 146/88 (!) 142/86  Pulse: 64   Weight:  182 lb (82.6 kg)  Height:  4\' 11"  (1.499 m)  PainSc:  5   PainLoc:  Back  Body mass index is 36.76 kg/m.  Activities of Daily Living In your present state of health, do you have any difficulty performing the following activities: 07/08/2016 07/10/2015  Hearing? Y N  Vision? N N  Difficulty concentrating or making decisions? N N  Walking or climbing stairs? Y Y  Dressing or bathing? N Y  Doing errands, shopping? N Y  Conservation officer, nature and eating ? N N  Using the Toilet? N N  In the past six months, have you accidently leaked urine? N N  Do you have problems with loss of bowel control? N N  Managing your Medications? N N  Managing your Finances? N N  Housekeeping or managing your Housekeeping? Y N  Some recent data might be hidden     Cardiac Risk Factors include: advanced age (>32men, >28 women);dyslipidemia;family history of premature cardiovascular disease;hypertension;obesity (BMI >30kg/m2);sedentary lifestyle  Depression Screen PHQ 2/9 Scores 07/08/2016 05/11/2016 04/09/2016 02/10/2016  PHQ - 2 Score 2 1 1 1   PHQ- 9 Score 4 - - -     Fall Risk Fall Risk  07/08/2016 05/11/2016 04/09/2016 01/07/2016 11/07/2015  Falls in the past year? No No No No No  Number falls in past yr: - - - - -  Injury with Fall? - - - - -    Cognitive Function: MMSE - Mini Mental State Exam 07/08/2016 07/10/2015 07/06/2014 07/06/2014  Not completed: Unable to complete Unable to complete  - -  Orientation to time 3 4 5 5   Orientation to Place 3 5 2 2   Registration 3 3 3 3   Attention/ Calculation 0 0 0 0  Recall 3 3 3 3   Language- name 2 objects 2 2 2  -  Language- repeat 1 1 1  -  Language- follow 3 step command 3 3 3  -  Language- read & follow direction 0 1 0 -  Language-read & follow direction-comments - - patient is not able to compete this question because does not read -  Write a sentence 0 0 0 -  Write a sentence-comments - - unable to complete this question b/c does not read -  Copy design 1 0 1 -  Total score 19 22 20  -    Immunizations and Health Maintenance Immunization History  Administered Date(s) Administered  . Influenza, High Dose Seasonal PF 02/10/2016  . Influenza,inj,Quad PF,36+ Mos 11/15/2012, 12/11/2013, 01/07/2015  . Pneumococcal Conjugate-13 07/06/2014  . Pneumococcal Polysaccharide-23 12/12/2007  . Tdap 02/23/2006   Health Maintenance Due  Topic Date Due  . TETANUS/TDAP  02/24/2016    Patient Care Team: Sharion Balloon, FNP as PCP - General (Family Medicine) Rogene Houston, MD as Consulting Physician (Gastroenterology) Burnell Blanks, MD as Consulting Physician (Cardiology)  Indicate any recent Medical Services you may have received from other than [facility] providers in the past year (date may be approximate).    Assessment:    Annual Wellness Visit  Osteoporosis Obesity - patient has lost 2#since stopping Ensure Depression - decrease in PHQ9 score but when questioned further some of symptoms are related to pain in back  Back pain - patient is seeing neurosurgeon for this and awaiting notification of when surgery will be scheduled [facility]   Screening Tests Health Maintenance  Topic Date Due  . TETANUS/TDAP  02/24/2016  . COLON CANCER SCREENING ANNUAL FOBT  07/11/2016  . INFLUENZA VACCINE  09/23/2016  . DEXA SCAN  07/09/2017  . MAMMOGRAM  07/22/2017  . PNA vac Low Risk Adult  Completed  Plan:   During the  course of the visit Merrell was educated and counseled about the following appropriate screening and preventive services:   Vaccines to include Pneumoccal, Influenza, Td, shingles - pt declined Td and Shingrix  Colorectal cancer screening - patient given FOBT today  Cardiovascular disease - last EKG 2018  Diabetes screening - UTD, last FBG 98  Bone Denisty / Osteoporosis Screening - DEXA due 2019  Prolia administered in office today - pt provided medication from pharmacy  Mammogram - appt made today for 11/16/2016  PAP - last 07/2015  Glaucoma screening / Eye Exam - UTD  Nutrition counseling - Discussed increasing fruits and vegetables, whole grains and lean proteins.   Tobacco cessation counseling - offered counseling - patient declined  Advanced Directives - had - copy requested  Physical Activity - PT planned for after surgery and patient is hoping to restart walking if surgery is successful.  Referral made to audiologist to recheck hearing  Orders Placed This Encounter  Procedures  . Ambulatory referral to Audiology    Referral Priority:   Routine    Referral Type:   Audiology Exam    Referral Reason:   Specialty Services Required    Number of Visits Requested:   1    Patient Instructions (the written plan) were given to the patient.   Cherre Robins, PharmD   07/08/2016         Patient ID: [name], female   DOB: 01/09/1941, 76 y.o.   MRN: [id number]

## 2016-07-10 ENCOUNTER — Other Ambulatory Visit: Payer: Medicare Other

## 2016-07-10 ENCOUNTER — Other Ambulatory Visit: Payer: Self-pay | Admitting: *Deleted

## 2016-07-10 DIAGNOSIS — Z1212 Encounter for screening for malignant neoplasm of rectum: Principal | ICD-10-CM

## 2016-07-10 DIAGNOSIS — Z1211 Encounter for screening for malignant neoplasm of colon: Secondary | ICD-10-CM

## 2016-07-11 LAB — FECAL OCCULT BLOOD, IMMUNOCHEMICAL: Fecal Occult Bld: POSITIVE — AB

## 2016-07-13 ENCOUNTER — Other Ambulatory Visit: Payer: Self-pay | Admitting: Neurosurgery

## 2016-07-16 ENCOUNTER — Other Ambulatory Visit: Payer: Medicare Other

## 2016-07-16 ENCOUNTER — Other Ambulatory Visit: Payer: Self-pay | Admitting: *Deleted

## 2016-07-16 DIAGNOSIS — Z1212 Encounter for screening for malignant neoplasm of rectum: Secondary | ICD-10-CM | POA: Diagnosis not present

## 2016-07-16 DIAGNOSIS — Z1211 Encounter for screening for malignant neoplasm of colon: Secondary | ICD-10-CM

## 2016-07-17 LAB — FECAL OCCULT BLOOD, IMMUNOCHEMICAL: Fecal Occult Bld: NEGATIVE

## 2016-07-24 DIAGNOSIS — M150 Primary generalized (osteo)arthritis: Secondary | ICD-10-CM | POA: Diagnosis not present

## 2016-07-27 ENCOUNTER — Other Ambulatory Visit: Payer: Self-pay | Admitting: Family

## 2016-07-27 DIAGNOSIS — M79601 Pain in right arm: Secondary | ICD-10-CM

## 2016-07-31 ENCOUNTER — Other Ambulatory Visit: Payer: Self-pay | Admitting: Family

## 2016-07-31 DIAGNOSIS — J309 Allergic rhinitis, unspecified: Secondary | ICD-10-CM

## 2016-08-03 ENCOUNTER — Other Ambulatory Visit: Payer: Self-pay | Admitting: Family

## 2016-08-07 ENCOUNTER — Other Ambulatory Visit: Payer: Self-pay | Admitting: Family

## 2016-08-13 ENCOUNTER — Ambulatory Visit (INDEPENDENT_AMBULATORY_CARE_PROVIDER_SITE_OTHER): Payer: Medicare Other | Admitting: Family

## 2016-08-13 ENCOUNTER — Encounter: Payer: Self-pay | Admitting: Family

## 2016-08-13 VITALS — BP 134/88 | HR 81 | Ht 59.0 in | Wt 189.2 lb

## 2016-08-13 DIAGNOSIS — E039 Hypothyroidism, unspecified: Secondary | ICD-10-CM

## 2016-08-13 DIAGNOSIS — F411 Generalized anxiety disorder: Secondary | ICD-10-CM

## 2016-08-13 DIAGNOSIS — F331 Major depressive disorder, recurrent, moderate: Secondary | ICD-10-CM

## 2016-08-13 DIAGNOSIS — K279 Peptic ulcer, site unspecified, unspecified as acute or chronic, without hemorrhage or perforation: Secondary | ICD-10-CM | POA: Diagnosis not present

## 2016-08-13 DIAGNOSIS — M545 Low back pain: Secondary | ICD-10-CM | POA: Diagnosis not present

## 2016-08-13 DIAGNOSIS — M8949 Other hypertrophic osteoarthropathy, multiple sites: Secondary | ICD-10-CM

## 2016-08-13 DIAGNOSIS — M159 Polyosteoarthritis, unspecified: Secondary | ICD-10-CM

## 2016-08-13 DIAGNOSIS — G8929 Other chronic pain: Secondary | ICD-10-CM

## 2016-08-13 DIAGNOSIS — I10 Essential (primary) hypertension: Secondary | ICD-10-CM

## 2016-08-13 DIAGNOSIS — Z0289 Encounter for other administrative examinations: Secondary | ICD-10-CM

## 2016-08-13 DIAGNOSIS — M810 Age-related osteoporosis without current pathological fracture: Secondary | ICD-10-CM

## 2016-08-13 DIAGNOSIS — M150 Primary generalized (osteo)arthritis: Secondary | ICD-10-CM

## 2016-08-13 DIAGNOSIS — E785 Hyperlipidemia, unspecified: Secondary | ICD-10-CM

## 2016-08-13 DIAGNOSIS — F1120 Opioid dependence, uncomplicated: Secondary | ICD-10-CM

## 2016-08-13 NOTE — Patient Instructions (Signed)

## 2016-08-13 NOTE — Progress Notes (Signed)
 Subjective:    Patient ID: Sheila [name], female    DOB: 06/29/1940, 76 y.o.   MRN: [id number]  Pt presents to the office today for chronic follow up. PT states she stopped her Ultram because they "were not working".  Hypertension  This is a chronic problem. The current episode started more than 1 year ago. The problem has been waxing and waning since onset. The problem is controlled. Associated symptoms include anxiety. Pertinent negatives include no peripheral edema or shortness of breath. Risk factors for coronary artery disease include dyslipidemia, obesity, post-menopausal state, sedentary lifestyle and family history. There is no history of kidney disease, CAD/MI or heart failure.  Gastroesophageal Reflux  She reports no belching or no heartburn. This is a chronic problem. The problem occurs occasionally. The problem has been resolved. She has tried a PPI for the symptoms. The treatment provided moderate relief.  Anxiety  Presents for follow-up visit. Symptoms include excessive worry, irritability, nervous/anxious behavior and restlessness. Patient reports no shortness of breath. Symptoms occur occasionally. The quality of sleep is good.    Depression         This is a chronic problem.  The current episode started more than 1 year ago.   The onset quality is gradual.   Associated symptoms include restlessness and sad.  Associated symptoms include no helplessness and no hopelessness.     The symptoms are aggravated by family issues.  Compliance with treatment is good.  Past medical history includes anxiety.   Arthritis  Presents for follow-up visit. She complains of pain and stiffness. Affected locations include the right knee and left knee (back). Her pain is at a severity of 0/10.  Osteoporosis  Pt taking prolia every 6 months. Last Dexa Scan 07/10/15.    Review of Systems  Constitutional: Positive for irritability.  Respiratory: Negative for shortness of breath.   Gastrointestinal:  Negative for heartburn.  Musculoskeletal: Positive for arthritis and stiffness.  Psychiatric/Behavioral: Positive for depression. The patient is nervous/anxious.   All other systems reviewed and are negative.      Objective:   Physical Exam  Constitutional: She is oriented to person, place, and time. She appears well-developed and well-nourished. No distress.  HENT:  Head: Normocephalic and atraumatic.  Right Ear: External ear normal.  Left Ear: External ear normal.  Nose: Nose normal.  Mouth/Throat: Oropharynx is clear and moist.  Eyes: Pupils are equal, round, and reactive to light.  Neck: Normal range of motion. Neck supple. No thyromegaly present.  Cardiovascular: Normal rate, regular rhythm, normal heart sounds and intact distal pulses.   No murmur heard. Pulmonary/Chest: Effort normal and breath sounds normal. No respiratory distress. She has no wheezes.  Abdominal: Soft. Bowel sounds are normal. She exhibits no distension. There is no tenderness.  Musculoskeletal: She exhibits no edema or tenderness.  Pt using rolling walker, pain in lower back with flexion and extension  Neurological: She is alert and oriented to person, place, and time.  Skin: Skin is warm and dry.  Psychiatric: She has a normal mood and affect. Her behavior is normal. Judgment and thought content normal.  Vitals reviewed.    BP 134/88   Pulse 81   Ht 4' 11" (1.499 m)   Wt 189 lb 3.2 oz (85.8 kg)   BMI 38.21 kg/m      Assessment & Plan:  1. Essential hypertension, benign - CMP14+EGFR  2. Hypothyroidism, unspecified type - CMP14+EGFR - Thyroid Panel With TSH  3. PUD (peptic   ulcer disease) - CMP14+EGFR  4. Primary osteoarthritis involving multiple joints - CMP14+EGFR  5. Hyperlipidemia, unspecified hyperlipidemia type - CMP14+EGFR - Lipid panel  6. GAD (generalized anxiety disorder - CMP14+EGFR  7. Moderate episode of recurrent major depressive disorder (HCC) - CMP14+EGFR  8.  Morbid obesity ([place]) - CMP14+EGFR  9. Uncomplicated opioid dependence ([place]) - CMP14+EGFR  10. Pain medication agreement signed - CMP14+EGFR  11. Chronic bilateral low back pain, with sciatica presence unspecified - CMP14+EGFR  12. Osteoporosis, unspecified osteoporosis type, unspecified pathological fracture presence   Continue all meds Labs pending Health Maintenance reviewed Diet and exercise encouraged RTO 4 months   [name], FNP

## 2016-08-14 ENCOUNTER — Encounter (HOSPITAL_COMMUNITY)
Admission: RE | Admit: 2016-08-14 | Discharge: 2016-08-14 | Disposition: A | Discharge status: Home / Self Care | Payer: Medicare Other | Source: Ambulatory Visit | Attending: Neurosurgery | Admitting: Neurosurgery

## 2016-08-14 ENCOUNTER — Encounter (HOSPITAL_COMMUNITY): Payer: Self-pay

## 2016-08-14 DIAGNOSIS — Z01812 Encounter for preprocedural laboratory examination: Secondary | ICD-10-CM | POA: Diagnosis not present

## 2016-08-14 HISTORY — DX: Headache: R51

## 2016-08-14 HISTORY — DX: Gastro-esophageal reflux disease without esophagitis: K21.9

## 2016-08-14 HISTORY — DX: Dyspnea, unspecified: R06.00

## 2016-08-14 HISTORY — DX: Family history of other specified conditions: Z84.89

## 2016-08-14 HISTORY — DX: Headache, unspecified: R51.9

## 2016-08-14 LAB — CBC
HCT: 35.4 % — ABNORMAL LOW (ref 36.0–46.0)
Hemoglobin: 11.9 g/dL — ABNORMAL LOW (ref 12.0–15.0)
MCH: 29.4 pg (ref 26.0–34.0)
MCHC: 33.6 g/dL (ref 30.0–36.0)
MCV: 87.4 fL (ref 78.0–100.0)
Platelets: 285 10*3/uL (ref 150–400)
RBC: 4.05 MIL/uL (ref 3.87–5.11)
RDW: 14 % (ref 11.5–15.5)
WBC: 10.9 10*3/uL — ABNORMAL HIGH (ref 4.0–10.5)

## 2016-08-14 LAB — CMP14+EGFR
ALT: 18 IU/L (ref 0–32)
AST: 24 IU/L (ref 0–40)
Albumin/Globulin Ratio: 1.8 (ref 1.2–2.2)
Albumin: 4.7 g/dL (ref 3.5–4.8)
Alkaline Phosphatase: 64 IU/L (ref 39–117)
BUN/Creatinine Ratio: 11 — ABNORMAL LOW (ref 12–28)
BUN: 7 mg/dL — ABNORMAL LOW (ref 8–27)
Bilirubin Total: 0.8 mg/dL (ref 0.0–1.2)
CO2: 20 mmol/L (ref 20–29)
Calcium: 8.8 mg/dL (ref 8.7–10.3)
Chloride: 106 mmol/L (ref 96–106)
Creatinine, Ser: 0.65 mg/dL (ref 0.57–1.00)
GFR calc Af Amer: 100 mL/min/{1.73_m2} (ref >59)
GFR calc non Af Amer: 87 mL/min/{1.73_m2} (ref >59)
Globulin, Total: 2.6 g/dL (ref 1.5–4.5)
Glucose: 84 mg/dL (ref 65–99)
Potassium: 4.4 mmol/L (ref 3.5–5.2)
Sodium: 143 mmol/L (ref 134–144)
Total Protein: 7.3 g/dL (ref 6.0–8.5)

## 2016-08-14 LAB — LIPID PANEL
Chol/HDL Ratio: 4.8 ratio — ABNORMAL HIGH (ref 0.0–4.4)
Cholesterol, Total: 223 mg/dL — ABNORMAL HIGH (ref 100–199)
HDL: 46 mg/dL (ref >39)
LDL Calculated: 144 mg/dL — ABNORMAL HIGH (ref 0–99)
Triglycerides: 163 mg/dL — ABNORMAL HIGH (ref 0–149)
VLDL Cholesterol Cal: 33 mg/dL (ref 5–40)

## 2016-08-14 LAB — SURGICAL PCR SCREEN
MRSA, PCR: NEGATIVE
Staphylococcus aureus: NEGATIVE

## 2016-08-14 LAB — THYROID PANEL WITH TSH
Free Thyroxine Index: 2.4 (ref 1.2–4.9)
T3 Uptake Ratio: 26 % (ref 24–39)
T4, Total: 9.2 ug/dL (ref 4.5–12.0)
TSH: 0.886 u[IU]/mL (ref 0.450–4.500)

## 2016-08-14 LAB — ABO/RH: ABO/RH(D): O POS

## 2016-08-14 LAB — TYPE AND SCREEN
ABO/RH(D): O POS
Antibody Screen: NEGATIVE

## 2016-08-14 NOTE — Pre-Procedure Instructions (Addendum)
LISSETT FAVORITE  08/14/2016      [place], [place] [address] Phone: [phone] Fax: [phone]    Your procedure is scheduled on Monday, July 2  Report to [facility] Admitting at 10:00 A.M.  Call this number if you have problems the morning of surgery:  [phone]   Remember:  Do not eat food or drink liquids after midnight on Sun. July 1   Take these medicines the morning of surgery with A SIP OF WATER : albuterol inhaler if needed-bring to hospital, nebulizer if needed, alprazolam(xanax), citalopram(celexa), gabapentin(neurontin), eye drops if needed, levothyroxine(synthroid), pantoprazole(protonix),symbicort-bring to hospital,meclizine(antivert) if needed              1 week prior to surgery stop: advil, motrin, aleve, ibuprofen, mobic, BC Powders, Goody's, vitamins/herbal medicines.   Do not wear jewelry, make-up or nail polish.  Do not wear lotions, powders, or perfumes, or deoderant.  Do not shave 48 hours prior to surgery.  Men may shave face and neck.  Do not bring valuables to the hospital.  [facility] is not responsible for any belongings or valuables.  Contacts, dentures or bridgework may not be worn into surgery.  Leave your suitcase in the car.  After surgery it may be brought to your room.  For patients admitted to the hospital, discharge time will be determined by your treatment team.  Patients discharged the day of surgery will not be allowed to drive home.    Special instructions:  [place]- Preparing For Surgery  Before surgery, you can play an important role. Because skin is not sterile, your skin needs to be as free of germs as possible. You can reduce the number of germs on your skin by washing with CHG (chlorahexidine gluconate) Soap before surgery.  CHG is an antiseptic cleaner which kills germs and bonds with the skin to continue killing germs even after  washing.  Please do not use if you have an allergy to CHG or antibacterial soaps. If your skin becomes reddened/irritated stop using the CHG.  Do not shave (including legs and underarms) for at least 48 hours prior to first CHG shower. It is OK to shave your face.  Please follow these instructions carefully.   1. Shower the NIGHT BEFORE SURGERY and the MORNING OF SURGERY with CHG.   2. If you chose to wash your hair, wash your hair first as usual with your normal shampoo.  3. After you shampoo, rinse your hair and body thoroughly to remove the shampoo.  4. Use CHG as you would any other liquid soap. You can apply CHG directly to the skin and wash gently with a scrungie or a clean washcloth.   5. Apply the CHG Soap to your body ONLY FROM THE NECK DOWN.  Do not use on open wounds or open sores. Avoid contact with your eyes, ears, mouth and genitals (private parts). Wash genitals (private parts) with your normal soap.  6. Wash thoroughly, paying special attention to the area where your surgery will be performed.  7. Thoroughly rinse your body with warm water from the neck down.  8. DO NOT shower/wash with your normal soap after using and rinsing off the CHG Soap.  9. Pat yourself dry with a CLEAN TOWEL.   10. Wear CLEAN PAJAMAS   11. Place CLEAN SHEETS on your bed the night of your first shower and DO NOT SLEEP  WITH PETS.    Day of Surgery: Do not apply any deodorants/lotions. Please wear clean clothes to the hospital/surgery center.      Please read over the following fact sheets that you were given. Coughing and Deep Breathing, MRSA Information and Surgical Site Infection Prevention

## 2016-08-14 NOTE — Progress Notes (Signed)
PCP: Almond Lint @ [place]. Pt. Reported skin breakdown on her abdomen where folds of skin overlap. She has been using a cream her doctor. She also uses cornstrach. The area on right side is redden and the left is about betw. Nickel/quarted size. Pt. Stated it had bleed. No scab is formed. Left message for [facility] @ Dr. Arnoldo Morale' office of above.

## 2016-08-18 ENCOUNTER — Other Ambulatory Visit: Payer: Self-pay | Admitting: Family

## 2016-08-19 ENCOUNTER — Other Ambulatory Visit: Payer: Self-pay | Admitting: Family

## 2016-08-24 ENCOUNTER — Inpatient Hospital Stay (HOSPITAL_COMMUNITY): Payer: Medicare Other

## 2016-08-24 ENCOUNTER — Encounter (HOSPITAL_COMMUNITY): Payer: Self-pay

## 2016-08-24 ENCOUNTER — Inpatient Hospital Stay (HOSPITAL_COMMUNITY): Payer: Medicare Other | Admitting: Certified Registered Nurse Anesthetist

## 2016-08-24 ENCOUNTER — Inpatient Hospital Stay (HOSPITAL_COMMUNITY)
Admission: RE | Admit: 2016-08-24 | Discharge: 2016-08-25 | DRG: 455 | Disposition: A | Discharge status: Home / Self Care | Payer: Medicare Other | Source: Ambulatory Visit | Attending: Neurosurgery | Admitting: Neurosurgery

## 2016-08-24 ENCOUNTER — Encounter (HOSPITAL_COMMUNITY)
Admission: RE | Disposition: A | Discharge status: Home / Self Care | Payer: Self-pay | Source: Ambulatory Visit | Attending: Neurosurgery

## 2016-08-24 DIAGNOSIS — F329 Major depressive disorder, single episode, unspecified: Secondary | ICD-10-CM | POA: Diagnosis present

## 2016-08-24 DIAGNOSIS — Z886 Allergy status to analgesic agent status: Secondary | ICD-10-CM

## 2016-08-24 DIAGNOSIS — Z7983 Long term (current) use of bisphosphonates: Secondary | ICD-10-CM

## 2016-08-24 DIAGNOSIS — Z6838 Body mass index (BMI) 38.0-38.9, adult: Secondary | ICD-10-CM

## 2016-08-24 DIAGNOSIS — E039 Hypothyroidism, unspecified: Secondary | ICD-10-CM | POA: Diagnosis not present

## 2016-08-24 DIAGNOSIS — Z79899 Other long term (current) drug therapy: Secondary | ICD-10-CM

## 2016-08-24 DIAGNOSIS — E7800 Pure hypercholesterolemia, unspecified: Secondary | ICD-10-CM | POA: Diagnosis not present

## 2016-08-24 DIAGNOSIS — K219 Gastro-esophageal reflux disease without esophagitis: Secondary | ICD-10-CM | POA: Diagnosis not present

## 2016-08-24 DIAGNOSIS — Z888 Allergy status to other drugs, medicaments and biological substances status: Secondary | ICD-10-CM | POA: Diagnosis not present

## 2016-08-24 DIAGNOSIS — M9689 Other intraoperative and postprocedural complications and disorders of the musculoskeletal system: Secondary | ICD-10-CM | POA: Diagnosis not present

## 2016-08-24 DIAGNOSIS — Z7951 Long term (current) use of inhaled steroids: Secondary | ICD-10-CM

## 2016-08-24 DIAGNOSIS — Z720 Tobacco use: Secondary | ICD-10-CM | POA: Diagnosis not present

## 2016-08-24 DIAGNOSIS — Z887 Allergy status to serum and vaccine status: Secondary | ICD-10-CM | POA: Diagnosis not present

## 2016-08-24 DIAGNOSIS — Z85828 Personal history of other malignant neoplasm of skin: Secondary | ICD-10-CM | POA: Diagnosis not present

## 2016-08-24 DIAGNOSIS — M5116 Intervertebral disc disorders with radiculopathy, lumbar region: Secondary | ICD-10-CM | POA: Diagnosis present

## 2016-08-24 DIAGNOSIS — Z791 Long term (current) use of non-steroidal anti-inflammatories (NSAID): Secondary | ICD-10-CM

## 2016-08-24 DIAGNOSIS — M4726 Other spondylosis with radiculopathy, lumbar region: Secondary | ICD-10-CM | POA: Diagnosis not present

## 2016-08-24 DIAGNOSIS — Z419 Encounter for procedure for purposes other than remedying health state, unspecified: Secondary | ICD-10-CM

## 2016-08-24 DIAGNOSIS — M48062 Spinal stenosis, lumbar region with neurogenic claudication: Secondary | ICD-10-CM | POA: Diagnosis not present

## 2016-08-24 DIAGNOSIS — G9520 Unspecified cord compression: Secondary | ICD-10-CM | POA: Diagnosis not present

## 2016-08-24 DIAGNOSIS — Z885 Allergy status to narcotic agent status: Secondary | ICD-10-CM

## 2016-08-24 DIAGNOSIS — M4316 Spondylolisthesis, lumbar region: Principal | ICD-10-CM | POA: Diagnosis present

## 2016-08-24 DIAGNOSIS — M4326 Fusion of spine, lumbar region: Secondary | ICD-10-CM | POA: Diagnosis not present

## 2016-08-24 SURGERY — POSTERIOR LUMBAR FUSION 1 LEVEL
Anesthesia: General | Site: Back

## 2016-08-24 MED ORDER — FENTANYL CITRATE (PF) 250 MCG/5ML IJ SOLN
INTRAMUSCULAR | Status: AC
  Filled 2016-08-24: qty 5

## 2016-08-24 MED ORDER — PANTOPRAZOLE SODIUM 40 MG PO TBEC
80.0000 mg | DELAYED_RELEASE_TABLET | Freq: Every day | ORAL | Status: DC
  Administered 2016-08-24: 80 mg via ORAL
  Filled 2016-08-24: qty 2

## 2016-08-24 MED ORDER — ONDANSETRON HCL 4 MG/2ML IJ SOLN
INTRAMUSCULAR | Status: DC | PRN
  Administered 2016-08-24: 4 mg via INTRAVENOUS

## 2016-08-24 MED ORDER — GABAPENTIN 300 MG PO CAPS
300.0000 mg | ORAL_CAPSULE | Freq: Three times a day (TID) | ORAL | Status: DC
  Administered 2016-08-24: 300 mg via ORAL
  Filled 2016-08-24: qty 1

## 2016-08-24 MED ORDER — SODIUM CHLORIDE 0.9% FLUSH
3.0000 mL | INTRAVENOUS | Status: DC | PRN
Start: 2016-08-24 — End: 2016-08-25

## 2016-08-24 MED ORDER — MENTHOL 3 MG MT LOZG: 1.0000 | LOZENGE | OROMUCOSAL | Status: DC | PRN

## 2016-08-24 MED ORDER — KETOROLAC TROMETHAMINE 30 MG/ML IJ SOLN
INTRAMUSCULAR | Status: AC
  Filled 2016-08-24: qty 2

## 2016-08-24 MED ORDER — BACITRACIN ZINC 500 UNIT/GM EX OINT
TOPICAL_OINTMENT | CUTANEOUS | Status: DC | PRN
  Administered 2016-08-24: 1 via TOPICAL

## 2016-08-24 MED ORDER — CEFAZOLIN SODIUM-DEXTROSE 2-3 GM-% IV SOLR
INTRAVENOUS | Status: DC | PRN
  Administered 2016-08-24: 2 g via INTRAVENOUS

## 2016-08-24 MED ORDER — LIDOCAINE 2% (20 MG/ML) 5 ML SYRINGE
INTRAMUSCULAR | Status: DC | PRN
Start: 2016-08-24 — End: 2016-08-24
  Administered 2016-08-24: 60 mg via INTRAVENOUS

## 2016-08-24 MED ORDER — 0.9 % SODIUM CHLORIDE (POUR BTL) OPTIME
TOPICAL | Status: DC | PRN
  Administered 2016-08-24: 1000 mL

## 2016-08-24 MED ORDER — HYDROMORPHONE HCL 2 MG PO TABS
4.0000 mg | ORAL_TABLET | ORAL | Status: DC | PRN
  Administered 2016-08-24 – 2016-08-25 (×2): 4 mg via ORAL
  Filled 2016-08-24 (×3): qty 2

## 2016-08-24 MED ORDER — THROMBIN 20000 UNITS EX SOLR
CUTANEOUS | Status: AC
  Filled 2016-08-24: qty 20000

## 2016-08-24 MED ORDER — SODIUM CHLORIDE 0.9 % IR SOLN
Status: DC | PRN
  Administered 2016-08-24: 500 mL

## 2016-08-24 MED ORDER — HYDRALAZINE HCL 20 MG/ML IJ SOLN
INTRAMUSCULAR | Status: AC
  Filled 2016-08-24: qty 1

## 2016-08-24 MED ORDER — DEXAMETHASONE SODIUM PHOSPHATE 10 MG/ML IJ SOLN
INTRAMUSCULAR | Status: AC
  Filled 2016-08-24: qty 1

## 2016-08-24 MED ORDER — FENTANYL CITRATE (PF) 250 MCG/5ML IJ SOLN
INTRAMUSCULAR | Status: DC | PRN
  Administered 2016-08-24 (×5): 50 ug via INTRAVENOUS

## 2016-08-24 MED ORDER — ACETAMINOPHEN 650 MG RE SUPP: 650.0000 mg | RECTAL | Status: DC | PRN

## 2016-08-24 MED ORDER — HYDROMORPHONE HCL 1 MG/ML IJ SOLN
0.2500 mg | INTRAMUSCULAR | Status: DC | PRN
  Administered 2016-08-24 (×2): 0.25 mg via INTRAVENOUS

## 2016-08-24 MED ORDER — HYDROMORPHONE HCL 1 MG/ML IJ SOLN: 1.0000 mg | INTRAMUSCULAR | Status: DC | PRN

## 2016-08-24 MED ORDER — CHLORHEXIDINE GLUCONATE CLOTH 2 % EX PADS: 6.0000 | MEDICATED_PAD | Freq: Once | CUTANEOUS | Status: DC

## 2016-08-24 MED ORDER — ALBUTEROL SULFATE HFA 108 (90 BASE) MCG/ACT IN AERS: 2.0000 | INHALATION_SPRAY | Freq: Four times a day (QID) | RESPIRATORY_TRACT | Status: DC | PRN

## 2016-08-24 MED ORDER — PROMETHAZINE HCL 25 MG/ML IJ SOLN: 6.2500 mg | INTRAMUSCULAR | Status: DC | PRN

## 2016-08-24 MED ORDER — HYDROMORPHONE HCL 1 MG/ML IJ SOLN
INTRAMUSCULAR | Status: AC
  Administered 2016-08-24: 0.25 mg via INTRAVENOUS
  Filled 2016-08-24: qty 0.5

## 2016-08-24 MED ORDER — CYCLOBENZAPRINE HCL 10 MG PO TABS
10.0000 mg | ORAL_TABLET | Freq: Three times a day (TID) | ORAL | Status: DC | PRN
  Administered 2016-08-24: 10 mg via ORAL
  Filled 2016-08-24: qty 1

## 2016-08-24 MED ORDER — SODIUM CHLORIDE 0.9 % IV SOLN: 250.0000 mL | INTRAVENOUS | Status: DC

## 2016-08-24 MED ORDER — PHENYLEPHRINE 40 MCG/ML (10ML) SYRINGE FOR IV PUSH (FOR BLOOD PRESSURE SUPPORT)
PREFILLED_SYRINGE | INTRAVENOUS | Status: AC
  Filled 2016-08-24: qty 10

## 2016-08-24 MED ORDER — PROPOFOL 10 MG/ML IV BOLUS
INTRAVENOUS | Status: AC
  Filled 2016-08-24: qty 20

## 2016-08-24 MED ORDER — MIDAZOLAM HCL 2 MG/2ML IJ SOLN
INTRAMUSCULAR | Status: DC | PRN
  Administered 2016-08-24: 1 mg via INTRAVENOUS

## 2016-08-24 MED ORDER — ONDANSETRON HCL 4 MG/2ML IJ SOLN
INTRAMUSCULAR | Status: AC
  Filled 2016-08-24: qty 2

## 2016-08-24 MED ORDER — LIDOCAINE 2% (20 MG/ML) 5 ML SYRINGE
INTRAMUSCULAR | Status: AC
  Filled 2016-08-24: qty 10

## 2016-08-24 MED ORDER — FLUTICASONE PROPIONATE 50 MCG/ACT NA SUSP
1.0000 | Freq: Every day | NASAL | Status: DC
  Filled 2016-08-24: qty 16

## 2016-08-24 MED ORDER — THROMBIN 5000 UNITS EX SOLR
CUTANEOUS | Status: AC
  Filled 2016-08-24: qty 5000

## 2016-08-24 MED ORDER — CEFAZOLIN SODIUM-DEXTROSE 2-4 GM/100ML-% IV SOLN
INTRAVENOUS | Status: AC
  Filled 2016-08-24: qty 100

## 2016-08-24 MED ORDER — ZOLPIDEM TARTRATE 5 MG PO TABS: 5.0000 mg | ORAL_TABLET | Freq: Every evening | ORAL | Status: DC | PRN

## 2016-08-24 MED ORDER — EPHEDRINE 5 MG/ML INJ
INTRAVENOUS | Status: AC
  Filled 2016-08-24: qty 10

## 2016-08-24 MED ORDER — EPHEDRINE SULFATE-NACL 50-0.9 MG/10ML-% IV SOSY
PREFILLED_SYRINGE | INTRAVENOUS | Status: DC | PRN
  Administered 2016-08-24: 5 mg via INTRAVENOUS

## 2016-08-24 MED ORDER — HYDRALAZINE HCL 20 MG/ML IJ SOLN
INTRAMUSCULAR | Status: DC | PRN
  Administered 2016-08-24 (×3): 5 mg via INTRAVENOUS

## 2016-08-24 MED ORDER — SUGAMMADEX SODIUM 200 MG/2ML IV SOLN
INTRAVENOUS | Status: AC
  Filled 2016-08-24: qty 2

## 2016-08-24 MED ORDER — SUGAMMADEX SODIUM 200 MG/2ML IV SOLN
INTRAVENOUS | Status: DC | PRN
  Administered 2016-08-24: 200 mg via INTRAVENOUS

## 2016-08-24 MED ORDER — ONDANSETRON HCL 4 MG/2ML IJ SOLN: 4.0000 mg | Freq: Four times a day (QID) | INTRAMUSCULAR | Status: DC | PRN

## 2016-08-24 MED ORDER — CITALOPRAM HYDROBROMIDE 20 MG PO TABS
20.0000 mg | ORAL_TABLET | Freq: Every day | ORAL | Status: DC
Start: 2016-08-24 — End: 2016-08-25
  Administered 2016-08-24: 20 mg via ORAL
  Filled 2016-08-24 (×3): qty 1

## 2016-08-24 MED ORDER — PRAVASTATIN SODIUM 40 MG PO TABS
40.0000 mg | ORAL_TABLET | Freq: Every day | ORAL | Status: DC
  Administered 2016-08-24: 40 mg via ORAL
  Filled 2016-08-24: qty 1

## 2016-08-24 MED ORDER — ALPRAZOLAM 0.5 MG PO TABS
0.5000 mg | ORAL_TABLET | Freq: Two times a day (BID) | ORAL | Status: DC
  Administered 2016-08-24: 0.5 mg via ORAL
  Filled 2016-08-24: qty 1

## 2016-08-24 MED ORDER — ARTIFICIAL TEARS OPHTHALMIC OINT
TOPICAL_OINTMENT | OPHTHALMIC | Status: AC
  Filled 2016-08-24: qty 3.5

## 2016-08-24 MED ORDER — BISACODYL 10 MG RE SUPP: 10.0000 mg | Freq: Every day | RECTAL | Status: DC | PRN

## 2016-08-24 MED ORDER — PROPOFOL 10 MG/ML IV BOLUS
INTRAVENOUS | Status: DC | PRN
Start: 2016-08-24 — End: 2016-08-24
  Administered 2016-08-24: 150 mg via INTRAVENOUS
  Administered 2016-08-24: 50 mg via INTRAVENOUS

## 2016-08-24 MED ORDER — BACITRACIN ZINC 500 UNIT/GM EX OINT
TOPICAL_OINTMENT | CUTANEOUS | Status: AC
  Filled 2016-08-24: qty 28.35

## 2016-08-24 MED ORDER — ONDANSETRON HCL 4 MG PO TABS: 4.0000 mg | ORAL_TABLET | Freq: Four times a day (QID) | ORAL | Status: DC | PRN

## 2016-08-24 MED ORDER — ACETAMINOPHEN 10 MG/ML IV SOLN
1000.0000 mg | INTRAVENOUS | Status: AC
  Administered 2016-08-24: 1000 mg via INTRAVENOUS
  Filled 2016-08-24: qty 100

## 2016-08-24 MED ORDER — MIDAZOLAM HCL 2 MG/2ML IJ SOLN
INTRAMUSCULAR | Status: AC
  Filled 2016-08-24: qty 2

## 2016-08-24 MED ORDER — MOMETASONE FURO-FORMOTEROL FUM 100-5 MCG/ACT IN AERO
2.0000 | INHALATION_SPRAY | Freq: Two times a day (BID) | RESPIRATORY_TRACT | Status: DC
  Filled 2016-08-24: qty 8.8

## 2016-08-24 MED ORDER — DEXAMETHASONE SODIUM PHOSPHATE 10 MG/ML IJ SOLN
INTRAMUSCULAR | Status: DC | PRN
  Administered 2016-08-24: 10 mg via INTRAVENOUS

## 2016-08-24 MED ORDER — HYPROMELLOSE (GONIOSCOPIC) 2.5 % OP SOLN: 1.0000 [drp] | OPHTHALMIC | Status: DC | PRN

## 2016-08-24 MED ORDER — ROCURONIUM BROMIDE 10 MG/ML (PF) SYRINGE
PREFILLED_SYRINGE | INTRAVENOUS | Status: AC
  Filled 2016-08-24: qty 10

## 2016-08-24 MED ORDER — BUPIVACAINE-EPINEPHRINE (PF) 0.5% -1:200000 IJ SOLN
INTRAMUSCULAR | Status: AC
  Filled 2016-08-24: qty 30

## 2016-08-24 MED ORDER — LACTATED RINGERS IV SOLN
INTRAVENOUS | Status: DC | PRN
  Administered 2016-08-24 (×2): via INTRAVENOUS

## 2016-08-24 MED ORDER — PHENYLEPHRINE 40 MCG/ML (10ML) SYRINGE FOR IV PUSH (FOR BLOOD PRESSURE SUPPORT)
PREFILLED_SYRINGE | INTRAVENOUS | Status: DC | PRN
  Administered 2016-08-24: 80 ug via INTRAVENOUS

## 2016-08-24 MED ORDER — ACETAMINOPHEN 325 MG PO TABS: 650.0000 mg | ORAL_TABLET | ORAL | Status: DC | PRN

## 2016-08-24 MED ORDER — AZELASTINE HCL 0.1 % NA SOLN
1.0000 | Freq: Two times a day (BID) | NASAL | Status: DC
  Filled 2016-08-24: qty 30

## 2016-08-24 MED ORDER — ALBUTEROL SULFATE (2.5 MG/3ML) 0.083% IN NEBU: 2.5000 mg | INHALATION_SOLUTION | Freq: Four times a day (QID) | RESPIRATORY_TRACT | Status: DC | PRN

## 2016-08-24 MED ORDER — PHENOL 1.4 % MT LIQD: 1.0000 | OROMUCOSAL | Status: DC | PRN

## 2016-08-24 MED ORDER — DEXTROSE 5 % IV SOLN
INTRAVENOUS | Status: DC | PRN
  Administered 2016-08-24: 15 ug/min via INTRAVENOUS

## 2016-08-24 MED ORDER — LEVOTHYROXINE SODIUM 75 MCG PO TABS
75.0000 ug | ORAL_TABLET | Freq: Every day | ORAL | Status: DC
  Administered 2016-08-25: 75 ug via ORAL
  Filled 2016-08-24: qty 1

## 2016-08-24 MED ORDER — THROMBIN 5000 UNITS EX SOLR
OROMUCOSAL | Status: DC | PRN
  Administered 2016-08-24: 5 mL via TOPICAL

## 2016-08-24 MED ORDER — BUPIVACAINE-EPINEPHRINE (PF) 0.5% -1:200000 IJ SOLN
INTRAMUSCULAR | Status: DC | PRN
  Administered 2016-08-24: 10 mL

## 2016-08-24 MED ORDER — THROMBIN 20000 UNITS EX SOLR
CUTANEOUS | Status: DC | PRN
  Administered 2016-08-24: 20 mL via TOPICAL

## 2016-08-24 MED ORDER — FUROSEMIDE 20 MG PO TABS
20.0000 mg | ORAL_TABLET | Freq: Every day | ORAL | Status: DC
  Administered 2016-08-24: 20 mg via ORAL
  Filled 2016-08-24: qty 1

## 2016-08-24 MED ORDER — BUPIVACAINE LIPOSOME 1.3 % IJ SUSP
20.0000 mL | INTRAMUSCULAR | Status: AC
  Administered 2016-08-24: 20 mL
  Filled 2016-08-24: qty 20

## 2016-08-24 MED ORDER — VANCOMYCIN HCL 1000 MG IV SOLR
INTRAVENOUS | Status: AC
  Filled 2016-08-24: qty 1000

## 2016-08-24 MED ORDER — ROCURONIUM BROMIDE 10 MG/ML (PF) SYRINGE
PREFILLED_SYRINGE | INTRAVENOUS | Status: DC | PRN
  Administered 2016-08-24: 20 mg via INTRAVENOUS
  Administered 2016-08-24: 60 mg via INTRAVENOUS
  Administered 2016-08-24 (×3): 20 mg via INTRAVENOUS

## 2016-08-24 MED ORDER — DOCUSATE SODIUM 100 MG PO CAPS
100.0000 mg | ORAL_CAPSULE | Freq: Two times a day (BID) | ORAL | Status: DC
  Administered 2016-08-24: 100 mg via ORAL
  Filled 2016-08-24: qty 1

## 2016-08-24 MED ORDER — ESTRADIOL 0.1 MG/GM VA CREA
1.0000 | TOPICAL_CREAM | VAGINAL | Status: DC
  Filled 2016-08-24: qty 42.5

## 2016-08-24 MED ORDER — SODIUM CHLORIDE 0.9% FLUSH
3.0000 mL | Freq: Two times a day (BID) | INTRAVENOUS | Status: DC
  Administered 2016-08-24: 3 mL via INTRAVENOUS

## 2016-08-24 MED ORDER — CEFAZOLIN SODIUM-DEXTROSE 2-4 GM/100ML-% IV SOLN
2.0000 g | Freq: Three times a day (TID) | INTRAVENOUS | Status: AC
  Administered 2016-08-24 – 2016-08-25 (×2): 2 g via INTRAVENOUS
  Filled 2016-08-24 (×2): qty 100

## 2016-08-24 MED ORDER — MECLIZINE HCL 12.5 MG PO TABS: 25.0000 mg | ORAL_TABLET | Freq: Three times a day (TID) | ORAL | Status: DC | PRN

## 2016-08-24 MED ORDER — POTASSIUM CHLORIDE CRYS ER 20 MEQ PO TBCR
20.0000 meq | EXTENDED_RELEASE_TABLET | Freq: Every day | ORAL | Status: DC
  Administered 2016-08-24: 20 meq via ORAL
  Filled 2016-08-24: qty 1

## 2016-08-24 MED ORDER — VANCOMYCIN HCL 1000 MG IV SOLR
INTRAVENOUS | Status: DC | PRN
  Administered 2016-08-24: 1000 mg

## 2016-08-24 MED ORDER — CEFAZOLIN SODIUM-DEXTROSE 2-4 GM/100ML-% IV SOLN: 2.0000 g | INTRAVENOUS | Status: DC

## 2016-08-24 SURGICAL SUPPLY — 63 items
BAG DECANTER FOR FLEXI CONT (MISCELLANEOUS) ×2 IMPLANT
BENZOIN TINCTURE PRP APPL 2/3 (GAUZE/BANDAGES/DRESSINGS) ×2 IMPLANT
BLADE CLIPPER SURG (BLADE) IMPLANT
BUR MATCHSTICK NEURO 3.0 LAGG (BURR) ×2 IMPLANT
BUR PRECISION FLUTE 6.0 (BURR) ×2 IMPLANT
CANISTER SUCT 3000ML PPV (MISCELLANEOUS) ×2 IMPLANT
CAP REVERE LOCKING (Cap) ×8 IMPLANT
CARTRIDGE OIL MAESTRO DRILL (MISCELLANEOUS) ×1 IMPLANT
CLSR STERI-STRIP ANTIMIC 1/2X4 (GAUZE/BANDAGES/DRESSINGS) ×2 IMPLANT
CONT SPEC 4OZ CLIKSEAL STRL BL (MISCELLANEOUS) ×2 IMPLANT
COVER BACK TABLE 60X90IN (DRAPES) ×4 IMPLANT
DIFFUSER DRILL AIR PNEUMATIC (MISCELLANEOUS) ×2 IMPLANT
DRAPE C-ARM 42X72 X-RAY (DRAPES) ×4 IMPLANT
DRAPE HALF SHEET 40X57 (DRAPES) ×2 IMPLANT
DRAPE LAPAROTOMY 100X72X124 (DRAPES) ×2 IMPLANT
DRAPE POUCH INSTRU U-SHP 10X18 (DRAPES) ×2 IMPLANT
DRAPE SURG 17X23 STRL (DRAPES) ×8 IMPLANT
ELECT BLADE 4.0 EZ CLEAN MEGAD (MISCELLANEOUS) ×2
ELECT REM PT RETURN 9FT ADLT (ELECTROSURGICAL) ×2
ELECTRODE BLDE 4.0 EZ CLN MEGD (MISCELLANEOUS) ×1 IMPLANT
ELECTRODE REM PT RTRN 9FT ADLT (ELECTROSURGICAL) ×1 IMPLANT
EVACUATOR 1/8 PVC DRAIN (DRAIN) IMPLANT
GAUZE SPONGE 4X4 12PLY STRL (GAUZE/BANDAGES/DRESSINGS) ×2 IMPLANT
GAUZE SPONGE 4X4 12PLY STRL LF (GAUZE/BANDAGES/DRESSINGS) ×2 IMPLANT
GAUZE SPONGE 4X4 16PLY XRAY LF (GAUZE/BANDAGES/DRESSINGS) IMPLANT
GLOVE BIO SURGEON STRL SZ8 (GLOVE) ×4 IMPLANT
GLOVE BIO SURGEON STRL SZ8.5 (GLOVE) ×4 IMPLANT
GLOVE EXAM NITRILE LRG STRL (GLOVE) IMPLANT
GLOVE EXAM NITRILE XL STR (GLOVE) IMPLANT
GLOVE EXAM NITRILE XS STR PU (GLOVE) IMPLANT
GOWN STRL REUS W/ TWL LRG LVL3 (GOWN DISPOSABLE) ×2 IMPLANT
GOWN STRL REUS W/ TWL XL LVL3 (GOWN DISPOSABLE) ×1 IMPLANT
GOWN STRL REUS W/TWL 2XL LVL3 (GOWN DISPOSABLE) ×2 IMPLANT
GOWN STRL REUS W/TWL LRG LVL3 (GOWN DISPOSABLE) ×2
GOWN STRL REUS W/TWL XL LVL3 (GOWN DISPOSABLE) ×1
KIT BASIN OR (CUSTOM PROCEDURE TRAY) ×2 IMPLANT
KIT ROOM TURNOVER OR (KITS) ×2 IMPLANT
NEEDLE HYPO 21X1.5 SAFETY (NEEDLE) ×2 IMPLANT
NEEDLE HYPO 22GX1.5 SAFETY (NEEDLE) ×2 IMPLANT
NS IRRIG 1000ML POUR BTL (IV SOLUTION) ×2 IMPLANT
OIL CARTRIDGE MAESTRO DRILL (MISCELLANEOUS) ×2
PACK LAMINECTOMY NEURO (CUSTOM PROCEDURE TRAY) ×2 IMPLANT
PAD ARMBOARD 7.5X6 YLW CONV (MISCELLANEOUS) ×10 IMPLANT
PATTIES SURGICAL .5 X1 (DISPOSABLE) IMPLANT
ROD REVERE 6.35 40MM (Rod) ×4 IMPLANT
SCREW REVERE 6.5X50MM (Screw) ×8 IMPLANT
SLEEVE SURGEON STRL (DRAPES) ×2 IMPLANT
SPACER ALTERA 10X31 8-12MM-8 (Spacer) ×2 IMPLANT
SPONGE LAP 4X18 X RAY DECT (DISPOSABLE) IMPLANT
SPONGE NEURO XRAY DETECT 1X3 (DISPOSABLE) IMPLANT
SPONGE SURGIFOAM ABS GEL 100 (HEMOSTASIS) ×2 IMPLANT
STRIP BIOACTIVE 20CC 25X100X8 (Miscellaneous) ×2 IMPLANT
STRIP CLOSURE SKIN 1/2X4 (GAUZE/BANDAGES/DRESSINGS) ×2 IMPLANT
SURGIFLO W/THROMBIN 8M KIT (HEMOSTASIS) ×2 IMPLANT
SUT VIC AB 1 CT1 18XBRD ANBCTR (SUTURE) ×2 IMPLANT
SUT VIC AB 1 CT1 8-18 (SUTURE) ×2
SUT VIC AB 2-0 CP2 18 (SUTURE) ×4 IMPLANT
SYR 30ML LL (SYRINGE) ×2 IMPLANT
TAPE CLOTH SURG 4X10 WHT LF (GAUZE/BANDAGES/DRESSINGS) ×2 IMPLANT
TOWEL GREEN STERILE (TOWEL DISPOSABLE) ×2 IMPLANT
TOWEL GREEN STERILE FF (TOWEL DISPOSABLE) ×2 IMPLANT
TRAY FOLEY W/METER SILVER 16FR (SET/KITS/TRAYS/PACK) ×2 IMPLANT
WATER STERILE IRR 1000ML POUR (IV SOLUTION) ×2 IMPLANT

## 2016-08-24 NOTE — Anesthesia Preprocedure Evaluation (Addendum)
Anesthesia Evaluation  Patient identified by MRN, date of birth, ID band Patient awake    Reviewed: Allergy & Precautions, NPO status , Patient's Chart, lab work & pertinent test results  Airway Mallampati: II  TM Distance: >3 FB Neck ROM: Full    Dental no notable dental hx.    Pulmonary neg pulmonary ROS, former smoker,    Pulmonary exam normal breath sounds clear to auscultation       Cardiovascular hypertension, Normal cardiovascular exam Rhythm:Regular Rate:Normal  Untreated HTN   Neuro/Psych Depression negative neurological ROS     GI/Hepatic Neg liver ROS, GERD  ,  Endo/Other  Hypothyroidism Morbid obesity  Renal/GU negative Renal ROS  negative genitourinary   Musculoskeletal negative musculoskeletal ROS (+)   Abdominal   Peds negative pediatric ROS (+)  Hematology negative hematology ROS (+)   Anesthesia Other Findings   Reproductive/Obstetrics negative OB ROS                            Anesthesia Physical Anesthesia Plan  ASA: III  Anesthesia Plan: General   Post-op Pain Management:    Induction: Intravenous  PONV Risk Score and Plan: 2 and Ondansetron, Dexamethasone and Treatment may vary due to age or medical condition  Airway Management Planned: Oral ETT  Additional Equipment: Arterial line  Intra-op Plan:   Post-operative Plan: Extubation in OR  Informed Consent: I have reviewed the patients History and Physical, chart, labs and discussed the procedure including the risks, benefits and alternatives for the proposed anesthesia with the patient or authorized representative who has indicated his/her understanding and acceptance.   Dental advisory given  Plan Discussed with: CRNA and Surgeon  Anesthesia Plan Comments:        Anesthesia Quick Evaluation

## 2016-08-24 NOTE — Op Note (Signed)
Brief history: The patient is a 76 year old white female who has complained of back, buttock and leg pain consistent with neurogenic claudication. She has failed medical management and was worked up with a lumbar myelo CT. This demonstrated she had multilevel degenerative changes, the worse of which was at L4-5 where she had severe stenosis and a spondylolisthesis. I discussed the various treatment option with the patient including surgery. She has weighed the risks, benefits, and alternatives to surgery and decided proceed with an L4-5 decompression, instrumentation, and fusion.  Preoperative diagnosis: L4-5 spondylolisthesis, Degenerative disc disease, spinal stenosis compressing both the L4 and the L5 nerve roots; lumbago; lumbar radiculopathy; neurogenic claudication  Postoperative diagnosis: The same  Procedure: Bilateral L4-5 Laminotomy/foraminotomies to decompress the bilateral L4 and L5 nerve roots(the work required to do this was in addition to the work required to do the posterior lumbar interbody fusion because of the patient's spinal stenosis, facet arthropathy. Etc. requiring a wide decompression of the nerve roots.); L4-5 transforaminal lumbar interbody fusion with local morselized autograft bone and Kinnex graft extender; insertion of interbody prosthesis at L4-5 (globus peek expandable interbody prosthesis); posterior nonsegmental instrumentation from L4 to L5 with globus titanium pedicle screws and rods; posterior lateral arthrodesis at L4-5 with local morselized autograft bone and Kinnex bone graft extender.  Surgeon: Dr. Earle Gell  Asst.: Dr. Cyndy Freeze  Anesthesia: Gen. endotracheal  Estimated blood loss: 200 mL  Drains: None  Complications: None  Description of procedure: The patient was brought to the operating room by the anesthesia team. General endotracheal anesthesia was induced. The patient was turned to the prone position on the Wilson frame. The patient's lumbosacral  region was then prepared with Betadine scrub and Betadine solution. Sterile drapes were applied.  I then injected the area to be incised with Marcaine with epinephrine solution. I then used the scalpel to make a linear midline incision over the L4-5 interspace. I then used electrocautery to perform a bilateral subperiosteal dissection exposing the spinous process and lamina of L4 and L5. We then obtained intraoperative radiograph to confirm our location. We then inserted the Verstrac retractor to provide exposure.  I began the decompression by using the high speed drill to perform laminotomies at L4-5 bilaterally. We then used the Kerrison punches to widen the laminotomy and removed the ligamentum flavum at L4-5 bilaterally. We used the Kerrison punches to remove the medial facets at L4-5. We performed wide foraminotomies about the bilateral L4 and L5 nerve roots completing the decompression.  We now turned our attention to the posterior lumbar interbody fusion. I used a scalpel to incise the intervertebral disc at L4-5 bilaterally. I then performed a partial intervertebral discectomy at L4-5 bilaterally using the pituitary forceps. We prepared the vertebral endplates at D9-7 bilaterally for the fusion by removing the soft tissues with the curettes. We then used the trial spacers to pick the appropriate sized interbody prosthesis. We prefilled his prosthesis with a combination of local morselized autograft bone that we obtained during the decompression as well as Kinnex bone graft extender. We inserted the prefilled prosthesis into the interspace at L4-5, we then expanded the prosthesis. There was a good snug fit of the prosthesis in the interspace. We then filled and the remainder of the intervertebral disc space with local morselized autograft bone and Kinnex. This completed the posterior lumbar interbody arthrodesis.  We now turned attention to the instrumentation. Under fluoroscopic guidance we  cannulated the bilateral L4 and L5 pedicles with the bone probe. We then  removed the bone probe. We then tapped the pedicle with a 5.5 millimeter tap. We then removed the tap. We probed inside the tapped pedicle with a ball probe to rule out cortical breaches. We then inserted a 6.5 x 50 millimeter pedicle screw into the L4 and L5 pedicles bilaterally under fluoroscopic guidance. We then palpated along the medial aspect of the pedicles to rule out cortical breaches. There were none. The nerve roots were not injured. We then connected the unilateral pedicle screws with a lordotic rod. We compressed the construct and secured the rod in place with the caps. We then tightened the caps appropriately. This completed the instrumentation from L4-5.  We now turned our attention to the posterior lateral arthrodesis at L4-5 bilaterally. We used the high-speed drill to decorticate the remainder of the facets, pars, transverse process at L4-5 bilaterally. We then applied a combination of local morselized autograft bone and Kinnex bone graft extender over these decorticated posterior lateral structures. This completed the posterior lateral arthrodesis.  We then obtained hemostasis using bipolar electrocautery. We irrigated the wound out with bacitracin solution. We inspected the thecal sac and nerve roots and noted they were well decompressed. We then removed the retractor. We placed vancomycin powder in the wound. We reapproximated patient's thoracolumbar fascia with interrupted #1 Vicryl suture. We reapproximated patient's subcutaneous tissue with interrupted 2-0 Vicryl suture. The reapproximated patient's skin with Steri-Strips and benzoin. The wound was then coated with bacitracin ointment. A sterile dressing was applied. The drapes were removed. The patient was subsequently returned to the supine position where they were extubated by the anesthesia team. He was then transported to the post anesthesia care unit in stable  condition. All sponge instrument and needle counts were reportedly correct at the end of this case.

## 2016-08-24 NOTE — Progress Notes (Signed)
Patient ID: Sheila [name], female   DOB: 1940/10/20, 76 y.o.   MRN: [id number] Subjective:  The patient is somnolent but arousable. She is in no apparent distress.  Objective: Vital signs in last 24 hours: Temp:  [97.8 F (36.6 C)-98.6 F (37 C)] 97.8 F (36.6 C) (07/02 1452) Pulse Rate:  [73-113] 109 (07/02 1507) Resp:  [14-22] 14 (07/02 1507) BP: (107-230)/(47-97) 107/57 (07/02 1507) SpO2:  [95 %-99 %] 95 % (07/02 1507) Arterial Line BP: (122)/(73) 122/73 (07/02 1507) Weight:  [85.7 kg (189 lb)] 85.7 kg (189 lb) (07/02 1044)  Intake/Output from previous day: No intake/output data recorded. Intake/Output this shift: Total I/O In: 1400 [I.V.:1400] Out: 1450 [Urine:1300; Blood:150]  Physical exam the patient is somnolent but arousable. She is moving her lower extremities well.  Lab Results: No results for input(s): WBC, HGB, HCT, PLT in the last 72 hours. BMET No results for input(s): NA, K, CL, CO2, GLUCOSE, BUN, CREATININE, CALCIUM in the last 72 hours.  Studies/Results: Dg Lumbar Spine 2-3 Views  Result Date: 08/24/2016 CLINICAL DATA:  L4-5 fusion surgery. EXAM: DG C-ARM 61-120 MIN; LUMBAR SPINE - 2-3 VIEW COMPARISON:  Intraoperative lumbar spine radiographs from earlier today. FINDINGS: Fluoroscopy time 0 minutes 30 seconds. Two nondiagnostic spot fluoroscopic intraoperative lumbar spine radiographs demonstrate postsurgical changes from bilateral posterior spinal fusion at L4-5 with bone cage in the L4-5 disc space. IMPRESSION: Intraoperative fluoroscopic guidance for posterior spinal fusion at L4-5. Electronically Signed   By: Ilona Sorrel M.D.   On: 08/24/2016 14:27   Dg Lumbar Spine 1 View  Result Date: 08/24/2016 CLINICAL DATA:  Intraoperative localization at L4-5 EXAM: LUMBAR SPINE - 1 VIEW COMPARISON:  06/25/2016 FINDINGS: Anterolisthesis of L4 on L5 is again noted. Surgical retractors in instruments are noted posterior to the L5 posterior elements. Numbering nomenclature  similar to that utilized on prior CT. IMPRESSION: Intraoperative localization at L4-5. Electronically Signed   By: Inez Catalina M.D.   On: 08/24/2016 14:10   Dg C-arm 1-60 Min  Result Date: 08/24/2016 CLINICAL DATA:  L4-5 fusion surgery. EXAM: DG C-ARM 61-120 MIN; LUMBAR SPINE - 2-3 VIEW COMPARISON:  Intraoperative lumbar spine radiographs from earlier today. FINDINGS: Fluoroscopy time 0 minutes 30 seconds. Two nondiagnostic spot fluoroscopic intraoperative lumbar spine radiographs demonstrate postsurgical changes from bilateral posterior spinal fusion at L4-5 with bone cage in the L4-5 disc space. IMPRESSION: Intraoperative fluoroscopic guidance for posterior spinal fusion at L4-5. Electronically Signed   By: Ilona Sorrel M.D.   On: 08/24/2016 14:27    Assessment/Plan: The patient is doing well. I spoke with the family.  LOS: 0 days     Milta Croson D 08/24/2016, 3:37 PM

## 2016-08-24 NOTE — H&P (Signed)
Subjective: The patient is a 76 year old white female who has complained of back and leg pain consistent with neurogenic claudication. She has failed medical management and was worked up with a lumbar myelo CT. This demonstrated severe stenosis and a spondylolisthesis at L4-5. I discussed the various treatment options with the patient including surgery. She has weighed the risks, benefits, and alternatives to surgery and decided proceed with a L4-5 decompression, instrumentation, and fusion.   Past Medical History:  Diagnosis Date  . Allergy   . Bell's palsy   . Cancer ([place])    skin CA on face  . Cataract   . Chest pain 07/2009   Myoveiw stress test negative.  . Depression   . Diverticulosis of colon   . DJD (degenerative joint disease) of knee   . Dyspnea    on exertion  . Family history of adverse reaction to anesthesia    sister ha nausea/vomiting  . Gastric ulcer with hemorrhage 01/14/2011  . GERD (gastroesophageal reflux disease)   . Headache   . History of fractured pelvis   . Hypercholesteremia   . Hypertension   . Hypothyroidism   . UTI (lower urinary tract infection)     Past Surgical History:  Procedure Laterality Date  . APPENDECTOMY    . BREAST BIOPSY Left   . ESOPHAGOGASTRODUODENOSCOPY  01/14/2011   Procedure: ESOPHAGOGASTRODUODENOSCOPY (EGD);  Surgeon: Rogene Houston, MD;  Location: [facility];  Service: Endoscopy;  Laterality: N/A;  . HIP SURGERY     pelvis  . JOINT REPLACEMENT    . KNEE SURGERY  04/2010.   Total left knee replacement  . LAPAROSCOPIC APPENDECTOMY N/A 08/01/2013   Procedure: APPENDECTOMY LAPAROSCOPIC;  Surgeon: Jamesetta So, MD;  Location: [facility] ORS;  Service: General;  Laterality: N/A;    Allergies  Allergen Reactions  . Ibuprofen     History of bleeding ulcers - contra-indicated   . Benadryl [Diphenhydramine Hcl] Other (See Comments)    Worsening depression   . Lipitor [Atorvastatin] Other (See Comments)    Body aches.  . Codeine  Nausea And Vomiting  . Morphine And Related Nausea And Vomiting  . Tdap [Diphth-Acell Pertussis-Tetanus] Swelling    Redness and localized swelling at injection site     Social History  Substance Use Topics  . Smoking status: Former Smoker    Types: Cigarettes    Quit date: 02/24/1968  . Smokeless tobacco: Current User    Types: Snuff     Comment: quit yrs and yrs ago when she was very young  . Alcohol use No    Family History  Problem Relation Age of Onset  . Aneurysm Mother   . Stroke Mother   . Hypertension Mother   . Cancer Father        lung  . Heart disease Sister   . Hip fracture Sister   . Cancer Brother   . Alzheimer's disease Sister   . Cancer Sister        skin, uterine  . Cancer Brother    Prior to Admission medications   Medication Sig Start Date End Date Taking? Authorizing Provider  albuterol (PROVENTIL HFA;VENTOLIN HFA) 108 (90 Base) MCG/ACT inhaler Inhale 2 puffs into the lungs every 6 (six) hours as needed for wheezing. 03/04/15  Yes Hawks, Christy A, FNP  albuterol (PROVENTIL) (2.5 MG/3ML) 0.083% nebulizer solution Nebulize 1 vial every 6-8 hours as needed for wheezing or shortness of breath. Patient taking differently: Take 2.5 mg by nebulization every  6 (six) hours as needed for wheezing or shortness of breath. Nebulize 1 vial every 6-8 hours as needed for wheezing or shortness of breath. 04/15/15  Yes Hawks, Alyse Low A, FNP  ALPRAZolam Duanne Moron) 0.5 MG tablet TAKE  (1)  TABLET TWICE A DAY. 05/05/16  Yes Hawks, Christy A, FNP  azelastine (ASTELIN) 0.1 % nasal spray Place 1 spray into both nostrils 2 (two) times daily. Use in each nostril as directed   Yes [provider]  citalopram (CELEXA) 20 MG tablet TAKE 1 TABLET DAILY 08/03/16  Yes Hawks, Christy A, FNP  clotrimazole-betamethasone (LOTRISONE) cream Apply topically 2 (two) times daily. 08/20/16  Yes [name] A, FNP  estradiol (ESTRACE) 0.1 MG/GM vaginal cream Place 1 Applicatorful vaginally every  Monday, Wednesday, and Friday. Reported on 08/07/2015   Yes [provider]  fluticasone (FLONASE) 50 MCG/ACT nasal spray USE 1 TO 2 SPRAYS IN EACH NOSTRIL AT BEDTIME 11/19/14  Yes Hassell Done, Mary-Margaret, FNP  furosemide (LASIX) 20 MG tablet TAKE 1 TABLET DAILY 08/07/16  Yes Hassell Done, Mary-Margaret, FNP  gabapentin (NEURONTIN) 300 MG capsule TAKE (1) CAPSULE THREE TIMES DAILY. 06/04/16  Yes Hawks, Christy A, FNP  hydroxypropyl methylcellulose / hypromellose (ISOPTO TEARS / GONIOVISC) 2.5 % ophthalmic solution Place 1 drop into both eyes as needed for dry eyes.   Yes [provider]  levothyroxine (SYNTHROID, LEVOTHROID) 75 MCG tablet TAKE 1 TABLET DAILY 09/09/15  Yes Hawks, Christy A, FNP  meclizine (ANTIVERT) 25 MG tablet Take 25 mg by mouth 3 (three) times daily as needed for dizziness.   Yes [provider]  meloxicam (MOBIC) 15 MG tablet Take 1 tablet (15 mg total) by mouth daily. 07/28/16  Yes Hawks, Christy A, FNP  Multiple Vitamins-Minerals (CENTRUM SILVER ADULT 50+ PO) Take 1 tablet by mouth every other day.   Yes [provider]  omeprazole (PRILOSEC) 40 MG capsule Take 40 mg by mouth daily.   Yes [provider]  pantoprazole (PROTONIX) 20 MG tablet Take 1 tablet (20 mg total) by mouth daily. 06/02/16  Yes Setzer, Rona Ravens, NP  Pitavastatin Calcium (LIVALO) 2 MG TABS Take 1 tablet (2 mg total) by mouth daily. 06/08/16  Yes Hawks, Christy A, FNP  potassium chloride SA (K-DUR,KLOR-CON) 20 MEQ tablet TAKE  (1)  TABLET TWICE A DAY. 05/05/16  Yes Hawks, Christy A, FNP  SYMBICORT 80-4.5 MCG/ACT inhaler Inhale 1 puff into the lungs 2 (two) times daily. 08/03/16  Yes [provider]  denosumab (PROLIA) 60 MG/ML SOLN injection Inject 60 mg into the skin every 6 (six) months. Bring to office for administration. 06/12/16   Sharion Balloon, FNP     Review of Systems  Positive ROS: As above  All other systems have been reviewed and were otherwise negative with  the exception of those mentioned in the HPI and as above.  Objective: Vital signs in last 24 hours: Temp:  [98.6 F (37 C)] 98.6 F (37 C) (07/02 1031) Pulse Rate:  [73] 73 (07/02 1031) Resp:  [20] 20 (07/02 1031) BP: (201-230)/(89-97) 201/97 (07/02 1044) SpO2:  [99 %] 99 % (07/02 1031) Weight:  [85.7 kg (189 lb)] 85.7 kg (189 lb) (07/02 1044)  General Appearance: Alert, obese Head: Normocephalic, without obvious abnormality, atraumatic Eyes: PERRL, conjunctiva/corneas clear, EOM's intact,    Ears: Normal  Throat: Normal  Neck: Supple, Back: unremarkable, obese Lungs: Clear to auscultation bilaterally, respirations unlabored Heart: Regular rate and rhythm, no murmur, rub or gallop Abdomen: Soft, non-tender, obese, she  has a small lesion in her left groin. Extremities: Extremities normal, atraumatic, no cyanosis or edema Skin: unremarkable, except as above  NEUROLOGIC:   Mental status: alert and oriented,Motor Exam - grossly normal Sensory Exam - grossly normal Reflexes:  Coordination - grossly normal Gait - grossly normal Balance - grossly normal Cranial Nerves: I: smell Not tested  II: visual acuity  OS: Normal  OD: Normal   II: visual fields Full to confrontation  II: pupils Equal, round, reactive to light  III,VII: ptosis None  III,IV,VI: extraocular muscles  Full ROM  V: mastication Normal  V: facial light touch sensation  Normal  V,VII: corneal reflex  Present  VII: facial muscle function - upper  Normal  VII: facial muscle function - lower Normal  VIII: hearing Not tested  IX: soft palate elevation  Normal  IX,X: gag reflex Present  XI: trapezius strength  5/5  XI: sternocleidomastoid strength 5/5  XI: neck flexion strength  5/5  XII: tongue strength  Normal    Data Review Lab Results  Component Value Date   WBC 10.9 (H) 08/14/2016   HGB 11.9 (L) 08/14/2016   HCT 35.4 (L) 08/14/2016   MCV 87.4 08/14/2016   PLT 285 08/14/2016   Lab Results   Component Value Date   NA 143 08/13/2016   K 4.4 08/13/2016   CL 106 08/13/2016   CO2 20 08/13/2016   BUN 7 (L) 08/13/2016   CREATININE 0.65 08/13/2016   GLUCOSE 84 08/13/2016   Lab Results  Component Value Date   INR 1.00 02/09/2011    Assessment/Plan: L4-5 spondylolisthesis, spinal stenosis, neurogenic claudication, lumbago, lumbar radiculopathy: I have discussed the situation with the patient. I have reviewed her imaging studies with her and pointed out the abnormalities. We have discussed the various treatment options including surgery. I have described the surgical treatment option of an L4-5 decompression, instrumentation, and fusion. I have shown her surgical models. We have discussed the risks, benefits, alternatives, expected Postoperative course, and likelihood of achieving her goals with surgery. I have answered all her questions. She has decided to proceed with surgery.   Morton Simson D 08/24/2016 11:07 AM

## 2016-08-24 NOTE — Anesthesia Procedure Notes (Signed)
Arterial Line Insertion Start/End7/03/2016 11:00 AM Performed by: Mervyn Gay, CRNA  Patient location: Pre-op. Preanesthetic checklist: patient identified, IV checked, site marked, risks and benefits discussed, surgical consent, monitors and equipment checked, pre-op evaluation, timeout performed and anesthesia consent Lidocaine 1% used for infiltration Right, radial was placed Catheter size: 20 G Hand hygiene performed , maximum sterile barriers used  and Seldinger technique used Allen's test indicative of satisfactory collateral circulation Attempts: 1 Following insertion, dressing applied and Biopatch. Post procedure assessment: normal  Patient tolerated the procedure well with no immediate complications.

## 2016-08-24 NOTE — Transfer of Care (Signed)
Immediate Anesthesia Transfer of Care Note  Patient: Sheila [name]  Procedure(s) Performed: Procedure(s): POSTERIOR LUMBAR INTERBODY FUSION, INTERBODY PROSTHESIS, POSTERIO LATERAL ARTHRODESIS, POSTERIOR NONSEGMENTAL INSTRUMENTATION LUMBAR FOUR- LUMBAR FIVE (N/A)  Patient Location: PACU  Anesthesia Type:General  Level of Consciousness: awake, alert , oriented and patient cooperative  Airway & Oxygen Therapy: Patient Spontanous Breathing and Patient connected to face mask oxygen  Post-op Assessment: Report given to RN, Post -op Vital signs reviewed and stable and Patient moving all extremities X 4  Post vital signs: Reviewed and stable  Last Vitals:  Vitals:   08/24/16 1044 08/24/16 1452  BP: (!) 201/97   Pulse:    Resp:    Temp:  (P) 36.6 C    Last Pain:  Vitals:   08/24/16 1031  TempSrc: Oral      Patients Stated Pain Goal: 5 (09/98/33 8250)  Complications: No apparent anesthesia complications

## 2016-08-24 NOTE — Anesthesia Postprocedure Evaluation (Signed)
Anesthesia Post Note  Patient: Sheila [name]  Procedure(s) Performed: Procedure(s) (LRB): POSTERIOR LUMBAR INTERBODY FUSION, INTERBODY PROSTHESIS, POSTERIO LATERAL ARTHRODESIS, POSTERIOR NONSEGMENTAL INSTRUMENTATION LUMBAR FOUR- LUMBAR FIVE (N/A)     Patient location during evaluation: PACU Anesthesia Type: General Level of consciousness: awake and alert Pain management: pain level controlled Vital Signs Assessment: post-procedure vital signs reviewed and stable Respiratory status: spontaneous breathing, nonlabored ventilation, respiratory function stable and patient connected to nasal cannula oxygen Cardiovascular status: blood pressure returned to baseline and stable Postop Assessment: no signs of nausea or vomiting Anesthetic complications: no Comments: Slightly tachycardic in pacu, no BP changes, no change in mentation, patient claims her heart rate can go up and down regardless of activity.    Last Vitals:  Vitals:   08/24/16 1522 08/24/16 1537  BP: (!) 108/51 (!) 116/58  Pulse: (!) 107 (!) 113  Resp: 13 16  Temp:      Last Pain:  Vitals:   08/24/16 1550  TempSrc:   PainSc: 0-No pain                 Jacinto Keil S

## 2016-08-24 NOTE — Anesthesia Procedure Notes (Signed)
Procedure Name: Intubation Date/Time: 08/24/2016 11:30 AM Performed by: Mervyn Gay Pre-anesthesia Checklist: Patient identified, Patient being monitored, Timeout performed, Emergency Drugs available and Suction available Patient Re-evaluated:Patient Re-evaluated prior to inductionOxygen Delivery Method: Circle System Utilized Preoxygenation: Pre-oxygenation with 100% oxygen Intubation Type: IV induction Ventilation: Mask ventilation without difficulty and Oral airway inserted - appropriate to patient size Laryngoscope Size: Miller and 3 Grade View: Grade I Tube type: Oral Tube size: 7.5 mm Number of attempts: 1 Airway Equipment and Method: Stylet Placement Confirmation: ETT inserted through vocal cords under direct vision,  positive ETCO2 and breath sounds checked- equal and bilateral Secured at: 21 cm Tube secured with: Tape Dental Injury: Teeth and Oropharynx as per pre-operative assessment

## 2016-08-25 LAB — BASIC METABOLIC PANEL
Anion gap: 7 (ref 5–15)
BUN: 16 mg/dL (ref 6–20)
CO2: 27 mmol/L (ref 22–32)
Calcium: 9.7 mg/dL (ref 8.9–10.3)
Chloride: 99 mmol/L — ABNORMAL LOW (ref 101–111)
Creatinine, Ser: 0.96 mg/dL (ref 0.44–1.00)
GFR calc Af Amer: >60 mL/min (ref >60)
GFR calc non Af Amer: 56 mL/min — ABNORMAL LOW (ref >60)
Glucose, Bld: 122 mg/dL — ABNORMAL HIGH (ref 65–99)
Potassium: 4 mmol/L (ref 3.5–5.1)
Sodium: 133 mmol/L — ABNORMAL LOW (ref 135–145)

## 2016-08-25 LAB — CBC
HCT: 29.9 % — ABNORMAL LOW (ref 36.0–46.0)
Hemoglobin: 9.6 g/dL — ABNORMAL LOW (ref 12.0–15.0)
MCH: 28.2 pg (ref 26.0–34.0)
MCHC: 32.1 g/dL (ref 30.0–36.0)
MCV: 87.9 fL (ref 78.0–100.0)
Platelets: 252 10*3/uL (ref 150–400)
RBC: 3.4 MIL/uL — ABNORMAL LOW (ref 3.87–5.11)
RDW: 13.8 % (ref 11.5–15.5)
WBC: 14 10*3/uL — ABNORMAL HIGH (ref 4.0–10.5)

## 2016-08-25 MED ORDER — HYDROMORPHONE HCL 2 MG PO TABS: 2.0000 mg | ORAL_TABLET | ORAL | 0 refills | Status: DC | PRN

## 2016-08-25 MED ORDER — HYDROMORPHONE HCL 2 MG PO TABS: 2.0000 mg | ORAL_TABLET | ORAL | Status: DC | PRN

## 2016-08-25 MED ORDER — DOCUSATE SODIUM 100 MG PO CAPS: 100.0000 mg | ORAL_CAPSULE | Freq: Two times a day (BID) | ORAL | 0 refills | Status: DC

## 2016-08-25 MED FILL — Heparin Sodium (Porcine) Inj 1000 Unit/ML: INTRAMUSCULAR | Qty: 30 | Status: AC

## 2016-08-25 MED FILL — Sodium Chloride IV Soln 0.9%: INTRAVENOUS | Qty: 1000 | Status: AC

## 2016-08-25 NOTE — Discharge Instructions (Signed)

## 2016-08-25 NOTE — Care Management Note (Signed)
Case Management Note  Patient Details  Name: Sheila [name] MRN: [id number] Date of Birth: January 09, 1941  Subjective/Objective:   76 yr old female s/p L4-5 decompression and fusion                 Action/Plan: Case manager spoke with patient concerning discharge plan and DME needs. Choice was offered for home health agency. Patient says she has used [facility] in the past and wants to do so now. Referral was called to Stevie Kern, [facility] Liaison.  She has RW and 3in1 at home, will have family support at discharge.    Expected Discharge Date:  08/25/16               Expected Discharge Plan:  [place]  In-House Referral:  NA  Discharge planning Services  CM Consult  Post Acute Care Choice:  Home Health Choice offered to:  Patient, Adult Children  DME Arranged:  (S) N/A (has RW and 3in1) DME Agency:  NA  HH Arranged:  PT [place] Agency:  [place]  Status of Service:  Completed, signed off  If discussed at [place] of Stay Meetings, dates discussed:    Additional Comments:  Ninfa Meeker, RN 08/25/2016, 11:46 AM

## 2016-08-25 NOTE — Evaluation (Signed)
Physical Therapy Evaluation Patient Details Name: Sheila [name] MRN: [id number] DOB: Mar 10, 1940 Today's Date: 08/25/2016   History of Present Illness  Admitted with L4-5 spondylolisthesis, stenosis, radiculopathy; s/p decompression and fusion;  has a pertinent  past medical history of Allergy; Bell's palsy; Cancer ([place]); ; Diverticulosis of colon; DJD (degenerative joint disease) of knee;  History of fractured pelvis;  and UTI (lower urinary tract infection).  Clinical Impression   Patient is s/p above surgery resulting in functional limitations due to the deficits listed below (see PT Problem List). Overall walking well; needing cues and close guard for bed mobility and transfers; will have assist form Aide 10-4 weekdays; Family will be in town to assist when Aide isn't there;  Patient will benefit from skilled PT to increase their independence and safety with mobility to allow discharge to the venue listed below.       Follow Up Recommendations Home health PT;Supervision/Assistance - 24 hour    Equipment Recommendations  None recommended by PT    Recommendations for Other Services       Precautions / Restrictions Precautions Precautions: Back Precaution Booklet Issued: Yes (comment) Required Braces or Orthoses: Spinal Brace Spinal Brace: Applied in sitting position Restrictions Weight Bearing Restrictions: No      Mobility  Bed Mobility Overal bed mobility: Needs Assistance Bed Mobility: Rolling;Sidelying to Sit Rolling: Min guard Sidelying to sit: Min guard       General bed mobility comments: Requires cues and minguard to assure no twisting during log roll; min assist to push sidelie to sit  Transfers Overall transfer level: Needs assistance Equipment used: Rolling walker (2 wheeled) Transfers: Sit to/from Stand Sit to Stand: Min guard         General transfer comment: Cues for posture, hand placement, control, safety and  precautions  Ambulation/Gait Ambulation/Gait assistance: Min guard (with and without physical contact) Ambulation Distance (Feet): 100 Feet (greater tahn) Assistive device: Rolling walker (2 wheeled) Gait Pattern/deviations: Step-through pattern;Decreased step length - right;Decreased step length - left     General Gait Details: Cues to self-monitor for activity tolerance  Stairs            Wheelchair Mobility    Modified Rankin (Stroke Patients Only)       Balance                                             Pertinent Vitals/Pain Pain Assessment: 0-10 Pain Score: 6  Pain Location: Back Pain Descriptors / Indicators: Sore Pain Intervention(s): Monitored during session    Home Living Family/patient expects to be discharged to:: Private residence Living Arrangements: Alone Available Help at Discharge: Family;Personal care attendant Type of Home: Apartment Home Access: Level entry     Home Layout: One level Home Equipment: Environmental consultant - 4 wheels      Prior Function Level of Independence: Needs assistance   Gait / Transfers Assistance Needed: with 4 wheeled RW  ADL's / Homemaking Assistance Needed: assist from Aide for bathing        Hand Dominance        Extremity/Trunk Assessment   Upper Extremity Assessment Upper Extremity Assessment: Generalized weakness    Lower Extremity Assessment Lower Extremity Assessment: Generalized weakness       Communication   Communication: No difficulties  Cognition Arousal/Alertness: Awake/alert Behavior During Therapy: WFL for tasks assessed/performed Overall Cognitive Status:  Within Functional Limits for tasks assessed                                        General Comments General comments (skin integrity, edema, etc.): Brace had not arrived yet, so kept close watch for precautions; Discussed with RN who told me she had been up and walking already    Exercises      Assessment/Plan    PT Assessment Patient needs continued PT services  PT Problem List Decreased strength;Decreased activity tolerance;Decreased mobility;Decreased knowledge of use of DME;Decreased knowledge of precautions;Pain       PT Treatment Interventions DME instruction;Gait training;Stair training;Functional mobility training;Therapeutic activities;Therapeutic exercise;Patient/family education    PT Goals (Current goals can be found in the Care Plan section)  Acute Rehab PT Goals Patient Stated Goal: decr pain PT Goal Formulation: With patient Time For Goal Achievement: 09/01/16 Potential to Achieve Goals: Good    Frequency Min 5X/week   Barriers to discharge        Co-evaluation               AM-PAC PT "6 Clicks" Daily Activity  Outcome Measure Difficulty turning over in bed (including adjusting bedclothes, sheets and blankets)?: Total Difficulty moving from lying on back to sitting on the side of the bed? : A Little Difficulty sitting down on and standing up from a chair with arms (e.g., wheelchair, bedside commode, etc,.)?: A Little Help needed moving to and from a bed to chair (including a wheelchair)?: A Little Help needed walking in hospital room?: A Little Help needed climbing 3-5 steps with a railing? : A Little 6 Click Score: 16    End of Session Equipment Utilized During Treatment: Gait belt Activity Tolerance: Patient tolerated treatment well Patient left: in chair;with call bell/phone within reach;with family/visitor present Nurse Communication: Mobility status PT Visit Diagnosis: Other abnormalities of gait and mobility (R26.89);Pain Pain - part of body:  (Back)    Time: 4580-9983 PT Time Calculation (min) (ACUTE ONLY): 31 min   Charges:   PT Evaluation $PT Eval Low Complexity: 1 Procedure PT Treatments $Gait Training: 8-22 mins   PT G Codes:        Roney Marion, PT  Acute Rehabilitation Services Pager [phone] Office  [phone]   Colletta Maryland 08/25/2016, 11:25 AM

## 2016-08-25 NOTE — Progress Notes (Signed)
Pt doing well. Pt and daughter given D/C instructions with Rx, verbal understanding was provided. Pt's incision has scant amount of drainage. Pt's IV was removed prior to D/C. Pt D/C'd home via wheelchair @ 1130 per MD order. Pt's Home Health was arranged by CM prior to D/C. Pt is stable @ D/C and has no other needs at this time. Holli Humbles, RN

## 2016-08-25 NOTE — Discharge Summary (Signed)
Physician Discharge Summary  Patient ID: Sheila [name] MRN: [id number] DOB/AGE: 1940-04-14 76 y.o.  Admit date: 08/24/2016 Discharge date: 08/25/2016  Admission Diagnoses:L4-5 spondylolisthesis, spinal stenosis, urgent claudication, lumbar radiculopathy, lumbago  Discharge Diagnoses: The same Active Problems:   Spondylolisthesis of lumbar region   Discharged Condition: good  Hospital Course: I performed an L4-5 decompression, instrumentation, and fusion on the patient on 08/24/16. The surgery went well.  The patient's postoperative course was unremarkable. She will likely go home later on today. The patient and her daughter were given written and oral discharge instructions. All her questions were answered.  Consults: Physical therapy Significant Diagnostic Studies: None Treatments: L4-5 decompression, instrumentation, and fusion. Discharge Exam: Blood pressure (!) 144/73, pulse 80, temperature 98.6 F (37 C), temperature source Oral, resp. rate 18, height 4\' 11"  (1.499 m), weight 85.7 kg (189 lb), SpO2 97 %. The patient is alert and pleasant. Her strength is grossly normal lower extremities.  Disposition: Home   Allergies as of 08/25/2016      Reactions   Ibuprofen    History of bleeding ulcers - contra-indicated   Benadryl [diphenhydramine Hcl] Other (See Comments)   Worsening depression   Lipitor [atorvastatin] Other (See Comments)   Body aches.   Codeine Nausea And Vomiting   Morphine And Related Nausea And Vomiting   Tdap [diphth-acell Pertussis-tetanus] Swelling   Redness and localized swelling at injection site      Medication List    STOP taking these medications   meloxicam 15 MG tablet Commonly known as:  MOBIC     TAKE these medications   albuterol 108 (90 Base) MCG/ACT inhaler Commonly known as:  PROVENTIL HFA;VENTOLIN HFA Inhale 2 puffs into the lungs every 6 (six) hours as needed for wheezing. What changed:  Another medication with the same name was  changed. Make sure you understand how and when to take each.   albuterol (2.5 MG/3ML) 0.083% nebulizer solution Commonly known as:  PROVENTIL Nebulize 1 vial every 6-8 hours as needed for wheezing or shortness of breath. What changed:  how much to take  how to take this  when to take this  reasons to take this  additional instructions   ALPRAZolam 0.5 MG tablet Commonly known as:  XANAX TAKE  (1)  TABLET TWICE A DAY.   azelastine 0.1 % nasal spray Commonly known as:  ASTELIN Place 1 spray into both nostrils 2 (two) times daily. Use in each nostril as directed   CENTRUM SILVER ADULT 50+ PO Take 1 tablet by mouth every other day.   citalopram 20 MG tablet Commonly known as:  CELEXA TAKE 1 TABLET DAILY   clotrimazole-betamethasone cream Commonly known as:  LOTRISONE Apply topically 2 (two) times daily.   denosumab 60 MG/ML Soln injection Commonly known as:  PROLIA Inject 60 mg into the skin every 6 (six) months. Bring to office for administration.   docusate sodium 100 MG capsule Commonly known as:  COLACE Take 1 capsule (100 mg total) by mouth 2 (two) times daily.   estradiol 0.1 MG/GM vaginal cream Commonly known as:  ESTRACE Place 1 Applicatorful vaginally every Monday, Wednesday, and Friday. Reported on 08/07/2015   fluticasone 50 MCG/ACT nasal spray Commonly known as:  FLONASE USE 1 TO 2 SPRAYS IN EACH NOSTRIL AT BEDTIME   furosemide 20 MG tablet Commonly known as:  LASIX TAKE 1 TABLET DAILY   gabapentin 300 MG capsule Commonly known as:  NEURONTIN TAKE (1) CAPSULE THREE TIMES DAILY.   HYDROmorphone  2 MG tablet Commonly known as:  DILAUDID Take 1 tablet (2 mg total) by mouth every 4 (four) hours as needed for severe pain.   hydroxypropyl methylcellulose / hypromellose 2.5 % ophthalmic solution Commonly known as:  ISOPTO TEARS / GONIOVISC Place 1 drop into both eyes as needed for dry eyes.   levothyroxine 75 MCG tablet Commonly known as:   SYNTHROID, LEVOTHROID TAKE 1 TABLET DAILY   meclizine 25 MG tablet Commonly known as:  ANTIVERT Take 25 mg by mouth 3 (three) times daily as needed for dizziness.   omeprazole 40 MG capsule Commonly known as:  PRILOSEC Take 40 mg by mouth daily.   pantoprazole 20 MG tablet Commonly known as:  PROTONIX Take 1 tablet (20 mg total) by mouth daily.   Pitavastatin Calcium 2 MG Tabs Commonly known as:  LIVALO Take 1 tablet (2 mg total) by mouth daily.   potassium chloride SA 20 MEQ tablet Commonly known as:  K-DUR,KLOR-CON TAKE  (1)  TABLET TWICE A DAY.   SYMBICORT 80-4.5 MCG/ACT inhaler Generic drug:  budesonide-formoterol Inhale 1 puff into the lungs 2 (two) times daily.        SignedOphelia Charter 08/25/2016, 7:34 AM

## 2016-08-25 NOTE — Progress Notes (Signed)
Orthopedic Tech Progress Note Patient Details:  Sheila [name] 1940-04-07 [id number]  Patient ID: Sheila [name], female   DOB: June 05, 1940, 76 y.o.   MRN: [id number]   [name] 08/25/2016, 10:00 AM Called in bio-tech brace order; spoke with Bella Kennedy

## 2016-08-26 DIAGNOSIS — M5416 Radiculopathy, lumbar region: Secondary | ICD-10-CM | POA: Diagnosis not present

## 2016-08-26 DIAGNOSIS — Z4789 Encounter for other orthopedic aftercare: Secondary | ICD-10-CM | POA: Diagnosis not present

## 2016-08-26 DIAGNOSIS — K219 Gastro-esophageal reflux disease without esophagitis: Secondary | ICD-10-CM | POA: Diagnosis not present

## 2016-08-26 DIAGNOSIS — E039 Hypothyroidism, unspecified: Secondary | ICD-10-CM | POA: Diagnosis not present

## 2016-08-26 DIAGNOSIS — M48062 Spinal stenosis, lumbar region with neurogenic claudication: Secondary | ICD-10-CM | POA: Diagnosis not present

## 2016-08-26 DIAGNOSIS — Z79891 Long term (current) use of opiate analgesic: Secondary | ICD-10-CM | POA: Diagnosis not present

## 2016-08-26 DIAGNOSIS — Z87891 Personal history of nicotine dependence: Secondary | ICD-10-CM | POA: Diagnosis not present

## 2016-08-26 DIAGNOSIS — Z7951 Long term (current) use of inhaled steroids: Secondary | ICD-10-CM | POA: Diagnosis not present

## 2016-08-26 DIAGNOSIS — I10 Essential (primary) hypertension: Secondary | ICD-10-CM | POA: Diagnosis not present

## 2016-08-27 NOTE — OR Nursing (Signed)
LATE ENTRY: 4 screws documented per RN circulator signed charge sheet.

## 2016-08-28 DIAGNOSIS — I10 Essential (primary) hypertension: Secondary | ICD-10-CM | POA: Diagnosis not present

## 2016-08-28 DIAGNOSIS — Z87891 Personal history of nicotine dependence: Secondary | ICD-10-CM | POA: Diagnosis not present

## 2016-08-28 DIAGNOSIS — K219 Gastro-esophageal reflux disease without esophagitis: Secondary | ICD-10-CM | POA: Diagnosis not present

## 2016-08-28 DIAGNOSIS — E039 Hypothyroidism, unspecified: Secondary | ICD-10-CM | POA: Diagnosis not present

## 2016-08-28 DIAGNOSIS — Z79891 Long term (current) use of opiate analgesic: Secondary | ICD-10-CM | POA: Diagnosis not present

## 2016-08-28 DIAGNOSIS — M48062 Spinal stenosis, lumbar region with neurogenic claudication: Secondary | ICD-10-CM | POA: Diagnosis not present

## 2016-08-28 DIAGNOSIS — M5416 Radiculopathy, lumbar region: Secondary | ICD-10-CM | POA: Diagnosis not present

## 2016-08-28 DIAGNOSIS — Z4789 Encounter for other orthopedic aftercare: Secondary | ICD-10-CM | POA: Diagnosis not present

## 2016-08-28 DIAGNOSIS — Z7951 Long term (current) use of inhaled steroids: Secondary | ICD-10-CM | POA: Diagnosis not present

## 2016-08-31 DIAGNOSIS — E039 Hypothyroidism, unspecified: Secondary | ICD-10-CM | POA: Diagnosis not present

## 2016-08-31 DIAGNOSIS — M5416 Radiculopathy, lumbar region: Secondary | ICD-10-CM | POA: Diagnosis not present

## 2016-08-31 DIAGNOSIS — Z87891 Personal history of nicotine dependence: Secondary | ICD-10-CM | POA: Diagnosis not present

## 2016-08-31 DIAGNOSIS — Z4789 Encounter for other orthopedic aftercare: Secondary | ICD-10-CM | POA: Diagnosis not present

## 2016-08-31 DIAGNOSIS — Z79891 Long term (current) use of opiate analgesic: Secondary | ICD-10-CM | POA: Diagnosis not present

## 2016-08-31 DIAGNOSIS — K219 Gastro-esophageal reflux disease without esophagitis: Secondary | ICD-10-CM | POA: Diagnosis not present

## 2016-08-31 DIAGNOSIS — Z7951 Long term (current) use of inhaled steroids: Secondary | ICD-10-CM | POA: Diagnosis not present

## 2016-08-31 DIAGNOSIS — I10 Essential (primary) hypertension: Secondary | ICD-10-CM | POA: Diagnosis not present

## 2016-08-31 DIAGNOSIS — M48062 Spinal stenosis, lumbar region with neurogenic claudication: Secondary | ICD-10-CM | POA: Diagnosis not present

## 2016-09-02 ENCOUNTER — Other Ambulatory Visit: Payer: Self-pay | Admitting: Family

## 2016-09-04 DIAGNOSIS — Z7951 Long term (current) use of inhaled steroids: Secondary | ICD-10-CM | POA: Diagnosis not present

## 2016-09-04 DIAGNOSIS — Z87891 Personal history of nicotine dependence: Secondary | ICD-10-CM | POA: Diagnosis not present

## 2016-09-04 DIAGNOSIS — M5416 Radiculopathy, lumbar region: Secondary | ICD-10-CM | POA: Diagnosis not present

## 2016-09-04 DIAGNOSIS — K219 Gastro-esophageal reflux disease without esophagitis: Secondary | ICD-10-CM | POA: Diagnosis not present

## 2016-09-04 DIAGNOSIS — I10 Essential (primary) hypertension: Secondary | ICD-10-CM | POA: Diagnosis not present

## 2016-09-04 DIAGNOSIS — Z79891 Long term (current) use of opiate analgesic: Secondary | ICD-10-CM | POA: Diagnosis not present

## 2016-09-04 DIAGNOSIS — E039 Hypothyroidism, unspecified: Secondary | ICD-10-CM | POA: Diagnosis not present

## 2016-09-04 DIAGNOSIS — M48062 Spinal stenosis, lumbar region with neurogenic claudication: Secondary | ICD-10-CM | POA: Diagnosis not present

## 2016-09-04 DIAGNOSIS — Z4789 Encounter for other orthopedic aftercare: Secondary | ICD-10-CM | POA: Diagnosis not present

## 2016-09-07 DIAGNOSIS — Z79891 Long term (current) use of opiate analgesic: Secondary | ICD-10-CM | POA: Diagnosis not present

## 2016-09-07 DIAGNOSIS — M48062 Spinal stenosis, lumbar region with neurogenic claudication: Secondary | ICD-10-CM | POA: Diagnosis not present

## 2016-09-07 DIAGNOSIS — E039 Hypothyroidism, unspecified: Secondary | ICD-10-CM | POA: Diagnosis not present

## 2016-09-07 DIAGNOSIS — Z7951 Long term (current) use of inhaled steroids: Secondary | ICD-10-CM | POA: Diagnosis not present

## 2016-09-07 DIAGNOSIS — Z4789 Encounter for other orthopedic aftercare: Secondary | ICD-10-CM | POA: Diagnosis not present

## 2016-09-07 DIAGNOSIS — Z87891 Personal history of nicotine dependence: Secondary | ICD-10-CM | POA: Diagnosis not present

## 2016-09-07 DIAGNOSIS — I10 Essential (primary) hypertension: Secondary | ICD-10-CM | POA: Diagnosis not present

## 2016-09-07 DIAGNOSIS — M5416 Radiculopathy, lumbar region: Secondary | ICD-10-CM | POA: Diagnosis not present

## 2016-09-07 DIAGNOSIS — K219 Gastro-esophageal reflux disease without esophagitis: Secondary | ICD-10-CM | POA: Diagnosis not present

## 2016-09-09 DIAGNOSIS — M48062 Spinal stenosis, lumbar region with neurogenic claudication: Secondary | ICD-10-CM | POA: Diagnosis not present

## 2016-09-09 DIAGNOSIS — E039 Hypothyroidism, unspecified: Secondary | ICD-10-CM | POA: Diagnosis not present

## 2016-09-09 DIAGNOSIS — I10 Essential (primary) hypertension: Secondary | ICD-10-CM | POA: Diagnosis not present

## 2016-09-09 DIAGNOSIS — K219 Gastro-esophageal reflux disease without esophagitis: Secondary | ICD-10-CM | POA: Diagnosis not present

## 2016-09-09 DIAGNOSIS — Z87891 Personal history of nicotine dependence: Secondary | ICD-10-CM | POA: Diagnosis not present

## 2016-09-09 DIAGNOSIS — Z79891 Long term (current) use of opiate analgesic: Secondary | ICD-10-CM | POA: Diagnosis not present

## 2016-09-09 DIAGNOSIS — Z7951 Long term (current) use of inhaled steroids: Secondary | ICD-10-CM | POA: Diagnosis not present

## 2016-09-09 DIAGNOSIS — Z4789 Encounter for other orthopedic aftercare: Secondary | ICD-10-CM | POA: Diagnosis not present

## 2016-09-09 DIAGNOSIS — M5416 Radiculopathy, lumbar region: Secondary | ICD-10-CM | POA: Diagnosis not present

## 2016-09-22 DIAGNOSIS — M4316 Spondylolisthesis, lumbar region: Secondary | ICD-10-CM | POA: Diagnosis not present

## 2016-09-30 ENCOUNTER — Other Ambulatory Visit: Payer: Self-pay | Admitting: Family

## 2016-10-20 DIAGNOSIS — B351 Tinea unguium: Secondary | ICD-10-CM | POA: Diagnosis not present

## 2016-10-20 DIAGNOSIS — M79676 Pain in unspecified toe(s): Secondary | ICD-10-CM | POA: Diagnosis not present

## 2016-10-29 ENCOUNTER — Ambulatory Visit: Payer: Medicare Other | Attending: Family | Admitting: Audiology

## 2016-11-02 ENCOUNTER — Other Ambulatory Visit: Payer: Self-pay | Admitting: Family

## 2016-11-02 ENCOUNTER — Other Ambulatory Visit (INDEPENDENT_AMBULATORY_CARE_PROVIDER_SITE_OTHER): Payer: Self-pay | Admitting: Internal Medicine

## 2016-11-02 ENCOUNTER — Other Ambulatory Visit: Payer: Self-pay | Admitting: Nurse Practitioner

## 2016-11-02 NOTE — Telephone Encounter (Signed)
Phoned in.

## 2016-11-12 ENCOUNTER — Telehealth: Payer: Self-pay | Admitting: *Deleted

## 2016-11-12 DIAGNOSIS — R32 Unspecified urinary incontinence: Secondary | ICD-10-CM

## 2016-11-12 NOTE — Telephone Encounter (Signed)
RX ready for pick up 

## 2016-11-12 NOTE — Telephone Encounter (Signed)
Script was faxed at 5 pm   11-12-2016.

## 2016-11-12 NOTE — Telephone Encounter (Signed)
Vickie from caps called stating that patient is having frequent accidents and is requesting pull ups. Please fax to [phone]. Order pending if approved please print and sign

## 2016-11-16 DIAGNOSIS — Z1231 Encounter for screening mammogram for malignant neoplasm of breast: Secondary | ICD-10-CM | POA: Diagnosis not present

## 2016-11-23 ENCOUNTER — Other Ambulatory Visit: Payer: Self-pay | Admitting: Family

## 2016-11-24 ENCOUNTER — Other Ambulatory Visit: Payer: Self-pay | Admitting: Family

## 2016-11-30 ENCOUNTER — Other Ambulatory Visit: Payer: Self-pay | Admitting: Nurse Practitioner

## 2016-11-30 ENCOUNTER — Other Ambulatory Visit: Payer: Self-pay | Admitting: Family

## 2016-12-15 ENCOUNTER — Ambulatory Visit (INDEPENDENT_AMBULATORY_CARE_PROVIDER_SITE_OTHER): Payer: Medicare Other | Admitting: Family

## 2016-12-15 ENCOUNTER — Encounter: Payer: Self-pay | Admitting: Family

## 2016-12-15 VITALS — BP 137/82 | HR 85 | Temp 97.2°F | Ht 59.0 in | Wt 205.0 lb

## 2016-12-15 DIAGNOSIS — G629 Polyneuropathy, unspecified: Secondary | ICD-10-CM | POA: Diagnosis not present

## 2016-12-15 DIAGNOSIS — E039 Hypothyroidism, unspecified: Secondary | ICD-10-CM

## 2016-12-15 DIAGNOSIS — M159 Polyosteoarthritis, unspecified: Secondary | ICD-10-CM

## 2016-12-15 DIAGNOSIS — Z0289 Encounter for other administrative examinations: Secondary | ICD-10-CM | POA: Diagnosis not present

## 2016-12-15 DIAGNOSIS — M150 Primary generalized (osteo)arthritis: Secondary | ICD-10-CM

## 2016-12-15 DIAGNOSIS — R739 Hyperglycemia, unspecified: Secondary | ICD-10-CM

## 2016-12-15 DIAGNOSIS — F331 Major depressive disorder, recurrent, moderate: Secondary | ICD-10-CM | POA: Diagnosis not present

## 2016-12-15 DIAGNOSIS — Z23 Encounter for immunization: Secondary | ICD-10-CM | POA: Diagnosis not present

## 2016-12-15 DIAGNOSIS — R7989 Other specified abnormal findings of blood chemistry: Secondary | ICD-10-CM | POA: Diagnosis not present

## 2016-12-15 DIAGNOSIS — E785 Hyperlipidemia, unspecified: Secondary | ICD-10-CM

## 2016-12-15 DIAGNOSIS — G8929 Other chronic pain: Secondary | ICD-10-CM

## 2016-12-15 DIAGNOSIS — M8949 Other hypertrophic osteoarthropathy, multiple sites: Secondary | ICD-10-CM

## 2016-12-15 DIAGNOSIS — M545 Low back pain: Secondary | ICD-10-CM

## 2016-12-15 DIAGNOSIS — F411 Generalized anxiety disorder: Secondary | ICD-10-CM | POA: Diagnosis not present

## 2016-12-15 DIAGNOSIS — I10 Essential (primary) hypertension: Secondary | ICD-10-CM | POA: Diagnosis not present

## 2016-12-15 DIAGNOSIS — B372 Candidiasis of skin and nail: Secondary | ICD-10-CM

## 2016-12-15 LAB — BAYER DCA HB A1C WAIVED: HB A1C (BAYER DCA - WAIVED): 5.6 % (ref <7.0)

## 2016-12-15 MED ORDER — NYSTATIN 100000 UNIT/GM EX POWD: Freq: Four times a day (QID) | CUTANEOUS | 0 refills | Status: DC

## 2016-12-15 MED ORDER — FLUCONAZOLE 150 MG PO TABS: 150.0000 mg | ORAL_TABLET | ORAL | 0 refills | Status: DC | PRN

## 2016-12-15 MED ORDER — TRAMADOL HCL 50 MG PO TABS: 50.0000 mg | ORAL_TABLET | Freq: Two times a day (BID) | ORAL | 3 refills | Status: DC | PRN

## 2016-12-15 NOTE — Addendum Note (Signed)
Addended by: Shelbie Ammons on: 12/15/2016 04:30 PM   Modules accepted: Orders

## 2016-12-15 NOTE — Progress Notes (Signed)
Subjective:    Patient ID: Sheila [name], female    DOB: 23-Nov-1940, 76 y.o.   MRN: [id number]  Pt presents to the office today for chronic follow up. Hypertension  This is a chronic problem. The current episode started more than 1 year ago. The problem has been resolved since onset. The problem is controlled. Associated symptoms include anxiety and peripheral edema. Pertinent negatives include no malaise/fatigue or shortness of breath. PND: At times. Risk factors for coronary artery disease include dyslipidemia, obesity, post-menopausal state and sedentary lifestyle. The current treatment provides moderate improvement. There is no history of kidney disease, CAD/MI, CVA or heart failure. Identifiable causes of hypertension include a thyroid problem.  Hyperlipidemia  This is a chronic problem. The current episode started more than 1 year ago. The problem is uncontrolled. Recent lipid tests were reviewed and are high. Pertinent negatives include no shortness of breath. Current antihyperlipidemic treatment includes statins. The current treatment provides moderate improvement of lipids. Risk factors for coronary artery disease include dyslipidemia, obesity, post-menopausal and a sedentary lifestyle.  Thyroid Problem  Presents for follow-up visit. Symptoms include anxiety, depressed mood, dry skin and hoarse voice. Patient reports no constipation or diarrhea. The symptoms have been stable. Her past medical history is significant for hyperlipidemia. There is no history of heart failure.  Gastroesophageal Reflux  She complains of a hoarse voice. She reports no belching or no coughing. The current episode started more than 1 year ago. The problem occurs occasionally. The problem has been resolved. The symptoms are aggravated by certain foods. Risk factors include obesity. She has tried a PPI for the symptoms. The treatment provided moderate relief.  Arthritis  Presents for follow-up visit. She complains of  pain and stiffness. The symptoms have been stable. Affected locations include the right knee and left knee. Her pain is at a severity of 0/10. Associated symptoms include rash. Pertinent negatives include no diarrhea.  Depression         This is a chronic problem.  The current episode started more than 1 year ago.   The onset quality is gradual.   The problem occurs intermittently.  Associated symptoms include decreased interest.  Associated symptoms include no helplessness, no hopelessness, not irritable and not sad.     The symptoms are aggravated by nothing.  Past treatments include SSRIs - Selective serotonin reuptake inhibitors.  Past medical history includes thyroid problem and anxiety.   Anxiety  Presents for follow-up visit. Symptoms include depressed mood, excessive worry and nervous/anxious behavior. Patient reports no irritability or shortness of breath. Symptoms occur rarely. The quality of sleep is good.    Rash  This is a recurrent problem. The current episode started more than 1 month ago. The problem has been waxing and waning since onset. The affected locations include the abdomen and groin. The rash is characterized by blistering and redness. Pertinent negatives include no cough, diarrhea or shortness of breath. Past treatments include anti-itch cream. The treatment provided mild relief.      Review of Systems  Constitutional: Negative for irritability and malaise/fatigue.  HENT: Positive for hoarse voice.   Respiratory: Negative for cough and shortness of breath.   Cardiovascular: PND: At times.  Gastrointestinal: Negative for constipation and diarrhea.  Musculoskeletal: Positive for arthritis and stiffness.  Skin: Positive for rash.  Psychiatric/Behavioral: Positive for depression. The patient is nervous/anxious.   All other systems reviewed and are negative.      Objective:   Physical Exam  Constitutional:  She is oriented to person, place, and time. She appears  well-developed and well-nourished. She is not irritable. No distress.  HENT:  Head: Normocephalic and atraumatic.  Right Ear: External ear normal.  Mouth/Throat: Oropharynx is clear and moist.  Eyes: Pupils are equal, round, and reactive to light.  Neck: Normal range of motion. Neck supple. No thyromegaly present.  Cardiovascular: Normal rate, regular rhythm, normal heart sounds and intact distal pulses.   No murmur heard. Pulmonary/Chest: Effort normal and breath sounds normal. No respiratory distress. She has no wheezes.  Abdominal: Soft. Bowel sounds are normal. She exhibits no distension. There is no tenderness.  Musculoskeletal: She exhibits edema (trace in bilateral ankles). She exhibits no tenderness.  Using rolling walker  Neurological: She is alert and oriented to person, place, and time.  Skin: Skin is warm and dry. Rash noted.  Erythemas yeast rash lower abdomen into groin  Psychiatric: She has a normal mood and affect. Her behavior is normal. Judgment and thought content normal.  Vitals reviewed.   BP 137/82   Pulse 85   Temp (!) 97.2 F (36.2 C) (Oral)   Ht '4\' 11"'$  (1.499 m)   Wt 205 lb (93 kg)   BMI 41.40 kg/m      Assessment & Plan:  1. Primary osteoarthritis involving multiple joints - CMP14+EGFR - traMADol (ULTRAM) 50 MG tablet; Take 1 tablet (50 mg total) by mouth every 12 (twelve) hours as needed.  Dispense: 60 tablet; Refill: 3  2. Morbid obesity ([place]) - CMP14+EGFR  3. Hypothyroidism, unspecified type  - CMP14+EGFR - TSH  4. GAD (generalized anxiety disorder) - CMP14+EGFR  5. Hyperlipidemia, unspecified hyperlipidemia type  - CMP14+EGFR - Lipid panel  6. Chronic bilateral low back pain, with sciatica presence unspecified  - CMP14+EGFR - traMADol (ULTRAM) 50 MG tablet; Take 1 tablet (50 mg total) by mouth every 12 (twelve) hours as needed.  Dispense: 60 tablet; Refill: 3  7. Moderate episode of recurrent major depressive disorder (HCC) -  CMP14+EGFR  8. Essential hypertension, benign  - CMP14+EGFR  9. Neuropathy - CMP14+EGFR  10. Pain medication agreement signed  - CMP14+EGFR  11. Prerenal azotemia - CMP14+EGFR  12. Hyperglycemia - CMP14+EGFR - Bayer DCA Hb A1c Waived  13. Skin yeast infection Keep clean and dry Do not scratch - fluconazole (DIFLUCAN) 150 MG tablet; Take 1 tablet (150 mg total) by mouth every three (3) days as needed.  Dispense: 4 tablet; Refill: 0 - nystatin (MYCOSTATIN/NYSTOP) powder; Apply topically 4 (four) times daily.  Dispense: 15 g; Refill: 0   Continue all meds Labs pending Health Maintenance reviewed Diet and exercise encouraged RTO 101month   CEvelina Dun FNP

## 2016-12-15 NOTE — Patient Instructions (Signed)
Skin Yeast Infection Skin yeast infection is a condition in which there is an overgrowth of yeast (candida) that normally lives on the skin. This condition usually occurs in areas of the skin that are constantly warm and moist, such as the armpits or the groin. What are the causes? This condition is caused by a change in the normal balance of the yeast and bacteria that live on the skin. What increases the risk? This condition is more likely to develop in:  People who are obese.  Pregnant women.  Women who take birth control pills.  People who have diabetes.  People who take antibiotic medicines.  People who take steroid medicines.  People who are malnourished.  People who have a weak defense (immune) system.  People who are 65 years of age or older.  What are the signs or symptoms? Symptoms of this condition include:  A red, swollen area of the skin.  Bumps on the skin.  Itchiness.  How is this diagnosed? This condition is diagnosed with a medical history and physical exam. Your health care provider may check for yeast by taking light scrapings of the skin to be viewed under a microscope. How is this treated? This condition is treated with medicine. Medicines may be prescribed or be available over-the-counter. The medicines may be:  Taken by mouth (orally).  Applied as a cream.  Follow these instructions at home:  Take or apply over-the-counter and prescription medicines only as told by your health care provider.  Eat more yogurt. This may help to keep your yeast infection from returning.  Maintain a healthy weight. If you need help losing weight, talk with your health care provider.  Keep your skin clean and dry.  If you have diabetes, keep your blood sugar under control. Contact a health care provider if:  Your symptoms go away and then return.  Your symptoms do not get better with treatment.  Your symptoms get worse.  Your rash spreads.  You have a  fever or chills.  You have new symptoms.  You have new warmth or redness of your skin. This information is not intended to replace advice given to you by your health care provider. Make sure you discuss any questions you have with your health care provider. Document Released: 10/28/2010 Document Revised: 10/06/2015 Document Reviewed: 08/13/2014 Elsevier Interactive Patient Education  2018 [employer]  

## 2016-12-16 ENCOUNTER — Other Ambulatory Visit: Payer: Self-pay | Admitting: Family

## 2016-12-16 LAB — CMP14+EGFR
ALT: 28 IU/L (ref 0–32)
AST: 27 IU/L (ref 0–40)
Albumin/Globulin Ratio: 1.6 (ref 1.2–2.2)
Albumin: 4.4 g/dL (ref 3.5–4.8)
Alkaline Phosphatase: 60 IU/L (ref 39–117)
BUN/Creatinine Ratio: 16 (ref 12–28)
BUN: 12 mg/dL (ref 8–27)
Bilirubin Total: 0.5 mg/dL (ref 0.0–1.2)
CO2: 28 mmol/L (ref 20–29)
Calcium: 10.4 mg/dL — ABNORMAL HIGH (ref 8.7–10.3)
Chloride: 101 mmol/L (ref 96–106)
Creatinine, Ser: 0.73 mg/dL (ref 0.57–1.00)
GFR calc Af Amer: 93 mL/min/{1.73_m2} (ref >59)
GFR calc non Af Amer: 81 mL/min/{1.73_m2} (ref >59)
Globulin, Total: 2.8 g/dL (ref 1.5–4.5)
Glucose: 102 mg/dL — ABNORMAL HIGH (ref 65–99)
Potassium: 4.3 mmol/L (ref 3.5–5.2)
Sodium: 143 mmol/L (ref 134–144)
Total Protein: 7.2 g/dL (ref 6.0–8.5)

## 2016-12-16 LAB — LIPID PANEL
Chol/HDL Ratio: 3.8 ratio (ref 0.0–4.4)
Cholesterol, Total: 198 mg/dL (ref 100–199)
HDL: 52 mg/dL (ref >39)
LDL Calculated: 109 mg/dL — ABNORMAL HIGH (ref 0–99)
Triglycerides: 184 mg/dL — ABNORMAL HIGH (ref 0–149)
VLDL Cholesterol Cal: 37 mg/dL (ref 5–40)

## 2016-12-16 LAB — TSH: TSH: 1.66 u[IU]/mL (ref 0.450–4.500)

## 2016-12-22 DIAGNOSIS — H2511 Age-related nuclear cataract, right eye: Secondary | ICD-10-CM | POA: Diagnosis not present

## 2016-12-22 DIAGNOSIS — H02831 Dermatochalasis of right upper eyelid: Secondary | ICD-10-CM | POA: Diagnosis not present

## 2016-12-22 DIAGNOSIS — H2512 Age-related nuclear cataract, left eye: Secondary | ICD-10-CM | POA: Diagnosis not present

## 2016-12-23 ENCOUNTER — Other Ambulatory Visit: Payer: Self-pay | Admitting: Family

## 2016-12-28 ENCOUNTER — Other Ambulatory Visit: Payer: Self-pay | Admitting: Family

## 2016-12-28 DIAGNOSIS — B372 Candidiasis of skin and nail: Secondary | ICD-10-CM

## 2017-01-06 DIAGNOSIS — H25812 Combined forms of age-related cataract, left eye: Secondary | ICD-10-CM | POA: Diagnosis not present

## 2017-01-06 DIAGNOSIS — H2512 Age-related nuclear cataract, left eye: Secondary | ICD-10-CM | POA: Diagnosis not present

## 2017-01-08 DIAGNOSIS — I10 Essential (primary) hypertension: Secondary | ICD-10-CM | POA: Diagnosis not present

## 2017-01-08 DIAGNOSIS — M4316 Spondylolisthesis, lumbar region: Secondary | ICD-10-CM | POA: Diagnosis not present

## 2017-01-13 ENCOUNTER — Encounter: Payer: Self-pay | Admitting: Family Medicine

## 2017-01-13 ENCOUNTER — Ambulatory Visit (INDEPENDENT_AMBULATORY_CARE_PROVIDER_SITE_OTHER): Payer: Medicare Other | Admitting: Family Medicine

## 2017-01-13 VITALS — BP 136/63 | HR 95 | Temp 97.8°F | Ht 59.0 in | Wt 203.0 lb

## 2017-01-13 DIAGNOSIS — M7501 Adhesive capsulitis of right shoulder: Secondary | ICD-10-CM | POA: Diagnosis not present

## 2017-01-13 DIAGNOSIS — F331 Major depressive disorder, recurrent, moderate: Secondary | ICD-10-CM | POA: Diagnosis not present

## 2017-01-13 DIAGNOSIS — M1711 Unilateral primary osteoarthritis, right knee: Secondary | ICD-10-CM

## 2017-01-13 MED ORDER — METHYLPREDNISOLONE ACETATE 80 MG/ML IJ SUSP: 80.0000 mg | Freq: Once | INTRAMUSCULAR | Status: DC

## 2017-01-13 NOTE — Progress Notes (Signed)
BP 136/63 (BP Location: Left Arm, Patient Position: Sitting, Cuff Size: Normal)   Pulse 95   Temp 97.8 F (36.6 C) (Oral)   Ht 4\' 11"  (1.499 m)   Wt 203 lb (92.1 kg)   BMI 41.00 kg/m    Subjective:    Patient ID: Sheila [name], female    DOB: 17-Jan-1941, 76 y.o.   MRN: [id number]  HPI: Sheila [name] is a 76 y.o. female presenting on 01/13/2017 for Shoulder Pain (Right ); Leg Pain; and Depression (worsening per pt)   HPI Right shoulder pain Patient is coming in with complaints of right shoulder pain that is been going on for 5-6 years but has worsened over the past year.  She says is gotten the point where she cannot lift her arm up all past 90 degrees.  She says it hurts most of the time and is about a 7 out of 10 but then gets worse when she lays on it or tries to lift it up overhead and goes up to a 10 out of 10.  She says that it has gotten so bad that she has trouble using the arm to lift things like she normally would.  She denies any numbness going down the arm or weakness in her hand or wrist or elbow.  Right knee pain/leg pain Patient complains of right knee pain/leg pain that has been bothering her since last night.  She says she was rolling over and felt the pain worse.  She says she has had arthritis in that leg and knee pains before but it just got worse last night than what it has been.  She denies any redness or warmth or fevers or chills.  She does complain of it being weak but denies any numbness.  She says the pain extends from there down her leg but denies any swelling today.  She says the worst of the pain is in her knee and she feels like it pops and catches.  Anxiety depression Patient comes in with worsened anxiety depression.  She does have a primary care physician who is treating this who she has not seen in a little bit and she needs to come in and see her.  She denies any suicidal ideations or thoughts of hurting himself.  Daughter brings her in and says that she is  feeling down and depressed a lot of the time. Depression screen [facility] 2/9 01/13/2017 12/15/2016 08/13/2016 07/08/2016 05/11/2016  Decreased Interest 3 0 0 1 0  Down, Depressed, Hopeless 3 0 0 1 1  PHQ - 2 Score 6 0 0 2 1  Altered sleeping 2 - - 1 -  Tired, decreased energy 3 - - 1 -  Change in appetite 3 - - 0 -  Feeling bad or failure about yourself  2 - - 0 -  Trouble concentrating 0 - - 0 -  Moving slowly or fidgety/restless 0 - - 0 -  Suicidal thoughts 0 - - 0 -  PHQ-9 Score 16 - - 4 -  Difficult doing work/chores Somewhat difficult - - Somewhat difficult -  Some recent data might be hidden     Relevant past medical, surgical, family and social history reviewed and updated as indicated. Interim medical history since our last visit reviewed. Allergies and medications reviewed and updated.  Review of Systems  Constitutional: Negative for chills and fever.  Eyes: Negative for visual disturbance.  Respiratory: Negative for chest tightness and shortness of breath.  Cardiovascular: Negative for chest pain and leg swelling.  Musculoskeletal: Positive for arthralgias. Negative for back pain, gait problem, joint swelling, neck pain and neck stiffness.  Skin: Negative for color change and rash.  Neurological: Negative for dizziness, light-headedness and headaches.  Psychiatric/Behavioral: Positive for dysphoric mood and sleep disturbance. Negative for agitation, behavioral problems, self-injury and suicidal ideas. The patient is nervous/anxious.   All other systems reviewed and are negative.   Per HPI unless specifically indicated above     Objective:    BP 136/63 (BP Location: Left Arm, Patient Position: Sitting, Cuff Size: Normal)   Pulse 95   Temp 97.8 F (36.6 C) (Oral)   Ht 4\' 11"  (1.499 m)   Wt 203 lb (92.1 kg)   BMI 41.00 kg/m   Wt Readings from Last 3 Encounters:  01/13/17 203 lb (92.1 kg)  12/15/16 205 lb (93 kg)  08/24/16 189 lb (85.7 kg)    Physical Exam    Constitutional: She is oriented to person, place, and time. She appears well-developed and well-nourished. No distress.  Eyes: Conjunctivae are normal.  Cardiovascular: Normal rate, regular rhythm, normal heart sounds and intact distal pulses.  No murmur heard. Pulmonary/Chest: Effort normal and breath sounds normal. No respiratory distress. She has no wheezes. She has no rales.  Musculoskeletal: She exhibits no edema.       Right shoulder: She exhibits decreased range of motion, tenderness (Tenderness and decreased range of motion of abduction both passively and actively, very reduced range of motion in shoulder with flexion or extension as well.) and bony tenderness. She exhibits no swelling, no crepitus, no deformity and normal strength.       Right knee: She exhibits effusion. She exhibits normal range of motion, no swelling, normal alignment, no LCL laxity, normal patellar mobility, no bony tenderness, normal meniscus and no MCL laxity. Tenderness found. Medial joint line tenderness noted.  Neurological: She is alert and oriented to person, place, and time. Coordination normal.  Skin: Skin is warm and dry. No rash noted. She is not diaphoretic.  Psychiatric: Her behavior is normal. Judgment normal. Her mood appears anxious. She exhibits a depressed mood. She expresses no suicidal ideation. She expresses no suicidal plans.  Nursing note and vitals reviewed.       Assessment & Plan:   Problem List Items Addressed This Visit      Other   Depressed    Not suicidal but just worsened depression, will treat for pain, set up appointment with PCP       Other Visit Diagnoses    Adhesive capsulitis of right shoulder    -  Primary   Relevant Medications   methylPREDNISolone acetate (DEPO-MEDROL) injection 80 mg   Other Relevant Orders   Ambulatory referral to Orthopedic Surgery   Arthritis of right knee       Relevant Medications   methylPREDNISolone acetate (DEPO-MEDROL) injection 80 mg    Other Relevant Orders   Ambulatory referral to Orthopedic Surgery       Follow up plan: Return if symptoms worsen or fail to improve, for Needs an appointment with [facility] ASAP.  Counseling provided for all of the vaccine components Orders Placed This Encounter  Procedures  . Ambulatory referral to [place] Lorenza Winkleman, MD [facility] 01/13/2017, 6:22 PM

## 2017-01-13 NOTE — Assessment & Plan Note (Signed)
Not suicidal but just worsened depression, will treat for pain, set up appointment with PCP

## 2017-01-18 ENCOUNTER — Ambulatory Visit (INDEPENDENT_AMBULATORY_CARE_PROVIDER_SITE_OTHER): Payer: Medicare Other | Admitting: Family

## 2017-01-18 ENCOUNTER — Encounter: Payer: Self-pay | Admitting: Family

## 2017-01-18 VITALS — BP 139/87 | HR 88 | Temp 97.8°F | Ht 59.0 in | Wt 202.6 lb

## 2017-01-18 DIAGNOSIS — F331 Major depressive disorder, recurrent, moderate: Secondary | ICD-10-CM

## 2017-01-18 DIAGNOSIS — F411 Generalized anxiety disorder: Secondary | ICD-10-CM | POA: Diagnosis not present

## 2017-01-18 MED ORDER — DULOXETINE HCL 30 MG PO CPEP: 30.0000 mg | ORAL_CAPSULE | Freq: Every day | ORAL | 1 refills | Status: DC

## 2017-01-18 NOTE — Patient Instructions (Signed)

## 2017-01-18 NOTE — Progress Notes (Signed)
   Subjective:    Patient ID: Sheila [name], female    DOB: 1940-06-08, 76 y.o.   MRN: [id number]  Pt presents to the office today with complaints of worsening depression.  Depression         This is a chronic problem.  The current episode started more than 1 year ago.   The onset quality is gradual.   The problem occurs intermittently.  The problem has been gradually worsening since onset.  Associated symptoms include irritable, restlessness, decreased interest and sad.  Associated symptoms include no helplessness, no hopelessness and no suicidal ideas.     The symptoms are aggravated by family issues.  Past treatments include SSRIs - Selective serotonin reuptake inhibitors.     Review of Systems  Musculoskeletal: Positive for arthralgias, back pain and gait problem.  Psychiatric/Behavioral: Positive for depression. Negative for suicidal ideas.  All other systems reviewed and are negative.      Objective:   Physical Exam  Constitutional: She is oriented to person, place, and time. She appears well-developed and well-nourished. She is irritable. No distress.  HENT:  Head: Normocephalic.  Eyes: Pupils are equal, round, and reactive to light.  Neck: Normal range of motion. Neck supple. No thyromegaly present.  Cardiovascular: Normal rate, regular rhythm, normal heart sounds and intact distal pulses.  No murmur heard. Pulmonary/Chest: Effort normal and breath sounds normal. No respiratory distress. She has no wheezes.  Abdominal: Soft. Bowel sounds are normal. She exhibits no distension. There is no tenderness.  Musculoskeletal: She exhibits tenderness. She exhibits no edema.  Generalized weakness, using rolling walker, pain in right knee with flexion and low back pain with flexion and extension   Neurological: She is alert and oriented to person, place, and time.  Skin: Skin is warm and dry.  Psychiatric: She has a normal mood and affect. Her behavior is normal. Judgment and thought  content normal.  Vitals reviewed.   BP 139/87   Pulse 88   Temp 97.8 F (36.6 C) (Oral)   Ht 4\' 11"  (1.499 m)   Wt 202 lb 9.6 oz (91.9 kg)   BMI 40.92 kg/m       Assessment & Plan:  1. Moderate episode of recurrent major depressive disorder (HCC) - DULoxetine (CYMBALTA) 30 MG capsule; Take 1 capsule (30 mg total) by mouth daily.  Dispense: 90 capsule; Refill: 1  2. GAD (generalized anxiety disorder) - DULoxetine (CYMBALTA) 30 MG capsule; Take 1 capsule (30 mg total) by mouth daily.  Dispense: 90 capsule; Refill: 1   Will add Cymbalta 30 mg today  Stress management discussed Continue appt with Ortho  For joint pain RTO prn and keep chronic follow up- If depression not improved will increase Cymbalta to 60 mg  [name], FNP

## 2017-01-19 ENCOUNTER — Other Ambulatory Visit: Payer: Self-pay | Admitting: Family

## 2017-01-21 ENCOUNTER — Ambulatory Visit (INDEPENDENT_AMBULATORY_CARE_PROVIDER_SITE_OTHER): Payer: Medicare Other | Admitting: Orthopaedic Surgery

## 2017-01-21 ENCOUNTER — Ambulatory Visit (INDEPENDENT_AMBULATORY_CARE_PROVIDER_SITE_OTHER): Payer: Self-pay

## 2017-01-21 ENCOUNTER — Ambulatory Visit (INDEPENDENT_AMBULATORY_CARE_PROVIDER_SITE_OTHER): Payer: Medicare Other

## 2017-01-21 ENCOUNTER — Encounter (INDEPENDENT_AMBULATORY_CARE_PROVIDER_SITE_OTHER): Payer: Self-pay | Admitting: Orthopaedic Surgery

## 2017-01-21 VITALS — BP 150/93 | HR 77 | Ht 59.0 in | Wt 202.0 lb

## 2017-01-21 DIAGNOSIS — G8929 Other chronic pain: Secondary | ICD-10-CM | POA: Diagnosis not present

## 2017-01-21 DIAGNOSIS — M25561 Pain in right knee: Secondary | ICD-10-CM

## 2017-01-21 DIAGNOSIS — M25511 Pain in right shoulder: Secondary | ICD-10-CM | POA: Diagnosis not present

## 2017-01-21 DIAGNOSIS — M1711 Unilateral primary osteoarthritis, right knee: Secondary | ICD-10-CM

## 2017-01-21 NOTE — Progress Notes (Signed)
Office Visit Note   Patient: Sheila [name]           Date of Birth: 1940-10-13           MRN: [id number] Visit Date: 01/21/2017              Requested by: Sharion Balloon, [address], [place] [address] PCP: Sharion Balloon, FNP   Assessment & Plan: Visit Diagnoses:  1. Chronic right shoulder pain   2. Chronic pain of right knee   3. Unilateral primary osteoarthritis, right knee     Plan: Injection performed right shoulder subacromially as well as right knee particularly which he tolerated well.  We discussed the severe degenerative changes in the right knee and I will recheck her again in 6 weeks.  Follow-Up Instructions: Return in about 6 weeks (around 03/04/2017).   Orders:  Orders Placed This Encounter  Procedures  . Large Joint Inj: R knee  . Large Joint Inj: R subacromial bursa  . XR Knee 1-2 Views Right  . XR Shoulder Right   No orders of the defined types were placed in this encounter.     Procedures: Large Joint Inj: R knee on 01/21/2017 3:17 PM Indications: pain and joint swelling Details: 22 G 1.5 in needle, anterolateral approach  Arthrogram: No  Medications: 40 mg methylPREDNISolone acetate 40 MG/ML; 0.5 mL lidocaine 1 %; 4 mL bupivacaine 0.25 % Outcome: tolerated well, no immediate complications Procedure, treatment alternatives, risks and benefits explained, specific risks discussed. Consent was given by the patient. Immediately prior to procedure a time out was called to verify the correct patient, procedure, equipment, support staff and site/side marked as required. Patient was prepped and draped in the usual sterile fashion.   Large Joint Inj: R subacromial bursa on 01/21/2017 3:18 PM Indications: pain Details: 22 G 1.5 in needle  Arthrogram: No  Medications: 4 mL bupivacaine 0.25 %; 40 mg methylPREDNISolone acetate 40 MG/ML; 0.5 mL lidocaine 1 % Outcome: tolerated well, no immediate complications Procedure, treatment  alternatives, risks and benefits explained, specific risks discussed. Consent was given by the patient. Immediately prior to procedure a time out was called to verify the correct patient, procedure, equipment, support staff and site/side marked as required. Patient was prepped and draped in the usual sterile fashion.       Clinical Data: No additional findings.   Subjective: Chief Complaint  Patient presents with  . Right Shoulder - Pain  . Right Knee - Pain    HPI 76-year-old female with chronic right shoulder and chronic right knee pain.  Progressed over several years.  She has difficulty and is unable to raise her right arm to help shower and has popping in her shoulder with certain movements.  She also has right knee pain which is chronic.  She denies swelling in the knee.  Walking.  All uses a rolling walker and pain tends to come and go, both areas.  Review of Systems for right knee osteoarthritis.  Spondylolisthesis lumbar region.  Hypothyroidism, hypertension, depression, morbid obesity, neuropathy of opioid dependence systems updated.   Objective: Vital Signs: BP (!) 150/93   Pulse 77   Ht 4\' 11"  (1.499 m)   Wt 202 lb (91.6 kg)   BMI 40.80 kg/m   Physical Exam  Constitutional: She is oriented to person, place, and time. She appears well-developed.  HENT:  Head: Normocephalic.  Right Ear: External ear normal.  Left Ear: External ear normal.  Eyes:  Pupils are equal, round, and reactive to light.  Neck: No tracheal deviation present. No thyromegaly present.  Cardiovascular: Normal rate.  Pulmonary/Chest: Effort normal.  Abdominal: Soft.  Neurological: She is alert and oriented to person, place, and time.  Skin: Skin is warm and dry.  Psychiatric: She has a normal mood and affect. Her behavior is normal.    Ortho Exam patient has positive impingement right shoulder.  Mild brachial plexus tenderness negative Spurling.  Upper extremity reflexes are 2+.  Supraspinatus  function adequate strength.  Internal and external rotation is strong right and left shoulder.  Sensation her hand is intact.  No hyperthenar atrophy.  Right knee shows significant crepitus with knee range of motion.  Trace effusion noted collateral and crucial ligament exam is normal.  Lacks 5 degrees reaching full extension.  Opposite left knee.  Well-healed posterior lumbar incision from previous L4-L5 fusion.  Specialty Comments:  No specialty comments available.  Imaging: No results found.   PMFS History: Patient Active Problem List   Diagnosis Date Noted  . Spondylolisthesis of lumbar region 08/24/2016  . Opioid dependence ([place]) 04/29/2015  . Pain medication agreement signed 04/29/2015  . Chronic back pain 04/29/2015  . Osteoarthritis 04/29/2015  . Metabolic syndrome 35/57/3220  . Morbid obesity ([place]) 10/18/2014  . Neuropathy 10/18/2014  . Hypokalemia 10/18/2014  . Depressed 09/05/2013  . Osteoporosis 06/16/2012  . PUD (peptic ulcer disease) 06/16/2012  . GAD (generalized anxiety disorder) 06/16/2012  . Hyperlipidemia 06/16/2012  . DJD (degenerative joint disease) 06/16/2012  . Hypercalcemia 06/16/2012  . Essential hypertension, benign 06/16/2012  . Gastric ulcer with hemorrhage 01/14/2011  . Diverticulosis of colon 01/13/2011  . Prerenal azotemia 01/13/2011  . Leukocytosis 01/13/2011  . Sacral insufficiency fracture 01/13/2011  . Hypothyroidism 01/13/2011  . Orthostatic lightheadedness 01/13/2011  . ABNORMAL EKG 07/16/2009   Past Medical History:  Diagnosis Date  . Allergy   . Bell's palsy   . Cancer ([place])    skin CA on face  . Cataract   . Chest pain 07/2009   Myoveiw stress test negative.  . Depression   . Diverticulosis of colon   . DJD (degenerative joint disease) of knee   . Dyspnea    on exertion  . Family history of adverse reaction to anesthesia    sister ha nausea/vomiting  . Gastric ulcer with hemorrhage 01/14/2011  . GERD (gastroesophageal  reflux disease)   . Headache   . History of fractured pelvis   . Hypercholesteremia   . Hypertension   . Hypothyroidism   . UTI (lower urinary tract infection)     Family History  Problem Relation Age of Onset  . Aneurysm Mother   . Stroke Mother   . Hypertension Mother   . Cancer Father        lung  . Heart disease Sister   . Hip fracture Sister   . Cancer Brother   . Alzheimer's disease Sister   . Cancer Sister        skin, uterine  . Cancer Brother     Past Surgical History:  Procedure Laterality Date  . APPENDECTOMY    . BREAST BIOPSY Left   . ESOPHAGOGASTRODUODENOSCOPY  01/14/2011   Procedure: ESOPHAGOGASTRODUODENOSCOPY (EGD);  Surgeon: Rogene Houston, MD;  Location: [facility];  Service: Endoscopy;  Laterality: N/A;  . HIP SURGERY     pelvis  . JOINT REPLACEMENT    . KNEE SURGERY  04/2010.   Total left knee replacement  .  LAPAROSCOPIC APPENDECTOMY N/A 08/01/2013   Procedure: APPENDECTOMY LAPAROSCOPIC;  Surgeon: Jamesetta So, MD;  Location: [facility] ORS;  Service: General;  Laterality: N/A;   Social History   Occupational History  . Not on file  Tobacco Use  . Smoking status: Former Smoker    Types: Cigarettes    Last attempt to quit: 02/24/1968    Years since quitting: 48.9  . Smokeless tobacco: Current User    Types: Snuff  . Tobacco comment: quit yrs and yrs ago when she was very young  Substance and Sexual Activity  . Alcohol use: No  . Drug use: No  . Sexual activity: No

## 2017-01-26 MED ORDER — LIDOCAINE HCL 1 % IJ SOLN
0.5000 mL | INTRAMUSCULAR | Status: AC | PRN
  Administered 2017-01-21: .5 mL

## 2017-01-26 MED ORDER — METHYLPREDNISOLONE ACETATE 40 MG/ML IJ SUSP
40.0000 mg | INTRAMUSCULAR | Status: AC | PRN
  Administered 2017-01-21: 40 mg via INTRA_ARTICULAR

## 2017-01-26 MED ORDER — BUPIVACAINE HCL 0.25 % IJ SOLN
4.0000 mL | INTRAMUSCULAR | Status: AC | PRN
Start: 2017-01-21 — End: 2017-01-21
  Administered 2017-01-21: 4 mL via INTRA_ARTICULAR

## 2017-01-28 ENCOUNTER — Other Ambulatory Visit: Payer: Self-pay | Admitting: Family Medicine

## 2017-01-28 ENCOUNTER — Other Ambulatory Visit: Payer: Self-pay | Admitting: Family

## 2017-01-29 ENCOUNTER — Other Ambulatory Visit: Payer: Self-pay | Admitting: Family

## 2017-01-29 MED ORDER — BUDESONIDE-FORMOTEROL FUMARATE 80-4.5 MCG/ACT IN AERO: INHALATION_SPRAY | RESPIRATORY_TRACT | 5 refills | Status: DC

## 2017-01-29 NOTE — Addendum Note (Signed)
Addended by: Nigel Berthold C on: 01/29/2017 12:28 PM   Modules accepted: Orders

## 2017-01-29 NOTE — Telephone Encounter (Signed)
Last refill 12/28/2016. Last seen 01/18/2017. If approved please route to pool for nurse to call into pharmacy

## 2017-01-29 NOTE — Telephone Encounter (Signed)
Rx phoned in. Symbicort sent in per [facility].

## 2017-02-10 ENCOUNTER — Other Ambulatory Visit: Payer: Self-pay | Admitting: Family

## 2017-02-24 ENCOUNTER — Other Ambulatory Visit: Payer: Self-pay | Admitting: Family

## 2017-02-26 ENCOUNTER — Other Ambulatory Visit: Payer: Self-pay | Admitting: Family

## 2017-03-02 ENCOUNTER — Ambulatory Visit: Payer: Medicare Other | Admitting: Nurse Practitioner

## 2017-03-02 ENCOUNTER — Ambulatory Visit: Payer: Medicare Other | Admitting: Family

## 2017-03-04 ENCOUNTER — Ambulatory Visit (INDEPENDENT_AMBULATORY_CARE_PROVIDER_SITE_OTHER): Payer: Medicare Other | Admitting: Orthopaedic Surgery

## 2017-03-04 ENCOUNTER — Encounter (INDEPENDENT_AMBULATORY_CARE_PROVIDER_SITE_OTHER): Payer: Self-pay | Admitting: Orthopaedic Surgery

## 2017-03-04 DIAGNOSIS — M1711 Unilateral primary osteoarthritis, right knee: Secondary | ICD-10-CM | POA: Diagnosis not present

## 2017-03-04 NOTE — Progress Notes (Signed)
Office Visit Note   Patient: Sheila [name]           Date of Birth: 1940-06-08           MRN: [id number] Visit Date: 03/04/2017              Requested by: Sharion Balloon, [address], [place] [address] PCP: Sharion Balloon, FNP   Assessment & Plan: Visit Diagnoses:  1. Morbid obesity ([place])   2. Unilateral primary osteoarthritis, right knee   3.      Right shoulder pain, likely rotator cuff tear   Plan: Patient has bone-on-bone changes right knee failed injection is already using a walker.  She needs to lose 5 pounds to get her BMI below 40 and I will recheck her again in 3 months we will weigh her on return.  We discussed increased postoperative risk associated with elevated BMI.  He is already had a left total knee arthroplasty 4 years ago which did well with good pain relief.  Right knee wakes her up at night she has pain with ambulation and old.  We discussed additional benefits to overall health with weight loss.  Follow-Up Instructions: Return in about 3 months (around 06/02/2017).   Orders:  No orders of the defined types were placed in this encounter.  No orders of the defined types were placed in this encounter.     Procedures: No procedures performed   Clinical Data: No additional findings.   Subjective: Chief Complaint  Patient presents with  . Right Shoulder - Follow-up  . Right Knee - Follow-up    HPI returns with ongoing problems with the right knee with bone-on-bone changes for knee osteoarthritis.  Injection gave her a little bit of relief but she is having problems walking again and is using a rolling walker reverse seat.  Still has problems with her right shoulder which likely has a full-thickness rotator cuff tear since she has problems with outstretched reaching and overhead activities.  She avoids those activities and states avoid them her shoulder does okay.  Her knee hurts a lot.  She states she wants to proceed with total knee  arthroplasty.  Previous total knee arthroplasty without about 4 years ago which did well.  Stay with her at home and also has an aide that comes in to check her and help her.  Review of Systems positive for knee osteoarthritis, spondylolisthesis lumbar region.  Previous lumbar surgery.  Hypothyroidism, hypertension, depression, morbid obesity, opioid pain medication dependence, Norco and tramadol.  Positive for right shoulder pain.  Did have some episodes were sometimes her heart felt like it was beating faster.  She seen a cardiologist in [facility].  Otherwise negative as pertains HPI.   Objective: Vital Signs: BP 98/62   Pulse 63   Ht 5' (1.524 m)   Wt 207 lb (93.9 kg)   BMI 40.43 kg/m  5 ft tall, 208 lbs BMI 40.6  Physical Exam  Constitutional: She is oriented to person, place, and time. She appears well-developed.  HENT:  Head: Normocephalic.  Right Ear: External ear normal.  Left Ear: External ear normal.  Eyes: Pupils are equal, round, and reactive to light.  Neck: No tracheal deviation present. No thyromegaly present.  Cardiovascular: Normal rate.  Pulmonary/Chest: Effort normal.  Abdominal: Soft.  Neurological: She is alert and oriented to person, place, and time.  Skin: Skin is warm and dry.  Psychiatric: She has a normal mood  and affect. Her behavior is normal.    Ortho Exam drop arm test right shoulder.  Internal rotation with positive Neer.  Healed left total knee arthroplasty incision no pain with hip range of motion.  Right knee shows crepitus with knee range of motion and varus deformity.  Previous x-rays bone-on-bone changes medial compartment.  Specialty Comments:  No specialty comments available.  Imaging: No results found.   PMFS History: Patient Active Problem List   Diagnosis Date Noted  . Unilateral primary osteoarthritis, right knee 03/04/2017  . Spondylolisthesis of lumbar region 08/24/2016  . Opioid dependence ([place]) 04/29/2015  . Pain  medication agreement signed 04/29/2015  . Chronic back pain 04/29/2015  . Osteoarthritis 04/29/2015  . Metabolic syndrome 88/41/6606  . Morbid obesity ([place]) 10/18/2014  . Neuropathy 10/18/2014  . Hypokalemia 10/18/2014  . Depressed 09/05/2013  . Osteoporosis 06/16/2012  . PUD (peptic ulcer disease) 06/16/2012  . GAD (generalized anxiety disorder) 06/16/2012  . Hyperlipidemia 06/16/2012  . DJD (degenerative joint disease) 06/16/2012  . Hypercalcemia 06/16/2012  . Essential hypertension, benign 06/16/2012  . Gastric ulcer with hemorrhage 01/14/2011  . Diverticulosis of colon 01/13/2011  . Prerenal azotemia 01/13/2011  . Leukocytosis 01/13/2011  . Sacral insufficiency fracture 01/13/2011  . Hypothyroidism 01/13/2011  . Orthostatic lightheadedness 01/13/2011  . ABNORMAL EKG 07/16/2009   Past Medical History:  Diagnosis Date  . Allergy   . Bell's palsy   . Cancer ([place])    skin CA on face  . Cataract   . Chest pain 07/2009   Myoveiw stress test negative.  . Depression   . Diverticulosis of colon   . DJD (degenerative joint disease) of knee   . Dyspnea    on exertion  . Family history of adverse reaction to anesthesia    sister ha nausea/vomiting  . Gastric ulcer with hemorrhage 01/14/2011  . GERD (gastroesophageal reflux disease)   . Headache   . History of fractured pelvis   . Hypercholesteremia   . Hypertension   . Hypothyroidism   . UTI (lower urinary tract infection)     Family History  Problem Relation Age of Onset  . Aneurysm Mother   . Stroke Mother   . Hypertension Mother   . Cancer Father        lung  . Heart disease Sister   . Hip fracture Sister   . Cancer Brother   . Alzheimer's disease Sister   . Cancer Sister        skin, uterine  . Cancer Brother     Past Surgical History:  Procedure Laterality Date  . APPENDECTOMY    . BREAST BIOPSY Left   . ESOPHAGOGASTRODUODENOSCOPY  01/14/2011   Procedure: ESOPHAGOGASTRODUODENOSCOPY (EGD);  Surgeon:  Rogene Houston, MD;  Location[facility];  Service: Endoscopy;  Laterality: N/A;  . HIP SURGERY     pelvis  . JOINT REPLACEMENT    . KNEE SURGERY  04/2010.   Total left knee replacement  . LAPAROSCOPIC APPENDECTOMY N/A 08/01/2013   Procedure: APPENDECTOMY LAPAROSCOPIC;  Surgeon: Jamesetta So, MD;  Location: [facility] ORS;  Service: General;  Laterality: N/A;   Social History   Occupational History  . Not on file  Tobacco Use  . Smoking status: Former Smoker    Types: Cigarettes    Last attempt to quit: 02/24/1968    Years since quitting: 49.0  . Smokeless tobacco: Current User    Types: Snuff  . Tobacco comment: quit yrs and yrs ago when  she was very young  Substance and Sexual Activity  . Alcohol use: No  . Drug use: No  . Sexual activity: No

## 2017-03-09 ENCOUNTER — Ambulatory Visit (INDEPENDENT_AMBULATORY_CARE_PROVIDER_SITE_OTHER): Payer: Medicare Other | Admitting: Family

## 2017-03-09 ENCOUNTER — Encounter: Payer: Self-pay | Admitting: Family

## 2017-03-09 VITALS — BP 136/81 | HR 90 | Temp 97.0°F | Ht 59.0 in | Wt 203.2 lb

## 2017-03-09 DIAGNOSIS — M545 Low back pain: Secondary | ICD-10-CM

## 2017-03-09 DIAGNOSIS — G8929 Other chronic pain: Secondary | ICD-10-CM

## 2017-03-09 DIAGNOSIS — I10 Essential (primary) hypertension: Secondary | ICD-10-CM | POA: Diagnosis not present

## 2017-03-09 DIAGNOSIS — F1120 Opioid dependence, uncomplicated: Secondary | ICD-10-CM

## 2017-03-09 DIAGNOSIS — E785 Hyperlipidemia, unspecified: Secondary | ICD-10-CM | POA: Diagnosis not present

## 2017-03-09 DIAGNOSIS — M150 Primary generalized (osteo)arthritis: Secondary | ICD-10-CM

## 2017-03-09 DIAGNOSIS — G629 Polyneuropathy, unspecified: Secondary | ICD-10-CM

## 2017-03-09 DIAGNOSIS — M8949 Other hypertrophic osteoarthropathy, multiple sites: Secondary | ICD-10-CM

## 2017-03-09 DIAGNOSIS — M159 Polyosteoarthritis, unspecified: Secondary | ICD-10-CM

## 2017-03-09 MED ORDER — TRAMADOL HCL 50 MG PO TABS: 50.0000 mg | ORAL_TABLET | Freq: Two times a day (BID) | ORAL | 3 refills | Status: DC | PRN

## 2017-03-09 NOTE — Progress Notes (Signed)
Subjective:    Patient ID: Sheila [name], female    DOB: 1940/09/20, 77 y.o.   MRN: [id number]  Pt presents to the office today for chronic follow up. PT is followed by Ortho for osteoarthritis.   Depression         This is a chronic problem.  The current episode started more than 1 year ago.   The onset quality is gradual.   The problem occurs intermittently.  The problem has been waxing and waning since onset.  Associated symptoms include irritable.  Associated symptoms include no helplessness, no hopelessness and not sad.     The symptoms are aggravated by family issues.  Past treatments include SNRIs - Serotonin and norepinephrine reuptake inhibitors.  Compliance with treatment is good. Back Pain  This is a chronic problem. The current episode started more than 1 year ago. The problem occurs intermittently. The problem has been waxing and waning since onset. The pain is present in the lumbar spine. The pain is at a severity of 3/10. The pain is moderate. She has tried bed rest and analgesics for the symptoms. The treatment provided mild relief.  Arthritis  Presents for follow-up visit. She complains of pain and stiffness. The symptoms have been stable. Affected locations include the left knee and right knee (back). Her pain is at a severity of 4/10.  Hypertension  This is a chronic problem. The current episode started more than 1 year ago. The problem has been resolved since onset. The problem is controlled. Pertinent negatives include no malaise/fatigue, peripheral edema or shortness of breath. Risk factors for coronary artery disease include dyslipidemia, obesity and sedentary lifestyle. The current treatment provides moderate improvement.  Neuropathy Pt currently taking gabapentin and cymbalta. States this is helping.     Review of Systems  Constitutional: Negative for malaise/fatigue.  Respiratory: Negative for shortness of breath.   Musculoskeletal: Positive for arthritis, back pain  and stiffness.  Psychiatric/Behavioral: Positive for depression.  All other systems reviewed and are negative.      Objective:   Physical Exam  Constitutional: She is oriented to person, place, and time. She appears well-developed and well-nourished. She is irritable. No distress.  HENT:  Head: Normocephalic and atraumatic.  Eyes: Pupils are equal, round, and reactive to light.  Neck: Normal range of motion. Neck supple. No thyromegaly present.  Cardiovascular: Normal rate, regular rhythm, normal heart sounds and intact distal pulses.  No murmur heard. Pulmonary/Chest: Effort normal. No respiratory distress. She has decreased breath sounds. She has no wheezes.  Abdominal: Soft. Bowel sounds are normal. She exhibits no distension. There is no tenderness.  Musculoskeletal: She exhibits tenderness. She exhibits no edema.  Pain in lower lumbar with flexion and extension. Pain in right shoulder with abduction   Neurological: She is alert and oriented to person, place, and time.  Skin: Skin is warm and dry.  Psychiatric: She has a normal mood and affect. Her behavior is normal. Judgment and thought content normal.  Vitals reviewed.     BP 136/81   Pulse 90   Temp (!) 97 F (36.1 C) (Oral)   Ht _0  (1.499 m)   Wt 203 lb 3.2 oz (92.2 kg)   BMI 41.04 kg/m      Assessment & Plan:  1. Essential hypertension, benign - CMP14+EGFR  2. Neuropathy - CMP14+EGFR  3. Primary osteoarthritis involving multiple joints - traMADol (ULTRAM) 50 MG tablet; Take 1 tablet (50 mg total) by mouth every 12 (twelve)  hours as needed.  Dispense: 60 tablet; Refill: 3 - CMP14+EGFR  4. Morbid obesity (HCC) - CMP14+EGFR  5. Uncomplicated opioid dependence ([place]) - CMP14+EGFR  6. Chronic bilateral low back pain, with sciatica presence unspecified - traMADol (ULTRAM) 50 MG tablet; Take 1 tablet (50 mg total) by mouth every 12 (twelve) hours as needed.  Dispense: 60 tablet; Refill: 3 -  CMP14+EGFR  7. Hyperlipidemia, unspecified hyperlipidemia type - CMP14+EGFR   Continue all meds and keep follow up with Orth[facility] pending Health Maintenance reviewed Diet and exercise encouraged RTO 3 months   [name], FNP

## 2017-03-09 NOTE — Patient Instructions (Signed)

## 2017-03-10 LAB — CMP14+EGFR
ALT: 19 IU/L (ref 0–32)
AST: 19 IU/L (ref 0–40)
Albumin/Globulin Ratio: 1.5 (ref 1.2–2.2)
Albumin: 4.7 g/dL (ref 3.5–4.8)
Alkaline Phosphatase: 80 IU/L (ref 39–117)
BUN/Creatinine Ratio: 31 — ABNORMAL HIGH (ref 12–28)
BUN: 22 mg/dL (ref 8–27)
Bilirubin Total: 0.6 mg/dL (ref 0.0–1.2)
CO2: 25 mmol/L (ref 20–29)
Calcium: 10.9 mg/dL — ABNORMAL HIGH (ref 8.7–10.3)
Chloride: 99 mmol/L (ref 96–106)
Creatinine, Ser: 0.71 mg/dL (ref 0.57–1.00)
GFR calc Af Amer: 96 mL/min/{1.73_m2} (ref >59)
GFR calc non Af Amer: 84 mL/min/{1.73_m2} (ref >59)
Globulin, Total: 3.1 g/dL (ref 1.5–4.5)
Glucose: 96 mg/dL (ref 65–99)
Potassium: 4 mmol/L (ref 3.5–5.2)
Sodium: 141 mmol/L (ref 134–144)
Total Protein: 7.8 g/dL (ref 6.0–8.5)

## 2017-03-17 ENCOUNTER — Ambulatory Visit (INDEPENDENT_AMBULATORY_CARE_PROVIDER_SITE_OTHER): Payer: Medicare Other | Admitting: Pediatrics

## 2017-03-17 ENCOUNTER — Encounter: Payer: Self-pay | Admitting: Pediatrics

## 2017-03-17 ENCOUNTER — Ambulatory Visit: Payer: Medicare Other | Admitting: Pediatrics

## 2017-03-17 VITALS — BP 119/80 | HR 74 | Temp 98.1°F | Ht 59.0 in | Wt 204.4 lb

## 2017-03-17 DIAGNOSIS — B372 Candidiasis of skin and nail: Secondary | ICD-10-CM | POA: Diagnosis not present

## 2017-03-17 MED ORDER — NYSTATIN 100000 UNIT/GM EX POWD: CUTANEOUS | 2 refills | Status: DC

## 2017-03-17 NOTE — Progress Notes (Signed)
  Subjective:   Patient ID: Sheila [name], female    DOB: 1940/05/28, 77 y.o.   MRN: [id number] CC: pain on skin near abd HPI: Sheila [name] is a 77 y.o. female presenting for skin tear  Noticed tenderness and a smell to the skin on R underside of pannus two days ago Told daughter about it last night, scheduled appt today to be seen  Has had similar place on L side of abdomen that improved with nystatin powder At one point tried nystatin ointment, made it worse  No fevers, feeling well otherwise  Relevant past medical, surgical, family and social history reviewed. Allergies and medications reviewed and updated. Social History   Tobacco Use  Smoking Status Former Smoker  . Types: Cigarettes  . Last attempt to quit: 02/24/1968  . Years since quitting: 49.0  Smokeless Tobacco Current User  . Types: Snuff  Tobacco Comment   quit yrs and yrs ago when she was very young   ROS: Per HPI   Objective:    BP 119/80   Pulse 74   Temp 98.1 F (36.7 C) (Oral)   Ht 4\' 11"  (1.499 m)   Wt 204 lb 6 oz (92.7 kg)   BMI 41.28 kg/m   Wt Readings from Last 3 Encounters:  03/17/17 204 lb 6 oz (92.7 kg)  03/09/17 203 lb 3.2 oz (92.2 kg)  03/04/17 207 lb (93.9 kg)    Gen: NAD, alert, cooperative with exam, NCAT EYES: EOMI, no conjunctival injection, or no icterus CV: NRRR, normal S1/S2, no murmur, distal pulses 2+ b/l Resp: CTABL, no wheezes, normal WOB Abd: +BS, soft Neuro: Alert and oriented MSK: normal muscle bulk Skin: slight reddening for 1-2cm along portions of pannus underside b/l, skin mildly ttp along R side  Assessment & Plan:  76yoF with candidal infection under her pannus  Diagnoses and all orders for this visit:  Candidal skin infection Keep area dry, avoid skin on skin with thin tshirt -     nystatin (MYCOSTATIN/NYSTOP) powder; APPLY 4 TIMES A DAY TO AFFECTED AREAS   Follow up plan: Return if symptoms worsen or fail to improve. Assunta Found, MD [place]

## 2017-03-25 ENCOUNTER — Other Ambulatory Visit: Payer: Self-pay | Admitting: Family

## 2017-04-08 ENCOUNTER — Encounter: Payer: Self-pay | Admitting: Family

## 2017-04-08 ENCOUNTER — Ambulatory Visit (INDEPENDENT_AMBULATORY_CARE_PROVIDER_SITE_OTHER): Payer: Medicare Other | Admitting: Family

## 2017-04-08 VITALS — BP 117/77 | HR 97 | Temp 97.5°F | Ht 59.0 in | Wt 203.0 lb

## 2017-04-08 DIAGNOSIS — S161XXA Strain of muscle, fascia and tendon at neck level, initial encounter: Secondary | ICD-10-CM

## 2017-04-08 MED ORDER — PREDNISONE 10 MG (21) PO TBPK: ORAL_TABLET | ORAL | 0 refills | Status: DC

## 2017-04-08 NOTE — Progress Notes (Signed)
   Subjective:    Patient ID: Sheila [name], female    DOB: [birth year], 77 y.o.   MRN: [id number]  Otalgia   There is pain in the left ear. This is a new problem. The current episode started in the past 7 days. The problem occurs every few minutes. The problem has been waxing and waning. There has been no fever. The pain is at a severity of 8/10. The pain is moderate. Associated symptoms include headaches and neck pain. Pertinent negatives include no coughing, diarrhea, ear discharge or sore throat. She has tried acetaminophen for the symptoms. The treatment provided mild relief.  Neck Pain   The current episode started in the past 7 days. The problem occurs intermittently. The problem has been waxing and waning. The pain is associated with nothing. The pain is at a severity of 8/10. The pain is moderate. Associated symptoms include headaches. She has tried acetaminophen for the symptoms. The treatment provided mild relief.      Review of Systems  HENT: Positive for ear pain. Negative for ear discharge and sore throat.   Respiratory: Negative for cough.   Gastrointestinal: Negative for diarrhea.  Musculoskeletal: Positive for neck pain.  Neurological: Positive for headaches.  All other systems reviewed and are negative.      Objective:   Physical Exam  Constitutional: She is oriented to person, place, and time. She appears well-developed and well-nourished. No distress.  HENT:  Head: Normocephalic and atraumatic.  Right Ear: External ear normal.  Left Ear: External ear normal.  Nose: Mucosal edema and rhinorrhea present.  Mouth/Throat: Oropharynx is clear and moist.  Eyes: Pupils are equal, round, and reactive to light.  Neck: Normal range of motion. Neck supple. No thyromegaly present.  Cardiovascular: Normal rate, regular rhythm, normal heart sounds and intact distal pulses.  No murmur heard. Pulmonary/Chest: Effort normal and breath sounds normal. No respiratory distress. She has  no wheezes.  Abdominal: Soft. Bowel sounds are normal. She exhibits no distension. There is no tenderness.  Musculoskeletal: She exhibits no edema or tenderness.  Pain in posterior neck with flexion, extension, and rotation  Neurological: She is alert and oriented to person, place, and time.  Skin: Skin is warm and dry.  Psychiatric: She has a normal mood and affect. Her behavior is normal. Judgment and thought content normal.  Vitals reviewed.    BP 117/77   Pulse 97   Temp (!) 97.5 F (36.4 C) (Oral)   Ht 4\' 11"  (1.499 m)   Wt 203 lb (92.1 kg)   BMI 41.00 kg/m      Assessment & Plan:  1. Strain of neck muscle, initial encounter Continue Ultram and tylenol as needed, can not  Have NSAID's related to HX of GI bleed Rest ROM exercises encouraged RTO prn  - predniSONE (STERAPRED UNI-PAK 21 TAB) 10 MG (21) TBPK tablet; Use as directed  Dispense: 21 tablet; Refill: 0    [name], FNP

## 2017-04-08 NOTE — Patient Instructions (Signed)

## 2017-04-23 ENCOUNTER — Other Ambulatory Visit: Payer: Self-pay | Admitting: Family

## 2017-05-12 ENCOUNTER — Other Ambulatory Visit: Payer: Self-pay | Admitting: Family

## 2017-05-22 ENCOUNTER — Other Ambulatory Visit: Payer: Self-pay | Admitting: Family

## 2017-06-03 ENCOUNTER — Ambulatory Visit (INDEPENDENT_AMBULATORY_CARE_PROVIDER_SITE_OTHER): Payer: Medicare Other | Admitting: Orthopaedic Surgery

## 2017-06-08 ENCOUNTER — Ambulatory Visit (INDEPENDENT_AMBULATORY_CARE_PROVIDER_SITE_OTHER): Payer: Medicare Other | Admitting: Family

## 2017-06-08 ENCOUNTER — Encounter: Payer: Self-pay | Admitting: Family

## 2017-06-08 VITALS — BP 130/81 | HR 92 | Temp 98.0°F | Ht 59.0 in | Wt 206.4 lb

## 2017-06-08 DIAGNOSIS — F331 Major depressive disorder, recurrent, moderate: Secondary | ICD-10-CM | POA: Diagnosis not present

## 2017-06-08 DIAGNOSIS — I10 Essential (primary) hypertension: Secondary | ICD-10-CM

## 2017-06-08 DIAGNOSIS — B372 Candidiasis of skin and nail: Secondary | ICD-10-CM | POA: Diagnosis not present

## 2017-06-08 DIAGNOSIS — F411 Generalized anxiety disorder: Secondary | ICD-10-CM

## 2017-06-08 DIAGNOSIS — M545 Low back pain: Secondary | ICD-10-CM

## 2017-06-08 DIAGNOSIS — M8949 Other hypertrophic osteoarthropathy, multiple sites: Secondary | ICD-10-CM

## 2017-06-08 DIAGNOSIS — G8929 Other chronic pain: Secondary | ICD-10-CM

## 2017-06-08 DIAGNOSIS — M150 Primary generalized (osteo)arthritis: Secondary | ICD-10-CM | POA: Diagnosis not present

## 2017-06-08 DIAGNOSIS — M159 Polyosteoarthritis, unspecified: Secondary | ICD-10-CM

## 2017-06-08 DIAGNOSIS — Z0289 Encounter for other administrative examinations: Secondary | ICD-10-CM | POA: Diagnosis not present

## 2017-06-08 DIAGNOSIS — F1120 Opioid dependence, uncomplicated: Secondary | ICD-10-CM | POA: Diagnosis not present

## 2017-06-08 DIAGNOSIS — E039 Hypothyroidism, unspecified: Secondary | ICD-10-CM

## 2017-06-08 DIAGNOSIS — E785 Hyperlipidemia, unspecified: Secondary | ICD-10-CM

## 2017-06-08 MED ORDER — NYSTATIN 100000 UNIT/GM EX POWD: CUTANEOUS | 2 refills | Status: DC

## 2017-06-08 MED ORDER — TRAMADOL HCL 50 MG PO TABS: 50.0000 mg | ORAL_TABLET | Freq: Two times a day (BID) | ORAL | 3 refills | Status: DC | PRN

## 2017-06-08 NOTE — Patient Instructions (Signed)
Skin Yeast Infection Skin yeast infection is a condition in which there is an overgrowth of yeast (candida) that normally lives on the skin. This condition usually occurs in areas of the skin that are constantly warm and moist, such as the armpits or the groin. What are the causes? This condition is caused by a change in the normal balance of the yeast and bacteria that live on the skin. What increases the risk? This condition is more likely to develop in:  People who are obese.  Pregnant women.  Women who take birth control pills.  People who have diabetes.  People who take antibiotic medicines.  People who take steroid medicines.  People who are malnourished.  People who have a weak defense (immune) system.  People who are 65 years of age or older.  What are the signs or symptoms? Symptoms of this condition include:  A red, swollen area of the skin.  Bumps on the skin.  Itchiness.  How is this diagnosed? This condition is diagnosed with a medical history and physical exam. Your health care provider may check for yeast by taking light scrapings of the skin to be viewed under a microscope. How is this treated? This condition is treated with medicine. Medicines may be prescribed or be available over-the-counter. The medicines may be:  Taken by mouth (orally).  Applied as a cream.  Follow these instructions at home:  Take or apply over-the-counter and prescription medicines only as told by your health care provider.  Eat more yogurt. This may help to keep your yeast infection from returning.  Maintain a healthy weight. If you need help losing weight, talk with your health care provider.  Keep your skin clean and dry.  If you have diabetes, keep your blood sugar under control. Contact a health care provider if:  Your symptoms go away and then return.  Your symptoms do not get better with treatment.  Your symptoms get worse.  Your rash spreads.  You have a  fever or chills.  You have new symptoms.  You have new warmth or redness of your skin. This information is not intended to replace advice given to you by your health care provider. Make sure you discuss any questions you have with your health care provider. Document Released: 10/28/2010 Document Revised: 10/06/2015 Document Reviewed: 08/13/2014 Elsevier Interactive Patient Education  2018 [employer]  

## 2017-06-08 NOTE — Progress Notes (Signed)
Subjective:    Patient ID: Sheila [name], female    DOB: June 15, 1940, 77 y.o.   MRN: [id number]  Pt presents to the office today for chronic follow up. PT is followed by Ortho for osteoarthritis.   Depression         This is a chronic problem.  The current episode started more than 1 year ago.   The onset quality is gradual.   The problem occurs intermittently.  The problem has been waxing and waning since onset.  Associated symptoms include fatigue, insomnia, irritable, decreased interest and sad.  Associated symptoms include no helplessness and no hopelessness.  Past treatments include SSRIs - Selective serotonin reuptake inhibitors.  Past medical history includes thyroid problem and anxiety.   Anxiety  Presents for follow-up visit. Symptoms include depressed mood, excessive worry, insomnia and nervous/anxious behavior. Patient reports no shortness of breath. Symptoms occur occasionally. The severity of symptoms is moderate.    Back Pain  This is a chronic problem. The current episode started more than 1 year ago. The problem occurs intermittently. The problem has been waxing and waning since onset. The pain is present in the lumbar spine. The pain is at a severity of 2/10.  Hypertension  This is a chronic problem. The current episode started more than 1 year ago. The problem has been resolved since onset. The problem is controlled. Associated symptoms include anxiety and malaise/fatigue. Pertinent negatives include no peripheral edema or shortness of breath. Risk factors for coronary artery disease include dyslipidemia and obesity. Identifiable causes of hypertension include a thyroid problem.  Thyroid Problem  Presents for follow-up visit. Symptoms include anxiety, depressed mood and fatigue. The symptoms have been stable.  Arthritis  Presents for follow-up visit. She complains of pain and stiffness. The symptoms have been stable. Affected locations include the right knee and left knee. Her  pain is at a severity of 1/10. Associated symptoms include fatigue and rash.  Rash  This is a recurrent problem. The current episode started 1 to 4 weeks ago. The problem has been waxing and waning since onset. The affected locations include the left buttock. The rash is characterized by dryness and redness. Associated symptoms include fatigue. Pertinent negatives include no shortness of breath. Past treatments include nothing.      Review of Systems  Constitutional: Positive for fatigue and malaise/fatigue.  Respiratory: Negative for shortness of breath.   Musculoskeletal: Positive for arthritis, back pain and stiffness.  Skin: Positive for rash.  Psychiatric/Behavioral: Positive for depression. The patient is nervous/anxious and has insomnia.   All other systems reviewed and are negative.      Objective:   Physical Exam  Constitutional: She is oriented to person, place, and time. She appears well-developed and well-nourished. She is irritable. No distress.  Morbid obese   HENT:  Head: Normocephalic and atraumatic.  Right Ear: External ear normal.  Left Ear: External ear normal.  Nose: Nose normal.  Mouth/Throat: Oropharynx is clear and moist.  Eyes: Pupils are equal, round, and reactive to light.  Neck: Normal range of motion. Neck supple. No thyromegaly present.  Cardiovascular: Normal rate, regular rhythm, normal heart sounds and intact distal pulses.  Pulmonary/Chest: Effort normal and breath sounds normal. No respiratory distress. She has no wheezes.  Abdominal: Soft. Bowel sounds are normal. She exhibits no distension. There is no tenderness.  Musculoskeletal: Normal range of motion. She exhibits no edema or tenderness.  Neurological: She is alert and oriented to person, place, and time.  Skin: Skin is warm and dry. Rash noted.  Erythemas yeast rash in between gluteal  folds  Psychiatric: She has a normal mood and affect. Her behavior is normal. Judgment and thought  content normal.  Vitals reviewed.     BP 130/81   Pulse 92   Temp 98 F (36.7 C) (Oral)   Ht '4\' 11"'$  (1.499 m)   Wt 206 lb 6.4 oz (93.6 kg)   BMI 41.69 kg/m      Assessment & Plan:  1. Essential hypertension, benign - CMP14+EGFR  2. Hypothyroidism, unspecified type - CMP14+EGFR - TSH  3. Primary osteoarthritis involving multiple joints - CMP14+EGFR - traMADol (ULTRAM) 50 MG tablet; Take 1 tablet (50 mg total) by mouth every 12 (twelve) hours as needed.  Dispense: 60 tablet; Refill: 3  4. Pain medication agreement signed - CMP14+EGFR  5. Uncomplicated opioid dependence ([place]) - CMP14+EGFR  6. Morbid obesity ([place]) - CMP14+EGFR  7. Hyperlipidemia, unspecified hyperlipidemia type  - CMP14+EGFR - Lipid panel  8. GAD (generalized anxiety disorder)  - CMP14+EGFR  9. Moderate episode of recurrent major depressive disorder (HCC) - CMP14+EGFR  10. Chronic bilateral low back pain, with sciatica presence unspecified  - CMP14+EGFR - traMADol (ULTRAM) 50 MG tablet; Take 1 tablet (50 mg total) by mouth every 12 (twelve) hours as needed.  Dispense: 60 tablet; Refill: 3  11. Candidal skin infection   12. Yeast dermatitis  - CMP14+EGFR - nystatin (MYCOSTATIN/NYSTOP) powder; APPLY 4 TIMES A DAY TO AFFECTED AREAS  Dispense: 30 g; Refill: 2   Continue all meds Labs pending Health Maintenance reviewed Diet and exercise encouraged RTO 3 months   [name], FNP

## 2017-06-09 LAB — LIPID PANEL
Chol/HDL Ratio: 4.4 ratio (ref 0.0–4.4)
Cholesterol, Total: 191 mg/dL (ref 100–199)
HDL: 43 mg/dL (ref >39)
LDL Calculated: 107 mg/dL — ABNORMAL HIGH (ref 0–99)
Triglycerides: 207 mg/dL — ABNORMAL HIGH (ref 0–149)
VLDL Cholesterol Cal: 41 mg/dL — ABNORMAL HIGH (ref 5–40)

## 2017-06-09 LAB — CMP14+EGFR
ALT: 18 IU/L (ref 0–32)
AST: 22 IU/L (ref 0–40)
Albumin/Globulin Ratio: 1.5 (ref 1.2–2.2)
Albumin: 4.4 g/dL (ref 3.5–4.8)
Alkaline Phosphatase: 84 IU/L (ref 39–117)
BUN/Creatinine Ratio: 24 (ref 12–28)
BUN: 18 mg/dL (ref 8–27)
Bilirubin Total: 0.6 mg/dL (ref 0.0–1.2)
CO2: 23 mmol/L (ref 20–29)
Calcium: 10.6 mg/dL — ABNORMAL HIGH (ref 8.7–10.3)
Chloride: 100 mmol/L (ref 96–106)
Creatinine, Ser: 0.74 mg/dL (ref 0.57–1.00)
GFR calc Af Amer: 92 mL/min/{1.73_m2} (ref >59)
GFR calc non Af Amer: 80 mL/min/{1.73_m2} (ref >59)
Globulin, Total: 3 g/dL (ref 1.5–4.5)
Glucose: 114 mg/dL — ABNORMAL HIGH (ref 65–99)
Potassium: 4.2 mmol/L (ref 3.5–5.2)
Sodium: 140 mmol/L (ref 134–144)
Total Protein: 7.4 g/dL (ref 6.0–8.5)

## 2017-06-09 LAB — TSH: TSH: 2.43 u[IU]/mL (ref 0.450–4.500)

## 2017-06-10 ENCOUNTER — Other Ambulatory Visit: Payer: Self-pay | Admitting: Family

## 2017-06-23 ENCOUNTER — Other Ambulatory Visit (INDEPENDENT_AMBULATORY_CARE_PROVIDER_SITE_OTHER): Payer: Self-pay | Admitting: Internal Medicine

## 2017-06-23 ENCOUNTER — Other Ambulatory Visit: Payer: Self-pay | Admitting: Family

## 2017-06-23 NOTE — Telephone Encounter (Signed)
Last seen 06/08/17  Sheila [name] 

## 2017-06-29 ENCOUNTER — Other Ambulatory Visit: Payer: Self-pay | Admitting: Family

## 2017-06-29 DIAGNOSIS — F411 Generalized anxiety disorder: Secondary | ICD-10-CM

## 2017-06-29 DIAGNOSIS — F331 Major depressive disorder, recurrent, moderate: Secondary | ICD-10-CM

## 2017-07-13 DIAGNOSIS — M4316 Spondylolisthesis, lumbar region: Secondary | ICD-10-CM | POA: Diagnosis not present

## 2017-07-13 DIAGNOSIS — I10 Essential (primary) hypertension: Secondary | ICD-10-CM | POA: Diagnosis not present

## 2017-07-20 ENCOUNTER — Other Ambulatory Visit: Payer: Self-pay | Admitting: Family

## 2017-07-23 ENCOUNTER — Other Ambulatory Visit: Payer: Self-pay | Admitting: Family

## 2017-07-26 ENCOUNTER — Other Ambulatory Visit: Payer: Self-pay | Admitting: Family

## 2017-07-26 ENCOUNTER — Ambulatory Visit (INDEPENDENT_AMBULATORY_CARE_PROVIDER_SITE_OTHER): Payer: Medicare Other | Admitting: Family Medicine

## 2017-07-26 ENCOUNTER — Encounter: Payer: Self-pay | Admitting: Family Medicine

## 2017-07-26 ENCOUNTER — Ambulatory Visit (INDEPENDENT_AMBULATORY_CARE_PROVIDER_SITE_OTHER): Payer: Medicare Other

## 2017-07-26 VITALS — BP 130/84 | HR 104 | Temp 98.7°F | Ht 59.0 in | Wt 203.0 lb

## 2017-07-26 DIAGNOSIS — R0602 Shortness of breath: Secondary | ICD-10-CM

## 2017-07-26 DIAGNOSIS — J441 Chronic obstructive pulmonary disease with (acute) exacerbation: Secondary | ICD-10-CM | POA: Diagnosis not present

## 2017-07-26 MED ORDER — LEVOFLOXACIN 500 MG PO TABS: 500.0000 mg | ORAL_TABLET | Freq: Every day | ORAL | 0 refills | Status: DC

## 2017-07-26 MED ORDER — BETAMETHASONE SOD PHOS & ACET 6 (3-3) MG/ML IJ SUSP
6.0000 mg | Freq: Once | INTRAMUSCULAR | Status: AC
  Administered 2017-07-26: 6 mg via INTRAMUSCULAR

## 2017-07-26 MED ORDER — PREDNISONE 10 MG PO TABS: ORAL_TABLET | ORAL | 0 refills | Status: DC

## 2017-07-26 NOTE — Addendum Note (Signed)
Addended by: Marylin Crosby on: 07/26/2017 03:14 PM   Modules accepted: Orders

## 2017-07-26 NOTE — Progress Notes (Signed)
Chief Complaint  Patient presents with  . Cough    pt here today c/o bad cough x 3 days    HPI  Patient presents today for Patient presents with upper respiratory congestion. There is moderate sore throat. Patient reports coughing frequently as well.  Thick white to yellow sputum noted. There is no fever, chills or sweats. The patient denies being short of breath.  However she has noticed a lot of wheezing.  Onset was 3 days ago. Gradually worsening.  PMH: Smoking status noted Review of Systems  Constitutional: Positive for activity change and appetite change (Decreased). Negative for chills and fever.  HENT: Positive for congestion, postnasal drip and rhinorrhea. Negative for ear discharge, ear pain, hearing loss, nosebleeds, sinus pressure, sneezing and trouble swallowing.   Respiratory: Positive for chest tightness and wheezing. Negative for shortness of breath.   Cardiovascular: Negative for chest pain and palpitations.  Gastrointestinal: Negative.   Skin: Negative for rash.    Objective: BP 130/84   Pulse (!) 104   Temp 98.7 F (37.1 C) (Oral)   Ht 4\' 11"  (1.499 m)   Wt 203 lb (92.1 kg)   SpO2 97%   BMI 41.00 kg/m  Gen: NAD, alert, cooperative with exam HEENT: NCAT, Nasal passages swollen, red TMS clear  CV: RRR, good S1/S2, no murmur Resp: Coarse wheezing and rhonchi throughout all lung fields  Ext: No edema, warm Neuro: Alert and oriented, No gross deficits Nebule of Xopenex given with some loosening of the wheezing and rhonchi and improvement of overall exchange noted. Chest x-ray: No acute infiltrate, effusion or cardiomegaly noted. Assessment and plan:  1. Shortness of breath   2. 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   Orders Placed This Encounter  Procedures  . DG Chest 2 View    Standing Status:   Future    Number of Occurrences:   1    Standing  Expiration Date:   09/25/2018    Order Specific Question:   Reason for Exam (SYMPTOM  OR DIAGNOSIS REQUIRED)    Answer:   Dyspnea    Order Specific Question:   Preferred imaging location?    Answer:   Internal    Follow up as needed.  Claretta Fraise, MD

## 2017-07-27 ENCOUNTER — Ambulatory Visit: Payer: Medicare Other | Admitting: Family Medicine

## 2017-07-30 ENCOUNTER — Other Ambulatory Visit: Payer: Self-pay | Admitting: Family

## 2017-07-30 NOTE — Telephone Encounter (Signed)
LMOVM pt will need an appointment with [name] for the order to be placed for documentation purposes

## 2017-08-09 ENCOUNTER — Ambulatory Visit (INDEPENDENT_AMBULATORY_CARE_PROVIDER_SITE_OTHER): Payer: Medicare Other | Admitting: Family

## 2017-08-09 ENCOUNTER — Encounter: Payer: Self-pay | Admitting: Family

## 2017-08-09 VITALS — BP 137/88 | HR 93 | Temp 97.1°F | Ht 59.0 in | Wt 203.2 lb

## 2017-08-09 DIAGNOSIS — G8929 Other chronic pain: Secondary | ICD-10-CM | POA: Diagnosis not present

## 2017-08-09 DIAGNOSIS — M25561 Pain in right knee: Secondary | ICD-10-CM

## 2017-08-09 DIAGNOSIS — R300 Dysuria: Secondary | ICD-10-CM

## 2017-08-09 DIAGNOSIS — M170 Bilateral primary osteoarthritis of knee: Secondary | ICD-10-CM

## 2017-08-09 DIAGNOSIS — M25562 Pain in left knee: Secondary | ICD-10-CM | POA: Diagnosis not present

## 2017-08-09 DIAGNOSIS — N3001 Acute cystitis with hematuria: Secondary | ICD-10-CM

## 2017-08-09 LAB — URINALYSIS, COMPLETE
Bilirubin, UA: NEGATIVE
Glucose, UA: NEGATIVE
Nitrite, UA: NEGATIVE
Specific Gravity, UA: 1.02 (ref 1.005–1.030)
Urobilinogen, Ur: 1 mg/dL (ref 0.2–1.0)
pH, UA: 6 (ref 5.0–7.5)

## 2017-08-09 LAB — MICROSCOPIC EXAMINATION: Renal Epithel, UA: NONE SEEN /hpf

## 2017-08-09 MED ORDER — SULFAMETHOXAZOLE-TRIMETHOPRIM 800-160 MG PO TABS: 1.0000 | ORAL_TABLET | Freq: Two times a day (BID) | ORAL | 0 refills | Status: DC

## 2017-08-09 NOTE — Progress Notes (Signed)
   Subjective:    Patient ID: Sheila [name], female    DOB: 04-14-1940, 77 y.o.   MRN: [id number]  Chief Complaint  Patient presents with  . face to face for stationary bike  . Dysuria    Dysuria   The current episode started in the past 7 days. The problem occurs every urination. The problem has been gradually worsening. The quality of the pain is described as burning. The pain is at a severity of 4/10. The pain is mild. There has been no fever. Associated symptoms include flank pain, frequency, hesitancy, nausea and urgency. Pertinent negatives include no chills, hematuria or vomiting. She has tried nothing for the symptoms. The treatment provided no relief.  Knee Pain    Pt has intermittent pain of 6 out 10. 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 It was recommended     Review of Systems  Constitutional: Negative for chills.  Gastrointestinal: Positive for nausea. Negative for vomiting.  Genitourinary: Positive for dysuria, flank pain, frequency, hesitancy and urgency. Negative for hematuria.  All other systems reviewed and are negative.      Objective:   Physical Exam  Constitutional: She is oriented to person, place, and time. She appears well-developed and well-nourished. No distress.  HENT:  Head: Normocephalic and atraumatic.  Right Ear: External ear normal.  Left Ear: External ear normal.  Mouth/Throat: Oropharynx is clear and moist.  Eyes: Pupils are equal, round, and reactive to light.  Neck: Normal range of motion. Neck supple. No thyromegaly present.  Cardiovascular: Normal rate, regular rhythm, normal heart sounds and intact distal pulses.  No murmur heard. Pulmonary/Chest: Effort normal and breath sounds normal. No respiratory distress. She has no wheezes.  Abdominal: Soft. Bowel sounds are normal. She exhibits no distension. There is no tenderness.    Musculoskeletal: Normal range of motion. She exhibits no edema or tenderness.  Neurological: She is alert and oriented to person, place, and time. She has normal reflexes. No cranial nerve deficit.  Skin: Skin is warm and dry.  Psychiatric: She has a normal mood and affect. Her behavior is normal. Judgment and thought content normal.  Vitals reviewed.    BP 137/88   Pulse 93   Temp (!) 97.1 F (36.2 C) (Oral)   Ht 4\' 11"  (1.499 m)   Wt 203 lb 3.2 oz (92.2 kg)   BMI 41.04 kg/m      Assessment & Plan:  Carlina was seen today for face to face for stationary bike and dysuria.  Diagnoses and all orders for this visit:  Dysuria -     Urinalysis, Complete  Chronic pain of both knees  Primary osteoarthritis of both knees  Acute cystitis with hematuria -     sulfamethoxazole-trimethoprim (BACTRIM DS) 800-160 MG tablet; Take 1 tablet by mouth 2 (two) times daily. -     Urine Culture  Force fluids RTO prn Culture pending RX given for stationary bike to help with knee pain and exercising    [name], FNP

## 2017-08-09 NOTE — Patient Instructions (Signed)

## 2017-08-13 ENCOUNTER — Telehealth: Payer: Self-pay | Admitting: Family

## 2017-08-13 LAB — URINE CULTURE

## 2017-08-13 NOTE — Telephone Encounter (Signed)
Aware of culture results

## 2017-08-18 ENCOUNTER — Other Ambulatory Visit: Payer: Self-pay | Admitting: Family

## 2017-09-09 ENCOUNTER — Ambulatory Visit (INDEPENDENT_AMBULATORY_CARE_PROVIDER_SITE_OTHER): Payer: Medicare Other | Admitting: Family

## 2017-09-09 ENCOUNTER — Encounter: Payer: Self-pay | Admitting: Family

## 2017-09-09 VITALS — BP 132/84 | HR 99 | Temp 98.5°F | Ht 59.0 in | Wt 203.4 lb

## 2017-09-09 DIAGNOSIS — E785 Hyperlipidemia, unspecified: Secondary | ICD-10-CM | POA: Diagnosis not present

## 2017-09-09 DIAGNOSIS — I10 Essential (primary) hypertension: Secondary | ICD-10-CM | POA: Diagnosis not present

## 2017-09-09 DIAGNOSIS — E8881 Metabolic syndrome: Secondary | ICD-10-CM

## 2017-09-09 DIAGNOSIS — F331 Major depressive disorder, recurrent, moderate: Secondary | ICD-10-CM | POA: Diagnosis not present

## 2017-09-09 DIAGNOSIS — R350 Frequency of micturition: Secondary | ICD-10-CM

## 2017-09-09 DIAGNOSIS — M150 Primary generalized (osteo)arthritis: Secondary | ICD-10-CM

## 2017-09-09 DIAGNOSIS — G629 Polyneuropathy, unspecified: Secondary | ICD-10-CM

## 2017-09-09 DIAGNOSIS — G8929 Other chronic pain: Secondary | ICD-10-CM | POA: Diagnosis not present

## 2017-09-09 DIAGNOSIS — F1120 Opioid dependence, uncomplicated: Secondary | ICD-10-CM | POA: Diagnosis not present

## 2017-09-09 DIAGNOSIS — E039 Hypothyroidism, unspecified: Secondary | ICD-10-CM | POA: Diagnosis not present

## 2017-09-09 DIAGNOSIS — J449 Chronic obstructive pulmonary disease, unspecified: Secondary | ICD-10-CM

## 2017-09-09 DIAGNOSIS — M159 Polyosteoarthritis, unspecified: Secondary | ICD-10-CM

## 2017-09-09 DIAGNOSIS — M545 Low back pain: Secondary | ICD-10-CM | POA: Diagnosis not present

## 2017-09-09 DIAGNOSIS — F411 Generalized anxiety disorder: Secondary | ICD-10-CM

## 2017-09-09 DIAGNOSIS — Z0289 Encounter for other administrative examinations: Secondary | ICD-10-CM

## 2017-09-09 DIAGNOSIS — M8949 Other hypertrophic osteoarthropathy, multiple sites: Secondary | ICD-10-CM

## 2017-09-09 LAB — CMP14+EGFR
ALT: 14 IU/L (ref 0–32)
AST: 23 IU/L (ref 0–40)
Albumin/Globulin Ratio: 1.6 (ref 1.2–2.2)
Albumin: 4.5 g/dL (ref 3.5–4.8)
Alkaline Phosphatase: 75 IU/L (ref 39–117)
BUN/Creatinine Ratio: 15 (ref 12–28)
BUN: 12 mg/dL (ref 8–27)
Bilirubin Total: 0.9 mg/dL (ref 0.0–1.2)
CO2: 22 mmol/L (ref 20–29)
Calcium: 11.3 mg/dL — ABNORMAL HIGH (ref 8.7–10.3)
Chloride: 96 mmol/L (ref 96–106)
Creatinine, Ser: 0.82 mg/dL (ref 0.57–1.00)
GFR calc Af Amer: 80 mL/min/{1.73_m2} (ref >59)
GFR calc non Af Amer: 70 mL/min/{1.73_m2} (ref >59)
Globulin, Total: 2.8 g/dL (ref 1.5–4.5)
Glucose: 100 mg/dL — ABNORMAL HIGH (ref 65–99)
Potassium: 4.3 mmol/L (ref 3.5–5.2)
Sodium: 139 mmol/L (ref 134–144)
Total Protein: 7.3 g/dL (ref 6.0–8.5)

## 2017-09-09 MED ORDER — TRAMADOL HCL 50 MG PO TABS
50.0000 mg | ORAL_TABLET | Freq: Two times a day (BID) | ORAL | 3 refills | Status: DC | PRN
Start: 2017-09-09 — End: 2017-12-09

## 2017-09-09 NOTE — Progress Notes (Signed)
Subjective:    Patient ID: Sheila [name], female    DOB: [birth year], 77 y.o.   MRN: [id number]  Chief Complaint  Patient presents with  . Medical Management of Chronic Issues    three month recheck    Thyroid Problem  Presents for follow-up visit. Symptoms include anxiety, depressed mood and fatigue. Patient reports no diarrhea. The symptoms have been stable. Her past medical history is significant for hyperlipidemia. There is no history of heart failure.  Hyperlipidemia  This is a chronic problem. The current episode started more than 1 year ago. The problem is uncontrolled. Recent lipid tests were reviewed and are high. Exacerbating diseases include obesity. Associated symptoms include shortness of breath (when walking distances). Current antihyperlipidemic treatment includes statins. The current treatment provides moderate improvement of lipids. Risk factors for coronary artery disease include dyslipidemia, hypertension and a sedentary lifestyle.  Hypertension  This is a chronic problem. The current episode started more than 1 year ago. The problem has been resolved since onset. The problem is controlled. Associated symptoms include anxiety, malaise/fatigue and shortness of breath (when walking distances). Pertinent negatives include no peripheral edema. Risk factors for coronary artery disease include dyslipidemia and obesity. The current treatment provides moderate improvement. There is no history of kidney disease, CAD/MI, CVA or heart failure. Identifiable causes of hypertension include a thyroid problem.  Arthritis  Presents for follow-up visit. She complains of pain and stiffness. The symptoms have been stable. Affected locations include the right knee and left knee (back). Her pain is at a severity of 1/10. Associated symptoms include fatigue. Pertinent negatives include no diarrhea.  Depression         This is a chronic problem.  The current episode started more than 1 year ago.   The  onset quality is gradual.   The problem occurs intermittently.  The problem has been waxing and waning since onset.  Associated symptoms include fatigue.  Associated symptoms include no helplessness, no hopelessness, not irritable, no restlessness, no decreased interest and not sad.  Past treatments include SNRIs - Serotonin and norepinephrine reuptake inhibitors.  Past medical history includes thyroid problem and anxiety.   Anxiety  Presents for follow-up visit. Symptoms include depressed mood, excessive worry, irritability, nervous/anxious behavior and shortness of breath (when walking distances). Patient reports no restlessness. Symptoms occur occasionally. The severity of symptoms is moderate. The quality of sleep is good.    Metabolic Syndrome PT states she has started using her stationary bike daily for 10 mins. Takes her statin daily.  COPD PT states she uses the Symbicort daily and "sometimes" twice when I needed it.    Review of Systems  Constitutional: Positive for fatigue, irritability and malaise/fatigue.  Respiratory: Positive for shortness of breath (when walking distances).   Gastrointestinal: Negative for diarrhea.  Musculoskeletal: Positive for arthritis and stiffness.  Psychiatric/Behavioral: Positive for depression. The patient is nervous/anxious.   All other systems reviewed and are negative.      Objective:   Physical Exam  Constitutional: She is oriented to person, place, and time. She appears well-developed and well-nourished. She is not irritable. No distress.  HENT:  Head: Normocephalic and atraumatic.  Right Ear: External ear normal.  Left Ear: External ear normal.  Mouth/Throat: Oropharynx is clear and moist.  Eyes: Pupils are equal, round, and reactive to light.  Neck: Normal range of motion. Neck supple. No thyromegaly present.  Cardiovascular: Normal rate, regular rhythm, normal heart sounds and intact distal pulses.  No murmur  heard. Pulmonary/Chest:  Effort normal. No respiratory distress. She has wheezes.  Abdominal: Soft. Bowel sounds are normal. She exhibits no distension. There is no tenderness.  Musculoskeletal: Normal range of motion. She exhibits no edema or tenderness.  Neurological: She is alert and oriented to person, place, and time. She has normal reflexes. No cranial nerve deficit.  Skin: Skin is warm and dry.  Psychiatric: She has a normal mood and affect. Her behavior is normal. Judgment and thought content normal.  Vitals reviewed.     BP 132/84   Pulse 99   Temp 98.5 F (36.9 C) (Oral)   Ht '4\' 11"'$  (1.499 m)   Wt 203 lb 6.4 oz (92.3 kg)   BMI 41.08 kg/m      Assessment & Plan:  Sheila [name] comes in today with chief complaint of Medical Management of Chronic Issues (three month recheck)   Diagnosis and orders addressed:  1. Chronic bilateral low back pain, with sciatica presence unspecified - CMP14+EGFR - traMADol (ULTRAM) 50 MG tablet; Take 1 tablet (50 mg total) by mouth every 12 (twelve) hours as needed.  Dispense: 60 tablet; Refill: 3  2. Moderate episode of recurrent major depressive disorder (HCC) - CMP14+EGFR  3. Essential hypertension, benign - CMP14+EGFR  4. GAD (generalized anxiety disorder) - CMP14+EGFR  5. Hyperlipidemia, unspecified hyperlipidemia type - CMP14+EGFR  6. Hypothyroidism, unspecified type - CMP14+EGFR  7. Morbid obesity (HCC) - CMP14+EGFR  8. Pain medication agreement signed - CMP14+EGFR  9. Primary osteoarthritis involving multiple joints - CMP14+EGFR - traMADol (ULTRAM) 50 MG tablet; Take 1 tablet (50 mg total) by mouth every 12 (twelve) hours as needed.  Dispense: 60 tablet; Refill: 3  10. Uncomplicated opioid dependence ([place]) - CMP14+EGFR  11. Neuropathy - CMP14+EGFR  12. Metabolic syndrome - ULA45+XMIW  13. Chronic obstructive pulmonary disease, unspecified COPD type ([place]) - CMP14+EGFR  14. Urinary frequency - CMP14+EGFR - Urinalysis,  Complete   Labs pending Health Maintenance reviewed Diet and exercise encouraged  Follow up plan: 3 months

## 2017-09-09 NOTE — Patient Instructions (Signed)

## 2017-09-10 ENCOUNTER — Other Ambulatory Visit: Payer: Self-pay | Admitting: Family

## 2017-09-10 ENCOUNTER — Other Ambulatory Visit: Payer: Medicare Other

## 2017-09-10 DIAGNOSIS — R350 Frequency of micturition: Secondary | ICD-10-CM | POA: Diagnosis not present

## 2017-09-10 LAB — URINALYSIS, COMPLETE
Bilirubin, UA: NEGATIVE
Glucose, UA: NEGATIVE
Ketones, UA: NEGATIVE
Nitrite, UA: NEGATIVE
Protein, UA: NEGATIVE
Specific Gravity, UA: 1.02 (ref 1.005–1.030)
Urobilinogen, Ur: 0.2 mg/dL (ref 0.2–1.0)
pH, UA: 6 (ref 5.0–7.5)

## 2017-09-10 LAB — MICROSCOPIC EXAMINATION

## 2017-09-10 MED ORDER — FLUCONAZOLE 150 MG PO TABS: 150.0000 mg | ORAL_TABLET | ORAL | 0 refills | Status: DC | PRN

## 2017-09-17 ENCOUNTER — Other Ambulatory Visit: Payer: Self-pay | Admitting: Family

## 2017-09-17 DIAGNOSIS — F411 Generalized anxiety disorder: Secondary | ICD-10-CM

## 2017-09-17 DIAGNOSIS — F331 Major depressive disorder, recurrent, moderate: Secondary | ICD-10-CM

## 2017-09-17 NOTE — Telephone Encounter (Signed)
Last seen 09/09/17

## 2017-10-01 ENCOUNTER — Telehealth: Payer: Self-pay | Admitting: Family

## 2017-10-01 NOTE — Telephone Encounter (Signed)
Ok to schedule.

## 2017-10-11 ENCOUNTER — Other Ambulatory Visit: Payer: Self-pay | Admitting: Family

## 2017-10-15 ENCOUNTER — Other Ambulatory Visit: Payer: Self-pay | Admitting: Family

## 2017-10-28 ENCOUNTER — Encounter: Payer: Self-pay | Admitting: Family

## 2017-10-28 ENCOUNTER — Ambulatory Visit (INDEPENDENT_AMBULATORY_CARE_PROVIDER_SITE_OTHER): Payer: Medicare Other | Admitting: Family

## 2017-10-28 VITALS — BP 164/97 | HR 106 | Temp 100.7°F | Ht 59.0 in | Wt 201.4 lb

## 2017-10-28 DIAGNOSIS — J320 Chronic maxillary sinusitis: Secondary | ICD-10-CM

## 2017-10-28 DIAGNOSIS — F411 Generalized anxiety disorder: Secondary | ICD-10-CM | POA: Diagnosis not present

## 2017-10-28 MED ORDER — DULOXETINE HCL 60 MG PO CPEP: 60.0000 mg | ORAL_CAPSULE | Freq: Every day | ORAL | 1 refills | Status: DC

## 2017-10-28 MED ORDER — AMOXICILLIN-POT CLAVULANATE 875-125 MG PO TABS: 1.0000 | ORAL_TABLET | Freq: Two times a day (BID) | ORAL | 0 refills | Status: DC

## 2017-10-28 NOTE — Patient Instructions (Signed)

## 2017-10-28 NOTE — Progress Notes (Signed)
Subjective:    Patient ID: Sheila [name], female    DOB: Dec 30, 1940, 77 y.o.   MRN: [id number]  Chief Complaint  Patient presents with  . Facial Pain  . Ear Pain  . cough and congestion  . Fever    Sinusitis  This is a new problem. The current episode started 1 to 4 weeks ago. The problem has been rapidly worsening since onset. The maximum temperature recorded prior to her arrival was 100.4 - 100.9 F. Her pain is at a severity of 8/10. The pain is moderate. Associated symptoms include chills, congestion, coughing, ear pain, headaches, a hoarse voice, sinus pressure, sneezing and a sore throat. Past treatments include acetaminophen and oral decongestants. The treatment provided mild relief.  Anxiety  Presents for follow-up visit. Symptoms include excessive worry, irritability, nervous/anxious behavior and restlessness. Symptoms occur most days. The severity of symptoms is causing significant distress and severe. The quality of sleep is good.        Review of Systems  Constitutional: Positive for chills and irritability.  HENT: Positive for congestion, ear pain, hoarse voice, sinus pressure, sneezing and sore throat.   Respiratory: Positive for cough.   Neurological: Positive for headaches.  Psychiatric/Behavioral: The patient is nervous/anxious.   All other systems reviewed and are negative.      Objective:   Physical Exam  Constitutional: She is oriented to person, place, and time. She appears well-developed and well-nourished. No distress.  HENT:  Head: Normocephalic and atraumatic.  Right Ear: External ear normal.  Nose: Mucosal edema and rhinorrhea present. Right sinus exhibits maxillary sinus tenderness and frontal sinus tenderness. Left sinus exhibits maxillary sinus tenderness and frontal sinus tenderness.  Mouth/Throat: Oropharynx is clear and moist.  Eyes: Pupils are equal, round, and reactive to light.  Neck: Normal range of motion. Neck supple. No thyromegaly  present.  Cardiovascular: Normal rate, regular rhythm, normal heart sounds and intact distal pulses.  No murmur heard. Pulmonary/Chest: Effort normal. No respiratory distress. She has wheezes.  Abdominal: Soft. Bowel sounds are normal. She exhibits no distension. There is no tenderness.  Musculoskeletal: Normal range of motion. She exhibits no edema or tenderness.  Neurological: She is alert and oriented to person, place, and time. She has normal reflexes. No cranial nerve deficit.  Skin: Skin is warm and dry.  Psychiatric: Her behavior is normal. Judgment and thought content normal. Her mood appears anxious.  Vitals reviewed.     BP (!) 164/97   Pulse (!) 106   Temp (!) 100.7 F (38.2 C) (Oral)   Ht 4\' 11"  (1.499 m)   Wt 201 lb 6.4 oz (91.4 kg)   BMI 40.68 kg/m      Assessment & Plan:  Sheila [name] comes in today with chief complaint of Facial Pain; Ear Pain; cough and congestion; and Fever   Diagnosis and orders addressed:  1. Maxillary sinusitis, unspecified chronicity - Take meds as prescribed - Use a cool mist humidifier  -Use saline nose sprays frequently -Force fluids -For any cough or congestion  Use plain Mucinex- regular strength or max strength is fine -For fever or aces or pains- take tylenol or ibuprofen. -Throat lozenges if help -New toothbrush in 3 days - amoxicillin-clavulanate (AUGMENTIN) 875-125 MG tablet; Take 1 tablet by mouth 2 (two) times daily.  Dispense: 14 tablet; Refill: 0  2. GAD (generalized anxiety disorder) Cymbalta increased to 60 mg from 30 mg  Stress management discussed RTO in 6 weeks  - DULoxetine (CYMBALTA)  60 MG capsule; Take 1 capsule (60 mg total) by mouth daily.  Dispense: 90 capsule; Refill: [place], FNP

## 2017-11-09 ENCOUNTER — Other Ambulatory Visit: Payer: Self-pay | Admitting: Family

## 2017-11-10 ENCOUNTER — Telehealth: Payer: Self-pay | Admitting: Family

## 2017-11-10 NOTE — Telephone Encounter (Signed)
Patient needs to be seen again if she is not any better after finishing Augmentin, Augmentin is a very potent antibiotic for upper respiratory infections so she is not better she needs to come back in.

## 2017-11-10 NOTE — Telephone Encounter (Signed)
Seen 9/5 christy = finished augmentin.  Chest congestion- white and think mucus Low grade temp - 99.0 several times  Cough

## 2017-11-11 ENCOUNTER — Ambulatory Visit (INDEPENDENT_AMBULATORY_CARE_PROVIDER_SITE_OTHER): Payer: Medicare Other | Admitting: Family Medicine

## 2017-11-11 ENCOUNTER — Encounter: Payer: Self-pay | Admitting: Family Medicine

## 2017-11-11 VITALS — BP 144/89 | HR 90 | Temp 98.2°F | Ht 59.0 in | Wt 204.4 lb

## 2017-11-11 DIAGNOSIS — J441 Chronic obstructive pulmonary disease with (acute) exacerbation: Secondary | ICD-10-CM

## 2017-11-11 DIAGNOSIS — Z720 Tobacco use: Secondary | ICD-10-CM | POA: Diagnosis not present

## 2017-11-11 MED ORDER — PREDNISONE 20 MG PO TABS: 40.0000 mg | ORAL_TABLET | Freq: Every day | ORAL | 0 refills | Status: AC

## 2017-11-11 MED ORDER — DOXYCYCLINE HYCLATE 100 MG PO TABS: 100.0000 mg | ORAL_TABLET | Freq: Two times a day (BID) | ORAL | 0 refills | Status: AC

## 2017-11-11 MED ORDER — METHYLPREDNISOLONE ACETATE 40 MG/ML IJ SUSP
40.0000 mg | Freq: Once | INTRAMUSCULAR | Status: AC
  Administered 2017-11-11: 40 mg via INTRAMUSCULAR

## 2017-11-11 NOTE — Patient Instructions (Signed)
I am treating you as a flare of your COPD.  You got a dose of steroid injection today.     Start the prednisone tomorrow with breakfast.    Remember to take the antibiotic, doxycycline, twice a day with breakfast and dinner.  It is important that you take this medicine with food otherwise you will be sick on your stomach.    Use your albuterol every 6 hours for the next 2 days then use it only if needed as directed.   If you develop fevers, worsening shortness of breath, blood in your cough, please seek immediate medical attention.   Chronic Obstructive Pulmonary Disease Exacerbation Chronic obstructive pulmonary disease (COPD) is a common lung problem. In COPD, the flow of air from the lungs is limited. COPD exacerbations are times that breathing gets worse and you need extra treatment. Without treatment they can be life threatening. If they happen often, your lungs can become more damaged. If your COPD gets worse, your doctor may treat you with:  Medicines.  Oxygen.  Different ways to clear your airway, such as using a mask.  Follow these instructions at home:  Do not smoke.  Avoid tobacco smoke and other things that bother your lungs.  If given, take your antibiotic medicine as told. Finish the medicine even if you start to feel better.  Only take medicines as told by your doctor.  Drink enough fluids to keep your pee (urine) clear or pale yellow (unless your doctor has told you not to).  Use a cool mist machine (vaporizer).  If you use oxygen or a machine that turns liquid medicine into a mist (nebulizer), continue to use them as told.  Keep up with shots (vaccinations) as told by your doctor.  Exercise regularly.  Eat healthy foods.  Keep all doctor visits as told. Get help right away if:  You are very short of breath and it gets worse.  You have trouble talking.  You have bad chest pain.  You have blood in your spit (sputum).  You have a fever.  You keep  throwing up (vomiting).  You feel weak, or you pass out (faint).  You feel confused.  You keep getting worse. This information is not intended to replace advice given to you by your health care provider. Make sure you discuss any questions you have with your health care provider. Document Released: 01/29/2011 Document Revised: 07/18/2015 Document Reviewed: 10/14/2012 Elsevier Interactive Patient Education  2017 [employer].

## 2017-11-11 NOTE — Progress Notes (Signed)
Subjective: CC: cough PCP: Sharion Balloon, FNP TKP:[id number] Sheila [name] is a 77 y.o. female presenting to clinic today for:  1. Cough Patient reports an almost 2-week history of productive cough, congestion, otalgia.  She was seen on 10/28/2017 and diagnosed with acute bacterial sinusitis.  She was treated with Augmentin for 7 days but states that symptoms did not improve.  She continues to have productive cough and now has some wheezing.  Denies overt shortness of breath.  No fevers recently.  She is using her albuterol nebulizer 3 times daily as well as the Symbicort twice daily as directed.  She does not smoke but she does use smokeless tobacco products.   ROS: Per HPI  Allergies  Allergen Reactions  . Ibuprofen     History of bleeding ulcers - contra-indicated   . Benadryl [Diphenhydramine Hcl] Other (See Comments)    Worsening depression   . Lipitor [Atorvastatin] Other (See Comments)    Body aches.  . Hydrocodone     Nausea and vomiting  . Codeine Nausea And Vomiting  . Morphine And Related Nausea And Vomiting  . Tdap [Tetanus-Diphth-Acell Pertussis] Swelling    Redness and localized swelling at injection site    Past Medical History:  Diagnosis Date  . Allergy   . Bell's palsy   . Cancer ([place])    skin CA on face  . Cataract   . Chest pain 07/2009   Myoveiw stress test negative.  . Depression   . Diverticulosis of colon   . DJD (degenerative joint disease) of knee   . Dyspnea    on exertion  . Family history of adverse reaction to anesthesia    sister ha nausea/vomiting  . Gastric ulcer with hemorrhage 01/14/2011  . GERD (gastroesophageal reflux disease)   . Headache   . History of fractured pelvis   . Hypercholesteremia   . Hypertension   . Hypothyroidism   . UTI (lower urinary tract infection)     Current Outpatient Medications:  .  albuterol (PROVENTIL) (2.5 MG/3ML) 0.083% nebulizer solution, NEBULIZE 1 VIAL EVERY 6-8 HOURS AS NEEDED FOR WHEEZING OR  SHORTNESS OF BREATH, Disp: 180 mL, Rfl: 0 .  ALPRAZolam (XANAX) 0.5 MG tablet, TAKE (1) TABLET TWICE A DAY., Disp: 60 tablet, Rfl: 2 .  budesonide-formoterol (SYMBICORT) 80-4.5 MCG/ACT inhaler, INHALE 2 PUFFS 2 TIMES A DAY, Disp: 10.2 g, Rfl: 5 .  citalopram (CELEXA) 20 MG tablet, TAKE 1 TABLET DAILY, Disp: 90 tablet, Rfl: 0 .  denosumab (PROLIA) 60 MG/ML SOLN injection, Inject 60 mg into the skin every 6 (six) months. Bring to office for administration., Disp: 1 mL, Rfl: 0 .  DULoxetine (CYMBALTA) 60 MG capsule, Take 1 capsule (60 mg total) by mouth daily., Disp: 90 capsule, Rfl: 1 .  estradiol (ESTRACE) 0.1 MG/GM vaginal cream, Place 1 Applicatorful vaginally every Monday, Wednesday, and Friday. Reported on 08/07/2015, Disp: , Rfl:  .  furosemide (LASIX) 20 MG tablet, TAKE 1 TABLET DAILY, Disp: 90 tablet, Rfl: 1 .  gabapentin (NEURONTIN) 300 MG capsule, TAKE (1) CAPSULE THREE TIMES DAILY., Disp: 270 capsule, Rfl: 0 .  hydroxypropyl methylcellulose / hypromellose (ISOPTO TEARS / GONIOVISC) 2.5 % ophthalmic solution, Place 1 drop into both eyes as needed for dry eyes., Disp: , Rfl:  .  levothyroxine (SYNTHROID, LEVOTHROID) 75 MCG tablet, TAKE 1 TABLET DAILY, Disp: 90 tablet, Rfl: 3 .  LIVALO 2 MG TABS, TAKE 1 TABLET DAILY, Disp: 90 tablet, Rfl: 1 .  meclizine (ANTIVERT) 25  MG tablet, Take 1 tablet 3 times daily as needed for dizziness., Disp: 30 tablet, Rfl: 4 .  Multiple Vitamins-Minerals (CENTRUM SILVER ADULT 50+ PO), Take 1 tablet by mouth every other day., Disp: , Rfl:  .  nystatin (MYCOSTATIN/NYSTOP) powder, APPLY 4 TIMES A DAY TO AFFECTED AREAS, Disp: 30 g, Rfl: 2 .  pantoprazole (PROTONIX) 20 MG tablet, TAKE 1 TABLET DAILY, Disp: 30 tablet, Rfl: 5 .  potassium chloride SA (K-DUR,KLOR-CON) 20 MEQ tablet, TAKE (1) TABLET TWICE A DAY., Disp: 180 tablet, Rfl: 1 .  traMADol (ULTRAM) 50 MG tablet, Take 1 tablet (50 mg total) by mouth every 12 (twelve) hours as needed., Disp: 60 tablet, Rfl:  3 Social History   Socioeconomic History  . Marital status: Divorced    Spouse name: Not on file  . Number of children: Not on file  . Years of education: Not on file  . Highest education level: Not on file  Occupational History  . Not on file  Social Needs  . Financial resource strain: Not on file  . Food insecurity:    Worry: Not on file    Inability: Not on file  . Transportation needs:    Medical: Not on file    Non-medical: Not on file  Tobacco Use  . Smoking status: Former Smoker    Types: Cigarettes    Last attempt to quit: 02/24/1968    Years since quitting: 49.7  . Smokeless tobacco: Current User    Types: Snuff  . Tobacco comment: quit yrs and yrs ago when she was very young  Substance and Sexual Activity  . Alcohol use: No  . Drug use: No  . Sexual activity: Never  Lifestyle  . Physical activity:    Days per week: Not on file    Minutes per session: Not on file  . Stress: Not on file  Relationships  . Social connections:    Talks on phone: Not on file    Gets together: Not on file    Attends religious service: Not on file    Active member of club or organization: Not on file    Attends meetings of clubs or organizations: Not on file    Relationship status: Not on file  . Intimate partner violence:    Fear of current or ex partner: Not on file    Emotionally abused: Not on file    Physically abused: Not on file    Forced sexual activity: Not on file  Other Topics Concern  . Not on file  Social History Narrative  . Not on file   Family History  Problem Relation Age of Onset  . Aneurysm Mother   . Stroke Mother   . Hypertension Mother   . Cancer Father        lung  . Heart disease Sister   . Hip fracture Sister   . Cancer Brother   . Alzheimer's disease Sister   . Cancer Sister        skin, uterine  . Cancer Brother     Objective: Office vital signs reviewed. BP (!) 144/89   Pulse 90   Temp 98.2 F (36.8 C) (Oral)   Ht 4\' 11"  (1.499 m)    Wt 204 lb 6 oz (92.7 kg)   SpO2 94%   BMI 41.28 kg/m   Physical Examination:  General: Awake, alert, tired and chronically ill appearing. No acute distress HEENT: Normal    Neck: No masses palpated. No lymphadenopathy  Ears: Tympanic membranes intact, normal light reflex, no erythema, no bulging    Eyes: PERRLA, extraocular membranes intact, sclera white    Nose: nasal turbinates moist, clear nasal discharge    Throat: moist mucus membranes, no erythema, no tonsillar exudate.  Airway is patent Cardio: regular rate and rhythm, S1S2 heard, no murmurs appreciated Pulm: Diffuse, global expiratory wheeze.  Air movement is fair.  No rales or rhonchi noted.  She has normal work of breathing on room air.  Intermittently coughing during exam.  Assessment/ Plan: 77 y.o. female   1. COPD exacerbation ([place]) Patient is afebrile and nontoxic-appearing.  Her physical exam is remarkable for global expiratory wheeze.  Given productive cough, ongoing symptoms and the fact that her oxygen is on the low end of normal, will treat as a COPD exacerbation.  Prednisone burst sent to the pharmacy.  Because her pharmacy closes at 6 she was also given a dose of Depo-Medrol intramuscularly.  She can start the prednisone tomorrow as well as the doxycycline.  Instructions for doxycycline were reviewed with the patient and her family member.  She is to take the medication with food.  She will use the albuterol every 6 hours for the next 2 days and as needed as directed.  Home care instructions were reinforced.  Handout provided.  Reasons for return discussed with the patient ; she voiced good understanding and will follow-up as needed. - methylPREDNISolone acetate (DEPO-MEDROL) injection 40 mg    Meds ordered this encounter  Medications  . doxycycline (VIBRA-TABS) 100 MG tablet    Sig: Take 1 tablet (100 mg total) by mouth 2 (two) times daily for 7 days.    Dispense:  14 tablet    Refill:  0  . predniSONE  (DELTASONE) 20 MG tablet    Sig: Take 2 tablets (40 mg total) by mouth daily with breakfast for 5 days.    Dispense:  10 tablet    Refill:  0  . methylPREDNISolone acetate (DEPO-MEDROL) injection 40 mg     Janora Norlander, DO [place] [phone]

## 2017-11-11 NOTE — Telephone Encounter (Signed)
Pt advised of MD feedback and given appt at 5:45 this afternoon in after hours clinic.

## 2017-11-17 ENCOUNTER — Other Ambulatory Visit: Payer: Self-pay | Admitting: Family

## 2017-11-30 ENCOUNTER — Other Ambulatory Visit: Payer: Self-pay | Admitting: Family

## 2017-12-08 ENCOUNTER — Other Ambulatory Visit (INDEPENDENT_AMBULATORY_CARE_PROVIDER_SITE_OTHER): Payer: Self-pay | Admitting: Internal Medicine

## 2017-12-08 ENCOUNTER — Other Ambulatory Visit: Payer: Self-pay | Admitting: Family

## 2017-12-09 ENCOUNTER — Telehealth: Payer: Self-pay | Admitting: Family

## 2017-12-09 ENCOUNTER — Ambulatory Visit (INDEPENDENT_AMBULATORY_CARE_PROVIDER_SITE_OTHER): Payer: Medicare Other | Admitting: Family

## 2017-12-09 ENCOUNTER — Encounter: Payer: Self-pay | Admitting: Family

## 2017-12-09 VITALS — BP 123/73 | HR 101 | Ht 59.0 in | Wt 206.2 lb

## 2017-12-09 DIAGNOSIS — M545 Low back pain, unspecified: Secondary | ICD-10-CM | POA: Insufficient documentation

## 2017-12-09 DIAGNOSIS — Z23 Encounter for immunization: Secondary | ICD-10-CM

## 2017-12-09 DIAGNOSIS — I10 Essential (primary) hypertension: Secondary | ICD-10-CM

## 2017-12-09 DIAGNOSIS — Z0289 Encounter for other administrative examinations: Secondary | ICD-10-CM

## 2017-12-09 DIAGNOSIS — F1120 Opioid dependence, uncomplicated: Secondary | ICD-10-CM

## 2017-12-09 DIAGNOSIS — M150 Primary generalized (osteo)arthritis: Secondary | ICD-10-CM | POA: Diagnosis not present

## 2017-12-09 DIAGNOSIS — M159 Polyosteoarthritis, unspecified: Secondary | ICD-10-CM

## 2017-12-09 DIAGNOSIS — F331 Major depressive disorder, recurrent, moderate: Secondary | ICD-10-CM

## 2017-12-09 DIAGNOSIS — F411 Generalized anxiety disorder: Secondary | ICD-10-CM | POA: Diagnosis not present

## 2017-12-09 DIAGNOSIS — M8949 Other hypertrophic osteoarthropathy, multiple sites: Secondary | ICD-10-CM

## 2017-12-09 DIAGNOSIS — G8929 Other chronic pain: Secondary | ICD-10-CM

## 2017-12-09 LAB — BMP8+EGFR
BUN/Creatinine Ratio: 22 (ref 12–28)
BUN: 16 mg/dL (ref 8–27)
CO2: 23 mmol/L (ref 20–29)
Calcium: 10.6 mg/dL — ABNORMAL HIGH (ref 8.7–10.3)
Chloride: 100 mmol/L (ref 96–106)
Creatinine, Ser: 0.74 mg/dL (ref 0.57–1.00)
GFR calc Af Amer: 91 mL/min/{1.73_m2} (ref >59)
GFR calc non Af Amer: 79 mL/min/{1.73_m2} (ref >59)
Glucose: 114 mg/dL — ABNORMAL HIGH (ref 65–99)
Potassium: 4.2 mmol/L (ref 3.5–5.2)
Sodium: 141 mmol/L (ref 134–144)

## 2017-12-09 MED ORDER — TRAMADOL HCL 50 MG PO TABS: 50.0000 mg | ORAL_TABLET | Freq: Two times a day (BID) | ORAL | 3 refills | Status: DC | PRN

## 2017-12-09 MED ORDER — DULOXETINE HCL 60 MG PO CPEP: 60.0000 mg | ORAL_CAPSULE | Freq: Every day | ORAL | 1 refills | Status: DC

## 2017-12-09 MED ORDER — ALPRAZOLAM 0.5 MG PO TABS: ORAL_TABLET | ORAL | 2 refills | Status: DC

## 2017-12-09 MED ORDER — MECLIZINE HCL 25 MG PO TABS: ORAL_TABLET | ORAL | 4 refills | Status: DC

## 2017-12-09 NOTE — Telephone Encounter (Signed)
Patient's daughter wants to let you know her Cymbalta prescription in September was for 60 mg and in October it was 30 mg.  She will have her take two 30 mg tablets.  Also, she is prescribed 30 tabs of Meclizine monthly but takes it twice a day, daughter would like to know if that could be increased to 60 tabs monthly.  Please advise.

## 2017-12-09 NOTE — Progress Notes (Signed)
Subjective:    Patient ID: Sheila [name], female    DOB: Aug 04, 1940, 77 y.o.   MRN: [id number]  Chief Complaint  Patient presents with  . Depression    six week recheck   Pt presents to the office today to recheck GAD and Depression. On 10/28/17 we increased her Cymbalta to 60 mg from 30 mg.  Depression         This is a chronic problem.  The current episode started more than 1 year ago.   The onset quality is gradual.   The problem occurs intermittently.  The problem has been waxing and waning since onset.  Associated symptoms include irritable, restlessness, decreased interest and sad.  Associated symptoms include no hopelessness.     The symptoms are aggravated by family issues.  Past medical history includes anxiety.   Anxiety  Presents for follow-up visit. Symptoms include restlessness and shortness of breath.    Back Pain  This is a chronic problem. The current episode started more than 1 year ago. The problem occurs intermittently. The problem has been waxing and waning since onset. The pain is present in the lumbar spine. The pain is moderate. Pertinent negatives include no bladder incontinence or bowel incontinence. She has tried analgesics and bed rest for the symptoms.  Hypertension  This is a chronic problem. The current episode started more than 1 year ago. Associated symptoms include anxiety, malaise/fatigue, peripheral edema and shortness of breath. Risk factors for coronary artery disease include dyslipidemia, obesity, sedentary lifestyle and stress.  Arthritis  Presents for follow-up visit. She complains of pain and stiffness. Affected locations include the right knee and left knee (back).      Review of Systems  Constitutional: Positive for malaise/fatigue.  Respiratory: Positive for shortness of breath.   Gastrointestinal: Negative for bowel incontinence.  Genitourinary: Negative for bladder incontinence.  Musculoskeletal: Positive for arthritis, back pain and  stiffness.  Psychiatric/Behavioral: Positive for depression.  All other systems reviewed and are negative.      Objective:   Physical Exam  Constitutional: She is oriented to person, place, and time. She appears well-developed and well-nourished. She is irritable. No distress.  HENT:  Head: Normocephalic and atraumatic.  Right Ear: External ear normal.  Left Ear: External ear normal.  Mouth/Throat: Oropharynx is clear and moist.  Eyes: Pupils are equal, round, and reactive to light.  Neck: Normal range of motion. Neck supple. No thyromegaly present.  Cardiovascular: Normal rate, regular rhythm, normal heart sounds and intact distal pulses.  No murmur heard. Pulmonary/Chest: Effort normal. No respiratory distress. She has wheezes.  Abdominal: Soft. Bowel sounds are normal. She exhibits no distension. There is no tenderness.  Musculoskeletal: She exhibits no edema or tenderness.  Pain in lower lumbar with flexion   Neurological: She is alert and oriented to person, place, and time. She has normal reflexes. No cranial nerve deficit.  Skin: Skin is warm and dry.  Psychiatric: She has a normal mood and affect. Her behavior is normal. Judgment and thought content normal.  Vitals reviewed.     Ht _0  (1.499 m)   Wt 206 lb 3.2 oz (93.5 kg)   BMI 41.65 kg/m      Assessment & Plan:  Sheila [name] comes in today with chief complaint of Depression (six week recheck)   Diagnosis and orders addressed:  1. GAD (generalized anxiety disorder) - DULoxetine (CYMBALTA) 60 MG capsule; Take 1 capsule (60 mg total) by mouth daily.  Dispense:  90 capsule; Refill: 1 - ALPRAZolam (XANAX) 0.5 MG tablet; TAKE (1) TABLET TWICE A DAY.  Dispense: 60 tablet; Refill: 2 - BMP8+EGFR  2. Moderate episode of recurrent major depressive disorder (HCC)  - DULoxetine (CYMBALTA) 60 MG capsule; Take 1 capsule (60 mg total) by mouth daily.  Dispense: 90 capsule; Refill: 1 - BMP8+EGFR  3. Uncomplicated  opioid dependence (HCC) - traMADol (ULTRAM) 50 MG tablet; Take 1 tablet (50 mg total) by mouth every 12 (twelve) hours as needed.  Dispense: 60 tablet; Refill: 3 - BMP8+EGFR  4. Primary osteoarthritis involving multiple joints - traMADol (ULTRAM) 50 MG tablet; Take 1 tablet (50 mg total) by mouth every 12 (twelve) hours as needed.  Dispense: 60 tablet; Refill: 3 - BMP8+EGFR  5. Chronic bilateral low back pain - traMADol (ULTRAM) 50 MG tablet; Take 1 tablet (50 mg total) by mouth every 12 (twelve) hours as needed.  Dispense: 60 tablet; Refill: 3 - BMP8+EGFR  6. Essential hypertension, benign - BMP8+EGFR  7. Morbid obesity (HCC) - BMP8+EGFR  8. Pain medication agreement signed - BMP8+EGFR   Labs pending Health Maintenance reviewed- Flu vaccine given today Diet and exercise encouraged  Follow up plan: 3 months    [name], FNP

## 2017-12-09 NOTE — Telephone Encounter (Signed)
Last seen 11/11/17

## 2017-12-09 NOTE — Patient Instructions (Signed)
Living With Depression Everyone experiences occasional disappointment, sadness, and loss in their lives. When you are feeling down, blue, or sad for at least 2 weeks in a row, it may mean that you have depression. Depression can affect your thoughts and feelings, relationships, daily activities, and physical health. It is caused by changes in the way your brain functions. If you receive a diagnosis of depression, your health care provider will tell you which type of depression you have and what treatment options are available to you. If you are living with depression, there are ways to help you recover from it and also ways to prevent it from coming back. How to cope with lifestyle changes Coping with stress Stress is your body's reaction to life changes and events, both good and bad. Stressful situations may include:  Getting married.  The death of a spouse.  Losing a job.  Retiring.  Having a baby.  Stress can last just a few hours or it can be ongoing. Stress can play a major role in depression, so it is important to learn both how to cope with stress and how to think about it differently. Talk with your health care provider or a counselor if you would like to learn more about stress reduction. He or she may suggest some stress reduction techniques, such as:  Music therapy. This can include creating music or listening to music. Choose music that you enjoy and that inspires you.  Mindfulness-based meditation. This kind of meditation can be done while sitting or walking. It involves being aware of your normal breaths, rather than trying to control your breathing.  Centering prayer. This is a kind of meditation that involves focusing on a spiritual word or phrase. Choose a word, phrase, or sacred image that is meaningful to you and that brings you peace.  Deep breathing. To do this, expand your stomach and inhale slowly through your nose. Hold your breath for 3-5 seconds, then exhale  slowly, allowing your stomach muscles to relax.  Muscle relaxation. This involves intentionally tensing muscles then relaxing them.  Choose a stress reduction technique that fits your lifestyle and personality. Stress reduction techniques take time and practice to develop. Set aside 5-15 minutes a day to do them. Therapists can offer training in these techniques. The training may be covered by some insurance plans. Other things you can do to manage stress include:  Keeping a stress diary. This can help you learn what triggers your stress and ways to control your response.  Understanding what your limits are and saying no to requests or events that lead to a schedule that is too full.  Thinking about how you respond to certain situations. You may not be able to control everything, but you can control how you react.  Adding humor to your life by watching funny films or TV shows.  Making time for activities that help you relax and not feeling guilty about spending your time this way.  Medicines Your health care provider may suggest certain medicines if he or she feels that they will help improve your condition. Avoid using alcohol and other substances that may prevent your medicines from working properly (may interact). It is also important to:  Talk with your pharmacist or health care provider about all the medicines that you take, their possible side effects, and what medicines are safe to take together.  Make it your goal to take part in all treatment decisions (shared decision-making). This includes giving input on the side   effects of medicines. It is best if shared decision-making with your health care provider is part of your total treatment plan.  If your health care provider prescribes a medicine, you may not notice the full benefits of it for 4-8 weeks. Most people who are treated for depression need to be on medicine for at least 6-12 months after they feel better. If you are taking  medicines as part of your treatment, do not stop taking medicines without first talking to your health care provider. You may need to have the medicine slowly decreased (tapered) over time to decrease the risk of harmful side effects. Relationships Your health care provider may suggest family therapy along with individual therapy and drug therapy. While there may not be family problems that are causing you to feel depressed, it is still important to make sure your family learns as much as they can about your mental health. Having your family's support can help make your treatment successful. How to recognize changes in your condition Everyone has a different response to treatment for depression. Recovery from major depression happens when you have not had signs of major depression for two months. This may mean that you will start to:  Have more interest in doing activities.  Feel less hopeless than you did 2 months ago.  Have more energy.  Overeat less often, or have better or improving appetite.  Have better concentration.  Your health care provider will work with you to decide the next steps in your recovery. It is also important to recognize when your condition is getting worse. Watch for these signs:  Having fatigue or low energy.  Eating too much or too little.  Sleeping too much or too little.  Feeling restless, agitated, or hopeless.  Having trouble concentrating or making decisions.  Having unexplained physical complaints.  Feeling irritable, angry, or aggressive.  Get help as soon as you or your family members notice these symptoms coming back. How to get support and help from others How to talk with friends and family members about your condition Talking to friends and family members about your condition can provide you with one way to get support and guidance. Reach out to trusted friends or family members, explain your symptoms to them, and let them know that you are  working with a health care provider to treat your depression. Financial resources Not all insurance plans cover mental health care, so it is important to check with your insurance carrier. If paying for co-pays or counseling services is a problem, search for a local or county mental health care center. They may be able to offer public mental health care services at low or no cost when you are not able to see a private health care provider. If you are taking medicine for depression, you may be able to get the generic form, which may be less expensive. Some makers of prescription medicines also offer help to patients who cannot afford the medicines they need. Follow these instructions at home:  Get the right amount and quality of sleep.  Cut down on using caffeine, tobacco, alcohol, and other potentially harmful substances.  Try to exercise, such as walking or lifting small weights.  Take over-the-counter and prescription medicines only as told by your health care provider.  Eat a healthy diet that includes plenty of vegetables, fruits, whole grains, low-fat dairy products, and lean protein. Do not eat a lot of foods that are high in solid fats, added sugars, or salt.    Keep all follow-up visits as told by your health care provider. This is important. Contact a health care provider if:  You stop taking your antidepressant medicines, and you have any of these symptoms: ? Nausea. ? Headache. ? Feeling lightheaded. ? Chills and body aches. ? Not being able to sleep (insomnia).  You or your friends and family think your depression is getting worse. Get help right away if:  You have thoughts of hurting yourself or others. If you ever feel like you may hurt yourself or others, or have thoughts about taking your own life, get help right away. You can go to your nearest emergency department or call:  Your local emergency services (911 in the [place]  A suicide crisis helpline, such as the  National Suicide Prevention Lifeline at 1-800-273-8255. This is open 24-hours a day.  Summary  If you are living with depression, there are ways to help you recover from it and also ways to prevent it from coming back.  Work with your health care team to create a management plan that includes counseling, stress management techniques, and healthy lifestyle habits. This information is not intended to replace advice given to you by your health care provider. Make sure you discuss any questions you have with your health care provider. Document Released: 01/13/2016 Document Revised: 01/13/2016 Document Reviewed: 01/13/2016 Elsevier Interactive Patient Education  2018 [employer]  

## 2017-12-09 NOTE — Telephone Encounter (Signed)
Prescription sent to pharmacy.

## 2017-12-13 ENCOUNTER — Encounter: Payer: Self-pay | Admitting: Family Medicine

## 2017-12-13 ENCOUNTER — Ambulatory Visit (INDEPENDENT_AMBULATORY_CARE_PROVIDER_SITE_OTHER): Payer: Medicare Other | Admitting: Family Medicine

## 2017-12-13 ENCOUNTER — Telehealth: Payer: Self-pay | Admitting: Family

## 2017-12-13 VITALS — BP 139/87 | HR 99 | Temp 99.3°F | Ht 59.0 in | Wt 206.0 lb

## 2017-12-13 DIAGNOSIS — J441 Chronic obstructive pulmonary disease with (acute) exacerbation: Secondary | ICD-10-CM

## 2017-12-13 MED ORDER — AZITHROMYCIN 250 MG PO TABS: ORAL_TABLET | ORAL | 0 refills | Status: DC

## 2017-12-13 MED ORDER — METHYLPREDNISOLONE ACETATE 80 MG/ML IJ SUSP
80.0000 mg | Freq: Once | INTRAMUSCULAR | Status: AC
  Administered 2017-12-13: 80 mg via INTRAMUSCULAR

## 2017-12-13 MED ORDER — BENZONATATE 100 MG PO CAPS: 100.0000 mg | ORAL_CAPSULE | Freq: Three times a day (TID) | ORAL | 0 refills | Status: DC | PRN

## 2017-12-13 MED ORDER — PREDNISONE 20 MG PO TABS: ORAL_TABLET | ORAL | 0 refills | Status: DC

## 2017-12-13 NOTE — Progress Notes (Signed)
BP 139/87 (BP Location: Right Arm, Patient Position: Sitting, Cuff Size: Large)   Pulse 99   Temp 99.3 F (37.4 C) (Oral)   Ht 4\' 11"  (1.499 m)   Wt 206 lb (93.4 kg)   SpO2 95%   BMI 41.61 kg/m    Subjective:    Patient ID: Sheila [name], female    DOB: [birth year], 77 y.o.   MRN: [id number]  HPI: Sheila [name] is a 77 y.o. female presenting on 12/13/2017 for Cough (productive cough and wheezing and left sided rib pain x 1 week. Has not taken anything OTC. )   HPI Cough and congestion and wheezing Patient comes in complaining of cough and congestion and wheezing and left-sided rib pain is been going on for 1 week.  She has been increasingly worsening and now she is having left lower rib pain along with it as well.  Her cough and wheezing is been keeping her up at night and she has been using her albuterol nebulizer but feels like it is not helping as much with her currently.  Relevant past medical, surgical, family and social history reviewed and updated as indicated. Interim medical history since our last visit reviewed. Allergies and medications reviewed and updated.  Review of Systems  Constitutional: Negative for chills and fever.  HENT: Positive for congestion, postnasal drip, rhinorrhea, sinus pressure and sore throat. Negative for ear discharge, ear pain and sneezing.   Eyes: Negative for pain, redness and visual disturbance.  Respiratory: Positive for cough and wheezing. Negative for chest tightness and shortness of breath.   Cardiovascular: Positive for chest pain. Negative for leg swelling.  Genitourinary: Negative for difficulty urinating and dysuria.  Musculoskeletal: Negative for back pain and gait problem.  Skin: Negative for rash.  Neurological: Negative for light-headedness and headaches.  Psychiatric/Behavioral: Negative for agitation and behavioral problems.  All other systems reviewed and are negative.   Per HPI unless specifically indicated  above   Allergies as of 12/13/2017      Reactions   Ibuprofen    History of bleeding ulcers - contra-indicated   Benadryl [diphenhydramine Hcl] Other (See Comments)   Worsening depression   Lipitor [atorvastatin] Other (See Comments)   Body aches.   Hydrocodone    Nausea and vomiting   Codeine Nausea And Vomiting   Morphine And Related Nausea And Vomiting   Tdap [tetanus-diphth-acell Pertussis] Swelling   Redness and localized swelling at injection site      Medication List        Accurate as of 12/13/17  6:42 PM. Always use your most recent med list.          albuterol (2.5 MG/3ML) 0.083% nebulizer solution Commonly known as:  PROVENTIL NEBULIZE 1 VIAL EVERY 6-8 HOURS AS NEEDED FOR WHEEZING OR SHORTNESS OF BREATH   ALPRAZolam 0.5 MG tablet Commonly known as:  XANAX TAKE (1) TABLET TWICE A DAY.   azithromycin 250 MG tablet Commonly known as:  ZITHROMAX Take 2 the first day and then one each day after.   budesonide-formoterol 80-4.5 MCG/ACT inhaler Commonly known as:  SYMBICORT INHALE 2 PUFFS 2 TIMES A DAY   CENTRUM SILVER ADULT 50+ PO Take 1 tablet by mouth every other day.   denosumab 60 MG/ML Soln injection Commonly known as:  PROLIA Inject 60 mg into the skin every 6 (six) months. Bring to office for administration.   DULoxetine 60 MG capsule Commonly known as:  CYMBALTA Take 1 capsule (60 mg total) by  mouth daily.   estradiol 0.1 MG/GM vaginal cream Commonly known as:  ESTRACE Place 1 Applicatorful vaginally every Monday, Wednesday, and Friday. Reported on 08/07/2015   furosemide 20 MG tablet Commonly known as:  LASIX TAKE 1 TABLET DAILY   gabapentin 300 MG capsule Commonly known as:  NEURONTIN TAKE (1) CAPSULE THREE TIMES DAILY.   hydroxypropyl methylcellulose / hypromellose 2.5 % ophthalmic solution Commonly known as:  ISOPTO TEARS / GONIOVISC Place 1 drop into both eyes as needed for dry eyes.   levothyroxine 75 MCG tablet Commonly known  as:  SYNTHROID, LEVOTHROID TAKE 1 TABLET DAILY   LIVALO 2 MG Tabs Generic drug:  Pitavastatin Calcium TAKE 1 TABLET DAILY   meclizine 25 MG tablet Commonly known as:  ANTIVERT Take 1 tablet 3 times daily as needed for dizziness.   pantoprazole 20 MG tablet Commonly known as:  PROTONIX TAKE 1 TABLET DAILY   potassium chloride SA 20 MEQ tablet Commonly known as:  K-DUR,KLOR-CON TAKE (1) TABLET TWICE A DAY.   predniSONE 20 MG tablet Commonly known as:  DELTASONE 2 po at same time daily for 5 days   traMADol 50 MG tablet Commonly known as:  ULTRAM Take 1 tablet (50 mg total) by mouth every 12 (twelve) hours as needed.          Objective:    BP 139/87 (BP Location: Right Arm, Patient Position: Sitting, Cuff Size: Large)   Pulse 99   Temp 99.3 F (37.4 C) (Oral)   Ht 4\' 11"  (1.499 m)   Wt 206 lb (93.4 kg)   SpO2 95%   BMI 41.61 kg/m   Wt Readings from Last 3 Encounters:  12/13/17 206 lb (93.4 kg)  12/09/17 206 lb 3.2 oz (93.5 kg)  11/11/17 204 lb 6 oz (92.7 kg)    Physical Exam  Constitutional: She is oriented to person, place, and time. She appears well-developed and well-nourished. No distress.  Eyes: Conjunctivae are normal.  Cardiovascular: Normal rate, regular rhythm, normal heart sounds and intact distal pulses.  No murmur heard. Pulmonary/Chest: Effort normal. No respiratory distress. She has wheezes (Wheezes throughout). She has no rales. She exhibits tenderness (Left lower rib tenderness, worse with deep inspiration as well).  Neurological: She is alert and oriented to person, place, and time. Coordination normal.  Skin: Skin is warm and dry. No rash noted. She is not diaphoretic.  Psychiatric: She has a normal mood and affect. Her behavior is normal.  Nursing note and vitals reviewed.       Assessment & Plan:   Problem List Items Addressed This Visit    None    Visit Diagnoses    COPD with acute exacerbation (HCC)    -  Primary   Relevant  Medications   methylPREDNISolone acetate (DEPO-MEDROL) injection 80 mg (Completed)   predniSONE (DELTASONE) 20 MG tablet   azithromycin (ZITHROMAX) 250 MG tablet   benzonatate (TESSALON PERLES) 100 MG capsule      Her husband and sister who lives with her for many years was a smoker Follow up plan: Return if symptoms worsen or fail to improve.  Counseling provided for all of the vaccine components No orders of the defined types were placed in this encounter.   Caryl Pina, MD [facility] 12/13/2017, 6:42 PM

## 2017-12-13 NOTE — Telephone Encounter (Signed)
Second call appt made

## 2017-12-22 ENCOUNTER — Encounter: Payer: Self-pay | Admitting: *Deleted

## 2017-12-22 ENCOUNTER — Ambulatory Visit (INDEPENDENT_AMBULATORY_CARE_PROVIDER_SITE_OTHER): Payer: Medicare Other

## 2017-12-22 ENCOUNTER — Ambulatory Visit (INDEPENDENT_AMBULATORY_CARE_PROVIDER_SITE_OTHER): Payer: Medicare Other | Admitting: *Deleted

## 2017-12-22 VITALS — BP 123/85 | HR 92 | Ht 59.0 in | Wt 203.0 lb

## 2017-12-22 DIAGNOSIS — M8589 Other specified disorders of bone density and structure, multiple sites: Secondary | ICD-10-CM | POA: Diagnosis not present

## 2017-12-22 DIAGNOSIS — Z0000 Encounter for general adult medical examination without abnormal findings: Secondary | ICD-10-CM

## 2017-12-22 DIAGNOSIS — Z780 Asymptomatic menopausal state: Secondary | ICD-10-CM

## 2017-12-22 NOTE — Patient Instructions (Signed)
Please work on your goal of remembering to take your medications each evening - by setting an alarm or having your daughter or aid to remind you.  Please bring a copy of your [place] will to our office to be filed in your chart.   Thank you for coming in for your Annual Wellness Visit today!    Preventive Care 76 Years and Older, Female Preventive care refers to lifestyle choices and visits with your health care provider that can promote health and wellness. What does preventive care include?  A yearly physical exam. This is also called an annual well check.  Dental exams once or twice a year.  Routine eye exams. Ask your health care provider how often you should have your eyes checked.  Personal lifestyle choices, including: ? Daily care of your teeth and gums. ? Regular physical activity. ? Eating a healthy diet. ? Avoiding tobacco and drug use. ? Limiting alcohol use. ? Practicing safe sex. ? Taking low-dose aspirin every day. ? Taking vitamin and mineral supplements as recommended by your health care provider. What happens during an annual well check? The services and screenings done by your health care provider during your annual well check will depend on your age, overall health, lifestyle risk factors, and family history of disease. Counseling Your health care provider may ask you questions about your:  Alcohol use.  Tobacco use.  Drug use.  Emotional well-being.  Home and relationship well-being.  Sexual activity.  Eating habits.  History of falls.  Memory and ability to understand (cognition).  Work and work Statistician.  Reproductive health.  Screening You may have the following tests or measurements:  Height, weight, and BMI.  Blood pressure.  Lipid and cholesterol levels. These may be checked every 5 years, or more frequently if you are over 61 years old.  Skin check.  Lung cancer screening. You may have  this screening every year starting at age 66 if you have a 30-pack-year history of smoking and currently smoke or have quit within the past 15 years.  Fecal occult blood test (FOBT) of the stool. You may have this test every year starting at age 5.  Flexible sigmoidoscopy or colonoscopy. You may have a sigmoidoscopy every 5 years or a colonoscopy every 10 years starting at age 29.  Hepatitis C blood test.  Hepatitis B blood test.  Sexually transmitted disease (STD) testing.  Diabetes screening. This is done by checking your blood sugar (glucose) after you have not eaten for a while (fasting). You may have this done every 1-3 years.  Bone density scan. This is done to screen for osteoporosis. You may have this done starting at age 30.  Mammogram. This may be done every 1-2 years. Talk to your health care provider about how often you should have regular mammograms.  Talk with your health care provider about your test results, treatment options, and if necessary, the need for more tests. Vaccines Your health care provider may recommend certain vaccines, such as:  Influenza vaccine. This is recommended every year.  Tetanus, diphtheria, and acellular pertussis (Tdap, Td) vaccine. You may need a Td booster every 10 years.  Varicella vaccine. You may need this if you have not been vaccinated.  Zoster vaccine. You may need this after age 73.  Measles, mumps, and rubella (MMR) vaccine. You may need at least one dose of MMR if you were born in 1957 or later. You may also need a  second dose.  Pneumococcal 13-valent conjugate (PCV13) vaccine. One dose is recommended after age 33.  Pneumococcal polysaccharide (PPSV23) vaccine. One dose is recommended after age 82.  Meningococcal vaccine. You may need this if you have certain conditions.  Hepatitis A vaccine. You may need this if you have certain conditions or if you travel or work in places where you may be exposed to hepatitis  A.  Hepatitis B vaccine. You may need this if you have certain conditions or if you travel or work in places where you may be exposed to hepatitis B.  Haemophilus influenzae type b (Hib) vaccine. You may need this if you have certain conditions.  Talk to your health care provider about which screenings and vaccines you need and how often you need them. This information is not intended to replace advice given to you by your health care provider. Make sure you discuss any questions you have with your health care provider. Document Released: 03/08/2015 Document Revised: 10/30/2015 Document Reviewed: 12/11/2014 Elsevier Interactive Patient Education  [employer].

## 2017-12-22 NOTE — Progress Notes (Signed)
Subjective:   Sheila [name] is a 77 y.o. female who presents for Medicare Annual (Subsequent) preventive examination.  Sheila [name] is accompanied today by her daughter.  She is widowed and lives alone normally, but her nephew has been staying with her recently, he plans to move out this week.  She has an home care aid that stays with her daily from 10 am - 4pm.  Her aid helps her bath, dress, clean her house, and cook meals.  Sheila [name] has 2 children, 3 grandchildren, and 2 great grandchildren.  She has 2 birds as pets.  She feels her health is unchanged from last year.  She reports no surgeries, hospitalizations, or ER visits in the past year.   Review of Systems:   Musculoskeletal- Right shoulder pain       Objective:     Vitals: BP 123/85   Pulse 92   Ht 4\' 11"  (1.499 m)   Wt 203 lb (92.1 kg)   BMI 41.00 kg/m   Body mass index is 41 kg/m.  Advanced Directives 12/22/2017 08/14/2016 04/16/2016 07/10/2015 12/29/2014 08/01/2013 07/31/2013  Does Patient Have a Medical Advance Directive? Yes Yes Yes No No Patient does not have advance directive Patient does not have advance directive;Patient would not like information  Type of Scientist, forensic Power of [place];Living will [place];Living will [place];Living will - - - -  Does patient want to make changes to medical advance directive? - No - Patient declined - - - - -  Copy of [place] in Chart? No - copy requested No - copy requested No - copy requested - - - -  Would patient like information on creating a medical advance directive? No - Patient declined - - Yes - Scientist, clinical (histocompatibility and immunogenetics) given - - -  Pre-existing out of facility DNR order (yellow form or pink MOST form) - - - - - - No    Tobacco Social History   Tobacco Use  Smoking Status Former Smoker  . Types: Cigarettes  . Last attempt to quit: 02/24/1968  . Years since quitting: 49.8  Smokeless Tobacco Former Systems developer  .  Types: Snuff  . Quit date: 11/23/2017  Tobacco Comment   quit yrs and yrs ago when she was very young     Counseling given: Not Answered Comment: quit yrs and yrs ago when she was very young Not counseled today  Clinical Intake:     Pain Score: 0-No pain                 Past Medical History:  Diagnosis Date  . Allergy   . Bell's palsy   . Cancer ([place])    skin CA on face  . Cataract   . Chest pain 07/2009   Myoveiw stress test negative.  . Depression   . Diverticulosis of colon   . DJD (degenerative joint disease) of knee   . Dyspnea    on exertion  . Family history of adverse reaction to anesthesia    sister ha nausea/vomiting  . Gastric ulcer with hemorrhage 01/14/2011  . GERD (gastroesophageal reflux disease)   . Headache   . History of fractured pelvis   . Hypercholesteremia   . Hypertension   . Hypothyroidism   . UTI (lower urinary tract infection)    Past Surgical History:  Procedure Laterality Date  . APPENDECTOMY    . BREAST BIOPSY Left   . ESOPHAGOGASTRODUODENOSCOPY  01/14/2011  Procedure: ESOPHAGOGASTRODUODENOSCOPY (EGD);  Surgeon: Rogene Houston, MD;  Location: [facility];  Service: Endoscopy;  Laterality: N/A;  . HIP SURGERY     pelvis  . JOINT REPLACEMENT    . KNEE SURGERY  04/2010.   Total left knee replacement  . LAPAROSCOPIC APPENDECTOMY N/A 08/01/2013   Procedure: APPENDECTOMY LAPAROSCOPIC;  Surgeon: Jamesetta So, MD;  Location: [facility] ORS;  Service: General;  Laterality: N/A;   Family History  Problem Relation Age of Onset  . Aneurysm Mother   . Stroke Mother   . Hypertension Mother   . Cancer Father        lung  . Heart disease Sister   . Hip fracture Sister   . Cancer Brother   . Alzheimer's disease Sister   . Cancer Sister        skin, uterine  . Cancer Brother    Social History   Socioeconomic History  . Marital status: Divorced    Spouse name: Not on file  . Number of children: Not on file  . Years of education:  Not on file  . Highest education level: Not on file  Occupational History  . Not on file  Social Needs  . Financial resource strain: Not on file  . Food insecurity:    Worry: Not on file    Inability: Not on file  . Transportation needs:    Medical: Not on file    Non-medical: Not on file  Tobacco Use  . Smoking status: Former Smoker    Types: Cigarettes    Last attempt to quit: 02/24/1968    Years since quitting: 49.8  . Smokeless tobacco: Former Systems developer    Types: Snuff    Quit date: 11/23/2017  . Tobacco comment: quit yrs and yrs ago when she was very young  Substance and Sexual Activity  . Alcohol use: No  . Drug use: No  . Sexual activity: Not on file  Lifestyle  . Physical activity:    Days per week: Not on file    Minutes per session: Not on file  . Stress: Not on file  Relationships  . Social connections:    Talks on phone: Not on file    Gets together: Not on file    Attends religious service: Not on file    Active member of club or organization: Not on file    Attends meetings of clubs or organizations: Not on file    Relationship status: Not on file  Other Topics Concern  . Not on file  Social History Narrative  . Not on file    Outpatient Encounter Medications as of 12/22/2017  Medication Sig  . albuterol (PROVENTIL) (2.5 MG/3ML) 0.083% nebulizer solution NEBULIZE 1 VIAL EVERY 6-8 HOURS AS NEEDED FOR WHEEZING OR SHORTNESS OF BREATH  . ALPRAZolam (XANAX) 0.5 MG tablet TAKE (1) TABLET TWICE A DAY.  . budesonide-formoterol (SYMBICORT) 80-4.5 MCG/ACT inhaler INHALE 2 PUFFS 2 TIMES A DAY  . denosumab (PROLIA) 60 MG/ML SOLN injection Inject 60 mg into the skin every 6 (six) months. Bring to office for administration.  . DULoxetine (CYMBALTA) 60 MG capsule Take 1 capsule (60 mg total) by mouth daily.  [name] estradiol (ESTRACE) 0.1 MG/GM vaginal cream Place 1 Applicatorful vaginally every Monday, Wednesday, and Friday. Reported on 08/07/2015  . furosemide (LASIX) 20 MG  tablet TAKE 1 TABLET DAILY  . gabapentin (NEURONTIN) 300 MG capsule TAKE (1) CAPSULE THREE TIMES DAILY.  . hydroxypropyl methylcellulose / hypromellose (ISOPTO  TEARS / GONIOVISC) 2.5 % ophthalmic solution Place 1 drop into both eyes as needed for dry eyes.  [name] levothyroxine (SYNTHROID, LEVOTHROID) 75 MCG tablet TAKE 1 TABLET DAILY  . LIVALO 2 MG TABS TAKE 1 TABLET DAILY  . meclizine (ANTIVERT) 25 MG tablet Take 1 tablet 3 times daily as needed for dizziness.  . Multiple Vitamins-Minerals (CENTRUM SILVER ADULT 50+ PO) Take 1 tablet by mouth every other day.  . pantoprazole (PROTONIX) 20 MG tablet TAKE 1 TABLET DAILY  . potassium chloride SA (K-DUR,KLOR-CON) 20 MEQ tablet TAKE (1) TABLET TWICE A DAY.  . traMADol (ULTRAM) 50 MG tablet Take 1 tablet (50 mg total) by mouth every 12 (twelve) hours as needed.  [name] azithromycin (ZITHROMAX) 250 MG tablet Take 2 the first day and then one each day after. (Patient not taking: Reported on 12/22/2017)  . benzonatate (TESSALON PERLES) 100 MG capsule Take 1 capsule (100 mg total) by mouth 3 (three) times daily as needed for cough. (Patient not taking: Reported on 12/22/2017)  . predniSONE (DELTASONE) 20 MG tablet 2 po at same time daily for 5 days (Patient not taking: Reported on 12/22/2017)   No facility-administered encounter medications on file as of 12/22/2017.     Activities of Daily Living In your present state of health, do you have any difficulty performing the following activities: 12/22/2017  Hearing? N  Vision? N  Difficulty concentrating or making decisions? Y  Comment Occassional trouble remembering things  Walking or climbing stairs? Y  Comment trouble walking long distances, and upstairs uses walker   Dressing or bathing? Y  Comment Has an aide that helps with dressing and bathing daily  Doing errands, shopping? Y  Comment Family members provide transportation to doctor's visits and grocery store   Preparing Food and eating ? Y  Comment  Aide prepares food, no problems with eating  Using the Toilet? N  In the past six months, have you accidently leaked urine? N  Do you have problems with loss of bowel control? N  Managing your Medications? Y  Comment Has trouble remembering evening dose, discussed ways to remember   Managing your Finances? Y  Comment Daughter helps with finances  Housekeeping or managing your Housekeeping? Y  Comment Aide helps with house work, lives in an apartment  Some recent data might be hidden    Patient Care Team: Sharion Balloon, FNP as PCP - General (Family Medicine) Rogene Houston, MD as Consulting Physician (Gastroenterology) Burnell Blanks, MD as Consulting Physician (Cardiology)    Assessment:   This is a routine wellness examination for Sheila [name].  Exercise Activities and Dietary recommendations  Sheila [name] eats 2 meals per day that are prepared by her aid that stays with her from 10 am - 4pm daily.  She usually does not eat breakfast, and eats vegetables such as stewed potatoes, mixed vegetables, pinto beans for lunch and supper.  Recommended diet of mostly vegetables, fruits, lean proteins, and whole grains.    Current Exercise Habits: Home exercise routine, Exercise limited by: orthopedic condition(s)   Sheila [name] states she has pedals that sit in the floor that she pedals on daily for 15 min- similar to recumbent bicycle    Goals    . Patient Stated (pt-stated)     Set an alarm or ask aid to help remember to take evening medications each day.        Fall Risk Fall Risk  12/22/2017 10/28/2017 09/09/2017 08/09/2017 06/08/2017  Falls in  the past year? No No No No No  Number falls in past yr: - - - - -  Injury with Fall? - - - - -  Comment - - - - -   Is the patient's home free of loose throw rugs in walkways, pet beds, electrical cords, etc?   yes      Grab bars in the bathroom? yes      Handrails on the stairs?   no stairs in home      Adequate lighting?    yes   Depression Screen PHQ 2/9 Scores 12/22/2017 12/09/2017 10/28/2017 09/09/2017  PHQ - 2 Score 0 0 0 0  PHQ- 9 Score - - - -     Cognitive Function MMSE - Mini Mental State Exam 12/22/2017 07/08/2016 07/10/2015 07/06/2014 07/06/2014  Not completed: Unable to complete Unable to complete Unable to complete - -  Orientation to time - 3 4 5 5   Orientation to Place - 3 5 2 2   Registration - 3 3 3 3   Attention/ Calculation - 0 0 0 0  Recall - 3 3 3 3   Language- name 2 objects - 2 2 2  -  Language- repeat - 1 1 1  -  Language- follow 3 step command - 3 3 3  -  Language- read & follow direction - 0 1 0 -  Language-read & follow direction-comments - - - patient is not able to compete this question because does not read -  Write a sentence - 0 0 0 -  Write a sentence-comments - - - unable to complete this question b/c does not read -  Copy design - 1 0 1 -  Total score - 19 22 20  -     6CIT Screen 12/22/2017 12/22/2017  What Year? 4 points 0 points  What month? 3 points 0 points  What time? - 3 points  Count back from 20 4 points 0 points  Months in reverse 4 points 0 points  Repeat phrase - 2 points  Total Score - 5   Total Score 17 -   Immunization History  Administered Date(s) Administered  . Influenza, High Dose Seasonal PF 02/10/2016, 12/15/2016, 12/09/2017  . Influenza,inj,Quad PF,6+ Mos 11/15/2012, 12/11/2013, 01/07/2015  . Pneumococcal Conjugate-13 07/06/2014  . Pneumococcal Polysaccharide-23 12/12/2007, 12/15/2016  . Tdap 02/23/2006    Qualifies for Shingles Vaccine? Yes, must get from pharmacy due to insurance coverage   Screening Tests Health Maintenance  Topic Date Due  . MAMMOGRAM  11/17/2018  . DEXA SCAN  12/23/2019  . INFLUENZA VACCINE  Completed  . PNA vac Low Risk Adult  Completed    Cancer Screenings: Lung: Low Dose CT Chest recommended if Age 46-80 years, 30 pack-year currently smoking OR have quit w/in 15years. Patient does not qualify. Breast:  Up to  date on Mammogram? No, scheduled for 01/04/18 Up to date of Bone Density/Dexa? Yes Colorectal: No declined   Additional Screenings:  Hepatitis C Screening:  Not indicated       Plan:     Work on your goal of remembering to take your medications each evening - by setting an alarm or having your daughter or aid to remind you. Bring a copy of your [place] will to our office to be filed in your chart.  Follow up with [name], FNP as scheduled    I have personally reviewed and noted the following in the patient's chart:   . Medical and social  history . Use of alcohol, tobacco or illicit drugs  . Current medications and supplements . Functional ability and status . Nutritional status . Physical activity . Advanced directives . List of other physicians . Hospitalizations, surgeries, and ER visits in previous 12 months . Vitals . Screenings to include cognitive, depression, and falls . Referrals and appointments  In addition, I have reviewed and discussed with patient certain preventive protocols, quality metrics, and best practice recommendations. A written personalized care plan for preventive services as well as general preventive health recommendations were provided to patient.     Denyce Robert, RN  12/22/2017

## 2018-01-04 DIAGNOSIS — Z1231 Encounter for screening mammogram for malignant neoplasm of breast: Secondary | ICD-10-CM | POA: Diagnosis not present

## 2018-01-04 LAB — HM MAMMOGRAPHY

## 2018-01-07 ENCOUNTER — Other Ambulatory Visit: Payer: Self-pay | Admitting: Family

## 2018-01-19 ENCOUNTER — Ambulatory Visit (INDEPENDENT_AMBULATORY_CARE_PROVIDER_SITE_OTHER): Payer: Medicare Other | Admitting: Pharmacist Clinician (PhC)/ Clinical Pharmacy Specialist

## 2018-01-19 VITALS — BP 120/78 | HR 80 | Ht 59.0 in | Wt 204.0 lb

## 2018-01-19 DIAGNOSIS — Z780 Asymptomatic menopausal state: Secondary | ICD-10-CM

## 2018-01-19 DIAGNOSIS — M810 Age-related osteoporosis without current pathological fracture: Secondary | ICD-10-CM | POA: Diagnosis not present

## 2018-01-19 DIAGNOSIS — M8580 Other specified disorders of bone density and structure, unspecified site: Secondary | ICD-10-CM

## 2018-01-19 NOTE — Progress Notes (Signed)
Patient is referred by [name] for osteopenia management with FRAX scores significant for treatment for osteoporosis.  Patient was on a bisphosphonate in the past with little clinical benefit when dexascans were checked.  She was changed to Prolia in 2013 but has not had a shot for the past 18 months.  Her T-score for her femur rapidly declined from -1.7 to -2.2.  Her spine score was relatively unchanged.  See note from [facility] on 01/09/2016 for full history and risk factors for osteoporosis.  A/P:  1.  Patient will start back on Prolia and plan on having a dexascan re-checked in 2 years.  The next step if her T-score is not stable would be Forteo.  2.  Patient is overdue to have her vitamin D level checked.  Her last one in 2017 was in the 20's and subtherapeutic.  I increased her Vitamin D today from 1000IU to 2000IU.  She also takes centrum silver and calcium citrate 1000mg  a day.  3.  Fall prevention counseling was covered at today's visit.  Total time with patient:  20 minutes  Memory Argue, PharmD, CPP, CLS

## 2018-01-21 ENCOUNTER — Other Ambulatory Visit: Payer: Self-pay | Admitting: Family

## 2018-02-07 ENCOUNTER — Other Ambulatory Visit: Payer: Self-pay | Admitting: Family

## 2018-02-10 ENCOUNTER — Telehealth: Payer: Self-pay

## 2018-02-10 NOTE — Telephone Encounter (Signed)
Prior authorization submitted Will contact pt when verified Pt's daughter notified

## 2018-02-10 NOTE — Telephone Encounter (Signed)
Needs to know if bone shot is ready?

## 2018-02-24 ENCOUNTER — Ambulatory Visit (INDEPENDENT_AMBULATORY_CARE_PROVIDER_SITE_OTHER): Payer: Medicare Other

## 2018-02-24 ENCOUNTER — Encounter: Payer: Self-pay | Admitting: Family Medicine

## 2018-02-24 ENCOUNTER — Ambulatory Visit (INDEPENDENT_AMBULATORY_CARE_PROVIDER_SITE_OTHER): Payer: Medicare Other | Admitting: Family Medicine

## 2018-02-24 VITALS — BP 134/86 | HR 79 | Temp 97.7°F | Ht 59.0 in | Wt 209.0 lb

## 2018-02-24 DIAGNOSIS — M19011 Primary osteoarthritis, right shoulder: Secondary | ICD-10-CM | POA: Insufficient documentation

## 2018-02-24 DIAGNOSIS — R062 Wheezing: Secondary | ICD-10-CM

## 2018-02-24 DIAGNOSIS — M7541 Impingement syndrome of right shoulder: Secondary | ICD-10-CM | POA: Diagnosis not present

## 2018-02-24 DIAGNOSIS — H66001 Acute suppurative otitis media without spontaneous rupture of ear drum, right ear: Secondary | ICD-10-CM

## 2018-02-24 DIAGNOSIS — J069 Acute upper respiratory infection, unspecified: Secondary | ICD-10-CM

## 2018-02-24 MED ORDER — METHYLPREDNISOLONE ACETATE 80 MG/ML IJ SUSP
80.0000 mg | Freq: Once | INTRAMUSCULAR | Status: AC
  Administered 2018-02-24: 80 mg via INTRAMUSCULAR

## 2018-02-24 MED ORDER — DICLOFENAC SODIUM 1 % TD GEL: 2.0000 g | Freq: Four times a day (QID) | TRANSDERMAL | 1 refills | Status: DC

## 2018-02-24 MED ORDER — AMOXICILLIN-POT CLAVULANATE 875-125 MG PO TABS: 1.0000 | ORAL_TABLET | Freq: Two times a day (BID) | ORAL | 0 refills | Status: AC

## 2018-02-24 NOTE — Patient Instructions (Addendum)
Shoulder Impingement Syndrome  Shoulder impingement syndrome is a condition that causes pain when connective tissues (tendons) surrounding the shoulder joint become pinched. These tendons are part of the group of muscles and tissues that help to stabilize the shoulder (rotator cuff). Beneath the rotator cuff is a fluid-filled sac (bursa) that allows the muscles and tendons to glide smoothly. The bursa may become swollen or irritated (bursitis). Bursitis, swelling in the rotator cuff tendons, or both conditions can decrease how much space is under a bone in the shoulder joint (acromion), resulting in impingement. What are the causes? Shoulder impingement syndrome may be caused by bursitis or swelling of the rotator cuff tendons, which may result from:  Repetitive overhead arm movements.  Falling onto the shoulder.  Weakness in the shoulder muscles. What increases the risk? You may be more likely to develop this condition if you:  Play sports that involve throwing, such as baseball.  Participate in sports such as tennis, volleyball, and swimming.  Work as a painter, carpenter, or fruit picker. Some people are also more likely to develop impingement syndrome because of the shape of their acromion bone. What are the signs or symptoms? The main symptom of this condition is pain on the front or side of the shoulder. The pain may:  Get worse when lifting or raising the arm.  Get worse at night.  Wake you up from sleeping.  Feel sharp when the shoulder is moved and then fade to an ache. Other symptoms may include:  Tenderness.  Stiffness.  Inability to raise the arm above shoulder level or behind the body.  Weakness. How is this diagnosed? This condition may be diagnosed based on:  Your symptoms and medical history.  A physical exam.  Imaging tests, such as: ? X-rays. ? MRI. ? Ultrasound. How is this treated? This condition may be treated by:  Resting your shoulder and  avoiding all activities that cause pain or put stress on the shoulder.  Icing your shoulder.  NSAIDs to help reduce pain and swelling.  One or more injections of medicines to numb the area and reduce inflammation.  Physical therapy.  Surgery. This may be needed if nonsurgical treatments have not helped. Surgery may involve repairing the rotator cuff, reshaping the acromion, or removing the bursa. Follow these instructions at home: Managing pain, stiffness, and swelling   If directed, put ice on the injured area. ? Put ice in a plastic bag. ? Place a towel between your skin and the bag. ? Leave the ice on for 20 minutes, 2-3 times a day. Activity  Rest and return to your normal activities as told by your health care provider. Ask your health care provider what activities are safe for you.  Do exercises as told by your health care provider. General instructions  Do not use any products that contain nicotine or tobacco, such as cigarettes, e-cigarettes, and chewing tobacco. These can delay healing. If you need help quitting, ask your health care provider.  Ask your health care provider when it is safe for you to drive.  Take over-the-counter and prescription medicines only as told by your health care provider.  Keep all follow-up visits as told by your health care provider. This is important. How is this prevented?  Give your body time to rest between periods of activity.  Be safe and responsible while being active. This will help you avoid falls.  Maintain physical fitness, including strength and flexibility. Contact a health care provider if:  Your   symptoms have not improved after 1-2 months of treatment and rest.  You cannot lift your arm away from your body. Summary  Shoulder impingement syndrome is a condition that causes pain when connective tissues (tendons) surrounding the shoulder joint become pinched.  The main symptom of this condition is pain on the front or  side of the shoulder.  This condition is usually treated with rest, ice, and pain medicines as needed. This information is not intended to replace advice given to you by your health care provider. Make sure you discuss any questions you have with your health care provider. Document Released: 02/09/2005 Document Revised: 08/04/2017 Document Reviewed: 08/04/2017 Elsevier Interactive Patient Education  2019 [employer].  Otitis Media, Adult  Otitis media means that the middle ear is red and swollen (inflamed) and full of fluid. The condition usually goes away on its own. Follow these instructions at home:  Take over-the-counter and prescription medicines only as told by your doctor.  If you were prescribed an antibiotic medicine, take it as told by your doctor. Do not stop taking the antibiotic even if you start to feel better.  Keep all follow-up visits as told by your doctor. This is important. Contact a doctor if:  You have bleeding from your nose.  There is a lump on your neck.  You are not getting better in 5 days.  You feel worse instead of better. Get help right away if:  You have pain that is not helped with medicine.  You have swelling, redness, or pain around your ear.  You get a stiff neck.  You cannot move part of your face (paralyzed).  You notice that the bone behind your ear hurts when you touch it.  You get a very bad headache. Summary  Otitis media means that the middle ear is red, swollen, and full of fluid.  This condition usually goes away on its own. In some cases, treatment may be needed.  If you were prescribed an antibiotic medicine, take it as told by your doctor. This information is not intended to replace advice given to you by your health care provider. Make sure you discuss any questions you have with your health care provider. Document Released: 07/29/2007 Document Revised: 03/02/2016 Document Reviewed: 03/02/2016 Elsevier Interactive  Patient Education  2019 [employer].  Upper Respiratory Infection, Adult An upper respiratory infection (URI) affects the nose, throat, and upper air passages. URIs are caused by germs (viruses). The most common type of URI is often called "the common cold." Medicines cannot cure URIs, but you can do things at home to relieve your symptoms. URIs usually get better within 7-10 days. Follow these instructions at home: Activity  Rest as needed.  If you have a fever, stay home from work or school until your fever is gone, or until your doctor says you may return to work or school. ? You should stay home until you cannot spread the infection anymore (you are not contagious). ? Your doctor may have you wear a face mask so you have less risk of spreading the infection. Relieving symptoms  Gargle with a salt-water mixture 3-4 times a day or as needed. To make a salt-water mixture, completely dissolve -1 tsp of salt in 1 cup of warm water.  Use a cool-mist humidifier to add moisture to the air. This can help you breathe more easily. Eating and drinking   Drink enough fluid to keep your pee (urine) pale yellow.  Eat soups and other clear  broths. General instructions   Take over-the-counter and prescription medicines only as told by your doctor. These include cold medicines, fever reducers, and cough suppressants.  Do not use any products that contain nicotine or tobacco. These include cigarettes and e-cigarettes. If you need help quitting, ask your doctor.  Avoid being where people are smoking (avoid secondhand smoke).  Make sure you get regular shots and get the flu shot every year.  Keep all follow-up visits as told by your doctor. This is important. How to avoid spreading infection to others   Wash your hands often with soap and water. If you do not have soap and water, use hand sanitizer.  Avoid touching your mouth, face, eyes, or nose.  Cough or sneeze into a tissue or your  sleeve or elbow. Do not cough or sneeze into your hand or into the air. Contact a doctor if:  You are getting worse, not better.  You have any of these: ? A fever. ? Chills. ? Brown or red mucus in your nose. ? Yellow or brown fluid (discharge)coming from your nose. ? Pain in your face, especially when you bend forward. ? Swollen neck glands. ? Pain with swallowing. ? White areas in the back of your throat. Get help right away if:  You have shortness of breath that gets worse.  You have very bad or constant: ? Headache. ? Ear pain. ? Pain in your forehead, behind your eyes, and over your cheekbones (sinus pain). ? Chest pain.  You have long-lasting (chronic) lung disease along with any of these: ? Wheezing. ? Long-lasting cough. ? Coughing up blood. ? A change in your usual mucus.  You have a stiff neck.  You have changes in your: ? Vision. ? Hearing. ? Thinking. ? Mood. Summary  An upper respiratory infection (URI) is caused by a germ called a virus. The most common type of URI is often called "the common cold."  URIs usually get better within 7-10 days.  Take over-the-counter and prescription medicines only as told by your doctor. This information is not intended to replace advice given to you by your health care provider. Make sure you discuss any questions you have with your health care provider. Document Released: 07/29/2007 Document Revised: 10/02/2016 Document Reviewed: 10/02/2016 Elsevier Interactive Patient Education  2019 [employer].

## 2018-02-24 NOTE — Progress Notes (Addendum)
Subjective:    Patient ID: Sheila [name], female    DOB: 1940/05/20, 78 y.o.   MRN: [id number]  Chief Complaint:  Right arm pain (radiates from shoulder to elbow, ongoing for several months, pain is worsening) and Cough, chest congestion (x 3 weeks, has not taken anything OTC)   HPI: Sheila [name] is a 78 y.o. female presenting on 02/24/2018 for Right arm pain (radiates from shoulder to elbow, ongoing for several months, pain is worsening) and Cough, chest congestion (x 3 weeks, has not taken anything OTC)  Pt presents today with complaints of right shoulder pain and cough and congestion. Pt states the shoulder pain started over 4 weeks ago. She denies injury. She states the pain is worse with lifting her arm above her head and pulling up her pants. States she is still able to use her arm, but it hurts to raise it past her head. She denies numbness, tingling, or loss of function. She has tried tylenol without relief of symptoms.  Pt states the cough and congestion started 2-3 weeks ago and is getting worse. Pt states she has not tired anything for her symptoms. She states she has pressure in her ears and fatigue. She denies fever, chills, weakness, chest pian, or shortness of breath. She states she has had wheezing and rhinorrhea with the symptoms.   Relevant past medical, surgical, family, and social history reviewed and updated as indicated.  Allergies and medications reviewed and updated.   Past Medical History:  Diagnosis Date  . Allergy   . Bell's palsy   . 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 Cataract   . Chest pain 07/2009   Myoveiw stress test negative.  . Depression   . Diverticulosis of colon   . DJD (degenerative joint disease) of knee   . Dyspnea    on exertion  . Family history of adverse reaction to anesthesia    sister ha nausea/vomiting  . Gastric ulcer with hemorrhage 01/14/2011  . GERD (gastroesophageal reflux disease)   . Headache   . History of fractured pelvis     . Hypercholesteremia   . Hypertension   . Hypothyroidism   . UTI (lower urinary tract infection)     Past Surgical History:  Procedure Laterality Date  . APPENDECTOMY    . BREAST BIOPSY Left   . ESOPHAGOGASTRODUODENOSCOPY  01/14/2011   Procedure: ESOPHAGOGASTRODUODENOSCOPY (EGD);  Surgeon: Rogene Houston, MD;  Location: [facility];  Service: Endoscopy;  Laterality: N/A;  . HIP SURGERY     pelvis  . JOINT REPLACEMENT    . KNEE SURGERY  04/2010.   Total left knee replacement  . LAPAROSCOPIC APPENDECTOMY N/A 08/01/2013   Procedure: APPENDECTOMY LAPAROSCOPIC;  Surgeon: Jamesetta So, MD;  Location: [facility];  Service: General;  Laterality: N/A;    Social History   Socioeconomic History  . Marital status: Divorced    Spouse name: Not on file  . Number of children: Not on file  . Years of education: Not on file  . Highest education level: Not on file  Occupational History  . Not on file  Social Needs  . Financial resource strain: Not on file  . Food insecurity:    Worry: Not on file    Inability: Not on file  . Transportation needs:    Medical: Not on file    Non-medical: Not on file  Tobacco Use  . Smoking status: Former Smoker  Types: Cigarettes    Last attempt to quit: 02/24/1968    Years since quitting: 50.0  . Smokeless tobacco: Former Systems developer    Types: Snuff    Quit date: 11/23/2017  . Tobacco comment: quit yrs and yrs ago when she was very young  Substance and Sexual Activity  . Alcohol use: No  . Drug use: No  . Sexual activity: Not on file  Lifestyle  . Physical activity:    Days per week: Not on file    Minutes per session: Not on file  . Stress: Not on file  Relationships  . Social connections:    Talks on phone: Not on file    Gets together: Not on file    Attends religious service: Not on file    Active member of club or organization: Not on file    Attends meetings of clubs or organizations: Not on file    Relationship status: Not on file  .  Intimate partner violence:    Fear of current or ex partner: Not on file    Emotionally abused: Not on file    Physically abused: Not on file    Forced sexual activity: Not on file  Other Topics Concern  . Not on file  Social History Narrative  . Not on file    Outpatient Encounter Medications as of 02/24/2018  Medication Sig  . albuterol (PROVENTIL) (2.5 MG/3ML) 0.083% nebulizer solution NEBULIZE 1 VIAL EVERY 6-8 HOURS AS NEEDED FOR WHEEZING OR SHORTNESS OF BREATH  . ALPRAZolam (XANAX) 0.5 MG tablet TAKE (1) TABLET TWICE A DAY.  . budesonide-formoterol (SYMBICORT) 80-4.5 MCG/ACT inhaler INHALE 2 PUFFS 2 TIMES A DAY  . citalopram (CELEXA) 20 MG tablet TAKE 1 TABLET DAILY  . denosumab (PROLIA) 60 MG/ML SOLN injection Inject 60 mg into the skin every 6 (six) months. Bring to office for administration.  . DULoxetine (CYMBALTA) 60 MG capsule Take 1 capsule (60 mg total) by mouth daily.  [name] estradiol (ESTRACE) 0.1 MG/GM vaginal cream Place 1 Applicatorful vaginally every Monday, Wednesday, and Friday. Reported on 08/07/2015  . furosemide (LASIX) 20 MG tablet TAKE 1 TABLET DAILY  . gabapentin (NEURONTIN) 300 MG capsule TAKE (1) CAPSULE THREE TIMES DAILY.  . hydroxypropyl methylcellulose / hypromellose (ISOPTO TEARS / GONIOVISC) 2.5 % ophthalmic solution Place 1 drop into both eyes as needed for dry eyes.  [name] levothyroxine (SYNTHROID, LEVOTHROID) 75 MCG tablet TAKE 1 TABLET DAILY  . LIVALO 2 MG TABS TAKE 1 TABLET DAILY  . meclizine (ANTIVERT) 25 MG tablet Take 1 tablet 3 times daily as needed for dizziness.  . Multiple Vitamins-Minerals (CENTRUM SILVER ADULT 50+ PO) Take 1 tablet by mouth every other day.  . pantoprazole (PROTONIX) 20 MG tablet TAKE 1 TABLET DAILY  . potassium chloride SA (K-DUR,KLOR-CON) 20 MEQ tablet TAKE (1) TABLET TWICE A DAY.  . traMADol (ULTRAM) 50 MG tablet Take 1 tablet (50 mg total) by mouth every 12 (twelve) hours as needed.  [name] amoxicillin-clavulanate (AUGMENTIN) 875-125  MG tablet Take 1 tablet by mouth 2 (two) times daily for 7 days.  . diclofenac sodium (VOLTAREN) 1 % GEL Apply 2 g topically 4 (four) times daily.  . [DISCONTINUED] azithromycin (ZITHROMAX) 250 MG tablet Take 2 the first day and then one each day after. (Patient not taking: Reported on 12/22/2017)  . [DISCONTINUED] benzonatate (TESSALON PERLES) 100 MG capsule Take 1 capsule (100 mg total) by mouth 3 (three) times daily as needed for cough. (Patient not taking: Reported on  12/22/2017)  . [DISCONTINUED] predniSONE (DELTASONE) 20 MG tablet 2 po at same time daily for 5 days (Patient not taking: Reported on 12/22/2017)   Facility-Administered Encounter Medications as of 02/24/2018  Medication  . methylPREDNISolone acetate (DEPO-MEDROL) injection 80 mg    Allergies  Allergen Reactions  . Ibuprofen     History of bleeding ulcers - contra-indicated   . Benadryl [Diphenhydramine Hcl] Other (See Comments)    Worsening depression   . Lipitor [Atorvastatin] Other (See Comments)    Body aches.  . Hydrocodone     Nausea and vomiting  . Codeine Nausea And Vomiting  . Morphine And Related Nausea And Vomiting  . Tdap [Tetanus-Diphth-Acell Pertussis] Swelling    Redness and localized swelling at injection site     Review of Systems  Constitutional: Positive for fatigue. Negative for chills and fever.  HENT: Positive for congestion, ear pain, postnasal drip, rhinorrhea and sore throat. Negative for sinus pressure, sinus pain and tinnitus.   Respiratory: Positive for cough and wheezing. Negative for chest tightness and shortness of breath.   Cardiovascular: Negative for chest pain, palpitations and leg swelling.  Gastrointestinal: Negative for abdominal pain, constipation, diarrhea, nausea and vomiting.  Musculoskeletal: Positive for arthralgias (right shoulder ). Negative for joint swelling, myalgias, neck pain and neck stiffness.  Neurological: Negative for dizziness, weakness, light-headedness and  headaches.  Psychiatric/Behavioral: Negative for confusion.  All other systems reviewed and are negative.       Objective:    BP 134/86   Pulse 79   Temp 97.7 F (36.5 C) (Oral)   Ht 4\' 11"  (1.499 m)   Wt 209 lb (94.8 kg)   SpO2 96%   BMI 42.21 kg/m    Wt Readings from Last 3 Encounters:  02/24/18 209 lb (94.8 kg)  01/19/18 204 lb (92.5 kg)  12/22/17 203 lb (92.1 kg)    Physical Exam Vitals signs and nursing note reviewed.  Constitutional:      General: She is in acute distress (mild).     Appearance: She is well-developed and well-groomed. She is obese.  HENT:     Head: Normocephalic and atraumatic.     Jaw: There is normal jaw occlusion.     Right Ear: Hearing, ear canal and external ear normal. Tympanic membrane is erythematous and bulging. Tympanic membrane is not perforated.     Left Ear: Hearing, tympanic membrane, ear canal and external ear normal. Tympanic membrane is not perforated, erythematous or bulging.     Nose: Nose normal.     Mouth/Throat:     Lips: Pink.     Mouth: Mucous membranes are moist.     Pharynx: Oropharynx is clear. Uvula midline. Posterior oropharyngeal erythema present. No pharyngeal swelling, oropharyngeal exudate or uvula swelling.     Tonsils: No tonsillar exudate.  Eyes:     General: Lids are normal.     Conjunctiva/sclera: Conjunctivae normal.     Pupils: Pupils are equal, round, and reactive to light.  Neck:     Musculoskeletal: Full passive range of motion without pain and neck supple.     Thyroid: No thyroid mass, thyromegaly or thyroid tenderness.     Trachea: Trachea and phonation normal.  Cardiovascular:     Rate and Rhythm: Normal rate and regular rhythm.     Pulses: Normal pulses.     Heart sounds: Normal heart sounds. No friction rub. No gallop.   Pulmonary:     Effort: Pulmonary effort is normal. No respiratory distress.  Breath sounds: Wheezing (mild, scattered) present.  Musculoskeletal:     Right shoulder: She  exhibits decreased range of motion (abduction and adduction), tenderness and pain. She exhibits no bony tenderness, no swelling, no effusion, no crepitus, no deformity, no laceration, no spasm, normal pulse and normal strength.     Comments: Right shoulder: positive Hawkins and Neer sign, negative drop arm sign  Lymphadenopathy:     Cervical: Cervical adenopathy (mild) present.  Skin:    General: Skin is warm and dry.     Capillary Refill: Capillary refill takes less than 2 seconds.  Neurological:     General: No focal deficit present.     Mental Status: She is alert.     Sensory: Sensation is intact.     Motor: Motor function is intact.  Psychiatric:        Attention and Perception: Attention and perception normal.        Mood and Affect: Mood and affect normal.        Speech: Speech normal.        Behavior: Behavior is cooperative.        Thought Content: Thought content normal.        Cognition and Memory: Cognition and memory normal.        Judgment: Judgment normal.     Results for orders placed or performed in visit on 01/12/18  [facility] MAMMOGRAPHY  Result Value Ref Range   [facility] Mammogram 0-4 Bi-Rad 0-4 Bi-Rad, Self Reported Normal   X-Ray: right shoulder: Degenerative changes. No acute findings. Preliminary x-ray reading by Monia Pouch, FNP-C, [facility].   Pertinent labs & imaging results that were available during my care of the patient were reviewed by me and considered in my medical decision making.  Assessment & Plan:  Altamese was seen today for right arm pain and cough, chest congestion.  Diagnoses and all orders for this visit:  Impingement syndrome of right shoulder No acute findings on shoulder xray, will notify if radiology reading differs. Due to intolerance to oral NSAIDs, will use Voltaren gel. Rest and ice. Avoid aggravating activities. Referral to physical therapy. Report any new or worsening symptoms.  -     DG Shoulder Right; Future -     diclofenac sodium  (VOLTAREN) 1 % GEL; Apply 2 g topically 4 (four) times daily. -     Ambulatory referral to Physical Therapy  URI with cough and congestion Increase fluid intake, increase humidity in the air, and avoid cigarette smoke and other triggers. Report any new or worsening symptoms.  -     methylPREDNISolone acetate (DEPO-MEDROL) injection 80 mg  Wheezing Continue Symbicort and albuterol as prescribed. Report any new or worsening symptoms.  -     methylPREDNISolone acetate (DEPO-MEDROL) injection 80 mg  Non-recurrent acute suppurative otitis media of right ear without spontaneous rupture of tympanic membrane Medications as prescribed. Can use tylenol as needed for fever and pain control. Report any new or worsening symptoms.  -     amoxicillin-clavulanate (AUGMENTIN) 875-125 MG tablet; Take 1 tablet by mouth 2 (two) times daily for 7 days.     Continue all other maintenance medications.  Follow up plan: Return in about 4 weeks (around 03/24/2018), or if symptoms worsen or fail to improve.  Educational handout given for shoulder impingement, URI, otitis media   The above assessment and management plan was discussed with the patient. The patient verbalized understanding of and has agreed to the management plan. Patient is aware to  call the clinic if symptoms persist or worsen. Patient is aware when to return to the clinic for a follow-up visit. Patient educated on when it is appropriate to go to the emergency department.   Monia Pouch, FNP-C [facility] [phone]

## 2018-03-01 NOTE — Telephone Encounter (Signed)
Insurance verification resubmitted for new calendar year

## 2018-03-08 ENCOUNTER — Other Ambulatory Visit: Payer: Self-pay | Admitting: Family

## 2018-03-08 ENCOUNTER — Encounter: Payer: Self-pay | Admitting: Physical Therapy

## 2018-03-08 ENCOUNTER — Other Ambulatory Visit: Payer: Self-pay

## 2018-03-08 ENCOUNTER — Ambulatory Visit: Payer: Medicare Other | Attending: Family Medicine | Admitting: Physical Therapy

## 2018-03-08 DIAGNOSIS — F411 Generalized anxiety disorder: Secondary | ICD-10-CM

## 2018-03-08 DIAGNOSIS — M6281 Muscle weakness (generalized): Secondary | ICD-10-CM

## 2018-03-08 DIAGNOSIS — M25511 Pain in right shoulder: Secondary | ICD-10-CM

## 2018-03-08 NOTE — Therapy (Signed)
[facility] [place], [place], [address] Phone: [phone]   Fax:  [phone]  Physical Therapy Evaluation  Patient Details  Name: Sheila [name] MRN: [id number] Date of Birth: 28-Feb-1940 Referring Provider (PT): Darla Lesches, FNP   Encounter Date: 03/08/2018  PT End of Session - 03/08/18 2045    Visit Number  1    Number of Visits  12    Date for PT Re-Evaluation  04/26/18    Authorization Type  Progress note every 10th visit    PT Start Time  1115    PT Stop Time  1202    PT Time Calculation (min)  47 min    Activity Tolerance  Patient tolerated treatment well    Behavior During Therapy  [facility] for tasks assessed/performed       Past Medical History:  Diagnosis Date  . Allergy   . Bell's palsy   . 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 Cataract   . Chest pain 07/2009   Myoveiw stress test negative.  . Depression   . Diverticulosis of colon   . DJD (degenerative joint disease) of knee   . Dyspnea    on exertion  . Family history of adverse reaction to anesthesia    sister ha nausea/vomiting  . Gastric ulcer with hemorrhage 01/14/2011  . GERD (gastroesophageal reflux disease)   . Headache   . History of fractured pelvis   . Hypercholesteremia   . Hypertension   . Hypothyroidism   . UTI (lower urinary tract infection)     Past Surgical History:  Procedure Laterality Date  . APPENDECTOMY    . BREAST BIOPSY Left   . ESOPHAGOGASTRODUODENOSCOPY  01/14/2011   Procedure: ESOPHAGOGASTRODUODENOSCOPY (EGD);  Surgeon: Rogene Houston, MD;  Location: [facility];  Service: Endoscopy;  Laterality: N/A;  . HIP SURGERY     pelvis  . JOINT REPLACEMENT    . KNEE SURGERY  04/2010.   Total left knee replacement  . LAPAROSCOPIC APPENDECTOMY N/A 08/01/2013   Procedure: APPENDECTOMY LAPAROSCOPIC;  Surgeon: Jamesetta So, MD;  Location: [facility] ORS;  Service: General;  Laterality: N/A;    There were no vitals filed for this  visit.   Subjective Assessment - 03/08/18 2037    Subjective  Patient arrives to therapy with reports of right shoulder pain and loss of motion that began insidiously in December 2019. Patient denies any injury to the shoulder but pain as progressively worsened. Patient reports pain can radiate to elbow and denies numbness and tingling. Patient reports she has difficulties with ADLs and home activities but has a home health aide that assists her 5x per week for 6 hours. Patient has difficulties with bathing, toileting, and dressing due to pain and loss of range of motion. Patient reports pain at worst is "severe" and pain at best is 0/10 while sleeping or laying down and not using it. Patient's goals are to decrease pain, improve movement, and have less difficulties with home activities.    Pertinent History  COPD    Limitations  Lifting;House hold activities    Diagnostic tests  x-ray: advanced glenohumeral OA, mild/moderate AC OA    Patient Stated Goals  get arm better and not in pain    Currently in Pain?  Yes    Pain Score  3     Pain Location  Shoulder    Pain Orientation  Right    Pain Descriptors / Indicators  Discomfort    Pain Type  Acute pain    Pain Onset  More than a month ago    Pain Frequency  Intermittent    Aggravating Factors   "using it"    Pain Relieving Factors  "sleeping and rest"    Effect of Pain on Daily Activities  "can't do much activities because of pain"         [facility] PT Assessment - 03/08/18 0001      Assessment   Medical Diagnosis  impingement of right shoulder    Referring Provider (PT)  Darla Lesches, FNP    Onset Date/Surgical Date  --   1 month, December 2019   Hand Dominance  Right    Next MD Visit  unsure    Prior Therapy  no      Precautions   Precautions  None      Restrictions   Weight Bearing Restrictions  No      Balance Screen   Has the patient fallen in the past 6 months  No    Has the patient had a decrease in activity level because  of a fear of falling?   No    Is the patient reluctant to leave their home because of a fear of falling?   No      Home Environment   Living Environment  Private residence    Living Arrangements  Other relatives   Home health aide 5x per week 10 am-4 pm;     Prior Function   Level of Independence  Needs assistance with homemaking;Independent with basic ADLs      Posture/Postural Control   Posture/Postural Control  Postural limitations    Postural Limitations  Forward head;Rounded Shoulders;Increased thoracic kyphosis      ROM / Strength   AROM / PROM / Strength  AROM;PROM;Strength      AROM   Overall AROM   Deficits;Due to pain    Overall AROM Comments  WFL left AROM    AROM Assessment Site  Shoulder    Right/Left Shoulder  Right    Right Shoulder Flexion  118 Degrees    Right Shoulder ABduction  80 Degrees      PROM   Overall PROM   Deficits;Due to pain    PROM Assessment Site  Shoulder    Right/Left Shoulder  Right    Right Shoulder Flexion  107 Degrees    Right Shoulder ABduction  110 Degrees    Right Shoulder Internal Rotation  64 Degrees    Right Shoulder External Rotation  10 Degrees      Strength   Strength Assessment Site  Shoulder    Right/Left Shoulder  Right    Right Shoulder Flexion  3-/5    Right Shoulder ABduction  3-/5    Right Shoulder Internal Rotation  3/5    Right Shoulder External Rotation  3-/5      Palpation   Palpation comment  very tender to palpation to posterolateral shoulder, anterior shoulder, inferior angle of the right shoulder;  tenderness to right biceps and triceps      Special Tests    Special Tests  Rotator Cuff Impingement    Rotator Cuff Impingment tests  Michel Bickers test      Hawkins-Kennedy test   Findings  Positive    Side  Right    Comments  reproduction of pain      Ambulation/Gait   Assistive device  Rollator    Gait Pattern  Step-through pattern;Decreased stride length                Objective  measurements completed on examination: See above findings.              PT Education - 03/08/18 1409    Education Details  scapular retractions, shoulder rolls, chin tucks,     Person(s) Educated  Patient    Methods  Explanation;Demonstration;Handout    Comprehension  Verbalized understanding;Returned demonstration       PT Short Term Goals - 03/08/18 2050      PT SHORT TERM GOAL #1   Title  STG=LTG        PT Long Term Goals - 03/08/18 2050      PT LONG TERM GOAL #1   Title  Patient will be independent with HEP    Time  6    Period  Weeks    Status  New      PT LONG TERM GOAL #2   Title  Patient will demonstrate 130+ degrees of right shoulder flexion AROM to improve ability to perform functional tasks.    Time  6    Period  Weeks    Status  New      PT LONG TERM GOAL #3   Title  Patient will demonstrate 50+ degrees of right shoulder ER AROM to improve ability to don/doff apparel.    Time  6    Period  Weeks    Status  New      PT LONG TERM GOAL #4   Title  Patient will report ability to perform ADLs with right shoulder pain less than or equal to 5/10.    Time  6    Period  Weeks    Status  New             Plan - 03/08/18 2047    Clinical Impression Statement  Patient is a 78 year old female who presents to physical therapy with right shoulder pain, loss of right shoulder AROM and PROM. Patient noted with increased guarding during PROM despite oscillations to promote muscle relaxation. Patient is very tender to palpation along right posterolateral shoulder, right inferior angle, and down right biceps. Patient ambulates with a rollator and denies pain with UE support. Patient educated on OA and importance of "motion is lotion." Patient reported understanding. Patient would benefit from skilled physical therapy to address deficits and address patient's goals.     History and Personal Factors relevant to plan of care:  COPD    Clinical Presentation   Evolving    Clinical Presentation due to:  not improving    Clinical Decision Making  Low    Rehab Potential  Good    PT Frequency  2x / week    PT Duration  6 weeks    PT Treatment/Interventions  ADLs/Self Care Home Management;Cryotherapy;Moist Heat;Iontophoresis 4mg /ml Dexamethasone;Electrical Stimulation;Ultrasound;Functional mobility training;Neuromuscular re-education;Therapeutic exercise;Therapeutic activities;Patient/family education;Manual techniques;Passive range of motion;Vasopneumatic Device    PT Next Visit Plan  AAROM to right shoulder, gentle strengthening, STW/M, modalities for pain relief.    PT Home Exercise Plan  see patient education section    Consulted and Agree with Plan of Care  Patient       Patient will benefit from skilled therapeutic intervention in order to improve the following deficits and impairments:  Pain, Postural dysfunction, Impaired UE functional use, Decreased strength, Decreased range of motion, Decreased activity tolerance  Visit Diagnosis: Acute pain of right shoulder - Plan:  PT plan of care cert/re-cert  Muscle weakness (generalized) - Plan: PT plan of care cert/re-cert     Problem List Patient Active Problem List   Diagnosis Date Noted  . Osteoarthritis of right shoulder 02/24/2018  . Chronic bilateral low back pain 12/09/2017  . Smokeless tobacco use 11/11/2017  . COPD (chronic obstructive pulmonary disease) ([place]) 09/09/2017  . Unilateral primary osteoarthritis, right knee 03/04/2017  . Spondylolisthesis of lumbar region 08/24/2016  . Opioid dependence ([place]) 04/29/2015  . Pain medication agreement signed 04/29/2015  . Chronic back pain 04/29/2015  . Osteoarthritis 04/29/2015  . Metabolic syndrome 80/16/5537  . Morbid obesity ([place]) 10/18/2014  . Neuropathy 10/18/2014  . Hypokalemia 10/18/2014  . Depressed 09/05/2013  . Osteoporosis 06/16/2012  . PUD (peptic ulcer disease) 06/16/2012  . GAD (generalized anxiety disorder) 06/16/2012   . Hyperlipidemia 06/16/2012  . DJD (degenerative joint disease) 06/16/2012  . Hypercalcemia 06/16/2012  . Essential hypertension, benign 06/16/2012  . Gastric ulcer with hemorrhage 01/14/2011  . Diverticulosis of colon 01/13/2011  . Prerenal azotemia 01/13/2011  . Leukocytosis 01/13/2011  . Sacral insufficiency fracture 01/13/2011  . Hypothyroidism 01/13/2011  . Orthostatic lightheadedness 01/13/2011  . ABNORMAL EKG 07/16/2009   Gabriela Eves, PT, DPT 03/08/2018, 9:17 PM  [facility] [place], [place], [address] Phone: [phone]   Fax:  [phone]  Name: Sheila [name] MRN: [id number] Date of Birth: 02-Nov-1940

## 2018-03-10 ENCOUNTER — Encounter: Payer: Self-pay | Admitting: Physical Therapy

## 2018-03-10 ENCOUNTER — Ambulatory Visit: Payer: Medicare Other | Admitting: Physical Therapy

## 2018-03-10 DIAGNOSIS — M6281 Muscle weakness (generalized): Secondary | ICD-10-CM | POA: Diagnosis not present

## 2018-03-10 DIAGNOSIS — M25511 Pain in right shoulder: Secondary | ICD-10-CM

## 2018-03-10 NOTE — Therapy (Signed)
[facility] [place], [place], [address] Phone: [phone]   Fax:  [phone]  Physical Therapy Treatment  Patient Details  Name: Sheila [name] MRN: [id number] Date of Birth: Feb 03, 1941 Referring Provider (PT): Darla Lesches, FNP   Encounter Date: 03/10/2018  PT End of Session - 03/10/18 1349    Visit Number  2    Number of Visits  12    Date for PT Re-Evaluation  04/26/18    Authorization Type  Progress note every 10th visit    PT Start Time  1348    PT Stop Time  1431    PT Time Calculation (min)  43 min    Activity Tolerance  Patient tolerated treatment well    Behavior During Therapy  [facility] for tasks assessed/performed       Past Medical History:  Diagnosis Date  . Allergy   . Bell's palsy   . Cancer ([place])    skin CA on face  . Cataract   . Chest pain 07/2009   Myoveiw stress test negative.  . Depression   . Diverticulosis of colon   . DJD (degenerative joint disease) of knee   . Dyspnea    on exertion  . Family history of adverse reaction to anesthesia    sister ha nausea/vomiting  . Gastric ulcer with hemorrhage 01/14/2011  . GERD (gastroesophageal reflux disease)   . Headache   . History of fractured pelvis   . Hypercholesteremia   . Hypertension   . Hypothyroidism   . UTI (lower urinary tract infection)     Past Surgical History:  Procedure Laterality Date  . APPENDECTOMY    . BREAST BIOPSY Left   . ESOPHAGOGASTRODUODENOSCOPY  01/14/2011   Procedure: ESOPHAGOGASTRODUODENOSCOPY (EGD);  Surgeon: Rogene Houston, MD;  Location[facility];  Service: Endoscopy;  Laterality: N/A;  . HIP SURGERY     pelvis  . JOINT REPLACEMENT    . KNEE SURGERY  04/2010.   Total left knee replacement  . LAPAROSCOPIC APPENDECTOMY N/A 08/01/2013   Procedure: APPENDECTOMY LAPAROSCOPIC;  Surgeon: Jamesetta So, MD;  Location: [facility];  Service: General;  Laterality: N/A;    There were no vitals filed for this  visit.  Subjective Assessment - 03/10/18 1348    Subjective  Reports that her shoulder is much better and does her HEP 2-3 x per day.    Pertinent History  COPD    Limitations  Lifting;House hold activities    Diagnostic tests  x-ray: advanced glenohumeral OA, mild/moderate AC OA    Patient Stated Goals  get arm better and not in pain    Currently in Pain?  Yes    Pain Score  2     Pain Location  Shoulder    Pain Orientation  Right    Pain Descriptors / Indicators  Discomfort    Pain Type  Acute pain    Pain Onset  More than a month ago         [facility] PT Assessment - 03/10/18 0001      Assessment   Medical Diagnosis  impingement of right shoulder    Referring Provider (PT)  Darla Lesches, FNP    Hand Dominance  Right    Next MD Visit  unsure    Prior Therapy  no      Precautions   Precautions  None      Restrictions   Weight Bearing Restrictions  No  [facility] Adult PT Treatment/Exercise - 03/10/18 0001      Exercises   Exercises  Shoulder      Shoulder Exercises: Seated   Extension  Strengthening;Right;20 reps;Theraband    Theraband Level (Shoulder Extension)  Level 1 (Yellow)    External Rotation  Strengthening;Right;20 reps;Theraband    Theraband Level (Shoulder External Rotation)  Level 1 (Yellow)    Internal Rotation  Strengthening;Right;20 reps;Theraband    Theraband Level (Shoulder Internal Rotation)  Level 1 (Yellow)    Flexion  AROM;Right;20 reps    Abduction  AROM;Right;20 reps    Diagonals Limitations  AROM R shoulder scaption x20 reps      Shoulder Exercises: Pulleys   Flexion  5 minutes    Other Pulley Exercises  UE ranger into flexion, circles x5 min      Modalities   Modalities  Electrical Stimulation;Moist Heat      Moist Heat Therapy   Number Minutes Moist Heat  15 Minutes    Moist Heat Location  Shoulder      Electrical Stimulation   Electrical Stimulation Location  R shoulder    Electrical Stimulation Action   Pre-Mod    Electrical Stimulation Parameters  80-150 hz x15 min    Electrical Stimulation Goals  Pain      Manual Therapy   Manual Therapy  Soft tissue mobilization    Soft tissue mobilization  STW to R mid and anterior deltoids to reduce pain and tenderness               PT Short Term Goals - 03/08/18 2050      PT SHORT TERM GOAL #1   Title  STG=LTG        PT Long Term Goals - 03/10/18 1451      PT LONG TERM GOAL #1   Title  Patient will be independent with HEP    Time  6    Period  Weeks    Status  Achieved      PT LONG TERM GOAL #2   Title  Patient will demonstrate 130+ degrees of right shoulder flexion AROM to improve ability to perform functional tasks.    Time  6    Period  Weeks    Status  On-going      PT LONG TERM GOAL #3   Title  Patient will demonstrate 50+ degrees of right shoulder ER AROM to improve ability to don/doff apparel.    Time  6    Period  Weeks    Status  On-going      PT LONG TERM GOAL #4   Title  Patient will report ability to perform ADLs with right shoulder pain less than or equal to 5/10.    Time  6    Period  Weeks    Status  On-going            Plan - 03/10/18 1437    Clinical Impression Statement  Patient presented in clinic with reports R shoulder doing better today and compliant with HEP. Patient able to complete all exercises as directed with minimal discomfort. Weakness noted in ER with light resistance. More discomfort reported by patient with AROM R shoulder scaption. Patient palpably tender in R mid and anterior deltoids. No posterior shoulder tenderness reported by patient. Normal modalities response noted following removal of the modalities. Patient encouraged to continue HEP at home to further progress.      Rehab Potential  Good    PT Frequency  2x / week    PT Duration  6 weeks    PT Treatment/Interventions  ADLs/Self Care Home Management;Cryotherapy;Moist Heat;Iontophoresis 4mg /ml Dexamethasone;Electrical  Stimulation;Ultrasound;Functional mobility training;Neuromuscular re-education;Therapeutic exercise;Therapeutic activities;Patient/family education;Manual techniques;Passive range of motion;Vasopneumatic Device    PT Next Visit Plan  AAROM to right shoulder, gentle strengthening, STW/M, modalities for pain relief.    PT Home Exercise Plan  see patient education section    Consulted and Agree with Plan of Care  Patient       Patient will benefit from skilled therapeutic intervention in order to improve the following deficits and impairments:  Pain, Postural dysfunction, Impaired UE functional use, Decreased strength, Decreased range of motion, Decreased activity tolerance  Visit Diagnosis: Acute pain of right shoulder  Muscle weakness (generalized)     Problem List Patient Active Problem List   Diagnosis Date Noted  . Osteoarthritis of right shoulder 02/24/2018  . Chronic bilateral low back pain 12/09/2017  . Smokeless tobacco use 11/11/2017  . COPD (chronic obstructive pulmonary disease) ([place]) 09/09/2017  . Unilateral primary osteoarthritis, right knee 03/04/2017  . Spondylolisthesis of lumbar region 08/24/2016  . Opioid dependence ([place]) 04/29/2015  . Pain medication agreement signed 04/29/2015  . Chronic back pain 04/29/2015  . Osteoarthritis 04/29/2015  . Metabolic syndrome 17/79/3903  . Morbid obesity ([place]) 10/18/2014  . Neuropathy 10/18/2014  . Hypokalemia 10/18/2014  . Depressed 09/05/2013  . Osteoporosis 06/16/2012  . PUD (peptic ulcer disease) 06/16/2012  . GAD (generalized anxiety disorder) 06/16/2012  . Hyperlipidemia 06/16/2012  . DJD (degenerative joint disease) 06/16/2012  . Hypercalcemia 06/16/2012  . Essential hypertension, benign 06/16/2012  . Gastric ulcer with hemorrhage 01/14/2011  . Diverticulosis of colon 01/13/2011  . Prerenal azotemia 01/13/2011  . Leukocytosis 01/13/2011  . Sacral insufficiency fracture 01/13/2011  . Hypothyroidism 01/13/2011  .  Orthostatic lightheadedness 01/13/2011  . ABNORMAL EKG 07/16/2009    Standley Brooking, PTA 03/10/2018, 2:52 PM  [facility] [address] [place], [place], [address] Phone: [phone]   Fax:  [phone]  Name: Sheila [name] MRN: [id number] Date of Birth: August 26, 1940

## 2018-03-14 ENCOUNTER — Ambulatory Visit: Payer: Medicare Other | Admitting: Family

## 2018-03-15 ENCOUNTER — Ambulatory Visit: Payer: Medicare Other | Admitting: Physical Therapy

## 2018-03-15 ENCOUNTER — Encounter: Payer: Self-pay | Admitting: Physical Therapy

## 2018-03-15 DIAGNOSIS — M6281 Muscle weakness (generalized): Secondary | ICD-10-CM

## 2018-03-15 DIAGNOSIS — M25511 Pain in right shoulder: Secondary | ICD-10-CM | POA: Diagnosis not present

## 2018-03-15 NOTE — Therapy (Signed)
[facility] [place], [place], [address] Phone: [phone]   Fax:  [phone]  Physical Therapy Treatment  Patient Details  Name: Sheila [name] MRN: [id number] Date of Birth: 1940-09-24 Referring Provider (PT): Darla Lesches, FNP   Encounter Date: 03/15/2018  PT End of Session - 03/15/18 1354    Visit Number  3    Number of Visits  12    Date for PT Re-Evaluation  04/26/18    Authorization Type  Progress note every 10th visit    PT Start Time  1345    PT Stop Time  1439    PT Time Calculation (min)  54 min    Activity Tolerance  Patient tolerated treatment well    Behavior During Therapy  [facility] for tasks assessed/performed       Past Medical History:  Diagnosis Date  . Allergy   . Bell's palsy   . Cancer ([place])    skin CA on face  . Cataract   . Chest pain 07/2009   Myoveiw stress test negative.  . Depression   . Diverticulosis of colon   . DJD (degenerative joint disease) of knee   . Dyspnea    on exertion  . Family history of adverse reaction to anesthesia    sister ha nausea/vomiting  . Gastric ulcer with hemorrhage 01/14/2011  . GERD (gastroesophageal reflux disease)   . Headache   . History of fractured pelvis   . Hypercholesteremia   . Hypertension   . Hypothyroidism   . UTI (lower urinary tract infection)     Past Surgical History:  Procedure Laterality Date  . APPENDECTOMY    . BREAST BIOPSY Left   . ESOPHAGOGASTRODUODENOSCOPY  01/14/2011   Procedure: ESOPHAGOGASTRODUODENOSCOPY (EGD);  Surgeon: Rogene Houston, MD;  Location: [facility];  Service: Endoscopy;  Laterality: N/A;  . HIP SURGERY     pelvis  . JOINT REPLACEMENT    . KNEE SURGERY  04/2010.   Total left knee replacement  . LAPAROSCOPIC APPENDECTOMY N/A 08/01/2013   Procedure: APPENDECTOMY LAPAROSCOPIC;  Surgeon: Jamesetta So, MD;  Location: [facility];  Service: General;  Laterality: N/A;    There were no vitals filed for this  visit.  Subjective Assessment - 03/15/18 1354    Subjective  Patient reports shoulder is feeling better but is having soreness to right delotid. "It hurts but not as bad as before"    Pertinent History  COPD    Limitations  Lifting;House hold activities    Diagnostic tests  x-ray: advanced glenohumeral OA, mild/moderate AC OA    Patient Stated Goals  get arm better and not in pain    Currently in Pain?  Yes   did not provide pain scale   Pain Location  Shoulder    Pain Orientation  Right    Pain Descriptors / Indicators  Discomfort;Sore    Pain Type  Acute pain    Pain Onset  More than a month ago    Pain Frequency  Intermittent         [facility] PT Assessment - 03/15/18 0001      Assessment   Medical Diagnosis  impingement of right shoulder    Referring Provider (PT)  Darla Lesches, FNP    Hand Dominance  Right    Next MD Visit  unsure    Prior Therapy  no  [facility] Adult PT Treatment/Exercise - 03/15/18 0001      Exercises   Exercises  Shoulder      Shoulder Exercises: Seated   Horizontal ABduction  Strengthening;Both;10 reps;Theraband    Theraband Level (Shoulder Horizontal ABduction)  Level 1 (Yellow)    Flexion  AROM;Right;20 reps    Abduction  AROM;Right;20 reps      Shoulder Exercises: Pulleys   Flexion  5 minutes    Other Pulley Exercises  UE ranger into flexion, circles 2 mins each x6 min      Modalities   Modalities  Electrical Stimulation;Moist Heat      Moist Heat Therapy   Number Minutes Moist Heat  15 Minutes    Moist Heat Location  Shoulder      Electrical Stimulation   Electrical Stimulation Location  R shoulder    Electrical Stimulation Action  pre-mod    Electrical Stimulation Parameters  80-150 hz x15 mins    Electrical Stimulation Goals  Pain      Manual Therapy   Manual Therapy  Soft tissue mobilization    Soft tissue mobilization  STW to R mid and anterior deltoids to reduce pain and tenderness                PT Short Term Goals - 03/08/18 2050      PT SHORT TERM GOAL #1   Title  STG=LTG        PT Long Term Goals - 03/10/18 1451      PT LONG TERM GOAL #1   Title  Patient will be independent with HEP    Time  6    Period  Weeks    Status  Achieved      PT LONG TERM GOAL #2   Title  Patient will demonstrate 130+ degrees of right shoulder flexion AROM to improve ability to perform functional tasks.    Time  6    Period  Weeks    Status  On-going      PT LONG TERM GOAL #3   Title  Patient will demonstrate 50+ degrees of right shoulder ER AROM to improve ability to don/doff apparel.    Time  6    Period  Weeks    Status  On-going      PT LONG TERM GOAL #4   Title  Patient will report ability to perform ADLs with right shoulder pain less than or equal to 5/10.    Time  6    Period  Weeks    Status  On-going            Plan - 03/15/18 1428    Clinical Impression Statement  Patient was able to tolerate treatment fairly. Patient required rest breaks between exercises due to increase pain in delotid insertion. Patient provided with intermittent verbal and tactile cuing for proper form and technique with exercises and was able to demonstrate with improved form after cuing. Patient noted with moderate sized trigger point at right deltoid insertion. Patient noted with decreaed pain after STW/M. Patient thought today was her last visit; patient informed she has 12 visits in plan of care. Patient reported understanding.     Clinical Presentation  Evolving    Clinical Decision Making  Low    Rehab Potential  Good    PT Frequency  2x / week    PT Duration  6 weeks    PT Treatment/Interventions  ADLs/Self Care Home Management;Cryotherapy;Moist Heat;Iontophoresis 4mg /ml Dexamethasone;Electrical Stimulation;Ultrasound;Functional mobility training;Neuromuscular  re-education;Therapeutic exercise;Therapeutic activities;Patient/family education;Manual techniques;Passive range  of motion;Vasopneumatic Device    PT Next Visit Plan  AAROM to right shoulder, gentle strengthening, STW/M, modalities for pain relief.    PT Home Exercise Plan  see patient education section    Consulted and Agree with Plan of Care  Patient       Patient will benefit from skilled therapeutic intervention in order to improve the following deficits and impairments:  Pain, Postural dysfunction, Impaired UE functional use, Decreased strength, Decreased range of motion, Decreased activity tolerance  Visit Diagnosis: Acute pain of right shoulder  Muscle weakness (generalized)     Problem List Patient Active Problem List   Diagnosis Date Noted  . Osteoarthritis of right shoulder 02/24/2018  . Chronic bilateral low back pain 12/09/2017  . Smokeless tobacco use 11/11/2017  . COPD (chronic obstructive pulmonary disease) ([place]) 09/09/2017  . Unilateral primary osteoarthritis, right knee 03/04/2017  . Spondylolisthesis of lumbar region 08/24/2016  . Opioid dependence ([place]) 04/29/2015  . Pain medication agreement signed 04/29/2015  . Chronic back pain 04/29/2015  . Osteoarthritis 04/29/2015  . Metabolic syndrome 92/92/4462  . Morbid obesity ([place]) 10/18/2014  . Neuropathy 10/18/2014  . Hypokalemia 10/18/2014  . Depressed 09/05/2013  . Osteoporosis 06/16/2012  . PUD (peptic ulcer disease) 06/16/2012  . GAD (generalized anxiety disorder) 06/16/2012  . Hyperlipidemia 06/16/2012  . DJD (degenerative joint disease) 06/16/2012  . Hypercalcemia 06/16/2012  . Essential hypertension, benign 06/16/2012  . Gastric ulcer with hemorrhage 01/14/2011  . Diverticulosis of colon 01/13/2011  . Prerenal azotemia 01/13/2011  . Leukocytosis 01/13/2011  . Sacral insufficiency fracture 01/13/2011  . Hypothyroidism 01/13/2011  . Orthostatic lightheadedness 01/13/2011  . ABNORMAL EKG 07/16/2009    Gabriela Eves, PT, DPT 03/15/2018, 2:41 PM  [facility] [address] [place], [place], [address] Phone: [phone]   Fax:  [phone]  Name: TEMPIE GIBEAULT MRN: [id number] Date of Birth: 1940/03/14

## 2018-03-17 ENCOUNTER — Ambulatory Visit: Payer: Medicare Other | Admitting: Physical Therapy

## 2018-03-17 ENCOUNTER — Encounter: Payer: Self-pay | Admitting: Family

## 2018-03-22 ENCOUNTER — Ambulatory Visit: Payer: Medicare Other | Admitting: Physical Therapy

## 2018-03-22 ENCOUNTER — Encounter: Payer: Self-pay | Admitting: Physical Therapy

## 2018-03-22 DIAGNOSIS — M6281 Muscle weakness (generalized): Secondary | ICD-10-CM | POA: Diagnosis not present

## 2018-03-22 DIAGNOSIS — M25511 Pain in right shoulder: Secondary | ICD-10-CM

## 2018-03-22 MED ORDER — DENOSUMAB 60 MG/ML ~~LOC~~ SOSY: 60.0000 mg | PREFILLED_SYRINGE | Freq: Once | SUBCUTANEOUS | 1 refills | Status: AC

## 2018-03-22 NOTE — Addendum Note (Signed)
Addended by: Marin Olp on: 03/22/2018 11:45 AM   Modules accepted: Orders

## 2018-03-22 NOTE — Therapy (Signed)
[facility] [place], [place], [address] Phone: [phone]   Fax:  [phone]  Physical Therapy Treatment  Patient Details  Name: Sheila [name] MRN: [id number] Date of Birth: 02-Mar-1940 Referring Provider (PT): Darla Lesches, FNP   Encounter Date: 03/22/2018  PT End of Session - 03/22/18 1426    Visit Number  4    Number of Visits  12    Date for PT Re-Evaluation  04/26/18    Authorization Type  Progress note every 10th visit    PT Start Time  0146    PT Stop Time  0240    PT Time Calculation (min)  54 min    Activity Tolerance  Patient tolerated treatment well    Behavior During Therapy  [facility] for tasks assessed/performed       Past Medical History:  Diagnosis Date  . Allergy   . Bell's palsy   . Cancer ([place])    skin CA on face  . Cataract   . Chest pain 07/2009   Myoveiw stress test negative.  . Depression   . Diverticulosis of colon   . DJD (degenerative joint disease) of knee   . Dyspnea    on exertion  . Family history of adverse reaction to anesthesia    sister ha nausea/vomiting  . Gastric ulcer with hemorrhage 01/14/2011  . GERD (gastroesophageal reflux disease)   . Headache   . History of fractured pelvis   . Hypercholesteremia   . Hypertension   . Hypothyroidism   . UTI (lower urinary tract infection)     Past Surgical History:  Procedure Laterality Date  . APPENDECTOMY    . BREAST BIOPSY Left   . ESOPHAGOGASTRODUODENOSCOPY  01/14/2011   Procedure: ESOPHAGOGASTRODUODENOSCOPY (EGD);  Surgeon: Rogene Houston, MD;  Location[facility];  Service: Endoscopy;  Laterality: N/A;  . HIP SURGERY     pelvis  . JOINT REPLACEMENT    . KNEE SURGERY  04/2010.   Total left knee replacement  . LAPAROSCOPIC APPENDECTOMY N/A 08/01/2013   Procedure: APPENDECTOMY LAPAROSCOPIC;  Surgeon: Jamesetta So, MD;  Location: [facility]S;  Service: General;  Laterality: N/A;    There were no vitals filed for this  visit.  Subjective Assessment - 03/22/18 1429    Subjective  I still hurt but I'm able to use my shoulder better.    Diagnostic tests  x-ray: advanced glenohumeral OA, mild/moderate AC OA    Patient Stated Goals  get arm better and not in pain    Currently in Pain?  Yes    Pain Score  3     Pain Location  Shoulder    Pain Orientation  Right    Pain Descriptors / Indicators  Discomfort;Sore    Pain Onset  More than a month ago                       [facility] Adult PT Treatment/Exercise - 03/22/18 0001      Shoulder Exercises: Pulleys   Flexion  5 minutes      Modalities   Modalities  Electrical Stimulation;Moist Heat;Ultrasound      Moist Heat Therapy   Number Minutes Moist Heat  17 Minutes    Moist Heat Location  --   Right shoulder.     Acupuncturist Location  Right shoulder.    Electrical Stimulation Action  IFC    Electrical Stimulation Parameters  80-150  Hz x 66minutes.    Electrical Stimulation Goals  Pain      Ultrasound   Ultrasound Location  --   Right affected shoulder.   Ultrasound Parameters  Combo e'stim/U/S at 1.50 w/CM2 x 10 minutes.      Manual Therapy   Manual Therapy  Soft tissue mobilization    Soft tissue mobilization  STW/M to right posterior cuff and middle deltoid x 8 minutes including trigger point release technique.               PT Short Term Goals - 03/08/18 2050      PT SHORT TERM GOAL #1   Title  STG=LTG        PT Long Term Goals - 03/10/18 1451      PT LONG TERM GOAL #1   Title  Patient will be independent with HEP    Time  6    Period  Weeks    Status  Achieved      PT LONG TERM GOAL #2   Title  Patient will demonstrate 130+ degrees of right shoulder flexion AROM to improve ability to perform functional tasks.    Time  6    Period  Weeks    Status  On-going      PT LONG TERM GOAL #3   Title  Patient will demonstrate 50+ degrees of right shoulder ER AROM to improve  ability to don/doff apparel.    Time  6    Period  Weeks    Status  On-going      PT LONG TERM GOAL #4   Title  Patient will report ability to perform ADLs with right shoulder pain less than or equal to 5/10.    Time  6    Period  Weeks    Status  On-going            Plan - 03/22/18 1434    Clinical Impression Statement  Patient did well with treatment today.  She had a notable trigger point in her right midle deltoid region and tenderness near humeral attachment of her Infraspinatus.  She responded well to STW/M ytoday.    PT Treatment/Interventions  ADLs/Self Care Home Management;Cryotherapy;Moist Heat;Iontophoresis 4mg /ml Dexamethasone;Electrical Stimulation;Ultrasound;Functional mobility training;Neuromuscular re-education;Therapeutic exercise;Therapeutic activities;Patient/family education;Manual techniques;Passive range of motion;Vasopneumatic Device    PT Next Visit Plan  AAROM to right shoulder, gentle strengthening, STW/M, modalities for pain relief.    PT Home Exercise Plan  see patient education section    Consulted and Agree with Plan of Care  Patient       Patient will benefit from skilled therapeutic intervention in order to improve the following deficits and impairments:  Pain, Postural dysfunction, Impaired UE functional use, Decreased strength, Decreased range of motion, Decreased activity tolerance  Visit Diagnosis: Acute pain of right shoulder  Muscle weakness (generalized)     Problem List Patient Active Problem List   Diagnosis Date Noted  . Osteoarthritis of right shoulder 02/24/2018  . Chronic bilateral low back pain 12/09/2017  . Smokeless tobacco use 11/11/2017  . COPD (chronic obstructive pulmonary disease) ([place]) 09/09/2017  . Unilateral primary osteoarthritis, right knee 03/04/2017  . Spondylolisthesis of lumbar region 08/24/2016  . Opioid dependence ([place]) 04/29/2015  . Pain medication agreement signed 04/29/2015  . Chronic back pain  04/29/2015  . Osteoarthritis 04/29/2015  . Metabolic syndrome 09/32/6712  . Morbid obesity ([place]) 10/18/2014  . Neuropathy 10/18/2014  . Hypokalemia 10/18/2014  . Depressed 09/05/2013  . Osteoporosis 06/16/2012  .  PUD (peptic ulcer disease) 06/16/2012  . GAD (generalized anxiety disorder) 06/16/2012  . Hyperlipidemia 06/16/2012  . DJD (degenerative joint disease) 06/16/2012  . Hypercalcemia 06/16/2012  . Essential hypertension, benign 06/16/2012  . Gastric ulcer with hemorrhage 01/14/2011  . Diverticulosis of colon 01/13/2011  . Prerenal azotemia 01/13/2011  . Leukocytosis 01/13/2011  . Sacral insufficiency fracture 01/13/2011  . Hypothyroidism 01/13/2011  . Orthostatic lightheadedness 01/13/2011  . ABNORMAL EKG 07/16/2009    Ruqayyah Lute, [place] MPT 03/22/2018, 2:41 PM  [facility] [address] [place], [place], [address] Phone: [phone]   Fax:  [phone]  Name: MADDOX BRATCHER MRN: [id number] Date of Birth: Dec 17, 1940

## 2018-03-24 ENCOUNTER — Ambulatory Visit (INDEPENDENT_AMBULATORY_CARE_PROVIDER_SITE_OTHER): Payer: Medicare Other | Admitting: *Deleted

## 2018-03-24 ENCOUNTER — Encounter: Payer: Self-pay | Admitting: Physical Therapy

## 2018-03-24 ENCOUNTER — Ambulatory Visit: Payer: Medicare Other | Admitting: Physical Therapy

## 2018-03-24 DIAGNOSIS — M6281 Muscle weakness (generalized): Secondary | ICD-10-CM | POA: Diagnosis not present

## 2018-03-24 DIAGNOSIS — M25511 Pain in right shoulder: Secondary | ICD-10-CM

## 2018-03-24 DIAGNOSIS — M810 Age-related osteoporosis without current pathological fracture: Secondary | ICD-10-CM

## 2018-03-24 MED ORDER — DENOSUMAB 60 MG/ML ~~LOC~~ SOSY
60.0000 mg | PREFILLED_SYRINGE | SUBCUTANEOUS | Status: DC
  Administered 2018-03-24 – 2018-09-23 (×2): 60 mg via SUBCUTANEOUS

## 2018-03-24 NOTE — Progress Notes (Signed)
prolia injection given and tolerated well

## 2018-03-24 NOTE — Therapy (Signed)
[facility] [place], [place], [address] Phone: [phone]   Fax:  [phone]  Physical Therapy Treatment  Patient Details  Name: Sheila [name] MRN: [id number] Date of Birth: 10/06/1940 Referring Provider (PT): Darla Lesches, FNP   Encounter Date: 03/24/2018  PT End of Session - 03/24/18 1435    Visit Number  5    Number of Visits  12    Date for PT Re-Evaluation  04/26/18    Authorization Type  Progress note every 10th visit    PT Start Time  1431    PT Stop Time  1517    PT Time Calculation (min)  46 min    Activity Tolerance  Patient tolerated treatment well    Behavior During Therapy  [facility] for tasks assessed/performed       Past Medical History:  Diagnosis Date  . Allergy   . Bell's palsy   . Cancer ([place])    skin CA on face  . Cataract   . Chest pain 07/2009   Myoveiw stress test negative.  . Depression   . Diverticulosis of colon   . DJD (degenerative joint disease) of knee   . Dyspnea    on exertion  . Family history of adverse reaction to anesthesia    sister ha nausea/vomiting  . Gastric ulcer with hemorrhage 01/14/2011  . GERD (gastroesophageal reflux disease)   . Headache   . History of fractured pelvis   . Hypercholesteremia   . Hypertension   . Hypothyroidism   . UTI (lower urinary tract infection)     Past Surgical History:  Procedure Laterality Date  . APPENDECTOMY    . BREAST BIOPSY Left   . ESOPHAGOGASTRODUODENOSCOPY  01/14/2011   Procedure: ESOPHAGOGASTRODUODENOSCOPY (EGD);  Surgeon: Rogene Houston, MD;  [facility];  Service: Endoscopy;  Laterality: N/A;  . HIP SURGERY     pelvis  . JOINT REPLACEMENT    . KNEE SURGERY  04/2010.   Total left knee replacement  . LAPAROSCOPIC APPENDECTOMY N/A 08/01/2013   Procedure: APPENDECTOMY LAPAROSCOPIC;  Surgeon: Jamesetta So, MD;  Location: [facility];  Service: General;  Laterality: N/A;    There were no vitals filed for this  visit.  Subjective Assessment - 03/24/18 1435    Subjective  Reports some soreness from previous treatment.    Pertinent History  COPD    Limitations  Lifting;House hold activities    Diagnostic tests  x-ray: advanced glenohumeral OA, mild/moderate AC OA    Patient Stated Goals  get arm better and not in pain    Currently in Pain?  Yes    Pain Score  2     Pain Location  Shoulder    Pain Orientation  Right    Pain Descriptors / Indicators  Sore    Pain Type  Acute pain    Pain Onset  More than a month ago    Pain Frequency  Intermittent         [facility] PT Assessment - 03/24/18 0001      Assessment   Medical Diagnosis  impingement of right shoulder    Referring Provider (PT)  Darla Lesches, FNP    Hand Dominance  Right    Next MD Visit  unsure    Prior Therapy  no      Precautions   Precautions  None      Restrictions   Weight Bearing Restrictions  No  [place] Adult PT Treatment/Exercise - 03/24/18 0001      Shoulder Exercises: Pulleys   Flexion  3 minutes      Shoulder Exercises: ROM/Strengthening   Ranger  Seated RUE ranger into flex x3 min, CW circles x2 min, CCW circles x2 min      Modalities   Modalities  Electrical Stimulation;Moist Heat;Ultrasound      Moist Heat Therapy   Number Minutes Moist Heat  15 Minutes    Moist Heat Location  Shoulder      Electrical Stimulation   Electrical Stimulation Location  R shoulder    Electrical Stimulation Action  IFC    Electrical Stimulation Parameters  80-150 hz x15 min    Electrical Stimulation Goals  Pain      Ultrasound   Ultrasound Location  R deltoids, proximal bicep    Ultrasound Parameters  Combo 1.5 w/cm2, 100%, 1 mhz x10 min    Ultrasound Goals  Pain      Manual Therapy   Manual Therapy  Soft tissue mobilization    Soft tissue mobilization  STW to R posterior shoulder, deltoids to reduce muscle tightness                PT Short Term Goals - 03/08/18 2050      PT  SHORT TERM GOAL #1   Title  STG=LTG        PT Long Term Goals - 03/10/18 1451      PT LONG TERM GOAL #1   Title  Patient will be independent with HEP    Time  6    Period  Weeks    Status  Achieved      PT LONG TERM GOAL #2   Title  Patient will demonstrate 130+ degrees of right shoulder flexion AROM to improve ability to perform functional tasks.    Time  6    Period  Weeks    Status  On-going      PT LONG TERM GOAL #3   Title  Patient will demonstrate 50+ degrees of right shoulder ER AROM to improve ability to don/doff apparel.    Time  6    Period  Weeks    Status  On-going      PT LONG TERM GOAL #4   Title  Patient will report ability to perform ADLs with right shoulder pain less than or equal to 5/10.    Time  6    Period  Weeks    Status  On-going            Plan - 03/24/18 1527    Clinical Impression Statement  Patient presented with decreased soreness and discomfort from previous treatment. Patient able to tolerate seated therex well. Notable tremors in RUE were observed during treatment that would increase intermittantly. Patient did not appear overly anxious or in pain. Patient reported less soreness with [place] and STW today. Normal modalities response noted following removal of the modalities.    Rehab Potential  Good    PT Frequency  2x / week    PT Duration  6 weeks    PT Treatment/Interventions  ADLs/Self Care Home Management;Cryotherapy;Moist Heat;Iontophoresis 4mg /ml Dexamethasone;Electrical Stimulation;Ultrasound;Functional mobility training;Neuromuscular re-education;Therapeutic exercise;Therapeutic activities;Patient/family education;Manual techniques;Passive range of motion;Vasopneumatic Device    PT Next Visit Plan  AAROM to right shoulder, gentle strengthening, STW/M, modalities for pain relief.    PT Home Exercise Plan  see patient education section    Consulted and Agree with Plan of Care  Patient       Patient will benefit from skilled  therapeutic intervention in order to improve the following deficits and impairments:  Pain, Postural dysfunction, Impaired UE functional use, Decreased strength, Decreased range of motion, Decreased activity tolerance  Visit Diagnosis: Acute pain of right shoulder  Muscle weakness (generalized)     Problem List Patient Active Problem List   Diagnosis Date Noted  . Osteoarthritis of right shoulder 02/24/2018  . Chronic bilateral low back pain 12/09/2017  . Smokeless tobacco use 11/11/2017  . COPD (chronic obstructive pulmonary disease) ([place]) 09/09/2017  . Unilateral primary osteoarthritis, right knee 03/04/2017  . Spondylolisthesis of lumbar region 08/24/2016  . Opioid dependence ([place]) 04/29/2015  . Pain medication agreement signed 04/29/2015  . Chronic back pain 04/29/2015  . Osteoarthritis 04/29/2015  . Metabolic syndrome 16/24/4695  . Morbid obesity ([place]) 10/18/2014  . Neuropathy 10/18/2014  . Hypokalemia 10/18/2014  . Depressed 09/05/2013  . Osteoporosis 06/16/2012  . PUD (peptic ulcer disease) 06/16/2012  . GAD (generalized anxiety disorder) 06/16/2012  . Hyperlipidemia 06/16/2012  . DJD (degenerative joint disease) 06/16/2012  . Hypercalcemia 06/16/2012  . Essential hypertension, benign 06/16/2012  . Gastric ulcer with hemorrhage 01/14/2011  . Diverticulosis of colon 01/13/2011  . Prerenal azotemia 01/13/2011  . Leukocytosis 01/13/2011  . Sacral insufficiency fracture 01/13/2011  . Hypothyroidism 01/13/2011  . Orthostatic lightheadedness 01/13/2011  . ABNORMAL EKG 07/16/2009    Standley Brooking, PTA 03/24/2018, 3:37 PM  [facility] [address] [place], [place], [address] Phone: [phone]   Fax:  [phone]  Name: LIVY ROSS MRN: [id number] Date of Birth: 06/09/1940

## 2018-03-29 ENCOUNTER — Ambulatory Visit: Payer: Medicare Other | Admitting: Physical Therapy

## 2018-03-29 ENCOUNTER — Encounter (HOSPITAL_COMMUNITY): Payer: Self-pay | Admitting: Emergency Medicine

## 2018-03-29 ENCOUNTER — Emergency Department (HOSPITAL_COMMUNITY)
Admission: EM | Admit: 2018-03-29 | Discharge: 2018-03-29 | Disposition: A | Discharge status: Home / Self Care | Payer: Medicare Other | Attending: Emergency Medicine | Admitting: Emergency Medicine

## 2018-03-29 ENCOUNTER — Other Ambulatory Visit: Payer: Self-pay

## 2018-03-29 DIAGNOSIS — J449 Chronic obstructive pulmonary disease, unspecified: Secondary | ICD-10-CM | POA: Insufficient documentation

## 2018-03-29 DIAGNOSIS — E039 Hypothyroidism, unspecified: Secondary | ICD-10-CM | POA: Insufficient documentation

## 2018-03-29 DIAGNOSIS — J029 Acute pharyngitis, unspecified: Secondary | ICD-10-CM | POA: Insufficient documentation

## 2018-03-29 DIAGNOSIS — Z79899 Other long term (current) drug therapy: Secondary | ICD-10-CM | POA: Insufficient documentation

## 2018-03-29 DIAGNOSIS — I10 Essential (primary) hypertension: Secondary | ICD-10-CM | POA: Insufficient documentation

## 2018-03-29 DIAGNOSIS — F17290 Nicotine dependence, other tobacco product, uncomplicated: Secondary | ICD-10-CM | POA: Diagnosis not present

## 2018-03-29 DIAGNOSIS — Z96652 Presence of left artificial knee joint: Secondary | ICD-10-CM | POA: Diagnosis not present

## 2018-03-29 MED ORDER — GUAIFENESIN-DM 100-10 MG/5ML PO SYRP: 5.0000 mL | ORAL_SOLUTION | ORAL | 0 refills | Status: AC | PRN

## 2018-03-29 NOTE — Discharge Instructions (Signed)
I have prescribed medication to help with your sore throat, please take the syrup as directed.  If you experience any shortness of breath, chest pain, difficulty breathing please return to the ED for reevaluation.

## 2018-03-29 NOTE — ED Triage Notes (Signed)
Patient complaining of sore throat and nausea starting today.

## 2018-03-29 NOTE — ED Provider Notes (Signed)
[facility] EMERGENCY DEPARTMENT Provider Note   CSN: [id number] Arrival date & time: 03/29/18  1254     History   Chief Complaint Chief Complaint  Patient presents with  . Sore Throat    HPI Sheila [name] is a 78 y.o. female.  78 y.o female with a PMH of Bell's Palsy,GERD to the ED with a chief complaint of sore throat since last night.  Patient reports a feeling of scratching in her throat which is worse with swallowing her own saliva.  She reports a feeling of dryness to her throat.  She has tried some Tylenol for her symptoms but reports improvement in symptoms.  She denies any nasal congestion, chest pain, shortness of breath, difficulty swallowing or fever.      Past Medical History:  Diagnosis Date  . Allergy   . Bell's palsy   . Cancer ([place])    skin CA on face  . Cataract   . Chest pain 07/2009   Myoveiw stress test negative.  . Depression   . Diverticulosis of colon   . DJD (degenerative joint disease) of knee   . Dyspnea    on exertion  . Family history of adverse reaction to anesthesia    sister ha nausea/vomiting  . Gastric ulcer with hemorrhage 01/14/2011  . GERD (gastroesophageal reflux disease)   . Headache   . History of fractured pelvis   . Hypercholesteremia   . Hypertension   . Hypothyroidism   . UTI (lower urinary tract infection)     Patient Active Problem List   Diagnosis Date Noted  . Osteoarthritis of right shoulder 02/24/2018  . Chronic bilateral low back pain 12/09/2017  . Smokeless tobacco use 11/11/2017  . COPD (chronic obstructive pulmonary disease) ([place]) 09/09/2017  . Unilateral primary osteoarthritis, right knee 03/04/2017  . Spondylolisthesis of lumbar region 08/24/2016  . Opioid dependence ([place]) 04/29/2015  . Pain medication agreement signed 04/29/2015  . Chronic back pain 04/29/2015  . Osteoarthritis 04/29/2015  . Metabolic syndrome 74/94/4967  . Morbid obesity ([place]) 10/18/2014  . Neuropathy 10/18/2014  . Hypokalemia  10/18/2014  . Depressed 09/05/2013  . Osteoporosis 06/16/2012  . PUD (peptic ulcer disease) 06/16/2012  . GAD (generalized anxiety disorder) 06/16/2012  . Hyperlipidemia 06/16/2012  . DJD (degenerative joint disease) 06/16/2012  . Hypercalcemia 06/16/2012  . Essential hypertension, benign 06/16/2012  . Gastric ulcer with hemorrhage 01/14/2011  . Diverticulosis of colon 01/13/2011  . Prerenal azotemia 01/13/2011  . Leukocytosis 01/13/2011  . Sacral insufficiency fracture 01/13/2011  . Hypothyroidism 01/13/2011  . Orthostatic lightheadedness 01/13/2011  . ABNORMAL EKG 07/16/2009    Past Surgical History:  Procedure Laterality Date  . APPENDECTOMY    . BREAST BIOPSY Left   . ESOPHAGOGASTRODUODENOSCOPY  01/14/2011   Procedure: ESOPHAGOGASTRODUODENOSCOPY (EGD);  Surgeon: Rogene Houston, MD;  Location: [facility] ENDO SUITE;  Service: Endoscopy;  Laterality: N/A;  . HIP SURGERY     pelvis  . JOINT REPLACEMENT    . KNEE SURGERY  04/2010.   Total left knee replacement  . LAPAROSCOPIC APPENDECTOMY N/A 08/01/2013   Procedure: APPENDECTOMY LAPAROSCOPIC;  Surgeon: Jamesetta So, MD;  Location: [facility] ORS;  Service: General;  Laterality: N/A;     OB History   No obstetric history on file.      Home Medications    Prior to Admission medications   Medication Sig Start Date End Date Taking? Authorizing Provider  albuterol (PROVENTIL) (2.5 MG/3ML) 0.083% nebulizer solution NEBULIZE 1 VIAL EVERY 6-8 HOURS  AS NEEDED FOR WHEEZING OR SHORTNESS OF BREATH 01/21/18   [name] A, FNP  ALPRAZolam Duanne Moron) 0.5 MG tablet TAKE (1) TABLET TWICE A DAY. 12/09/17   Sharion Balloon, FNP  budesonide-formoterol (SYMBICORT) 80-4.5 MCG/ACT inhaler INHALE 2 PUFFS 2 TIMES A DAY 03/09/18   [name] A, FNP  citalopram (CELEXA) 20 MG tablet TAKE 1 TABLET DAILY 01/10/18   [name] A, FNP  diclofenac sodium (VOLTAREN) 1 % GEL Apply 2 g topically 4 (four) times daily. 02/24/18   Baruch Gouty, FNP  DULoxetine  (CYMBALTA) 60 MG capsule Take 1 capsule (60 mg total) by mouth daily. 12/09/17   Sharion Balloon, FNP  estradiol (ESTRACE) 0.1 MG/GM vaginal cream Place 1 Applicatorful vaginally every Monday, Wednesday, and Friday. Reported on 08/07/2015    [provider]  furosemide (LASIX) 20 MG tablet TAKE 1 TABLET DAILY 11/10/17   [name] A, FNP  gabapentin (NEURONTIN) 300 MG capsule TAKE (1) CAPSULE THREE TIMES DAILY. 02/07/18   Hawks, Alyse Low A, FNP  guaiFENesin-dextromethorphan (ROBITUSSIN DM) 100-10 MG/5ML syrup Take 5 mLs by mouth every 4 (four) hours as needed for up to 5 days for cough. 03/29/18 04/03/18  Janeece Fitting, PA-C  hydroxypropyl methylcellulose / hypromellose (ISOPTO TEARS / GONIOVISC) 2.5 % ophthalmic solution Place 1 drop into both eyes as needed for dry eyes.    [provider]  levothyroxine (SYNTHROID, LEVOTHROID) 75 MCG tablet TAKE 1 TABLET DAILY 08/18/17   Sharion Balloon, FNP  LIVALO 2 MG TABS TAKE 1 TABLET DAILY 02/07/18   [name] A, FNP  meclizine (ANTIVERT) 25 MG tablet Take 1 tablet 3 times daily as needed for dizziness. 12/09/17   Sharion Balloon, FNP  Multiple Vitamins-Minerals (CENTRUM SILVER ADULT 50+ PO) Take 1 tablet by mouth every other day.    [provider]  pantoprazole (PROTONIX) 20 MG tablet TAKE 1 TABLET DAILY 12/08/17   Setzer, Rona Ravens, NP  potassium chloride SA (K-DUR,KLOR-CON) 20 MEQ tablet TAKE (1) TABLET TWICE A DAY. 11/10/17   Sharion Balloon, FNP  traMADol (ULTRAM) 50 MG tablet Take 1 tablet (50 mg total) by mouth every 12 (twelve) hours as needed. 12/09/17   Sharion Balloon, FNP    Family History Family History  Problem Relation Age of Onset  . Aneurysm Mother   . Stroke Mother   . Hypertension Mother   . Cancer Father        lung  . Heart disease Sister   . Hip fracture Sister   . Cancer Brother   . Alzheimer's disease Sister   . Cancer Sister        skin, uterine  . Cancer Brother     Social  History Social History   Tobacco Use  . Smoking status: Former Smoker    Types: Cigarettes    Last attempt to quit: 02/24/1968    Years since quitting: 50.1  . Smokeless tobacco: Current User    Types: Snuff    Last attempt to quit: 11/23/2017  . Tobacco comment: quit yrs and yrs ago when she was very young  Substance Use Topics  . Alcohol use: No  . Drug use: No     Allergies   Ibuprofen; Benadryl [diphenhydramine hcl]; Lipitor [atorvastatin]; Hydrocodone; Codeine; Morphine and related; and Tdap [tetanus-diphth-acell pertussis]   Review of Systems Review of Systems  Constitutional: Negative for fever.  HENT: Positive for sore throat. Negative for rhinorrhea, sinus pressure and sinus pain.   Respiratory:  Negative for shortness of breath.   Cardiovascular: Negative for chest pain.     Physical Exam Updated Vital Signs BP 140/78 (BP Location: Left Arm)   Temp 98.3 F (36.8 C) (Oral)   Resp 20   Ht 4\' 11"  (1.499 m)   Wt 94.3 kg   BMI 42.01 kg/m   Physical Exam Vitals signs and nursing note reviewed.  Constitutional:      General: She is not in acute distress.    Appearance: She is well-developed.  HENT:     Head: Normocephalic and atraumatic.     Nose: No congestion or rhinorrhea.     Mouth/Throat:     Mouth: Mucous membranes are dry.     Pharynx: Uvula midline. No pharyngeal swelling, oropharyngeal exudate, posterior oropharyngeal erythema or uvula swelling.  Eyes:     Pupils: Pupils are equal, round, and reactive to light.  Neck:     Musculoskeletal: Normal range of motion.  Cardiovascular:     Rate and Rhythm: Regular rhythm.     Heart sounds: Normal heart sounds.  Pulmonary:     Effort: Pulmonary effort is normal. No respiratory distress.     Breath sounds: Normal breath sounds.  Abdominal:     General: Bowel sounds are normal. There is no distension.     Palpations: Abdomen is soft.     Tenderness: There is no abdominal tenderness.  Musculoskeletal:         General: No tenderness or deformity.     Right lower leg: No edema.     Left lower leg: No edema.  Skin:    General: Skin is warm and dry.  Neurological:     Mental Status: She is alert and oriented to person, place, and time.      ED Treatments / Results  Labs (all labs ordered are listed, but only abnormal results are displayed) Labs Reviewed - No data to display  EKG None  Radiology No results found.  Procedures Procedures (including critical care time)  Medications Ordered in ED Medications - No data to display   Initial Impression / Assessment and Plan / ED Course  I have reviewed the triage vital signs and the nursing notes.  Pertinent labs & imaging results that were available during my care of the patient were reviewed by me and considered in my medical decision making (see chart for details).    Is with sore throat, began last night, afebrile during visit.  No wheezing, rales, rhonchi during evaluation.  Vital signs are stable other than heart rate is elevated at 116, patient reports not drinking much on the way, dry mucous membranes.  Limited during visit, will treat with syrup to help with her symptoms.  I suspect tachycardia is due to dehydration.  Patient is requesting to go as she has not eaten today, afebrile low suspicion for any pneumonia as this began last night no nasal drainage.  Follow-up with PCP encouraged in the next 3 days.  Final Clinical Impressions(s) / ED Diagnoses   Final diagnoses:  Sore throat    ED Discharge Orders         Ordered    guaiFENesin-dextromethorphan (ROBITUSSIN DM) 100-10 MG/5ML syrup  Every 4 hours PRN     03/29/18 1448           Janeece Fitting, PA-C 03/29/18 1507    Virgel Manifold, MD 03/30/18 [phone]

## 2018-03-30 ENCOUNTER — Ambulatory Visit: Payer: Medicare Other | Admitting: Family Medicine

## 2018-03-31 ENCOUNTER — Encounter: Payer: Self-pay | Admitting: *Deleted

## 2018-04-05 ENCOUNTER — Encounter: Payer: Self-pay | Admitting: Physical Therapy

## 2018-04-05 ENCOUNTER — Ambulatory Visit: Payer: Medicare Other | Attending: Family Medicine | Admitting: Physical Therapy

## 2018-04-05 ENCOUNTER — Other Ambulatory Visit (INDEPENDENT_AMBULATORY_CARE_PROVIDER_SITE_OTHER): Payer: Self-pay | Admitting: Internal Medicine

## 2018-04-05 ENCOUNTER — Ambulatory Visit (INDEPENDENT_AMBULATORY_CARE_PROVIDER_SITE_OTHER): Payer: Medicare Other | Admitting: Family

## 2018-04-05 ENCOUNTER — Encounter: Payer: Self-pay | Admitting: Family

## 2018-04-05 VITALS — BP 159/91 | HR 108 | Temp 97.3°F | Ht 59.0 in | Wt 208.0 lb

## 2018-04-05 DIAGNOSIS — M545 Low back pain, unspecified: Secondary | ICD-10-CM

## 2018-04-05 DIAGNOSIS — M8949 Other hypertrophic osteoarthropathy, multiple sites: Secondary | ICD-10-CM

## 2018-04-05 DIAGNOSIS — M25511 Pain in right shoulder: Secondary | ICD-10-CM | POA: Insufficient documentation

## 2018-04-05 DIAGNOSIS — F331 Major depressive disorder, recurrent, moderate: Secondary | ICD-10-CM

## 2018-04-05 DIAGNOSIS — G629 Polyneuropathy, unspecified: Secondary | ICD-10-CM | POA: Diagnosis not present

## 2018-04-05 DIAGNOSIS — M6281 Muscle weakness (generalized): Secondary | ICD-10-CM | POA: Diagnosis not present

## 2018-04-05 DIAGNOSIS — G8929 Other chronic pain: Secondary | ICD-10-CM

## 2018-04-05 DIAGNOSIS — Z0289 Encounter for other administrative examinations: Secondary | ICD-10-CM

## 2018-04-05 DIAGNOSIS — E039 Hypothyroidism, unspecified: Secondary | ICD-10-CM | POA: Diagnosis not present

## 2018-04-05 DIAGNOSIS — F411 Generalized anxiety disorder: Secondary | ICD-10-CM

## 2018-04-05 DIAGNOSIS — M150 Primary generalized (osteo)arthritis: Secondary | ICD-10-CM

## 2018-04-05 DIAGNOSIS — I10 Essential (primary) hypertension: Secondary | ICD-10-CM | POA: Diagnosis not present

## 2018-04-05 DIAGNOSIS — M1711 Unilateral primary osteoarthritis, right knee: Secondary | ICD-10-CM

## 2018-04-05 DIAGNOSIS — F1120 Opioid dependence, uncomplicated: Secondary | ICD-10-CM

## 2018-04-05 DIAGNOSIS — R110 Nausea: Secondary | ICD-10-CM

## 2018-04-05 DIAGNOSIS — M159 Polyosteoarthritis, unspecified: Secondary | ICD-10-CM

## 2018-04-05 DIAGNOSIS — J449 Chronic obstructive pulmonary disease, unspecified: Secondary | ICD-10-CM | POA: Diagnosis not present

## 2018-04-05 MED ORDER — ONDANSETRON HCL 4 MG PO TABS: 4.0000 mg | ORAL_TABLET | Freq: Three times a day (TID) | ORAL | 0 refills | Status: DC | PRN

## 2018-04-05 MED ORDER — TRAMADOL HCL 50 MG PO TABS: 100.0000 mg | ORAL_TABLET | Freq: Two times a day (BID) | ORAL | 3 refills | Status: DC | PRN

## 2018-04-05 MED ORDER — ALPRAZOLAM 0.5 MG PO TABS: ORAL_TABLET | ORAL | 2 refills | Status: DC

## 2018-04-05 NOTE — Progress Notes (Signed)
Subjective:    Patient ID: Sheila [name], female    DOB: 1940/09/08, 78 y.o.   MRN: [id number]  HPI Chief Complaint  Patient presents with  . Medical Management of Chronic Issues    due for lab work?   PT presents to the office today for chronic follow up and pain medication refill.  Hypertension  This is a chronic problem. The current episode started more than 1 year ago. The problem has been waxing and waning since onset. The problem is uncontrolled. Associated symptoms include anxiety, malaise/fatigue and shortness of breath. Pertinent negatives include no peripheral edema. Risk factors for coronary artery disease include obesity and sedentary lifestyle. The current treatment provides mild improvement. There is no history of kidney disease, CAD/MI, CVA or heart failure. Identifiable causes of hypertension include a thyroid problem.  Thyroid Problem  Presents for follow-up visit. Symptoms include anxiety, dry skin and fatigue. Patient reports no cold intolerance or depressed mood. The symptoms have been stable. There is no history of heart failure.  Arthritis  Presents for follow-up visit. She complains of pain and stiffness. Affected locations include the right knee and left knee. Her pain is at a severity of 6/10. Associated symptoms include fatigue.  Anxiety  Presents for follow-up visit. Symptoms include decreased concentration, excessive worry, insomnia, irritability, nervous/anxious behavior, restlessness and shortness of breath. Patient reports no depressed mood. Symptoms occur most days. The severity of symptoms is moderate.    Depression         This is a chronic problem.  The current episode started more than 1 year ago.   The onset quality is gradual.   The problem occurs intermittently.  The problem has been waxing and waning since onset.  Associated symptoms include decreased concentration, fatigue, helplessness, hopelessness, insomnia, restlessness, decreased interest and  sad.  Past medical history includes thyroid problem and anxiety.   COPD Using Symbicort BID and using nebulizer BID.  Neuropathy She reports constant aching pain of 5 out 10. Using gabapentin.        Review of Systems Constitutional: Positive for fatigue, irritability and malaise/fatigue.  Respiratory: Positive for shortness of breath.   Endocrine: Negative for cold intolerance.  Musculoskeletal: Positive for arthritis and stiffness.  Psychiatric/Behavioral: Positive for decreased concentration and depression. The patient is nervous/anxious and has insomnia.      Objective:   Physical Exam Vitals signs reviewed.  Constitutional:      General: She is not in acute distress.    Appearance: She is well-developed.  HENT:     Head: Normocephalic and atraumatic.     Right Ear: Tympanic membrane normal.     Left Ear: Tympanic membrane normal.  Eyes:     Pupils: Pupils are equal, round, and reactive to light.  Neck:     Musculoskeletal: Normal range of motion and neck supple.     Thyroid: No thyromegaly.  Cardiovascular:     Rate and Rhythm: Normal rate and regular rhythm.     Heart sounds: Normal heart sounds. No murmur.  Pulmonary:     Effort: Pulmonary effort is normal. No respiratory distress.     Breath sounds: Normal breath sounds. No wheezing.  Abdominal:     General: Bowel sounds are normal. There is no distension.     Palpations: Abdomen is soft.     Tenderness: There is no abdominal tenderness.  Musculoskeletal:        General: Tenderness (pain in right knee with flexion) present.  Right lower leg: Edema present.     Left lower leg: Edema present.     Comments: Generalized weakness, using rolling walker  Skin:    General: Skin is warm and dry.  Neurological:     Mental Status: She is alert and oriented to person, place, and time.     Cranial Nerves: No cranial nerve deficit.     Deep Tendon Reflexes: Reflexes are normal and symmetric.  Psychiatric:         Behavior: Behavior normal.        Thought Content: Thought content normal.        Judgment: Judgment normal.     BP (!) 159/91   Pulse (!) 108   Temp (!) 97.3 F (36.3 C) (Oral)   Ht '4\' 11"'$  (1.499 m)   Wt 208 lb (94.3 kg)   BMI 42.01 kg/m      Assessment & Plan:  Sheila [name] comes in today with chief complaint of Medical Management of Chronic Issues (due for lab work?)   Diagnosis and orders addressed:  1. Essential hypertension, benign - CMP14+EGFR - CBC with Differential/Platelet  2. Chronic obstructive pulmonary disease, unspecified COPD type ([place]) - CMP14+EGFR - CBC with Differential/Platelet  3. Hypothyroidism, unspecified type - CMP14+EGFR - CBC with Differential/Platelet - TSH  4. Neuropathy - CMP14+EGFR - CBC with Differential/Platelet  5. Primary osteoarthritis involving multiple joints - CMP14+EGFR - CBC with Differential/Platelet - traMADol (ULTRAM) 50 MG tablet; Take 2 tablets (100 mg total) by mouth every 12 (twelve) hours as needed.  Dispense: 120 tablet; Refill: 3  6. GAD (generalized anxiety disorder) - CMP14+EGFR - CBC with Differential/Platelet - ALPRAZolam (XANAX) 0.5 MG tablet; TAKE (1) TABLET TWICE A DAY.  Dispense: 60 tablet; Refill: 2  7. Moderate episode of recurrent major depressive disorder (HCC) - CMP14+EGFR - CBC with Differential/Platelet  8. Morbid obesity ([place]) - CMP14+EGFR - CBC with Differential/Platelet  9. Pain medication agreement signed - CMP14+EGFR - CBC with Differential/Platelet  10. Uncomplicated opioid dependence (HCC) - CMP14+EGFR - CBC with Differential/Platelet - traMADol (ULTRAM) 50 MG tablet; Take 2 tablets (100 mg total) by mouth every 12 (twelve) hours as needed.  Dispense: 120 tablet; Refill: 3  11. Chronic bilateral low back pain, unspecified whether sciatica present -we will increase Ultram to 100 mg BID from 50 mg BID Fall preventions discussed - traMADol (ULTRAM) 50 MG tablet; Take 2 tablets  (100 mg total) by mouth every 12 (twelve) hours as needed.  Dispense: 120 tablet; Refill: 3  12. Uncomplicated opioid dependence (HCC) - CMP14+EGFR - CBC with Differential/Platelet - traMADol (ULTRAM) 50 MG tablet; Take 2 tablets (100 mg total) by mouth every 12 (twelve) hours as needed.  Dispense: 120 tablet; Refill: 3  13. Nausea  14. Primary osteoarthritis of right knee - Ambulatory referral to Orthopedic Surgery   Labs pending Pt reviewed in [place] controlled database- No red flags  Health Maintenance reviewed Diet and exercise encouraged  Follow up plan: 3 months    [name], FNP

## 2018-04-05 NOTE — Patient Instructions (Signed)

## 2018-04-05 NOTE — Therapy (Signed)
[facility] [place], [place], [address] Phone: [phone]   Fax:  [phone]  Physical Therapy Treatment  Patient Details  Name: Sheila [name] MRN: [id number] Date of Birth: 03-Jun-1940 Referring Provider (PT): Darla Lesches, FNP   Encounter Date: 04/05/2018  PT End of Session - 04/05/18 1432    Visit Number  6    Number of Visits  12    Date for PT Re-Evaluation  04/26/18    Authorization Type  Progress note every 10th visit    PT Start Time  1430    PT Stop Time  1520    PT Time Calculation (min)  50 min    Activity Tolerance  Patient tolerated treatment well    Behavior During Therapy  [facility] for tasks assessed/performed       Past Medical History:  Diagnosis Date  . Allergy   . Bell's palsy   . Cancer ([place])    skin CA on face  . Cataract   . Chest pain 07/2009   Myoveiw stress test negative.  . Depression   . Diverticulosis of colon   . DJD (degenerative joint disease) of knee   . Dyspnea    on exertion  . Family history of adverse reaction to anesthesia    sister ha nausea/vomiting  . Gastric ulcer with hemorrhage 01/14/2011  . GERD (gastroesophageal reflux disease)   . Headache   . History of fractured pelvis   . Hypercholesteremia   . Hypertension   . Hypothyroidism   . UTI (lower urinary tract infection)     Past Surgical History:  Procedure Laterality Date  . APPENDECTOMY    . BREAST BIOPSY Left   . ESOPHAGOGASTRODUODENOSCOPY  01/14/2011   Procedure: ESOPHAGOGASTRODUODENOSCOPY (EGD);  Surgeon: Rogene Houston, MD;  Location: [facility] ENDO SUITE;  Service: Endoscopy;  Laterality: N/A;  . HIP SURGERY     pelvis  . JOINT REPLACEMENT    . KNEE SURGERY  04/2010.   Total left knee replacement  . LAPAROSCOPIC APPENDECTOMY N/A 08/01/2013   Procedure: APPENDECTOMY LAPAROSCOPIC;  Surgeon: Jamesetta So, MD;  Location: [facility] ORS;  Service: General;  Laterality: N/A;    There were no vitals filed for this  visit.  Subjective Assessment - 04/05/18 1432    Subjective  Reported not feeling good today. " I can barely move my arm"     Pertinent History  COPD    Limitations  Lifting;House hold activities    Diagnostic tests  x-ray: advanced glenohumeral OA, mild/moderate AC OA    Patient Stated Goals  get arm better and not in pain    Currently in Pain?  Yes    Pain Score  6     Pain Location  Shoulder    Pain Orientation  Right    Pain Descriptors / Indicators  Sore    Pain Type  Acute pain    Pain Onset  More than a month ago    Pain Frequency  Intermittent         [facility] PT Assessment - 04/05/18 0001      Assessment   Medical Diagnosis  impingement of right shoulder    Referring Provider (PT)  Darla Lesches, FNP    Hand Dominance  Right    Next MD Visit  unsure    Prior Therapy  no      Precautions   Precautions  None  [facility] Adult PT Treatment/Exercise - 04/05/18 0001      Shoulder Exercises: Pulleys   Flexion  3 minutes      Shoulder Exercises: ROM/Strengthening   Ranger  Seated RUE ranger into flex x3 min, CW circles x2 min, CCW circles x2 min      Modalities   Modalities  Electrical Stimulation;Moist Heat;Ultrasound      Moist Heat Therapy   Number Minutes Moist Heat  15 Minutes    Moist Heat Location  Shoulder      Electrical Stimulation   Electrical Stimulation Location  Right shoulder     Electrical Stimulation Action  IFC    Electrical Stimulation Parameters  80-150 hz x15    Electrical Stimulation Goals  Pain      Ultrasound   Ultrasound Location  R deltoids    Ultrasound Parameters  Combo 1.5 w/cm2 100% 65mhz 1.3 w/cm2    Ultrasound Goals  Pain      Manual Therapy   Manual Therapy  Soft tissue mobilization    Soft tissue mobilization  STW to R posterior shoulder, deltoids to reduce muscle tightness     Passive ROM  --               PT Short Term Goals - 03/08/18 2050      PT SHORT TERM GOAL #1   Title  STG=LTG         PT Long Term Goals - 03/10/18 1451      PT LONG TERM GOAL #1   Title  Patient will be independent with HEP    Time  6    Period  Weeks    Status  Achieved      PT LONG TERM GOAL #2   Title  Patient will demonstrate 130+ degrees of right shoulder flexion AROM to improve ability to perform functional tasks.    Time  6    Period  Weeks    Status  On-going      PT LONG TERM GOAL #3   Title  Patient will demonstrate 50+ degrees of right shoulder ER AROM to improve ability to don/doff apparel.    Time  6    Period  Weeks    Status  On-going      PT LONG TERM GOAL #4   Title  Patient will report ability to perform ADLs with right shoulder pain less than or equal to 5/10.    Time  6    Period  Weeks    Status  On-going            Plan - 04/05/18 1517    Clinical Impression Statement  Patient was able to tolerate treatment well with minimal reports of increased pain. Patient reports combo has made improvements with overall soreness and states she has more function since the start of therapy. No adverse affects noted upon removal of modalities.     Clinical Presentation  Evolving    Clinical Decision Making  Low    Rehab Potential  Good    PT Frequency  2x / week    PT Duration  6 weeks    PT Treatment/Interventions  ADLs/Self Care Home Management;Cryotherapy;Moist Heat;Iontophoresis 4mg /ml Dexamethasone;Electrical Stimulation;Ultrasound;Functional mobility training;Neuromuscular re-education;Therapeutic exercise;Therapeutic activities;Patient/family education;Manual techniques;Passive range of motion;Vasopneumatic Device    PT Next Visit Plan  AAROM to right shoulder, gentle strengthening, STW/M, modalities for pain relief.    Consulted and Agree with Plan of Care  Patient  Patient will benefit from skilled therapeutic intervention in order to improve the following deficits and impairments:  Pain, Postural dysfunction, Impaired UE functional use, Decreased  strength, Decreased range of motion, Decreased activity tolerance  Visit Diagnosis: Acute pain of right shoulder  Muscle weakness (generalized)     Problem List Patient Active Problem List   Diagnosis Date Noted  . Osteoarthritis of right shoulder 02/24/2018  . Chronic bilateral low back pain 12/09/2017  . Smokeless tobacco use 11/11/2017  . COPD (chronic obstructive pulmonary disease) ([place]) 09/09/2017  . Unilateral primary osteoarthritis, right knee 03/04/2017  . Spondylolisthesis of lumbar region 08/24/2016  . Opioid dependence ([place]) 04/29/2015  . Pain medication agreement signed 04/29/2015  . Chronic back pain 04/29/2015  . Osteoarthritis 04/29/2015  . Metabolic syndrome 98/42/1031  . Morbid obesity ([place]) 10/18/2014  . Neuropathy 10/18/2014  . Hypokalemia 10/18/2014  . Depressed 09/05/2013  . Osteoporosis 06/16/2012  . PUD (peptic ulcer disease) 06/16/2012  . GAD (generalized anxiety disorder) 06/16/2012  . Hyperlipidemia 06/16/2012  . DJD (degenerative joint disease) 06/16/2012  . Hypercalcemia 06/16/2012  . Essential hypertension, benign 06/16/2012  . Gastric ulcer with hemorrhage 01/14/2011  . Diverticulosis of colon 01/13/2011  . Prerenal azotemia 01/13/2011  . Leukocytosis 01/13/2011  . Sacral insufficiency fracture 01/13/2011  . Hypothyroidism 01/13/2011  . Orthostatic lightheadedness 01/13/2011  . ABNORMAL EKG 07/16/2009   Gabriela Eves, PT, DPT 04/05/2018, 3:57 PM  [facility] [place], [place], [address] Phone: [phone]   Fax:  [phone]  Name: DESERI LOSS MRN: [id number] Date of Birth: 11/20/1940

## 2018-04-06 ENCOUNTER — Other Ambulatory Visit: Payer: Self-pay | Admitting: Family

## 2018-04-06 LAB — CBC WITH DIFFERENTIAL/PLATELET
Basophils Absolute: 0.1 10*3/uL (ref 0.0–0.2)
Basos: 1 %
EOS (ABSOLUTE): 0.5 10*3/uL — ABNORMAL HIGH (ref 0.0–0.4)
Eos: 5 %
Hematocrit: 37.2 % (ref 34.0–46.6)
Hemoglobin: 12.5 g/dL (ref 11.1–15.9)
Immature Grans (Abs): 0 10*3/uL (ref 0.0–0.1)
Immature Granulocytes: 0 %
Lymphocytes Absolute: 3.6 10*3/uL — ABNORMAL HIGH (ref 0.7–3.1)
Lymphs: 34 %
MCH: 27.5 pg (ref 26.6–33.0)
MCHC: 33.6 g/dL (ref 31.5–35.7)
MCV: 82 fL (ref 79–97)
Monocytes Absolute: 0.8 10*3/uL (ref 0.1–0.9)
Monocytes: 8 %
Neutrophils Absolute: 5.8 10*3/uL (ref 1.4–7.0)
Neutrophils: 52 %
Platelets: 396 10*3/uL (ref 150–450)
RBC: 4.54 x10E6/uL (ref 3.77–5.28)
RDW: 13.8 % (ref 11.7–15.4)
WBC: 10.9 10*3/uL — ABNORMAL HIGH (ref 3.4–10.8)

## 2018-04-06 LAB — CMP14+EGFR
ALT: 20 IU/L (ref 0–32)
AST: 24 IU/L (ref 0–40)
Albumin/Globulin Ratio: 1.4 (ref 1.2–2.2)
Albumin: 4.5 g/dL (ref 3.7–4.7)
Alkaline Phosphatase: 94 IU/L (ref 39–117)
BUN/Creatinine Ratio: 18 (ref 12–28)
BUN: 13 mg/dL (ref 8–27)
Bilirubin Total: 0.4 mg/dL (ref 0.0–1.2)
CO2: 22 mmol/L (ref 20–29)
Calcium: 10.1 mg/dL (ref 8.7–10.3)
Chloride: 105 mmol/L (ref 96–106)
Creatinine, Ser: 0.72 mg/dL (ref 0.57–1.00)
GFR calc Af Amer: 94 mL/min/{1.73_m2} (ref >59)
GFR calc non Af Amer: 82 mL/min/{1.73_m2} (ref >59)
Globulin, Total: 3.2 g/dL (ref 1.5–4.5)
Glucose: 118 mg/dL — ABNORMAL HIGH (ref 65–99)
Potassium: 4.4 mmol/L (ref 3.5–5.2)
Sodium: 142 mmol/L (ref 134–144)
Total Protein: 7.7 g/dL (ref 6.0–8.5)

## 2018-04-06 LAB — TSH: TSH: 5.11 u[IU]/mL — ABNORMAL HIGH (ref 0.450–4.500)

## 2018-04-06 MED ORDER — LEVOTHYROXINE SODIUM 88 MCG PO TABS: 88.0000 ug | ORAL_TABLET | Freq: Every day | ORAL | 11 refills | Status: DC

## 2018-04-07 ENCOUNTER — Ambulatory Visit: Payer: Medicare Other | Admitting: *Deleted

## 2018-04-07 DIAGNOSIS — M6281 Muscle weakness (generalized): Secondary | ICD-10-CM

## 2018-04-07 DIAGNOSIS — M25511 Pain in right shoulder: Secondary | ICD-10-CM | POA: Diagnosis not present

## 2018-04-07 NOTE — Therapy (Signed)
[facility] [place], [place], [address] Phone: [phone]   Fax:  [phone]  Physical Therapy Treatment  Patient Details  Name: [place] [name] MRN: [id number] Date of Birth: 1940-06-02 Referring Provider (PT): Darla Lesches, FNP   Encounter Date: 04/07/2018  PT End of Session - 04/07/18 1440    Visit Number  7    Number of Visits  12    Date for PT Re-Evaluation  04/26/18    Authorization Type  Progress note every 10th visit    PT Start Time  1430    PT Stop Time  1519    PT Time Calculation (min)  49 min       Past Medical History:  Diagnosis Date  . Allergy   . Bell's palsy   . Cancer ([place])    skin CA on face  . Cataract   . Chest pain 07/2009   Myoveiw stress test negative.  . Depression   . Diverticulosis of colon   . DJD (degenerative joint disease) of knee   . Dyspnea    on exertion  . Family history of adverse reaction to anesthesia    sister ha nausea/vomiting  . Gastric ulcer with hemorrhage 01/14/2011  . GERD (gastroesophageal reflux disease)   . Headache   . History of fractured pelvis   . Hypercholesteremia   . Hypertension   . Hypothyroidism   . UTI (lower urinary tract infection)     Past Surgical History:  Procedure Laterality Date  . APPENDECTOMY    . BREAST BIOPSY Left   . ESOPHAGOGASTRODUODENOSCOPY  01/14/2011   Procedure: ESOPHAGOGASTRODUODENOSCOPY (EGD);  Surgeon: Rogene Houston, MD;  Location: [facility];  Service: Endoscopy;  Laterality: N/A;  . HIP SURGERY     pelvis  . JOINT REPLACEMENT    . KNEE SURGERY  04/2010.   Total left knee replacement  . LAPAROSCOPIC APPENDECTOMY N/A 08/01/2013   Procedure: APPENDECTOMY LAPAROSCOPIC;  Surgeon: Jamesetta So, MD;  Location: [facility];  Service: General;  Laterality: N/A;    There were no vitals filed for this visit.  Subjective Assessment - 04/07/18 1439    Subjective  Reported not feeling as bad today.    Pertinent History  COPD     Limitations  Lifting;House hold activities    Diagnostic tests  x-ray: advanced glenohumeral OA, mild/moderate AC OA    Patient Stated Goals  get arm better and not in pain    Currently in Pain?  Yes    Pain Score  4     Pain Location  Shoulder    Pain Orientation  Right    Pain Descriptors / Indicators  Sore    Pain Onset  More than a month ago                       [facility] Adult PT Treatment/Exercise - 04/07/18 0001      Shoulder Exercises: Pulleys   Flexion  5 minutes      Shoulder Exercises: ROM/Strengthening   Ranger  Seated RUE ranger into flex x3 min, CW circles x2 min, CCW circles x2 min      Modalities   Modalities  Electrical Stimulation;Moist Heat;Ultrasound      Moist Heat Therapy   Number Minutes Moist Heat  15 Minutes    Moist Heat Location  Shoulder      Electrical Stimulation   Electrical Stimulation Location  Right shoulder  Electrical Stimulation Action  IFC x 15 mins 80-150hz     Electrical Stimulation Goals  Pain      Ultrasound   Ultrasound Location  RT deltoid    Ultrasound Parameters  1.5 w/cm2 x 10 mins    Ultrasound Goals  Pain      Manual Therapy   Manual Therapy  Soft tissue mobilization    Soft tissue mobilization  STW to R posterior shoulder, deltoids to reduce muscle tightness                PT Short Term Goals - 03/08/18 2050      PT SHORT TERM GOAL #1   Title  STG=LTG        PT Long Term Goals - 03/10/18 1451      PT LONG TERM GOAL #1   Title  Patient will be independent with HEP    Time  6    Period  Weeks    Status  Achieved      PT LONG TERM GOAL #2   Title  Patient will demonstrate 130+ degrees of right shoulder flexion AROM to improve ability to perform functional tasks.    Time  6    Period  Weeks    Status  On-going      PT LONG TERM GOAL #3   Title  Patient will demonstrate 50+ degrees of right shoulder ER AROM to improve ability to don/doff apparel.    Time  6    Period  Weeks     Status  On-going      PT LONG TERM GOAL #4   Title  Patient will report ability to perform ADLs with right shoulder pain less than or equal to 5/10.    Time  6    Period  Weeks    Status  On-going            Plan - 04/07/18 1721    Clinical Impression Statement  Pt arrived today doing a little better with decreased shldr pain and was able to tolerate AAROM exs easier today. She reports ADLs are not as painful now and hurts the most when raising her arm. Normal modality response today    PT Frequency  2x / week    PT Duration  6 weeks    PT Treatment/Interventions  ADLs/Self Care Home Management;Cryotherapy;Moist Heat;Iontophoresis 4mg /ml Dexamethasone;Electrical Stimulation;Ultrasound;Functional mobility training;Neuromuscular re-education;Therapeutic exercise;Therapeutic activities;Patient/family education;Manual techniques;Passive range of motion;Vasopneumatic Device    PT Next Visit Plan  AAROM to right shoulder, gentle strengthening, STW/M, modalities for pain relief.    PT Home Exercise Plan  see patient education section    Consulted and Agree with Plan of Care  Patient       Patient will benefit from skilled therapeutic intervention in order to improve the following deficits and impairments:  Pain, Postural dysfunction, Impaired UE functional use, Decreased strength, Decreased range of motion, Decreased activity tolerance  Visit Diagnosis: Acute pain of right shoulder  Muscle weakness (generalized)     Problem List Patient Active Problem List   Diagnosis Date Noted  . Osteoarthritis of right shoulder 02/24/2018  . Chronic bilateral low back pain 12/09/2017  . Smokeless tobacco use 11/11/2017  . COPD (chronic obstructive pulmonary disease) ([place]) 09/09/2017  . Unilateral primary osteoarthritis, right knee 03/04/2017  . Spondylolisthesis of lumbar region 08/24/2016  . Opioid dependence ([place]) 04/29/2015  . Pain medication agreement signed 04/29/2015  . Chronic back  pain 04/29/2015  . Osteoarthritis 04/29/2015  .  Metabolic syndrome 14/38/8875  . Morbid obesity ([place]) 10/18/2014  . Neuropathy 10/18/2014  . Hypokalemia 10/18/2014  . Depressed 09/05/2013  . Osteoporosis 06/16/2012  . PUD (peptic ulcer disease) 06/16/2012  . GAD (generalized anxiety disorder) 06/16/2012  . Hyperlipidemia 06/16/2012  . DJD (degenerative joint disease) 06/16/2012  . Hypercalcemia 06/16/2012  . Essential hypertension, benign 06/16/2012  . Gastric ulcer with hemorrhage 01/14/2011  . Diverticulosis of colon 01/13/2011  . Prerenal azotemia 01/13/2011  . Leukocytosis 01/13/2011  . Sacral insufficiency fracture 01/13/2011  . Hypothyroidism 01/13/2011  . Orthostatic lightheadedness 01/13/2011  . ABNORMAL EKG 07/16/2009    Rizwan Kuyper,CHRIS, PTA 04/07/2018, 5:23 PM  [facility] [place], [place], [address] Phone: [phone]   Fax:  [phone]  Name: Sheila [name] MRN: [id number] Date of Birth: 1940-10-23

## 2018-04-12 ENCOUNTER — Encounter: Payer: Self-pay | Admitting: Physical Therapy

## 2018-04-12 ENCOUNTER — Ambulatory Visit: Payer: Medicare Other | Admitting: Physical Therapy

## 2018-04-12 DIAGNOSIS — M25511 Pain in right shoulder: Secondary | ICD-10-CM

## 2018-04-12 DIAGNOSIS — M6281 Muscle weakness (generalized): Secondary | ICD-10-CM | POA: Diagnosis not present

## 2018-04-12 NOTE — Therapy (Signed)
[facility] [place], [place], [address] Phone: [phone]   Fax:  [phone]  Physical Therapy Treatment  Patient Details  Name: Sheila [name] MRN: [id number] Date of Birth: Mar 26, 1940 Referring Provider (PT): Darla Lesches, FNP   Encounter Date: 04/12/2018  PT End of Session - 04/12/18 1439    Visit Number  8    Number of Visits  12    Date for PT Re-Evaluation  04/26/18    Authorization Type  Progress note every 10th visit    PT Start Time  1007    PT Stop Time  1519    PT Time Calculation (min)  42 min    Activity Tolerance  Patient tolerated treatment well    Behavior During Therapy  [facility] for tasks assessed/performed       Past Medical History:  Diagnosis Date  . Allergy   . Bell's palsy   . Cancer ([place])    skin CA on face  . Cataract   . Chest pain 07/2009   Myoveiw stress test negative.  . Depression   . Diverticulosis of colon   . DJD (degenerative joint disease) of knee   . Dyspnea    on exertion  . Family history of adverse reaction to anesthesia    sister ha nausea/vomiting  . Gastric ulcer with hemorrhage 01/14/2011  . GERD (gastroesophageal reflux disease)   . Headache   . History of fractured pelvis   . Hypercholesteremia   . Hypertension   . Hypothyroidism   . UTI (lower urinary tract infection)     Past Surgical History:  Procedure Laterality Date  . APPENDECTOMY    . BREAST BIOPSY Left   . ESOPHAGOGASTRODUODENOSCOPY  01/14/2011   Procedure: ESOPHAGOGASTRODUODENOSCOPY (EGD);  Surgeon: Rogene Houston, MD;  [facility];  Service: Endoscopy;  Laterality: N/A;  . HIP SURGERY     pelvis  . JOINT REPLACEMENT    . KNEE SURGERY  04/2010.   Total left knee replacement  . LAPAROSCOPIC APPENDECTOMY N/A 08/01/2013   Procedure: APPENDECTOMY LAPAROSCOPIC;  Surgeon: Jamesetta So, MD;  Location: [facility];  Service: General;  Laterality: N/A;    There were no vitals filed for this  visit.  Subjective Assessment - 04/12/18 1439    Subjective  Reports that her shoulder is better.    Pertinent History  COPD    Limitations  Lifting;House hold activities    Diagnostic tests  x-ray: advanced glenohumeral OA, mild/moderate AC OA    Patient Stated Goals  get arm better and not in pain    Currently in Pain?  Yes    Pain Score  2     Pain Location  Shoulder    Pain Orientation  Right    Pain Descriptors / Indicators  Discomfort    Pain Type  Acute pain    Pain Onset  More than a month ago    Pain Frequency  Intermittent         [facility] PT Assessment - 04/12/18 0001      Assessment   Medical Diagnosis  impingement of right shoulder    Referring Provider (PT)  Darla Lesches, FNP    Hand Dominance  Right    Next MD Visit  unsure    Prior Therapy  no      Precautions   Precautions  None  [facility] Adult PT Treatment/Exercise - 04/12/18 0001      Shoulder Exercises: Seated   External Rotation  AROM;Both;15 reps    Flexion  AROM;Both;20 reps    Other Seated Exercises  B scaption AROM x20 reps      Shoulder Exercises: Pulleys   Flexion  5 minutes      Shoulder Exercises: ROM/Strengthening   Ranger  Seated RUE ranger into flex, CW, CCW circles x3 min      Modalities   Modalities  Electrical Stimulation;Moist Heat;Ultrasound      Moist Heat Therapy   Number Minutes Moist Heat  15 Minutes    Moist Heat Location  Shoulder      Electrical Stimulation   Electrical Stimulation Location  R shoulder    Electrical Stimulation Action  IFC    Electrical Stimulation Parameters  80-150 hz x15 min    Electrical Stimulation Goals  Pain      Ultrasound   Ultrasound Location  R deltoids    Ultrasound Parameters  1.5 w/cm2, 100%, 1 mhz x10 min    Ultrasound Goals  Pain               PT Short Term Goals - 03/08/18 2050      PT SHORT TERM GOAL #1   Title  STG=LTG        PT Long Term Goals - 03/10/18 1451      PT LONG TERM GOAL  #1   Title  Patient will be independent with HEP    Time  6    Period  Weeks    Status  Achieved      PT LONG TERM GOAL #2   Title  Patient will demonstrate 130+ degrees of right shoulder flexion AROM to improve ability to perform functional tasks.    Time  6    Period  Weeks    Status  On-going      PT LONG TERM GOAL #3   Title  Patient will demonstrate 50+ degrees of right shoulder ER AROM to improve ability to don/doff apparel.    Time  6    Period  Weeks    Status  On-going      PT LONG TERM GOAL #4   Title  Patient will report ability to perform ADLs with right shoulder pain less than or equal to 5/10.    Time  6    Period  Weeks    Status  On-going            Plan - 04/12/18 1523    Clinical Impression Statement  Patient arrived in clinic with less R shoulder pain. Patient guided through [facility] and AROM exercises seated with only reports of fatigue with therex. Normal modaities response noted following end of modalities session. Patient still very sensitive to pressure or palpation around deltoids attachment point.     Rehab Potential  Good    PT Frequency  2x / week    PT Duration  6 weeks    PT Treatment/Interventions  ADLs/Self Care Home Management;Cryotherapy;Moist Heat;Iontophoresis 4mg /ml Dexamethasone;Electrical Stimulation;Ultrasound;Functional mobility training;Neuromuscular re-education;Therapeutic exercise;Therapeutic activities;Patient/family education;Manual techniques;Passive range of motion;Vasopneumatic Device    PT Next Visit Plan  AAROM to right shoulder, gentle strengthening, STW/M, modalities for pain relief.    PT Home Exercise Plan  see patient education section    Consulted and Agree with Plan of Care  Patient       Patient will benefit from skilled therapeutic intervention in order  to improve the following deficits and impairments:  Pain, Postural dysfunction, Impaired UE functional use, Decreased strength, Decreased range of motion, Decreased  activity tolerance  Visit Diagnosis: Acute pain of right shoulder  Muscle weakness (generalized)     Problem List Patient Active Problem List   Diagnosis Date Noted  . Osteoarthritis of right shoulder 02/24/2018  . Chronic bilateral low back pain 12/09/2017  . Smokeless tobacco use 11/11/2017  . COPD (chronic obstructive pulmonary disease) ([place]) 09/09/2017  . Unilateral primary osteoarthritis, right knee 03/04/2017  . Spondylolisthesis of lumbar region 08/24/2016  . Opioid dependence ([place]) 04/29/2015  . Pain medication agreement signed 04/29/2015  . Chronic back pain 04/29/2015  . Osteoarthritis 04/29/2015  . Metabolic syndrome 36/64/4034  . Morbid obesity ([place]) 10/18/2014  . Neuropathy 10/18/2014  . Hypokalemia 10/18/2014  . Depressed 09/05/2013  . Osteoporosis 06/16/2012  . PUD (peptic ulcer disease) 06/16/2012  . GAD (generalized anxiety disorder) 06/16/2012  . Hyperlipidemia 06/16/2012  . DJD (degenerative joint disease) 06/16/2012  . Hypercalcemia 06/16/2012  . Essential hypertension, benign 06/16/2012  . Gastric ulcer with hemorrhage 01/14/2011  . Diverticulosis of colon 01/13/2011  . Prerenal azotemia 01/13/2011  . Leukocytosis 01/13/2011  . Sacral insufficiency fracture 01/13/2011  . Hypothyroidism 01/13/2011  . Orthostatic lightheadedness 01/13/2011  . ABNORMAL EKG 07/16/2009    Standley Brooking, PTA 04/12/2018, 3:30 PM  [facility] [place], [place], [address] Phone: [phone]   Fax:  [phone]  Name: ADALIAH HIEGEL MRN: [id number] Date of Birth: December 21, 1940

## 2018-04-14 ENCOUNTER — Ambulatory Visit: Payer: Medicare Other | Admitting: Physical Therapy

## 2018-04-14 ENCOUNTER — Encounter: Payer: Self-pay | Admitting: Physical Therapy

## 2018-04-14 DIAGNOSIS — M25511 Pain in right shoulder: Secondary | ICD-10-CM | POA: Diagnosis not present

## 2018-04-14 DIAGNOSIS — M6281 Muscle weakness (generalized): Secondary | ICD-10-CM | POA: Diagnosis not present

## 2018-04-14 NOTE — Therapy (Signed)
[facility] [place], [place], [address] Phone: [phone]   Fax:  [phone]  Physical Therapy Treatment  Patient Details  Name: [place] [name] MRN: [id number] Date of Birth: October 26, 1940 Referring Provider (PT): Darla Lesches, FNP   Encounter Date: 04/14/2018  PT End of Session - 04/14/18 1434    Visit Number  9    Number of Visits  12    Date for PT Re-Evaluation  04/26/18    Authorization Type  Progress note every 10th visit    PT Start Time  1428    PT Stop Time  1511    PT Time Calculation (min)  43 min    Activity Tolerance  Patient tolerated treatment well    Behavior During Therapy  [facility] for tasks assessed/performed       Past Medical History:  Diagnosis Date  . Allergy   . Bell's palsy   . Cancer ([place])    skin CA on face  . Cataract   . Chest pain 07/2009   Myoveiw stress test negative.  . Depression   . Diverticulosis of colon   . DJD (degenerative joint disease) of knee   . Dyspnea    on exertion  . Family history of adverse reaction to anesthesia    sister ha nausea/vomiting  . Gastric ulcer with hemorrhage 01/14/2011  . GERD (gastroesophageal reflux disease)   . Headache   . History of fractured pelvis   . Hypercholesteremia   . Hypertension   . Hypothyroidism   . UTI (lower urinary tract infection)     Past Surgical History:  Procedure Laterality Date  . APPENDECTOMY    . BREAST BIOPSY Left   . ESOPHAGOGASTRODUODENOSCOPY  01/14/2011   Procedure: ESOPHAGOGASTRODUODENOSCOPY (EGD);  Surgeon: Rogene Houston, MD;  [facility];  Service: Endoscopy;  Laterality: N/A;  . HIP SURGERY     pelvis  . JOINT REPLACEMENT    . KNEE SURGERY  04/2010.   Total left knee replacement  . LAPAROSCOPIC APPENDECTOMY N/A 08/01/2013   Procedure: APPENDECTOMY LAPAROSCOPIC;  Surgeon: Jamesetta So, MD;  Location: [facility];  Service: General;  Laterality: N/A;    There were no vitals filed for this  visit.  Subjective Assessment - 04/14/18 1431    Subjective  Patient arrived with some ongoing soreness, good after last treatment    Pertinent History  COPD    Limitations  Lifting;House hold activities    Diagnostic tests  x-ray: advanced glenohumeral OA, mild/moderate AC OA    Patient Stated Goals  get arm better and not in pain    Currently in Pain?  Yes    Pain Score  2     Pain Location  Shoulder    Pain Orientation  Right    Pain Descriptors / Indicators  Discomfort;Tightness    Pain Type  Acute pain    Pain Onset  More than a month ago    Pain Frequency  Intermittent    Aggravating Factors   overhead movement    Pain Relieving Factors  at rest         [facility] PT Assessment - 04/14/18 0001      PROM   Overall PROM   Deficits    PROM Assessment Site  Shoulder    Right/Left Shoulder  Right    Right Shoulder Flexion  115 Degrees   AAROM  [facility] Adult PT Treatment/Exercise - 04/14/18 0001      Shoulder Exercises: Pulleys   Flexion  5 minutes      Shoulder Exercises: ROM/Strengthening   Ranger  Seated RUE ranger into flex, CW, CCW circles x3 min      Moist Heat Therapy   Number Minutes Moist Heat  15 Minutes    Moist Heat Location  Shoulder      Electrical Stimulation   Electrical Stimulation Location  R shoulder    Electrical Stimulation Action  IFC    Electrical Stimulation Parameters  80-150hz  x73min    Electrical Stimulation Goals  Pain      Ultrasound   Ultrasound Location  right deltoid    Ultrasound Parameters  1.5w/cm2/50%/96mhz x58min    Ultrasound Goals  Pain      Manual Therapy   Manual Therapy  Soft tissue mobilization;Passive ROM    Soft tissue mobilization  STW to R posterior shoulder, deltoids to reduce muscle tightness     Passive ROM  attempted PROM to right shoulder in sitting yet unable due to discomfort               PT Short Term Goals - 03/08/18 2050      PT SHORT TERM GOAL #1   Title  STG=LTG         PT Long Term Goals - 04/14/18 1436      PT LONG TERM GOAL #1   Title  Patient will be independent with HEP    Time  6    Period  Weeks    Status  Achieved      PT LONG TERM GOAL #2   Title  Patient will demonstrate 130+ degrees of right shoulder flexion AROM to improve ability to perform functional tasks.    Time  6    Period  Weeks    Status  On-going   AAROM 115 degrees 04/14/18     PT LONG TERM GOAL #3   Title  Patient will demonstrate 50+ degrees of right shoulder ER AROM to improve ability to don/doff apparel.    Time  6    Period  Weeks    Status  On-going      PT LONG TERM GOAL #4   Title  Patient will report ability to perform ADLs with right shoulder pain less than or equal to 5/10.    Time  6    Period  Weeks    Status  On-going   not performing all ADL's 04/14/18           Plan - 04/14/18 1500    Clinical Impression Statement  Patient tolerated treatment fair due to ongoing discomfort esp with overhead movement. Patient has improved ROM in right shoulder for flexion yet very painful with movement. Today attempted PROM for right shoulder yrt unable due to pain. Performed gentle AAROM exercises followed by [place] and manual STW to reduce pain and tone. goals ongoing at this time. Patient feels 20% improvement overall. Patient will be getting appt for F/U appt.     Rehab Potential  Good    PT Frequency  2x / week    PT Duration  6 weeks    PT Treatment/Interventions  ADLs/Self Care Home Management;Cryotherapy;Moist Heat;Iontophoresis 4mg /ml Dexamethasone;Electrical Stimulation;Ultrasound;Functional mobility training;Neuromuscular re-education;Therapeutic exercise;Therapeutic activities;Patient/family education;Manual techniques;Passive range of motion;Vasopneumatic Device    PT Next Visit Plan  AAROM to right shoulder, gentle strengthening, STW/M, modalities for pain relief.    Consulted and  Agree with Plan of Care  Patient       Patient will benefit from  skilled therapeutic intervention in order to improve the following deficits and impairments:  Pain, Postural dysfunction, Impaired UE functional use, Decreased strength, Decreased range of motion, Decreased activity tolerance  Visit Diagnosis: Acute pain of right shoulder  Muscle weakness (generalized)     Problem List Patient Active Problem List   Diagnosis Date Noted  . Osteoarthritis of right shoulder 02/24/2018  . Chronic bilateral low back pain 12/09/2017  . Smokeless tobacco use 11/11/2017  . COPD (chronic obstructive pulmonary disease) ([place]) 09/09/2017  . Unilateral primary osteoarthritis, right knee 03/04/2017  . Spondylolisthesis of lumbar region 08/24/2016  . Opioid dependence ([place]) 04/29/2015  . Pain medication agreement signed 04/29/2015  . Chronic back pain 04/29/2015  . Osteoarthritis 04/29/2015  . Metabolic syndrome 61/60/7371  . Morbid obesity ([place]) 10/18/2014  . Neuropathy 10/18/2014  . Hypokalemia 10/18/2014  . Depressed 09/05/2013  . Osteoporosis 06/16/2012  . PUD (peptic ulcer disease) 06/16/2012  . GAD (generalized anxiety disorder) 06/16/2012  . Hyperlipidemia 06/16/2012  . DJD (degenerative joint disease) 06/16/2012  . Hypercalcemia 06/16/2012  . Essential hypertension, benign 06/16/2012  . Gastric ulcer with hemorrhage 01/14/2011  . Diverticulosis of colon 01/13/2011  . Prerenal azotemia 01/13/2011  . Leukocytosis 01/13/2011  . Sacral insufficiency fracture 01/13/2011  . Hypothyroidism 01/13/2011  . Orthostatic lightheadedness 01/13/2011  . ABNORMAL EKG 07/16/2009    Phillips Climes, PTA 04/14/2018, 3:11 PM  [facility] [place], [place], [address] Phone: [phone]   Fax:  [phone]  Name: [name] MRN: [id number] Date of Birth: [birth year]

## 2018-04-19 ENCOUNTER — Ambulatory Visit: Payer: Medicare Other | Admitting: *Deleted

## 2018-04-19 DIAGNOSIS — M6281 Muscle weakness (generalized): Secondary | ICD-10-CM | POA: Diagnosis not present

## 2018-04-19 DIAGNOSIS — M25511 Pain in right shoulder: Secondary | ICD-10-CM

## 2018-04-19 NOTE — Therapy (Signed)
[facility] [place], [place], [address] Phone: [phone]   Fax:  [phone]  Physical Therapy Treatment  Progress Note Reporting Period 03/08/2018  to 04/19/2018  See note below for Objective Data and Assessment of Progress/Goals.  Patient reports 40% improvement since start of session. Goals ongoing at this time. Gabriela Eves, PT, DPT    Patient Details  Name: Sheila [name] MRN: [id number] Date of Birth: 12/15/1940 Referring Provider (PT): Darla Lesches, FNP   Encounter Date: 04/19/2018  PT End of Session - 04/19/18 1550    Visit Number  10    Number of Visits  12    Date for PT Re-Evaluation  04/26/18    Authorization Type  Progress note every 10th visit    PT Start Time  1505    PT Stop Time  1551    PT Time Calculation (min)  46 min       Past Medical History:  Diagnosis Date  . Allergy   . Bell's palsy   . Cancer ([place])    skin CA on face  . Cataract   . Chest pain 07/2009   Myoveiw stress test negative.  . Depression   . Diverticulosis of colon   . DJD (degenerative joint disease) of knee   . Dyspnea    on exertion  . Family history of adverse reaction to anesthesia    sister ha nausea/vomiting  . Gastric ulcer with hemorrhage 01/14/2011  . GERD (gastroesophageal reflux disease)   . Headache   . History of fractured pelvis   . Hypercholesteremia   . Hypertension   . Hypothyroidism   . UTI (lower urinary tract infection)     Past Surgical History:  Procedure Laterality Date  . APPENDECTOMY    . BREAST BIOPSY Left   . ESOPHAGOGASTRODUODENOSCOPY  01/14/2011   Procedure: ESOPHAGOGASTRODUODENOSCOPY (EGD);  Surgeon: Rogene Houston, MD;  [facility]: [facility];  Service: Endoscopy;  Laterality: N/A;  . HIP SURGERY     pelvis  . JOINT REPLACEMENT    . KNEE SURGERY  04/2010.   Total left knee replacement  . LAPAROSCOPIC APPENDECTOMY N/A 08/01/2013   Procedure: APPENDECTOMY LAPAROSCOPIC;   Surgeon: Jamesetta So, MD;  Location: [facility];  Service: General;  Laterality: N/A;    There were no vitals filed for this visit.  Subjective Assessment - 04/19/18 1514    Subjective  Patient arrived with some ongoing sorenes RT shldr    Pertinent History  COPD    Limitations  Lifting;House hold activities    Diagnostic tests  x-ray: advanced glenohumeral OA, mild/moderate AC OA    Patient Stated Goals  get arm better and not in pain    Currently in Pain?  Yes    Pain Score  2     Pain Location  Shoulder    Pain Orientation  Right    Pain Descriptors / Indicators  Discomfort    Pain Type  Acute pain    Pain Onset  More than a month ago    Pain Frequency  Intermittent                       [facility] Adult PT Treatment/Exercise - 04/19/18 0001      Shoulder Exercises: Pulleys   Flexion  5 minutes      Shoulder Exercises: ROM/Strengthening   Ranger  Seated RUE ranger into flex, CW, CCW circles x3 min  Modalities   Modalities  Electrical Stimulation;Moist Heat;Ultrasound      Moist Heat Therapy   Number Minutes Moist Heat  15 Minutes    Moist Heat Location  Shoulder      Electrical Stimulation   Electrical Stimulation Location  R shoulder IFC x 15 mins 80-150hz     Electrical Stimulation Goals  Pain      Ultrasound   Ultrasound Location  RT deltoid    Ultrasound Parameters  Combo  1.5 w/cm2 x 10 mins    Ultrasound Goals  Pain      Manual Therapy   Manual Therapy  Soft tissue mobilization;Passive ROM    Soft tissue mobilization  STW to R posterior shoulder, deltoids to reduce muscle tightness                PT Short Term Goals - 03/08/18 2050      PT SHORT TERM GOAL #1   Title  STG=LTG        PT Long Term Goals - 04/14/18 1436      PT LONG TERM GOAL #1   Title  Patient will be independent with HEP    Time  6    Period  Weeks    Status  Achieved      PT LONG TERM GOAL #2   Title  Patient will demonstrate 130+ degrees of right  shoulder flexion AROM to improve ability to perform functional tasks.    Time  6    Period  Weeks    Status  On-going   AAROM 115 degrees 04/14/18     PT LONG TERM GOAL #3   Title  Patient will demonstrate 50+ degrees of right shoulder ER AROM to improve ability to don/doff apparel.    Time  6    Period  Weeks    Status  On-going      PT LONG TERM GOAL #4   Title  Patient will report ability to perform ADLs with right shoulder pain less than or equal to 5/10.    Time  6    Period  Weeks    Status  On-going   not performing all ADL's 04/14/18           Plan - 04/19/18 1755    Clinical Impression Statement  Pt arrived today reporting RT shldr feels 40% better since starting PT. She was able to perform AAROM exs today and did well with modalities. She still has pain with elevation, but less now    Clinical Presentation  Evolving    Clinical Decision Making  Low       Patient will benefit from skilled therapeutic intervention in order to improve the following deficits and impairments:     Visit Diagnosis: Acute pain of right shoulder  Muscle weakness (generalized)     Problem List Patient Active Problem List   Diagnosis Date Noted  . Osteoarthritis of right shoulder 02/24/2018  . Chronic bilateral low back pain 12/09/2017  . Smokeless tobacco use 11/11/2017  . COPD (chronic obstructive pulmonary disease) ([place]) 09/09/2017  . Unilateral primary osteoarthritis, right knee 03/04/2017  . Spondylolisthesis of lumbar region 08/24/2016  . Opioid dependence ([place]) 04/29/2015  . Pain medication agreement signed 04/29/2015  . Chronic back pain 04/29/2015  . Osteoarthritis 04/29/2015  . Metabolic syndrome 40/98/1191  . Morbid obesity ([place]) 10/18/2014  . Neuropathy 10/18/2014  . Hypokalemia 10/18/2014  . Depressed 09/05/2013  . Osteoporosis 06/16/2012  . PUD (peptic ulcer disease) 06/16/2012  .  GAD (generalized anxiety disorder) 06/16/2012  . Hyperlipidemia 06/16/2012  .  DJD (degenerative joint disease) 06/16/2012  . Hypercalcemia 06/16/2012  . Essential hypertension, benign 06/16/2012  . Gastric ulcer with hemorrhage 01/14/2011  . Diverticulosis of colon 01/13/2011  . Prerenal azotemia 01/13/2011  . Leukocytosis 01/13/2011  . Sacral insufficiency fracture 01/13/2011  . Hypothyroidism 01/13/2011  . Orthostatic lightheadedness 01/13/2011  . ABNORMAL EKG 07/16/2009    RAMSEUR,CHRIS, PTA 04/19/2018, 5:59 PM  [facility] [place], [place], [address] Phone: [phone]   Fax:  [phone]  Name: Sheila [name] MRN: [id number] Date of Birth: [birth year]

## 2018-04-21 ENCOUNTER — Encounter: Payer: Self-pay | Admitting: Physical Therapy

## 2018-04-26 ENCOUNTER — Ambulatory Visit: Payer: Medicare Other | Admitting: Physical Therapy

## 2018-04-28 ENCOUNTER — Ambulatory Visit: Payer: Medicare Other | Attending: Family Medicine | Admitting: Physical Therapy

## 2018-04-28 DIAGNOSIS — M6281 Muscle weakness (generalized): Secondary | ICD-10-CM | POA: Diagnosis not present

## 2018-04-28 DIAGNOSIS — M25511 Pain in right shoulder: Secondary | ICD-10-CM | POA: Diagnosis not present

## 2018-04-28 NOTE — Addendum Note (Signed)
Addended byGabriela Eves on: 04/28/2018 06:04 PM   Modules accepted: Orders

## 2018-04-28 NOTE — Therapy (Addendum)
[facility] [place], [place], [address] Phone: [phone]   Fax:  [phone]  Physical Therapy Treatment  Patient Details  Name: Sheila [name] MRN: [id number] Date of Birth: 21-Sep-1940 Referring Provider (PT): Darla Lesches, FNP   Encounter Date: 04/28/2018  PT End of Session - 04/28/18 1503    Visit Number  11    Number of Visits  12    Date for PT Re-Evaluation  05/06/18    Authorization Type  Progress note every 10th visit    PT Start Time  1431    PT Stop Time  1515    PT Time Calculation (min)  44 min    Activity Tolerance  Patient tolerated treatment well    Behavior During Therapy  [facility] for tasks assessed/performed       Past Medical History:  Diagnosis Date  . Allergy   . Bell's palsy   . Cancer ([place])    skin CA on face  . Cataract   . Chest pain 07/2009   Myoveiw stress test negative.  . Depression   . Diverticulosis of colon   . DJD (degenerative joint disease) of knee   . Dyspnea    on exertion  . Family history of adverse reaction to anesthesia    sister ha nausea/vomiting  . Gastric ulcer with hemorrhage 01/14/2011  . GERD (gastroesophageal reflux disease)   . Headache   . History of fractured pelvis   . Hypercholesteremia   . Hypertension   . Hypothyroidism   . UTI (lower urinary tract infection)     Past Surgical History:  Procedure Laterality Date  . APPENDECTOMY    . BREAST BIOPSY Left   . ESOPHAGOGASTRODUODENOSCOPY  01/14/2011   Procedure: ESOPHAGOGASTRODUODENOSCOPY (EGD);  Surgeon: Rogene Houston, MD;  [facility];  Service: Endoscopy;  Laterality: N/A;  . HIP SURGERY     pelvis  . JOINT REPLACEMENT    . KNEE SURGERY  04/2010.   Total left knee replacement  . LAPAROSCOPIC APPENDECTOMY N/A 08/01/2013   Procedure: APPENDECTOMY LAPAROSCOPIC;  Surgeon: Jamesetta So, MD;  Location: [facility];  Service: General;  Laterality: N/A;    There were no vitals filed for this  visit.  Subjective Assessment - 04/28/18 1435    Subjective  Patient arrived with less discomfort reported    Pertinent History  COPD    Limitations  Lifting;House hold activities    Diagnostic tests  x-ray: advanced glenohumeral OA, mild/moderate AC OA    Patient Stated Goals  get arm better and not in pain    Currently in Pain?  Yes    Pain Score  2     Pain Location  Shoulder    Pain Orientation  Right    Pain Descriptors / Indicators  Discomfort    Pain Type  Acute pain    Pain Onset  More than a month ago    Pain Frequency  Intermittent    Aggravating Factors   overhead movement    Pain Relieving Factors  at rest                       [facility] Adult PT Treatment/Exercise - 04/28/18 0001      Shoulder Exercises: Pulleys   Flexion  5 minutes      Shoulder Exercises: ROM/Strengthening   Ranger  Seated RUE ranger into flex, CW, CCW circles x3 min      Moist Heat  Therapy   Number Minutes Moist Heat  15 Minutes    Moist Heat Location  Shoulder      Electrical Stimulation   Electrical Stimulation Location  R shoulder IFC x 15 mins 80-_0     Electrical Stimulation Goals  Pain      Ultrasound   Ultrasound Location  rt deltoid    Ultrasound Parameters  combo 32mz 50% 124mx1071m   Ultrasound Goals  Pain      Manual Therapy   Manual Therapy  Soft tissue mobilization    Soft tissue mobilization  STW to R posterior shoulder, deltoids to reduce muscle tightness                PT Short Term Goals - 03/08/18 2050      PT SHORT TERM GOAL #1   Title  STG=LTG        PT Long Term Goals - 04/28/18 1440      PT LONG TERM GOAL #1   Title  Patient will be independent with HEP    Time  6    Period  Weeks    Status  Achieved      PT LONG TERM GOAL #2   Title  Patient will demonstrate 130+ degrees of right shoulder flexion AROM to improve ability to perform functional tasks.    Time  6    Period  Weeks    Status  Achieved   AROM 140 degrees 04/28/18      PT LONG TERM GOAL #3   Title  Patient will demonstrate 50+ degrees of right shoulder ER AROM to improve ability to don/doff apparel.    Time  6    Period  Weeks    Status  Achieved   AROM 65 degrees 04/28/18     PT LONG TERM GOAL #4   Title  Patient will report ability to perform ADLs with right shoulder pain less than or equal to 5/10.    Time  6    Period  Weeks    Status  Achieved   Able to perform her normal ADL's with 2/10 pain 04/28/18           Plan - 04/28/18 1505    Clinical Impression Statement  Patient tolerated treatment well today. Patient has met all current goals today. patient is able to move her right shoulder WNL and perform her ADL's with 2/10 pain at most per reported.     Rehab Potential  Good    PT Frequency  2x / week    PT Duration  6 weeks    PT Treatment/Interventions  ADLs/Self Care Home Management;Cryotherapy;Moist Heat;Iontophoresis 4mg58m Dexamethasone;Electrical Stimulation;Ultrasound;Functional mobility training;Neuromuscular re-education;Therapeutic exercise;Therapeutic activities;Patient/family education;Manual techniques;Passive range of motion;Vasopneumatic Device    PT Next Visit Plan  Review and DC next treatment    Consulted and Agree with Plan of Care  Patient       Patient will benefit from skilled therapeutic intervention in order to improve the following deficits and impairments:  Pain, Postural dysfunction, Impaired UE functional use, Decreased strength, Decreased range of motion, Decreased activity tolerance  Visit Diagnosis: Acute pain of right shoulder  Muscle weakness (generalized)     Problem List Patient Active Problem List   Diagnosis Date Noted  . Osteoarthritis of right shoulder 02/24/2018  . Chronic bilateral low back pain 12/09/2017  . Smokeless tobacco use 11/11/2017  . COPD (chronic obstructive pulmonary disease) ([facility])[place]/18/2019  . Unilateral primary osteoarthritis, right knee 03/04/2017  .  Spondylolisthesis  of lumbar region 08/24/2016  . Opioid dependence ([place]) 04/29/2015  . Pain medication agreement signed 04/29/2015  . Chronic back pain 04/29/2015  . Osteoarthritis 04/29/2015  . Metabolic syndrome 96/72/8979  . Morbid obesity ([place]) 10/18/2014  . Neuropathy 10/18/2014  . Hypokalemia 10/18/2014  . Depressed 09/05/2013  . Osteoporosis 06/16/2012  . PUD (peptic ulcer disease) 06/16/2012  . GAD (generalized anxiety disorder) 06/16/2012  . Hyperlipidemia 06/16/2012  . DJD (degenerative joint disease) 06/16/2012  . Hypercalcemia 06/16/2012  . Essential hypertension, benign 06/16/2012  . Gastric ulcer with hemorrhage 01/14/2011  . Diverticulosis of colon 01/13/2011  . Prerenal azotemia 01/13/2011  . Leukocytosis 01/13/2011  . Sacral insufficiency fracture 01/13/2011  . Hypothyroidism 01/13/2011  . Orthostatic lightheadedness 01/13/2011  . ABNORMAL EKG 07/16/2009   Ladean Raya, PTA 04/28/18 6:02 PM  [facility] [place], [place], [address] Phone: [phone]   Fax:  [phone]  Name: Sheila [name] MRN: [id number] Date of Birth: 1941/02/06

## 2018-05-03 ENCOUNTER — Ambulatory Visit: Payer: Medicare Other | Admitting: *Deleted

## 2018-05-03 ENCOUNTER — Encounter: Payer: Self-pay | Admitting: Family

## 2018-05-03 ENCOUNTER — Ambulatory Visit (INDEPENDENT_AMBULATORY_CARE_PROVIDER_SITE_OTHER): Payer: Medicare Other | Admitting: Family

## 2018-05-03 ENCOUNTER — Other Ambulatory Visit: Payer: Self-pay | Admitting: Family

## 2018-05-03 VITALS — BP 133/84 | HR 105 | Temp 97.9°F | Ht 59.0 in | Wt 212.8 lb

## 2018-05-03 DIAGNOSIS — F331 Major depressive disorder, recurrent, moderate: Secondary | ICD-10-CM

## 2018-05-03 DIAGNOSIS — M8949 Other hypertrophic osteoarthropathy, multiple sites: Secondary | ICD-10-CM

## 2018-05-03 DIAGNOSIS — M545 Low back pain, unspecified: Secondary | ICD-10-CM

## 2018-05-03 DIAGNOSIS — F411 Generalized anxiety disorder: Secondary | ICD-10-CM | POA: Diagnosis not present

## 2018-05-03 DIAGNOSIS — M159 Polyosteoarthritis, unspecified: Secondary | ICD-10-CM

## 2018-05-03 DIAGNOSIS — E039 Hypothyroidism, unspecified: Secondary | ICD-10-CM

## 2018-05-03 DIAGNOSIS — Z0289 Encounter for other administrative examinations: Secondary | ICD-10-CM

## 2018-05-03 DIAGNOSIS — K279 Peptic ulcer, site unspecified, unspecified as acute or chronic, without hemorrhage or perforation: Secondary | ICD-10-CM

## 2018-05-03 DIAGNOSIS — M150 Primary generalized (osteo)arthritis: Secondary | ICD-10-CM

## 2018-05-03 DIAGNOSIS — G8929 Other chronic pain: Secondary | ICD-10-CM | POA: Diagnosis not present

## 2018-05-03 DIAGNOSIS — M6281 Muscle weakness (generalized): Secondary | ICD-10-CM

## 2018-05-03 DIAGNOSIS — F1120 Opioid dependence, uncomplicated: Secondary | ICD-10-CM | POA: Diagnosis not present

## 2018-05-03 DIAGNOSIS — M25511 Pain in right shoulder: Secondary | ICD-10-CM

## 2018-05-03 MED ORDER — PITAVASTATIN CALCIUM 2 MG PO TABS: 1.0000 | ORAL_TABLET | Freq: Every day | ORAL | 1 refills | Status: DC

## 2018-05-03 MED ORDER — GABAPENTIN 300 MG PO CAPS: ORAL_CAPSULE | ORAL | 0 refills | Status: DC

## 2018-05-03 MED ORDER — FUROSEMIDE 20 MG PO TABS: 20.0000 mg | ORAL_TABLET | Freq: Every day | ORAL | 1 refills | Status: DC

## 2018-05-03 MED ORDER — DULOXETINE HCL 60 MG PO CPEP: 60.0000 mg | ORAL_CAPSULE | Freq: Every day | ORAL | 1 refills | Status: DC

## 2018-05-03 MED ORDER — POTASSIUM CHLORIDE CRYS ER 20 MEQ PO TBCR: EXTENDED_RELEASE_TABLET | ORAL | 1 refills | Status: DC

## 2018-05-03 NOTE — Therapy (Addendum)
[facility] [place], [place], [address] Phone: [phone]   Fax:  [phone]  Physical Therapy Treatment  PHYSICAL THERAPY DISCHARGE SUMMARY  Visits from Start of Care: 12  Current functional level related to goals / functional outcomes: See below   Remaining deficits: All goals met   Education / Equipment: HEP Plan: Patient agrees to discharge.  Patient goals were met. Patient is being discharged due to meeting the stated rehab goals.  ?????    Gabriela Eves, PT, DPT 05/03/18    Patient Details  Name: Sheila [name] MRN: [id number] Date of Birth: 1940/04/19 Referring Provider (PT): Darla Lesches, FNP   Encounter Date: 05/03/2018  PT End of Session - 05/03/18 1507    Visit Number  12    Number of Visits  12    Date for PT Re-Evaluation  05/06/18    Authorization Type  Progress note every 10th visit    PT Start Time  1430    PT Stop Time  1520    PT Time Calculation (min)  50 min       Past Medical History:  Diagnosis Date  . Allergy   . Bell's palsy   . Cancer ([place])    skin CA on face  . Cataract   . Chest pain 07/2009   Myoveiw stress test negative.  . Depression   . Diverticulosis of colon   . DJD (degenerative joint disease) of knee   . Dyspnea    on exertion  . Family history of adverse reaction to anesthesia    sister ha nausea/vomiting  . Gastric ulcer with hemorrhage 01/14/2011  . GERD (gastroesophageal reflux disease)   . Headache   . History of fractured pelvis   . Hypercholesteremia   . Hypertension   . Hypothyroidism   . UTI (lower urinary tract infection)     Past Surgical History:  Procedure Laterality Date  . APPENDECTOMY    . BREAST BIOPSY Left   . ESOPHAGOGASTRODUODENOSCOPY  01/14/2011   Procedure: ESOPHAGOGASTRODUODENOSCOPY (EGD);  Surgeon: Rogene Houston, MD;  [facility];  Service: Endoscopy;  Laterality: N/A;  . HIP SURGERY     pelvis  . JOINT  REPLACEMENT    . KNEE SURGERY  04/2010.   Total left knee replacement  . LAPAROSCOPIC APPENDECTOMY N/A 08/01/2013   Procedure: APPENDECTOMY LAPAROSCOPIC;  Surgeon: Jamesetta So, MD;  Location: [facility];  Service: General;  Laterality: N/A;    There were no vitals filed for this visit.                    [facility] Adult PT Treatment/Exercise - 05/03/18 0001      Modalities   Modalities  Electrical Stimulation;Moist Heat;Ultrasound      Moist Heat Therapy   Number Minutes Moist Heat  15 Minutes    Moist Heat Location  Shoulder      Electrical Stimulation   Electrical Stimulation Location  R shoulder IFC x 15 mins 80-'150hz'$     Electrical Stimulation Goals  Pain      Ultrasound   Ultrasound Location  RT deltoid    Ultrasound Parameters  Combo x 12 mins  1.5 w/ cm2     Ultrasound Goals  Pain      Manual Therapy   Manual Therapy  Soft tissue mobilization    Soft tissue mobilization  STW to R posterior shoulder, deltoids to reduce muscle tightness  PT Short Term Goals - 03/08/18 2050      PT SHORT TERM GOAL #1   Title  STG=LTG        PT Long Term Goals - 04/28/18 1440      PT LONG TERM GOAL #1   Title  Patient will be independent with HEP    Time  6    Period  Weeks    Status  Achieved      PT LONG TERM GOAL #2   Title  Patient will demonstrate 130+ degrees of right shoulder flexion AROM to improve ability to perform functional tasks.    Time  6    Period  Weeks    Status  Achieved   AROM 140 degrees 04/28/18     PT LONG TERM GOAL #3   Title  Patient will demonstrate 50+ degrees of right shoulder ER AROM to improve ability to don/doff apparel.    Time  6    Period  Weeks    Status  Achieved   AROM 65 degrees 04/28/18     PT LONG TERM GOAL #4   Title  Patient will report ability to perform ADLs with right shoulder pain less than or equal to 5/10.    Time  6    Period  Weeks    Status  Achieved   Able to perform her normal ADL's  with 2/10 pain 04/28/18           Plan - 05/03/18 1436    Clinical Impression Statement  Pt arrived today doing fairly well and reports mainly soreness in RT shldr. She did well with Rx and has met all LTGs. Normal modality response. DC today.    Rehab Potential  Good    PT Frequency  2x / week    PT Duration  6 weeks    PT Treatment/Interventions  ADLs/Self Care Home Management;Cryotherapy;Moist Heat;Iontophoresis '4mg'$ /ml Dexamethasone;Electrical Stimulation;Ultrasound;Functional mobility training;Neuromuscular re-education;Therapeutic exercise;Therapeutic activities;Patient/family education;Manual techniques;Passive range of motion;Vasopneumatic Device    PT Next Visit Plan  Review and DC next treatment    PT Home Exercise Plan  see patient education section    Consulted and Agree with Plan of Care  Patient       Patient will benefit from skilled therapeutic intervention in order to improve the following deficits and impairments:  Pain, Postural dysfunction, Impaired UE functional use, Decreased strength, Decreased range of motion, Decreased activity tolerance  Visit Diagnosis: Acute pain of right shoulder  Muscle weakness (generalized)     Problem List Patient Active Problem List   Diagnosis Date Noted  . Osteoarthritis of right shoulder 02/24/2018  . Chronic bilateral low back pain 12/09/2017  . Smokeless tobacco use 11/11/2017  . COPD (chronic obstructive pulmonary disease) ([place]) 09/09/2017  . Unilateral primary osteoarthritis, right knee 03/04/2017  . Spondylolisthesis of lumbar region 08/24/2016  . Opioid dependence ([place]) 04/29/2015  . Pain medication agreement signed 04/29/2015  . Chronic back pain 04/29/2015  . Osteoarthritis 04/29/2015  . Metabolic syndrome 99/24/2683  . Morbid obesity ([place]) 10/18/2014  . Neuropathy 10/18/2014  . Hypokalemia 10/18/2014  . Depressed 09/05/2013  . Osteoporosis 06/16/2012  . PUD (peptic ulcer disease) 06/16/2012  . GAD  (generalized anxiety disorder) 06/16/2012  . Hyperlipidemia 06/16/2012  . DJD (degenerative joint disease) 06/16/2012  . Hypercalcemia 06/16/2012  . Essential hypertension, benign 06/16/2012  . Gastric ulcer with hemorrhage 01/14/2011  . Diverticulosis of colon 01/13/2011  . Prerenal azotemia 01/13/2011  . Leukocytosis 01/13/2011  . Sacral  insufficiency fracture 01/13/2011  . Hypothyroidism 01/13/2011  . Orthostatic lightheadedness 01/13/2011  . ABNORMAL EKG 07/16/2009    Nashid Pellum,CHRIS, PTA 05/03/2018, 5:36 PM  [facility] [place], [place], [address] Phone: [phone]   Fax:  [phone]  Name: JEWELDEAN DROHAN MRN: [id number] Date of Birth: [birth year]

## 2018-05-03 NOTE — Patient Instructions (Signed)

## 2018-05-03 NOTE — Progress Notes (Signed)
Subjective:    Patient ID: Sheila [name], female    DOB: [birth year], 78 y.o.   MRN: [id number]   Chief Complaint  Patient presents with  . Depression    Depression         This is a chronic problem.  The current episode started more than 1 year ago.   The onset quality is gradual.   The problem occurs intermittently.  The problem has been waxing and waning since onset.  Associated symptoms include fatigue, helplessness, hopelessness, insomnia, irritable, restlessness, decreased interest and sad.  Past treatments include SNRIs - Serotonin and norepinephrine reuptake inhibitors.  Past medical history includes thyroid problem and anxiety.   Anxiety  Presents for follow-up visit. Symptoms include depressed mood, excessive worry, insomnia, irritability, nausea, nervous/anxious behavior and restlessness. Symptoms occur occasionally. The severity of symptoms is moderate.    Back Pain  This is a chronic problem. The current episode started more than 1 year ago. The problem occurs intermittently. The problem has been waxing and waning since onset. The pain is present in the lumbar spine. The pain is moderate.  Thyroid Problem  Presents for follow-up visit. Symptoms include anxiety, depressed mood, fatigue and hoarse voice. The symptoms have been worsening.  Gastroesophageal Reflux  She complains of a hoarse voice and nausea. She reports no belching. This is a chronic problem. The current episode started more than 1 year ago. The problem occurs occasionally. Associated symptoms include fatigue.      Review of Systems  Constitutional: Positive for fatigue and irritability.  HENT: Positive for hoarse voice.   Gastrointestinal: Positive for nausea.  Musculoskeletal: Positive for arthralgias and back pain.  Psychiatric/Behavioral: Positive for depression. The patient is nervous/anxious and has insomnia.   All other systems reviewed and are negative.      Objective:   Physical Exam Vitals  signs reviewed.  Constitutional:      General: She is irritable. She is not in acute distress.    Appearance: She is well-developed.  HENT:     Head: Normocephalic and atraumatic.     Right Ear: Tympanic membrane normal.     Left Ear: Tympanic membrane normal.  Eyes:     Pupils: Pupils are equal, round, and reactive to light.  Neck:     Musculoskeletal: Normal range of motion and neck supple.     Thyroid: No thyromegaly.  Cardiovascular:     Rate and Rhythm: Normal rate and regular rhythm.     Heart sounds: Normal heart sounds. No murmur.  Pulmonary:     Effort: Pulmonary effort is normal. No respiratory distress.     Breath sounds: Normal breath sounds. No wheezing.  Abdominal:     General: Bowel sounds are normal. There is no distension.     Palpations: Abdomen is soft.     Tenderness: There is no abdominal tenderness.  Musculoskeletal:        General: No tenderness.     Comments: Generalized weakness, using rolling walker  Skin:    General: Skin is warm and dry.  Neurological:     Mental Status: She is alert and oriented to person, place, and time.     Cranial Nerves: No cranial nerve deficit.     Deep Tendon Reflexes: Reflexes are normal and symmetric.  Psychiatric:        Mood and Affect: Mood is anxious.        Behavior: Behavior normal.        Thought Content:  Thought content normal.        Judgment: Judgment normal.      BP 133/84   Pulse (!) 105   Temp 97.9 F (36.6 C) (Oral)   Ht '4\' 11"'$  (1.499 m)   Wt 212 lb 12.8 oz (96.5 kg)   BMI 42.98 kg/m      Assessment & Plan:  Sheila [name] comes in today with chief complaint of Depression   Diagnosis and orders addressed:  1. Moderate episode of recurrent major depressive disorder (HCC) - DULoxetine (CYMBALTA) 60 MG capsule; Take 1 capsule (60 mg total) by mouth daily.  Dispense: 90 capsule; Refill: 1 - CMP14+EGFR  2. GAD (generalized anxiety disorder) - DULoxetine (CYMBALTA) 60 MG capsule; Take 1 capsule  (60 mg total) by mouth daily.  Dispense: 90 capsule; Refill: 1 - CMP14+EGFR  3. Uncomplicated opioid dependence ([place]) - CMP14+EGFR  4. Pain medication agreement signed - CMP14+EGFR - ToxASSURE Select 13 (MW), Urine  5. PUD (peptic ulcer disease) - CMP14+EGFR  6. Hypothyroidism, unspecified type - CMP14+EGFR - TSH  7. Primary osteoarthritis involving multiple joints - CMP14+EGFR - ToxASSURE Select 13 (MW), Urine  8. Chronic bilateral low back pain, unspecified whether sciatica present - CMP14+EGFR - ToxASSURE Select 13 (MW), Urine   Labs pending Pt reviewed in [place] controlled [place] controlled database- No red flags noted Referral placed for [facility] today, discussed with patient what this service was in detail and importance of using them.  Health Maintenance reviewed Diet and exercise encouraged  Follow up plan: 2 months    [name], FNP

## 2018-05-04 ENCOUNTER — Telehealth: Payer: Self-pay

## 2018-05-04 LAB — TSH: TSH: 4.41 u[IU]/mL (ref 0.450–4.500)

## 2018-05-04 LAB — CMP14+EGFR
ALT: 21 IU/L (ref 0–32)
AST: 28 IU/L (ref 0–40)
Albumin/Globulin Ratio: 1.7 (ref 1.2–2.2)
Albumin: 4.6 g/dL (ref 3.7–4.7)
Alkaline Phosphatase: 78 IU/L (ref 39–117)
BUN/Creatinine Ratio: 26 (ref 12–28)
BUN: 21 mg/dL (ref 8–27)
Bilirubin Total: 0.3 mg/dL (ref 0.0–1.2)
CO2: 20 mmol/L (ref 20–29)
Calcium: 11.2 mg/dL — ABNORMAL HIGH (ref 8.7–10.3)
Chloride: 100 mmol/L (ref 96–106)
Creatinine, Ser: 0.82 mg/dL (ref 0.57–1.00)
GFR calc Af Amer: 80 mL/min/{1.73_m2} (ref >59)
GFR calc non Af Amer: 70 mL/min/{1.73_m2} (ref >59)
Globulin, Total: 2.7 g/dL (ref 1.5–4.5)
Glucose: 123 mg/dL — ABNORMAL HIGH (ref 65–99)
Potassium: 5.2 mmol/L (ref 3.5–5.2)
Sodium: 137 mmol/L (ref 134–144)
Total Protein: 7.3 g/dL (ref 6.0–8.5)

## 2018-05-05 ENCOUNTER — Other Ambulatory Visit: Payer: Self-pay | Admitting: *Deleted

## 2018-05-05 ENCOUNTER — Telehealth: Payer: Self-pay

## 2018-05-05 DIAGNOSIS — F331 Major depressive disorder, recurrent, moderate: Secondary | ICD-10-CM

## 2018-05-05 NOTE — BH Specialist Note (Signed)
[place] Initial Clinical Assessment  MRN: [id number] NAME: Sheila [name] Date: 05/05/18  Total time: 1 hour  Type of Contact: Type of Contact: Phone Call Initial Contact Patient consent obtained: Patient consent obtained for Virtual Visit: (NA) Reason for Visit today: Reason for Your Call/Visit Today: [facility] Initial Intake Assessment   Treatment History Patient recently received Inpatient Treatment: Have You Recently Been in Any Inpatient Treatment (Hospital/Detox/Crisis Center/28-Day Program)?: No  Facility/Program:  NA  Date of discharge:  NA Patient currently being seen by therapist/psychiatrist: Do You Currently Have a Therapist/Psychiatrist?: No Patient currently receiving the following services: Patient Currently Receiving the Following Services:: Medication Management(PCP prescribes medication )    Psychiatric History  Past Psychiatric History/Hospitalization(s): Anxiety: No Bipolar Disorder: No Depression: Yes Mania: No Psychosis: No Schizophrenia: No Personality Disorder: No Hospitalization for psychiatric illness: No History of Electroconvulsive Shock Therapy: No Prior Suicide Attempts: No Decreased need for sleep: No  Euphoria: No Self Injurious behaviors No Family History of mental illness: No Family History of substance abuse: No  Substance Abuse: No  DUI: No  Insomnia: No  History of violence No  Physical, sexual or emotional abuse:No  Prior outpatient mental health therapy: No       Clinical Assessment:   PHQ-9 Assessments: Depression screen [facility] 2/9 05/05/2018 05/03/2018 04/05/2018  Decreased Interest 2 2 0  Down, Depressed, Hopeless 2 2 0  PHQ - 2 Score 4 4 0  Altered sleeping 3 3 -  Tired, decreased energy 2 3 -  Change in appetite 1 3 -  Feeling bad or failure about yourself  2 1 -  Trouble concentrating 2 0 -  Moving slowly or fidgety/restless 0 0 -  Suicidal thoughts 0 0 -  PHQ-9 Score 14 14 -  Difficult doing work/chores  Somewhat difficult - -  Some recent data might be hidden     GAD-7 Assessments: GAD 7 : Generalized Anxiety Score 05/05/2018 04/29/2015 04/29/2015  Nervous, Anxious, on Edge 1 1 0  Control/stop worrying 0 0 -  Worry too much - different things 0 0 -  Trouble relaxing 0 0 -  Restless 0 0 -  Easily annoyed or irritable 0 0 -  Afraid - awful might happen 0 0 -  Total GAD 7 Score 1 1 -  Anxiety Difficulty Not difficult at all - -     Social Functioning Social maturity: Social Maturity: Responsible Social judgement: Social Judgement: Normal  Stress Current stressors: Current Stressors: (Psychiatric medication is not working her.) Familial stressors: Familial Stressors: None Sleep: Sleep: Decreased, Difficulty falling asleep, Difficulty staying asleep Appetite: Appetite: Decreased Coping ability:    Patient taking medications as prescribed: Patient taking medications as prescribed: Yes  Current medications:  Outpatient Encounter Medications as of 05/05/2018  Medication Sig  . albuterol (PROVENTIL) (2.5 MG/3ML) 0.083% nebulizer solution NEBULIZE 1 VIAL EVERY 6-8 HOURS AS NEEDED FOR WHEEZING OR SHORTNESS OF BREATH  . ALPRAZolam (XANAX) 0.5 MG tablet TAKE (1) TABLET TWICE A DAY.  . budesonide-formoterol (SYMBICORT) 80-4.5 MCG/ACT inhaler INHALE 2 PUFFS 2 TIMES A DAY  . diclofenac sodium (VOLTAREN) 1 % GEL Apply 2 g topically 4 (four) times daily.  . DULoxetine (CYMBALTA) 60 MG capsule Take 1 capsule (60 mg total) by mouth daily.  [name] estradiol (ESTRACE) 0.1 MG/GM vaginal cream Place 1 Applicatorful vaginally every Monday, Wednesday, and Friday. Reported on 08/07/2015  . furosemide (LASIX) 20 MG tablet Take 1 tablet (20 mg total) by mouth daily.  [name] gabapentin (  NEURONTIN) 300 MG capsule TAKE (1) CAPSULE THREE TIMES DAILY.  [name] levothyroxine (SYNTHROID) 88 MCG tablet Take 1 tablet (88 mcg total) by mouth daily.  . Multiple Vitamins-Minerals (CENTRUM SILVER ADULT 50+ PO) Take 1 tablet by mouth  every other day.  . pantoprazole (PROTONIX) 20 MG tablet TAKE 1 TABLET DAILY  . Pitavastatin Calcium (LIVALO) 2 MG TABS Take 1 tablet (2 mg total) by mouth daily.  . potassium chloride SA (K-DUR,KLOR-CON) 20 MEQ tablet TAKE (1) TABLET TWICE A DAY.  . traMADol (ULTRAM) 50 MG tablet Take 2 tablets (100 mg total) by mouth every 12 (twelve) hours as needed.   Facility-Administered Encounter Medications as of 05/24/2018  Medication  . denosumab (PROLIA) injection 60 mg    Self-harm Behaviors Risk Assessment Self-harm risk factors: Self-harm risk factors: (None Reported) Patient endorses recent thoughts of harming self: Have you recently had any thoughts about harming yourself?: No    [place] Suicide Severity Rating Scale:  C-SRSS 24-May-2018  1. Wish to be Dead No  2. Suicidal Thoughts No  6. Suicide Behavior Question No    Danger to Others Risk Assessment Danger to others risk factors: Danger to Others Risk Factors: No risk factors noted Patient endorses recent thoughts of harming others: Notification required: No need or identified person  Dynamic Appraisal of Situational Aggression (DASA): No flowsheet data found.  Substance Use Assessment Patient recently consumed alcohol:  None Reported  Alcohol Use Disorder Identification Test (AUDIT):  Alcohol Use Disorder Test (AUDIT) May 24, 2018  1. How often do you have a drink containing alcohol? 0  2. How many drinks containing alcohol do you have on a typical day when you are drinking? 0  3. How often do you have six or more drinks on one occasion? 0  AUDIT-C Score 0  Alcohol Brief Interventions/Follow-up AUDIT Score <7 follow-up not indicated   Patient recently used drugs:  None Reported Patient is concerned about dependence or abuse of substances:  None Reported    Goals, Interventions and Follow-up Plan Goals: Increase healthy adjustment to current life circumstances Interventions: Motivational Interviewing and Supportive  Counseling Follow-up Plan: [facility] Phone Follow UP   Summary of Clinical Assessment Summary:    Patient is 78 year old female.  Referral from the [facility] referral que from [facility].     Stressors:  Patient reports that her psychiatric medication is not working for her.   Patient reports that she is always sad and cries every other week.  Patient reports that her nephew recently moved out.  Her nephew had lived with her for over a year and a half before he moved out.  Patient reports increased loneliness.  Patient reports poor sleep.  Patient reports that the pills that she takes at night do not help her to sleep.   Patient does not remember the name of the medication to help her to sleep.    Medication:  Patient reports that she takes Cymbalta 60 mg once daily.  Patient reports that she takes Xanax 0.5mg  twice a day.  Patient reports that she has been taking her psychiatric medication for several years.    Social:  Patient lives alone and she is not married.  Patient has 2 adult children.  Patient has 3 grandchildren and 2 great grandchildren.  Patient has a nurses aid that comes to her home at 10am until 4pm daily to assist her.   Patient reports that she is retired Patient reports prior outpatient counseling, but she does not remember the  year that she received counseling.  Patient denies prior inpatient psychiatric hospitalization.  Patient reports that she had medical problems with knees and back.  Patient reports that she enjoys going to flea markets with her sister.  Patient has an exercise bike at her home.   Patient denies SI/HI/Psychosis/Substance Abuse. If your symptoms worsen or you have thoughts of suicide/homicide, PLEASE SEEK IMMEDIATE MEDICAL ATTENTION.  You may always call:  National Suicide Hotline: 605-706-2587; [place] Crisis Line: 548-786-8804; Crisis Recovery in [place]: [phone].  These are available 24 hours a day, 7 days a week.   During the next session:    Writer will discuss medication recommendations with Dr. Modesta Messing.     Graciella Freer [place], LCAS-A

## 2018-05-08 LAB — TOXASSURE SELECT 13 (MW), URINE

## 2018-06-08 ENCOUNTER — Encounter: Payer: Self-pay | Admitting: Family

## 2018-06-08 ENCOUNTER — Ambulatory Visit (INDEPENDENT_AMBULATORY_CARE_PROVIDER_SITE_OTHER): Payer: Medicare Other | Admitting: Family

## 2018-06-08 ENCOUNTER — Other Ambulatory Visit: Payer: Self-pay

## 2018-06-08 DIAGNOSIS — A084 Viral intestinal infection, unspecified: Secondary | ICD-10-CM | POA: Diagnosis not present

## 2018-06-08 DIAGNOSIS — R197 Diarrhea, unspecified: Secondary | ICD-10-CM | POA: Diagnosis not present

## 2018-06-08 MED ORDER — ONDANSETRON HCL 4 MG PO TABS: 4.0000 mg | ORAL_TABLET | Freq: Three times a day (TID) | ORAL | 0 refills | Status: DC | PRN

## 2018-06-08 NOTE — Progress Notes (Signed)
   Virtual Visit via telephone Note  I connected with Sheila [name] on 06/08/18 at 3:34 pm by telephone and verified that I am speaking with the correct person using two identifiers. Sheila [name] is currently located at home and no one is currently with her during visit. The provider, [name], FNP is located in their office at time of visit.  I discussed the limitations, risks, security and privacy concerns of performing an evaluation and management service by telephone and the availability of in person appointments. I also discussed with the patient that there may be a patient responsible charge related to this service. The patient expressed understanding and agreed to proceed.   History and Present Illness:    Diarrhea   This is a new problem. The problem occurs 2 to 4 times per day. The problem has been waxing and waning. The stool consistency is described as watery. The patient states that diarrhea does not awaken her from sleep. Associated symptoms include abdominal pain and chills. Pertinent negatives include no bloating, coughing, fever, headaches, increased  flatus or vomiting. Associated symptoms comments: nauseous . There are no known risk factors. She has tried increased fluids and anti-motility drug for the symptoms. The treatment provided moderate relief.      Review of Systems  Constitutional: Positive for chills. Negative for fever.  Respiratory: Negative for cough.   Gastrointestinal: Positive for abdominal pain and diarrhea. Negative for bloating, flatus and vomiting.  Neurological: Negative for headaches.  All other systems reviewed and are negative.   Observations/Objective: No SOB or distress noted  Assessment and Plan: 1. Diarrhea, unspecified type - ondansetron (ZOFRAN) 4 MG tablet; Take 1 tablet (4 mg total) by mouth every 8 (eight) hours as needed for nausea or vomiting.  Dispense: 20 tablet; Refill: 0  2. Viral gastroenteritis [place] diet Good  hand hygiene  Force fluids Zofran as needed for nausea and continue Imodium for diarrhea  Call if symptoms worsens or do not improve  - ondansetron (ZOFRAN) 4 MG tablet; Take 1 tablet (4 mg total) by mouth every 8 (eight) hours as needed for nausea or vomiting.  Dispense: 20 tablet; Refill: 0     I discussed the assessment and treatment plan with the patient. The patient was provided an opportunity to ask questions and all were answered. The patient agreed with the plan and demonstrated an understanding of the instructions.   The patient was advised to call back or seek an in-person evaluation if the symptoms worsen or if the condition fails to improve as anticipated.  The above assessment and management plan was discussed with the patient. The patient verbalized understanding of and has agreed to the management plan. Patient is aware to call the clinic if symptoms persist or worsen. Patient is aware when to return to the clinic for a follow-up visit. Patient educated on when it is appropriate to go to the emergency department.    Call ended 3:44 pm, I provided 10 minutes of non-face-to-face time during this encounter.    [name], FNP

## 2018-06-29 ENCOUNTER — Other Ambulatory Visit: Payer: Self-pay | Admitting: Family

## 2018-06-29 DIAGNOSIS — F411 Generalized anxiety disorder: Secondary | ICD-10-CM

## 2018-06-30 ENCOUNTER — Telehealth: Payer: Self-pay | Admitting: Family

## 2018-06-30 DIAGNOSIS — F411 Generalized anxiety disorder: Secondary | ICD-10-CM

## 2018-06-30 MED ORDER — ALPRAZOLAM 0.5 MG PO TABS: ORAL_TABLET | ORAL | 2 refills | Status: DC

## 2018-06-30 NOTE — Telephone Encounter (Signed)
Pt has appt 07/04/18 with [facility] for 3 month follow up. Can you refill xanax now? At least enough to get her through until appt?

## 2018-06-30 NOTE — Telephone Encounter (Signed)
Prescription sent to pharmacy.

## 2018-06-30 NOTE — Telephone Encounter (Signed)
PT is wanting to know why her xanax was cancelled, states went to pharmacy to get meds and wasn't able to pick that one up

## 2018-07-01 ENCOUNTER — Other Ambulatory Visit: Payer: Self-pay

## 2018-07-04 ENCOUNTER — Telehealth: Payer: Self-pay | Admitting: Family

## 2018-07-04 ENCOUNTER — Ambulatory Visit: Payer: Medicare Other | Admitting: Family

## 2018-07-04 NOTE — Telephone Encounter (Signed)
appt made for 5/12- daughter aware

## 2018-07-04 NOTE — Telephone Encounter (Signed)
Lm - need to find out why she needs appt ?? LM for her to call back .

## 2018-07-05 ENCOUNTER — Other Ambulatory Visit: Payer: Self-pay

## 2018-07-05 ENCOUNTER — Encounter: Payer: Self-pay | Admitting: Family

## 2018-07-05 ENCOUNTER — Ambulatory Visit (INDEPENDENT_AMBULATORY_CARE_PROVIDER_SITE_OTHER): Payer: Medicare Other | Admitting: Family

## 2018-07-05 DIAGNOSIS — F331 Major depressive disorder, recurrent, moderate: Secondary | ICD-10-CM

## 2018-07-05 DIAGNOSIS — M545 Low back pain, unspecified: Secondary | ICD-10-CM

## 2018-07-05 DIAGNOSIS — I10 Essential (primary) hypertension: Secondary | ICD-10-CM | POA: Diagnosis not present

## 2018-07-05 DIAGNOSIS — E785 Hyperlipidemia, unspecified: Secondary | ICD-10-CM

## 2018-07-05 DIAGNOSIS — F1120 Opioid dependence, uncomplicated: Secondary | ICD-10-CM

## 2018-07-05 DIAGNOSIS — Z0289 Encounter for other administrative examinations: Secondary | ICD-10-CM

## 2018-07-05 DIAGNOSIS — F411 Generalized anxiety disorder: Secondary | ICD-10-CM

## 2018-07-05 DIAGNOSIS — M150 Primary generalized (osteo)arthritis: Secondary | ICD-10-CM

## 2018-07-05 DIAGNOSIS — J449 Chronic obstructive pulmonary disease, unspecified: Secondary | ICD-10-CM

## 2018-07-05 DIAGNOSIS — G8929 Other chronic pain: Secondary | ICD-10-CM

## 2018-07-05 DIAGNOSIS — M159 Polyosteoarthritis, unspecified: Secondary | ICD-10-CM

## 2018-07-05 DIAGNOSIS — E039 Hypothyroidism, unspecified: Secondary | ICD-10-CM

## 2018-07-05 DIAGNOSIS — M8949 Other hypertrophic osteoarthropathy, multiple sites: Secondary | ICD-10-CM

## 2018-07-05 NOTE — Progress Notes (Signed)
Virtual Visit via telephone Note  I connected with Sheila [name] on 07/05/18 at 11:03 Am by telephone and verified that I am speaking with the correct person using two identifiers. Sheila [name] is currently located at home  and daughter  is currently with her during visit. The provider, [name], FNP is located in their office at time of visit.  I discussed the limitations, risks, security and privacy concerns of performing an evaluation and management service by telephone and the availability of in person appointments. I also discussed with the patient that there may be a patient responsible charge related to this service. The patient expressed understanding and agreed to proceed.   History and Present Illness:  Depression         This is a chronic problem.  The current episode started more than 1 year ago.   The onset quality is gradual.   The problem occurs intermittently.  The problem has been waxing and waning since onset.  Associated symptoms include decreased concentration, insomnia, irritable, restlessness, decreased interest and sad.  Associated symptoms include no fatigue, no helplessness, no hopelessness and no headaches.  Past medical history includes thyroid problem and anxiety.   Anxiety  Presents for follow-up visit. Symptoms include decreased concentration, depressed mood, excessive worry, insomnia, irritability, nervous/anxious behavior and restlessness. Patient reports no shortness of breath. Symptoms occur most days. The severity of symptoms is moderate. The quality of sleep is good.    Hypertension  This is a chronic problem. The current episode started more than 1 year ago. The problem is controlled. Associated symptoms include anxiety. Pertinent negatives include no headaches, peripheral edema or shortness of breath. Risk factors for coronary artery disease include dyslipidemia, obesity and sedentary lifestyle. The current treatment provides moderate improvement.  Hypertensive end-organ damage includes kidney disease. Identifiable causes of hypertension include a thyroid problem.  Thyroid Problem  Presents for follow-up visit. Symptoms include anxiety, constipation and depressed mood. Patient reports no fatigue or heat intolerance. The symptoms have been stable.  Arthritis  Presents for follow-up visit. She complains of pain and stiffness. The symptoms have been worsening. Affected locations include the right knee and left knee (back). Her pain is at a severity of 1/10. Pertinent negatives include no fatigue.  Gastroesophageal Reflux  She reports no belching or no heartburn. Pertinent negatives include no fatigue.  COPD  Using Symbicort BID and albuterol twice a week. States her breathing is stable.     Review of Systems  Constitutional: Positive for irritability. Negative for fatigue.  Respiratory: Negative for shortness of breath.   Gastrointestinal: Positive for constipation. Negative for heartburn.  Musculoskeletal: Positive for arthritis and stiffness.  Neurological: Negative for headaches.  Endo/Heme/Allergies: Negative for heat intolerance.  Psychiatric/Behavioral: Positive for decreased concentration and depression. The patient is nervous/anxious and has insomnia.      Observations/Objective: No SOB or distress   Assessment and Plan: BRYTNI DRAY comes in today with chief complaint of No chief complaint on file.   Diagnosis and orders addressed:  1. Chronic bilateral low back pain, unspecified whether sciatica present  2. Chronic obstructive pulmonary disease, unspecified COPD type ([place])  3. Moderate episode of recurrent major depressive disorder ([place])  4. Essential hypertension, benign  5. GAD (generalized anxiety disorder)  6. Hypothyroidism, unspecified type  7. Hyperlipidemia, unspecified hyperlipidemia type   8. Morbid obesity ([place])  9. Uncomplicated opioid dependence ([place])  10. Primary osteoarthritis involving  multiple joints  11. Pain medication agreement signed  Labs reviewed  PT reviewed in [place] controlled database- No red flags noted, we decreasing her Ultram. She is only taking as needed and does not need refill at this time.  Health Maintenance reviewed Diet and exercise encouraged  Follow Up Instructions: 3 months     I discussed the assessment and treatment plan with the patient. The patient was provided an opportunity to ask questions and all were answered. The patient agreed with the plan and demonstrated an understanding of the instructions.   The patient was advised to call back or seek an in-person evaluation if the symptoms worsen or if the condition fails to improve as anticipated.  The above assessment and management plan was discussed with the patient. The patient verbalized understanding of and has agreed to the management plan. Patient is aware to call the clinic if symptoms persist or worsen. Patient is aware when to return to the clinic for a follow-up visit. Patient educated on when it is appropriate to go to the emergency department.   Time call ended: 11:21 AM   I provided 48minutes of non-face-to-face time during this encounter.    [name], FNP

## 2018-07-11 ENCOUNTER — Other Ambulatory Visit: Payer: Self-pay | Admitting: Family

## 2018-07-13 ENCOUNTER — Other Ambulatory Visit: Payer: Self-pay | Admitting: *Deleted

## 2018-07-13 ENCOUNTER — Encounter: Payer: Self-pay | Admitting: *Deleted

## 2018-07-13 MED ORDER — DENOSUMAB 60 MG/ML ~~LOC~~ SOSY: 60.0000 mg | PREFILLED_SYRINGE | Freq: Once | SUBCUTANEOUS | 0 refills | Status: AC

## 2018-07-13 NOTE — Progress Notes (Signed)
PROLIA: Summary of Benefits Interpretation  Jul 13, 2018    Purchase Information  Last purchase location: [employer] . Primary: [employer] Dual Complete . Secondary: Medicaid (If Medicaid, the patient's cost will be $0 and can be purchased at their local pharmacy through their Rx Benefits)  Summary of Benefits  . Received on: 07/04/2018 . Estimated Cost to Patient: $0 . Prior authorization required: No (If Yes, submit on Cover My Meds or refer to the SOB for online form location) o Auth or Reference #: N/A   . Physician Purchase Covered: Yes, patient would owe 20% . Specialty Pharmacy Covered:Yes  with $0 copay o Pharmacy Name: [facility]   Patient notified that Prolia injection is due 09/23/2018, and is covered 100% through her insurance.  Scheduled patient for 09/23/2018 for injection.  Per patient's request ordered Prolia at [facility], and ask them to deliver to [facility].  Will store patient's Prolia injection the our refrigerator until her injection.     Hulen Skains, Rabon Scholle M   07/13/2018 [place] [phone]

## 2018-07-26 ENCOUNTER — Other Ambulatory Visit: Payer: Self-pay | Admitting: Family

## 2018-07-29 ENCOUNTER — Other Ambulatory Visit: Payer: Self-pay | Admitting: Family

## 2018-07-29 DIAGNOSIS — F1120 Opioid dependence, uncomplicated: Secondary | ICD-10-CM

## 2018-07-29 DIAGNOSIS — M159 Polyosteoarthritis, unspecified: Secondary | ICD-10-CM

## 2018-07-29 DIAGNOSIS — M545 Low back pain, unspecified: Secondary | ICD-10-CM

## 2018-07-29 DIAGNOSIS — M8949 Other hypertrophic osteoarthropathy, multiple sites: Secondary | ICD-10-CM

## 2018-07-29 DIAGNOSIS — G8929 Other chronic pain: Secondary | ICD-10-CM

## 2018-07-29 NOTE — Telephone Encounter (Signed)
Pt was seen 07/05/18 and stated she didn't need refills on her Tramadol at the time. Does she need another appt or can you refill?

## 2018-08-19 ENCOUNTER — Other Ambulatory Visit: Payer: Self-pay | Admitting: Family

## 2018-08-22 ENCOUNTER — Encounter: Payer: Self-pay | Admitting: Family Medicine

## 2018-08-22 ENCOUNTER — Other Ambulatory Visit: Payer: Self-pay

## 2018-08-22 ENCOUNTER — Ambulatory Visit (INDEPENDENT_AMBULATORY_CARE_PROVIDER_SITE_OTHER): Payer: Medicare Other | Admitting: Family Medicine

## 2018-08-22 DIAGNOSIS — R110 Nausea: Secondary | ICD-10-CM

## 2018-08-22 MED ORDER — ONDANSETRON HCL 8 MG PO TABS: 8.0000 mg | ORAL_TABLET | Freq: Three times a day (TID) | ORAL | 0 refills | Status: AC | PRN

## 2018-08-23 NOTE — Progress Notes (Signed)
Virtual Visit via telephone Note Due to COVID-19, visit is conducted virtually and was requested by patient. This visit type was conducted due to national recommendations for restrictions regarding the COVID-19 Pandemic (e.g. social distancing) in an effort to limit this patient's exposure and mitigate transmission in our community. All issues noted in this document were discussed and addressed.  A physical exam was not performed with this format.   I connected with Sheila [name] on 08/22/2018 at 1650 by telephone and verified that I am speaking with the correct person using two identifiers. Sheila [name] is currently located at home and family is currently with them during visit. The provider, Monia Pouch, FNP is located in their office at time of visit.  I discussed the limitations, risks, security and privacy concerns of performing an evaluation and management service by telephone and the availability of in person appointments. I also discussed with the patient that there may be a patient responsible charge related to this service. The patient expressed understanding and agreed to proceed.  Subjective:  Patient ID: Sheila [name], female    DOB: 1940/08/17, 78 y.o.   MRN: [id number]  Chief Complaint:  Nausea   HPI: Sheila [name] is a 78 y.o. female presenting on 08/22/2018 for Nausea   Pt reports nausea without vomiting that started on Saturday. States she does not have fever, chills, weakness, fatigue, abdominal pain, diarrhea, or constipation. No urinary or vaginal symptoms. She is able to drink and eat some solids. Is voiding several times per day. She states she has not tried anything for the nausea. No chest pain, cough, shortness of breath, or confusion.     Relevant past medical, surgical, family, and social history reviewed and updated as indicated.  Allergies and medications reviewed and updated.   Past Medical History:  Diagnosis Date   Allergy    Bell's palsy    Cancer  ([place])    skin CA on face   Cataract    Chest pain 07/2009   Myoveiw stress test negative.   Depression    Diverticulosis of colon    DJD (degenerative joint disease) of knee    Dyspnea    on exertion   Family history of adverse reaction to anesthesia    sister ha nausea/vomiting   Gastric ulcer with hemorrhage 01/14/2011   GERD (gastroesophageal reflux disease)    Headache    History of fractured pelvis    Hypercholesteremia    Hypertension    Hypothyroidism    UTI (lower urinary tract infection)     Past Surgical History:  Procedure Laterality Date   APPENDECTOMY     BREAST BIOPSY Left    ESOPHAGOGASTRODUODENOSCOPY  01/14/2011   Procedure: ESOPHAGOGASTRODUODENOSCOPY (EGD);  Surgeon: Rogene Houston, MD;  Location: [facility];  Service: Endoscopy;  Laterality: N/A;   HIP SURGERY     pelvis   JOINT REPLACEMENT     KNEE SURGERY  04/2010.   Total left knee replacement   LAPAROSCOPIC APPENDECTOMY N/A 08/01/2013   Procedure: APPENDECTOMY LAPAROSCOPIC;  Surgeon: Jamesetta So, MD;  Location: [facility];  Service: General;  Laterality: N/A;    Social History   Socioeconomic History   Marital status: Divorced    Spouse name: Not on file   Number of children: Not on file   Years of education: Not on file   Highest education level: Not on file  Occupational History   Not on file  Social Needs  Financial resource strain: Not on file   Food insecurity    Worry: Not on file    Inability: Not on file   Transportation needs    Medical: Not on file    Non-medical: Not on file  Tobacco Use   Smoking status: Former Smoker    Types: Cigarettes    Quit date: 02/24/1968    Years since quitting: 50.5   Smokeless tobacco: Current User    Types: Snuff    Last attempt to quit: 11/23/2017   Tobacco comment: quit yrs and yrs ago when she was very young  Substance and Sexual Activity   Alcohol use: No   Drug use: No   Sexual activity: Not on file    Lifestyle   Physical activity    Days per week: Not on file    Minutes per session: Not on file   Stress: Not on file  Relationships   Social connections    Talks on phone: Not on file    Gets together: Not on file    Attends religious service: Not on file    Active member of club or organization: Not on file    Attends meetings of clubs or organizations: Not on file    Relationship status: Not on file   Intimate partner violence    Fear of current or ex partner: Not on file    Emotionally abused: Not on file    Physically abused: Not on file    Forced sexual activity: Not on file  Other Topics Concern   Not on file  Social History Narrative   Not on file    Outpatient Encounter Medications as of 08/22/2018  Medication Sig   albuterol (PROVENTIL) (2.5 MG/3ML) 0.083% nebulizer solution NEBULIZE 1 VIAL EVERY 6-8 HOURS AS NEEDED FOR WHEEZING OR SHORTNESS OF BREATH   ALPRAZolam (XANAX) 0.5 MG tablet TAKE (1) TABLET TWICE A DAY.   budesonide-formoterol (SYMBICORT) 80-4.5 MCG/ACT inhaler INHALE 2 PUFFS 2 TIMES A DAY   diclofenac sodium (VOLTAREN) 1 % GEL Apply 2 g topically 4 (four) times daily.   DULoxetine (CYMBALTA) 60 MG capsule Take 1 capsule (60 mg total) by mouth daily.   estradiol (ESTRACE) 0.1 MG/GM vaginal cream Place 1 Applicatorful vaginally every Monday, Wednesday, and Friday. Reported on 08/07/2015   furosemide (LASIX) 20 MG tablet Take 1 tablet (20 mg total) by mouth daily.   gabapentin (NEURONTIN) 300 MG capsule TAKE (1) CAPSULE THREE TIMES DAILY.   levothyroxine (SYNTHROID) 88 MCG tablet Take 1 tablet (88 mcg total) by mouth daily.   meclizine (ANTIVERT) 25 MG tablet TAKE (1) TABLET THREE TIMES DAILY AS NEEDED FOR DIZZINESS.   Multiple Vitamins-Minerals (CENTRUM SILVER ADULT 50+ PO) Take 1 tablet by mouth every other day.   ondansetron (ZOFRAN) 8 MG tablet Take 1 tablet (8 mg total) by mouth every 8 (eight) hours as needed for up to 10 days for nausea  or vomiting.   pantoprazole (PROTONIX) 20 MG tablet TAKE 1 TABLET DAILY   Pitavastatin Calcium (LIVALO) 2 MG TABS Take 1 tablet (2 mg total) by mouth daily.   potassium chloride SA (K-DUR,KLOR-CON) 20 MEQ tablet TAKE (1) TABLET TWICE A DAY.   traMADol (ULTRAM) 50 MG tablet TAKE 2 TABLETS EVERY12 HOURS AS NEEDED   [DISCONTINUED] ondansetron (ZOFRAN) 4 MG tablet Take 1 tablet (4 mg total) by mouth every 8 (eight) hours as needed for nausea or vomiting.   Facility-Administered Encounter Medications as of 08/22/2018  Medication   denosumab (  PROLIA) injection 60 mg    Allergies  Allergen Reactions   Ibuprofen     History of bleeding ulcers - contra-indicated    Benadryl [Diphenhydramine Hcl] Other (See Comments)    Worsening depression    Lipitor [Atorvastatin] Other (See Comments)    Body aches.   Hydrocodone     Nausea and vomiting   Codeine Nausea And Vomiting   Morphine And Related Nausea And Vomiting   Tdap [Tetanus-Diphth-Acell Pertussis] Swelling    Redness and localized swelling at injection site     Review of Systems  Constitutional: Positive for appetite change. Negative for activity change, chills, fatigue, fever and unexpected weight change.  Respiratory: Negative for cough, chest tightness, shortness of breath and wheezing.   Cardiovascular: Negative for chest pain, palpitations and leg swelling.  Gastrointestinal: Positive for nausea. Negative for abdominal distention, abdominal pain, anal bleeding, blood in stool, constipation, diarrhea, rectal pain and vomiting.  Genitourinary: Negative for decreased urine volume and difficulty urinating.  Musculoskeletal: Negative for arthralgias and myalgias.  Skin: Negative for color change and pallor.  Neurological: Negative for dizziness, syncope, weakness, light-headedness and headaches.  Psychiatric/Behavioral: Negative for confusion.  All other systems reviewed and are negative.         Observations/Objective: No vital signs or physical exam, this was a telephone or virtual health encounter.  Pt alert and oriented, answers all questions appropriately, and able to speak in full sentences.    Assessment and Plan: Tashyra was seen today for nausea.  Diagnoses and all orders for this visit:  Nausea in adult Nausea without emesis. Tolerating fluids and some solids. No decreased urine output. BRAT diet discussed. Avoid spicy, greasy, or heavy foods. Medications as prescribed. Report any new or worsening symptoms.  -     ondansetron (ZOFRAN) 8 MG tablet; Take 1 tablet (8 mg total) by mouth every 8 (eight) hours as needed for up to 10 days for nausea or vomiting.     Follow Up Instructions: Return if symptoms worsen or fail to improve.    I discussed the assessment and treatment plan with the patient. The patient was provided an opportunity to ask questions and all were answered. The patient agreed with the plan and demonstrated an understanding of the instructions.   The patient was advised to call back or seek an in-person evaluation if the symptoms worsen or if the condition fails to improve as anticipated.  The above assessment and management plan was discussed with the patient. The patient verbalized understanding of and has agreed to the management plan. Patient is aware to call the clinic if symptoms persist or worsen. Patient is aware when to return to the clinic for a follow-up visit. Patient educated on when it is appropriate to go to the emergency department.    I provided 15 minutes of non-face-to-face time during this encounter. The call started at 1650. The call ended at 1705. The other time was used for coordination of care.    Monia Pouch, FNP-C [facility] [address] [place], [place] [address] [phone]

## 2018-08-26 ENCOUNTER — Other Ambulatory Visit: Payer: Self-pay | Admitting: Family

## 2018-08-29 ENCOUNTER — Ambulatory Visit (INDEPENDENT_AMBULATORY_CARE_PROVIDER_SITE_OTHER): Payer: Medicare Other | Admitting: Family

## 2018-08-29 ENCOUNTER — Encounter (HOSPITAL_COMMUNITY): Payer: Self-pay | Admitting: Emergency Medicine

## 2018-08-29 ENCOUNTER — Other Ambulatory Visit: Payer: Self-pay | Admitting: Family

## 2018-08-29 ENCOUNTER — Emergency Department (HOSPITAL_COMMUNITY): Payer: Medicare Other

## 2018-08-29 ENCOUNTER — Emergency Department (HOSPITAL_COMMUNITY)
Admission: EM | Admit: 2018-08-29 | Discharge: 2018-08-29 | Disposition: A | Discharge status: Home / Self Care | Payer: Medicare Other | Attending: Emergency Medicine | Admitting: Emergency Medicine

## 2018-08-29 ENCOUNTER — Other Ambulatory Visit: Payer: Self-pay

## 2018-08-29 ENCOUNTER — Encounter: Payer: Self-pay | Admitting: Family

## 2018-08-29 DIAGNOSIS — R0602 Shortness of breath: Secondary | ICD-10-CM | POA: Insufficient documentation

## 2018-08-29 DIAGNOSIS — F411 Generalized anxiety disorder: Secondary | ICD-10-CM | POA: Diagnosis not present

## 2018-08-29 DIAGNOSIS — R110 Nausea: Secondary | ICD-10-CM | POA: Insufficient documentation

## 2018-08-29 DIAGNOSIS — Z886 Allergy status to analgesic agent status: Secondary | ICD-10-CM | POA: Insufficient documentation

## 2018-08-29 DIAGNOSIS — Z87891 Personal history of nicotine dependence: Secondary | ICD-10-CM | POA: Insufficient documentation

## 2018-08-29 DIAGNOSIS — Z85828 Personal history of other malignant neoplasm of skin: Secondary | ICD-10-CM | POA: Diagnosis not present

## 2018-08-29 DIAGNOSIS — N200 Calculus of kidney: Secondary | ICD-10-CM | POA: Diagnosis not present

## 2018-08-29 DIAGNOSIS — Z885 Allergy status to narcotic agent status: Secondary | ICD-10-CM | POA: Insufficient documentation

## 2018-08-29 DIAGNOSIS — R531 Weakness: Secondary | ICD-10-CM | POA: Diagnosis not present

## 2018-08-29 DIAGNOSIS — Z79899 Other long term (current) drug therapy: Secondary | ICD-10-CM | POA: Diagnosis not present

## 2018-08-29 DIAGNOSIS — R079 Chest pain, unspecified: Secondary | ICD-10-CM

## 2018-08-29 DIAGNOSIS — I10 Essential (primary) hypertension: Secondary | ICD-10-CM | POA: Insufficient documentation

## 2018-08-29 DIAGNOSIS — Z888 Allergy status to other drugs, medicaments and biological substances status: Secondary | ICD-10-CM | POA: Insufficient documentation

## 2018-08-29 DIAGNOSIS — R0789 Other chest pain: Secondary | ICD-10-CM | POA: Diagnosis not present

## 2018-08-29 DIAGNOSIS — E039 Hypothyroidism, unspecified: Secondary | ICD-10-CM | POA: Insufficient documentation

## 2018-08-29 DIAGNOSIS — K5289 Other specified noninfective gastroenteritis and colitis: Secondary | ICD-10-CM | POA: Insufficient documentation

## 2018-08-29 DIAGNOSIS — K8080 Other cholelithiasis without obstruction: Secondary | ICD-10-CM | POA: Diagnosis not present

## 2018-08-29 DIAGNOSIS — J449 Chronic obstructive pulmonary disease, unspecified: Secondary | ICD-10-CM | POA: Insufficient documentation

## 2018-08-29 DIAGNOSIS — E782 Mixed hyperlipidemia: Secondary | ICD-10-CM | POA: Insufficient documentation

## 2018-08-29 DIAGNOSIS — K529 Noninfective gastroenteritis and colitis, unspecified: Secondary | ICD-10-CM

## 2018-08-29 LAB — URINALYSIS, ROUTINE W REFLEX MICROSCOPIC
Bacteria, UA: NONE SEEN
Bilirubin Urine: NEGATIVE
Glucose, UA: NEGATIVE mg/dL
Hgb urine dipstick: NEGATIVE
Ketones, ur: NEGATIVE mg/dL
Nitrite: NEGATIVE
Protein, ur: NEGATIVE mg/dL
Specific Gravity, Urine: 1.016 (ref 1.005–1.030)
pH: 7 (ref 5.0–8.0)

## 2018-08-29 LAB — HEPATIC FUNCTION PANEL
ALT: 55 U/L — ABNORMAL HIGH (ref 0–44)
AST: 70 U/L — ABNORMAL HIGH (ref 15–41)
Albumin: 4.2 g/dL (ref 3.5–5.0)
Alkaline Phosphatase: 64 U/L (ref 38–126)
Bilirubin, Direct: 0.1 mg/dL (ref 0.0–0.2)
Indirect Bilirubin: 0.5 mg/dL (ref 0.3–0.9)
Total Bilirubin: 0.6 mg/dL (ref 0.3–1.2)
Total Protein: 7.5 g/dL (ref 6.5–8.1)

## 2018-08-29 LAB — BASIC METABOLIC PANEL
Anion gap: 12 (ref 5–15)
BUN: 21 mg/dL (ref 8–23)
CO2: 23 mmol/L (ref 22–32)
Calcium: 10.9 mg/dL — ABNORMAL HIGH (ref 8.9–10.3)
Chloride: 104 mmol/L (ref 98–111)
Creatinine, Ser: 0.81 mg/dL (ref 0.44–1.00)
GFR calc Af Amer: >60 mL/min (ref >60)
GFR calc non Af Amer: >60 mL/min (ref >60)
Glucose, Bld: 132 mg/dL — ABNORMAL HIGH (ref 70–99)
Potassium: 4.7 mmol/L (ref 3.5–5.1)
Sodium: 139 mmol/L (ref 135–145)

## 2018-08-29 LAB — CBC
HCT: 41.4 % (ref 36.0–46.0)
Hemoglobin: 13.2 g/dL (ref 12.0–15.0)
MCH: 26.6 pg (ref 26.0–34.0)
MCHC: 31.9 g/dL (ref 30.0–36.0)
MCV: 83.3 fL (ref 80.0–100.0)
Platelets: 393 10*3/uL (ref 150–400)
RBC: 4.97 MIL/uL (ref 3.87–5.11)
RDW: 15.4 % (ref 11.5–15.5)
WBC: 12.2 10*3/uL — ABNORMAL HIGH (ref 4.0–10.5)
nRBC: 0 % (ref 0.0–0.2)

## 2018-08-29 LAB — TROPONIN I (HIGH SENSITIVITY)
Troponin I (High Sensitivity): 16 ng/L (ref <18)
Troponin I (High Sensitivity): 18 ng/L — ABNORMAL HIGH (ref <18)

## 2018-08-29 LAB — LIPASE, BLOOD: Lipase: 41 U/L (ref 11–51)

## 2018-08-29 MED ORDER — AMOXICILLIN-POT CLAVULANATE 875-125 MG PO TABS: 1.0000 | ORAL_TABLET | Freq: Two times a day (BID) | ORAL | 0 refills | Status: DC

## 2018-08-29 MED ORDER — CIPROFLOXACIN IN D5W 400 MG/200ML IV SOLN: 400.0000 mg | Freq: Once | INTRAVENOUS | Status: DC

## 2018-08-29 MED ORDER — IOHEXOL 300 MG/ML  SOLN
100.0000 mL | Freq: Once | INTRAMUSCULAR | Status: AC | PRN
  Administered 2018-08-29: 100 mL via INTRAVENOUS

## 2018-08-29 MED ORDER — ONDANSETRON 4 MG PO TBDP: ORAL_TABLET | ORAL | 0 refills | Status: DC

## 2018-08-29 MED ORDER — AMOXICILLIN-POT CLAVULANATE 875-125 MG PO TABS
1.0000 | ORAL_TABLET | Freq: Once | ORAL | Status: AC
  Administered 2018-08-29: 1 via ORAL
  Filled 2018-08-29: qty 1

## 2018-08-29 MED ORDER — SODIUM CHLORIDE 0.9 % IV BOLUS: 500.0000 mL | Freq: Once | INTRAVENOUS | Status: DC

## 2018-08-29 MED ORDER — METRONIDAZOLE IN NACL 5-0.79 MG/ML-% IV SOLN: 500.0000 mg | Freq: Once | INTRAVENOUS | Status: DC

## 2018-08-29 NOTE — ED Triage Notes (Signed)
Patient complain of chest pain that started yesterday. States pain is a tightness with nausea. States she called her PCP and was sent here to rule out MI.

## 2018-08-29 NOTE — ED Provider Notes (Signed)
[facility] EMERGENCY DEPARTMENT Provider Note   CSN: [id number] Arrival date & time: 08/29/18  1400    History   Chief Complaint Chief Complaint  Patient presents with  . Chest Pain    HPI Sheila [name] is a 78 y.o. female.     HPI Patient states she just does not feel right.  Has been feeling unwell for the past 2 days.  States she feels nauseated when she sits up.  She denies any dizziness.  Patient states she did have some tightness in her chest and some shortness of breath.  She has a dry cough but no sputum production.  Denies any fever or chills.  No new lower extremity swelling or pain.  Advised by PCP to come to the emergency department for evaluation. Past Medical History:  Diagnosis Date  . Allergy   . Bell's palsy   . Cancer ([place])    skin CA on face  . Cataract   . Chest pain 07/2009   Myoveiw stress test negative.  . Depression   . Diverticulosis of colon   . DJD (degenerative joint disease) of knee   . Dyspnea    on exertion  . Family history of adverse reaction to anesthesia    sister ha nausea/vomiting  . Gastric ulcer with hemorrhage 01/14/2011  . GERD (gastroesophageal reflux disease)   . Headache   . History of fractured pelvis   . Hypercholesteremia   . Hypertension   . Hypothyroidism   . UTI (lower urinary tract infection)     Patient Active Problem List   Diagnosis Date Noted  . Osteoarthritis of right shoulder 02/24/2018  . Chronic bilateral low back pain 12/09/2017  . Smokeless tobacco use 11/11/2017  . COPD (chronic obstructive pulmonary disease) ([place]) 09/09/2017  . Unilateral primary osteoarthritis, right knee 03/04/2017  . Spondylolisthesis of lumbar region 08/24/2016  . Opioid dependence ([place]) 04/29/2015  . Pain medication agreement signed 04/29/2015  . Chronic back pain 04/29/2015  . Osteoarthritis 04/29/2015  . Metabolic syndrome 57/84/6962  . Morbid obesity ([place]) 10/18/2014  . Neuropathy 10/18/2014  . Hypokalemia 10/18/2014   . Depressed 09/05/2013  . Osteoporosis 06/16/2012  . PUD (peptic ulcer disease) 06/16/2012  . GAD (generalized anxiety disorder) 06/16/2012  . Hyperlipidemia 06/16/2012  . DJD (degenerative joint disease) 06/16/2012  . Hypercalcemia 06/16/2012  . Essential hypertension, benign 06/16/2012  . Gastric ulcer with hemorrhage 01/14/2011  . Diverticulosis of colon 01/13/2011  . Prerenal azotemia 01/13/2011  . Leukocytosis 01/13/2011  . Sacral insufficiency fracture 01/13/2011  . Hypothyroidism 01/13/2011  . Orthostatic lightheadedness 01/13/2011  . ABNORMAL EKG 07/16/2009    Past Surgical History:  Procedure Laterality Date  . APPENDECTOMY    . BREAST BIOPSY Left   . ESOPHAGOGASTRODUODENOSCOPY  01/14/2011   Procedure: ESOPHAGOGASTRODUODENOSCOPY (EGD);  Surgeon: Rogene Houston, MD;  Location: [facility] ENDO SUITE;  Service: Endoscopy;  Laterality: N/A;  . HIP SURGERY     pelvis  . JOINT REPLACEMENT    . KNEE SURGERY  04/2010.   Total left knee replacement  . LAPAROSCOPIC APPENDECTOMY N/A 08/01/2013   Procedure: APPENDECTOMY LAPAROSCOPIC;  Surgeon: Jamesetta So, MD;  Location: [facility] ORS;  Service: General;  Laterality: N/A;     OB History   No obstetric history on file.      Home Medications    Prior to Admission medications   Medication Sig Start Date End Date Taking? Authorizing Provider  albuterol (PROVENTIL) (2.5 MG/3ML) 0.083% nebulizer solution NEBULIZE 1 VIAL  EVERY 6-8 HOURS AS NEEDED FOR WHEEZING OR SHORTNESS OF BREATH 08/29/18   Loman Brooklyn, FNP  ALPRAZolam Duanne Moron) 0.5 MG tablet TAKE (1) TABLET TWICE A DAY. 06/30/18   [name] A, FNP  amoxicillin-clavulanate (AUGMENTIN) 875-125 MG tablet Take 1 tablet by mouth 2 (two) times daily. One po bid x 7 days 08/30/18   Julianne Rice, MD  budesonide-formoterol [facility]) 80-4.5 MCG/ACT inhaler INHALE 2 PUFFS 2 TIMES A DAY 08/26/18   [name] A, FNP  diclofenac sodium (VOLTAREN) 1 % GEL Apply 2 g topically 4 (four) times  daily. 02/24/18   Baruch Gouty, FNP  DULoxetine (CYMBALTA) 60 MG capsule Take 1 capsule (60 mg total) by mouth daily. 05/03/18   Sharion Balloon, FNP  estradiol (ESTRACE) 0.1 MG/GM vaginal cream Place 1 Applicatorful vaginally every Monday, Wednesday, and Friday. Reported on 08/07/2015    [provider]  furosemide (LASIX) 20 MG tablet Take 1 tablet (20 mg total) by mouth daily. 05/03/18   [name] A, FNP  gabapentin (NEURONTIN) 300 MG capsule TAKE (1) CAPSULE THREE TIMES DAILY. 07/29/18   Sharion Balloon, FNP  levothyroxine (SYNTHROID) 88 MCG tablet Take 1 tablet (88 mcg total) by mouth daily. 04/06/18 04/06/19  Sharion Balloon, FNP  meclizine (ANTIVERT) 25 MG tablet TAKE (1) TABLET THREE TIMES DAILY AS NEEDED FOR DIZZINESS. 08/19/18   Sharion Balloon, FNP  Multiple Vitamins-Minerals (CENTRUM SILVER ADULT 50+ PO) Take 1 tablet by mouth every other day.    [provider]  ondansetron (ZOFRAN ODT) 4 MG disintegrating tablet 4mg  ODT q4 hours prn nausea/vomit 08/29/18   Julianne Rice, MD  ondansetron (ZOFRAN) 8 MG tablet Take 1 tablet (8 mg total) by mouth every 8 (eight) hours as needed for up to 10 days for nausea or vomiting. 08/22/18 09/01/18  Baruch Gouty, FNP  pantoprazole (PROTONIX) 20 MG tablet TAKE 1 TABLET DAILY 04/05/18   Setzer, Rona Ravens, NP  Pitavastatin Calcium (LIVALO) 2 MG TABS Take 1 tablet (2 mg total) by mouth daily. 05/03/18   Sharion Balloon, FNP  potassium chloride SA (K-DUR,KLOR-CON) 20 MEQ tablet TAKE (1) TABLET TWICE A DAY. 05/03/18   Sharion Balloon, FNP  traMADol (ULTRAM) 50 MG tablet TAKE 2 TABLETS EVERY12 HOURS AS NEEDED 08/01/18   Sharion Balloon, FNP    Family History Family History  Problem Relation Age of Onset  . Aneurysm Mother   . Stroke Mother   . Hypertension Mother   . Cancer Father        lung  . Heart disease Sister   . Hip fracture Sister   . Cancer Brother   . Alzheimer's disease Sister   . Cancer Sister        skin, uterine  .  Cancer Brother     Social History Social History   Tobacco Use  . Smoking status: Former Smoker    Types: Cigarettes    Quit date: 02/24/1968    Years since quitting: 50.5  . Smokeless tobacco: Current User    Types: Snuff    Last attempt to quit: 11/23/2017  . Tobacco comment: quit yrs and yrs ago when she was very young  Substance Use Topics  . Alcohol use: No  . Drug use: No     Allergies   Ibuprofen, Benadryl [diphenhydramine hcl], Lipitor [atorvastatin], Hydrocodone, Codeine, Morphine and related, and Tdap [tetanus-diphth-acell pertussis]   Review of Systems Review of Systems  Constitutional: Positive for fatigue. Negative for  chills and fever.  HENT: Negative for trouble swallowing.   Eyes: Negative for visual disturbance.  Respiratory: Positive for cough, chest tightness and shortness of breath.   Cardiovascular: Negative for chest pain, palpitations and leg swelling.  Gastrointestinal: Positive for nausea and vomiting. Negative for abdominal pain, blood in stool, constipation and diarrhea.  Genitourinary: Negative for dysuria, flank pain and frequency.  Musculoskeletal: Negative for back pain, myalgias and neck pain.  Skin: Negative for rash and wound.  Neurological: Negative for dizziness, weakness, light-headedness, numbness and headaches.  All other systems reviewed and are negative.    Physical Exam Updated Vital Signs BP 123/75   Pulse (!) 108   Temp 99.8 F (37.7 C) (Oral)   Resp (!) 25   Ht 5\' 4"  (1.626 m)   Wt 100.2 kg   SpO2 95%   BMI 37.93 kg/m   Physical Exam Vitals signs and nursing note reviewed.  Constitutional:      General: She is not in acute distress.    Appearance: Normal appearance. She is well-developed. She is not ill-appearing.  HENT:     Head: Normocephalic and atraumatic.     Comments: No facial asymmetry.    Nose: Nose normal.     Mouth/Throat:     Mouth: Mucous membranes are moist.  Eyes:     Extraocular Movements:  Extraocular movements intact.     Pupils: Pupils are equal, round, and reactive to light.  Neck:     Musculoskeletal: Normal range of motion and neck supple. No neck rigidity or muscular tenderness.  Cardiovascular:     Rate and Rhythm: Normal rate and regular rhythm.     Heart sounds: No murmur. No friction rub. No gallop.   Pulmonary:     Effort: Pulmonary effort is normal. No respiratory distress.     Breath sounds: No stridor. No wheezing, rhonchi or rales.     Comments: Diminished breath sounds throughout without obvious respiratory distress. Chest:     Chest wall: No tenderness.  Abdominal:     General: Bowel sounds are normal.     Palpations: Abdomen is soft.     Tenderness: There is abdominal tenderness. There is no guarding or rebound.     Comments: Left upper quadrant tenderness to palpation.  No rebound or guarding.  Musculoskeletal: Normal range of motion.        General: No swelling, tenderness, deformity or signs of injury.     Right lower leg: No edema.  Lymphadenopathy:     Cervical: No cervical adenopathy.  Skin:    General: Skin is warm and dry.     Findings: No erythema or rash.  Neurological:     General: No focal deficit present.     Mental Status: She is alert and oriented to person, place, and time.     Comments: Moving all extremities without focal deficit.  Sensation intact.  Psychiatric:        Behavior: Behavior normal.      ED Treatments / Results  Labs (all labs ordered are listed, but only abnormal results are displayed) Labs Reviewed  BASIC METABOLIC PANEL - Abnormal; Notable for the following components:      Result Value   Glucose, Bld 132 (*)    Calcium 10.9 (*)    All other components within normal limits  CBC - Abnormal; Notable for the following components:   WBC 12.2 (*)    All other components within normal limits  TROPONIN I (HIGH SENSITIVITY) -  Abnormal; Notable for the following components:   Troponin I (High Sensitivity)  18.00 (*)    All other components within normal limits  URINALYSIS, ROUTINE W REFLEX MICROSCOPIC - Abnormal; Notable for the following components:   APPearance HAZY (*)    Leukocytes,Ua SMALL (*)    Non Squamous Epithelial 0-5 (*)    All other components within normal limits  HEPATIC FUNCTION PANEL - Abnormal; Notable for the following components:   AST 70 (*)    ALT 55 (*)    All other components within normal limits  TROPONIN I (HIGH SENSITIVITY)  LIPASE, BLOOD    EKG EKG Interpretation  Date/Time:  Monday August 29 2018 14:11:10 EDT Ventricular Rate:  119 PR Interval:  150 QRS Duration: 80 QT Interval:  302 QTC Calculation: 424 R Axis:   23 Text Interpretation:  Sinus tachycardia Nonspecific ST and T wave abnormality Abnormal ECG Confirmed by Julianne Rice [phone]) on 08/29/2018 3:15:56 PM   Radiology Dg Chest 2 View  Result Date: 08/29/2018 CLINICAL DATA:  Chest pain. EXAM: CHEST - 2 VIEW COMPARISON:  07/26/2017. FINDINGS: Mediastinum and hilar structures normal. Mild peribronchial cuffing, bronchitis cannot be excluded. No focal alveolar infiltrate. No pleural effusion or pneumothorax. Degenerative change thoracic spine. Left posterolateral ninth rib fracture, age undetermined. Stable lower thoracic vertebral body moderate compression fracture. IMPRESSION: 1.  Mild peribronchial cuffing.  Bronchitis cannot be excluded. 2. Left posterolateral ninth rib fracture noted. Age is undetermined. Stable lower thoracic vertebral body moderate compression fracture. No pneumothorax. Electronically Signed   By: Marcello Moores  Register   On: 08/29/2018 14:37   Ct Abdomen Pelvis W Contrast  Result Date: 08/29/2018 CLINICAL DATA:  Generalized abdominal pain with fever EXAM: CT ABDOMEN AND PELVIS WITH CONTRAST TECHNIQUE: Multidetector CT imaging of the abdomen and pelvis was performed using the standard protocol following bolus administration of intravenous contrast. CONTRAST:  134mL OMNIPAQUE IOHEXOL  300 MG/ML SOLN COMPARISON:  CT abdomen pelvis July 31, 2013, November 20 FINDINGS: Lower chest: Respiratory motion limits evaluation of the parenchyma. Scarring and/or atelectasis in the bases. Hepatobiliary: No focal liver abnormality is seen. Partially calcified gallstone at the level of the gallbladder neck. No gallbladder wall thickening, or biliary dilatation. Pancreas: Unremarkable. No pancreatic ductal dilatation or surrounding inflammatory changes. Spleen: Normal in size without focal abnormality. Adrenals/Urinary Tract: Adrenal glands are unremarkable. Few nonobstructive right renal calculi in the lower pole. Subcentimeter hypoattenuating foci in the left kidney are too small to characterize though statistically likely benign. No concerning renal lesions. No obstructive uropathy. Urinary bladder is unremarkable. Stomach/Bowel: Stomach is within normal limits. Appendix appears normal. Scattered colonic diverticula without focal pericolonic inflammatory features to suggest diverticulitis. Mild segmental wall thickening of the descending and sigmoid colon with mucosal hyperenhancement. No other evidence of bowel wall thickening, distention, or inflammatory changes. Vascular/Lymphatic: Aortic atherosclerosis. No enlarged abdominal or pelvic lymph nodes. Reproductive: Normal anteverted uterus with few subcentimeter subserosal calcified fibroids. No concerning adnexal lesions. Other: Slight interval increase in size of a fat containing umbilical hernia with a 1.5 cm fascial defect. New fat containing inguinal hernias are present as well, right slightly greater than left. No bowel containing hernia. No abdominal ascites. No intra-abdominal free fluid or gas. Musculoskeletal: No acute or significant osseous findings. Redemonstration of an L4-5 posterior spinal fusion without hardware complication. Stable sclerotic changes in the bilateral sacral ala. IMPRESSION: 1. Mild segmental thickening of the distal colon  without significant pericolonic stranding. Could reflect a mild colitis given acute symptoms. Alternatively, sequela  of prior inflammation or diverticulitis could have a similar appearance. 2. Cholelithiasis without evidence of acute cholecystitis. 3. Nonobstructing calculi in the lower pole right kidney. No obstructive uropathy. 4. Leiomyomatous uterus. 5. Slight interval enlargement of the AA and fat containing umbilical hernia. New fat containing inguinal hernias. No bowel containing hernia. Electronically Signed   By: MD Lovena Le   On: 08/29/2018 20:47    Procedures Procedures (including critical care time)  Medications Ordered in ED Medications  sodium chloride 0.9 % bolus 500 mL (has no administration in time range)  amoxicillin-clavulanate (AUGMENTIN) 875-125 MG per tablet 1 tablet (has no administration in time range)  iohexol (OMNIPAQUE) 300 MG/ML solution 100 mL (100 mLs Intravenous Contrast Given 08/29/18 1947)     Initial Impression / Assessment and Plan / ED Course  I have reviewed the triage vital signs and the nursing notes.  Pertinent labs & imaging results that were available during my care of the patient were reviewed by me and considered in my medical decision making (see chart for details).       Chest pain is very atypical.  Troponin x2 with no significant change.  Most of patient's pain seems to be in her upper abdomen.  CT scan with evidence of likely mild colitis.  Admission was offered to the patient but she declined.  Given first dose of Augmentin in the emergency department will discharge with course.  Understands the need to follow-up closely with her primary physician and strict return precautions have been given.   Final Clinical Impressions(s) / ED Diagnoses   Final diagnoses:  Atypical chest pain  Colitis    ED Discharge Orders         Ordered    amoxicillin-clavulanate (AUGMENTIN) 875-125 MG tablet  2 times daily     08/29/18 2124    ondansetron  (ZOFRAN ODT) 4 MG disintegrating tablet     08/29/18 2124           Julianne Rice, MD 08/29/18 2126

## 2018-08-29 NOTE — Progress Notes (Signed)
   Virtual Visit via telephone Note  I connected with Sheila [name] on 08/29/18 at 11:57 AM by telephone and verified that I am speaking with the correct person using two identifiers. Sheila [name] is currently located at [facility] and no one is currently with her during visit. The provider, [name], FNP is located in their office at time of visit.  I discussed the limitations, risks, security and privacy concerns of performing an evaluation and management service by telephone and the availability of in person appointments. I also discussed with the patient that there may be a patient responsible charge related to this service. The patient expressed understanding and agreed to proceed.   History and Present Illness:  PT calls to the office today with chest pain that started Saturday. She states she just cried on Saturday by herself because she felt so bad. She states it is hard to say how she feels, but states she just feels weak and having tightness in her chest. She states feels like she can not walk across the floor.   She has a history of anxiety and is unsure if this is related to the GAD.  Chest Pain  This is a recurrent problem. The current episode started in the past 7 days. The onset quality is sudden. The pain is present in the substernal region. The pain is at a severity of 1/10. The pain is mild. The quality of the pain is described as tightness. The pain does not radiate. Associated symptoms include back pain, lower extremity edema ('A little bit"), malaise/fatigue and weakness. Pertinent negatives include no dizziness, nausea or shortness of breath. The pain is aggravated by emotional upset. She has tried rest for the symptoms.      Review of Systems  Constitutional: Positive for malaise/fatigue.  Respiratory: Negative for shortness of breath.   Cardiovascular: Positive for chest pain.  Gastrointestinal: Negative for nausea.  Musculoskeletal: Positive for back pain.   Neurological: Positive for weakness. Negative for dizziness.  All other systems reviewed and are negative.    Observations/Objective: No SOB or distress   Assessment and Plan: 1. Chest pain, unspecified type  2. Weakness  3. GAD (generalized anxiety disorder)  Given symptoms of extreme weakness and fatigue with chest pain will send to ED for evaluation  Unsure if this is related to her anxiety, but needs to have further work up  She states she will go to ED     I discussed the assessment and treatment plan with the patient. The patient was provided an opportunity to ask questions and all were answered. The patient agreed with the plan and demonstrated an understanding of the instructions.   The patient was advised to call back or seek an in-person evaluation if the symptoms worsen or if the condition fails to improve as anticipated.  The above assessment and management plan was discussed with the patient. The patient verbalized understanding of and has agreed to the management plan. Patient is aware to call the clinic if symptoms persist or worsen. Patient is aware when to return to the clinic for a follow-up visit. Patient educated on when it is appropriate to go to the emergency department.   Time call ended:  12:12 pm  I provided 15 minutes of non-face-to-face time during this encounter.    [name], FNP

## 2018-09-09 DIAGNOSIS — Z471 Aftercare following joint replacement surgery: Secondary | ICD-10-CM | POA: Diagnosis not present

## 2018-09-09 DIAGNOSIS — M25561 Pain in right knee: Secondary | ICD-10-CM | POA: Diagnosis not present

## 2018-09-09 DIAGNOSIS — Z96652 Presence of left artificial knee joint: Secondary | ICD-10-CM | POA: Diagnosis not present

## 2018-09-23 ENCOUNTER — Ambulatory Visit (INDEPENDENT_AMBULATORY_CARE_PROVIDER_SITE_OTHER): Payer: Medicare Other | Admitting: *Deleted

## 2018-09-23 ENCOUNTER — Other Ambulatory Visit: Payer: Self-pay | Admitting: Family Medicine

## 2018-09-23 ENCOUNTER — Other Ambulatory Visit: Payer: Self-pay | Admitting: Family

## 2018-09-23 ENCOUNTER — Other Ambulatory Visit: Payer: Self-pay

## 2018-09-23 ENCOUNTER — Telehealth: Payer: Self-pay | Admitting: Family

## 2018-09-23 DIAGNOSIS — M545 Low back pain, unspecified: Secondary | ICD-10-CM

## 2018-09-23 DIAGNOSIS — M8949 Other hypertrophic osteoarthropathy, multiple sites: Secondary | ICD-10-CM

## 2018-09-23 DIAGNOSIS — M7541 Impingement syndrome of right shoulder: Secondary | ICD-10-CM

## 2018-09-23 DIAGNOSIS — M159 Polyosteoarthritis, unspecified: Secondary | ICD-10-CM

## 2018-09-23 DIAGNOSIS — M8580 Other specified disorders of bone density and structure, unspecified site: Secondary | ICD-10-CM

## 2018-09-23 DIAGNOSIS — M810 Age-related osteoporosis without current pathological fracture: Secondary | ICD-10-CM | POA: Diagnosis not present

## 2018-09-23 DIAGNOSIS — Z780 Asymptomatic menopausal state: Secondary | ICD-10-CM

## 2018-09-23 DIAGNOSIS — G8929 Other chronic pain: Secondary | ICD-10-CM

## 2018-09-23 DIAGNOSIS — F1120 Opioid dependence, uncomplicated: Secondary | ICD-10-CM

## 2018-09-23 DIAGNOSIS — F411 Generalized anxiety disorder: Secondary | ICD-10-CM

## 2018-09-23 NOTE — Telephone Encounter (Signed)
It looks like this appointment is just for Prolia, I do not know if that is with Zigmund Daniel or who that is with but I think the symptoms need to be discussed with whoever she is going to see and see if they will let her come based off of that.

## 2018-09-23 NOTE — Telephone Encounter (Signed)
Patients daughter called back  [place] for patient to come in!

## 2018-10-06 ENCOUNTER — Ambulatory Visit: Payer: Medicare Other | Admitting: Family

## 2018-10-11 ENCOUNTER — Other Ambulatory Visit: Payer: Self-pay | Admitting: Family

## 2018-10-11 DIAGNOSIS — F411 Generalized anxiety disorder: Secondary | ICD-10-CM

## 2018-10-12 ENCOUNTER — Other Ambulatory Visit: Payer: Self-pay

## 2018-10-13 ENCOUNTER — Other Ambulatory Visit: Payer: Self-pay | Admitting: Family

## 2018-10-13 ENCOUNTER — Ambulatory Visit (INDEPENDENT_AMBULATORY_CARE_PROVIDER_SITE_OTHER): Payer: Medicare Other | Admitting: Family

## 2018-10-13 ENCOUNTER — Encounter: Payer: Self-pay | Admitting: Family

## 2018-10-13 VITALS — BP 128/80 | HR 100 | Temp 97.3°F | Ht 64.0 in | Wt 223.0 lb

## 2018-10-13 DIAGNOSIS — F331 Major depressive disorder, recurrent, moderate: Secondary | ICD-10-CM

## 2018-10-13 DIAGNOSIS — B372 Candidiasis of skin and nail: Secondary | ICD-10-CM

## 2018-10-13 DIAGNOSIS — I10 Essential (primary) hypertension: Secondary | ICD-10-CM | POA: Diagnosis not present

## 2018-10-13 DIAGNOSIS — M545 Low back pain, unspecified: Secondary | ICD-10-CM

## 2018-10-13 DIAGNOSIS — F411 Generalized anxiety disorder: Secondary | ICD-10-CM

## 2018-10-13 DIAGNOSIS — J449 Chronic obstructive pulmonary disease, unspecified: Secondary | ICD-10-CM

## 2018-10-13 DIAGNOSIS — M159 Polyosteoarthritis, unspecified: Secondary | ICD-10-CM

## 2018-10-13 DIAGNOSIS — E039 Hypothyroidism, unspecified: Secondary | ICD-10-CM | POA: Diagnosis not present

## 2018-10-13 DIAGNOSIS — M150 Primary generalized (osteo)arthritis: Secondary | ICD-10-CM

## 2018-10-13 DIAGNOSIS — G629 Polyneuropathy, unspecified: Secondary | ICD-10-CM

## 2018-10-13 DIAGNOSIS — M8949 Other hypertrophic osteoarthropathy, multiple sites: Secondary | ICD-10-CM

## 2018-10-13 DIAGNOSIS — Z0289 Encounter for other administrative examinations: Secondary | ICD-10-CM

## 2018-10-13 DIAGNOSIS — E785 Hyperlipidemia, unspecified: Secondary | ICD-10-CM

## 2018-10-13 DIAGNOSIS — Z720 Tobacco use: Secondary | ICD-10-CM

## 2018-10-13 DIAGNOSIS — G8929 Other chronic pain: Secondary | ICD-10-CM

## 2018-10-13 MED ORDER — GABAPENTIN 300 MG PO CAPS: ORAL_CAPSULE | ORAL | 0 refills | Status: DC

## 2018-10-13 MED ORDER — ALPRAZOLAM 0.5 MG PO TABS: ORAL_TABLET | ORAL | 2 refills | Status: DC

## 2018-10-13 MED ORDER — NYSTATIN 100000 UNIT/GM EX OINT: 1.0000 "application " | TOPICAL_OINTMENT | Freq: Two times a day (BID) | CUTANEOUS | 0 refills | Status: DC

## 2018-10-13 NOTE — Patient Instructions (Signed)
Skin Yeast Infection  A skin yeast infection is a condition in which there is an overgrowth of yeast (candida) that normally lives on the skin. This condition usually occurs in areas of the skin that are constantly warm and moist, such as the armpits or the groin. What are the causes? This condition is caused by a change in the normal balance of the yeast and bacteria that live on the skin. What increases the risk? You are more likely to develop this condition if you:  Are obese.  Are pregnant.  Take birth control pills.  Have diabetes.  Take antibiotic medicines.  Take steroid medicines.  Are malnourished.  Have a weak body defense system (immune system).  Are 42 years of age or older.  Wear tight clothing. What are the signs or symptoms? The most common symptom of this condition is itchiness in the affected area. Other symptoms include:  Red, swollen area of the skin.  Bumps on the skin. How is this diagnosed?  This condition is diagnosed with a medical history and physical exam.  Your health care provider may check for yeast by taking light scrapings of the skin to be viewed under a microscope. How is this treated? This condition is treated with medicine. Medicines may be prescribed or be available over the counter. The medicines may be:  Taken by mouth (orally).  Applied as a cream or powder to your skin. Follow these instructions at home:   Take or apply over-the-counter and prescription medicines only as told by your health care provider.  Maintain a healthy weight. If you need help losing weight, talk with your health care provider.  Keep your skin clean and dry.  If you have diabetes, keep your blood sugar under control.  Keep all follow-up visits as told by your health care provider. This is important. Contact a health care provider if:  Your symptoms go away and then return.  Your symptoms do not get better with treatment.  Your symptoms get  worse.  Your rash spreads.  You have a fever or chills.  You have new symptoms.  You have new warmth or redness of your skin. Summary  A skin yeast infection is a condition in which there is an overgrowth of yeast (candida) that normally lives on the skin. This condition is caused by a change in the normal balance of the yeast and bacteria that live on the skin.  Take or apply over-the-counter and prescription medicines only as told by your health care provider.  Keep your skin clean and dry.  Contact a health care provider if your symptoms do not get better with treatment. This information is not intended to replace advice given to you by your health care provider. Make sure you discuss any questions you have with your health care provider. Document Released: 10/28/2010 Document Revised: 06/29/2017 Document Reviewed: 06/29/2017 Elsevier Patient Education  2020 [employer].

## 2018-10-13 NOTE — Progress Notes (Signed)
Subjective:    Patient ID: Sheila [name], female    DOB: Apr 17, 1940, 78 y.o.   MRN: [id number]  Chief Complaint  Patient presents with  . Medical Management of Chronic Issues    refills  . itching under breast   Pt presents to the office today for chronic follow up.  Depression        This is a chronic problem.  The current episode started more than 1 year ago.   The onset quality is gradual.   The problem occurs intermittently.  The problem has been waxing and waning since onset.  Associated symptoms include fatigue, insomnia, irritable, restlessness, decreased interest and sad.  Associated symptoms include no helplessness and no hopelessness.  Past treatments include SNRIs - Serotonin and norepinephrine reuptake inhibitors.  Compliance with treatment is good.  Past medical history includes thyroid problem and anxiety.   Anxiety Presents for follow-up visit. Symptoms include depressed mood, excessive worry, insomnia, irritability, nervous/anxious behavior, palpitations and restlessness. Symptoms occur occasionally. The severity of symptoms is moderate. The quality of sleep is good.    Thyroid Problem Presents for follow-up visit. Symptoms include anxiety, depressed mood, fatigue and palpitations. The symptoms have been stable. Her past medical history is significant for hyperlipidemia. There is no history of heart failure.  Hypertension This is a chronic problem. The current episode started more than 1 year ago. The problem has been resolved since onset. The problem is controlled. Associated symptoms include anxiety, malaise/fatigue, palpitations and peripheral edema. Risk factors for coronary artery disease include dyslipidemia, diabetes mellitus, obesity and sedentary lifestyle. The current treatment provides moderate improvement. There is no history of kidney disease, CAD/MI, CVA or heart failure. Identifiable causes of hypertension include a thyroid problem.  Hyperlipidemia This is a  chronic problem. The current episode started more than 1 year ago. The problem is controlled. Recent lipid tests were reviewed and are normal. Exacerbating diseases include obesity. Current antihyperlipidemic treatment includes statins.  Gastroesophageal Reflux She complains of belching and heartburn. This is a chronic problem. The current episode started more than 1 year ago. The problem occurs occasionally. Associated symptoms include fatigue. She has tried a PPI for the symptoms. The treatment provided moderate relief.  Arthritis Presents for follow-up visit. She complains of pain and stiffness. The symptoms have been stable. Affected locations include the right knee and left knee. Her pain is at a severity of 1/10. Associated symptoms include fatigue and rash.  Rash This is a recurrent problem. The current episode started more than 1 month ago. The problem has been waxing and waning since onset. Location: under bilateral breast. She was exposed to nothing. Associated symptoms include fatigue. Treatments tried: powder. The treatment provided mild relief.  COPD  Using Symbicort BID and albuterol twice a week. States her breathing is stable.     Review of Systems  Constitutional: Positive for fatigue, irritability and malaise/fatigue.  Cardiovascular: Positive for palpitations.  Gastrointestinal: Positive for heartburn.  Musculoskeletal: Positive for arthritis and stiffness.  Skin: Positive for rash.  Psychiatric/Behavioral: Positive for depression. The patient is nervous/anxious and has insomnia.   All other systems reviewed and are negative.      Objective:   Physical Exam Vitals signs reviewed.  Constitutional:      General: She is irritable. She is not in acute distress.    Appearance: She is well-developed. She is obese.  HENT:     Head: Normocephalic and atraumatic.     Right Ear: Tympanic membrane normal.  Left Ear: Tympanic membrane normal.  Eyes:     Pupils: Pupils are  equal, round, and reactive to light.  Neck:     Musculoskeletal: Normal range of motion and neck supple.     Thyroid: No thyromegaly.  Cardiovascular:     Rate and Rhythm: Normal rate and regular rhythm.     Heart sounds: Normal heart sounds. No murmur.  Pulmonary:     Effort: Pulmonary effort is normal. No respiratory distress.     Breath sounds: Normal breath sounds. No wheezing.  Abdominal:     General: Bowel sounds are normal. There is no distension.     Palpations: Abdomen is soft.     Tenderness: There is no abdominal tenderness.  Musculoskeletal:        General: No tenderness.     Comments: Using rolling walker  Skin:    General: Skin is warm and dry.     Findings: Rash present.     Comments: Erythemas rash under bilateral breast  Neurological:     Mental Status: She is alert and oriented to person, place, and time.     Cranial Nerves: No cranial nerve deficit.     Motor: Weakness present.     Deep Tendon Reflexes: Reflexes are normal and symmetric.  Psychiatric:        Behavior: Behavior normal.        Thought Content: Thought content normal.        Judgment: Judgment normal.       BP 128/80   Pulse 100   Temp (!) 97.3 F (36.3 C) (Temporal)   Ht '5\' 4"'$  (1.626 m)   Wt 223 lb (101.2 kg)   BMI 38.28 kg/m      Assessment & Plan:  Sheila [name] comes in today with chief complaint of Medical Management of Chronic Issues (refills) and itching under breast   Diagnosis and orders addressed:  1. GAD (generalized anxiety disorder) - ALPRAZolam (XANAX) 0.5 MG tablet; TAKE (1) TABLET TWICE A DAY.  Dispense: 60 tablet; Refill: 2 - CMP14+EGFR - CBC with Differential/Platelet  2. Essential hypertension, benign - CMP14+EGFR - CBC with Differential/Platelet  3. Chronic obstructive pulmonary disease, unspecified COPD type ([place]) - CMP14+EGFR - CBC with Differential/Platelet  4. Neuropathy - gabapentin (NEURONTIN) 300 MG capsule; TAKE (1) CAPSULE THREE TIMES DAILY.   Dispense: 270 capsule; Refill: 0 - CMP14+EGFR - CBC with Differential/Platelet  5. Hypothyroidism, unspecified type - CMP14+EGFR - CBC with Differential/Platelet - TSH  6. Primary osteoarthritis involving multiple joints - CMP14+EGFR - CBC with Differential/Platelet  7. Morbid obesity ([place]) - CMP14+EGFR - CBC with Differential/Platelet  8. Pain medication agreement signed - CMP14+EGFR - CBC with Differential/Platelet  9. Smokeless tobacco use - CMP14+EGFR - CBC with Differential/Platelet  10. Hyperlipidemia, unspecified hyperlipidemia type - CMP14+EGFR - CBC with Differential/Platelet  11. Moderate episode of recurrent major depressive disorder (HCC) - CMP14+EGFR - CBC with Differential/Platelet  12. Chronic bilateral low back pain, unspecified whether sciatica present - CMP14+EGFR - CBC with Differential/Platelet  13. Skin yeast infection Keep clean and dry - nystatin ointment (MYCOSTATIN); Apply 1 application topically 2 (two) times daily.  Dispense: 30 g; Refill: 0   Labs pending Pt reviewed in [place] controlled database- No red flags. She has stopped her Ultram.  Health Maintenance reviewed Diet and exercise encouraged  Follow up plan: [place], FNP

## 2018-10-14 ENCOUNTER — Other Ambulatory Visit: Payer: Self-pay | Admitting: Family

## 2018-10-14 DIAGNOSIS — G629 Polyneuropathy, unspecified: Secondary | ICD-10-CM

## 2018-10-14 LAB — CBC WITH DIFFERENTIAL/PLATELET
Basophils Absolute: 0.1 10*3/uL (ref 0.0–0.2)
Basos: 1 %
EOS (ABSOLUTE): 0.3 10*3/uL (ref 0.0–0.4)
Eos: 4 %
Hematocrit: 39.6 % (ref 34.0–46.6)
Hemoglobin: 13 g/dL (ref 11.1–15.9)
Immature Grans (Abs): 0 10*3/uL (ref 0.0–0.1)
Immature Granulocytes: 0 %
Lymphocytes Absolute: 2.8 10*3/uL (ref 0.7–3.1)
Lymphs: 33 %
MCH: 27.4 pg (ref 26.6–33.0)
MCHC: 32.8 g/dL (ref 31.5–35.7)
MCV: 83 fL (ref 79–97)
Monocytes Absolute: 0.6 10*3/uL (ref 0.1–0.9)
Monocytes: 7 %
Neutrophils Absolute: 4.6 10*3/uL (ref 1.4–7.0)
Neutrophils: 55 %
Platelets: 335 10*3/uL (ref 150–450)
RBC: 4.75 x10E6/uL (ref 3.77–5.28)
RDW: 14.4 % (ref 11.7–15.4)
WBC: 8.4 10*3/uL (ref 3.4–10.8)

## 2018-10-14 LAB — CMP14+EGFR
ALT: 53 IU/L — ABNORMAL HIGH (ref 0–32)
AST: 61 IU/L — ABNORMAL HIGH (ref 0–40)
Albumin/Globulin Ratio: 1.6 (ref 1.2–2.2)
Albumin: 4.4 g/dL (ref 3.7–4.7)
Alkaline Phosphatase: 69 IU/L (ref 39–117)
BUN/Creatinine Ratio: 21 (ref 12–28)
BUN: 16 mg/dL (ref 8–27)
Bilirubin Total: 0.5 mg/dL (ref 0.0–1.2)
CO2: 23 mmol/L (ref 20–29)
Calcium: 9.9 mg/dL (ref 8.7–10.3)
Chloride: 101 mmol/L (ref 96–106)
Creatinine, Ser: 0.75 mg/dL (ref 0.57–1.00)
GFR calc Af Amer: 89 mL/min/{1.73_m2} (ref >59)
GFR calc non Af Amer: 77 mL/min/{1.73_m2} (ref >59)
Globulin, Total: 2.8 g/dL (ref 1.5–4.5)
Glucose: 152 mg/dL — ABNORMAL HIGH (ref 65–99)
Potassium: 4.3 mmol/L (ref 3.5–5.2)
Sodium: 141 mmol/L (ref 134–144)
Total Protein: 7.2 g/dL (ref 6.0–8.5)

## 2018-10-14 LAB — TSH: TSH: 3.61 u[IU]/mL (ref 0.450–4.500)

## 2018-10-20 ENCOUNTER — Other Ambulatory Visit: Payer: Self-pay

## 2018-10-20 ENCOUNTER — Telehealth: Payer: Self-pay | Admitting: Family

## 2018-10-20 ENCOUNTER — Encounter: Payer: Self-pay | Admitting: Family Medicine

## 2018-10-20 ENCOUNTER — Ambulatory Visit (INDEPENDENT_AMBULATORY_CARE_PROVIDER_SITE_OTHER): Payer: Medicare Other | Admitting: Family Medicine

## 2018-10-20 DIAGNOSIS — B372 Candidiasis of skin and nail: Secondary | ICD-10-CM

## 2018-10-20 MED ORDER — NYSTATIN 100000 UNIT/GM EX POWD: Freq: Four times a day (QID) | CUTANEOUS | 0 refills | Status: DC

## 2018-10-20 MED ORDER — FLUCONAZOLE 150 MG PO TABS: 150.0000 mg | ORAL_TABLET | Freq: Once | ORAL | 0 refills | Status: AC

## 2018-10-20 NOTE — Telephone Encounter (Signed)
[facility] appt made for today

## 2018-10-20 NOTE — Progress Notes (Signed)
   Virtual Visit via telephone Note  I connected with Sheila [name] on 10/20/18 at 1700 by telephone and verified that I am speaking with the correct person using two identifiers. Sheila [name] is currently located at home and Niece [name] are currently with her during visit. The provider, Fransisca Kaufmann Zofia Peckinpaugh, MD is located in their office at time of visit.  Call ended at 1707  I discussed the limitations, risks, security and privacy concerns of performing an evaluation and management service by telephone and the availability of in person appointments. I also discussed with the patient that there may be a patient responsible charge related to this service. The patient expressed understanding and agreed to proceed.   History and Present Illness: Patient rash under left arm and left breast that itches and that it is red and pink. Patient is using an ointment for yeast that is not working.  She denies any fevers or chills.  The rash just itches real bad.  No diagnosis found.    Review of Systems  Constitutional: Negative for chills and fever.  Eyes: Negative for visual disturbance.  Respiratory: Negative for chest tightness and shortness of breath.   Cardiovascular: Negative for chest pain and leg swelling.  Musculoskeletal: Negative for back pain and gait problem.  Skin: Positive for rash.  Psychiatric/Behavioral: Negative for agitation and behavioral problems.  All other systems reviewed and are negative.   Observations/Objective: Patient sounds comfortable and in no acute distress  Assessment and Plan: Problem List Items Addressed This Visit    None    Visit Diagnoses    Skin yeast infection    -  Primary   Relevant Medications   fluconazole (DIFLUCAN) 150 MG tablet   nystatin (MYCOSTATIN/NYSTOP) powder       Follow Up Instructions: Follow up as needed    I discussed the assessment and treatment plan with the patient. The patient was provided an opportunity to ask  questions and all were answered. The patient agreed with the plan and demonstrated an understanding of the instructions.   The patient was advised to call back or seek an in-person evaluation if the symptoms worsen or if the condition fails to improve as anticipated.  The above assessment and management plan was discussed with the patient. The patient verbalized understanding of and has agreed to the management plan. Patient is aware to call the clinic if symptoms persist or worsen. Patient is aware when to return to the clinic for a follow-up visit. Patient educated on when it is appropriate to go to the emergency department.    I provided 7 minutes of non-face-to-face time during this encounter.    Worthy Rancher, MD

## 2018-10-25 ENCOUNTER — Encounter: Payer: Self-pay | Admitting: Nurse Practitioner

## 2018-10-25 ENCOUNTER — Ambulatory Visit (INDEPENDENT_AMBULATORY_CARE_PROVIDER_SITE_OTHER): Payer: Medicare Other | Admitting: Nurse Practitioner

## 2018-10-25 ENCOUNTER — Other Ambulatory Visit: Payer: Self-pay

## 2018-10-25 DIAGNOSIS — L247 Irritant contact dermatitis due to plants, except food: Secondary | ICD-10-CM | POA: Diagnosis not present

## 2018-10-25 MED ORDER — CLOTRIMAZOLE-BETAMETHASONE 1-0.05 % EX CREA: 1.0000 "application " | TOPICAL_CREAM | Freq: Two times a day (BID) | CUTANEOUS | 0 refills | Status: DC

## 2018-10-25 MED ORDER — HYDROXYZINE HCL 10 MG PO TABS: 10.0000 mg | ORAL_TABLET | Freq: Three times a day (TID) | ORAL | 0 refills | Status: DC | PRN

## 2018-10-25 NOTE — Progress Notes (Signed)
Virtual Visit via telephone Note Due to COVID-19 pandemic this visit was conducted virtually. This visit type was conducted due to national recommendations for restrictions regarding the COVID-19 Pandemic (e.g. social distancing, sheltering in place) in an effort to limit this patient's exposure and mitigate transmission in our community. All issues noted in this document were discussed and addressed.  A physical exam was not performed with this format.  I connected with [name] on 10/25/18 at 3:10 by telephone and verified that I am speaking with the correct person using two identifiers. Sheila [name] is currently located at home and her daughter is currently with her during visit. The provider, Mary-Margaret Hassell Done, FNP is located in their office at time of visit.  I discussed the limitations, risks, security and privacy concerns of performing an evaluation and management service by telephone and the availability of in person appointments. I also discussed with the patient that there may be a patient responsible charge related to this service. The patient expressed understanding and agreed to proceed.   History and Present Illness:  Chief Complaint: Rash   HPI Patient had telephone appointment with DR. Dettinger on 10/20/18. She was dx with cutaneous candidiasis. She was given a diflucan and nystatin powder. She calls  To day for an appointment stating that rash has not improved. Rash is very itchy and has spread over to the other breast   Review of Systems  Constitutional: Negative for diaphoresis and weight loss.  Eyes: Negative for blurred vision, double vision and pain.  Respiratory: Negative for shortness of breath.   Cardiovascular: Negative for chest pain, palpitations, orthopnea and leg swelling.  Gastrointestinal: Negative for abdominal pain.  Skin: Negative for rash.  Neurological: Negative for dizziness, sensory change, loss of consciousness, weakness and headaches.   Endo/Heme/Allergies: Negative for polydipsia. Does not bruise/bleed easily.  Psychiatric/Behavioral: Negative for memory loss. The patient does not have insomnia.   All other systems reviewed and are negative.    Observations/Objective: Alert and oriented- answers all questions appropriately No distress Rash is erythmatous raised macular lesions in linear pattern right axillary and goes down under bith breasts ( family sent pictures to provider from cell phone )   Assessment and Plan: [name] in today with chief complaint of Rash   1. Irritant contact dermatitis due to plants, except food Avoid scratching Cool compresses Hydroxyzine- for itching Meds ordered this encounter  Medications  . hydrOXYzine (ATARAX/VISTARIL) 10 MG tablet    Sig: Take 1 tablet (10 mg total) by mouth 3 (three) times daily as needed.    Dispense:  30 tablet    Refill:  0    Order Specific Question:   Supervising Provider    Answer:   Caryl Pina A [id number]  . clotrimazole-betamethasone (LOTRISONE) cream    Sig: Apply 1 application topically 2 (two) times daily.    Dispense:  30 g    Refill:  0    Order Specific Question:   Supervising Provider    Answer:   Caryl Pina A [id number]    Follow Up Instructions:  if no change by Thursday morning, let me know at office. I discussed the assessment and treatment plan with the patient. The patient was provided an opportunity to ask questions and all were answered. The patient agreed with the plan and demonstrated an understanding of the instructions.   The patient was advised to call back or seek an in-person evaluation if the symptoms worsen or if the  condition fails to improve as anticipated.  The above assessment and management plan was discussed with the patient. The patient verbalized understanding of and has agreed to the management plan. Patient is aware to call the clinic if symptoms persist or worsen. Patient is aware when to return  to the clinic for a follow-up visit. Patient educated on when it is appropriate to go to the emergency department.   Time call ended:  3:30  I provided 20 minutes of non-face-to-face time during this encounter.    Mary-Margaret Hassell Done, FNP

## 2018-11-02 ENCOUNTER — Ambulatory Visit (INDEPENDENT_AMBULATORY_CARE_PROVIDER_SITE_OTHER): Payer: Medicare Other | Admitting: Nurse Practitioner

## 2018-11-02 ENCOUNTER — Encounter: Payer: Self-pay | Admitting: Nurse Practitioner

## 2018-11-02 DIAGNOSIS — N3000 Acute cystitis without hematuria: Secondary | ICD-10-CM

## 2018-11-02 MED ORDER — CEPHALEXIN 500 MG PO CAPS: 500.0000 mg | ORAL_CAPSULE | Freq: Two times a day (BID) | ORAL | 0 refills | Status: DC

## 2018-11-02 NOTE — Progress Notes (Signed)
   Virtual Visit via telephone Note Due to COVID-19 pandemic this visit was conducted virtually. This visit type was conducted due to national recommendations for restrictions regarding the COVID-19 Pandemic (e.g. social distancing, sheltering in place) in an effort to limit this patient's exposure and mitigate transmission in our community. All issues noted in this document were discussed and addressed.  A physical exam was not performed with this format.  I connected with Sheila [name] on 11/02/18 at 11:10 by telephone and verified that I am speaking with the correct person using two identifiers. Sheila [name] is currently located at home and her daughter is currently with her during visit. The provider, Mary-Margaret Hassell Done, FNP is located in their office at time of visit.  I discussed the limitations, risks, security and privacy concerns of performing an evaluation and management service by telephone and the availability of in person appointments. I also discussed with the patient that there may be a patient responsible charge related to this service. The patient expressed understanding and agreed to proceed.   History and Present Illness:   Chief Complaint: Urinary Tract Infection   HPI Patient contact today stating that she has urinary frequency with scant amounts. Feels like she is not emptying her bladder. Urine has slight odor. Started late yesterday evening.   Review of Systems  Constitutional: Negative for diaphoresis and weight loss.  Eyes: Negative for blurred vision, double vision and pain.  Respiratory: Negative for shortness of breath.   Cardiovascular: Negative for chest pain, palpitations, orthopnea and leg swelling.  Gastrointestinal: Negative for abdominal pain.  Genitourinary: Positive for frequency and urgency. Negative for dysuria.  Skin: Negative for rash.  Neurological: Negative for dizziness, sensory change, loss of consciousness, weakness and headaches.   Endo/Heme/Allergies: Negative for polydipsia. Does not bruise/bleed easily.  Psychiatric/Behavioral: Negative for memory loss. The patient does not have insomnia.   All other systems reviewed and are negative.    Observations/Objective: Alert and oriented- answers all questions appropriately No distress    Assessment and Plan: Sheila [name] in today with chief complaint of Urinary Tract Infection   1. Acute cystitis without hematuria Take medication as prescribe Cotton underwear Take shower not bath Cranberry juice, yogurt Force fluids AZO over the counter X2 days RTO prn  - cephALEXin (KEFLEX) 500 MG capsule; Take 1 capsule (500 mg total) by mouth 2 (two) times daily.  Dispense: 14 capsule; Refill: 0   Follow Up Instructions: prn    I discussed the assessment and treatment plan with the patient. The patient was provided an opportunity to ask questions and all were answered. The patient agreed with the plan and demonstrated an understanding of the instructions.   The patient was advised to call back or seek an in-person evaluation if the symptoms worsen or if the condition fails to improve as anticipated.  The above assessment and management plan was discussed with the patient. The patient verbalized understanding of and has agreed to the management plan. Patient is aware to call the clinic if symptoms persist or worsen. Patient is aware when to return to the clinic for a follow-up visit. Patient educated on when it is appropriate to go to the emergency department.   Time call ended:  11:20  I provided 10 minutes of non-face-to-face time during this encounter.    Mary-Margaret Hassell Done, FNP

## 2018-11-10 ENCOUNTER — Other Ambulatory Visit: Payer: Self-pay | Admitting: Family Medicine

## 2018-11-21 ENCOUNTER — Telehealth: Payer: Self-pay | Admitting: Family

## 2018-11-21 DIAGNOSIS — N3941 Urge incontinence: Secondary | ICD-10-CM

## 2018-11-21 NOTE — Telephone Encounter (Signed)
Prescription sent to pharmacy.

## 2018-11-24 DIAGNOSIS — M150 Primary generalized (osteo)arthritis: Secondary | ICD-10-CM | POA: Diagnosis not present

## 2018-12-01 ENCOUNTER — Other Ambulatory Visit: Payer: Self-pay | Admitting: Family

## 2018-12-14 ENCOUNTER — Other Ambulatory Visit: Payer: Self-pay

## 2018-12-14 NOTE — Patient Outreach (Signed)
[facility]) Care Management  12/14/2018  SAMEEHA WICHMANN 1940/09/29 [id number]   Medication Adherence call to Mrs. Valere Dross [facility] Identifiers Verify spoke with patient daughter,patient is past due on Livalo 2 mg,patient explain she was taken off for some time but now patient was put back again patient explain she takes 1 tablet daily and has plenty at this time.Mrs. Tamez is showing past due under [place].   [facility] Direct Dial [phone]  Fax [phone] [email or web address]

## 2018-12-20 ENCOUNTER — Telehealth: Payer: Self-pay | Admitting: Family

## 2018-12-20 ENCOUNTER — Other Ambulatory Visit: Payer: Self-pay | Admitting: Family

## 2018-12-20 DIAGNOSIS — G629 Polyneuropathy, unspecified: Secondary | ICD-10-CM

## 2018-12-20 MED ORDER — GABAPENTIN 300 MG PO CAPS: 300.0000 mg | ORAL_CAPSULE | Freq: Three times a day (TID) | ORAL | 0 refills | Status: DC

## 2018-12-20 NOTE — Telephone Encounter (Signed)
Rx sent in

## 2018-12-25 DIAGNOSIS — M150 Primary generalized (osteo)arthritis: Secondary | ICD-10-CM | POA: Diagnosis not present

## 2018-12-27 ENCOUNTER — Ambulatory Visit (INDEPENDENT_AMBULATORY_CARE_PROVIDER_SITE_OTHER): Payer: Medicare Other | Admitting: *Deleted

## 2018-12-27 DIAGNOSIS — Z0000 Encounter for general adult medical examination without abnormal findings: Secondary | ICD-10-CM

## 2018-12-27 DIAGNOSIS — Z0001 Encounter for general adult medical examination with abnormal findings: Secondary | ICD-10-CM

## 2018-12-27 DIAGNOSIS — H9193 Unspecified hearing loss, bilateral: Secondary | ICD-10-CM

## 2018-12-27 NOTE — Progress Notes (Signed)
MEDICARE ANNUAL WELLNESS VISIT  12/27/2018  Telephone Visit Disclaimer This Medicare AWV was conducted by telephone due to national recommendations for restrictions regarding the COVID-19 Pandemic (e.g. social distancing).  I verified, using two identifiers, that I am speaking with Sheila [name] or their authorized healthcare agent. I discussed the limitations, risks, security, and privacy concerns of performing an evaluation and management service by telephone and the potential availability of an in-person appointment in the future. The patient expressed understanding and agreed to proceed.   Subjective:  Sheila [name] is a 78 y.o. female patient of Hawks, Theador Hawthorne, FNP who had a Medicare Annual Wellness Visit today via telephone. Sylvie is Retired and lives alone.She does have and Aid that comes Monday through Friday to take care of everything for her as well as her daughter that helps on the weekends. she has 2 children. she reports that she is socially active and does interact with friends/family regularly. she is minimally physically active and enjoys watching TV and riding around.  Patient Care Team: Sharion Balloon, FNP as PCP - General (Family Medicine) Rogene Houston, MD as Consulting Physician (Gastroenterology) Burnell Blanks, MD as Consulting Physician (Cardiology)  Advanced Directives 12/27/2018 08/29/2018 03/29/2018 03/08/2018 12/22/2017 08/14/2016 04/16/2016  Does Patient Have a Medical Advance Directive? No No No No Yes Yes Yes  Type of Advance Directive - - - - Press photographer;Living will [place];Living will [place];Living will  Does patient want to make changes to medical advance directive? - - - - - No - Patient declined -  Copy of Lily in Chart? - - - - No - copy requested No - copy requested No - copy requested  Would patient like information on creating a medical advance directive? No -  Patient declined - - - No - Patient declined - -  Pre-existing out of facility DNR order (yellow form or pink MOST form) - - - - - - -    Hospital Utilization Over the Past 12 Months: # of hospitalizations or ER visits: 1 # of surgeries: 0  Review of Systems    Patient reports that her overall health is unchanged compared to last year.  History obtained from chart review  Patient Reported Readings (BP, Pulse, CBG, Weight, etc) none  Pain Assessment Pain : No/denies pain     Current Medications & Allergies (verified) Allergies as of 12/27/2018      Reactions   Ibuprofen    History of bleeding ulcers - contra-indicated   Benadryl [diphenhydramine Hcl] Other (See Comments)   Worsening depression   Lipitor [atorvastatin] Other (See Comments)   Body aches.   Hydrocodone    Nausea and vomiting   Codeine Nausea And Vomiting   Morphine And Related Nausea And Vomiting   Tdap [tetanus-diphth-acell Pertussis] Swelling   Redness and localized swelling at injection site      Medication List       Accurate as of December 27, 2018  3:12 PM. If you have any questions, ask your nurse or doctor.        STOP taking these medications   cephALEXin 500 MG capsule Commonly known as: Keflex     TAKE these medications   albuterol (2.5 MG/3ML) 0.083% nebulizer solution Commonly known as: PROVENTIL NEBULIZE 1 VIAL EVERY 6-8 HOURS AS NEEDED FOR WHEEZING OR SHORTNESS OF BREATH   ALPRAZolam 0.5 MG tablet Commonly known as: XANAX TAKE (  1) TABLET TWICE A DAY.   budesonide-formoterol 80-4.5 MCG/ACT inhaler Commonly known as: Symbicort INHALE 2 PUFFS 2 TIMES A DAY   CENTRUM SILVER ADULT 50+ PO Take 1 tablet by mouth every other day.   clotrimazole-betamethasone cream Commonly known as: LOTRISONE Apply 1 application topically 2 (two) times daily.   diclofenac sodium 1 % Gel Commonly known as: VOLTAREN Apply 2 g topically 4 (four) times daily.   DULoxetine 60 MG capsule Commonly  known as: Cymbalta Take 1 capsule (60 mg total) by mouth daily.   estradiol 0.1 MG/GM vaginal cream Commonly known as: ESTRACE Place 1 Applicatorful vaginally every Monday, Wednesday, and Friday. Reported on 08/07/2015   FISH OIL OMEGA-3 PO Take by mouth.   furosemide 20 MG tablet Commonly known as: LASIX Take 1 tablet (20 mg total) by mouth daily.   gabapentin 300 MG capsule Commonly known as: NEURONTIN Take 1 capsule (300 mg total) by mouth 3 (three) times daily.   hydrOXYzine 10 MG tablet Commonly known as: ATARAX/VISTARIL Take 1 tablet (10 mg total) by mouth 3 (three) times daily as needed.   levothyroxine 88 MCG tablet Commonly known as: Synthroid Take 1 tablet (88 mcg total) by mouth daily.   Livalo 2 MG Tabs Generic drug: Pitavastatin Calcium Take 1 tablet (2 mg total) by mouth daily.   meclizine 25 MG tablet Commonly known as: ANTIVERT TAKE (1) TABLET THREE TIMES DAILY AS NEEDED FOR DIZZINESS.   nystatin ointment Commonly known as: MYCOSTATIN Apply 1 application topically 2 (two) times daily.   nystatin powder Commonly known as: MYCOSTATIN/NYSTOP Apply topically 4 (four) times daily.   ondansetron 4 MG disintegrating tablet Commonly known as: Zofran ODT 4mg  ODT q4 hours prn nausea/vomit   pantoprazole 20 MG tablet Commonly known as: PROTONIX TAKE 1 TABLET DAILY   potassium chloride SA 20 MEQ tablet Commonly known as: KLOR-CON TAKE (1) TABLET TWICE A DAY.   Vitamin D3 25 MCG (1000 UT) Caps Take by mouth.       History (reviewed): Past Medical History:  Diagnosis Date  . Allergy   . Bell's palsy   . 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 Cataract   . Chest pain 07/2009   Myoveiw stress test negative.  . Depression   . Diverticulosis of colon   . DJD (degenerative joint disease) of knee   . Dyspnea    on exertion  . Family history of adverse reaction to anesthesia    sister ha nausea/vomiting  . Gastric ulcer with hemorrhage 01/14/2011  .  GERD (gastroesophageal reflux disease)   . Headache   . History of fractured pelvis   . Hypercholesteremia   . Hypertension   . Hypothyroidism   . UTI (lower urinary tract infection)    Past Surgical History:  Procedure Laterality Date  . APPENDECTOMY    . BREAST BIOPSY Left   . ESOPHAGOGASTRODUODENOSCOPY  01/14/2011   Procedure: ESOPHAGOGASTRODUODENOSCOPY (EGD);  Surgeon: Rogene Houston, MD;  Location: [facility];  Service: Endoscopy;  Laterality: N/A;  . HIP SURGERY     pelvis  . JOINT REPLACEMENT    . KNEE SURGERY  04/2010.   Total left knee replacement  . LAPAROSCOPIC APPENDECTOMY N/A 08/01/2013   Procedure: APPENDECTOMY LAPAROSCOPIC;  Surgeon: Jamesetta So, MD;  Location: [facility] ORS;  Service: General;  Laterality: N/A;   Family History  Problem Relation Age of Onset  . Aneurysm Mother   . Stroke Mother   . Hypertension  Mother   . Cancer Father        lung  . Heart disease Sister   . Hip fracture Sister   . Cancer Brother   . Alzheimer's disease Sister   . Cancer Sister        skin, uterine  . Cancer Brother    Social History   Socioeconomic History  . Marital status: Widowed    Spouse name: Not on file  . Number of children: 2  . Years of education: 0  . Highest education level: Never attended school  Occupational History  . Occupation: retired  Scientific laboratory technician  . Financial resource strain: Somewhat hard  . Food insecurity    Worry: Never true    Inability: Never true  . Transportation needs    Medical: No    Non-medical: No  Tobacco Use  . Smoking status: Former Smoker    Types: Cigarettes    Quit date: 02/24/1968    Years since quitting: 50.8  . Smokeless tobacco: Current User    Types: Snuff  . Tobacco comment: quit yrs and yrs ago when she was very young  Substance and Sexual Activity  . Alcohol use: No  . Drug use: No  . Sexual activity: Not Currently    Birth control/protection: Post-menopausal  Lifestyle  . Physical activity    Days per  week: 5 days    Minutes per session: 20 min  . Stress: Not at all  Relationships  . Social connections    Talks on phone: More than three times a week    Gets together: More than three times a week    Attends religious service: Never    Active member of club or organization: No    Attends meetings of clubs or organizations: Never    Relationship status: Widowed  Other Topics Concern  . Not on file  Social History Narrative  . Not on file    Activities of Daily Living In your present state of health, do you have any difficulty performing the following activities: 12/27/2018  Hearing? Y  Comment has noticed some difficulty hearing-referral placed for audiology  Vision? N  Comment wears glasses-gets yearly eye exam  Difficulty concentrating or making decisions? Y  Comment has trouble remembering to take her medications and names  Walking or climbing stairs? Y  Comment she uses a walker and has knee pain  Dressing or bathing? Y  Comment her aid helps with bathing and dressing  Doing errands, shopping? Y  Comment her aid or family member takes her to all appointments and to run all errands  Preparing Food and eating ? Y  Comment most of her meals are prepared for her but she can re-heat meals as well as feed herself  Using the Toilet? Y  Comment at times she needs help cleaning herself after a BM  In the past six months, have you accidently leaked urine? Y  Comment she wears pads/depends all the time  Do you have problems with loss of bowel control? N  Managing your Medications? Y  Comment her family puts her medications in a pill box for her  Managing your Finances? Y  Comment daughter takes care of all the finances  Housekeeping or managing your Housekeeping? Y  Comment family or aid does the housekeeping  Some recent data might be hidden    Patient Education/ Literacy How often do you need to have someone help you when you read instructions, pamphlets, or other written  materials from your doctor or pharmacy?: 5 - Always What is the last grade level you completed in school?: Didn't go to school  Exercise Current Exercise Habits: Home exercise routine, Type of exercise: walking, Time (Minutes): 20, Frequency (Times/Week): 5, Weekly Exercise (Minutes/Week): 100, Intensity: Mild, Exercise limited by: orthopedic condition(s);respiratory conditions(s)  Diet Patient reports consuming 2 meals a day and 2 snack(s) a day Patient reports that her primary diet is: Regular Patient reports that she does have regular access to food.   Depression Screen PHQ 2/9 Scores 12/27/2018 10/13/2018 05/05/2018 05/03/2018 04/05/2018 02/24/2018 12/22/2017  PHQ - 2 Score 0 0 4 4 0 0 0  PHQ- 9 Score - - 14 14 - - -     Fall Risk Fall Risk  12/27/2018 10/13/2018 05/03/2018 04/05/2018 02/24/2018  Falls in the past year? 0 0 0 0 0  Number falls in past yr: - - - - -  Injury with Fall? - - - - -  Comment - - - - -     Objective:  Sheila [name] seemed alert and oriented and she participated appropriately during our telephone visit.  Blood Pressure Weight BMI  BP Readings from Last 3 Encounters:  10/13/18 128/80  08/29/18 112/77  05/03/18 133/84   Wt Readings from Last 3 Encounters:  10/13/18 223 lb (101.2 kg)  08/29/18 221 lb (100.2 kg)  05/03/18 212 lb 12.8 oz (96.5 kg)   BMI Readings from Last 1 Encounters:  10/13/18 38.28 kg/m    *Unable to obtain current vital signs, weight, and BMI due to telephone visit type  Hearing/Vision  . Deatrice did not seem to have difficulty with hearing/understanding during the telephone conversation . Reports that she has not had a formal eye exam by an eye care professional within the past year . Reports that she has not had a formal hearing evaluation within the past year *Unable to fully assess hearing and vision during telephone visit type  Cognitive Function: 6CIT Screen 12/27/2018 12/22/2017 12/22/2017  What Year? 0 points 4 points 0 points   What month? 3 points 3 points 0 points  What time? 0 points - 0 points  Count back from 20 4 points 4 points 0 points  Months in reverse 4 points 4 points 0 points  Repeat phrase 8 points - 2 points  Total Score 19 - 2   (Normal:0-7, Significant for Dysfunction: >8)  Normal Cognitive Function Screening: No: this was expected as she never attended school so she never learned all the months of the year but pt and caregiver does have some concerns about decline in her memory. Offered a follow up appointment for this but pt declined at this time.   Immunization & Health Maintenance Record Immunization History  Administered Date(s) Administered  . Influenza, High Dose Seasonal PF 02/10/2016, 12/15/2016, 12/09/2017  . Influenza,inj,Quad PF,6+ Mos 11/15/2012, 12/11/2013, 01/07/2015  . Pneumococcal Conjugate-13 07/06/2014  . Pneumococcal Polysaccharide-23 12/12/2007, 12/15/2016  . Tdap 02/23/2006    Health Maintenance  Topic Date Due  . INFLUENZA VACCINE  07/14/2019 (Originally 09/24/2018)  . DEXA SCAN  12/23/2019  . MAMMOGRAM  01/05/2020  . PNA vac Low Risk Adult  Completed       Assessment  This is a routine wellness examination for [employer].  Health Maintenance: Due or Overdue There are no preventive care reminders to display for this patient.  Sheila [name] does not need a referral for Community Assistance: Care Management:   no Social Work:  no Prescription Assistance:  no Nutrition/Diabetes Education:  no   Plan:  Personalized Goals Goals Addressed            This Visit's Progress   . DIET - INCREASE WATER INTAKE       Try to drink 6-8 glasses of water daily      Personalized Health Maintenance & Screening Recommendations  Influenza vaccine Shingles vaccine  Lung Cancer Screening Recommended: no (Low Dose CT Chest recommended if Age 14-80 years, 30 pack-year currently smoking OR have quit w/in past 15 years) Hepatitis C Screening recommended: no HIV  Screening recommended: no  Advanced Directives: Written information was not prepared per patient's request.  Referrals & Orders No orders of the defined types were placed in this encounter.   Follow-up Plan . Follow-up with Sharion Balloon, FNP as planned . Audiology referral placed per pt request . Consider Flu and Shingles vaccines at your next visit with your PCP   I have personally reviewed and noted the following in the patient's chart:   . Medical and social history . Use of alcohol, tobacco or illicit drugs  . Current medications and supplements . Functional ability and status . Nutritional status . Physical activity . Advanced directives . List of other physicians . Hospitalizations, surgeries, and ER visits in previous 12 months . Vitals . Screenings to include cognitive, depression, and falls . Referrals and appointments  In addition, I have reviewed and discussed with Sheila [name] certain preventive protocols, quality metrics, and best practice recommendations. A written personalized care plan for preventive services as well as general preventive health recommendations is available and can be mailed to the patient at her request.      Milas Hock, LPN  [id number]

## 2018-12-27 NOTE — Patient Instructions (Signed)
Preventive Care 77 Years and Older, Female Preventive care refers to lifestyle choices and visits with your health care provider that can promote health and wellness. This includes:  A yearly physical exam. This is also called an annual well check.  Regular dental and eye exams.  Immunizations.  Screening for certain conditions.  Healthy lifestyle choices, such as diet and exercise. What can I expect for my preventive care visit? Physical exam Your health care provider will check:  Height and weight. These may be used to calculate body mass index (BMI), which is a measurement that tells if you are at a healthy weight.  Heart rate and blood pressure.  Your skin for abnormal spots. Counseling Your health care provider may ask you questions about:  Alcohol, tobacco, and drug use.  Emotional well-being.  Home and relationship well-being.  Sexual activity.  Eating habits.  History of falls.  Memory and ability to understand (cognition).  Work and work Statistician.  Pregnancy and menstrual history. What immunizations do I need?  Influenza (flu) vaccine  This is recommended every year. Tetanus, diphtheria, and pertussis (Tdap) vaccine  You may need a Td booster every 10 years. Varicella (chickenpox) vaccine  You may need this vaccine if you have not already been vaccinated. Zoster (shingles) vaccine  You may need this after age 33. Pneumococcal conjugate (PCV13) vaccine  One dose is recommended after age 33. Pneumococcal polysaccharide (PPSV23) vaccine  One dose is recommended after age 72. Measles, mumps, and rubella (MMR) vaccine  You may need at least one dose of MMR if you were born in 1957 or later. You may also need a second dose. Meningococcal conjugate (MenACWY) vaccine  You may need this if you have certain conditions. Hepatitis A vaccine  You may need this if you have certain conditions or if you travel or work in places where you may be exposed  to hepatitis A. Hepatitis B vaccine  You may need this if you have certain conditions or if you travel or work in places where you may be exposed to hepatitis B. Haemophilus influenzae type b (Hib) vaccine  You may need this if you have certain conditions. You may receive vaccines as individual doses or as more than one vaccine together in one shot (combination vaccines). Talk with your health care provider about the risks and benefits of combination vaccines. What tests do I need? Blood tests  Lipid and cholesterol levels. These may be checked every 5 years, or more frequently depending on your overall health.  Hepatitis C test.  Hepatitis B test. Screening  Lung cancer screening. You may have this screening every year starting at age 39 if you have a 30-pack-year history of smoking and currently smoke or have quit within the past 15 years.  Colorectal cancer screening. All adults should have this screening starting at age 36 and continuing until age 15. Your health care provider may recommend screening at age 23 if you are at increased risk. You will have tests every 1-10 years, depending on your results and the type of screening test.  Diabetes screening. This is done by checking your blood sugar (glucose) after you have not eaten for a while (fasting). You may have this done every 1-3 years.  Mammogram. This may be done every 1-2 years. Talk with your health care provider about how often you should have regular mammograms.  BRCA-related cancer screening. This may be done if you have a family history of breast, ovarian, tubal, or peritoneal cancers.  Other tests  Sexually transmitted disease (STD) testing.  Bone density scan. This is done to screen for osteoporosis. You may have this done starting at age 76. Follow these instructions at home: Eating and drinking  Eat a diet that includes fresh fruits and vegetables, whole grains, lean protein, and low-fat dairy products. Limit  your intake of foods with high amounts of sugar, saturated fats, and salt.  Take vitamin and mineral supplements as recommended by your health care provider.  Do not drink alcohol if your health care provider tells you not to drink.  If you drink alcohol: ? Limit how much you have to 0-1 drink a day. ? Be aware of how much alcohol is in your drink. In the [place] one drink equals one 12 oz bottle of beer (355 mL), one 5 oz glass of wine (148 mL), or one 1 oz glass of hard liquor (44 mL). Lifestyle  Take daily care of your teeth and gums.  Stay active. Exercise for at least 30 minutes on 5 or more days each week.  Do not use any products that contain nicotine or tobacco, such as cigarettes, e-cigarettes, and chewing tobacco. If you need help quitting, ask your health care provider.  If you are sexually active, practice safe sex. Use a condom or other form of protection in order to prevent STIs (sexually transmitted infections).  Talk with your health care provider about taking a low-dose aspirin or statin. What's next?  Go to your health care provider once a year for a well check visit.  Ask your health care provider how often you should have your eyes and teeth checked.  Stay up to date on all vaccines. This information is not intended to replace advice given to you by your health care provider. Make sure you discuss any questions you have with your health care provider. Document Released: 03/08/2015 Document Revised: 02/03/2018 Document Reviewed: 02/03/2018 Elsevier Patient Education  2020 [employer].

## 2019-01-03 ENCOUNTER — Telehealth: Payer: Self-pay | Admitting: Family

## 2019-01-03 NOTE — Chronic Care Management (AMB) (Signed)
°  Chronic Care Management   Outreach Note  01/03/2019 Name: AZENET DONATI MRN: [id number] DOB: 1940/09/12  Referred by: Sharion Balloon, FNP Reason for referral : Chronic Care Management (Initial CCM outreach was unsucccessful. )   An unsuccessful telephone outreach was attempted today. The patient was referred to the case management team by for assistance with care management and care coordination.   Follow Up Plan: A HIPPA compliant phone message was left for the patient providing contact information and requesting a return call.  The care management team will reach out to the patient again over the next 7 days.  If patient returns call to provider office, please advise to call [facility] [place], [facility]  [place], [place] [address] Direct Dial: [email or web address]  Website: [email or web address]

## 2019-01-05 ENCOUNTER — Other Ambulatory Visit: Payer: Self-pay | Admitting: Family

## 2019-01-05 DIAGNOSIS — F411 Generalized anxiety disorder: Secondary | ICD-10-CM

## 2019-01-06 ENCOUNTER — Ambulatory Visit: Payer: Medicare Other

## 2019-01-09 NOTE — Chronic Care Management (AMB) (Signed)
°  Chronic Care Management   Outreach Note  01/09/2019 Name: Sheila [name] MRN: [id number] DOB: [birth year]  Referred by: Sharion Balloon, FNP Reason for referral : Chronic Care Management (Initial CCM outreach was unsucccessful. ) and Chronic Care Management (Second CCM outreach was unsuccessful. )   A second unsuccessful telephone outreach was attempted today. The patient was referred to the case management team for assistance with care management and care coordination.   Follow Up Plan: A HIPPA compliant phone message was left for the patient providing contact information and requesting a return call.  The care management team will reach out to the patient again over the next 7 days.  If patient returns call to provider office, please advise to call [facility] [place], [facility]  [place], [place] [address] Direct Dial: [email or web address]  Website: [email or web address]

## 2019-01-10 ENCOUNTER — Encounter: Payer: Self-pay | Admitting: Family

## 2019-01-10 ENCOUNTER — Ambulatory Visit (INDEPENDENT_AMBULATORY_CARE_PROVIDER_SITE_OTHER): Payer: Medicare Other | Admitting: Family

## 2019-01-10 DIAGNOSIS — Z79899 Other long term (current) drug therapy: Secondary | ICD-10-CM

## 2019-01-10 DIAGNOSIS — H1013 Acute atopic conjunctivitis, bilateral: Secondary | ICD-10-CM | POA: Diagnosis not present

## 2019-01-10 DIAGNOSIS — F1320 Sedative, hypnotic or anxiolytic dependence, uncomplicated: Secondary | ICD-10-CM | POA: Diagnosis not present

## 2019-01-10 DIAGNOSIS — F411 Generalized anxiety disorder: Secondary | ICD-10-CM

## 2019-01-10 MED ORDER — HYDROXYZINE HCL 10 MG PO TABS: 10.0000 mg | ORAL_TABLET | Freq: Three times a day (TID) | ORAL | 0 refills | Status: DC | PRN

## 2019-01-10 MED ORDER — ALPRAZOLAM 0.5 MG PO TABS: ORAL_TABLET | ORAL | 2 refills | Status: DC

## 2019-01-10 MED ORDER — OLOPATADINE HCL 0.2 % OP SOLN: 2.0000 [drp] | Freq: Every day | OPHTHALMIC | 1 refills | Status: DC

## 2019-01-10 NOTE — Progress Notes (Signed)
Virtual Visit via telephone Note Due to COVID-19 pandemic this visit was conducted virtually. This visit type was conducted due to national recommendations for restrictions regarding the COVID-19 Pandemic (e.g. social distancing, sheltering in place) in an effort to limit this patient's exposure and mitigate transmission in our community. All issues noted in this document were discussed and addressed.  A physical exam was not performed with this format.  I connected with Sheila [name] on 01/10/19 at 10:45 AM by telephone and verified that I am speaking with the correct person using two identifiers. Sheila [name] is currently located at home and niece  is currently with her during visit. The provider, [name], FNP is located in their office at time of visit.  I discussed the limitations, risks, security and privacy concerns of performing an evaluation and management service by telephone and the availability of in person appointments. I also discussed with the patient that there may be a patient responsible charge related to this service. The patient expressed understanding and agreed to proceed.   History and Present Illness:  Anxiety Presents for follow-up visit. Symptoms include decreased concentration, depressed mood, excessive worry, insomnia, irritability, nervous/anxious behavior and restlessness. Symptoms occur most days. The severity of symptoms is moderate.    Conjunctivitis  The current episode started more than 1 week ago. The onset was sudden. The problem occurs occasionally. Associated symptoms include eye itching and eye discharge. Pertinent negatives include no decreased vision, no double vision, no photophobia, no ear discharge, no ear pain, no headaches, no rhinorrhea, no sore throat and no eye redness.      Review of Systems  Constitutional: Positive for irritability.  HENT: Negative for ear discharge, ear pain, rhinorrhea and sore throat.   Eyes: Positive for  discharge and itching. Negative for double vision, photophobia and redness.  Neurological: Negative for headaches.  Psychiatric/Behavioral: Positive for decreased concentration. The patient is nervous/anxious and has insomnia.   All other systems reviewed and are negative.    Observations/Objective: No SOB or distress noted  Assessment and Plan: 1. GAD (generalized anxiety disorder) PT reviewed in [place] controlled database- No Red flags noted Stress management discussed Discussed using Vistaril instead of Xanax when possible. We will hopefully be able to transition off of the xanax - hydrOXYzine (ATARAX/VISTARIL) 10 MG tablet; Take 1 tablet (10 mg total) by mouth 3 (three) times daily as needed.  Dispense: 30 tablet; Refill: 0 - ALPRAZolam (XANAX) 0.5 MG tablet; TAKE (1) TABLET TWICE A DAY.  Dispense: 60 tablet; Refill: 2  2. Benzodiazepine dependence (HCC) - hydrOXYzine (ATARAX/VISTARIL) 10 MG tablet; Take 1 tablet (10 mg total) by mouth 3 (three) times daily as needed.  Dispense: 30 tablet; Refill: 0 - ALPRAZolam (XANAX) 0.5 MG tablet; TAKE (1) TABLET TWICE A DAY.  Dispense: 60 tablet; Refill: 2  3. Controlled substance agreement signed - hydrOXYzine (ATARAX/VISTARIL) 10 MG tablet; Take 1 tablet (10 mg total) by mouth 3 (three) times daily as needed.  Dispense: 30 tablet; Refill: 0 - ALPRAZolam (XANAX) 0.5 MG tablet; TAKE (1) TABLET TWICE A DAY.  Dispense: 60 tablet; Refill: 2  4. Allergic conjunctivitis of both eyes Avoid allergens  Do not rub eyes - Olopatadine HCl 0.2 % SOLN; Apply 2 drops to eye daily.  Dispense: 10 mL; Refill: 1     I discussed the assessment and treatment plan with the patient. The patient was provided an opportunity to ask questions and all were answered. The patient agreed with the plan  and demonstrated an understanding of the instructions.   The patient was advised to call back or seek an in-person evaluation if the symptoms worsen or if the condition fails  to improve as anticipated.  The above assessment and management plan was discussed with the patient. The patient verbalized understanding of and has agreed to the management plan. Patient is aware to call the clinic if symptoms persist or worsen. Patient is aware when to return to the clinic for a follow-up visit. Patient educated on when it is appropriate to go to the emergency department.   Time call ended:  11:02 AM   I provided 17 minutes of non-face-to-face time during this encounter.    [name], FNP

## 2019-01-11 ENCOUNTER — Other Ambulatory Visit: Payer: Self-pay | Admitting: Family

## 2019-01-13 ENCOUNTER — Other Ambulatory Visit: Payer: Self-pay | Admitting: Family

## 2019-01-27 ENCOUNTER — Other Ambulatory Visit: Payer: Self-pay | Admitting: Family Medicine

## 2019-01-31 NOTE — Chronic Care Management (AMB) (Signed)
Chronic Care Management   Note  01/31/2019 Name: Sheila [name] MRN: [id number] DOB: [birth year]  [name] is a 78 y.o. year old female who is a primary care patient of Sharion Balloon, FNP. I reached out to [name] by phone today in response to a referral sent by Ms. Cassandria Anger Spoerl's health plan.     [name] was given information about Chronic Care Management services today including:  1. CCM service includes personalized support from designated clinical staff supervised by her physician, including individualized plan of care and coordination with other care providers 2. 24/7 contact phone numbers for assistance for urgent and routine care needs. 3. Service will only be billed when office clinical staff spend 20 minutes or more in a month to coordinate care. 4. Only one practitioner may furnish and bill the service in a calendar month. 5. The patient may stop CCM services at any time (effective at the end of the month) by phone call to the office staff. 6. The patient will be responsible for cost sharing (co-pay) of up to 20% of the service fee (after annual deductible is met).  Patient agreed to services and verbal consent obtained.   Follow up plan: Telephone appointment with CCM team member scheduled for: 02/22/2019  [facility]  [place], [place] [address] Direct Dial: [email or web address]  Website: [email or web address]

## 2019-02-06 ENCOUNTER — Encounter: Payer: Self-pay | Admitting: Family Medicine

## 2019-02-06 ENCOUNTER — Ambulatory Visit (INDEPENDENT_AMBULATORY_CARE_PROVIDER_SITE_OTHER): Payer: Medicare Other | Admitting: Family Medicine

## 2019-02-06 DIAGNOSIS — L299 Pruritus, unspecified: Secondary | ICD-10-CM

## 2019-02-06 MED ORDER — LEVOCETIRIZINE DIHYDROCHLORIDE 5 MG PO TABS: 5.0000 mg | ORAL_TABLET | Freq: Every evening | ORAL | 0 refills | Status: DC

## 2019-02-06 MED ORDER — TRIAMCINOLONE ACETONIDE 0.5 % EX OINT: 1.0000 "application " | TOPICAL_OINTMENT | Freq: Two times a day (BID) | CUTANEOUS | 0 refills | Status: DC

## 2019-02-06 NOTE — Progress Notes (Signed)
Virtual Visit via Telephone Note  I connected with Sheila [name] on 02/06/19 at 1:29 AM by telephone and verified that I am speaking with the correct person using two identifiers. Sheila [name] is currently located at home and nobody is currently with her during this visit. The provider, Loman Brooklyn, FNP is located in their home at time of visit.  I discussed the limitations, risks, security and privacy concerns of performing an evaluation and management service by telephone and the availability of in person appointments. I also discussed with the patient that there may be a patient responsible charge related to this service. The patient expressed understanding and agreed to proceed.  Subjective: PCP: Sharion Balloon, FNP  Chief Complaint  Patient presents with  . Itching   Patient repots itching from her knees down to her ankles for the past week.  She states that nothing helps; she has tried medicated antiitch spray, sarna, benadryl cream, calamine lotion clear, and ivarest.  There is no rash.  Patient does have a history of extremely dry and flaky skin.  She did send a picture via MyChart and I do not see any apparent rash.  She does use Free and clear laundry detergent.  There has been no change in detergents, soaps, shampoo, lotion, or new clothing.   ROS: Per HPI  Current Outpatient Medications:  .  albuterol (PROVENTIL) (2.5 MG/3ML) 0.083% nebulizer solution, NEBULIZE 1 VIAL EVERY 6-8 HOURS AS NEEDED FOR WHEEZING OR SHORTNESS OF BREATH, Disp: 180 mL, Rfl: 2 .  ALPRAZolam (XANAX) 0.5 MG tablet, TAKE (1) TABLET TWICE A DAY., Disp: 60 tablet, Rfl: 2 .  budesonide-formoterol (SYMBICORT) 80-4.5 MCG/ACT inhaler, INHALE 2 PUFFS 2 TIMES A DAY, Disp: 10.2 g, Rfl: 0 .  Cholecalciferol (VITAMIN D3) 25 MCG (1000 UT) CAPS, Take by mouth., Disp: , Rfl:  .  diclofenac sodium (VOLTAREN) 1 % GEL, Apply 2 g topically 4 (four) times daily., Disp: 100 g, Rfl: 0 .  DULoxetine (CYMBALTA) 60 MG  capsule, Take 1 capsule (60 mg total) by mouth daily., Disp: 90 capsule, Rfl: 1 .  furosemide (LASIX) 20 MG tablet, TAKE 1 TABLET EVERY DAY, Disp: 90 tablet, Rfl: 0 .  gabapentin (NEURONTIN) 300 MG capsule, Take 1 capsule (300 mg total) by mouth 3 (three) times daily., Disp: 270 capsule, Rfl: 0 .  hydrOXYzine (ATARAX/VISTARIL) 10 MG tablet, Take 1 tablet (10 mg total) by mouth 3 (three) times daily as needed., Disp: 30 tablet, Rfl: 0 .  levothyroxine (SYNTHROID) 88 MCG tablet, Take 1 tablet (88 mcg total) by mouth daily., Disp: 30 tablet, Rfl: 11 .  LIVALO 2 MG TABS, Take 1 tablet (2 mg total) by mouth daily., Disp: 90 tablet, Rfl: 0 .  meclizine (ANTIVERT) 25 MG tablet, TAKE (1) TABLET THREE TIMES DAILY AS NEEDED FOR DIZZINESS., Disp: 60 tablet, Rfl: 0 .  Multiple Vitamins-Minerals (CENTRUM SILVER ADULT 50+ PO), Take 1 tablet by mouth every other day., Disp: , Rfl:  .  Olopatadine HCl 0.2 % SOLN, Apply 2 drops to eye daily., Disp: 10 mL, Rfl: 1 .  Omega-3 Fatty Acids (FISH OIL OMEGA-3 PO), Take by mouth., Disp: , Rfl:  .  pantoprazole (PROTONIX) 20 MG tablet, TAKE 1 TABLET DAILY, Disp: 30 tablet, Rfl: 11 .  potassium chloride SA (K-DUR) 20 MEQ tablet, TAKE (1) TABLET TWICE A DAY., Disp: 180 tablet, Rfl: 0 .  traMADol (ULTRAM) 50 MG tablet, TAKE 2 TABLETS EVERY 12 HOURS AS NEEDED, Disp: 120 tablet,  Rfl: 2  Current Facility-Administered Medications:  .  denosumab (PROLIA) injection 60 mg, 60 mg, Subcutaneous, Q6 months, Hawks, Christy A, FNP, 60 mg at 09/23/18 1433  Allergies  Allergen Reactions  . Ibuprofen     History of bleeding ulcers - contra-indicated   . Benadryl [Diphenhydramine Hcl] Other (See Comments)    Worsening depression   . Lipitor [Atorvastatin] Other (See Comments)    Body aches.  . Hydrocodone     Nausea and vomiting  . Codeine Nausea And Vomiting  . Morphine And Related Nausea And Vomiting  . Tdap [Tetanus-Diphth-Acell Pertussis] Swelling    Redness and localized  swelling at injection site    Past Medical History:  Diagnosis Date  . Allergy   . Bell's palsy   . Cancer ([place])    skin CA on face  . Cataract   . Chest pain 07/2009   Myoveiw stress test negative.  . Depression   . Diverticulosis of colon   . DJD (degenerative joint disease) of knee   . Dyspnea    on exertion  . Family history of adverse reaction to anesthesia    sister ha nausea/vomiting  . Gastric ulcer with hemorrhage 01/14/2011  . GERD (gastroesophageal reflux disease)   . Headache   . History of fractured pelvis   . Hypercholesteremia   . Hypertension   . Hypothyroidism   . UTI (lower urinary tract infection)     Observations/Objective: A&O  No respiratory distress or wheezing audible over the phone Mood, judgement, and thought processes all WNL   Assessment and Plan: 1. Itching - Encouraged patient to apply good lotion such as Eucerin, Cetaphil, CeraVe, or Aquaphor twice daily as well as take an antihistamine daily.  Triamcinolone ointment sent to use as needed. - triamcinolone ointment (KENALOG) 0.5 %; Apply 1 application topically 2 (two) times daily.  Dispense: 30 g; Refill: 0 - levocetirizine (XYZAL) 5 MG tablet; Take 1 tablet (5 mg total) by mouth every evening.  Dispense: 30 tablet; Refill: 0   Follow Up Instructions:  I discussed the assessment and treatment plan with the patient. The patient was provided an opportunity to ask questions and all were answered. The patient agreed with the plan and demonstrated an understanding of the instructions.   The patient was advised to call back or seek an in-person evaluation if the symptoms worsen or if the condition fails to improve as anticipated.  The above assessment and management plan was discussed with the patient. The patient verbalized understanding of and has agreed to the management plan. Patient is aware to call the clinic if symptoms persist or worsen. Patient is aware when to return to the clinic for  a follow-up visit. Patient educated on when it is appropriate to go to the emergency department.   Time call ended: 1:40 AM  I provided 15 minutes of non-face-to-face time during this encounter.  Hendricks Limes, MSN, APRN, FNP-C [facility] 02/06/19

## 2019-02-07 ENCOUNTER — Encounter: Payer: Self-pay | Admitting: Family Medicine

## 2019-02-07 ENCOUNTER — Other Ambulatory Visit: Payer: Self-pay

## 2019-02-07 ENCOUNTER — Other Ambulatory Visit: Payer: Self-pay | Admitting: Family

## 2019-02-07 ENCOUNTER — Ambulatory Visit (INDEPENDENT_AMBULATORY_CARE_PROVIDER_SITE_OTHER): Payer: Medicare Other | Admitting: Family Medicine

## 2019-02-07 DIAGNOSIS — L03115 Cellulitis of right lower limb: Secondary | ICD-10-CM

## 2019-02-07 MED ORDER — AMOXICILLIN-POT CLAVULANATE 875-125 MG PO TABS: 1.0000 | ORAL_TABLET | Freq: Two times a day (BID) | ORAL | 0 refills | Status: DC

## 2019-02-07 NOTE — Progress Notes (Signed)
Subjective:    Patient ID: Sheila [name], female    DOB: 1940/12/27, 78 y.o.   MRN: [id number]   HPI: Sheila [name] is a 78 y.o. female presenting for pain in toes, spreading. Right foot at toes dorsally. Onset this AM. Aching sensation that is moderately severe. Toes are swollen. Toes feel hot too  Has had an itch in her legs and ankles. Better with allergy med and lotion.    Depression screen [facility] 2/9 12/27/2018 10/13/2018 05/05/2018 05/03/2018 04/05/2018  Decreased Interest 0 0 2 2 0  Down, Depressed, Hopeless 0 0 2 2 0  PHQ - 2 Score 0 0 4 4 0  Altered sleeping - - 3 3 -  Tired, decreased energy - - 2 3 -  Change in appetite - - 1 3 -  Feeling bad or failure about yourself  - - 2 1 -  Trouble concentrating - - 2 0 -  Moving slowly or fidgety/restless - - 0 0 -  Suicidal thoughts - - 0 0 -  PHQ-9 Score - - 14 14 -  Difficult doing work/chores - - Somewhat difficult - -  Some recent data might be hidden     Relevant past medical, surgical, family and social history reviewed and updated as indicated.  Interim medical history since our last visit reviewed. Allergies and medications reviewed and updated.  ROS:  Review of Systems  Constitutional: Negative for fever.  Respiratory: Negative for chest tightness and shortness of breath.   Cardiovascular: Negative for chest pain and leg swelling.  Gastrointestinal: Negative for abdominal pain.     Social History   Tobacco Use  Smoking Status Former Smoker  . Types: Cigarettes  . Quit date: 02/24/1968  . Years since quitting: 50.9  Smokeless Tobacco Current User  . Types: Snuff  Tobacco Comment   quit yrs and yrs ago when she was very young       Objective:     Wt Readings from Last 3 Encounters:  10/13/18 223 lb (101.2 kg)  08/29/18 221 lb (100.2 kg)  05/03/18 212 lb 12.8 oz (96.5 kg)     Exam deferred. Pt. Harboring due to COVID 19. Phone visit performed.   Multiple  Photos sent via  MyChart:           Assessment & Plan:   1. Cellulitis of right lower extremity     Meds ordered this encounter  Medications  . amoxicillin-clavulanate (AUGMENTIN) 875-125 MG tablet    Sig: Take 1 tablet by mouth 2 (two) times daily. Take all of this medication    Dispense:  20 tablet    Refill:  0    No orders of the defined types were placed in this encounter.     Diagnoses and all orders for this visit:  Cellulitis of right lower extremity  Other orders -     amoxicillin-clavulanate (AUGMENTIN) 875-125 MG tablet; Take 1 tablet by mouth 2 (two) times daily. Take all of this medication    Virtual Visit via telephone Note  I discussed the limitations, risks, security and privacy concerns of performing an evaluation and management service by telephone and the availability of in person appointments. The patient was identified with two identifiers. Pt.expressed understanding and agreed to proceed. Pt. Is at home. Dr. Livia Snellen is in his office.  Follow Up Instructions:   I discussed the assessment and treatment plan with the patient. The patient was provided an opportunity to ask questions and  all were answered. The patient agreed with the plan and demonstrated an understanding of the instructions.   The patient was advised to call back or seek an in-person evaluation if the symptoms worsen or if the condition fails to improve as anticipated.   Total minutes including chart review and phone contact time: 26   Follow up plan: Return if symptoms worsen or fail to improve.  Claretta Fraise, MD [place]

## 2019-02-22 ENCOUNTER — Ambulatory Visit (INDEPENDENT_AMBULATORY_CARE_PROVIDER_SITE_OTHER): Payer: Medicare Other | Admitting: Licensed Clinical Social Worker

## 2019-02-22 DIAGNOSIS — M8949 Other hypertrophic osteoarthropathy, multiple sites: Secondary | ICD-10-CM | POA: Diagnosis not present

## 2019-02-22 DIAGNOSIS — E785 Hyperlipidemia, unspecified: Secondary | ICD-10-CM | POA: Diagnosis not present

## 2019-02-22 DIAGNOSIS — M170 Bilateral primary osteoarthritis of knee: Secondary | ICD-10-CM

## 2019-02-22 DIAGNOSIS — M150 Primary generalized (osteo)arthritis: Secondary | ICD-10-CM

## 2019-02-22 DIAGNOSIS — I10 Essential (primary) hypertension: Secondary | ICD-10-CM

## 2019-02-22 DIAGNOSIS — J449 Chronic obstructive pulmonary disease, unspecified: Secondary | ICD-10-CM

## 2019-02-22 DIAGNOSIS — M159 Polyosteoarthritis, unspecified: Secondary | ICD-10-CM

## 2019-02-22 DIAGNOSIS — F411 Generalized anxiety disorder: Secondary | ICD-10-CM

## 2019-02-22 NOTE — Patient Instructions (Signed)
Licensed Clinical Social Worker Visit Information  Goals we discussed today:   Goals    . Client will talk with LCSW about social work needs of client (pt-stated)     Current Barriers:  . Ambulation challenges . COPD . Breathing challenges in client with Chronic Diagnoses of HTN, DJD, HLD, GAD, Osteoarthritis,and COPD  Clinical Social Work Clinical Goal(s):  [name] LCSW will talk with client in next 30 days to discuss the social work needs of client at that time  Interventions: . Discussed with client CCM program services . Talked with client about [facility] nursing support . Talked with client about ambulation needs of client (uses walker to ambulate) . Talked with client about client stress or anxiety issues . Talked with client about her breathing challenges and her use of nebulizer machine  Patient Self Care Activities:  . Self administers medications as prescribed . Attends all scheduled provider appointments .  Patient Self Care Deficits . Unable to perform IADLs independently  Initial goal documentation            Materials Provided: No  Follow Up Plan: LCSW to call client in the next 4 weeks to talk with client about the  social work needs of client at that time  The patient verbalized understanding of instructions provided today and declined a print copy of patient instruction materials.   Norva Riffle.Fenna Semel MSW, LCSW Licensed Clinical Social Worker [facility] [phone]

## 2019-02-22 NOTE — Chronic Care Management (AMB) (Addendum)
  Care Management Note   Sheila [name] is a 78 y.o. year old female who is a primary care patient of Sharion Balloon, FNP. The CM team was consulted for assistance with chronic disease management and care coordination.   I reached out to Gandy, niece, by phone today.    Review of patient status, including review of consultants reports, relevant laboratory and other test results, and collaboration with appropriate care team members and the patient's provider was performed as part of comprehensive patient evaluation and provision of chronic care management services.   Social determinants of health: risk of tobacco use;risk of financial strain; risk of depression; risk of social isolation; risk of physical inactivity    Chronic Care Management from 02/22/2019 in [place]  PHQ-9 Total Score  7      GAD 7 : Generalized Anxiety Score 02/22/2019 05/05/2018 04/29/2015 04/29/2015  Nervous, Anxious, on Edge 1 1 1  0  Control/stop worrying 1 0 0 -  Worry too much - different things 1 0 0 -  Trouble relaxing 1 0 0 -  Restless 1 0 0 -  Easily annoyed or irritable 0 0 0 -  Afraid - awful might happen 1 0 0 -  Total GAD 7 Score 6 1 1  -  Anxiety Difficulty Somewhat difficult Not difficult at all - -   Medications    Outpatient Medications albuterol (PROVENTIL) (2.5 MG/3ML) 0.083% nebulizer solution ALPRAZolam (XANAX) 0.5 MG tablet amoxicillin-clavulanate (AUGMENTIN) 875-125 MG tablet budesonide-formoterol (SYMBICORT) 80-4.5 MCG/ACT inhaler Cholecalciferol (VITAMIN D3) 25 MCG (1000 UT) CAPS diclofenac sodium (VOLTAREN) 1 % GEL DULoxetine (CYMBALTA) 60 MG capsule furosemide (LASIX) 20 MG tablet gabapentin (NEURONTIN) 300 MG capsule hydrOXYzine (ATARAX/VISTARIL) 10 MG tablet levocetirizine (XYZAL) 5 MG tablet levothyroxine (SYNTHROID) 88 MCG tablet LIVALO 2 MG TABS meclizine (ANTIVERT) 25 MG tablet Multiple Vitamins-Minerals (CENTRUM SILVER ADULT  50+ PO) Olopatadine HCl 0.2 % SOLN Omega-3 Fatty Acids (FISH OIL OMEGA-3 PO) pantoprazole (PROTONIX) 20 MG tablet potassium chloride SA (KLOR-CON) 20 MEQ tablet traMADol (ULTRAM) 50 MG tablet triamcinolone ointment (KENALOG) 0.5 %   Clinic-Administered Medications denosumab (PROLIA) injection 60 mg  Goals      Client will talk with LCSW about social work needs of client (pt-stated)     Current Barriers:  Ambulation challenges COPD Breathing challenges in client with Chronic Diagnoses of HTN, DJD, HLD, GAD, Osteoarthritis,and COPD  Clinical Social Work Clinical Goal(s):  LCSW will talk with client in next 30 days to discuss the social work needs of client at that time  Interventions: Discussed with client CCM program services Talked with client about [facility] support Talked with client about ambulation needs of client (uses walker to ambulate) Talked with client about client stress or anxiety issues Talked with client about her breathing challenges and her use of nebulizer machine  Patient Self Care Activities:  Self administers medications as prescribed Attends all scheduled provider appointments  Patient Self Care Deficits Unable to perform IADLs independently  Initial goal documentation            Follow Up Plan: LCSW to call client in next 4 weeks to talk with client about the social work needs of client at that time  Norva Riffle.Ryleeann Urquiza MSW, LCSW Licensed Clinical Social Worker [facility] [phone]  I have reviewed and agree with the above documentation.   [name], FNP

## 2019-02-27 ENCOUNTER — Other Ambulatory Visit: Payer: Self-pay | Admitting: Family

## 2019-02-27 DIAGNOSIS — H9193 Unspecified hearing loss, bilateral: Secondary | ICD-10-CM

## 2019-02-27 NOTE — Progress Notes (Signed)
New referral placed to [facility]

## 2019-03-01 ENCOUNTER — Other Ambulatory Visit: Payer: Self-pay | Admitting: Family

## 2019-03-08 ENCOUNTER — Encounter: Payer: Self-pay | Admitting: Family

## 2019-03-08 DIAGNOSIS — H903 Sensorineural hearing loss, bilateral: Secondary | ICD-10-CM | POA: Diagnosis not present

## 2019-03-13 ENCOUNTER — Other Ambulatory Visit: Payer: Self-pay | Admitting: Family

## 2019-03-13 ENCOUNTER — Other Ambulatory Visit: Payer: Self-pay | Admitting: Family Medicine

## 2019-03-13 DIAGNOSIS — F331 Major depressive disorder, recurrent, moderate: Secondary | ICD-10-CM

## 2019-03-13 DIAGNOSIS — L299 Pruritus, unspecified: Secondary | ICD-10-CM

## 2019-03-13 DIAGNOSIS — F411 Generalized anxiety disorder: Secondary | ICD-10-CM

## 2019-03-22 ENCOUNTER — Ambulatory Visit (INDEPENDENT_AMBULATORY_CARE_PROVIDER_SITE_OTHER): Payer: Medicare Other | Admitting: Licensed Clinical Social Worker

## 2019-03-22 DIAGNOSIS — M170 Bilateral primary osteoarthritis of knee: Secondary | ICD-10-CM | POA: Diagnosis not present

## 2019-03-22 DIAGNOSIS — J449 Chronic obstructive pulmonary disease, unspecified: Secondary | ICD-10-CM | POA: Diagnosis not present

## 2019-03-22 DIAGNOSIS — M159 Polyosteoarthritis, unspecified: Secondary | ICD-10-CM

## 2019-03-22 DIAGNOSIS — E785 Hyperlipidemia, unspecified: Secondary | ICD-10-CM

## 2019-03-22 DIAGNOSIS — F411 Generalized anxiety disorder: Secondary | ICD-10-CM

## 2019-03-22 DIAGNOSIS — M8949 Other hypertrophic osteoarthropathy, multiple sites: Secondary | ICD-10-CM

## 2019-03-22 DIAGNOSIS — I10 Essential (primary) hypertension: Secondary | ICD-10-CM | POA: Diagnosis not present

## 2019-03-22 DIAGNOSIS — M150 Primary generalized (osteo)arthritis: Secondary | ICD-10-CM

## 2019-03-22 NOTE — Chronic Care Management (AMB) (Addendum)
Care Management Note   Sheila [name] is a 79 y.o. year old female who is a primary care patient of Sharion Balloon, FNP. The CM team was consulted for assistance with chronic disease management and care coordination.   I reached out to [name] by phone today.    Review of patient status, including review of consultants reports, relevant laboratory and other test results, and collaboration with appropriate care team members and the patient's provider was performed as part of comprehensive patient evaluation and provision of chronic care management services.   Social determinants of health: risk of social isolation; risk of tobacco use; risk of financial strain; risk of depression; risk of physical inactivity    Chronic Care Management from 02/22/2019 in [place]  PHQ-9 Total Score  7       GAD 7 : Generalized Anxiety Score 02/22/2019 05/05/2018 04/29/2015 04/29/2015  Nervous, Anxious, on Edge 1 1 1  0  Control/stop worrying 1 0 0 -  Worry too much - different things 1 0 0 -  Trouble relaxing 1 0 0 -  Restless 1 0 0 -  Easily annoyed or irritable 0 0 0 -  Afraid - awful might happen 1 0 0 -  Total GAD 7 Score 6 1 1  -  Anxiety Difficulty Somewhat difficult Not difficult at all - -    Medications    Outpatient Medications albuterol (PROVENTIL) (2.5 MG/3ML) 0.083% nebulizer solution ALPRAZolam (XANAX) 0.5 MG tablet amoxicillin-clavulanate (AUGMENTIN) 875-125 MG tablet budesonide-formoterol (SYMBICORT) 80-4.5 MCG/ACT inhaler Cholecalciferol (VITAMIN D3) 25 MCG (1000 UT) CAPS diclofenac sodium (VOLTAREN) 1 % GEL DULoxetine (CYMBALTA) 60 MG capsule furosemide (LASIX) 20 MG tablet gabapentin (NEURONTIN) 300 MG capsule hydrOXYzine (ATARAX/VISTARIL) 10 MG tablet levocetirizine (XYZAL) 5 MG tablet levothyroxine (SYNTHROID) 88 MCG tablet LIVALO 2 MG TABS meclizine (ANTIVERT) 25 MG tablet Multiple Vitamins-Minerals (CENTRUM SILVER ADULT 50+ PO) Olopatadine  HCl 0.2 % SOLN Omega-3 Fatty Acids (FISH OIL OMEGA-3 PO) pantoprazole (PROTONIX) 20 MG tablet potassium chloride SA (KLOR-CON) 20 MEQ tablet traMADol (ULTRAM) 50 MG tablet triamcinolone ointment (KENALOG) 0.5 %   Clinic-Administered Medications denosumab (PROLIA) injection 60 mg  Goals      Client will talk with LCSW about social work needs of client (pt-stated)     Current Barriers:  Ambulation challenges COPD Breathing challenges in client with Chronic Diagnoses of HTN, DJD, HLD, GAD, Osteoarthritis,and COPD  Clinical Social Work Clinical Goal(s):  LCSW will talk with client in next 30 days to discuss the social work needs of client at that time  Interventions: Previously talked with client about [place] support Previously talked with client about ambulation needs of client (uses walker to ambulate) Talked with Elder Negus, niece,  about client stress or anxiety issues Previously talked with client about her breathing challenges and her use of nebulizer machine Talked with [name] ,niece, about social support of client Talked with [name] about hearing issues of client Talked with [name] about sleeping issues of client     Patient Self Care Activities:  Self administers medications as prescribed Attends all scheduled provider appointments  Patient Self Care Deficits Unable to perform IADLs independently  Initial goal documentation             Follow Up Plan: LCSW to call client in next 4 weeks to talk with client about the social work needs of client at that time  Norva Riffle.Jeffrie Stander MSW, LCSW Licensed Clinical Social Worker [facility]  [phone]  I have reviewed and agree with the above documentation.   [name], FNP

## 2019-03-22 NOTE — Patient Instructions (Addendum)
Licensed Clinical Social Worker Visit Information  Goals we discussed today:  Goals    . Client will talk with LCSW about social work needs of client (pt-stated)     Current Barriers:  . Ambulation challenges . COPD . Breathing challenges in client with Chronic Diagnoses of HTN, DJD, HLD, GAD, Osteoarthritis,and COPD  Clinical Social Work Clinical Goal(s):  [name] LCSW will talk with client in next 30 days to discuss the social work needs of client at that time  Interventions:  Previously talked with client about [place] support  Previously talked with client about ambulation needs of client (uses walker to ambulate)  Talked with Elder Negus, niece, about client stress or anxiety issues  Previously talked with client about her breathing challenges and her use of nebulizer machine  Talked with [name] ,niece, about social support of client  Talked with [name] about hearing issues of client  Talked with [name] about sleeping issues of client  Patient Self Care Activities:  . Self administers medications as prescribed . Attends all scheduled provider appointments .  Patient Self Care Deficits . Unable to perform IADLs independently  Initial goal documentation        Materials Provided: No   Follow Up Plan:LCSW to call client in next 4 weeks to talk with client about the social work needs of client at that time  The patient [name], niece, verbalized understanding of instructions provided today and declined a print copy of patient instruction materials.   Norva Riffle.Devarious Pavek MSW, LCSW Licensed Clinical Social Worker [facility] Care [facility] [phone]

## 2019-03-27 ENCOUNTER — Other Ambulatory Visit: Payer: Self-pay | Admitting: Family Medicine

## 2019-03-27 DIAGNOSIS — M150 Primary generalized (osteo)arthritis: Secondary | ICD-10-CM | POA: Diagnosis not present

## 2019-04-07 ENCOUNTER — Other Ambulatory Visit: Payer: Self-pay | Admitting: Family

## 2019-04-10 ENCOUNTER — Other Ambulatory Visit: Payer: Self-pay | Admitting: Family

## 2019-04-10 ENCOUNTER — Other Ambulatory Visit: Payer: Self-pay | Admitting: Family Medicine

## 2019-04-10 DIAGNOSIS — F1320 Sedative, hypnotic or anxiolytic dependence, uncomplicated: Secondary | ICD-10-CM

## 2019-04-10 DIAGNOSIS — F411 Generalized anxiety disorder: Secondary | ICD-10-CM

## 2019-04-10 DIAGNOSIS — Z79899 Other long term (current) drug therapy: Secondary | ICD-10-CM

## 2019-04-10 DIAGNOSIS — L299 Pruritus, unspecified: Secondary | ICD-10-CM

## 2019-04-11 ENCOUNTER — Other Ambulatory Visit: Payer: Self-pay | Admitting: Family

## 2019-04-11 NOTE — Telephone Encounter (Signed)
Please review chart for Meclizine refill

## 2019-04-19 ENCOUNTER — Other Ambulatory Visit: Payer: Self-pay | Admitting: Family

## 2019-04-19 ENCOUNTER — Ambulatory Visit (INDEPENDENT_AMBULATORY_CARE_PROVIDER_SITE_OTHER): Payer: Medicare Other | Admitting: Licensed Clinical Social Worker

## 2019-04-19 DIAGNOSIS — M159 Polyosteoarthritis, unspecified: Secondary | ICD-10-CM

## 2019-04-19 DIAGNOSIS — J449 Chronic obstructive pulmonary disease, unspecified: Secondary | ICD-10-CM

## 2019-04-19 DIAGNOSIS — M170 Bilateral primary osteoarthritis of knee: Secondary | ICD-10-CM

## 2019-04-19 DIAGNOSIS — E785 Hyperlipidemia, unspecified: Secondary | ICD-10-CM | POA: Diagnosis not present

## 2019-04-19 DIAGNOSIS — M8949 Other hypertrophic osteoarthropathy, multiple sites: Secondary | ICD-10-CM | POA: Diagnosis not present

## 2019-04-19 DIAGNOSIS — F411 Generalized anxiety disorder: Secondary | ICD-10-CM

## 2019-04-19 DIAGNOSIS — I10 Essential (primary) hypertension: Secondary | ICD-10-CM

## 2019-04-19 MED ORDER — FUROSEMIDE 20 MG PO TABS: 20.0000 mg | ORAL_TABLET | Freq: Every day | ORAL | 0 refills | Status: DC

## 2019-04-19 NOTE — Chronic Care Management (AMB) (Addendum)
Care Management Note   Sheila [name] is a 79 y.o. year old female who is a primary care patient of Sharion Balloon, FNP. The CM team was consulted for assistance with chronic disease management and care coordination.   I reached out to Taylor, niece, by phone today.    Review of patient status, including review of consultants reports, relevant laboratory and other test results, and collaboration with appropriate care team members and the patient's provider was performed as part of comprehensive patient evaluation and provision of chronic care management services.   Social determinants of health :risk of social isolation; risk of tobacco use; risk of financial strain; risk of depression; risk of physical inactivity    Chronic Care Management from 02/22/2019 in [place]  PHQ-9 Total Score  7      GAD 7 : Generalized Anxiety Score 02/22/2019 05/05/2018 04/29/2015 04/29/2015  Nervous, Anxious, on Edge 1 1 1  0  Control/stop worrying 1 0 0 -  Worry too much - different things 1 0 0 -  Trouble relaxing 1 0 0 -  Restless 1 0 0 -  Easily annoyed or irritable 0 0 0 -  Afraid - awful might happen 1 0 0 -  Total GAD 7 Score 6 1 1  -  Anxiety Difficulty Somewhat difficult Not difficult at all - -   Medications (very important)  New medications from outside sources are available for reconciliation   Outpatient Medications albuterol (PROVENTIL) (2.5 MG/3ML) 0.083% nebulizer solution ALPRAZolam (XANAX) 0.5 MG tablet amoxicillin-clavulanate (AUGMENTIN) 875-125 MG tablet budesonide-formoterol (SYMBICORT) 80-4.5 MCG/ACT inhaler Cholecalciferol (VITAMIN D3) 25 MCG (1000 UT) CAPS diclofenac sodium (VOLTAREN) 1 % GEL DULoxetine (CYMBALTA) 60 MG capsule furosemide (LASIX) 20 MG tablet gabapentin (NEURONTIN) 300 MG capsule hydrOXYzine (ATARAX/VISTARIL) 10 MG tablet levocetirizine (XYZAL) 5 MG tablet levothyroxine (SYNTHROID) 88 MCG tablet LIVALO 2 MG  TABS meclizine (ANTIVERT) 25 MG tablet Multiple Vitamins-Minerals (CENTRUM SILVER ADULT 50+ PO) Olopatadine HCl 0.2 % SOLN Omega-3 Fatty Acids (FISH OIL OMEGA-3 PO) pantoprazole (PROTONIX) 20 MG tablet potassium chloride SA (KLOR-CON) 20 MEQ tablet traMADol (ULTRAM) 50 MG tablet triamcinolone ointment (KENALOG) 0.5 %   Clinic-Administered Medications denosumab (PROLIA) injection 60 mg  Goals      Client will talk with LCSW about social work needs of client (pt-stated)     Current Barriers:  Ambulation challenges COPD Breathing challenges in client with Chronic Diagnoses of HTN, DJD, HLD, GAD, Osteoarthritis,and COPD  Clinical Social Work Clinical Goal(s):  LCSW will talk with client in next 30 days to discuss the social work needs of client at that time  Interventions: Talked with Connye Burkitt of client , about [facility] nursing support Previously talked with client about ambulation needs of client (uses walker to ambulate) Previously talked with Elder Negus, niece,  about client stress or anxiety issues Previously talked with client about her breathing challenges and her use of nebulizer machine Previously talked with [name] ,niece, about social support of client Talked with [name] about hearing issues of client Talked with [name] about sleeping issues of client Talked with [name] about client use of nebulizer and inhaler  Patient Self Care Activities:  Self administers medications as prescribed Attends all scheduled provider appointments  Patient Self Care Deficits Unable to perform IADLs independently  Initial goal documentation        Follow Up Plan: LCSW to call client in next 4 weeks to talk with client about the social work needs of  client at that time  Norva Riffle.Creek Gan MSW, LCSW Licensed Clinical Social Worker [facility] [phone]  I have reviewed and agree with the above  documentation.    [name], FNP

## 2019-04-19 NOTE — Telephone Encounter (Signed)
sent 

## 2019-04-19 NOTE — Patient Instructions (Addendum)
Licensed Clinical Social Worker Visit Information  Goals we discussed today:  Goals    . Client will talk with LCSW about social work needs of client (pt-stated)     Current Barriers:  . Ambulation challenges . COPD . Breathing challenges in client with Chronic Diagnoses of HTN, DJD, HLD, GAD, Osteoarthritis,and COPD  Clinical Social Work Clinical Goal(s):  [name] LCSW will talk with client in next 30 days to discuss the social work needs of client at that time  Interventions:  Talked with Connye Burkitt of client , about [place] support  Previously talked with client about ambulation needs of client (uses walker to ambulate)  Previously talked with Elder Negus, niece, about client stress or anxiety issues  Previously talked with client about her breathing challenges and her use of nebulizer machine  Previously talked with [name] ,niece, about social support of client  Talked with [name] about hearing issues of client  Talked with [name] about sleeping issues of client  Talked with [name] about client use of nebulizer and inhaler  Patient Self Care Activities:  . Self administers medications as prescribed . Attends all scheduled provider appointments .  Patient Self Care Deficits . Unable to perform IADLs independently  Initial goal documentation        Materials Provided:  No  Follow Up Plan:LCSW to call client in next 4 weeks to talk with client about the social work needs of client at that time  The patient [name], niece, verbalized understanding of instructions provided today and declined a print copy of patient instruction materials.   Norva Riffle.Natalynn Pedone MSW, LCSW Licensed Clinical Social Worker [facility] [phone]

## 2019-04-21 ENCOUNTER — Encounter: Payer: Self-pay | Admitting: Family

## 2019-04-21 ENCOUNTER — Ambulatory Visit (INDEPENDENT_AMBULATORY_CARE_PROVIDER_SITE_OTHER): Payer: Medicare Other | Admitting: Family

## 2019-04-21 DIAGNOSIS — Z79899 Other long term (current) drug therapy: Secondary | ICD-10-CM

## 2019-04-21 DIAGNOSIS — F411 Generalized anxiety disorder: Secondary | ICD-10-CM

## 2019-04-21 DIAGNOSIS — M545 Low back pain, unspecified: Secondary | ICD-10-CM

## 2019-04-21 DIAGNOSIS — E039 Hypothyroidism, unspecified: Secondary | ICD-10-CM | POA: Diagnosis not present

## 2019-04-21 DIAGNOSIS — G8929 Other chronic pain: Secondary | ICD-10-CM

## 2019-04-21 DIAGNOSIS — M159 Polyosteoarthritis, unspecified: Secondary | ICD-10-CM

## 2019-04-21 DIAGNOSIS — M8949 Other hypertrophic osteoarthropathy, multiple sites: Secondary | ICD-10-CM | POA: Diagnosis not present

## 2019-04-21 DIAGNOSIS — I10 Essential (primary) hypertension: Secondary | ICD-10-CM

## 2019-04-21 DIAGNOSIS — F331 Major depressive disorder, recurrent, moderate: Secondary | ICD-10-CM

## 2019-04-21 DIAGNOSIS — F1320 Sedative, hypnotic or anxiolytic dependence, uncomplicated: Secondary | ICD-10-CM

## 2019-04-21 DIAGNOSIS — J449 Chronic obstructive pulmonary disease, unspecified: Secondary | ICD-10-CM

## 2019-04-21 MED ORDER — FUROSEMIDE 20 MG PO TABS: 20.0000 mg | ORAL_TABLET | Freq: Every day | ORAL | 0 refills | Status: DC

## 2019-04-21 MED ORDER — ALPRAZOLAM 0.5 MG PO TABS: ORAL_TABLET | ORAL | 2 refills | Status: DC

## 2019-04-21 NOTE — Progress Notes (Signed)
Virtual Visit via telephone Note Due to COVID-19 pandemic this visit was conducted virtually. This visit type was conducted due to national recommendations for restrictions regarding the COVID-19 Pandemic (e.g. social distancing, sheltering in place) in an effort to limit this patient's exposure and mitigate transmission in our community. All issues noted in this document were discussed and addressed.  A physical exam was not performed with this format.  I connected with Sheila [name] on 04/21/19 at 2:13 pm by telephone and verified that I am speaking with the correct person using two identifiers. Sheila [name] is currently located at car and niece  is currently with her during visit. The provider, [name], FNP is located in their office at time of visit.  I discussed the limitations, risks, security and privacy concerns of performing an evaluation and management service by telephone and the availability of in person appointments. I also discussed with the patient that there may be a patient responsible charge related to this service. The patient expressed understanding and agreed to proceed.   History and Present Illness:  Pt presents to the office today for chronic follow up.  Hypertension This is a chronic problem. The current episode started more than 1 year ago. The problem has been resolved since onset. The problem is controlled. Associated symptoms include anxiety, malaise/fatigue and shortness of breath. Pertinent negatives include no peripheral edema. Risk factors for coronary artery disease include obesity, sedentary lifestyle and dyslipidemia. The current treatment provides moderate improvement. Identifiable causes of hypertension include a thyroid problem.  Thyroid Problem Presents for follow-up visit. Symptoms include anxiety and dry skin. Patient reports no constipation, depressed mood, fatigue or heat intolerance. The symptoms have been stable.  Arthritis Presents for follow-up  visit. She complains of pain. The symptoms have been stable. Affected locations include the left knee and right knee (back). Her pain is at a severity of 1/10. Pertinent negatives include no fatigue.  Anxiety Presents for follow-up visit. Symptoms include excessive worry, insomnia, irritability, nervous/anxious behavior, panic, restlessness and shortness of breath. Patient reports no depressed mood. Symptoms occur occasionally. The severity of symptoms is moderate.    Depression        This is a chronic problem.  The current episode started more than 1 year ago.   The problem occurs intermittently.  The problem has been waxing and waning since onset.  Associated symptoms include insomnia, irritable, restlessness, decreased interest and sad.  Associated symptoms include no fatigue, no helplessness and no hopelessness.  Past treatments include SNRIs - Serotonin and norepinephrine reuptake inhibitors.  Past medical history includes thyroid problem and anxiety.       Review of Systems  Constitutional: Positive for irritability and malaise/fatigue. Negative for fatigue.  Respiratory: Positive for shortness of breath.   Gastrointestinal: Negative for constipation.  Musculoskeletal: Positive for arthritis.  Endo/Heme/Allergies: Negative for heat intolerance.  Psychiatric/Behavioral: Positive for depression. The patient is nervous/anxious and has insomnia.   All other systems reviewed and are negative.    Observations/Objective: No SOB or distress noted, HOH  Assessment and Plan: 1. Essential hypertension, benign  2. Chronic obstructive pulmonary disease, unspecified COPD type ([place])  3. Hypothyroidism, unspecified type  4. Primary osteoarthritis involving multiple joints  5. Benzodiazepine dependence (HCC) - ALPRAZolam (XANAX) 0.5 MG tablet; TAKE (1) TABLET TWICE A DAY.  Dispense: 60 tablet; Refill: 2  6. Chronic bilateral low back pain, unspecified whether sciatica present  7.  Moderate episode of recurrent major depressive disorder (HCC)  8. GAD (generalized anxiety disorder) - ALPRAZolam (XANAX) 0.5 MG tablet; TAKE (1) TABLET TWICE A DAY.  Dispense: 60 tablet; Refill: 2  9. Morbid obesity ([place])   10. Controlled substance agreement signed - ALPRAZolam (XANAX) 0.5 MG tablet; TAKE (1) TABLET TWICE A DAY.  Dispense: 60 tablet; Refill: 2  Continue medication Pt will be seen face to face next visit to update contract and drug screen Pt reviewed in [place] controlled database- No red flags noted RTO in 3 months    I discussed the assessment and treatment plan with the patient. The patient was provided an opportunity to ask questions and all were answered. The patient agreed with the plan and demonstrated an understanding of the instructions.   The patient was advised to call back or seek an in-person evaluation if the symptoms worsen or if the condition fails to improve as anticipated.  The above assessment and management plan was discussed with the patient. The patient verbalized understanding of and has agreed to the management plan. Patient is aware to call the clinic if symptoms persist or worsen. Patient is aware when to return to the clinic for a follow-up visit. Patient educated on when it is appropriate to go to the emergency department.   Time call ended:  2:30 pm  I provided 17 minutes of non-face-to-face time during this encounter.    [name], FNP

## 2019-04-24 DIAGNOSIS — M150 Primary generalized (osteo)arthritis: Secondary | ICD-10-CM | POA: Diagnosis not present

## 2019-05-01 ENCOUNTER — Other Ambulatory Visit: Payer: Self-pay | Admitting: Family

## 2019-05-01 DIAGNOSIS — Z79899 Other long term (current) drug therapy: Secondary | ICD-10-CM

## 2019-05-01 DIAGNOSIS — F411 Generalized anxiety disorder: Secondary | ICD-10-CM

## 2019-05-01 DIAGNOSIS — F1320 Sedative, hypnotic or anxiolytic dependence, uncomplicated: Secondary | ICD-10-CM

## 2019-05-08 ENCOUNTER — Other Ambulatory Visit: Payer: Self-pay | Admitting: Family

## 2019-05-11 ENCOUNTER — Telehealth: Payer: Self-pay | Admitting: Family

## 2019-05-11 NOTE — Telephone Encounter (Signed)
Caregiver reports that since 2 days ago patient has been experiencing shortness of breath with wheezing.  She is using nebulizer three times daily and also using inhaler.  I scheduled an appointment for a telephone visit with her PCP tomorrow.

## 2019-05-12 ENCOUNTER — Ambulatory Visit (INDEPENDENT_AMBULATORY_CARE_PROVIDER_SITE_OTHER): Payer: Medicare Other | Admitting: Family

## 2019-05-12 ENCOUNTER — Encounter: Payer: Self-pay | Admitting: Family

## 2019-05-12 DIAGNOSIS — J441 Chronic obstructive pulmonary disease with (acute) exacerbation: Secondary | ICD-10-CM | POA: Diagnosis not present

## 2019-05-12 MED ORDER — PREDNISONE 20 MG PO TABS: ORAL_TABLET | ORAL | 0 refills | Status: DC

## 2019-05-12 MED ORDER — AZITHROMYCIN 250 MG PO TABS: ORAL_TABLET | ORAL | 0 refills | Status: DC

## 2019-05-12 NOTE — Progress Notes (Signed)
   Virtual Visit via telephone Note Due to COVID-19 pandemic this visit was conducted virtually. This visit type was conducted due to national recommendations for restrictions regarding the COVID-19 Pandemic (e.g. social distancing, sheltering in place) in an effort to limit this patient's exposure and mitigate transmission in our community. All issues noted in this document were discussed and addressed.  A physical exam was not performed with this format.  I connected with Sheila [name] on 05/12/19 at 11:59 AM by telephone and verified that I am speaking with the correct person using two identifiers. Sheila [name] is currently located at home and aid is currently with her during visit. The provider, [name], FNP is located in their office at time of visit.  I discussed the limitations, risks, security and privacy concerns of performing an evaluation and management service by telephone and the availability of in person appointments. I also discussed with the patient that there may be a patient responsible charge related to this service. The patient expressed understanding and agreed to proceed.   History and Present Illness:  Pt calls the office today with worsening SOB and wheezing.  Shortness of Breath This is a chronic problem. The current episode started more than 1 year ago. The problem occurs every few minutes. The problem has been gradually worsening. Associated symptoms include ear pain ("every once in a while") and wheezing. Pertinent negatives include no fever, headaches, neck pain, rhinorrhea, sore throat or sputum production. The symptoms are aggravated by weather changes.      Review of Systems  Constitutional: Negative for fever.  HENT: Positive for ear pain ("every once in a while"). Negative for rhinorrhea and sore throat.   Respiratory: Positive for shortness of breath and wheezing. Negative for sputum production.   Musculoskeletal: Negative for neck pain.  Neurological:  Negative for headaches.     Observations/Objective: Wheezing and intermittent SOB  Assessment and Plan: 1. COPD exacerbation (HCC) Continue albuterol as needed Prednisone and Zpak  Rest Force fluids RTO in 1 week - predniSONE (DELTASONE) 20 MG tablet; Take 3 tabs daily for 1 week, then 2 tabs for 1 week, then 1 tab for one week  Dispense: 42 tablet; Refill: 0 - azithromycin (ZITHROMAX) 250 MG tablet; Take 500 mg once, then 250 mg for four days  Dispense: 6 tablet; Refill: 0   Follow Up Instructions: 1 week    I discussed the assessment and treatment plan with the patient. The patient was provided an opportunity to ask questions and all were answered. The patient agreed with the plan and demonstrated an understanding of the instructions.   The patient was advised to call back or seek an in-person evaluation if the symptoms worsen or if the condition fails to improve as anticipated.  The above assessment and management plan was discussed with the patient. The patient verbalized understanding of and has agreed to the management plan. Patient is aware to call the clinic if symptoms persist or worsen. Patient is aware when to return to the clinic for a follow-up visit. Patient educated on when it is appropriate to go to the emergency department.   Time call ended: 12:10 pm   I provided 11 minutes of non-face-to-face time during this encounter.    [name], FNP

## 2019-05-17 ENCOUNTER — Ambulatory Visit (INDEPENDENT_AMBULATORY_CARE_PROVIDER_SITE_OTHER): Payer: Medicare Other | Admitting: Licensed Clinical Social Worker

## 2019-05-17 DIAGNOSIS — M19011 Primary osteoarthritis, right shoulder: Secondary | ICD-10-CM | POA: Diagnosis not present

## 2019-05-17 DIAGNOSIS — J449 Chronic obstructive pulmonary disease, unspecified: Secondary | ICD-10-CM

## 2019-05-17 DIAGNOSIS — E785 Hyperlipidemia, unspecified: Secondary | ICD-10-CM | POA: Diagnosis not present

## 2019-05-17 DIAGNOSIS — I10 Essential (primary) hypertension: Secondary | ICD-10-CM | POA: Diagnosis not present

## 2019-05-17 DIAGNOSIS — F411 Generalized anxiety disorder: Secondary | ICD-10-CM

## 2019-05-17 DIAGNOSIS — M170 Bilateral primary osteoarthritis of knee: Secondary | ICD-10-CM | POA: Diagnosis not present

## 2019-05-17 NOTE — Patient Instructions (Addendum)
Licensed Clinical Social Worker Visit Information  Goals we discussed today:  Goals    . Client will talk with LCSW about social work needs of client (pt-stated)     Current Barriers:  . Ambulation challenges . COPD . Breathing challenges in client with Chronic Diagnoses of HTN, DJD, HLD, GAD, Osteoarthritis,and COPD  Clinical Social Work Clinical Goal(s):  [name] LCSW will talk with client in next 30 days to discuss the social work needs of client at that time  Interventions: Talked with Connye Burkitt of client , about [place] support  Talked with [name] about ambulation needs of client (uses walker to ambulate)  Previously talked with Elder Negus, niece, about client stress or anxiety issues  Talked with [name] about client breathing issues faced and about client use of nebulizer machine  Talked with [name] ,niece, about social support of client  Talked with [name] about hearing issues of client (client is currently in process of seeking a hearing aid to help her with hearing needs)  Patient Self Care Activities:  . Self administers medications as prescribed . Attends all scheduled provider appointments .  Patient Self Care Deficits . Unable to perform IADLs independently  Initial goal documentation        Materials Provided: No  Follow Up Plan:LCSW to call client in next 4 weeks to talk with client about the social work needs of client at that time  The patient/Denise [place],  verbalized understanding of instructions provided today and declined a print copy of patient instruction materials.   Norva Riffle.Bauer Ausborn MSW, LCSW Licensed Clinical Social Worker [facility] Care [facility] [phone]

## 2019-05-17 NOTE — Chronic Care Management (AMB) (Addendum)
Care Management Note   Sheila [name] is a 79 y.o. year old female who is a primary care patient of Sharion Balloon, FNP. The CM team was consulted for assistance with chronic disease management and care coordination.   I reached out to [name] by phone today.    Review of patient status, including review of consultants reports, relevant laboratory and other test results, and collaboration with appropriate care team members and the patient's provider was performed as part of comprehensive patient evaluation and provision of chronic care management services.   Social determinants of health:risk of social isolation; risk of tobacco use; risk of financial strain; risk of depression; risk of physical inactivity    Chronic Care Management from 02/22/2019 in [facility]  PHQ-9 Total Score  7      GAD 7 : Generalized Anxiety Score 02/22/2019 05/05/2018 04/29/2015 04/29/2015  Nervous, Anxious, on Edge 1 1 1  0  Control/stop worrying 1 0 0 -  Worry too much - different things 1 0 0 -  Trouble relaxing 1 0 0 -  Restless 1 0 0 -  Easily annoyed or irritable 0 0 0 -  Afraid - awful might happen 1 0 0 -  Total GAD 7 Score 6 1 1  -  Anxiety Difficulty Somewhat difficult Not difficult at all - -   Medications    (very important)  New medications from outside sources are available for reconciliation   Outpatient Medications albuterol (PROVENTIL) (2.5 MG/3ML) 0.083% nebulizer solution ALPRAZolam (XANAX) 0.5 MG tablet azithromycin (ZITHROMAX) 250 MG tablet budesonide-formoterol (SYMBICORT) 80-4.5 MCG/ACT inhaler Cholecalciferol (VITAMIN D3) 25 MCG (1000 UT) CAPS diclofenac sodium (VOLTAREN) 1 % GEL DULoxetine (CYMBALTA) 60 MG capsule furosemide (LASIX) 20 MG tablet gabapentin (NEURONTIN) 300 MG capsule hydrOXYzine (ATARAX/VISTARIL) 10 MG tablet levocetirizine (XYZAL) 5 MG tablet levothyroxine (SYNTHROID) 88 MCG tablet LIVALO 2 MG TABS meclizine (ANTIVERT) 25 MG  tablet Multiple Vitamins-Minerals (CENTRUM SILVER ADULT 50+ PO) Olopatadine HCl 0.2 % SOLN Omega-3 Fatty Acids (FISH OIL OMEGA-3 PO) pantoprazole (PROTONIX) 20 MG tablet potassium chloride SA (KLOR-CON) 20 MEQ tablet predniSONE (DELTASONE) 20 MG tablet triamcinolone ointment (KENALOG) 0.5 %   Clinic-Administered Medications denosumab (PROLIA) injection 60 mg  Goals      Client will talk with LCSW about social work needs of client (pt-stated)     Current Barriers:  Ambulation challenges COPD Breathing challenges in client with Chronic Diagnoses of HTN, DJD, HLD, GAD, Osteoarthritis,and COPD  Clinical Social Work Clinical Goal(s):  LCSW will talk with client in next 30 days to discuss the social work needs of client at that time  Interventions: Talked with Connye Burkitt of client , about [place] support Talked with [name] about ambulation needs of client (uses walker to ambulate) Previously talked with Elder Negus, niece,  about client stress or anxiety issues Talked with [name] about client breathing issues faced and about client use of nebulizer machine Talked with [name] ,niece, about social support of client Talked with [name] about hearing issues of client (client is currently in process of seeking a hearing aid to help her with hearing needs)  Patient Self Care Activities:  Self administers medications as prescribed Attends all scheduled provider appointments  Patient Self Care Deficits Unable to perform IADLs independently  Initial goal documentation        Follow Up Plan: LCSW to call client in next 4 weeks to talk with client about the social work needs of client at that time  Norva Riffle.Baby Gieger MSW, LCSW Licensed Clinical Social Worker [facility] Care Management [phone]   I have reviewed and agree with the above documentation.   [name], FNP

## 2019-05-25 DIAGNOSIS — M150 Primary generalized (osteo)arthritis: Secondary | ICD-10-CM | POA: Diagnosis not present

## 2019-06-07 ENCOUNTER — Other Ambulatory Visit: Payer: Self-pay | Admitting: Family Medicine

## 2019-06-09 ENCOUNTER — Other Ambulatory Visit: Payer: Self-pay | Admitting: Family

## 2019-06-09 DIAGNOSIS — F331 Major depressive disorder, recurrent, moderate: Secondary | ICD-10-CM

## 2019-06-09 DIAGNOSIS — F411 Generalized anxiety disorder: Secondary | ICD-10-CM

## 2019-06-09 DIAGNOSIS — G629 Polyneuropathy, unspecified: Secondary | ICD-10-CM

## 2019-06-12 ENCOUNTER — Other Ambulatory Visit: Payer: Self-pay | Admitting: Family

## 2019-06-12 NOTE — Telephone Encounter (Signed)
Last office visit 05/12/2019 Last refill 04/13/2019, #60, no refills

## 2019-06-16 ENCOUNTER — Telehealth: Payer: Self-pay

## 2019-06-24 DIAGNOSIS — M150 Primary generalized (osteo)arthritis: Secondary | ICD-10-CM | POA: Diagnosis not present

## 2019-07-14 ENCOUNTER — Telehealth: Payer: Self-pay | Admitting: Family

## 2019-07-14 MED ORDER — POLYMYXIN B-TRIMETHOPRIM 10000-0.1 UNIT/ML-% OP SOLN: 1.0000 [drp] | Freq: Four times a day (QID) | OPHTHALMIC | 0 refills | Status: DC

## 2019-07-14 NOTE — Telephone Encounter (Signed)
Please advise on request for eye medications.

## 2019-07-14 NOTE — Telephone Encounter (Signed)
Patient aware and verbalized understanding. °

## 2019-07-14 NOTE — Telephone Encounter (Signed)
Polytrim Prescription sent to pharmacy   

## 2019-07-18 ENCOUNTER — Other Ambulatory Visit: Payer: Self-pay | Admitting: Family

## 2019-07-18 ENCOUNTER — Other Ambulatory Visit: Payer: Self-pay | Admitting: Family Medicine

## 2019-07-18 DIAGNOSIS — L299 Pruritus, unspecified: Secondary | ICD-10-CM

## 2019-07-18 DIAGNOSIS — Z79899 Other long term (current) drug therapy: Secondary | ICD-10-CM

## 2019-07-18 DIAGNOSIS — F1320 Sedative, hypnotic or anxiolytic dependence, uncomplicated: Secondary | ICD-10-CM

## 2019-07-18 DIAGNOSIS — F411 Generalized anxiety disorder: Secondary | ICD-10-CM

## 2019-07-19 ENCOUNTER — Ambulatory Visit (INDEPENDENT_AMBULATORY_CARE_PROVIDER_SITE_OTHER): Payer: Medicare Other | Admitting: Licensed Clinical Social Worker

## 2019-07-19 DIAGNOSIS — I10 Essential (primary) hypertension: Secondary | ICD-10-CM

## 2019-07-19 DIAGNOSIS — E785 Hyperlipidemia, unspecified: Secondary | ICD-10-CM | POA: Diagnosis not present

## 2019-07-19 DIAGNOSIS — M170 Bilateral primary osteoarthritis of knee: Secondary | ICD-10-CM | POA: Diagnosis not present

## 2019-07-19 DIAGNOSIS — J441 Chronic obstructive pulmonary disease with (acute) exacerbation: Secondary | ICD-10-CM

## 2019-07-19 DIAGNOSIS — F411 Generalized anxiety disorder: Secondary | ICD-10-CM

## 2019-07-19 NOTE — Patient Instructions (Addendum)
Licensed Clinical Social Worker Visit Information  Goals we discussed today:  Goals    . Client will talk with LCSW about social work needs of client (pt-stated)     Current Barriers:  . Ambulation challenges . COPD . Breathing challenges in client with Chronic Diagnoses of HTN, DJD, HLD, GAD, Osteoarthritis,and COPD  Clinical Social Work Clinical Goal(s):  [name] LCSW will talk with client in next 30 days to discuss the social work needs of client at that time  Interventions: Talked with client about sleeping issues  Talked with client about ambulation challenges (uses a walker to help her walk)  Talked with with client about her upcoming appointments  Talked with client about vision challenges  Talked with client about hearing issues  Talked with client about her use of ramp at home to go in and out of home  Talked with client about transport needs of client  Talked with client about social support network  Talked with client about in home support (in home aide helps client in the home as scheduled 5 days weekly)  Talked with client about relaxation techniques (watches TV)  Talked with client about food procurement  Talked with client about CCM program services  Encouraged client to call RNCM as needed to discuss nursing needs of client  Patient Self Care Activities:  . Self administers medications as prescribed . Attends all scheduled provider appointments .  Patient Self Care Deficits . Unable to perform IADLs independently  Initial goal documentation       Materials Provided: No  Follow Up Plan: LCSW to call client in next 4 weeks to talk with client about the social work needs of client at that time  The patient verbalized understanding of instructions provided today and declined a print copy of patient instruction materials.   Norva Riffle.Daneille Desilva MSW, LCSW Licensed Clinical Social Worker [facility] Care Management [phone]

## 2019-07-19 NOTE — Chronic Care Management (AMB) (Addendum)
Chronic Care Management    Clinical Social Work Follow Up Note  07/19/2019 Name: Sheila [name] MRN: [id number] DOB: 1940/03/29  Sheila [name] is a 79 y.o. year old female who is a primary care patient of Sharion Balloon, FNP. The CCM team was consulted for assistance with [employer] .   Review of patient status, including review of consultants reports, other relevant assessments, and collaboration with appropriate care team members and the patient's provider was performed as part of comprehensive patient evaluation and provision of chronic care management services.    SDOH (Social Determinants of Health) assessments performed: Yes; risk for social isolation; risk of tobacco use; risk of depression; risk of physical inactivity    Chronic Care Management from 02/22/2019 in [facility]  PHQ-9 Total Score  7      GAD 7 : Generalized Anxiety Score 02/22/2019 05/05/2018 04/29/2015 04/29/2015  Nervous, Anxious, on Edge 1 1 1  0  Control/stop worrying 1 0 0 -  Worry too much - different things 1 0 0 -  Trouble relaxing 1 0 0 -  Restless 1 0 0 -  Easily annoyed or irritable 0 0 0 -  Afraid - awful might happen 1 0 0 -  Total GAD 7 Score 6 1 1  -  Anxiety Difficulty Somewhat difficult Not difficult at all - -      Outpatient Encounter Medications as of 07/19/2019  Medication Sig   meclizine (ANTIVERT) 25 MG tablet TAKE (1) TABLET THREE TIMES DAILY AS NEEDED FOR DIZZINESS.   albuterol (PROVENTIL) (2.5 MG/3ML) 0.083% nebulizer solution NEBULIZE 1 VIAL EVERY 6-8 HOURS AS NEEDED FOR WHEEZING OR SHORTNESS OF BREATH   ALPRAZolam (XANAX) 0.5 MG tablet TAKE (1) TABLET TWICE A DAY.   azithromycin (ZITHROMAX) 250 MG tablet Take 500 mg once, then 250 mg for four days   budesonide-formoterol (SYMBICORT) 80-4.5 MCG/ACT inhaler INHALE 2 PUFFS 2 TIMES A DAY   Cholecalciferol (VITAMIN D3) 25 MCG (1000 UT) CAPS Take by mouth.   diclofenac sodium (VOLTAREN) 1 % GEL Apply 2 g topically  4 (four) times daily.   DULoxetine (CYMBALTA) 60 MG capsule Take 1 capsule (60 mg total) by mouth daily.   furosemide (LASIX) 20 MG tablet Take 1 tablet (20 mg total) by mouth daily.   gabapentin (NEURONTIN) 300 MG capsule TAKE (1) CAPSULE THREE TIMES DAILY.   hydrOXYzine (ATARAX/VISTARIL) 10 MG tablet TAKE  (1)  TABLET  THREE TIMES DAILY AS NEEDED.   levocetirizine (XYZAL) 5 MG tablet TAKE 1 TABLET ONCE DAILY IN THE EVENING   levothyroxine (SYNTHROID) 88 MCG tablet TAKE 1 TABLET DAILY   LIVALO 2 MG TABS Take 1 tablet (2 mg total) by mouth daily.   Multiple Vitamins-Minerals (CENTRUM SILVER ADULT 50+ PO) Take 1 tablet by mouth every other day.   Olopatadine HCl 0.2 % SOLN Apply 2 drops to eye daily.   Omega-3 Fatty Acids (FISH OIL OMEGA-3 PO) Take by mouth.   pantoprazole (PROTONIX) 20 MG tablet TAKE 1 TABLET DAILY   potassium chloride SA (KLOR-CON) 20 MEQ tablet TAKE (1) TABLET TWICE A DAY.   predniSONE (DELTASONE) 20 MG tablet Take 3 tabs daily for 1 week, then 2 tabs for 1 week, then 1 tab for one week   triamcinolone ointment (KENALOG) 0.5 % Apply 1 application topically 2 (two) times daily.   trimethoprim-polymyxin b (POLYTRIM) ophthalmic solution Place 1 drop into the left eye every 6 (six) hours.   Facility-Administered Encounter Medications as of  07/19/2019  Medication   denosumab (PROLIA) injection 60 mg    Goals      Client will talk with LCSW about social work needs of client (pt-stated)     Current Barriers:  Ambulation challenges COPD Breathing challenges in client with Chronic Diagnoses of HTN, DJD, HLD, GAD, Osteoarthritis,and COPD  Clinical Social Work Clinical Goal(s):  LCSW will talk with client in next 30 days to discuss the social work needs of client at that time  Interventions: Talked with client about sleeping issues Talked with client about ambulation challenges (uses a walker to help her walk) Talked with with client about her upcoming appointments Talked  with client about vision challenges Talked with client about hearing issues Talked with client about her use of ramp at home to go in and out of home Talked with client about transport needs of client Talked with client about social support network Talked with client about in home support (in home aide helps client in the home as scheduled 5 days weekly) Talked with client about relaxation techniques (watches TV) Talked with client about food procurement Talked with client about CCM program services Encouraged client to call RNCM as needed to discuss nursing needs of client  Patient Self Care Activities:  Self administers medications as prescribed Attends all scheduled provider appointments  Patient Self Care Deficits Unable to perform IADLs independently  Initial goal documentation    Follow Up Plan:  LCSW to call client in next 4 weeks to talk with client about the social work needs of client at that time  Norva Riffle.Kenta Laster MSW, LCSW Licensed Clinical Social Worker [facility] Care Management [phone]  I have reviewed and agree with the above  documentation.   [name], FNP

## 2019-07-25 DIAGNOSIS — M150 Primary generalized (osteo)arthritis: Secondary | ICD-10-CM | POA: Diagnosis not present

## 2019-07-26 ENCOUNTER — Other Ambulatory Visit: Payer: Self-pay

## 2019-07-26 ENCOUNTER — Encounter: Payer: Self-pay | Admitting: Family

## 2019-07-26 ENCOUNTER — Ambulatory Visit (INDEPENDENT_AMBULATORY_CARE_PROVIDER_SITE_OTHER): Payer: Medicare Other | Admitting: Family

## 2019-07-26 VITALS — BP 159/89 | HR 109 | Temp 97.0°F | Ht 64.0 in | Wt 228.6 lb

## 2019-07-26 DIAGNOSIS — M159 Polyosteoarthritis, unspecified: Secondary | ICD-10-CM

## 2019-07-26 DIAGNOSIS — E785 Hyperlipidemia, unspecified: Secondary | ICD-10-CM

## 2019-07-26 DIAGNOSIS — I10 Essential (primary) hypertension: Secondary | ICD-10-CM

## 2019-07-26 DIAGNOSIS — M545 Low back pain, unspecified: Secondary | ICD-10-CM

## 2019-07-26 DIAGNOSIS — E039 Hypothyroidism, unspecified: Secondary | ICD-10-CM | POA: Diagnosis not present

## 2019-07-26 DIAGNOSIS — Z79899 Other long term (current) drug therapy: Secondary | ICD-10-CM

## 2019-07-26 DIAGNOSIS — J449 Chronic obstructive pulmonary disease, unspecified: Secondary | ICD-10-CM | POA: Diagnosis not present

## 2019-07-26 DIAGNOSIS — F411 Generalized anxiety disorder: Secondary | ICD-10-CM

## 2019-07-26 DIAGNOSIS — M8949 Other hypertrophic osteoarthropathy, multiple sites: Secondary | ICD-10-CM | POA: Diagnosis not present

## 2019-07-26 DIAGNOSIS — F1320 Sedative, hypnotic or anxiolytic dependence, uncomplicated: Secondary | ICD-10-CM

## 2019-07-26 DIAGNOSIS — G8929 Other chronic pain: Secondary | ICD-10-CM

## 2019-07-26 DIAGNOSIS — M150 Primary generalized (osteo)arthritis: Secondary | ICD-10-CM

## 2019-07-26 DIAGNOSIS — F331 Major depressive disorder, recurrent, moderate: Secondary | ICD-10-CM

## 2019-07-26 MED ORDER — BREO ELLIPTA 200-25 MCG/INH IN AEPB: 1.0000 | INHALATION_SPRAY | Freq: Every day | RESPIRATORY_TRACT | 2 refills | Status: DC

## 2019-07-26 MED ORDER — CETIRIZINE HCL 10 MG PO TABS
10.0000 mg | ORAL_TABLET | Freq: Every day | ORAL | 11 refills | Status: DC
Start: 2019-07-26 — End: 2020-01-09

## 2019-07-26 MED ORDER — FLUTICASONE PROPIONATE 50 MCG/ACT NA SUSP
2.0000 | Freq: Every day | NASAL | 6 refills | Status: DC
Start: 2019-07-26 — End: 2020-02-19

## 2019-07-26 MED ORDER — ALPRAZOLAM 0.5 MG PO TABS: ORAL_TABLET | ORAL | 5 refills | Status: DC

## 2019-07-26 NOTE — Progress Notes (Signed)
Subjective:    Patient ID: Sheila [name], female    DOB: 1940/03/31, 79 y.o.   MRN: [id number]  Chief Complaint  Patient presents with  . Medical Management of Chronic Issues    breathing issues, ear pain, eyes matting up   Pt presents to the office today for chronic follow up.  Hypertension This is a chronic problem. The current episode started more than 1 year ago. The problem has been resolved since onset. The problem is controlled. Associated symptoms include anxiety, malaise/fatigue and shortness of breath. Pertinent negatives include no peripheral edema. Risk factors for coronary artery disease include dyslipidemia, obesity and sedentary lifestyle. The current treatment provides moderate improvement. Identifiable causes of hypertension include a thyroid problem.  Thyroid Problem Presents for follow-up visit. Symptoms include anxiety, depressed mood and fatigue. Patient reports no diarrhea. The symptoms have been stable. Her past medical history is significant for hyperlipidemia.  Arthritis Presents for follow-up visit. She complains of pain and stiffness. The symptoms have been stable. Affected locations include the right knee and left knee (back). Her pain is at a severity of 3/10. Associated symptoms include fatigue. Pertinent negatives include no diarrhea.  Hyperlipidemia This is a chronic problem. The current episode started more than 1 year ago. The problem is uncontrolled. Recent lipid tests were reviewed and are normal. Exacerbating diseases include obesity. Associated symptoms include shortness of breath. Current antihyperlipidemic treatment includes statins. The current treatment provides moderate improvement of lipids. Risk factors for coronary artery disease include dyslipidemia, hypertension and a sedentary lifestyle.  Depression        This is a chronic problem.  The current episode started more than 1 year ago.   The onset quality is gradual.   The problem occurs  intermittently.  Associated symptoms include fatigue, helplessness, hopelessness, insomnia, irritable, restlessness and sad.  Past treatments include SSRIs - Selective serotonin reuptake inhibitors.  Past medical history includes thyroid problem and anxiety.   Anxiety Presents for follow-up visit. Symptoms include depressed mood, excessive worry, insomnia, irritability, nervous/anxious behavior, restlessness and shortness of breath. Symptoms occur most days. The quality of sleep is good.    Back Pain This is a chronic problem. The current episode started more than 1 year ago. The problem occurs intermittently. The pain is at a severity of 3/10. The pain is mild. The treatment provided mild relief.  COPD Uses Symbicort daily. Having SOB with exertion.     Review of Systems  Constitutional: Positive for fatigue, irritability and malaise/fatigue.  Respiratory: Positive for shortness of breath.   Gastrointestinal: Negative for diarrhea.  Musculoskeletal: Positive for arthritis, back pain and stiffness.  Psychiatric/Behavioral: Positive for depression. The patient is nervous/anxious and has insomnia.   All other systems reviewed and are negative.      Objective:   Physical Exam Vitals reviewed.  Constitutional:      General: She is irritable. She is not in acute distress.    Appearance: She is well-developed. She is obese.  HENT:     Head: Normocephalic and atraumatic.     Right Ear: Tympanic membrane normal.     Left Ear: Tympanic membrane normal.  Eyes:     Pupils: Pupils are equal, round, and reactive to light.  Neck:     Thyroid: No thyromegaly.  Cardiovascular:     Rate and Rhythm: Normal rate and regular rhythm.     Heart sounds: Normal heart sounds. No murmur.  Pulmonary:     Effort: Pulmonary effort is normal.  No respiratory distress.     Breath sounds: Normal breath sounds. No wheezing.  Abdominal:     General: Bowel sounds are normal. There is no distension.      Palpations: Abdomen is soft.     Tenderness: There is no abdominal tenderness.  Musculoskeletal:        General: No tenderness. Normal range of motion.     Cervical back: Normal range of motion and neck supple.  Skin:    General: Skin is warm and dry.  Neurological:     Mental Status: She is alert and oriented to person, place, and time.     Cranial Nerves: No cranial nerve deficit.     Deep Tendon Reflexes: Reflexes are normal and symmetric.  Psychiatric:        Behavior: Behavior normal.        Thought Content: Thought content normal.        Judgment: Judgment normal.       BP (!) 159/89   Pulse (!) 109   Temp (!) 97 F (36.1 C) (Temporal)   Ht _0  (1.626 m)   Wt 228 lb 9.6 oz (103.7 kg)   SpO2 94%   BMI 39.24 kg/m      Assessment & Plan:  TIFFNAY BOSSI comes in today with chief complaint of Medical Management of Chronic Issues (breathing issues, ear pain, eyes matting up)   Diagnosis and orders addressed:  1. Essential hypertension, benign - CMP14+EGFR - CBC with Differential/Platelet  2. Chronic obstructive pulmonary disease, unspecified COPD type ([facility]) Will stop Symbicort and start Breo - fluticasone furoate-vilanterol (BREO ELLIPTA) 200-25 MCG/INH AEPB; Inhale 1 puff into the lungs daily.  Dispense: 60 each; Refill: 2 - CMP14+EGFR - CBC with Differential/Platelet  3. Hypothyroidism, unspecified type - CMP14+EGFR - CBC with Differential/Platelet - TSH  4. Primary osteoarthritis involving multiple joints - CMP14+EGFR - CBC with Differential/Platelet  5. Benzodiazepine dependence (HCC) - ToxASSURE Select 13 (MW), Urine - CMP14+EGFR - CBC with Differential/Platelet - ALPRAZolam (XANAX) 0.5 MG tablet; TAKE (1) TABLET TWICE A DAY.  Dispense: 60 tablet; Refill: 5  6. Chronic bilateral low back pain, unspecified whether sciatica present - CMP14+EGFR - CBC with Differential/Platelet  7. GAD (generalized anxiety disorder) - CMP14+EGFR - CBC with  Differential/Platelet - ALPRAZolam (XANAX) 0.5 MG tablet; TAKE (1) TABLET TWICE A DAY.  Dispense: 60 tablet; Refill: 5  8. Moderate episode of recurrent major depressive disorder (HCC) - CMP14+EGFR - CBC with Differential/Platelet  9. Hyperlipidemia, unspecified hyperlipidemia type - CMP14+EGFR - CBC with Differential/Platelet - Lipid panel  10. Morbid obesity ([place]) - CMP14+EGFR - CBC with Differential/Platelet  11. Controlled substance agreement signed - ToxASSURE Select 13 (MW), Urine - CMP14+EGFR - CBC with Differential/Platelet - ALPRAZolam (XANAX) 0.5 MG tablet; TAKE (1) TABLET TWICE A DAY.  Dispense: 60 tablet; Refill: 5   Labs pending Pt reviewed in [place] controlled database, contract and drug screen up dated today Health Maintenance reviewed Diet and exercise encouraged  Follow up plan: 6 months   [name], FNP

## 2019-07-26 NOTE — Addendum Note (Signed)
Addended by: [name] A on: 07/26/2019 02:47 PM   Modules accepted: Orders

## 2019-07-26 NOTE — Patient Instructions (Signed)
Shortness of Breath, Adult Shortness of breath is when a person has trouble breathing enough air or when a person feels like she or he is having trouble breathing in enough air. Shortness of breath could be a sign of a medical problem. Follow these instructions at home:   Pay attention to any changes in your symptoms.  Do not use any products that contain nicotine or tobacco, such as cigarettes, e-cigarettes, and chewing tobacco.  Do not smoke. Smoking is a common cause of shortness of breath. If you need help quitting, ask your health care provider.  Avoid things that can irritate your airways, such as: ? Mold. ? Dust. ? Air pollution. ? Chemical fumes. ? Things that can cause allergy symptoms (allergens), if you have allergies.  Keep your living space clean and free of mold and dust.  Rest as needed. Slowly return to your usual activities.  Take over-the-counter and prescription medicines only as told by your health care provider. This includes oxygen therapy and inhaled medicines.  Keep all follow-up visits as told by your health care provider. This is important. Contact a health care provider if:  Your condition does not improve as soon as expected.  You have a hard time doing your normal activities, even after you rest.  You have new symptoms. Get help right away if:  Your shortness of breath gets worse.  You have shortness of breath when you are resting.  You feel light-headed or you faint.  You have a cough that is not controlled with medicines.  You cough up blood.  You have pain with breathing.  You have pain in your chest, arms, shoulders, or abdomen.  You have a fever.  You cannot walk up stairs or exercise the way that you normally do. These symptoms may represent a serious problem that is an emergency. Do not wait to see if the symptoms will go away. Get medical help right away. Call your local emergency services (911 in the [place] Do not drive yourself  to the hospital. Summary  Shortness of breath is when a person has trouble breathing enough air. It can be a sign of a medical problem.  Avoid things that irritate your lungs, such as smoking, pollution, mold, and dust.  Pay attention to changes in your symptoms and contact your health care provider if you have a hard time completing daily activities because of shortness of breath. This information is not intended to replace advice given to you by your health care provider. Make sure you discuss any questions you have with your health care provider. Document Revised: 07/12/2017 Document Reviewed: 07/12/2017 Elsevier Patient Education  2020 [employer]  

## 2019-07-27 LAB — CBC WITH DIFFERENTIAL/PLATELET
Basophils Absolute: 0.1 10*3/uL (ref 0.0–0.2)
Basos: 1 %
EOS (ABSOLUTE): 0.3 10*3/uL (ref 0.0–0.4)
Eos: 2 %
Hematocrit: 37.4 % (ref 34.0–46.6)
Hemoglobin: 12.2 g/dL (ref 11.1–15.9)
Immature Grans (Abs): 0 10*3/uL (ref 0.0–0.1)
Immature Granulocytes: 0 %
Lymphocytes Absolute: 3.9 10*3/uL — ABNORMAL HIGH (ref 0.7–3.1)
Lymphs: 31 %
MCH: 26.9 pg (ref 26.6–33.0)
MCHC: 32.6 g/dL (ref 31.5–35.7)
MCV: 83 fL (ref 79–97)
Monocytes Absolute: 0.7 10*3/uL (ref 0.1–0.9)
Monocytes: 5 %
Neutrophils Absolute: 7.7 10*3/uL — ABNORMAL HIGH (ref 1.4–7.0)
Neutrophils: 61 %
Platelets: 352 10*3/uL (ref 150–450)
RBC: 4.53 x10E6/uL (ref 3.77–5.28)
RDW: 15.8 % — ABNORMAL HIGH (ref 11.7–15.4)
WBC: 12.6 10*3/uL — ABNORMAL HIGH (ref 3.4–10.8)

## 2019-07-27 LAB — LIPID PANEL
Chol/HDL Ratio: 3.8 ratio (ref 0.0–4.4)
Cholesterol, Total: 163 mg/dL (ref 100–199)
HDL: 43 mg/dL (ref >39)
LDL Chol Calc (NIH): 86 mg/dL (ref 0–99)
Triglycerides: 200 mg/dL — ABNORMAL HIGH (ref 0–149)
VLDL Cholesterol Cal: 34 mg/dL (ref 5–40)

## 2019-07-27 LAB — CMP14+EGFR
ALT: 35 IU/L — ABNORMAL HIGH (ref 0–32)
AST: 40 IU/L (ref 0–40)
Albumin/Globulin Ratio: 1.7 (ref 1.2–2.2)
Albumin: 4.7 g/dL (ref 3.7–4.7)
Alkaline Phosphatase: 71 IU/L (ref 48–121)
BUN/Creatinine Ratio: 19 (ref 12–28)
BUN: 17 mg/dL (ref 8–27)
Bilirubin Total: 1 mg/dL (ref 0.0–1.2)
CO2: 22 mmol/L (ref 20–29)
Calcium: 11 mg/dL — ABNORMAL HIGH (ref 8.7–10.3)
Chloride: 101 mmol/L (ref 96–106)
Creatinine, Ser: 0.9 mg/dL (ref 0.57–1.00)
GFR calc Af Amer: 71 mL/min/{1.73_m2} (ref >59)
GFR calc non Af Amer: 61 mL/min/{1.73_m2} (ref >59)
Globulin, Total: 2.7 g/dL (ref 1.5–4.5)
Glucose: 119 mg/dL — ABNORMAL HIGH (ref 65–99)
Potassium: 4.6 mmol/L (ref 3.5–5.2)
Sodium: 137 mmol/L (ref 134–144)
Total Protein: 7.4 g/dL (ref 6.0–8.5)

## 2019-07-27 LAB — TSH: TSH: 8.28 u[IU]/mL — ABNORMAL HIGH (ref 0.450–4.500)

## 2019-07-28 ENCOUNTER — Other Ambulatory Visit: Payer: Self-pay | Admitting: Family

## 2019-07-28 MED ORDER — LEVOTHYROXINE SODIUM 100 MCG PO TABS
100.0000 ug | ORAL_TABLET | Freq: Every day | ORAL | 11 refills | Status: DC
Start: 2019-07-28 — End: 2020-03-29

## 2019-07-29 DIAGNOSIS — R0602 Shortness of breath: Secondary | ICD-10-CM | POA: Diagnosis not present

## 2019-07-29 DIAGNOSIS — R069 Unspecified abnormalities of breathing: Secondary | ICD-10-CM | POA: Diagnosis not present

## 2019-07-29 LAB — TOXASSURE SELECT 13 (MW), URINE

## 2019-07-31 ENCOUNTER — Ambulatory Visit (INDEPENDENT_AMBULATORY_CARE_PROVIDER_SITE_OTHER): Payer: Medicare Other | Admitting: Nurse Practitioner

## 2019-07-31 ENCOUNTER — Other Ambulatory Visit: Payer: Self-pay | Admitting: Family

## 2019-07-31 DIAGNOSIS — R110 Nausea: Secondary | ICD-10-CM

## 2019-07-31 DIAGNOSIS — J449 Chronic obstructive pulmonary disease, unspecified: Secondary | ICD-10-CM | POA: Diagnosis not present

## 2019-07-31 MED ORDER — PREDNISONE 20 MG PO TABS: ORAL_TABLET | ORAL | 0 refills | Status: DC

## 2019-07-31 MED ORDER — ONDANSETRON HCL 4 MG PO TABS: 4.0000 mg | ORAL_TABLET | Freq: Three times a day (TID) | ORAL | 0 refills | Status: DC | PRN

## 2019-07-31 MED ORDER — BUDESONIDE-FORMOTEROL FUMARATE 160-4.5 MCG/ACT IN AERO: 2.0000 | INHALATION_SPRAY | Freq: Two times a day (BID) | RESPIRATORY_TRACT | 3 refills | Status: DC

## 2019-07-31 NOTE — Progress Notes (Signed)
Virtual Visit via telephone Note Due to COVID-19 pandemic this visit was conducted virtually. This visit type was conducted due to national recommendations for restrictions regarding the COVID-19 Pandemic (e.g. social distancing, sheltering in place) in an effort to limit this patient's exposure and mitigate transmission in our community. All issues noted in this document were discussed and addressed.  A physical exam was not performed with this format.  I connected with Sheila [name] on 07/31/19 at 3:35 by telephone and verified that I am speaking with the correct person using two identifiers. Sheila [name] is currently located at home and her daughter is currently with her during visit. The provider, Mary-Margaret Hassell Done, FNP is located in their office at time of visit.  I discussed the limitations, risks, security and privacy concerns of performing an evaluation and management service by telephone and the availability of in person appointments. I also discussed with the patient that there may be a patient responsible charge related to this service. The patient expressed understanding and agreed to proceed.   History and Present Illness:   Chief Complaint: Shortness of Breath   HPI Patient was seen on 07/28/19 for routine follow up. Her inhaler was changed from symbicort to [facility]. She ay that the symbicort works better for her then breo. Her breathing has been worse and he has been having to ue her nebulizer 3x a day. She wants to go back on symbicort. She also is c/o nausea and would like something for that.    Review of Systems  Respiratory: Positive for shortness of breath. Negative for cough.   Cardiovascular: Negative for chest pain, palpitations and leg swelling.  Gastrointestinal: Positive for nausea. Negative for vomiting.  Genitourinary: Negative.   All other systems reviewed and are negative.    Observations/Objective: Alert and oriented- answers all questions  appropriately No distress  *spoke with daughetr throughout most of converation  Assessment and Plan: Sheila [name] in today with chief complaint of Shortness of Breath   1. Chronic obstructive pulmonary disease, unspecified COPD type ([place]) Top BREO Only use albuterol neb as needed Follow up with [facility] in 3 day to recheck breathing - budesonide-formoterol (SYMBICORT) 160-4.5 MCG/ACT inhaler; Inhale 2 puffs into the lungs 2 (two) times daily.  Dispense: 1 Inhaler; Refill: 3 - predniSONE (DELTASONE) 20 MG tablet; 2 po at sametime daily for 5 days  Dispense: 10 tablet; Refill: 0  2. Nausea Bland diet - ondansetron (ZOFRAN) 4 MG tablet; Take 1 tablet (4 mg total) by mouth every 8 (eight) hours as needed for nausea or vomiting.  Dispense: 20 tablet; Refill: 0    Follow Up Instructions: 3 days    I discussed the assessment and treatment plan with the patient. The patient was provided an opportunity to ask questions and all were answered. The patient agreed with the plan and demonstrated an understanding of the instructions.   The patient was advised to call back or seek an in-person evaluation if the symptoms worsen or if the condition fails to improve as anticipated.  The above assessment and management plan was discussed with the patient. The patient verbalized understanding of and has agreed to the management plan. Patient is aware to call the clinic if symptoms persist or worsen. Patient is aware when to return to the clinic for a follow-up visit. Patient educated on when it is appropriate to go to the emergency department.   Time call ended:  3:50  I provided 15 minutes of non-face-to-face  time during this encounter.    Mary-Margaret Hassell Done, FNP

## 2019-08-03 ENCOUNTER — Other Ambulatory Visit: Payer: Self-pay

## 2019-08-03 ENCOUNTER — Ambulatory Visit: Payer: Medicare Other

## 2019-08-03 ENCOUNTER — Ambulatory Visit (INDEPENDENT_AMBULATORY_CARE_PROVIDER_SITE_OTHER): Payer: Medicare Other

## 2019-08-03 ENCOUNTER — Ambulatory Visit (INDEPENDENT_AMBULATORY_CARE_PROVIDER_SITE_OTHER): Payer: Medicare Other | Admitting: Family

## 2019-08-03 ENCOUNTER — Encounter: Payer: Self-pay | Admitting: Family

## 2019-08-03 VITALS — BP 153/98 | HR 104 | Temp 97.8°F | Ht 64.0 in | Wt 227.0 lb

## 2019-08-03 DIAGNOSIS — J90 Pleural effusion, not elsewhere classified: Secondary | ICD-10-CM | POA: Diagnosis not present

## 2019-08-03 DIAGNOSIS — J441 Chronic obstructive pulmonary disease with (acute) exacerbation: Secondary | ICD-10-CM

## 2019-08-03 DIAGNOSIS — I517 Cardiomegaly: Secondary | ICD-10-CM | POA: Diagnosis not present

## 2019-08-03 DIAGNOSIS — J449 Chronic obstructive pulmonary disease, unspecified: Secondary | ICD-10-CM | POA: Diagnosis not present

## 2019-08-03 MED ORDER — PREDNISONE 10 MG (21) PO TBPK: ORAL_TABLET | ORAL | 0 refills | Status: DC

## 2019-08-03 MED ORDER — AZITHROMYCIN 250 MG PO TABS: ORAL_TABLET | ORAL | 0 refills | Status: DC

## 2019-08-03 NOTE — Patient Instructions (Signed)

## 2019-08-03 NOTE — Progress Notes (Signed)
Subjective:    Patient ID: Sheila [name], female    DOB: [birth year], 79 y.o.   MRN: [id number]  Chief Complaint  Patient presents with  . Wheezing    wHEN OUTSIDE AND WALKS CAN NOT BREATHING   . Shortness of Breath  . Sore Throat  . Abdominal Pain    AFTER SHE EATS 30 MIN LATER SOMACH HURTS (GLUTEN ALLERGY?)    Wheezing  This is a recurrent problem. The current episode started in the past 7 days. The problem occurs intermittently. Associated symptoms include coughing, shortness of breath and a sore throat ("getting better"). Pertinent negatives include no chills, ear pain, fever, headaches or sputum production. The symptoms are aggravated by pollens and weather changes. She has tried oral steroids and OTC cough suppressant for the symptoms. The treatment provided mild relief. Her past medical history is significant for COPD.      Review of Systems  Constitutional: Negative for chills and fever.  HENT: Positive for sore throat ("getting better"). Negative for ear pain.   Respiratory: Positive for cough, shortness of breath and wheezing. Negative for sputum production.   Neurological: Negative for headaches.  All other systems reviewed and are negative.      Objective:   Physical Exam Vitals reviewed.  Constitutional:      General: She is not in acute distress.    Appearance: She is well-developed. She is obese.  HENT:     Head: Normocephalic and atraumatic.     Right Ear: External ear normal.  Eyes:     Pupils: Pupils are equal, round, and reactive to light.  Neck:     Thyroid: No thyromegaly.  Cardiovascular:     Rate and Rhythm: Normal rate and regular rhythm.     Heart sounds: Normal heart sounds. No murmur heard.   Pulmonary:     Effort: Pulmonary effort is normal. No respiratory distress.     Breath sounds: Wheezing present.  Abdominal:     General: Bowel sounds are normal. There is no distension.     Palpations: Abdomen is soft.     Tenderness: There is no  abdominal tenderness.  Musculoskeletal:        General: No tenderness.     Cervical back: Normal range of motion and neck supple.  Skin:    General: Skin is warm and dry.  Neurological:     Mental Status: She is alert and oriented to person, place, and time.     Cranial Nerves: No cranial nerve deficit.     Motor: Weakness present.     Deep Tendon Reflexes: Reflexes are normal and symmetric.  Psychiatric:        Behavior: Behavior normal.        Thought Content: Thought content normal.        Judgment: Judgment normal.       BP (!) 153/98   Pulse (!) 104   Temp 97.8 F (36.6 C) (Temporal)   Ht 5\' 4"  (1.626 m)   Wt 227 lb (103 kg)   SpO2 97%   BMI 38.96 kg/m      Assessment & Plan:  Sheila [name] comes in today with chief complaint of Wheezing (wHEN OUTSIDE AND WALKS CAN NOT BREATHING ), Shortness of Breath, Sore Throat, and Abdominal Pain (AFTER SHE EATS 30 MIN LATER SOMACH HURTS (GLUTEN ALLERGY?))   Diagnosis and orders addressed:  1. Chronic obstructive pulmonary disease with acute exacerbation (HCC) Rest Force fluids Zpak and prednisone Continue  Symbicort, but increase to BID Continue albuterol as needed RTO if symptoms worsen or do not improve - DG Chest 2 View; Future - predniSONE (STERAPRED UNI-PAK 21 TAB) 10 MG (21) TBPK tablet; Use as directed  Dispense: 21 tablet; Refill: 0 - azithromycin (ZITHROMAX) 250 MG tablet; Take 500 mg once, then 250 mg for four days  Dispense: 6 tablet; Refill: 0   [name], FNP

## 2019-08-08 ENCOUNTER — Other Ambulatory Visit: Payer: Self-pay | Admitting: Family

## 2019-08-08 DIAGNOSIS — I429 Cardiomyopathy, unspecified: Secondary | ICD-10-CM

## 2019-08-08 DIAGNOSIS — R0602 Shortness of breath: Secondary | ICD-10-CM

## 2019-08-08 MED ORDER — FUROSEMIDE 20 MG PO TABS: 20.0000 mg | ORAL_TABLET | Freq: Every day | ORAL | 0 refills | Status: DC

## 2019-08-14 ENCOUNTER — Ambulatory Visit (INDEPENDENT_AMBULATORY_CARE_PROVIDER_SITE_OTHER): Payer: Medicare Other | Admitting: Gastroenterology

## 2019-08-22 ENCOUNTER — Ambulatory Visit (INDEPENDENT_AMBULATORY_CARE_PROVIDER_SITE_OTHER): Payer: Medicare Other | Admitting: Family

## 2019-08-22 ENCOUNTER — Encounter: Payer: Self-pay | Admitting: Family

## 2019-08-22 ENCOUNTER — Other Ambulatory Visit: Payer: Self-pay

## 2019-08-22 VITALS — BP 151/89 | HR 95 | Temp 97.1°F | Ht 59.0 in | Wt 227.0 lb

## 2019-08-22 DIAGNOSIS — R0602 Shortness of breath: Secondary | ICD-10-CM

## 2019-08-22 DIAGNOSIS — J42 Unspecified chronic bronchitis: Secondary | ICD-10-CM

## 2019-08-22 DIAGNOSIS — I517 Cardiomegaly: Secondary | ICD-10-CM

## 2019-08-22 NOTE — Progress Notes (Signed)
   Subjective:    Patient ID: Sheila [name], female    DOB: Dec 27, 1940, 79 y.o.   MRN: [id number]  Chief Complaint  Patient presents with  . COPD   Pt presents to the office today to recheck SOB, COPD. She had a chest x-ray that showed enlarged heart. She was started on lasix 20 mg BID. Her weight is stable. She states she can not tell a difference in her swelling.   She has an appt with Cardiologists on 08/29/19.  COPD She complains of chest tightness, cough ("every once in awhile") and hoarse voice. There is no difficulty breathing, frequent throat clearing or shortness of breath. This is a chronic problem. The current episode started more than 1 year ago. The problem occurs intermittently. Her past medical history is significant for COPD.      Review of Systems  HENT: Positive for hoarse voice.   Respiratory: Positive for cough ("every once in awhile"). Negative for shortness of breath.   All other systems reviewed and are negative.      Objective:   Physical Exam Vitals reviewed.  Constitutional:      General: She is not in acute distress.    Appearance: She is well-developed. She is obese.  HENT:     Head: Normocephalic and atraumatic.     Right Ear: Tympanic membrane normal.     Left Ear: Tympanic membrane normal.  Eyes:     Pupils: Pupils are equal, round, and reactive to light.  Neck:     Thyroid: No thyromegaly.  Cardiovascular:     Rate and Rhythm: Normal rate and regular rhythm.     Heart sounds: Normal heart sounds. No murmur heard.   Pulmonary:     Effort: Pulmonary effort is normal. No respiratory distress.     Breath sounds: Normal breath sounds. No wheezing.  Abdominal:     General: Bowel sounds are normal. There is no distension.     Palpations: Abdomen is soft.     Tenderness: There is no abdominal tenderness.  Musculoskeletal:     Cervical back: Normal range of motion and neck supple.     Comments: Using rolling walker, generalized weakness   Skin:    General: Skin is warm and dry.  Neurological:     Mental Status: She is alert and oriented to person, place, and time.     Cranial Nerves: No cranial nerve deficit.     Deep Tendon Reflexes: Reflexes are normal and symmetric.  Psychiatric:        Behavior: Behavior normal.        Thought Content: Thought content normal.        Judgment: Judgment normal.        BP (!) 151/89   Pulse 95   Temp (!) 97.1 F (36.2 C) (Temporal)   Ht '5\' 4"'$  (1.626 m)   Wt 227 lb (103 kg)   SpO2 98%   BMI 38.96 kg/m   Assessment & Plan:  Sheila [name] comes in today with chief complaint of COPD   Diagnosis and orders addressed:  1. SOB (shortness of breath) - BMP8+EGFR  2. Chronic bronchitis, unspecified chronic bronchitis type (HCC) - BMP8+EGFR  3. Cardiomegaly - BMP8+EGFR   Labs pending SOB improved Weight stable  Continue Lasix once daily Keep Cardiologists follow up   [name], FNP

## 2019-08-22 NOTE — Patient Instructions (Addendum)
Cardiomyopathy, Adult  Cardiomyopathy is a long-term (chronic) disease of the heart muscle. The disease makes the heart muscle thick, weak, or stiff. As a result, the heart works harder to pump blood. Over time, cardiomyopathy can lead to an irregular heartbeat (arrhythmia) and heart failure. There are several types of cardiomyopathy. The kind of cardiomyopathy that you have depends on what part of the heart is affected and how it is affected. What are the causes? This condition may be caused by:  A medical condition that damages the heart, such as diabetes, high blood pressure, or heart attack.  Diseases of the immune system, connective tissues, or endocrine system.  Alcohol abuse.  Using illegal drugs.  Taking certain medicines.  Pregnancy.  Too much iron in the body (hemochromatosis).  Cancer treatments.  Protein buildup in the organs (amyloidosis), or inflammation in the organs (sarcoidosis). In some cases, the cause is not known. What increases the risk? You are more likely to develop this condition if:  You have a gene that is passed down (inherited) from a family member.  You are overweight or obese. What are the signs or symptoms? Often, people with this condition have no symptoms. If you do have symptoms, this may include:  Shortness of breath, especially during activity.  Fatigue.  An irregular heartbeat.  Feeling dizzy or light-headed.  Fainting.  Chest pain.  Coughing.  Swelling of the lower legs, feet, abdomen, or neck veins. How is this diagnosed? This condition is diagnosed based on:  Your symptoms and medical history.  A physical exam.  Blood tests and imaging studies, such as X-ray, echocardiogram, or MRI.  Other tests, such as: ? An electrocardiogram (ECG). ? A portable heart monitor that records your heart's electrical activity (event monitor). ? A stress test. ? Cardiac catheterization and coronary angiogram. ? Heart tissue  biopsy. How is this treated? Your treatment depends on the type and severity of your symptoms. If you do not have symptoms, you may not need treatment. Treatment may include:  General healthy lifestyle changes.  Medicines to: ? Treat high blood pressure, abnormal heart rate, or inflammation. ? Clear excess fluids from your body. ? Prevent blood clots. ? Balance minerals (electrolytes) in your body and get rid of extra sodium in your body. ? Strengthen your heartbeat.  Surgery to: ? Repair a heart defect, remove heart tissue, or destroy tissues in the area of abnormal electrical activity (ablation). ? Implant a device to treat serious heart rhythm problems or to restore the heart's ability to pump blood. ? Replace your heart with a healthy heart. This is done if all other treatments have failed. Other treatments may include:  Cardiac resynchronization therapy (CRT). This restores the heart's normal beating. Follow these instructions at home: Lifestyle   Eat a heart-healthy diet that includes plenty of fruits, vegetables, whole grains, and foods that are low in salt (sodium).  Maintain a healthy weight.  Stay physically active. Ask your health care provider what activities are safe for you.  Do not use any products that contain nicotine or tobacco, such as cigarettes, e-cigarettes, and chewing tobacco. If you need help quitting, ask your health care provider.  Try to get at least 7 hours of sleep each night.  Find healthy ways to manage stress. Alcohol use  Do not drink alcohol if: ? Your health care provider tells you not to drink. ? You are pregnant, may be pregnant, or are planning to become pregnant. If you drink alcohol:  Limit how   much you use to: ? 0-1 drink a day for women. ? 0-2 drinks a day for men.  Be aware of how much alcohol is in your drink. In the [place] one drink equals one 12 oz bottle of beer (355 mL), one 5 oz glass of wine (148 mL), or one 1 oz glass  of hard liquor (44 mL). General instructions  Take over-the-counter and prescription medicines only as told by your health care provider. Some medicines can be dangerous for your heart.  If you are prescribed a blood thinner, make sure you understand what to do in a bleeding situation.  Tell all health care providers, including your dentist, that you have cardiomyopathy. Ask your health care provider if you need antibiotics before having dental care or before surgery.  Ask your health care provider if you should wear a medical identification bracelet. This may be important if you have a pacemaker or a defibrillator.  Get all needed vaccines. Get a flu shot every year.  Work closely with your health care provider to manage any chronic conditions.  If you plan to start a family, talk to a genetic counselor to discuss the risk of having a child with cardiomyopathy.  Keep all follow-up visits as told by your health care provider. This is important, even if you do not have any symptoms. Your health care provider may need to make sure your condition is not getting worse. Contact a health care provider if:  Your symptoms get worse.  You have new symptoms. Get help right away if you:  Have severe chest pain.  Have shortness of breath.  Cough up a pink, bubbly substance.  Feel nauseous and you vomit.  Suddenly become light-headed or dizzy.  Feel your heart beating very quickly.  Feel like your heart is skipping beats. These symptoms may represent a serious problem that is an emergency. Do not wait to see if the symptoms will go away. Get medical help right away. Call your local emergency services (911 in the [place] Do not drive yourself to the hospital. Summary  Cardiomyopathy is a long-term (chronic) disease of the heart muscle.  Over time, cardiomyopathy can lead to an irregular heartbeat (arrhythmia) and heart failure.  A number of treatments are available for this condition.  Your treatment will depend on the type and severity of your symptoms.  Follow your health care provider's instructions on diet, medicines, physical activity, and when to seek help. This information is not intended to replace advice given to you by your health care provider. Make sure you discuss any questions you have with your health care provider. Document Revised: 04/14/2018 Document Reviewed: 04/14/2018 Elsevier Patient Education  2020 [employer] Inc[employer]  

## 2019-08-23 ENCOUNTER — Ambulatory Visit (INDEPENDENT_AMBULATORY_CARE_PROVIDER_SITE_OTHER): Payer: Medicare Other | Admitting: Licensed Clinical Social Worker

## 2019-08-23 ENCOUNTER — Other Ambulatory Visit (INDEPENDENT_AMBULATORY_CARE_PROVIDER_SITE_OTHER): Payer: Self-pay | Admitting: Internal Medicine

## 2019-08-23 DIAGNOSIS — E785 Hyperlipidemia, unspecified: Secondary | ICD-10-CM | POA: Diagnosis not present

## 2019-08-23 DIAGNOSIS — M19011 Primary osteoarthritis, right shoulder: Secondary | ICD-10-CM | POA: Diagnosis not present

## 2019-08-23 DIAGNOSIS — J441 Chronic obstructive pulmonary disease with (acute) exacerbation: Secondary | ICD-10-CM | POA: Diagnosis not present

## 2019-08-23 DIAGNOSIS — F411 Generalized anxiety disorder: Secondary | ICD-10-CM

## 2019-08-23 DIAGNOSIS — M170 Bilateral primary osteoarthritis of knee: Secondary | ICD-10-CM | POA: Diagnosis not present

## 2019-08-23 DIAGNOSIS — I10 Essential (primary) hypertension: Secondary | ICD-10-CM

## 2019-08-23 LAB — BMP8+EGFR
BUN/Creatinine Ratio: 18 (ref 12–28)
BUN: 15 mg/dL (ref 8–27)
CO2: 25 mmol/L (ref 20–29)
Calcium: 10.3 mg/dL (ref 8.7–10.3)
Chloride: 102 mmol/L (ref 96–106)
Creatinine, Ser: 0.84 mg/dL (ref 0.57–1.00)
GFR calc Af Amer: 77 mL/min/{1.73_m2} (ref >59)
GFR calc non Af Amer: 67 mL/min/{1.73_m2} (ref >59)
Glucose: 123 mg/dL — ABNORMAL HIGH (ref 65–99)
Potassium: 4.2 mmol/L (ref 3.5–5.2)
Sodium: 142 mmol/L (ref 134–144)

## 2019-08-23 NOTE — Chronic Care Management (AMB) (Addendum)
Chronic Care Management    Clinical Social Work Follow Up Note  08/23/2019 Name: Sheila [name] MRN: [id number] DOB: [birth year]  Sheila [name] is a 79 y.o. year old female who is a primary care patient of Sharion Balloon, FNP. The CCM team was consulted for assistance with [employer] .   Review of patient status, including review of consultants reports, other relevant assessments, and collaboration with appropriate care team members and the patient's provider was performed as part of comprehensive patient evaluation and provision of chronic care management services.    SDOH (Social Determinants of Health) assessments performed: No;risk for social isolation; risk for tobacco use; risk for financial strain; risk for depression; risk for physical inactivity    Office Visit from 07/26/2019 in [place]  PHQ-9 Total Score 6         GAD 7 : Generalized Anxiety Score 07/26/2019 02/22/2019 05/05/2018 04/29/2015  Nervous, Anxious, on Edge 1 1 1 1   Control/stop worrying 0 1 0 0  Worry too much - different things 0 1 0 0  Trouble relaxing 0 1 0 0  Restless 0 1 0 0  Easily annoyed or irritable 0 0 0 0  Afraid - awful might happen 0 1 0 0  Total GAD 7 Score 1 6 1 1   Anxiety Difficulty Not difficult at all Somewhat difficult Not difficult at all -    Outpatient Encounter Medications as of 08/23/2019  Medication Sig   albuterol (PROVENTIL) (2.5 MG/3ML) 0.083% nebulizer solution NEBULIZE 1 VIAL EVERY 6-8 HOURS AS NEEDED FOR WHEEZING OR SHORTNESS OF BREATH   ALPRAZolam (XANAX) 0.5 MG tablet TAKE (1) TABLET TWICE A DAY.   budesonide-formoterol (SYMBICORT) 160-4.5 MCG/ACT inhaler Inhale 2 puffs into the lungs 2 (two) times daily.   cetirizine (ZYRTEC) 10 MG tablet Take 1 tablet (10 mg total) by mouth daily.   Cholecalciferol (VITAMIN D3) 25 MCG (1000 UT) CAPS Take by mouth.   diclofenac sodium (VOLTAREN) 1 % GEL Apply 2 g topically 4 (four) times daily.   DULoxetine  (CYMBALTA) 60 MG capsule Take 1 capsule (60 mg total) by mouth daily.   fluticasone (FLONASE) 50 MCG/ACT nasal spray Place 2 sprays into both nostrils daily.   furosemide (LASIX) 20 MG tablet Take 1 tablet (20 mg total) by mouth daily.   gabapentin (NEURONTIN) 300 MG capsule TAKE (1) CAPSULE THREE TIMES DAILY.   hydrOXYzine (ATARAX/VISTARIL) 10 MG tablet TAKE  (1)  TABLET  THREE TIMES DAILY AS NEEDED.   levocetirizine (XYZAL) 5 MG tablet TAKE 1 TABLET ONCE DAILY IN THE EVENING   levothyroxine (SYNTHROID) 100 MCG tablet Take 1 tablet (100 mcg total) by mouth daily.   LIVALO 2 MG TABS TAKE 1 TABLET EVERY DAY   meclizine (ANTIVERT) 25 MG tablet TAKE (1) TABLET THREE TIMES DAILY AS NEEDED FOR DIZZINESS.   Multiple Vitamins-Minerals (CENTRUM SILVER ADULT 50+ PO) Take 1 tablet by mouth every other day.   Olopatadine HCl 0.2 % SOLN Apply 2 drops to eye daily.   Omega-3 Fatty Acids (FISH OIL OMEGA-3 PO) Take by mouth.   ondansetron (ZOFRAN) 4 MG tablet Take 1 tablet (4 mg total) by mouth every 8 (eight) hours as needed for nausea or vomiting.   pantoprazole (PROTONIX) 20 MG tablet TAKE 1 TABLET DAILY   potassium chloride SA (KLOR-CON) 20 MEQ tablet TAKE (1) TABLET TWICE A DAY.   predniSONE (STERAPRED UNI-PAK 21 TAB) 10 MG (21) TBPK tablet Use as directed   triamcinolone  ointment (KENALOG) 0.5 % Apply 1 application topically 2 (two) times daily.   trimethoprim-polymyxin b (POLYTRIM) ophthalmic solution Place 1 drop into the left eye every 6 (six) hours.   Facility-Administered Encounter Medications as of 08/23/2019  Medication   denosumab (PROLIA) injection 60 mg   Goals       Client will talk with LCSW about social work needs of client (pt-stated)      Current Barriers:  Ambulation challenges COPD Breathing challenges in client with Chronic Diagnoses of HTN, DJD, HLD, GAD, Osteoarthritis,and COPD  Clinical Social Work Clinical Goal(s):  LCSW will talk with client in next 30 days to discuss the  social work needs of client at that time  Interventions: Talked with Sheila [name], niece, about current needs of client  Talked with Sheila [name] about ambulation challenges of client  ( client uses a walker to help her walk) Talked with with Sheila [name] about client's upcoming appointments Talked with Sheila [name] about vision challenges of client Talked with Sheila [name] about hearing issues of client Talked with Sheila [name] about client use of ramp at home to go in and out of home Talked with Sheila [name] about transport needs of client Talked with Sheila [name] about social support network for client Talked with Sheila [name] about in home support for client (in home aide helps client in the home as scheduled 5 days weekly) Talked with Sheila [name] about food procurement for client Talked with Sheila [name] about CCM program services Encouraged client/or Sheila [name] to call RNCM as needed to discuss nursing needs of client Talked with Sheila [name] about memory challenges of client Collaborated with RNCM about nursing needs of client and talked with [facility] about hearing needs of client   Patient Self Care Activities:  Self administers medications as prescribed Attends all scheduled provider appointments  Patient Self Care Deficits Unable to perform IADLs independently  Initial goal documentation     Follow Up Plan: LCSW to call client/Sheila [name], niece, in next 4 weeks to talk with client Sheila [name] about the social work needs of client at that time  Norva Riffle.Luchiano Viscomi MSW, LCSW Licensed Clinical Social Worker [facility] Care Management [phone]  I have reviewed the CCM documentation and agree with the written assessment and plan of care.  [name], FNP

## 2019-08-23 NOTE — Patient Instructions (Addendum)
Licensed Clinical Social Worker Visit Information  Goals we discussed today:    .  Client will talk with LCSW about social work needs of client (pt-stated)        Current Barriers:   Ambulation challenges  COPD  Breathing challenges in client with Chronic Diagnoses of HTN, DJD, HLD, GAD, Osteoarthritis,and COPD  Clinical Social Work Clinical Goal(s):   LCSW will talk with client in next 30 days to discuss the social work needs of client at that time  Interventions:  Talked with [name], niece, about current needs of client   Talked with [name] about ambulation challenges of client  ( client uses a walker to help her walk)  Talked with with [name] about client's upcoming appointments  Talked with [name] about vision challenges of client  Talked with [name] about hearing issues of client  Talked with [name] about client use of ramp at home to go in and out of home  Talked with [name] about transport needs of client  Talked with [name] about social support network for client  Talked with [name] about in home support for client (in home aide helps client in the home as scheduled 5 days weekly)  Talked with [name] about food procurement for client  Talked with [name] about CCM program services  Encouraged client/or [name] to call RNCM as needed to discuss nursing needs of client  Talked with [name] about memory challenges of client  Collaborated with RNCM about nursing needs of client and talked with [facility] about hearing needs of client  Patient Self Care Activities:   Self administers medications as prescribed  Attends all scheduled provider appointments   Patient Self Care Deficits  Unable to perform IADLs independently  Initial goal documentation     Follow Up Plan: LCSW to call client/Denise Mason Jim, niece, in next 4 weeks to talk with client [name] about the social work needs of client at that time  Materials Provided: No  The  patient [employer], niece,verbalized understanding of instructions provided today and declined a print copy of patient instruction materials.   Norva Riffle.Jaxston Chohan MSW, LCSW Licensed Clinical Social Worker [facility] [phone]

## 2019-08-25 DIAGNOSIS — M545 Low back pain: Secondary | ICD-10-CM | POA: Diagnosis not present

## 2019-08-25 DIAGNOSIS — R52 Pain, unspecified: Secondary | ICD-10-CM | POA: Diagnosis not present

## 2019-08-25 DIAGNOSIS — R0902 Hypoxemia: Secondary | ICD-10-CM | POA: Diagnosis not present

## 2019-08-25 DIAGNOSIS — W19XXXA Unspecified fall, initial encounter: Secondary | ICD-10-CM | POA: Diagnosis not present

## 2019-08-29 ENCOUNTER — Encounter: Payer: Self-pay | Admitting: Cardiovascular Disease

## 2019-08-29 ENCOUNTER — Ambulatory Visit (INDEPENDENT_AMBULATORY_CARE_PROVIDER_SITE_OTHER): Payer: Medicare Other | Admitting: Cardiovascular Disease

## 2019-08-29 ENCOUNTER — Other Ambulatory Visit: Payer: Self-pay

## 2019-08-29 DIAGNOSIS — I10 Essential (primary) hypertension: Secondary | ICD-10-CM

## 2019-08-29 DIAGNOSIS — R0602 Shortness of breath: Secondary | ICD-10-CM | POA: Diagnosis not present

## 2019-08-29 DIAGNOSIS — R0789 Other chest pain: Secondary | ICD-10-CM | POA: Insufficient documentation

## 2019-08-29 DIAGNOSIS — E782 Mixed hyperlipidemia: Secondary | ICD-10-CM | POA: Diagnosis not present

## 2019-08-29 NOTE — Patient Instructions (Addendum)
Medication Instructions:  Your physician recommends that you continue on your current medications as directed. Please refer to the Current Medication list given to you today.  *If you need a refill on your cardiac medications before your next appointment, please call your pharmacy*  Lab Work: NONE   Testing/Procedures: Your physician has requested that you have an echocardiogram. Echocardiography is a painless test that uses sound waves to create images of your heart. It provides your doctor with information about the size and shape of your heart and how well your heart's chambers and valves are working. This procedure takes approximately one hour. There are no restrictions for this procedure.  CALCIUM SCORE - THIS WILL COST $150 OUT OF POCKET   Follow-Up: At [facility], you and your health needs are our priority.  As part of our continuing mission to provide you with exceptional heart care, we have created designated Provider Care Teams.  These Care Teams include your primary Cardiologist (physician) and Advanced Practice Providers (APPs -  Physician Assistants and Nurse Practitioners) who all work together to provide you with the care you need, when you need it.  We recommend signing up for the patient portal called "MyChart".  Sign up information is provided on this After Visit Summary.  MyChart is used to connect with patients for Virtual Visits (Telemedicine).  Patients are able to view lab/test results, encounter notes, upcoming appointments, etc.  Non-urgent messages can be sent to your provider as well.   To learn more about what you can do with MyChart, go to [email or web address].    Your next appointment:   8 week(s)  The format for your next appointment:   In Person  Provider:   You may see DR Gwenlyn Found  or one of the following Advanced Practice Providers on your designated Care Team:    Kerin Ransom, PA-C  [place], [place]  Coletta Memos, [place]  Your physician  recommends that you schedule a follow-up appointment in: PHARM D 1 MONTH FOR BLOOD PRESSURE  Other Instructions:  MONITOR AND LOG YOUR BLOOD PRESSURE DAILY  BRING READINGS TO FOLLOW UP IN 1 MONTH

## 2019-08-29 NOTE — Assessment & Plan Note (Signed)
History of hyperlipidemia on Livalo with lipid profile performed 07/26/2019 revealing total cholesterol 163, LDL of 86 and HDL 43.

## 2019-08-29 NOTE — Assessment & Plan Note (Signed)
She is a diagnosis of COPD but never smoked.  She did work in a Equities trader for years.  She is on inhaled bronchodilators.  Sheila [name] get a 2D echo to further evaluate

## 2019-08-29 NOTE — Assessment & Plan Note (Signed)
History of essential hypertension on no antihypertensive medications today.  Blood pressure is 154/95.  Of asked her to keep a blood pressure log for next 30 days which will be reviewed by one of our Pharm.D.'s before recommendations for antihypertensive medications are made.

## 2019-08-29 NOTE — Progress Notes (Signed)
08/29/2019 Sheila [name]   1940-04-04  [id number]  Primary Physician Sheila Balloon, FNP Primary Cardiologist: Lorretta Harp MD Sheila [name], [place]  HPI:  Sheila [name] is a 79 y.o. moderately overweight widowed Caucasian female mother of 2 children, grandmother of 3 grandchildren who is accompanied by her daughter Sheila [name] today.  She is referred by Sheila Dun FNP for evaluation of chest pain and shortness of breath.  She does have a history of having worked in a [employer] when she was younger.  She is never smoked but does not have a diagnosis of COPD.  She has hyperlipidemia on Livalo.  She is never had a heart attack or stroke.  She complains of episodic shortness of breath, dyspnea on exertion and atypical chest pain.   Current Meds  Medication Sig  . albuterol (PROVENTIL) (2.5 MG/3ML) 0.083% nebulizer solution NEBULIZE 1 VIAL EVERY 6-8 HOURS AS NEEDED FOR WHEEZING OR SHORTNESS OF BREATH  . ALPRAZolam (XANAX) 0.5 MG tablet TAKE (1) TABLET TWICE A DAY.  [name] BREO ELLIPTA 200-25 MCG/INH AEPB Inhale 1 puff into the lungs daily.  . budesonide-formoterol (SYMBICORT) 160-4.5 MCG/ACT inhaler Inhale 2 puffs into the lungs 2 (two) times daily.  . cetirizine (ZYRTEC) 10 MG tablet Take 1 tablet (10 mg total) by mouth daily.  . Cholecalciferol (VITAMIN D3) 25 MCG (1000 UT) CAPS Take by mouth.  . diclofenac sodium (VOLTAREN) 1 % GEL Apply 2 g topically 4 (four) times daily.  . DULoxetine (CYMBALTA) 60 MG capsule Take 1 capsule (60 mg total) by mouth daily.  . fluticasone (FLONASE) 50 MCG/ACT nasal spray Place 2 sprays into both nostrils daily.  . furosemide (LASIX) 20 MG tablet Take 1 tablet (20 mg total) by mouth daily.  [name] gabapentin (NEURONTIN) 300 MG capsule TAKE (1) CAPSULE THREE TIMES DAILY.  . hydrOXYzine (ATARAX/VISTARIL) 10 MG tablet TAKE  (1)  TABLET  THREE TIMES DAILY AS NEEDED.  [name] levocetirizine (XYZAL) 5 MG tablet TAKE 1 TABLET ONCE DAILY IN THE EVENING  . levothyroxine  (SYNTHROID) 100 MCG tablet Take 1 tablet (100 mcg total) by mouth daily.  [name] LIVALO 2 MG TABS TAKE 1 TABLET EVERY DAY  . meclizine (ANTIVERT) 25 MG tablet TAKE (1) TABLET THREE TIMES DAILY AS NEEDED FOR DIZZINESS.  . Multiple Vitamins-Minerals (CENTRUM SILVER ADULT 50+ PO) Take 1 tablet by mouth every other day.  . Olopatadine HCl 0.2 % SOLN Apply 2 drops to eye daily.  . Omega-3 Fatty Acids (FISH OIL OMEGA-3 PO) Take by mouth.  . ondansetron (ZOFRAN) 4 MG tablet Take 1 tablet (4 mg total) by mouth every 8 (eight) hours as needed for nausea or vomiting.  . pantoprazole (PROTONIX) 20 MG tablet TAKE 1 TABLET DAILY. MUST MAKE APPOINTMENT FOR MORE REFILLS  . potassium chloride SA (KLOR-CON) 20 MEQ tablet TAKE (1) TABLET TWICE A DAY.  [name] predniSONE (STERAPRED UNI-PAK 21 TAB) 10 MG (21) TBPK tablet Use as directed  . triamcinolone ointment (KENALOG) 0.5 % Apply 1 application topically 2 (two) times daily.  [name] trimethoprim-polymyxin b (POLYTRIM) ophthalmic solution Place 1 drop into the left eye every 6 (six) hours.   Current Facility-Administered Medications for the 08/29/19 encounter (Office Visit) with Lorretta Harp, MD  Medication  . denosumab (PROLIA) injection 60 mg     Allergies  Allergen Reactions  . Ibuprofen     History of bleeding ulcers - contra-indicated   . Benadryl [Diphenhydramine Hcl] Other (See Comments)  Worsening depression   . Lipitor [Atorvastatin] Other (See Comments)    Body aches.  . Hydrocodone     Nausea and vomiting  . Codeine Nausea And Vomiting  . Morphine And Related Nausea And Vomiting  . Tdap [Tetanus-Diphth-Acell Pertussis] Swelling    Redness and localized swelling at injection site     Social History   Socioeconomic History  . Marital status: Widowed    Spouse name: Not on file  . Number of children: 2  . Years of education: 0  . Highest education level: Never attended school  Occupational History  . Occupation: retired  Tobacco Use  .  Smoking status: Former Smoker    Types: Cigarettes    Quit date: 02/24/1968    Years since quitting: 51.5  . Smokeless tobacco: Current User    Types: Snuff  . Tobacco comment: quit yrs and yrs ago when she was very young  Media planner  . Vaping Use: Never used  Substance and Sexual Activity  . Alcohol use: No  . Drug use: No  . Sexual activity: Not Currently    Birth control/protection: Post-menopausal  Other Topics Concern  . Not on file  Social History Narrative  . Not on file   Social Determinants of Health   Financial Resource Strain: Medium Risk  . Difficulty of Paying Living Expenses: Somewhat hard  Food Insecurity: No Food Insecurity  . Worried About [name] in the Last Year: Never true  . Ran Out of Food in the Last Year: Never true  Transportation Needs: No Transportation Needs  . Lack of Transportation (Medical): No  . Lack of Transportation (Non-Medical): No  Physical Activity: Insufficiently Active  . Days of Exercise per Week: 5 days  . Minutes of Exercise per Session: 20 min  Stress: No Stress Concern Present  . Feeling of Stress : Not at all  Social Connections: Socially Isolated  . Frequency of Communication with Friends and Family: More than three times a week  . Frequency of Social Gatherings with Friends and Family: More than three times a week  . Attends Religious Services: Never  . Active Member of Clubs or Organizations: No  . Attends Archivist Meetings: Never  . Marital Status: Widowed  Intimate Partner Violence: Not At Risk  . Fear of Current or Ex-Partner: No  . Emotionally Abused: No  . Physically Abused: No  . Sexually Abused: No     Review of Systems: General: negative for chills, fever, night sweats or weight changes.  Cardiovascular: negative for chest pain, dyspnea on exertion, edema, orthopnea, palpitations, paroxysmal nocturnal dyspnea or shortness of breath Dermatological: negative for rash Respiratory: negative  for cough or wheezing Urologic: negative for hematuria Abdominal: negative for nausea, vomiting, diarrhea, bright red blood per rectum, melena, or hematemesis Neurologic: negative for visual changes, syncope, or dizziness All other systems reviewed and are otherwise negative except as noted above.    Blood pressure (!) 154/95, [name] 95, weight 228 lb (103.4 kg), SpO2 98 %.  General appearance: alert and no distress Neck: no adenopathy, no carotid bruit, no JVD, supple, symmetrical, trachea midline and thyroid not enlarged, symmetric, no tenderness/mass/nodules Lungs: clear to auscultation bilaterally Heart: regular rate and rhythm, S1, S2 normal, no murmur, click, rub or gallop Extremities: Trace bilateral lower extremity edema Pulses: 2+ and symmetric Skin: Skin color, texture, turgor normal. No rashes or lesions Neurologic: Alert and oriented X 3, normal strength and tone. Normal symmetric reflexes. Normal coordination  and gait  EKG sinus rhythm at 95 with nonspecific ST and T wave changes with borderline voltage criteria for LVH.  I personally reviewed this EKG.  ASSESSMENT AND PLAN:   Hyperlipidemia History of hyperlipidemia on Livalo with lipid profile performed 07/26/2019 revealing total cholesterol 163, LDL of 86 and HDL 43.  Essential hypertension, benign History of essential hypertension on no antihypertensive medications today.  Blood pressure is 154/95.  Of asked her to keep a blood pressure log for next 30 days which will be reviewed by one of our Pharm.D.'s before recommendations for antihypertensive medications are made.  Shortness of breath She is a diagnosis of COPD but never smoked.  She did work in a Equities trader for years.  She is on inhaled bronchodilators.  Georgina Peer get a 2D echo to further evaluate  Atypical chest pain Atypical chest pain with positive risk factors.  Will get a coronary calcium score to further evaluate      Lorretta Harp MD [facility],  [facility] 08/29/2019 5:01 PM

## 2019-08-29 NOTE — Assessment & Plan Note (Signed)
Atypical chest pain with positive risk factors.  Will get a coronary calcium score to further evaluate

## 2019-09-08 ENCOUNTER — Telehealth: Payer: Self-pay | Admitting: Cardiovascular Disease

## 2019-09-08 NOTE — Telephone Encounter (Signed)
       I went in pt's chart to see who had called her today. It was Sheila [name] to schedule an appt with the Pharmacist

## 2019-09-08 NOTE — Telephone Encounter (Signed)
Left message for patient to call and schedule 1 month Pharm D visit and 6 week follow up with Dr. Gwenlyn Found

## 2019-09-10 DIAGNOSIS — R062 Wheezing: Secondary | ICD-10-CM | POA: Diagnosis not present

## 2019-09-10 DIAGNOSIS — R05 Cough: Secondary | ICD-10-CM | POA: Diagnosis not present

## 2019-09-10 DIAGNOSIS — J029 Acute pharyngitis, unspecified: Secondary | ICD-10-CM | POA: Diagnosis not present

## 2019-09-10 DIAGNOSIS — R110 Nausea: Secondary | ICD-10-CM | POA: Diagnosis not present

## 2019-09-10 DIAGNOSIS — R5383 Other fatigue: Secondary | ICD-10-CM | POA: Diagnosis not present

## 2019-09-20 ENCOUNTER — Ambulatory Visit (INDEPENDENT_AMBULATORY_CARE_PROVIDER_SITE_OTHER)
Admission: RE | Admit: 2019-09-20 | Discharge: 2019-09-20 | Disposition: A | Discharge status: Home / Self Care | Payer: Self-pay | Source: Ambulatory Visit | Attending: Cardiovascular Disease | Admitting: Cardiovascular Disease

## 2019-09-20 ENCOUNTER — Other Ambulatory Visit: Payer: Self-pay

## 2019-09-20 ENCOUNTER — Other Ambulatory Visit: Payer: Self-pay | Admitting: Family

## 2019-09-20 ENCOUNTER — Ambulatory Visit (HOSPITAL_COMMUNITY): Payer: Medicare Other | Attending: Cardiology

## 2019-09-20 DIAGNOSIS — R0789 Other chest pain: Secondary | ICD-10-CM

## 2019-09-20 DIAGNOSIS — I700 Atherosclerosis of aorta: Secondary | ICD-10-CM | POA: Diagnosis not present

## 2019-09-20 DIAGNOSIS — I10 Essential (primary) hypertension: Secondary | ICD-10-CM

## 2019-09-20 DIAGNOSIS — R0602 Shortness of breath: Secondary | ICD-10-CM | POA: Diagnosis not present

## 2019-09-20 DIAGNOSIS — G629 Polyneuropathy, unspecified: Secondary | ICD-10-CM

## 2019-09-21 ENCOUNTER — Other Ambulatory Visit: Payer: Self-pay | Admitting: Family

## 2019-09-21 DIAGNOSIS — F331 Major depressive disorder, recurrent, moderate: Secondary | ICD-10-CM

## 2019-09-21 DIAGNOSIS — F411 Generalized anxiety disorder: Secondary | ICD-10-CM

## 2019-09-21 DIAGNOSIS — G629 Polyneuropathy, unspecified: Secondary | ICD-10-CM

## 2019-09-21 LAB — ECHOCARDIOGRAM COMPLETE
Area-P 1/2: 3.72 cm2
MV M vel: 5.9 m/s
MV Peak grad: 139.2 mmHg
S' Lateral: 4.45 cm

## 2019-09-22 ENCOUNTER — Telehealth: Payer: Self-pay | Admitting: *Deleted

## 2019-09-22 ENCOUNTER — Telehealth: Payer: Medicare Other | Admitting: *Deleted

## 2019-09-22 DIAGNOSIS — J441 Chronic obstructive pulmonary disease with (acute) exacerbation: Secondary | ICD-10-CM

## 2019-09-22 DIAGNOSIS — R0602 Shortness of breath: Secondary | ICD-10-CM

## 2019-09-22 NOTE — Telephone Encounter (Signed)
  Chronic Care Management   Initial RN Outreach Note  09/22/2019 Name: Sheila [name] MRN: [id number] DOB: 09-12-1940  Referred by: Sharion Balloon, FNP Reason for referral : Chronic Care Management (Initial RN Outreach)   An unsuccessful Initial Telephone Visit with RN Care Manager. The patient was referred to the case management team for assistance with care management and care coordination and has talked with Theadore Nan, LCSW. She was referred to me to discuss nursing care needs and in particular her hearing loss and SOB.   She was referred to [facility] and had an appt scheduled in Jan 2021. It's unclear if she attended that appointment. She has had multiple visits with PCP and cardiology visit recently regarding SOB.   Follow Up Plan: A HIPPA compliant phone message was left for the patient providing contact information and requesting a return call.  The care management team will reach out to the patient again over the next 30 days.   Chong Sicilian, BSN, RN-BC Embedded Chronic Care Manager [facility] Direct Dial: [phone]

## 2019-09-22 NOTE — Telephone Encounter (Deleted)
  Chronic Care Management   Outreach Note  09/22/2019 Name: Sheila [name] MRN: [id number] DOB: 1940-11-09  Referred by: Sharion Balloon, FNP Reason for referral : Chronic Care Management (RN Initial Visit)   An unsuccessful Initial Telephone Visit with RN Care Manager was attempted today. The patient was referred to the case management team for assistance with care management and care coordination. Ms Duca is established with Theadore Nan, LCSW and is in need of an initial RN visit to discuss nursing care needs.   Follow Up Plan: A HIPPA compliant phone message was left for the patient providing contact information and requesting a return call.  The care management team will reach out to the patient again over the next 30 days.   Chong Sicilian, BSN, RN-BC Embedded Chronic Care Manager [facility] [facility] Direct Dial: [phone]

## 2019-09-22 NOTE — Telephone Encounter (Signed)
Erroneous duplicate encounter.

## 2019-09-27 ENCOUNTER — Telehealth: Payer: Self-pay

## 2019-09-28 ENCOUNTER — Other Ambulatory Visit: Payer: Self-pay

## 2019-09-28 ENCOUNTER — Ambulatory Visit (INDEPENDENT_AMBULATORY_CARE_PROVIDER_SITE_OTHER): Payer: Medicare Other | Admitting: Cardiovascular Disease

## 2019-09-28 ENCOUNTER — Encounter: Payer: Self-pay | Admitting: Cardiovascular Disease

## 2019-09-28 ENCOUNTER — Other Ambulatory Visit: Payer: Self-pay | Admitting: Family

## 2019-09-28 VITALS — BP 138/90 | HR 109 | Ht 60.0 in | Wt 227.4 lb

## 2019-09-28 DIAGNOSIS — I10 Essential (primary) hypertension: Secondary | ICD-10-CM | POA: Diagnosis not present

## 2019-09-28 DIAGNOSIS — Z01810 Encounter for preprocedural cardiovascular examination: Secondary | ICD-10-CM | POA: Diagnosis not present

## 2019-09-28 DIAGNOSIS — E782 Mixed hyperlipidemia: Secondary | ICD-10-CM

## 2019-09-28 DIAGNOSIS — R0602 Shortness of breath: Secondary | ICD-10-CM | POA: Diagnosis not present

## 2019-09-28 MED ORDER — CARVEDILOL 3.125 MG PO TABS: 3.1250 mg | ORAL_TABLET | Freq: Two times a day (BID) | ORAL | 3 refills | Status: DC

## 2019-09-28 NOTE — Progress Notes (Signed)
09/28/2019 Sheila [name]   1940-07-30  [id number]  Primary Physician Sharion Balloon, FNP Primary Cardiologist: Lorretta Harp MD [facility], [place], [place]  HPI:  Sheila [name] is a 79 y.o.  moderately overweight widowed Caucasian female mother of 2 children, grandmother of 3 grandchildren who is accompanied by her daughter Hilda Blades today.  She is referred by [name] FNP for evaluation of chest pain and shortness of breath.  I last saw her in the office 08/29/2019. She does have a history of having worked in a [employer] when she was younger.  She is never smoked but does not have a diagnosis of COPD.  She has hyperlipidemia on Livalo.  She is never had a heart attack or stroke.  She complains of episodic shortness of breath, dyspnea on exertion and atypical chest pain.  I performed 2D echocardiography on her on 09/20/2019 revealing EF of 20 to 25% with grade 3 diastolic dysfunction, moderate MR and TR as well as a coronary calcium score performed the same day which was 318 primarily in the LAD territory.  Based on this I recommended that we proceed with outpatient diagnostic coronary angiography to define her anatomy and physiology.   Current Meds  Medication Sig  . albuterol (PROVENTIL) (2.5 MG/3ML) 0.083% nebulizer solution NEBULIZE 1 VIAL EVERY 6-8 HOURS AS NEEDED FOR WHEEZING OR SHORTNESS OF BREATH  . ALPRAZolam (XANAX) 0.5 MG tablet TAKE (1) TABLET TWICE A DAY.  [name] BREO ELLIPTA 200-25 MCG/INH AEPB Inhale 1 puff into the lungs daily.  . budesonide-formoterol (SYMBICORT) 160-4.5 MCG/ACT inhaler Inhale 2 puffs into the lungs 2 (two) times daily.  . cetirizine (ZYRTEC) 10 MG tablet Take 1 tablet (10 mg total) by mouth daily.  . Cholecalciferol (VITAMIN D3) 25 MCG (1000 UT) CAPS Take by mouth.  . diclofenac sodium (VOLTAREN) 1 % GEL Apply 2 g topically 4 (four) times daily.  . DULoxetine (CYMBALTA) 60 MG capsule TAKE 1 CAPUSLE ONCE DAILY  . fluticasone (FLONASE) 50 MCG/ACT nasal spray  Place 2 sprays into both nostrils daily.  . furosemide (LASIX) 20 MG tablet Take 1 tablet (20 mg total) by mouth daily.  [name] gabapentin (NEURONTIN) 300 MG capsule TAKE (1) CAPSULE THREE TIMES DAILY.  . hydrOXYzine (ATARAX/VISTARIL) 10 MG tablet TAKE  (1)  TABLET  THREE TIMES DAILY AS NEEDED.  [name] levocetirizine (XYZAL) 5 MG tablet TAKE 1 TABLET ONCE DAILY IN THE EVENING  . levothyroxine (SYNTHROID) 100 MCG tablet Take 1 tablet (100 mcg total) by mouth daily.  [name] LIVALO 2 MG TABS TAKE 1 TABLET EVERY DAY  . meclizine (ANTIVERT) 25 MG tablet TAKE (1) TABLET THREE TIMES DAILY AS NEEDED FOR DIZZINESS.  . Multiple Vitamins-Minerals (CENTRUM SILVER ADULT 50+ PO) Take 1 tablet by mouth every other day.  . Olopatadine HCl 0.2 % SOLN Apply 2 drops to eye daily.  . Omega-3 Fatty Acids (FISH OIL OMEGA-3 PO) Take by mouth.  . ondansetron (ZOFRAN) 4 MG tablet Take 1 tablet (4 mg total) by mouth every 8 (eight) hours as needed for nausea or vomiting.  . pantoprazole (PROTONIX) 20 MG tablet TAKE 1 TABLET DAILY. MUST MAKE APPOINTMENT FOR MORE REFILLS  . potassium chloride SA (KLOR-CON) 20 MEQ tablet TAKE (1) TABLET TWICE A DAY.  [name] predniSONE (STERAPRED UNI-PAK 21 TAB) 10 MG (21) TBPK tablet Use as directed  . triamcinolone ointment (KENALOG) 0.5 % Apply 1 application topically 2 (two) times daily.  [name] trimethoprim-polymyxin b (POLYTRIM) ophthalmic solution Place  1 drop into the left eye every 6 (six) hours.   Current Facility-Administered Medications for the 09/28/19 encounter (Office Visit) with Lorretta Harp, MD  Medication  . denosumab (PROLIA) injection 60 mg     Allergies  Allergen Reactions  . Ibuprofen     History of bleeding ulcers - contra-indicated   . Benadryl [Diphenhydramine Hcl] Other (See Comments)    Worsening depression   . Lipitor [Atorvastatin] Other (See Comments)    Body aches.  . Hydrocodone     Nausea and vomiting  . Codeine Nausea And Vomiting  . Morphine And Related Nausea And  Vomiting  . Tdap [Tetanus-Diphth-Acell Pertussis] Swelling    Redness and localized swelling at injection site     Social History   Socioeconomic History  . Marital status: Widowed    Spouse name: Not on file  . Number of children: 2  . Years of education: 0  . Highest education level: Never attended school  Occupational History  . Occupation: retired  Tobacco Use  . Smoking status: Former Smoker    Types: Cigarettes    Quit date: 02/24/1968    Years since quitting: 51.6  . Smokeless tobacco: Current User    Types: Snuff  . Tobacco comment: quit yrs and yrs ago when she was very young  Media planner  . Vaping Use: Never used  Substance and Sexual Activity  . Alcohol use: No  . Drug use: No  . Sexual activity: Not Currently    Birth control/protection: Post-menopausal  Other Topics Concern  . Not on file  Social History Narrative  . Not on file   Social Determinants of Health   Financial Resource Strain: Medium Risk  . Difficulty of Paying Living Expenses: Somewhat hard  Food Insecurity: No Food Insecurity  . Worried About [name] in the Last Year: Never true  . Ran Out of Food in the Last Year: Never true  Transportation Needs: No Transportation Needs  . Lack of Transportation (Medical): No  . Lack of Transportation (Non-Medical): No  Physical Activity: Insufficiently Active  . Days of Exercise per Week: 5 days  . Minutes of Exercise per Session: 20 min  Stress: No Stress Concern Present  . Feeling of Stress : Not at all  Social Connections: Socially Isolated  . Frequency of Communication with Friends and Family: More than three times a week  . Frequency of Social Gatherings with Friends and Family: More than three times a week  . Attends Religious Services: Never  . Active Member of Clubs or Organizations: No  . Attends Archivist Meetings: Never  . Marital Status: Widowed  Intimate Partner Violence: Not At Risk  . Fear of Current or  Ex-Partner: No  . Emotionally Abused: No  . Physically Abused: No  . Sexually Abused: No     Review of Systems: General: negative for chills, fever, night sweats or weight changes.  Cardiovascular: negative for chest pain, dyspnea on exertion, edema, orthopnea, palpitations, paroxysmal nocturnal dyspnea or shortness of breath Dermatological: negative for rash Respiratory: negative for cough or wheezing Urologic: negative for hematuria Abdominal: negative for nausea, vomiting, diarrhea, bright red blood per rectum, melena, or hematemesis Neurologic: negative for visual changes, syncope, or dizziness All other systems reviewed and are otherwise negative except as noted above.    Blood pressure 138/90, pulse (!) 109, height 5' (1.524 m), weight 227 lb 6.4 oz (103.1 kg), SpO2 95 %.  General appearance: alert and no  distress Neck: no adenopathy, no carotid bruit, no JVD, supple, symmetrical, trachea midline and thyroid not enlarged, symmetric, no tenderness/mass/nodules Lungs: clear to auscultation bilaterally Heart: regular rate and rhythm, S1, S2 normal, no murmur, click, rub or gallop Extremities: extremities normal, atraumatic, no cyanosis or edema Pulses: 2+ and symmetric Skin: Skin color, texture, turgor normal. No rashes or lesions Neurologic: Alert and oriented X 3, normal strength and tone. Normal symmetric reflexes. Normal coordination and gait  EKG sinus tachycardia 110 with poor R wave progression and nonspecific ST and T wave changes.  I personally reviewed this EKG.  ASSESSMENT AND PLAN:   Hyperlipidemia History of hyperlipidemia  on statin therapy (Livalo) with lipid profile performed 07/26/2019 revealing a total cholesterol 163, LDL 86 and HDL 43.  Essential hypertension, benign History of essential hypertension a blood pressure measured today 138/90.  She is currently not on any antihypertensive medications.  I am going to begin her on carvedilol 3.125 mg p.o. twice  daily.  Shortness of breath Ms. Wilshire was referred to me for shortness of breath and atypical chest pain.  She did have a 2D echo performed 09/12/2019 revealing an EF of 20 to 25% with global hypokinesia and grade 3 diastolic dysfunction.  She had mild to moderate MR and TR.  She also had a coronary calcium score performed which was 318 primarily in the LAD territory.  Based on this, I am going to perform right and left heart cath on her this coming Monday (radial/brachial. The patient understands that risks included but are not limited to stroke (1 in 1000), death (1 in 1000), kidney failure [usually temporary] (1 in 500), bleeding (1 in 200), allergic reaction [possibly serious] (1 in 200). The patient understands and agrees to proceed      Lorretta Harp MD [facility], [facility] 09/28/2019 3:50 PM

## 2019-09-28 NOTE — Assessment & Plan Note (Signed)
History of essential hypertension a blood pressure measured today 138/90.  She is currently not on any antihypertensive medications.  I am going to begin her on carvedilol 3.125 mg p.o. twice daily.

## 2019-09-28 NOTE — H&P (View-Only) (Signed)
09/28/2019 Sheila [name]   Oct 14, 1940  [id number]  Primary Physician Sharion Balloon, FNP Primary Cardiologist: Lorretta Harp MD [facility], [place], [place]  HPI:  Sheila [name] is a 79 y.o.  moderately overweight widowed Caucasian female mother of 2 children, grandmother of 3 grandchildren who is accompanied by her daughter Sheila [name] today.  She is referred by [name] FNP for evaluation of chest pain and shortness of breath.  I last saw her in the office 08/29/2019. She does have a history of having worked in a [employer] when she was younger.  She is never smoked but does not have a diagnosis of COPD.  She has hyperlipidemia on Livalo.  She is never had a heart attack or stroke.  She complains of episodic shortness of breath, dyspnea on exertion and atypical chest pain.  I performed 2D echocardiography on her on 09/20/2019 revealing EF of 20 to 25% with grade 3 diastolic dysfunction, moderate MR and TR as well as a coronary calcium score performed the same day which was 318 primarily in the LAD territory.  Based on this I recommended that we proceed with outpatient diagnostic coronary angiography to define her anatomy and physiology.   Current Meds  Medication Sig  . albuterol (PROVENTIL) (2.5 MG/3ML) 0.083% nebulizer solution NEBULIZE 1 VIAL EVERY 6-8 HOURS AS NEEDED FOR WHEEZING OR SHORTNESS OF BREATH  . ALPRAZolam (XANAX) 0.5 MG tablet TAKE (1) TABLET TWICE A DAY.  [name] BREO ELLIPTA 200-25 MCG/INH AEPB Inhale 1 puff into the lungs daily.  . budesonide-formoterol (SYMBICORT) 160-4.5 MCG/ACT inhaler Inhale 2 puffs into the lungs 2 (two) times daily.  . cetirizine (ZYRTEC) 10 MG tablet Take 1 tablet (10 mg total) by mouth daily.  . Cholecalciferol (VITAMIN D3) 25 MCG (1000 UT) CAPS Take by mouth.  . diclofenac sodium (VOLTAREN) 1 % GEL Apply 2 g topically 4 (four) times daily.  . DULoxetine (CYMBALTA) 60 MG capsule TAKE 1 CAPUSLE ONCE DAILY  . fluticasone (FLONASE) 50 MCG/ACT nasal spray  Place 2 sprays into both nostrils daily.  . furosemide (LASIX) 20 MG tablet Take 1 tablet (20 mg total) by mouth daily.  [name] gabapentin (NEURONTIN) 300 MG capsule TAKE (1) CAPSULE THREE TIMES DAILY.  . hydrOXYzine (ATARAX/VISTARIL) 10 MG tablet TAKE  (1)  TABLET  THREE TIMES DAILY AS NEEDED.  [name] levocetirizine (XYZAL) 5 MG tablet TAKE 1 TABLET ONCE DAILY IN THE EVENING  . levothyroxine (SYNTHROID) 100 MCG tablet Take 1 tablet (100 mcg total) by mouth daily.  [name] LIVALO 2 MG TABS TAKE 1 TABLET EVERY DAY  . meclizine (ANTIVERT) 25 MG tablet TAKE (1) TABLET THREE TIMES DAILY AS NEEDED FOR DIZZINESS.  . Multiple Vitamins-Minerals (CENTRUM SILVER ADULT 50+ PO) Take 1 tablet by mouth every other day.  . Olopatadine HCl 0.2 % SOLN Apply 2 drops to eye daily.  . Omega-3 Fatty Acids (FISH OIL OMEGA-3 PO) Take by mouth.  . ondansetron (ZOFRAN) 4 MG tablet Take 1 tablet (4 mg total) by mouth every 8 (eight) hours as needed for nausea or vomiting.  . pantoprazole (PROTONIX) 20 MG tablet TAKE 1 TABLET DAILY. MUST MAKE APPOINTMENT FOR MORE REFILLS  . potassium chloride SA (KLOR-CON) 20 MEQ tablet TAKE (1) TABLET TWICE A DAY.  [name] predniSONE (STERAPRED UNI-PAK 21 TAB) 10 MG (21) TBPK tablet Use as directed  . triamcinolone ointment (KENALOG) 0.5 % Apply 1 application topically 2 (two) times daily.  [name] trimethoprim-polymyxin b (POLYTRIM) ophthalmic solution Place  1 drop into the left eye every 6 (six) hours.   Current Facility-Administered Medications for the 09/28/19 encounter (Office Visit) with Lorretta Harp, MD  Medication  . denosumab (PROLIA) injection 60 mg     Allergies  Allergen Reactions  . Ibuprofen     History of bleeding ulcers - contra-indicated   . Benadryl [Diphenhydramine Hcl] Other (See Comments)    Worsening depression   . Lipitor [Atorvastatin] Other (See Comments)    Body aches.  . Hydrocodone     Nausea and vomiting  . Codeine Nausea And Vomiting  . Morphine And Related Nausea And  Vomiting  . Tdap [Tetanus-Diphth-Acell Pertussis] Swelling    Redness and localized swelling at injection site     Social History   Socioeconomic History  . Marital status: Widowed    Spouse name: Not on file  . Number of children: 2  . Years of education: 0  . Highest education level: Never attended school  Occupational History  . Occupation: retired  Tobacco Use  . Smoking status: Former Smoker    Types: Cigarettes    Quit date: 02/24/1968    Years since quitting: 51.6  . Smokeless tobacco: Current User    Types: Snuff  . Tobacco comment: quit yrs and yrs ago when she was very young  Media planner  . Vaping Use: Never used  Substance and Sexual Activity  . Alcohol use: No  . Drug use: No  . Sexual activity: Not Currently    Birth control/protection: Post-menopausal  Other Topics Concern  . Not on file  Social History Narrative  . Not on file   Social Determinants of Health   Financial Resource Strain: Medium Risk  . Difficulty of Paying Living Expenses: Somewhat hard  Food Insecurity: No Food Insecurity  . Worried About [name] in the Last Year: Never true  . Ran Out of Food in the Last Year: Never true  Transportation Needs: No Transportation Needs  . Lack of Transportation (Medical): No  . Lack of Transportation (Non-Medical): No  Physical Activity: Insufficiently Active  . Days of Exercise per Week: 5 days  . Minutes of Exercise per Session: 20 min  Stress: No Stress Concern Present  . Feeling of Stress : Not at all  Social Connections: Socially Isolated  . Frequency of Communication with Friends and Family: More than three times a week  . Frequency of Social Gatherings with Friends and Family: More than three times a week  . Attends Religious Services: Never  . Active Member of Clubs or Organizations: No  . Attends Archivist Meetings: Never  . Marital Status: Widowed  Intimate Partner Violence: Not At Risk  . Fear of Current or  Ex-Partner: No  . Emotionally Abused: No  . Physically Abused: No  . Sexually Abused: No     Review of Systems: General: negative for chills, fever, night sweats or weight changes.  Cardiovascular: negative for chest pain, dyspnea on exertion, edema, orthopnea, palpitations, paroxysmal nocturnal dyspnea or shortness of breath Dermatological: negative for rash Respiratory: negative for cough or wheezing Urologic: negative for hematuria Abdominal: negative for nausea, vomiting, diarrhea, bright red blood per rectum, melena, or hematemesis Neurologic: negative for visual changes, syncope, or dizziness All other systems reviewed and are otherwise negative except as noted above.    Blood pressure 138/90, pulse (!) 109, height 5' (1.524 m), weight 227 lb 6.4 oz (103.1 kg), SpO2 95 %.  General appearance: alert and no  distress Neck: no adenopathy, no carotid bruit, no JVD, supple, symmetrical, trachea midline and thyroid not enlarged, symmetric, no tenderness/mass/nodules Lungs: clear to auscultation bilaterally Heart: regular rate and rhythm, S1, S2 normal, no murmur, click, rub or gallop Extremities: extremities normal, atraumatic, no cyanosis or edema Pulses: 2+ and symmetric Skin: Skin color, texture, turgor normal. No rashes or lesions Neurologic: Alert and oriented X 3, normal strength and tone. Normal symmetric reflexes. Normal coordination and gait  EKG sinus tachycardia 110 with poor R wave progression and nonspecific ST and T wave changes.  I personally reviewed this EKG.  ASSESSMENT AND PLAN:   Hyperlipidemia History of hyperlipidemia  on statin therapy (Livalo) with lipid profile performed 07/26/2019 revealing a total cholesterol 163, LDL 86 and HDL 43.  Essential hypertension, benign History of essential hypertension a blood pressure measured today 138/90.  She is currently not on any antihypertensive medications.  I am going to begin her on carvedilol 3.125 mg p.o. twice  daily.  Shortness of breath Ms. Stauffer was referred to me for shortness of breath and atypical chest pain.  She did have a 2D echo performed 09/12/2019 revealing an EF of 20 to 25% with global hypokinesia and grade 3 diastolic dysfunction.  She had mild to moderate MR and TR.  She also had a coronary calcium score performed which was 318 primarily in the LAD territory.  Based on this, I am going to perform right and left heart cath on her this coming Monday (radial/brachial. The patient understands that risks included but are not limited to stroke (1 in 1000), death (1 in 1000), kidney failure [usually temporary] (1 in 500), bleeding (1 in 200), allergic reaction [possibly serious] (1 in 200). The patient understands and agrees to proceed      Lorretta Harp MD [facility], [facility] 09/28/2019 3:50 PM

## 2019-09-28 NOTE — Assessment & Plan Note (Addendum)
History of hyperlipidemia  on statin therapy (Livalo) with lipid profile performed 07/26/2019 revealing a total cholesterol 163, LDL 86 and HDL 43.

## 2019-09-28 NOTE — Assessment & Plan Note (Signed)
Sheila [name] was referred to me for shortness of breath and atypical chest pain.  She did have a 2D echo performed 09/12/2019 revealing an EF of 20 to 25% with global hypokinesia and grade 3 diastolic dysfunction.  She had mild to moderate MR and TR.  She also had a coronary calcium score performed which was 318 primarily in the LAD territory.  Based on this, I am going to perform right and left heart cath on her this coming Monday (radial/brachial. The patient understands that risks included but are not limited to stroke (1 in 1000), death (1 in 1000), kidney failure [usually temporary] (1 in 500), bleeding (1 in 200), allergic reaction [possibly serious] (1 in 200). The patient understands and agrees to proceed

## 2019-09-28 NOTE — Patient Instructions (Addendum)
Medication Instructions:   START Carvedilol (Coreg) 3.125 mg 2 times a day  *If you need a refill on your cardiac medications before your next appointment, please call your pharmacy*  Lab Work: Your physician recommends that you return for lab work TODAY:  BMET  CBC  You will need to have a COVID test. This test is done at [place], [place], [place] [address] If you have labs (blood work) drawn today and your tests are completely normal, you will receive your results only by: [name] MyChart Message (if you have MyChart) OR . A paper copy in the mail If you have any lab test that is abnormal or we need to change your treatment, we will call you to review the results.   Testing/Procedures: Your physician has requested that you have a cardiac catheterization. Cardiac catheterization is used to diagnose and/or treat various heart conditions. Doctors may recommend this procedure for a number of different reasons. The most common reason is to evaluate chest pain. Chest pain can be a symptom of coronary artery disease (CAD), and cardiac catheterization can show whether plaque is narrowing or blocking your heart's arteries. This procedure is also used to evaluate the valves, as well as measure the blood flow and oxygen levels in different parts of your heart. For further information please visit [email or web address]. Please follow instruction sheet, as given.  Follow-Up: At [facility], you and your health needs are our priority.  As part of our continuing mission to provide you with exceptional heart care, we have created designated Provider Care Teams.  These Care Teams include your primary Cardiologist (physician) and Advanced Practice Providers (APPs -  Physician Assistants and Nurse Practitioners) who all work together to provide you with the care you need, when you need it.   Your next appointment:   2-3 week(s) POST CATH  The format for your next appointment:   In  Person  Provider:   Quay Burow, MD   Other Instructions       [place] [address] Dept: [phone] Loc: [place]  09/28/2019  You are scheduled for a Cardiac Catheterization on Monday, August 9 with Dr. Quay Burow.  1. Please arrive at the [facility] (Main Entrance A) at [facility]: [address] [place], [place] [address] at 7:30 AM (This time is two hours before your procedure to ensure your preparation). Free valet parking service is available.   Special note: Every effort is made to have your procedure done on time. Please understand that emergencies sometimes delay scheduled procedures.  2. Diet: Do not eat solid foods after midnight.  The patient may have clear liquids until 5am upon the day of the procedure.  3. Labs: You will need to have blood drawn on Thursday, August 5 at [facility]  Open: 8am - 5pm (Lunch 12:30 - 1:30)   Phone: [phone]. You do not need to be fasting.  4. Medication instructions in preparation for your procedure:   Contrast Allergy: No   Stop taking, Lasix (Furosemide)  Sunday, August 8,   On the morning of your procedure, take your Aspirin and any morning medicines NOT listed above.  You may use sips of water.  5. Plan for one night stay--bring personal belongings. 6. Bring a current list of your medications and current insurance cards. 7. You MUST have a responsible person  to drive you home. 8. Someone MUST be with you the first 24 hours after you arrive home or your discharge will be delayed. 9. Please wear clothes that are easy to get on and off and wear slip-on shoes.  Thank you for allowing [place] to care for you!   -- [facility]

## 2019-09-29 ENCOUNTER — Other Ambulatory Visit (HOSPITAL_COMMUNITY)
Admission: RE | Admit: 2019-09-29 | Discharge: 2019-09-29 | Disposition: A | Discharge status: Home / Self Care | Payer: Medicare Other | Source: Ambulatory Visit | Attending: Cardiovascular Disease | Admitting: Cardiovascular Disease

## 2019-09-29 DIAGNOSIS — Z20822 Contact with and (suspected) exposure to covid-19: Secondary | ICD-10-CM | POA: Insufficient documentation

## 2019-09-29 DIAGNOSIS — K219 Gastro-esophageal reflux disease without esophagitis: Secondary | ICD-10-CM | POA: Diagnosis not present

## 2019-09-29 DIAGNOSIS — Z01812 Encounter for preprocedural laboratory examination: Secondary | ICD-10-CM | POA: Insufficient documentation

## 2019-09-29 DIAGNOSIS — Z791 Long term (current) use of non-steroidal anti-inflammatories (NSAID): Secondary | ICD-10-CM | POA: Diagnosis not present

## 2019-09-29 DIAGNOSIS — I110 Hypertensive heart disease with heart failure: Secondary | ICD-10-CM | POA: Diagnosis not present

## 2019-09-29 DIAGNOSIS — Z887 Allergy status to serum and vaccine status: Secondary | ICD-10-CM | POA: Diagnosis not present

## 2019-09-29 DIAGNOSIS — Z888 Allergy status to other drugs, medicaments and biological substances status: Secondary | ICD-10-CM | POA: Diagnosis not present

## 2019-09-29 DIAGNOSIS — E876 Hypokalemia: Secondary | ICD-10-CM | POA: Diagnosis not present

## 2019-09-29 DIAGNOSIS — Z538 Procedure and treatment not carried out for other reasons: Secondary | ICD-10-CM | POA: Diagnosis not present

## 2019-09-29 DIAGNOSIS — I428 Other cardiomyopathies: Secondary | ICD-10-CM | POA: Diagnosis not present

## 2019-09-29 DIAGNOSIS — R000 Tachycardia, unspecified: Secondary | ICD-10-CM | POA: Diagnosis not present

## 2019-09-29 DIAGNOSIS — E785 Hyperlipidemia, unspecified: Secondary | ICD-10-CM | POA: Diagnosis not present

## 2019-09-29 DIAGNOSIS — I5021 Acute systolic (congestive) heart failure: Secondary | ICD-10-CM | POA: Diagnosis not present

## 2019-09-29 DIAGNOSIS — Z885 Allergy status to narcotic agent status: Secondary | ICD-10-CM | POA: Diagnosis not present

## 2019-09-29 DIAGNOSIS — Z596 Low income: Secondary | ICD-10-CM | POA: Diagnosis not present

## 2019-09-29 DIAGNOSIS — Z7951 Long term (current) use of inhaled steroids: Secondary | ICD-10-CM | POA: Diagnosis not present

## 2019-09-29 DIAGNOSIS — Z79899 Other long term (current) drug therapy: Secondary | ICD-10-CM | POA: Diagnosis not present

## 2019-09-29 DIAGNOSIS — G4733 Obstructive sleep apnea (adult) (pediatric): Secondary | ICD-10-CM | POA: Diagnosis not present

## 2019-09-29 DIAGNOSIS — I2510 Atherosclerotic heart disease of native coronary artery without angina pectoris: Secondary | ICD-10-CM | POA: Diagnosis not present

## 2019-09-29 DIAGNOSIS — R570 Cardiogenic shock: Secondary | ICD-10-CM | POA: Diagnosis not present

## 2019-09-29 LAB — BASIC METABOLIC PANEL
BUN/Creatinine Ratio: 13 (ref 12–28)
BUN: 13 mg/dL (ref 8–27)
CO2: 22 mmol/L (ref 20–29)
Calcium: 10.7 mg/dL — ABNORMAL HIGH (ref 8.7–10.3)
Chloride: 99 mmol/L (ref 96–106)
Creatinine, Ser: 1.02 mg/dL — ABNORMAL HIGH (ref 0.57–1.00)
GFR calc Af Amer: 61 mL/min/{1.73_m2} (ref >59)
GFR calc non Af Amer: 53 mL/min/{1.73_m2} — ABNORMAL LOW (ref >59)
Glucose: 100 mg/dL — ABNORMAL HIGH (ref 65–99)
Potassium: 4.4 mmol/L (ref 3.5–5.2)
Sodium: 141 mmol/L (ref 134–144)

## 2019-09-29 LAB — CBC
Hematocrit: 35.8 % (ref 34.0–46.6)
Hemoglobin: 11.4 g/dL (ref 11.1–15.9)
MCH: 26 pg — ABNORMAL LOW (ref 26.6–33.0)
MCHC: 31.8 g/dL (ref 31.5–35.7)
MCV: 82 fL (ref 79–97)
Platelets: 334 10*3/uL (ref 150–450)
RBC: 4.39 x10E6/uL (ref 3.77–5.28)
RDW: 14 % (ref 11.7–15.4)
WBC: 12.4 10*3/uL — ABNORMAL HIGH (ref 3.4–10.8)

## 2019-09-29 LAB — SARS CORONAVIRUS 2 (TAT 6-24 HRS): SARS Coronavirus 2: NEGATIVE

## 2019-10-02 ENCOUNTER — Inpatient Hospital Stay (HOSPITAL_COMMUNITY)
Admission: AD | Disposition: A | Discharge status: Home / Self Care | Payer: Self-pay | Source: Home / Self Care | Attending: Cardiovascular Disease

## 2019-10-02 ENCOUNTER — Inpatient Hospital Stay: Payer: Self-pay

## 2019-10-02 ENCOUNTER — Other Ambulatory Visit: Payer: Self-pay

## 2019-10-02 ENCOUNTER — Encounter (HOSPITAL_COMMUNITY): Payer: Self-pay | Admitting: Cardiovascular Disease

## 2019-10-02 ENCOUNTER — Inpatient Hospital Stay (HOSPITAL_COMMUNITY)
Admission: AD | Admit: 2019-10-02 | Discharge: 2019-10-05 | DRG: 286 | Disposition: A | Discharge status: Home Health | Payer: Medicare Other | Attending: Internal Medicine | Admitting: Internal Medicine

## 2019-10-02 DIAGNOSIS — R000 Tachycardia, unspecified: Secondary | ICD-10-CM | POA: Diagnosis present

## 2019-10-02 DIAGNOSIS — Z888 Allergy status to other drugs, medicaments and biological substances status: Secondary | ICD-10-CM

## 2019-10-02 DIAGNOSIS — E876 Hypokalemia: Secondary | ICD-10-CM | POA: Diagnosis present

## 2019-10-02 DIAGNOSIS — I110 Hypertensive heart disease with heart failure: Secondary | ICD-10-CM | POA: Diagnosis present

## 2019-10-02 DIAGNOSIS — Z791 Long term (current) use of non-steroidal anti-inflammatories (NSAID): Secondary | ICD-10-CM | POA: Diagnosis not present

## 2019-10-02 DIAGNOSIS — E669 Obesity, unspecified: Secondary | ICD-10-CM | POA: Diagnosis present

## 2019-10-02 DIAGNOSIS — Z538 Procedure and treatment not carried out for other reasons: Secondary | ICD-10-CM | POA: Diagnosis not present

## 2019-10-02 DIAGNOSIS — Z885 Allergy status to narcotic agent status: Secondary | ICD-10-CM | POA: Diagnosis not present

## 2019-10-02 DIAGNOSIS — F329 Major depressive disorder, single episode, unspecified: Secondary | ICD-10-CM | POA: Diagnosis present

## 2019-10-02 DIAGNOSIS — Z887 Allergy status to serum and vaccine status: Secondary | ICD-10-CM | POA: Diagnosis not present

## 2019-10-02 DIAGNOSIS — Z20822 Contact with and (suspected) exposure to covid-19: Secondary | ICD-10-CM | POA: Diagnosis not present

## 2019-10-02 DIAGNOSIS — Z6841 Body Mass Index (BMI) 40.0 and over, adult: Secondary | ICD-10-CM

## 2019-10-02 DIAGNOSIS — Z7951 Long term (current) use of inhaled steroids: Secondary | ICD-10-CM

## 2019-10-02 DIAGNOSIS — Z79890 Hormone replacement therapy (postmenopausal): Secondary | ICD-10-CM

## 2019-10-02 DIAGNOSIS — F40240 Claustrophobia: Secondary | ICD-10-CM | POA: Diagnosis present

## 2019-10-02 DIAGNOSIS — I5021 Acute systolic (congestive) heart failure: Secondary | ICD-10-CM | POA: Diagnosis not present

## 2019-10-02 DIAGNOSIS — I519 Heart disease, unspecified: Secondary | ICD-10-CM | POA: Diagnosis not present

## 2019-10-02 DIAGNOSIS — R570 Cardiogenic shock: Secondary | ICD-10-CM

## 2019-10-02 DIAGNOSIS — Z79899 Other long term (current) drug therapy: Secondary | ICD-10-CM | POA: Diagnosis not present

## 2019-10-02 DIAGNOSIS — I428 Other cardiomyopathies: Secondary | ICD-10-CM

## 2019-10-02 DIAGNOSIS — R110 Nausea: Secondary | ICD-10-CM | POA: Diagnosis not present

## 2019-10-02 DIAGNOSIS — G4733 Obstructive sleep apnea (adult) (pediatric): Secondary | ICD-10-CM | POA: Diagnosis present

## 2019-10-02 DIAGNOSIS — Z596 Low income: Secondary | ICD-10-CM

## 2019-10-02 DIAGNOSIS — R0789 Other chest pain: Secondary | ICD-10-CM | POA: Diagnosis present

## 2019-10-02 DIAGNOSIS — K219 Gastro-esophageal reflux disease without esophagitis: Secondary | ICD-10-CM | POA: Diagnosis present

## 2019-10-02 DIAGNOSIS — F17290 Nicotine dependence, other tobacco product, uncomplicated: Secondary | ICD-10-CM | POA: Diagnosis present

## 2019-10-02 DIAGNOSIS — E785 Hyperlipidemia, unspecified: Secondary | ICD-10-CM | POA: Diagnosis present

## 2019-10-02 DIAGNOSIS — I2510 Atherosclerotic heart disease of native coronary artery without angina pectoris: Secondary | ICD-10-CM | POA: Diagnosis present

## 2019-10-02 HISTORY — PX: RIGHT/LEFT HEART CATH AND CORONARY ANGIOGRAPHY: CATH118266

## 2019-10-02 HISTORY — DX: Heart disease, unspecified: I51.9

## 2019-10-02 LAB — BASIC METABOLIC PANEL
Anion gap: 13 (ref 5–15)
BUN: 17 mg/dL (ref 8–23)
CO2: 25 mmol/L (ref 22–32)
Calcium: 9.7 mg/dL (ref 8.9–10.3)
Chloride: 102 mmol/L (ref 98–111)
Creatinine, Ser: 1.07 mg/dL — ABNORMAL HIGH (ref 0.44–1.00)
GFR calc Af Amer: 58 mL/min — ABNORMAL LOW (ref >60)
GFR calc non Af Amer: 50 mL/min — ABNORMAL LOW (ref >60)
Glucose, Bld: 117 mg/dL — ABNORMAL HIGH (ref 70–99)
Potassium: 3.6 mmol/L (ref 3.5–5.1)
Sodium: 140 mmol/L (ref 135–145)

## 2019-10-02 LAB — POCT I-STAT EG7
Acid-Base Excess: 4 mmol/L — ABNORMAL HIGH (ref 0.0–2.0)
Acid-Base Excess: 4 mmol/L — ABNORMAL HIGH (ref 0.0–2.0)
Bicarbonate: 31 mmol/L — ABNORMAL HIGH (ref 20.0–28.0)
Bicarbonate: 31.4 mmol/L — ABNORMAL HIGH (ref 20.0–28.0)
Calcium, Ion: 1.36 mmol/L (ref 1.15–1.40)
Calcium, Ion: 1.38 mmol/L (ref 1.15–1.40)
HCT: 34 % — ABNORMAL LOW (ref 36.0–46.0)
HCT: 34 % — ABNORMAL LOW (ref 36.0–46.0)
Hemoglobin: 11.6 g/dL — ABNORMAL LOW (ref 12.0–15.0)
Hemoglobin: 11.6 g/dL — ABNORMAL LOW (ref 12.0–15.0)
O2 Saturation: 33 %
O2 Saturation: 34 %
Potassium: 4.8 mmol/L (ref 3.5–5.1)
Potassium: 4.9 mmol/L (ref 3.5–5.1)
Sodium: 141 mmol/L (ref 135–145)
Sodium: 141 mmol/L (ref 135–145)
TCO2: 33 mmol/L — ABNORMAL HIGH (ref 22–32)
TCO2: 33 mmol/L — ABNORMAL HIGH (ref 22–32)
pCO2, Ven: 58.7 mmHg (ref 44.0–60.0)
pCO2, Ven: 59 mmHg (ref 44.0–60.0)
pH, Ven: 7.329 (ref 7.250–7.430)
pH, Ven: 7.336 (ref 7.250–7.430)
pO2, Ven: 23 mmHg — CL (ref 32.0–45.0)
pO2, Ven: 23 mmHg — CL (ref 32.0–45.0)

## 2019-10-02 LAB — POCT I-STAT 7, (LYTES, BLD GAS, ICA,H+H)
Acid-Base Excess: 2 mmol/L (ref 0.0–2.0)
Bicarbonate: 27.6 mmol/L (ref 20.0–28.0)
Calcium, Ion: 1.32 mmol/L (ref 1.15–1.40)
HCT: 34 % — ABNORMAL LOW (ref 36.0–46.0)
Hemoglobin: 11.6 g/dL — ABNORMAL LOW (ref 12.0–15.0)
O2 Saturation: 100 %
Potassium: 4.8 mmol/L (ref 3.5–5.1)
Sodium: 141 mmol/L (ref 135–145)
TCO2: 29 mmol/L (ref 22–32)
pCO2 arterial: 47.2 mmHg (ref 32.0–48.0)
pH, Arterial: 7.375 (ref 7.350–7.450)
pO2, Arterial: 177 mmHg — ABNORMAL HIGH (ref 83.0–108.0)

## 2019-10-02 SURGERY — RIGHT/LEFT HEART CATH AND CORONARY ANGIOGRAPHY
Anesthesia: LOCAL

## 2019-10-02 MED ORDER — DIGOXIN 125 MCG PO TABS
0.1250 mg | ORAL_TABLET | Freq: Every day | ORAL | Status: DC
  Administered 2019-10-02 – 2019-10-05 (×4): 0.125 mg via ORAL
  Filled 2019-10-02 (×4): qty 1

## 2019-10-02 MED ORDER — ASPIRIN 81 MG PO CHEW: 81.0000 mg | CHEWABLE_TABLET | ORAL | Status: DC

## 2019-10-02 MED ORDER — SODIUM CHLORIDE 0.9 % IV SOLN: INTRAVENOUS | Status: DC

## 2019-10-02 MED ORDER — FENTANYL CITRATE (PF) 100 MCG/2ML IJ SOLN
INTRAMUSCULAR | Status: DC | PRN
  Administered 2019-10-02: 25 ug via INTRAVENOUS

## 2019-10-02 MED ORDER — PRAVASTATIN SODIUM 40 MG PO TABS
80.0000 mg | ORAL_TABLET | Freq: Every day | ORAL | Status: DC
  Administered 2019-10-02 – 2019-10-05 (×4): 80 mg via ORAL
  Filled 2019-10-02 (×4): qty 2

## 2019-10-02 MED ORDER — LABETALOL HCL 5 MG/ML IV SOLN: 10.0000 mg | INTRAVENOUS | Status: AC | PRN

## 2019-10-02 MED ORDER — HEPARIN SODIUM (PORCINE) 1000 UNIT/ML IJ SOLN
INTRAMUSCULAR | Status: AC
  Filled 2019-10-02: qty 1

## 2019-10-02 MED ORDER — SODIUM CHLORIDE 0.9 % IV SOLN: 250.0000 mL | INTRAVENOUS | Status: DC | PRN

## 2019-10-02 MED ORDER — MIDAZOLAM HCL 2 MG/2ML IJ SOLN
INTRAMUSCULAR | Status: AC
  Filled 2019-10-02: qty 2

## 2019-10-02 MED ORDER — FLUTICASONE FUROATE-VILANTEROL 200-25 MCG/INH IN AEPB
1.0000 | INHALATION_SPRAY | Freq: Every day | RESPIRATORY_TRACT | Status: DC
  Filled 2019-10-02 (×2): qty 28

## 2019-10-02 MED ORDER — HYDRALAZINE HCL 20 MG/ML IJ SOLN: 10.0000 mg | INTRAMUSCULAR | Status: AC | PRN

## 2019-10-02 MED ORDER — DULOXETINE HCL 60 MG PO CPEP
60.0000 mg | ORAL_CAPSULE | Freq: Every day | ORAL | Status: DC
  Administered 2019-10-03 – 2019-10-05 (×3): 60 mg via ORAL
  Filled 2019-10-02 (×3): qty 1

## 2019-10-02 MED ORDER — IOHEXOL 350 MG/ML SOLN
INTRAVENOUS | Status: DC | PRN
  Administered 2019-10-02: 40 mL

## 2019-10-02 MED ORDER — CHLORHEXIDINE GLUCONATE CLOTH 2 % EX PADS
6.0000 | MEDICATED_PAD | Freq: Every day | CUTANEOUS | Status: DC
  Administered 2019-10-03 – 2019-10-04 (×3): 6 via TOPICAL

## 2019-10-02 MED ORDER — FUROSEMIDE 10 MG/ML IJ SOLN
40.0000 mg | Freq: Once | INTRAMUSCULAR | Status: AC
  Administered 2019-10-02: 40 mg via INTRAVENOUS

## 2019-10-02 MED ORDER — ASPIRIN 81 MG PO CHEW
81.0000 mg | CHEWABLE_TABLET | Freq: Every day | ORAL | Status: DC
  Administered 2019-10-03 – 2019-10-05 (×3): 81 mg via ORAL
  Filled 2019-10-02 (×3): qty 1

## 2019-10-02 MED ORDER — SODIUM CHLORIDE 0.9% FLUSH: 3.0000 mL | Freq: Two times a day (BID) | INTRAVENOUS | Status: DC

## 2019-10-02 MED ORDER — CARVEDILOL 3.125 MG PO TABS: 3.1250 mg | ORAL_TABLET | Freq: Two times a day (BID) | ORAL | Status: DC

## 2019-10-02 MED ORDER — PANTOPRAZOLE SODIUM 40 MG PO TBEC
40.0000 mg | DELAYED_RELEASE_TABLET | Freq: Every day | ORAL | Status: DC
  Administered 2019-10-03 – 2019-10-05 (×3): 40 mg via ORAL
  Filled 2019-10-02 (×3): qty 1

## 2019-10-02 MED ORDER — FLUTICASONE FUROATE-VILANTEROL 200-25 MCG/INH IN AEPB
1.0000 | INHALATION_SPRAY | Freq: Every day | RESPIRATORY_TRACT | Status: DC
  Filled 2019-10-02: qty 28

## 2019-10-02 MED ORDER — LIDOCAINE HCL (PF) 1 % IJ SOLN
INTRAMUSCULAR | Status: DC | PRN
  Administered 2019-10-02: 2 mL via SUBCUTANEOUS

## 2019-10-02 MED ORDER — FUROSEMIDE 10 MG/ML IJ SOLN
80.0000 mg | Freq: Two times a day (BID) | INTRAMUSCULAR | Status: DC
  Administered 2019-10-02 – 2019-10-04 (×4): 80 mg via INTRAVENOUS
  Filled 2019-10-02 (×5): qty 8

## 2019-10-02 MED ORDER — SPIRONOLACTONE 12.5 MG HALF TABLET
12.5000 mg | ORAL_TABLET | Freq: Every day | ORAL | Status: DC
  Administered 2019-10-02 – 2019-10-03 (×2): 12.5 mg via ORAL
  Filled 2019-10-02 (×2): qty 1

## 2019-10-02 MED ORDER — SODIUM CHLORIDE 0.9 % IV SOLN: INTRAVENOUS | Status: AC

## 2019-10-02 MED ORDER — ACETAMINOPHEN 325 MG PO TABS
650.0000 mg | ORAL_TABLET | ORAL | Status: DC | PRN
  Administered 2019-10-03 (×2): 650 mg via ORAL
  Filled 2019-10-02 (×2): qty 2

## 2019-10-02 MED ORDER — SODIUM CHLORIDE 0.9% FLUSH: 3.0000 mL | INTRAVENOUS | Status: DC | PRN

## 2019-10-02 MED ORDER — SODIUM CHLORIDE 0.9% FLUSH
10.0000 mL | Freq: Two times a day (BID) | INTRAVENOUS | Status: DC
  Administered 2019-10-03: 20 mL
  Administered 2019-10-03 – 2019-10-05 (×3): 10 mL

## 2019-10-02 MED ORDER — VERAPAMIL HCL 2.5 MG/ML IV SOLN
INTRA_ARTERIAL | Status: DC | PRN
  Administered 2019-10-02: 5 mL via INTRA_ARTERIAL

## 2019-10-02 MED ORDER — MIDAZOLAM HCL 2 MG/2ML IJ SOLN
INTRAMUSCULAR | Status: DC | PRN
  Administered 2019-10-02: 0.5 mg via INTRAVENOUS

## 2019-10-02 MED ORDER — HEPARIN (PORCINE) IN NACL 1000-0.9 UT/500ML-% IV SOLN
INTRAVENOUS | Status: AC
  Filled 2019-10-02: qty 1000

## 2019-10-02 MED ORDER — SODIUM CHLORIDE 0.9% FLUSH: 10.0000 mL | INTRAVENOUS | Status: DC | PRN

## 2019-10-02 MED ORDER — ALPRAZOLAM 0.25 MG PO TABS
0.2500 mg | ORAL_TABLET | Freq: Two times a day (BID) | ORAL | Status: DC | PRN
  Administered 2019-10-03 – 2019-10-04 (×3): 0.25 mg via ORAL
  Filled 2019-10-02 (×3): qty 1

## 2019-10-02 MED ORDER — FUROSEMIDE 20 MG PO TABS: 20.0000 mg | ORAL_TABLET | Freq: Every day | ORAL | Status: DC

## 2019-10-02 MED ORDER — FENTANYL CITRATE (PF) 100 MCG/2ML IJ SOLN
INTRAMUSCULAR | Status: AC
  Filled 2019-10-02: qty 2

## 2019-10-02 MED ORDER — MILRINONE LACTATE IN DEXTROSE 20-5 MG/100ML-% IV SOLN
0.1250 ug/kg/min | INTRAVENOUS | Status: DC
  Administered 2019-10-02 – 2019-10-03 (×2): 0.125 ug/kg/min via INTRAVENOUS
  Filled 2019-10-02 (×2): qty 100

## 2019-10-02 MED ORDER — FUROSEMIDE 10 MG/ML IJ SOLN
INTRAMUSCULAR | Status: AC
  Filled 2019-10-02: qty 4

## 2019-10-02 MED ORDER — LIDOCAINE HCL (PF) 1 % IJ SOLN
INTRAMUSCULAR | Status: AC
  Filled 2019-10-02: qty 30

## 2019-10-02 MED ORDER — ONDANSETRON HCL 4 MG/2ML IJ SOLN
4.0000 mg | Freq: Four times a day (QID) | INTRAMUSCULAR | Status: DC | PRN
  Administered 2019-10-03 – 2019-10-04 (×2): 4 mg via INTRAVENOUS
  Filled 2019-10-02 (×2): qty 2

## 2019-10-02 MED ORDER — LEVOTHYROXINE SODIUM 100 MCG PO TABS
100.0000 ug | ORAL_TABLET | Freq: Every day | ORAL | Status: DC
  Administered 2019-10-03 – 2019-10-05 (×3): 100 ug via ORAL
  Filled 2019-10-02 (×3): qty 1

## 2019-10-02 MED ORDER — HEPARIN (PORCINE) IN NACL 1000-0.9 UT/500ML-% IV SOLN
INTRAVENOUS | Status: DC | PRN
  Administered 2019-10-02: 1000 mL

## 2019-10-02 MED ORDER — GABAPENTIN 100 MG PO CAPS
100.0000 mg | ORAL_CAPSULE | Freq: Three times a day (TID) | ORAL | Status: DC
  Administered 2019-10-02 – 2019-10-05 (×10): 100 mg via ORAL
  Filled 2019-10-02 (×10): qty 1

## 2019-10-02 MED ORDER — VERAPAMIL HCL 2.5 MG/ML IV SOLN
INTRAVENOUS | Status: AC
  Filled 2019-10-02: qty 2

## 2019-10-02 SURGICAL SUPPLY — 14 items
CATH BALLN WEDGE 5F 110CM (CATHETERS) ×2 IMPLANT
CATH OPTITORQUE TIG 4.0 5F (CATHETERS) ×2 IMPLANT
DEVICE RAD COMP TR BAND LRG (VASCULAR PRODUCTS) ×2 IMPLANT
GLIDESHEATH SLEND A-KIT 6F 22G (SHEATH) ×2 IMPLANT
GUIDEWIRE .025 260CM (WIRE) ×2 IMPLANT
GUIDEWIRE INQWIRE 1.5J.035X260 (WIRE) ×2 IMPLANT
INQWIRE 1.5J .035X260CM (WIRE) ×4
KIT HEART LEFT (KITS) ×2 IMPLANT
PACK CARDIAC CATHETERIZATION (CUSTOM PROCEDURE TRAY) ×2 IMPLANT
SHEATH GLIDE SLENDER 4/5FR (SHEATH) ×2 IMPLANT
TRANSDUCER W/STOPCOCK (MISCELLANEOUS) ×2 IMPLANT
TUBING CIL FLEX 10 FLL-RA (TUBING) ×2 IMPLANT
WIRE EMERALD 3MM-J .025X260CM (WIRE) ×2 IMPLANT
WIRE HI TORQ VERSACORE-J 145CM (WIRE) ×2 IMPLANT

## 2019-10-02 NOTE — Progress Notes (Signed)
Rt radial and rt brachial sites are unremarkable. Pt leaves cath lab holding area in stable condition.

## 2019-10-02 NOTE — Progress Notes (Signed)
Pt unable to empty bladder with purewick. Attempted to place in and out catheter without success. Assisted out of bed with 3 people to bathroom. Successfully voided in bathroom.

## 2019-10-02 NOTE — Consult Note (Addendum)
Advanced Heart Failure Team Consult Note   Primary Physician: Sharion Balloon, FNP PCP-Cardiologist/Referring:  Dr. Gwenlyn Found   Reason for Consultation: Cardiogenic shock  HPI:    Sheila [name] is seen today for evaluation of cardiogenic shock at the request of Dr. Gwenlyn Found.   Sheila [name] is a 78 y.o. woman with h/o obesity, HL, GERD, chronic snuff use  and HTN who recently was seen as an outpatient by Dr. Gwenlyn Found for 2 month h/o progressive SOB. Echo on 09/20/2019 revealed EF of 20 to 25% with grade 3 diastolic dysfunction, RV moderately down moderate MR and TR. Coronary calcium score performed the same day which was 318 primarily in the LAD territory.  Underwent R/L cath today. Severe NICM with EF 20-25%  Clean coronaries except for 90%D1  Ao 117/81 (96) LV  114/26 RA 20 RV 45/21 PA 49/24 (32) PCW 31 Fick 2.5/1.3 Pa sat 33% PAPI 1.25  Lives at home alone. 2 month h/o progressive exercise intolerance and dyspnea. Minimal edema. Rare chest pressure. Now with NYHA IIIb symptoms. Denies snoring or daytime fatigue. Occasional palpitations.   Review of Systems: [y] = yes, [ ]  = no   . General: Weight gain [ ] ; Weight loss [ ] ; Anorexia [ ] ; Fatigue [ ] ; Fever [ ] ; Chills [ ] ; Weakness [ ]   . Cardiac: Chest pain/pressure [ ] ; Resting SOB [ y]; Exertional SOB Blue.Reese ]; Orthopnea Blue.Reese ]; Pedal Edema [ ] ; Palpitations Blue.Reese ]; Syncope [ ] ; Presyncope [ ] ; Paroxysmal nocturnal dyspnea[ ]   . Pulmonary: Cough Blue.Reese ]; Wheezing[ ] ; Hemoptysis[ ] ; Sputum [ ] ; Snoring [ ]   . GI: Vomiting[ ] ; Dysphagia[ ] ; Melena[ ] ; Hematochezia [ ] ; Heartburn[ ] ; Abdominal pain [ ] ; Constipation [ ] ; Diarrhea [ ] ; BRBPR [ ]   . GU: Hematuria[ ] ; Dysuria [ ] ; Nocturia[ ]   . Vascular: Pain in legs with walking [ ] ; Pain in feet with lying flat [ ] ; Non-healing sores [ ] ; Stroke [ ] ; TIA [ ] ; Slurred speech [ ] ;  . Neuro: Headaches[y ]; Vertigo[ ] ; Seizures[ ] ; Paresthesias[ ] ;Blurred vision [ ] ; Diplopia [ ] ; Vision changes [ ]     . Ortho/Skin: Arthritis [ y]; Joint pain [ y]; Muscle pain Blue.Reese ]; Joint swelling [ ] ; Back Pain Blue.Reese ]; Rash [ ]   . Psych: Depression[ ] ; Anxiety[ ]   . Heme: Bleeding problems [ ] ; Clotting disorders [ ] ; Anemia [ ]   . Endocrine: Diabetes [ ] ; Thyroid dysfunction[ ]   Home Medications Prior to Admission medications   Medication Sig Start Date End Date Taking? Authorizing Provider  ALPRAZolam Duanne Moron) 0.5 MG tablet TAKE (1) TABLET TWICE A DAY. 07/26/19  Yes Hawks, Christy A, FNP  BREO ELLIPTA 200-25 MCG/INH AEPB Inhale 1 puff into the lungs daily. 08/21/19  Yes [provider]  budesonide-formoterol (SYMBICORT) 160-4.5 MCG/ACT inhaler Inhale 2 puffs into the lungs 2 (two) times daily. 07/31/19  Yes Martin, Mary-Margaret, FNP  carvedilol (COREG) 3.125 MG tablet Take 1 tablet (3.125 mg total) by mouth 2 (two) times daily. 09/28/19 12/27/19 Yes Lorretta Harp, MD  cetirizine (ZYRTEC) 10 MG tablet Take 1 tablet (10 mg total) by mouth daily. 07/26/19  Yes Hawks, Christy A, FNP  Cholecalciferol (VITAMIN D3) 25 MCG (1000 UT) CAPS Take by mouth.   Yes [provider]  diclofenac sodium (VOLTAREN) 1 % GEL Apply 2 g topically 4 (four) times daily. 09/26/18  Yes Hawks, Christy A, FNP  DULoxetine (CYMBALTA) 60 MG capsule TAKE 1  CAPUSLE ONCE DAILY 09/21/19  Yes Hawks, Christy A, FNP  fluticasone (FLONASE) 50 MCG/ACT nasal spray Place 2 sprays into both nostrils daily. 07/26/19  Yes Hawks, Christy A, FNP  furosemide (LASIX) 20 MG tablet Take 1 tablet (20 mg total) by mouth daily. 08/08/19  Yes Hawks, Christy A, FNP  gabapentin (NEURONTIN) 300 MG capsule TAKE (1) CAPSULE THREE TIMES DAILY. 09/20/19  Yes Hawks, Alyse Low A, FNP  levocetirizine (XYZAL) 5 MG tablet TAKE 1 TABLET ONCE DAILY IN THE EVENING 07/18/19  Yes Hawks, Christy A, FNP  levothyroxine (SYNTHROID) 100 MCG tablet Take 1 tablet (100 mcg total) by mouth daily. 07/28/19 07/27/20 Yes Hawks, Theador Hawthorne, FNP  LIVALO 2 MG TABS TAKE 1 TABLET EVERY DAY 08/01/19   Yes Hawks, [place] A, FNP  Multiple Vitamins-Minerals (CENTRUM SILVER ADULT 50+ PO) Take 1 tablet by mouth every other day.   Yes [provider]  Olopatadine HCl 0.2 % SOLN Apply 2 drops to eye daily. 01/10/19  Yes Hawks, Christy A, FNP  Omega-3 Fatty Acids (FISH OIL OMEGA-3 PO) Take by mouth.   Yes [provider]  pantoprazole (PROTONIX) 20 MG tablet TAKE 1 TABLET DAILY. MUST MAKE APPOINTMENT FOR MORE REFILLS 08/23/19  Yes Laurine Blazer A, PA-C  potassium chloride SA (KLOR-CON) 20 MEQ tablet TAKE (1) TABLET TWICE A DAY. 08/01/19  Yes Hawks, Christy A, FNP  triamcinolone ointment (KENALOG) 0.5 % Apply 1 application topically 2 (two) times daily. 02/06/19  Yes Hendricks Limes F, FNP  trimethoprim-polymyxin b (POLYTRIM) ophthalmic solution Place 1 drop into the left eye every 6 (six) hours. 07/14/19  Yes Hawks, Christy A, FNP  albuterol (PROVENTIL) (2.5 MG/3ML) 0.083% nebulizer solution NEBULIZE 1 VIAL EVERY 6-8 HOURS AS NEEDED FOR WHEEZING OR SHORTNESS OF BREATH 06/07/19   [name] A, FNP  hydrOXYzine (ATARAX/VISTARIL) 10 MG tablet TAKE  (1)  TABLET  THREE TIMES DAILY AS NEEDED. 05/01/19   [name] A, FNP  meclizine (ANTIVERT) 25 MG tablet TAKE (1) TABLET THREE TIMES DAILY AS NEEDED FOR DIZZINESS. 09/29/19   [name] A, FNP  ondansetron (ZOFRAN) 4 MG tablet Take 1 tablet (4 mg total) by mouth every 8 (eight) hours as needed for nausea or vomiting. 07/31/19   Hassell Done Mary-Margaret, FNP  predniSONE (STERAPRED UNI-PAK 21 TAB) 10 MG (21) TBPK tablet Use as directed 08/03/19   Sharion Balloon, FNP    Past Medical History: Past Medical History:  Diagnosis Date  . Allergy   . Bell's palsy   . Cancer ([place])    skin CA on face  . Cataract   . Chest pain 07/2009   Myoveiw stress test negative.  . Depression   . Diverticulosis of colon   . DJD (degenerative joint disease) of knee   . Dyspnea    on exertion  . Family history of adverse reaction to anesthesia    sister ha  nausea/vomiting  . Gastric ulcer with hemorrhage 01/14/2011  . GERD (gastroesophageal reflux disease)   . Headache   . History of fractured pelvis   . Hypercholesteremia   . Hypertension   . Hypothyroidism   . UTI (lower urinary tract infection)     Past Surgical History: Past Surgical History:  Procedure Laterality Date  . APPENDECTOMY    . BREAST BIOPSY Left   . ESOPHAGOGASTRODUODENOSCOPY  01/14/2011   Procedure: ESOPHAGOGASTRODUODENOSCOPY (EGD);  Surgeon: Rogene Houston, MD;  Location: [facility];  Service: Endoscopy;  Laterality: N/A;  . HIP SURGERY  pelvis  . JOINT REPLACEMENT    . KNEE SURGERY  04/2010.   Total left knee replacement  . LAPAROSCOPIC APPENDECTOMY N/A 08/01/2013   Procedure: APPENDECTOMY LAPAROSCOPIC;  Surgeon: Jamesetta So, MD;  Location: [facility];  Service: General;  Laterality: N/A;  . RIGHT/LEFT HEART CATH AND CORONARY ANGIOGRAPHY N/A 10/02/2019   Procedure: RIGHT/LEFT HEART CATH AND CORONARY ANGIOGRAPHY;  Surgeon: Lorretta Harp, MD;  Location: [facility];  Service: Cardiovascular;  Laterality: N/A;    Family History: Family History  Problem Relation Age of Onset  . Aneurysm Mother   . Stroke Mother   . Hypertension Mother   . Cancer Father        lung  . Heart disease Sister   . Hip fracture Sister   . Cancer Brother   . Alzheimer's disease Sister   . Cancer Sister        skin, uterine  . Cancer Brother     Social History: Social History   Socioeconomic History  . Marital status: Widowed    Spouse name: Not on file  . Number of children: 2  . Years of education: 0  . Highest education level: Never attended school  Occupational History  . Occupation: retired  Tobacco Use  . Smoking status: Former Smoker    Types: Cigarettes    Quit date: 02/24/1968    Years since quitting: 51.6  . Smokeless tobacco: Current User    Types: Snuff  . Tobacco comment: quit yrs and yrs ago when she was very young  Media planner  . Vaping  Use: Never used  Substance and Sexual Activity  . Alcohol use: No  . Drug use: No  . Sexual activity: Not Currently    Birth control/protection: Post-menopausal  Other Topics Concern  . Not on file  Social History Narrative  . Not on file   Social Determinants of Health   Financial Resource Strain: Medium Risk  . Difficulty of Paying Living Expenses: Somewhat hard  Food Insecurity: No Food Insecurity  . Worried About [name] in the Last Year: Never true  . Ran Out of Food in the Last Year: Never true  Transportation Needs: No Transportation Needs  . Lack of Transportation (Medical): No  . Lack of Transportation (Non-Medical): No  Physical Activity: Insufficiently Active  . Days of Exercise per Week: 5 days  . Minutes of Exercise per Session: 20 min  Stress: No Stress Concern Present  . Feeling of Stress : Not at all  Social Connections: Socially Isolated  . Frequency of Communication with Friends and Family: More than three times a week  . Frequency of Social Gatherings with Friends and Family: More than three times a week  . Attends Religious Services: Never  . Active Member of Clubs or Organizations: No  . Attends Archivist Meetings: Never  . Marital Status: Widowed    Allergies:  Allergies  Allergen Reactions  . Ibuprofen     History of bleeding ulcers - contra-indicated   . Benadryl [Diphenhydramine Hcl] Other (See Comments)    Worsening depression   . Lipitor [Atorvastatin] Other (See Comments)    Body aches.  . Hydrocodone     Nausea and vomiting  . Codeine Nausea And Vomiting  . Morphine And Related Nausea And Vomiting  . Tdap [Tetanus-Diphth-Acell Pertussis] Swelling    Redness and localized swelling at injection site     Objective:    Vital Signs:   Temp:  [  97.6 F (36.4 C)-97.7 F (36.5 C)] 97.7 F (36.5 C) (08/09 1420) Pulse Rate:  [63-106] 76 (08/09 1346) Resp:  [10-28] 22 (08/09 1420) BP: (92-158)/(31-108) 118/69  (08/09 1420) SpO2:  [90 %-100 %] 98 % (08/09 1420) Weight:  [103.1 kg] 103.1 kg (08/09 0745)    Weight change: Filed Weights   10/02/19 0745  Weight: 103.1 kg    Intake/Output:  No intake or output data in the 24 hours ending 10/02/19 1439    Physical Exam    General:  Elderly obese woman lying in bed No resp difficulty HEENT: normal Neck: supple. JVP to ear . Carotids 2+ bilat; no bruits. No lymphadenopathy or thyromegaly appreciated. Cor: PMI nondisplaced. Distant HS Regular rate & rhythm. No rubs, gallops or murmurs. Lungs: clear Abdomen: obese soft, nontender, nondistended. No hepatosplenomegaly. No bruits or masses. Good bowel sounds. Extremities: no cyanosis, clubbing, rash, edema Neuro: alert & orientedx3, cranial nerves grossly intact. moves all 4 extremities w/o difficulty. Affect pleasant   Telemetry   NSR 70-80s Personally reviewed  EKG    Sinus tach 110. PRWP QRS 86 ms Personally reviewed   Labs   Basic Metabolic Panel: Recent Labs  Lab 09/28/19 1618  NA 141  K 4.4  CL 99  CO2 22  GLUCOSE 100*  BUN 13  CREATININE 1.02*  CALCIUM 10.7*    Liver Function Tests: No results for input(s): AST, ALT, ALKPHOS, BILITOT, PROT, ALBUMIN in the last 168 hours. No results for input(s): LIPASE, AMYLASE in the last 168 hours. No results for input(s): AMMONIA in the last 168 hours.  CBC: Recent Labs  Lab 09/28/19 1618  WBC 12.4*  HGB 11.4  HCT 35.8  MCV 82  PLT 334    Cardiac Enzymes: No results for input(s): CKTOTAL, CKMB, CKMBINDEX, TROPONINI in the last 168 hours.  BNP: BNP (last 3 results) No results for input(s): BNP in the last 8760 hours.  ProBNP (last 3 results) No results for input(s): PROBNP in the last 8760 hours.   CBG: No results for input(s): GLUCAP in the last 168 hours.  Coagulation Studies: No results for input(s): LABPROT, INR in the last 72 hours.   Imaging   CARDIAC CATHETERIZATION  Result Date: 10/02/2019  1st  Diag lesion is 90% stenosed.  Sheila [name] is a 79 y.o. female  [id number] LOCATION:  FACILITY: [facility] PHYSICIAN: Quay Burow, M.D. 1940/07/06 DATE OF PROCEDURE:  10/02/2019 DATE OF DISCHARGE: CARDIAC CATHETERIZATION History obtained from chart review.Sheila [name] is a 79 y.o.  moderately overweight widowed Caucasian female mother of 2 children, grandmother of 3 grandchildren who was referred by [name] FNP for evaluation of chest pain and shortness of breath.  I last saw her in the office 08/29/2019.She does have a history of having worked in a [employer] when she was younger. She is never smoked but does not have a diagnosis of COPD. She has hyperlipidemia on Livalo. She is never had a heart attack or stroke. She complains of episodic shortness of breath, dyspnea on exertion and atypical chest pain.  I performed 2D echocardiography on her on 09/20/2019 revealing EF of 20 to 25% with grade 3 diastolic dysfunction, moderate MR and TR as well as a coronary calcium score performed the same day which was 318 primarily in the LAD territory.  Based on this I recommended that we proceed with outpatient diagnostic coronary angiography to define her anatomy and physiology.   Sheila [name] has severe nonischemic cardiomyopathy with incidentally noted  ostial/proximal first diagonal branch lesion and a small to medium sized vessel. I think this is true/true and unrelated and probably does not require intervention. Given her severe desaturation and markedly depressed cardiac index we will keep her in the hospital on the heart failure service for guideline directed optimal medical therapy/optimization. She is already on Coreg and would benefit from additional diuretics and initiation of Entresto. The sheaths were removed and a TR band was placed on the right wrist to achieve patent hemostasis. The patient left lab in stable condition. I have communicated her clinical status to Dr. Haroldine Laws who service she will be on.  Quay Burow. MD, [facility] 10/02/2019 10:21 AM      Medications:     Current Medications: . aspirin  81 mg Oral Daily  . carvedilol  3.125 mg Oral BID  . [START ON 10/03/2019] DULoxetine  60 mg Oral Daily  . fluticasone furoate-vilanterol  1 puff Inhalation Daily  . [START ON 10/03/2019] furosemide  20 mg Oral Daily  . gabapentin  100 mg Oral TID  . [START ON 10/03/2019] levothyroxine  100 mcg Oral Daily  . [START ON 10/03/2019] pantoprazole  40 mg Oral Daily  . sodium chloride flush  3 mL Intravenous Q12H  . sodium chloride flush  3 mL Intravenous Q12H     Infusions: . sodium chloride          Assessment/Plan   1. 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 Etiology unclear. HTN and OSA potential candidates but HTN not that bad and denies symptoms of OSA - EF 20-25% with moderate RV dysfunction  - cath 8/9 essentially normal coronaries  - initial PA sat 33% PAPI 1.25. Volume elevated but looks much better on exam then on paper. - stop b-blocker - start milrinone 0.125 to start - add digoxin and spiro 12.5  - Will add losartan vs Entresto soon - increase IV lasix to 80 IV bid - will get cMRI   2. CAD - minimal branch vessel disease - ASA 81, statin and SGLT2i soon    3. HL - has not tolerated atorva in past - on pitvastatin  - LDL 86 - increase pitva to 4  4. Snuff use - cessation encouraged   Length of Stay: 0  Glori Bickers, MD  10/02/2019, 2:39 PM  Advanced Heart Failure Team Pager [phone] (M-F; 7a - 4p)  Please contact [facility] for night-coverage after hours (4p -7a ) and weekends on [email or web address]

## 2019-10-02 NOTE — Progress Notes (Signed)
Peripherally Inserted Central Catheter Placement  The IV Nurse has discussed with the patient and/or persons authorized to consent for the patient, the purpose of this procedure and the potential benefits and risks involved with this procedure.  The benefits include less needle sticks, lab draws from the catheter, and the patient may be discharged home with the catheter. Risks include, but not limited to, infection, bleeding, blood clot (thrombus formation), and puncture of an artery; nerve damage and irregular heartbeat and possibility to perform a PICC exchange if needed/ordered by physician.  Alternatives to this procedure were also discussed.  Bard Power PICC patient education guide, fact sheet on infection prevention and patient information card has been provided to patient /or left at bedside.    PICC Placement Documentation  PICC Double Lumen 06/00/45 PICC Right Basilic 35 cm 0 cm (Active)  Indication for Insertion or Continuance of Line Vasoactive infusions 10/02/19 1815  Exposed Catheter (cm) 0 cm 10/02/19 1815  Site Assessment Clean;Dry;Intact 10/02/19 1815  Lumen #1 Status Flushed;Blood return noted 10/02/19 1815  Lumen #2 Status Flushed;Blood return noted 10/02/19 1815  Dressing Type Transparent 10/02/19 1815  Dressing Status Clean;Dry;Intact;Antimicrobial disc in place;Other (Comment) 10/02/19 1815  Dressing Intervention New dressing 10/02/19 1815  Dressing Change Due 10/09/19 10/02/19 1815       Synthia Innocent 10/02/2019, 6:16 PM

## 2019-10-02 NOTE — Interval H&P Note (Signed)
Cath Lab Visit (complete for each Cath Lab visit)  Clinical Evaluation Leading to the Procedure:   ACS: No.  Non-ACS:    Anginal Classification: CCS II  Anti-ischemic medical therapy: Minimal Therapy (1 class of medications)  Non-Invasive Test Results: No non-invasive testing performed  Prior CABG: No previous CABG      History and Physical Interval Note:  10/02/2019 9:16 AM  Sheila [name]  has presented today for surgery, with the diagnosis of cardiomyopathy.  The various methods of treatment have been discussed with the patient and family. After consideration of risks, benefits and other options for treatment, the patient has consented to  Procedure(s): RIGHT/LEFT HEART CATH AND CORONARY ANGIOGRAPHY (N/A) as a surgical intervention.  The patient's history has been reviewed, patient examined, no change in status, stable for surgery.  I have reviewed the patient's chart and labs.  Questions were answered to the patient's satisfaction.     Quay Burow

## 2019-10-03 ENCOUNTER — Inpatient Hospital Stay (HOSPITAL_COMMUNITY): Payer: Medicare Other

## 2019-10-03 LAB — BASIC METABOLIC PANEL
Anion gap: 13 (ref 5–15)
BUN: 15 mg/dL (ref 8–23)
CO2: 32 mmol/L (ref 22–32)
Calcium: 9.6 mg/dL (ref 8.9–10.3)
Chloride: 96 mmol/L — ABNORMAL LOW (ref 98–111)
Creatinine, Ser: 1.18 mg/dL — ABNORMAL HIGH (ref 0.44–1.00)
GFR calc Af Amer: 51 mL/min — ABNORMAL LOW (ref >60)
GFR calc non Af Amer: 44 mL/min — ABNORMAL LOW (ref >60)
Glucose, Bld: 103 mg/dL — ABNORMAL HIGH (ref 70–99)
Potassium: 3.1 mmol/L — ABNORMAL LOW (ref 3.5–5.1)
Sodium: 141 mmol/L (ref 135–145)

## 2019-10-03 LAB — MAGNESIUM: Magnesium: 1.5 mg/dL — ABNORMAL LOW (ref 1.7–2.4)

## 2019-10-03 LAB — COOXEMETRY PANEL
Carboxyhemoglobin: 1.1 % (ref 0.5–1.5)
Carboxyhemoglobin: 1.1 % (ref 0.5–1.5)
Methemoglobin: 0.6 % (ref 0.0–1.5)
Methemoglobin: 0.6 % (ref 0.0–1.5)
O2 Saturation: 94.6 %
O2 Saturation: 69.3 %
Total hemoglobin: 11 g/dL — ABNORMAL LOW (ref 12.0–16.0)
Total hemoglobin: 10.8 g/dL — ABNORMAL LOW (ref 12.0–16.0)

## 2019-10-03 LAB — CBC
HCT: 34.6 % — ABNORMAL LOW (ref 36.0–46.0)
Hemoglobin: 10.5 g/dL — ABNORMAL LOW (ref 12.0–15.0)
MCH: 25 pg — ABNORMAL LOW (ref 26.0–34.0)
MCHC: 30.3 g/dL (ref 30.0–36.0)
MCV: 82.4 fL (ref 80.0–100.0)
Platelets: 291 10*3/uL (ref 150–400)
RBC: 4.2 MIL/uL (ref 3.87–5.11)
RDW: 14.4 % (ref 11.5–15.5)
WBC: 10.2 10*3/uL (ref 4.0–10.5)
nRBC: 0 % (ref 0.0–0.2)

## 2019-10-03 MED ORDER — SACUBITRIL-VALSARTAN 24-26 MG PO TABS
1.0000 | ORAL_TABLET | Freq: Two times a day (BID) | ORAL | Status: DC
  Administered 2019-10-03 – 2019-10-04 (×4): 1 via ORAL
  Filled 2019-10-03 (×5): qty 1

## 2019-10-03 MED ORDER — MAGNESIUM SULFATE 2 GM/50ML IV SOLN
2.0000 g | Freq: Once | INTRAVENOUS | Status: AC
  Administered 2019-10-03: 2 g via INTRAVENOUS
  Filled 2019-10-03: qty 50

## 2019-10-03 MED ORDER — SPIRONOLACTONE 25 MG PO TABS
25.0000 mg | ORAL_TABLET | Freq: Every day | ORAL | Status: DC
  Administered 2019-10-04 – 2019-10-05 (×2): 25 mg via ORAL
  Filled 2019-10-03 (×2): qty 1

## 2019-10-03 MED ORDER — POTASSIUM CHLORIDE CRYS ER 10 MEQ PO TBCR
40.0000 meq | EXTENDED_RELEASE_TABLET | Freq: Every day | ORAL | Status: DC
  Administered 2019-10-03 – 2019-10-04 (×2): 40 meq via ORAL
  Filled 2019-10-03 (×3): qty 4

## 2019-10-03 MED ORDER — POTASSIUM CHLORIDE CRYS ER 20 MEQ PO TBCR
40.0000 meq | EXTENDED_RELEASE_TABLET | ORAL | Status: AC
  Administered 2019-10-03 (×2): 40 meq via ORAL
  Filled 2019-10-03 (×2): qty 2

## 2019-10-03 MED ORDER — NICOTINE 21 MG/24HR TD PT24
21.0000 mg | MEDICATED_PATCH | Freq: Every day | TRANSDERMAL | Status: DC
  Administered 2019-10-03 – 2019-10-04 (×2): 21 mg via TRANSDERMAL
  Filled 2019-10-03 (×3): qty 1

## 2019-10-03 MED ORDER — NICOTINE POLACRILEX 2 MG MT GUM
2.0000 mg | CHEWING_GUM | OROMUCOSAL | Status: DC | PRN
  Filled 2019-10-03: qty 1

## 2019-10-03 MED ORDER — TRAMADOL HCL 50 MG PO TABS
25.0000 mg | ORAL_TABLET | Freq: Four times a day (QID) | ORAL | Status: DC | PRN
  Administered 2019-10-04: 25 mg via ORAL
  Filled 2019-10-03: qty 1

## 2019-10-03 MED FILL — Heparin Sodium (Porcine) Inj 1000 Unit/ML: INTRAMUSCULAR | Qty: 10 | Status: AC

## 2019-10-03 NOTE — Progress Notes (Signed)
Patient got in scanner and then wanted to stop the exam stating she could not stand it. She is claustrophobic. I offered to try to see if she could have anti-anxiety meds. She declined stating she did not want to do it and could not stand being in the MRI. RN notified and patient send back to unit.

## 2019-10-03 NOTE — Progress Notes (Signed)
Advanced Heart Failure Rounding Note   Subjective:    On milrinone 0.125. Co-ox 33% -> 69%  Diuresing well. Dyspnea improved. No CP,orthopnea or PND.   Unable to tolerate cMRI today due to claustrophobia. She remains tremulous today  Creatinine stable  K 3.1 JVP 12    Objective:   Weight Range:  Vital Signs:   Temp:  [97.7 F (36.5 C)-98 F (36.7 C)] 98 F (36.7 C) (08/10 0903) Pulse Rate:  [70-87] 85 (08/10 1024) Resp:  [16-28] 21 (08/10 0903) BP: (100-158)/(54-108) 138/78 (08/10 0903) SpO2:  [90 %-100 %] 97 % (08/10 0903) Weight:  [100.5 kg] 100.5 kg (08/10 0325)    Weight change: Filed Weights   10/02/19 0745 10/03/19 0325  Weight: 103.1 kg 100.5 kg    Intake/Output:   Intake/Output Summary (Last 24 hours) at 10/03/2019 1311 Last data filed at 10/03/2019 1025 Gross per 24 hour  Intake 897.74 ml  Output 3600 ml  Net -2702.26 ml     Physical Exam: General:  Sitting up in bed. Tremulous  No resp difficulty HEENT: normal Neck: supple. JVP 12 . Carotids 2+ bilat; no bruits. No lymphadenopathy or thryomegaly appreciated. Cor: PMI nondisplaced. Regular rate & rhythm. No rubs, gallops or murmurs. Lungs: clear Abdomen: obese soft, nontender, nondistended. No hepatosplenomegaly. No bruits or masses. Good bowel sounds. Extremities: no cyanosis, clubbing, rash, edema Neuro: alert & orientedx3, cranial nerves grossly intact. moves all 4 extremities w/o difficulty. Affect pleasant but anxious   Telemetry: Sinus 80s Personally reviewed   Labs: Basic Metabolic Panel: Recent Labs  Lab 09/28/19 1618 10/02/19 0951 10/02/19 0956 10/02/19 1625 10/03/19 0333  NA 141 141  141 141 140 141  K 4.4 4.8  4.9 4.8 3.6 3.1*  CL 99  --   --  102 96*  CO2 22  --   --  25 32  GLUCOSE 100*  --   --  117* 103*  BUN 13  --   --  17 15  CREATININE 1.02*  --   --  1.07* 1.18*  CALCIUM 10.7*  --   --  9.7 9.6  MG  --   --   --   --  1.5*    Liver Function Tests: No  results for input(s): AST, ALT, ALKPHOS, BILITOT, PROT, ALBUMIN in the last 168 hours. No results for input(s): LIPASE, AMYLASE in the last 168 hours. No results for input(s): AMMONIA in the last 168 hours.  CBC: Recent Labs  Lab 09/28/19 1618 10/02/19 0951 10/02/19 0956 10/03/19 0333  WBC 12.4*  --   --  10.2  HGB 11.4 11.6*  11.6* 11.6* 10.5*  HCT 35.8 34.0*  34.0* 34.0* 34.6*  MCV 82  --   --  82.4  PLT 334  --   --  291    Cardiac Enzymes: No results for input(s): CKTOTAL, CKMB, CKMBINDEX, TROPONINI in the last 168 hours.  BNP: BNP (last 3 results) No results for input(s): BNP in the last 8760 hours.  ProBNP (last 3 results) No results for input(s): PROBNP in the last 8760 hours.    Other results:  Imaging: CARDIAC CATHETERIZATION  Result Date: 10/02/2019  1st Diag lesion is 90% stenosed.  KATERINE MORUA is a 79 y.o. female  [id number] LOCATION:  FACILITY: [facility] PHYSICIAN: Quay Burow, M.D. [birth year] DATE OF PROCEDURE:  10/02/2019 DATE OF DISCHARGE: CARDIAC CATHETERIZATION History obtained from chart review.SHALVA ROZYCKI is a 79 y.o.  moderately overweight widowed Caucasian female  mother of 2 children, grandmother of 3 grandchildren who was referred by [name] FNP for evaluation of chest pain and shortness of breath.  I last saw her in the office 08/29/2019.She does have a history of having worked in a [employer] when she was younger. She is never smoked but does not have a diagnosis of COPD. She has hyperlipidemia on Livalo. She is never had a heart attack or stroke. She complains of episodic shortness of breath, dyspnea on exertion and atypical chest pain.  I performed 2D echocardiography on her on 09/20/2019 revealing EF of 20 to 25% with grade 3 diastolic dysfunction, moderate MR and TR as well as a coronary calcium score performed the same day which was 318 primarily in the LAD territory.  Based on this I recommended that we proceed with outpatient diagnostic  coronary angiography to define her anatomy and physiology.   Ms. Tyree has severe nonischemic cardiomyopathy with incidentally noted ostial/proximal first diagonal branch lesion and a small to medium sized vessel. I think this is true/true and unrelated and probably does not require intervention. Given her severe desaturation and markedly depressed cardiac index we will keep her in the hospital on the heart failure service for guideline directed optimal medical therapy/optimization. She is already on Coreg and would benefit from additional diuretics and initiation of Entresto. The sheaths were removed and a TR band was placed on the right wrist to achieve patent hemostasis. The patient left lab in stable condition. I have communicated her clinical status to Dr. Haroldine Laws who service she will be on. Quay Burow. MD, [facility] 10/02/2019 10:21 AM   [place] EKG SITE RITE  Result Date: 10/02/2019 If Site Rite image not attached, placement could not be confirmed due to current cardiac rhythm.     Medications:     Scheduled Medications: . aspirin  81 mg Oral Daily  . Chlorhexidine Gluconate Cloth  6 each Topical Daily  . digoxin  0.125 mg Oral Daily  . DULoxetine  60 mg Oral Daily  . fluticasone furoate-vilanterol  1 puff Inhalation Daily  . furosemide  80 mg Intravenous BID  . gabapentin  100 mg Oral TID  . levothyroxine  100 mcg Oral Daily  . pantoprazole  40 mg Oral Daily  . potassium chloride  40 mEq Oral Daily  . potassium chloride  40 mEq Oral Q4H  . pravastatin  80 mg Oral q1800  . sodium chloride flush  10-40 mL Intracatheter Q12H  . sodium chloride flush  3 mL Intravenous Q12H  . sodium chloride flush  3 mL Intravenous Q12H  . spironolactone  12.5 mg Oral Daily     Infusions: . sodium chloride    . milrinone 0.125 mcg/kg/min (10/03/19 1243)     PRN Medications:  sodium chloride, acetaminophen, ALPRAZolam, ondansetron (ZOFRAN) IV, sodium chloride flush, sodium chloride  flush   Assessment/Plan:   1. 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 Etiology unclear. HTN and OSA potential candidates but HTN not that bad and denies symptoms of OSA - EF 20-25% with moderate RV dysfunction  - cath 8/9 essentially normal coronaries  - initial PA sat 33% PAPI 1.25. Co-ox up to 69% on low-dose milrinone  - b-blocker stopped due to low output - continue digoxin and spiro - start Entresto 24/26 - CVP 12 Continue IV lasix one more day - continue milrinone one more day - unable to tolerate cMRI  - CR  consult   2. CAD - minimal branch vessel disease - no s/s ischemia - ASA 81, statin and SGLT2i soon    3. HL - has not tolerated atorva in past - on pitvastatin  - LDL 86 - increased pitva to 4 yesterday  4. Snuff use - cessation encouraged  - suspect nicotine withdrawal. Add patch   5. Hypokalemia.  - increase spiro. supp K.    Length of Stay: 1   Glori Bickers MD 10/03/2019, 1:11 PM  Advanced Heart Failure Team Pager [phone] (M-F; 7a - 4p)  Please contact [facility] for night-coverage after hours (4p -7a ) and weekends on [email or web address]

## 2019-10-03 NOTE — Care Management (Signed)
1059 10-03-19 Case Manager received consult for Sheila [name], and Jardiance. Benefits check submitted and Case Manager will follow for cost. Graves-Bigelow, Ocie Cornfield, RN,BSN Case Manager

## 2019-10-03 NOTE — TOC Benefit Eligibility Note (Signed)
Transition of Care [facility]) Benefit Eligibility Note    Patient Details  Name: Sheila [name] MRN: [id number] Date of Birth: 01-27-1941   Medication/Dose: ENTRESTO 24-26 MG BID  ,  FARXIGA 5 MG DAILY  10 MG DAILY  , JARDIANCE 10 MG DAILY  Covered?: Yes  Tier: 3 Drug  Prescription Coverage Preferred Pharmacy: [facility]   and   [place] with Person/Company/Phone Number:: TINA   @ [employer] [place]  #  [phone]  Co-Pay: 25 % OF TOTAL COST  FOR EACH PRESCRIPTION  Prior Approval: No  Deductible: Met (PATIENT IN COVERAGE GAP)  Additional Notes: MEDICAID  [place]  ACCESS : EFF-DATE 02-23-2014  CO-PAY- $4.00    Memory Argue Phone Number: 10/03/2019, 1:24 PM

## 2019-10-04 LAB — COOXEMETRY PANEL
Carboxyhemoglobin: 1.6 % — ABNORMAL HIGH (ref 0.5–1.5)
Methemoglobin: 0.9 % (ref 0.0–1.5)
O2 Saturation: 61.4 %
Total hemoglobin: 12.9 g/dL (ref 12.0–16.0)

## 2019-10-04 LAB — BASIC METABOLIC PANEL
Anion gap: 13 (ref 5–15)
BUN: 14 mg/dL (ref 8–23)
CO2: 31 mmol/L (ref 22–32)
Calcium: 9.8 mg/dL (ref 8.9–10.3)
Chloride: 98 mmol/L (ref 98–111)
Creatinine, Ser: 1 mg/dL (ref 0.44–1.00)
GFR calc Af Amer: >60 mL/min (ref >60)
GFR calc non Af Amer: 54 mL/min — ABNORMAL LOW (ref >60)
Glucose, Bld: 134 mg/dL — ABNORMAL HIGH (ref 70–99)
Potassium: 3.2 mmol/L — ABNORMAL LOW (ref 3.5–5.1)
Sodium: 142 mmol/L (ref 135–145)

## 2019-10-04 LAB — MAGNESIUM: Magnesium: 1.8 mg/dL (ref 1.7–2.4)

## 2019-10-04 MED ORDER — FLUTICASONE FUROATE-VILANTEROL 200-25 MCG/INH IN AEPB
1.0000 | INHALATION_SPRAY | Freq: Every day | RESPIRATORY_TRACT | Status: DC
  Administered 2019-10-05: 1 via RESPIRATORY_TRACT
  Filled 2019-10-04: qty 28

## 2019-10-04 MED ORDER — POTASSIUM CHLORIDE CRYS ER 20 MEQ PO TBCR: 40.0000 meq | EXTENDED_RELEASE_TABLET | Freq: Once | ORAL | Status: DC

## 2019-10-04 MED ORDER — TORSEMIDE 20 MG PO TABS: 40.0000 mg | ORAL_TABLET | Freq: Two times a day (BID) | ORAL | Status: DC

## 2019-10-04 MED ORDER — POTASSIUM CHLORIDE CRYS ER 20 MEQ PO TBCR
40.0000 meq | EXTENDED_RELEASE_TABLET | ORAL | Status: AC
  Administered 2019-10-04 (×2): 40 meq via ORAL
  Filled 2019-10-04 (×2): qty 2

## 2019-10-04 MED ORDER — TETRAHYDROZOLINE HCL 0.05 % OP SOLN
1.0000 [drp] | Freq: Two times a day (BID) | OPHTHALMIC | Status: DC
  Administered 2019-10-04 – 2019-10-05 (×2): 1 [drp] via OPHTHALMIC
  Filled 2019-10-04: qty 15

## 2019-10-04 MED ORDER — FUROSEMIDE 10 MG/ML IJ SOLN
80.0000 mg | Freq: Two times a day (BID) | INTRAMUSCULAR | Status: AC
  Administered 2019-10-04: 80 mg via INTRAVENOUS
  Filled 2019-10-04: qty 8

## 2019-10-04 MED ORDER — MAGNESIUM SULFATE 2 GM/50ML IV SOLN
2.0000 g | Freq: Once | INTRAVENOUS | Status: AC
  Administered 2019-10-04: 2 g via INTRAVENOUS
  Filled 2019-10-04: qty 50

## 2019-10-04 NOTE — Progress Notes (Addendum)
Advanced Heart Failure Rounding Note   Subjective:    On milrinone 0.125. Co-ox 33% -> 69%-->61% today   Diuresing well. -2.8L in UOP yesterday. Wt down total of 7 lb. SCr ok 1.0. K 3.2.  CVP 7-8 today (just got a dose of IV Lasix this morning).   Unable to tolerate cMRI yesterday due to claustrophobia.   She is feeling better. Breathing improved. Only complaint is nausea. Did not eat much breakfast.    Objective:   Weight Range:  Vital Signs:   Temp:  [98 F (36.7 C)-98.8 F (37.1 C)] 98.5 F (36.9 C) (08/11 0500) Pulse Rate:  [85-99] 99 (08/11 0500) Resp:  [18-24] 18 (08/11 0500) BP: (110-144)/(66-73) 131/66 (08/11 0500) SpO2:  [89 %-94 %] 89 % (08/11 0500) Weight:  [99.8 kg] 99.8 kg (08/11 0500) Last BM Date: 10/02/19  Weight change: Filed Weights   10/02/19 0745 10/03/19 0325 10/04/19 0500  Weight: 103.1 kg 100.5 kg 99.8 kg    Intake/Output:   Intake/Output Summary (Last 24 hours) at 10/04/2019 0930 Last data filed at 10/03/2019 2216 Gross per 24 hour  Intake 1600 ml  Output 2490 ml  Net -890 ml     Physical Exam: CVP 7-8 General:  Sitting up on side of bed. No respiratory difficult  HEENT: normal Neck: supple. JVP ~8 cm . Carotids 2+ bilat; no bruits. No lymphadenopathy or thryomegaly appreciated. Cor: PMI nondisplaced. Regular rhythm, mildly tachy rate. No rubs, gallops or murmurs. Lungs: clear bilaterally  Abdomen: obese soft, nontender, nondistended. No hepatosplenomegaly. No bruits or masses. Good bowel sounds. Extremities: no cyanosis, clubbing, rash. Obese extremities, no pitting edema Neuro: alert & orientedx3, cranial nerves grossly intact. moves all 4 extremities w/o difficulty. Affect pleasant but anxious   Telemetry: sinus tach low 100s, ? MAT vs PACs  Personally reviewed   Labs: Basic Metabolic Panel: Recent Labs  Lab 09/28/19 1618 09/28/19 1618 10/02/19 0951 10/02/19 0956 10/02/19 1625 10/03/19 0333 10/04/19 0500  NA 141  --   141  141 141 140 141 142  K 4.4   < > 4.8  4.9 4.8 3.6 3.1* 3.2*  CL 99  --   --   --  102 96* 98  CO2 22  --   --   --  25 32 31  GLUCOSE 100*  --   --   --  117* 103* 134*  BUN 13  --   --   --  17 15 14   CREATININE 1.02*  --   --   --  1.07* 1.18* 1.00  CALCIUM 10.7*   < >  --   --  9.7 9.6 9.8  MG  --   --   --   --   --  1.5* 1.8   < > = values in this interval not displayed.    Liver Function Tests: No results for input(s): AST, ALT, ALKPHOS, BILITOT, PROT, ALBUMIN in the last 168 hours. No results for input(s): LIPASE, AMYLASE in the last 168 hours. No results for input(s): AMMONIA in the last 168 hours.  CBC: Recent Labs  Lab 09/28/19 1618 10/02/19 0951 10/02/19 0956 10/03/19 0333  WBC 12.4*  --   --  10.2  HGB 11.4 11.6*  11.6* 11.6* 10.5*  HCT 35.8 34.0*  34.0* 34.0* 34.6*  MCV 82  --   --  82.4  PLT 334  --   --  291    Cardiac Enzymes: No results for input(s): CKTOTAL,  CKMB, CKMBINDEX, TROPONINI in the last 168 hours.  BNP: BNP (last 3 results) No results for input(s): BNP in the last 8760 hours.  ProBNP (last 3 results) No results for input(s): PROBNP in the last 8760 hours.    Other results:  Imaging: CARDIAC CATHETERIZATION  Result Date: 10/02/2019  1st Diag lesion is 90% stenosed.  Sheila [name] is a 79 y.o. female  [id number] LOCATION:  FACILITY: [facility] PHYSICIAN: Sheila [name], M.D. 1940-10-11 DATE OF PROCEDURE:  10/02/2019 DATE OF DISCHARGE: CARDIAC CATHETERIZATION History obtained from chart review.Sheila [name] is a 80 y.o.  moderately overweight widowed Caucasian female mother of 2 children, grandmother of 3 grandchildren who was referred by Sheila [name] for evaluation of chest pain and shortness of breath.  I last saw her in the office 08/29/2019.She does have a history of having worked in a [employer] when she was younger. She is never smoked but does not have a diagnosis of COPD. She has hyperlipidemia on Livalo. She is never had a heart  attack or stroke. She complains of episodic shortness of breath, dyspnea on exertion and atypical chest pain.  I performed 2D echocardiography on her on 09/20/2019 revealing EF of 20 to 25% with grade 3 diastolic dysfunction, moderate MR and TR as well as a coronary calcium score performed the same day which was 318 primarily in the LAD territory.  Based on this I recommended that we proceed with outpatient diagnostic coronary angiography to define her anatomy and physiology.   Sheila [name] has severe nonischemic cardiomyopathy with incidentally noted ostial/proximal first diagonal branch lesion and a small to medium sized vessel. I think this is true/true and unrelated and probably does not require intervention. Given her severe desaturation and markedly depressed cardiac index we will keep her in the hospital on the heart failure service for guideline directed optimal medical therapy/optimization. She is already on Coreg and would benefit from additional diuretics and initiation of Entresto. The sheaths were removed and a TR band was placed on the right wrist to achieve patent hemostasis. The patient left lab in stable condition. I have communicated her clinical status to Sheila [name] who service she will be on. Sheila [name]. MD, [facility] 10/02/2019 10:21 AM   [place] EKG SITE RITE  Result Date: 10/02/2019 If Site Rite image not attached, placement could not be confirmed due to current cardiac rhythm.    Medications:     Scheduled Medications: . aspirin  81 mg Oral Daily  . Chlorhexidine Gluconate Cloth  6 each Topical Daily  . digoxin  0.125 mg Oral Daily  . DULoxetine  60 mg Oral Daily  . fluticasone furoate-vilanterol  1 puff Inhalation Daily  . furosemide  80 mg Intravenous BID  . gabapentin  100 mg Oral TID  . levothyroxine  100 mcg Oral Daily  . nicotine  21 mg Transdermal Daily  . pantoprazole  40 mg Oral Daily  . potassium chloride  40 mEq Oral Daily  . pravastatin  80 mg Oral q1800  .  sacubitril-valsartan  1 tablet Oral BID  . sodium chloride flush  10-40 mL Intracatheter Q12H  . sodium chloride flush  3 mL Intravenous Q12H  . sodium chloride flush  3 mL Intravenous Q12H  . spironolactone  25 mg Oral Daily    Infusions: . sodium chloride    . milrinone 0.125 mcg/kg/min (10/03/19 1623)    PRN Medications: sodium chloride, acetaminophen, ALPRAZolam, nicotine polacrilex, ondansetron (ZOFRAN) IV, sodium chloride flush, sodium chloride  flush, traMADol   Assessment/Plan:   1. 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 Etiology unclear. HTN and OSA potential candidates but HTN not that bad and denies symptoms of OSA - EF 20-25% with moderate RV dysfunction  - cath 8/9 essentially normal coronaries  - initial PA sat 33% PAPI 1.25. Co-ox up to 61% on low-dose milrinone  - b-blocker stopped due to low output - continue digoxin and spiro - continue Entresto 24/26 - CVP 7-8 today . Give another dose of IV Lasix now and transition to PO torsemide 40 mg bid tomorrow  - stop milrinone and follow co-ox  - unable to tolerate cMRI  - CR consult   2. CAD - minimal branch vessel disease - no s/s ischemia - ASA 81, statin and SGLT2i soon    3. HL - has not tolerated atorva in past - on pitvastatin  - LDL 86 - increased pitva to 4   4. Snuff use - cessation encouraged  - suspect nicotine withdrawal. - continue w/ nicotine patch   5. Hypokalemia.  - K 3.2 - continue spiro + entresto - supp w/ KCl, discussed dosing w/ pharmacy   6. Deconditioning - lives home alone - consult PT/OT   Length of Stay: 2   Sheila [name] 10/04/2019, 9:30 AM  Advanced Heart Failure Team Pager [phone] (M-F; [place])  Please contact [facility] for night-coverage after hours (4p -7a ) and weekends on [email or web address]   Patient seen and examined with the above-signed Advanced Practice  Provider and/or Housestaff. I personally reviewed laboratory data, imaging studies and relevant notes. I independently examined the patient and formulated the important aspects of the plan. I have edited the note to reflect any of my changes or salient points. I have personally discussed the plan with the patient and/or family.  Patient seen and examined with the above-signed Advanced Practice Provider and/or Housestaff. I personally reviewed laboratory data, imaging studies and relevant notes. I independently examined the patient and formulated the important aspects of the plan. I have edited the note to reflect any of my changes or salient points. I have personally discussed the plan with the patient and/or family.  Remains on milrinone 0.125. Co-ox 61%. Diuresing well. CVP 8. Tolerating Entresto 24/26 bid. K 3.2  General:  Sitting up in bed . No resp difficulty HEENT: normal Neck: supple. JVP 8-9 Carotids 2+ bilat; no bruits. No lymphadenopathy or thryomegaly appreciated. Cor: PMI nondisplaced. Regular rate & rhythm. No rubs, gallops or murmurs. Lungs: clear Abdomen: obese soft, nontender, nondistended. No hepatosplenomegaly. No bruits or masses. Good bowel sounds. Extremities: no cyanosis, clubbing, rash, tr edema Neuro: alert & orientedx3, cranial nerves grossly intact. moves all 4 extremities w/o difficulty. Affect pleasant  Stop milrinone. Continue IV diuresis one more day. Possibly increase Entresto tomorrow. Supp K. Continue nicotine patch.   Glori Bickers, MD  1:46 PM

## 2019-10-04 NOTE — Evaluation (Signed)
Physical Therapy Evaluation Patient Details Name: Sheila [name] MRN: [id number] DOB: [birth year] Today's Date: 10/04/2019   History of Present Illness  pt is a 79 y/o female with h/o HTN, DJD, bell's palsy, admitted with episodic SOB, dyspnea on exertion and atypical chest pain.  pt had a right/left heart Cath and angiography 8/9  Clinical Impression  Pt admitted with/for SOB, atypical CP.  Pt generally at a supervision level using her rollator.  Pt currently limited functionally due to the problems listed below.  (see problems list.)  Pt will benefit from PT to maximize function and safety to be able to get home safely with available assist.     Follow Up Recommendations Home health PT;Supervision - Intermittent    Equipment Recommendations  None recommended by PT    Recommendations for Other Services       Precautions / Restrictions Precautions Precautions: None (mild fall risk)      Mobility  Bed Mobility Overal bed mobility: Modified Independent                Transfers Overall transfer level: Needs assistance   Transfers: Sit to/from Stand Sit to Stand: Supervision            Ambulation/Gait Ambulation/Gait assistance: Supervision Gait Distance (Feet): 200 Feet Assistive device: Rolling walker (2 wheeled) Gait Pattern/deviations: Step-through pattern Gait velocity: moderate Gait velocity interpretation: 1.31 - 2.62 ft/sec, indicative of limited community ambulator General Gait Details: cues to use her brakes better otherwise not assist needed.  Stairs            Wheelchair Mobility    Modified Rankin (Stroke Patients Only)       Balance Overall balance assessment: Needs assistance   Sitting balance-Leahy Scale: Good     Standing balance support: Single extremity supported;Bilateral upper extremity supported Standing balance-Leahy Scale: Poor Standing balance comment: reliant on the AD                              Pertinent Vitals/Pain Pain Assessment: No/denies pain    Home Living Family/patient expects to be discharged to:: Private residence Living Arrangements: Alone Available Help at Discharge: Family;Personal care attendant;Other (Comment);Available PRN/intermittently (PCA 6 hr/day, cousin on weekends on/off) Type of Home: Apartment Home Access: Ramped entrance     Home Layout: One level Home Equipment: Walker - 4 wheels;Toilet riser;Grab bars - tub/shower;Hand held shower head;Shower seat      Prior Function Level of Independence: Independent with assistive device(s)         Comments: sedentary     Hand Dominance        Extremity/Trunk Assessment   Upper Extremity Assessment Upper Extremity Assessment: RUE deficits/detail;LUE deficits/detail RUE Deficits / Details: limited shoulder ROM to 80* RUE Coordination: decreased fine motor LUE Deficits / Details: WFL    Lower Extremity Assessment Lower Extremity Assessment: RLE deficits/detail;LLE deficits/detail RLE Deficits / Details: general weakness at hip flexors, quads LLE Deficits / Details: general weakness hip flexors.       Communication   Communication: No difficulties  Cognition Arousal/Alertness: Awake/alert Behavior During Therapy: WFL for tasks assessed/performed Overall Cognitive Status: Within Functional Limits for tasks assessed                                        General Comments General comments (skin integrity, edema, etc.): vss,  sats on RA at 93% and HR in the lower 110's    Exercises     Assessment/Plan    PT Assessment Patient needs continued PT services  PT Problem List Decreased strength;Decreased activity tolerance;Decreased balance;Decreased mobility;Cardiopulmonary status limiting activity       PT Treatment Interventions Gait training;Functional mobility training;Therapeutic activities;Balance training;Patient/family education    PT Goals (Current goals can  be found in the Care Plan section)  Acute Rehab PT Goals Patient Stated Goal: home as soon as possible PT Goal Formulation: With patient Time For Goal Achievement: 10/11/19 Potential to Achieve Goals: Good    Frequency Min 3X/week   Barriers to discharge        Co-evaluation               AM-PAC PT "6 Clicks" Mobility  Outcome Measure Help needed turning from your back to your side while in a flat bed without using bedrails?: None Help needed moving from lying on your back to sitting on the side of a flat bed without using bedrails?: None Help needed moving to and from a bed to a chair (including a wheelchair)?: None Help needed standing up from a chair using your arms (e.g., wheelchair or bedside chair)?: None Help needed to walk in hospital room?: None Help needed climbing 3-5 steps with a railing? : A Little 6 Click Score: 23    End of Session   Activity Tolerance: Patient tolerated treatment well Patient left: in bed;with call bell/phone within reach;with family/visitor present Nurse Communication: Mobility status PT Visit Diagnosis: Unsteadiness on feet (R26.81);Other abnormalities of gait and mobility (R26.89)    Time: 1555-1630 PT Time Calculation (min) (ACUTE ONLY): 35 min   Charges:   PT Evaluation $PT Eval Moderate Complexity: 1 Mod PT Treatments $Gait Training: 8-22 mins        10/04/2019  Ginger Carne., PT Acute Rehabilitation Services [phone]  (pager) [phone]  (office)  Tessie Fass Mikhayla Phillis 10/04/2019, 4:56 PM

## 2019-10-05 ENCOUNTER — Encounter (HOSPITAL_COMMUNITY): Payer: Self-pay | Admitting: Cardiovascular Disease

## 2019-10-05 LAB — BASIC METABOLIC PANEL
Anion gap: 11 (ref 5–15)
BUN: 15 mg/dL (ref 8–23)
CO2: 29 mmol/L (ref 22–32)
Calcium: 10 mg/dL (ref 8.9–10.3)
Chloride: 101 mmol/L (ref 98–111)
Creatinine, Ser: 1.17 mg/dL — ABNORMAL HIGH (ref 0.44–1.00)
GFR calc Af Amer: 52 mL/min — ABNORMAL LOW (ref >60)
GFR calc non Af Amer: 45 mL/min — ABNORMAL LOW (ref >60)
Glucose, Bld: 118 mg/dL — ABNORMAL HIGH (ref 70–99)
Potassium: 4.1 mmol/L (ref 3.5–5.1)
Sodium: 141 mmol/L (ref 135–145)

## 2019-10-05 LAB — COOXEMETRY PANEL
Carboxyhemoglobin: 1 % (ref 0.5–1.5)
Methemoglobin: 0.6 % (ref 0.0–1.5)
O2 Saturation: 59.5 %
Total hemoglobin: 13.5 g/dL (ref 12.0–16.0)

## 2019-10-05 LAB — MAGNESIUM: Magnesium: 2.2 mg/dL (ref 1.7–2.4)

## 2019-10-05 MED ORDER — POTASSIUM CHLORIDE CRYS ER 20 MEQ PO TBCR: 20.0000 meq | EXTENDED_RELEASE_TABLET | Freq: Every day | ORAL | Status: DC

## 2019-10-05 MED ORDER — TORSEMIDE 20 MG PO TABS: 40.0000 mg | ORAL_TABLET | Freq: Every day | ORAL | 5 refills | Status: DC

## 2019-10-05 MED ORDER — SACUBITRIL-VALSARTAN 49-51 MG PO TABS
1.0000 | ORAL_TABLET | Freq: Two times a day (BID) | ORAL | Status: DC
  Administered 2019-10-05: 1 via ORAL
  Filled 2019-10-05 (×2): qty 1

## 2019-10-05 MED ORDER — SPIRONOLACTONE 25 MG PO TABS: 25.0000 mg | ORAL_TABLET | Freq: Every day | ORAL | 5 refills | Status: DC

## 2019-10-05 MED ORDER — DIGOXIN 125 MCG PO TABS: 0.1250 mg | ORAL_TABLET | Freq: Every day | ORAL | 5 refills | Status: DC

## 2019-10-05 MED ORDER — LIVALO 4 MG PO TABS: 4.0000 mg | ORAL_TABLET | Freq: Every day | ORAL | 3 refills | Status: DC

## 2019-10-05 MED ORDER — SACUBITRIL-VALSARTAN 49-51 MG PO TABS
1.0000 | ORAL_TABLET | Freq: Two times a day (BID) | ORAL | Status: DC
  Filled 2019-10-05 (×2): qty 1

## 2019-10-05 MED ORDER — ASPIRIN 81 MG PO CHEW: 81.0000 mg | CHEWABLE_TABLET | Freq: Every day | ORAL | Status: DC

## 2019-10-05 MED ORDER — NICOTINE 21 MG/24HR TD PT24: 21.0000 mg | MEDICATED_PATCH | Freq: Every day | TRANSDERMAL | 0 refills | Status: DC

## 2019-10-05 MED ORDER — POTASSIUM CHLORIDE CRYS ER 20 MEQ PO TBCR
20.0000 meq | EXTENDED_RELEASE_TABLET | Freq: Every day | ORAL | Status: DC
  Administered 2019-10-05: 20 meq via ORAL

## 2019-10-05 MED ORDER — SACUBITRIL-VALSARTAN 49-51 MG PO TABS: 1.0000 | ORAL_TABLET | Freq: Two times a day (BID) | ORAL | 5 refills | Status: DC

## 2019-10-05 MED ORDER — TORSEMIDE 20 MG PO TABS
40.0000 mg | ORAL_TABLET | Freq: Two times a day (BID) | ORAL | Status: DC
  Administered 2019-10-05: 40 mg via ORAL
  Filled 2019-10-05 (×2): qty 2

## 2019-10-05 NOTE — Discharge Summary (Signed)
Advanced Heart Failure Team  Discharge Summary   Patient ID: Sheila [name] MRN: [id number], DOB/AGE: 01-Jun-1940 79 y.o. 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 Linares a 79 y.o.woman with h/o obesity, HL, GERD, chronic snuff use and HTN who recently was seen as an outpatient by Dr. Gwenlyn Found for 2 month h/o progressive SOB. Echo on 09/20/2019 revealed EF of 20 to 25% with grade 3 diastolic dysfunction, RV moderately down moderate MR and TR. Coronary calcium score performed the same day which was 318 primarily in the LAD territory.  She was subsequently set up for [facility] on 10/02/19 that demonstrated severe NICM with EF 20-25%, Clean coronaries except for 90%D1. RHC w/ elevated filling pressures and low output. PA sat 33%. She was directly admitted from the cath lab and started on 0.125 of milrinone and IV Lasix. Co-ox improved into the 60s. She diuresed well w/ IV Lasix, with improvement in volume. She diuresed 7 lb. Transitioned off IV to PO diuretics, Torsemide 40 mg once daily. Also transitioned off milrinone w/ stable Co-ox, 60%. Was placed on GDMT w/ Mearl Latin and digoxin. Her home  blocker, coreg, was discontinued given low output.   Prior to d/c, PT assessed and recommended home health PT. Services were arranged.   On 8/12, she was seen and examined by Dr. Haroldine Laws and felt stable for discharge home. Post hospital f/u in [facility] has been arranged.   [facility] 10/02/19 Coronary Findings  Diagnostic Dominance: Co-dominant Left Anterior Descending  First Diagonal Branch  1st Diag lesion is 90% stenosed.  Intervention  No interventions have been documented.   Ao 117/81 (96) LV  114/26 RA 20 RV 45/21 PA 49/24 (32) PCW 31 Fick 2.5/1.3 Pa sat 33% PAPI 1.25   Discharge Weight Range: 220  lb Discharge Vitals: Blood pressure (!) 112/97, pulse 100, temperature 97.9 F (36.6 C), temperature source Oral, resp. rate 20, height 4\' 11"  (1.499 m), weight 99.8 kg, SpO2 96 %.  Labs: Lab Results  Component Value Date   WBC 10.2 10/03/2019   HGB 10.5 (L) 10/03/2019   HCT 34.6 (L) 10/03/2019   MCV 82.4 10/03/2019   PLT 291 10/03/2019    Recent Labs  Lab 10/05/19 0500  NA 141  K 4.1  CL 101  CO2 29  BUN 15  CREATININE 1.17*  CALCIUM 10.0  GLUCOSE 118*   Lab Results  Component Value Date   CHOL 163 07/26/2019   HDL 43 07/26/2019   LDLCALC 86 07/26/2019   TRIG 200 (H) 07/26/2019   BNP (last 3 results) No results for input(s): BNP in the last 8760 hours.  ProBNP (last 3 results) No results for input(s): PROBNP in the last 8760 hours.   Diagnostic Studies/Procedures   No results found.  Discharge Medications   Allergies as of 10/05/2019      Reactions   Ibuprofen Other (See Comments)   History of bleeding ulcers - contra-indicated   Benadryl [diphenhydramine Hcl] Other (See Comments)   Worsening depression   Lipitor [atorvastatin] Other (See Comments)   Body aches.   Hydrocodone Nausea And Vomiting   Codeine Nausea And Vomiting   Morphine And Related Nausea And Vomiting   Tdap [tetanus-diphth-acell Pertussis] Swelling   Redness and localized swelling at injection site      Medication List  STOP taking these medications   carvedilol 3.125 MG tablet Commonly known as: COREG   furosemide 20 MG tablet Commonly known as: LASIX   GOODY HEADACHE PO     TAKE these medications   albuterol (2.5 MG/3ML) 0.083% nebulizer solution Commonly known as: PROVENTIL NEBULIZE 1 VIAL EVERY 6-8 HOURS AS NEEDED FOR WHEEZING OR SHORTNESS OF BREATH What changed: See the new instructions.   ALPRAZolam 0.5 MG tablet Commonly known as: XANAX TAKE (1) TABLET TWICE A DAY. What changed:   how much to take  how to take this  when to take this  additional  instructions   aspirin 81 MG chewable tablet Chew 1 tablet (81 mg total) by mouth daily. Start taking on: October 06, 2019   Breo Ellipta 200-25 MCG/INH Aepb Generic drug: fluticasone furoate-vilanterol Inhale 1 puff into the lungs daily.   budesonide-formoterol 160-4.5 MCG/ACT inhaler Commonly known as: SYMBICORT Inhale 2 puffs into the lungs 2 (two) times daily.   cetirizine 10 MG tablet Commonly known as: ZYRTEC Take 1 tablet (10 mg total) by mouth daily.   diclofenac sodium 1 % Gel Commonly known as: VOLTAREN Apply 2 g topically 4 (four) times daily. What changed:   when to take this  reasons to take this   digoxin 0.125 MG tablet Commonly known as: LANOXIN Take 1 tablet (0.125 mg total) by mouth daily. Start taking on: October 06, 2019   DULoxetine 60 MG capsule Commonly known as: CYMBALTA TAKE 1 CAPUSLE ONCE DAILY What changed: See the new instructions.   FISH OIL OMEGA-3 PO Take 1 capsule by mouth every evening.   fluticasone 50 MCG/ACT nasal spray Commonly known as: FLONASE Place 2 sprays into both nostrils daily. What changed:   when to take this  reasons to take this   gabapentin 300 MG capsule Commonly known as: NEURONTIN TAKE (1) CAPSULE THREE TIMES DAILY. What changed: See the new instructions.   hydrOXYzine 10 MG tablet Commonly known as: ATARAX/VISTARIL TAKE  (1)  TABLET  THREE TIMES DAILY AS NEEDED.   levocetirizine 5 MG tablet Commonly known as: XYZAL TAKE 1 TABLET ONCE DAILY IN THE EVENING What changed: See the new instructions.   levothyroxine 100 MCG tablet Commonly known as: Synthroid Take 1 tablet (100 mcg total) by mouth daily.   Livalo 4 MG Tabs Generic drug: Pitavastatin Calcium Take 1 tablet (4 mg total) by mouth daily. What changed:   medication strength  how much to take   meclizine 25 MG tablet Commonly known as: ANTIVERT TAKE (1) TABLET THREE TIMES DAILY AS NEEDED FOR DIZZINESS. What changed: See the new  instructions.   multivitamin with minerals Tabs tablet Take 1 tablet by mouth daily.   nicotine 21 mg/24hr patch Commonly known as: NICODERM CQ - dosed in mg/24 hours Place 1 patch (21 mg total) onto the skin daily. Start taking on: October 06, 2019   ondansetron 4 MG tablet Commonly known as: Zofran Take 1 tablet (4 mg total) by mouth every 8 (eight) hours as needed for nausea or vomiting.   pantoprazole 20 MG tablet Commonly known as: PROTONIX TAKE 1 TABLET DAILY. MUST MAKE APPOINTMENT FOR MORE REFILLS What changed: See the new instructions.   potassium chloride SA 20 MEQ tablet Commonly known as: KLOR-CON TAKE (1) TABLET TWICE A DAY. What changed: See the new instructions.   sacubitril-valsartan 49-51 MG Commonly known as: ENTRESTO Take 1 tablet by mouth 2 (two) times daily.   spironolactone 25 MG tablet Commonly known as:  ALDACTONE Take 1 tablet (25 mg total) by mouth daily. Start taking on: October 06, 2019   torsemide 20 MG tablet Commonly known as: DEMADEX Take 2 tablets (40 mg total) by mouth daily.   triamcinolone ointment 0.5 % Commonly known as: KENALOG Apply 1 application topically 2 (two) times daily. What changed:   when to take this  reasons to take this   VISINE OP Place 1 drop into both eyes daily as needed (dry eyes/itching).   Vitamin D3 25 MCG (1000 UT) Caps Take 1,000 Units by mouth daily.       Disposition   The patient will be discharged in stable condition to home.   Follow-up Information    [facility] Follow up on 10/26/2019.   Specialty: Cardiology Why: 3:00 PM Advanced Heart Failure Clinic Parking Garage Code [phone]  Contact information: [address] [id number] [place] [phone]                Duration of Discharge Encounter: Greater than 35 minutes   Signed, Lyda Jester, PA-C 10/05/2019, 4:06 PM

## 2019-10-05 NOTE — Progress Notes (Addendum)
Advanced Heart Failure Rounding Note   Subjective:    Feels ok today. No complaints. Laying flat in bed. No orthopnea/ PND.   CVP 6 today. SCr stable.   Co-ox 60% off Milrinone. SBP 130s. NSR on tele.   Objective:   Weight Range:  Vital Signs:   Temp:  [97.7 F (36.5 C)-98 F (36.7 C)] 97.9 F (36.6 C) (08/12 1158) Pulse Rate:  [75-107] 100 (08/12 1158) Resp:  [18-20] 20 (08/12 1158) BP: (112-142)/(69-97) 112/97 (08/12 1158) SpO2:  [94 %-97 %] 96 % (08/12 1158) Last BM Date: 10/02/19  Weight change: Filed Weights   10/02/19 0745 10/03/19 0325 10/04/19 0500  Weight: 103.1 kg 100.5 kg 99.8 kg    Intake/Output:   Intake/Output Summary (Last 24 hours) at 10/05/2019 1211 Last data filed at 10/04/2019 2330 Gross per 24 hour  Intake 1200 ml  Output 1600 ml  Net -400 ml     Physical Exam: CVP 6 General:  Obese elderly WF No respiratory difficult  HEENT: normal Neck: supple. JVP ~7 cm . Carotids 2+ bilat; no bruits. No lymphadenopathy or thryomegaly appreciated. Cor: PMI nondisplaced. Regular rhythm, mildly tachy rate. No rubs, gallops or murmurs. Lungs: CTAB. No wheezing  Abdomen: obese soft, nontender, nondistended. No hepatosplenomegaly. No bruits or masses. Good bowel sounds. Extremities: no cyanosis, clubbing, rash. Obese extremities, no edema  Neuro: alert & orientedx3, cranial nerves grossly intact. moves all 4 extremities w/o difficulty. Affect pleasant but anxious   Telemetry: NSR, 90s  Personally reviewed   Labs: Basic Metabolic Panel: Recent Labs  Lab 09/28/19 1618 09/28/19 1618 10/02/19 0951 10/02/19 0956 10/02/19 1625 10/02/19 1625 10/03/19 0333 10/04/19 0500 10/05/19 0500  NA 141  --    < > 141 140  --  141 142 141  K 4.4  --    < > 4.8 3.6  --  3.1* 3.2* 4.1  CL 99  --   --   --  102  --  96* 98 101  CO2 22  --   --   --  25  --  32 31 29  GLUCOSE 100*  --   --   --  117*  --  103* 134* 118*  BUN 13  --   --   --  17  --  15 14 15     CREATININE 1.02*  --   --   --  1.07*  --  1.18* 1.00 1.17*  CALCIUM 10.7*   < >  --   --  9.7   < > 9.6 9.8 10.0  MG  --   --   --   --   --   --  1.5* 1.8 2.2   < > = values in this interval not displayed.    Liver Function Tests: No results for input(s): AST, ALT, ALKPHOS, BILITOT, PROT, ALBUMIN in the last 168 hours. No results for input(s): LIPASE, AMYLASE in the last 168 hours. No results for input(s): AMMONIA in the last 168 hours.  CBC: Recent Labs  Lab 09/28/19 1618 10/02/19 0951 10/02/19 0956 10/03/19 0333  WBC 12.4*  --   --  10.2  HGB 11.4 11.6*  11.6* 11.6* 10.5*  HCT 35.8 34.0*  34.0* 34.0* 34.6*  MCV 82  --   --  82.4  PLT 334  --   --  291    Cardiac Enzymes: No results for input(s): CKTOTAL, CKMB, CKMBINDEX, TROPONINI in the last 168 hours.  BNP: BNP (  last 3 results) No results for input(s): BNP in the last 8760 hours.  ProBNP (last 3 results) No results for input(s): PROBNP in the last 8760 hours.    Other results:  Imaging: No results found.   Medications:     Scheduled Medications: . aspirin  81 mg Oral Daily  . Chlorhexidine Gluconate Cloth  6 each Topical Daily  . digoxin  0.125 mg Oral Daily  . DULoxetine  60 mg Oral Daily  . fluticasone furoate-vilanterol  1 puff Inhalation Daily  . gabapentin  100 mg Oral TID  . levothyroxine  100 mcg Oral Daily  . nicotine  21 mg Transdermal Daily  . pantoprazole  40 mg Oral Daily  . potassium chloride  20 mEq Oral Daily  . pravastatin  80 mg Oral q1800  . sacubitril-valsartan  1 tablet Oral BID  . sodium chloride flush  10-40 mL Intracatheter Q12H  . sodium chloride flush  3 mL Intravenous Q12H  . sodium chloride flush  3 mL Intravenous Q12H  . spironolactone  25 mg Oral Daily  . tetrahydrozoline  1 drop Both Eyes BID  . torsemide  40 mg Oral BID    Infusions: . sodium chloride      PRN Medications: sodium chloride, acetaminophen, ALPRAZolam, nicotine polacrilex, ondansetron  (ZOFRAN) IV, sodium chloride flush, sodium chloride flush, traMADol   Assessment/Plan:   1. 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 Etiology unclear. HTN and OSA potential candidates but HTN not that bad and denies symptoms of OSA - EF 20-25% with moderate RV dysfunction  - cath 8/9 essentially normal coronaries  - initial PA sat 33% PAPI 1.25. started on 0.125 of Milrinone, Co-ox improved to 60s  - Co-ox stable off milrinone at 60% today  - Volume improved w/ IV Lasix, CVP 6 today. - Stop IV diuretics, transition to Torsemide 40 mg bid  - b-blocker stopped due to low output - continue digoxin and spiro - Increase Entresto to 49-51 mg bid  - consider SGLT2i as outpatient  - unable to tolerate cMRI    2. CAD - minimal branch vessel disease - no s/s ischemia - ASA 81, statin and SGLT2i soon    3. HL - has not tolerated atorva in past - on pitvastatin  - LDL 86 - increased pitva to 4   4. Snuff use - cessation encouraged  - suspect nicotine withdrawal. - continue w/ nicotine patch   5. Hypokalemia.  - K normal today at 4.1   6. Deconditioning - lives home alone - PT has recommended [facility]. Will arrange   Plan d/c home either today or tomorrow   Length of Stay: [place] PA-C 10/05/2019, 12:11 PM  Advanced Heart Failure Team Pager [phone] (M-F; [place])  Please contact [facility] for night-coverage after hours (4p -7a ) and weekends on [email or web address]   Patient seen and examined with the above-signed Advanced Practice Provider and/or Housestaff. I personally reviewed laboratory data, imaging studies and relevant notes. I independently examined the patient and formulated the important aspects of the plan. I have edited the note to reflect any of my changes or salient points. I have personally discussed the plan with the patient and/or family.  Feels ok today. Co-ox  stable off milrinone. Volume status ok. BP stable   General:  Sitting up in bed  No resp difficulty HEENT: normal Neck: supple. no JVD. Carotids  2+ bilat; no bruits. No lymphadenopathy or thryomegaly appreciated. Cor: PMI nondisplaced. Regular rate & rhythm. No rubs, gallops or murmurs. Lungs: clear Abdomen: obese soft, nontender, nondistended. No hepatosplenomegaly. No bruits or masses. Good bowel sounds. Extremities: no cyanosis, clubbing, rash, edema Neuro: alert & orientedx3, cranial nerves grossly intact. moves all 4 extremities w/o difficulty. Affect pleasant  She is stable off inotropes. Volume status and co-ox much improved. Ok for d/c today on HF meds as above Will need close f/u in HF Clinic.   Glori Bickers, MD  2:23 PM

## 2019-10-05 NOTE — Progress Notes (Signed)
CARDIAC REHAB PHASE I   PRE:  Rate/Rhythm: 93 SR  BP:  Supine:   Sitting: 124/100  Standing:    SaO2: 97%RA  MODE:  Ambulation: 300 ft   POST:  Rate/Rhythm: 117 ST  BP:  Supine:   Sitting: 108/76  Standing:    SaO2: 93%RA 1427-1508 Pt walked 300 ft on RA with her rollator and I managed IV pole and monitor. Stopped several times to rest. C/o feeling tired. Tolerated well. Left CHF booklet and low sodium diets for family and caretaker as pt stated she does not read. Discussed importance of daily weights and trying to watch sodium. Encouraged pt to notify MD if increased swelling or increased SOB. Encouraged her to try to quit snuff. Pt stated she has been using since a child and has cut back but does not think she can quit.    Graylon Good, RN BSN  10/05/2019 3:01 PM

## 2019-10-20 ENCOUNTER — Other Ambulatory Visit: Payer: Self-pay | Admitting: Family

## 2019-10-23 DIAGNOSIS — W19XXXA Unspecified fall, initial encounter: Secondary | ICD-10-CM | POA: Diagnosis not present

## 2019-10-23 DIAGNOSIS — R5381 Other malaise: Secondary | ICD-10-CM | POA: Diagnosis not present

## 2019-10-23 DIAGNOSIS — T1490XA Injury, unspecified, initial encounter: Secondary | ICD-10-CM | POA: Diagnosis not present

## 2019-10-24 ENCOUNTER — Encounter (HOSPITAL_COMMUNITY): Payer: Self-pay | Admitting: Emergency Medicine

## 2019-10-24 ENCOUNTER — Emergency Department (HOSPITAL_COMMUNITY): Payer: Medicare Other

## 2019-10-24 ENCOUNTER — Ambulatory Visit: Payer: Medicare Other

## 2019-10-24 ENCOUNTER — Emergency Department (HOSPITAL_COMMUNITY)
Admission: EM | Admit: 2019-10-24 | Discharge: 2019-10-25 | Disposition: A | Discharge status: Home / Self Care | Payer: Medicare Other | Attending: Emergency Medicine | Admitting: Emergency Medicine

## 2019-10-24 ENCOUNTER — Telehealth: Payer: Self-pay | Admitting: Physician Assistant

## 2019-10-24 ENCOUNTER — Other Ambulatory Visit: Payer: Self-pay

## 2019-10-24 DIAGNOSIS — Z87891 Personal history of nicotine dependence: Secondary | ICD-10-CM | POA: Insufficient documentation

## 2019-10-24 DIAGNOSIS — Z85828 Personal history of other malignant neoplasm of skin: Secondary | ICD-10-CM | POA: Insufficient documentation

## 2019-10-24 DIAGNOSIS — Z79890 Hormone replacement therapy (postmenopausal): Secondary | ICD-10-CM | POA: Insufficient documentation

## 2019-10-24 DIAGNOSIS — Z743 Need for continuous supervision: Secondary | ICD-10-CM | POA: Diagnosis not present

## 2019-10-24 DIAGNOSIS — R5381 Other malaise: Secondary | ICD-10-CM | POA: Diagnosis not present

## 2019-10-24 DIAGNOSIS — R0602 Shortness of breath: Secondary | ICD-10-CM | POA: Diagnosis not present

## 2019-10-24 DIAGNOSIS — G9389 Other specified disorders of brain: Secondary | ICD-10-CM | POA: Diagnosis not present

## 2019-10-24 DIAGNOSIS — R4182 Altered mental status, unspecified: Secondary | ICD-10-CM | POA: Diagnosis present

## 2019-10-24 DIAGNOSIS — R410 Disorientation, unspecified: Secondary | ICD-10-CM | POA: Diagnosis not present

## 2019-10-24 DIAGNOSIS — I499 Cardiac arrhythmia, unspecified: Secondary | ICD-10-CM | POA: Diagnosis not present

## 2019-10-24 DIAGNOSIS — Z7982 Long term (current) use of aspirin: Secondary | ICD-10-CM | POA: Diagnosis not present

## 2019-10-24 DIAGNOSIS — Z96652 Presence of left artificial knee joint: Secondary | ICD-10-CM | POA: Insufficient documentation

## 2019-10-24 DIAGNOSIS — N3000 Acute cystitis without hematuria: Secondary | ICD-10-CM | POA: Diagnosis not present

## 2019-10-24 DIAGNOSIS — I10 Essential (primary) hypertension: Secondary | ICD-10-CM | POA: Diagnosis not present

## 2019-10-24 DIAGNOSIS — E039 Hypothyroidism, unspecified: Secondary | ICD-10-CM | POA: Insufficient documentation

## 2019-10-24 DIAGNOSIS — W19XXXA Unspecified fall, initial encounter: Secondary | ICD-10-CM | POA: Diagnosis not present

## 2019-10-24 DIAGNOSIS — R000 Tachycardia, unspecified: Secondary | ICD-10-CM | POA: Diagnosis not present

## 2019-10-24 DIAGNOSIS — J449 Chronic obstructive pulmonary disease, unspecified: Secondary | ICD-10-CM | POA: Diagnosis not present

## 2019-10-24 DIAGNOSIS — Z79899 Other long term (current) drug therapy: Secondary | ICD-10-CM | POA: Diagnosis not present

## 2019-10-24 DIAGNOSIS — I6782 Cerebral ischemia: Secondary | ICD-10-CM | POA: Diagnosis not present

## 2019-10-24 LAB — CBC WITH DIFFERENTIAL/PLATELET
Abs Immature Granulocytes: 0.03 10*3/uL (ref 0.00–0.07)
Basophils Absolute: 0.1 10*3/uL (ref 0.0–0.1)
Basophils Relative: 1 %
Eosinophils Absolute: 0.3 10*3/uL (ref 0.0–0.5)
Eosinophils Relative: 3 %
HCT: 41.4 % (ref 36.0–46.0)
Hemoglobin: 12.7 g/dL (ref 12.0–15.0)
Immature Granulocytes: 0 %
Lymphocytes Relative: 25 %
Lymphs Abs: 3 10*3/uL (ref 0.7–4.0)
MCH: 25.1 pg — ABNORMAL LOW (ref 26.0–34.0)
MCHC: 30.7 g/dL (ref 30.0–36.0)
MCV: 82 fL (ref 80.0–100.0)
Monocytes Absolute: 1.1 10*3/uL — ABNORMAL HIGH (ref 0.1–1.0)
Monocytes Relative: 9 %
Neutro Abs: 7.2 10*3/uL (ref 1.7–7.7)
Neutrophils Relative %: 62 %
Platelets: 337 10*3/uL (ref 150–400)
RBC: 5.05 MIL/uL (ref 3.87–5.11)
RDW: 15.9 % — ABNORMAL HIGH (ref 11.5–15.5)
WBC: 11.7 10*3/uL — ABNORMAL HIGH (ref 4.0–10.5)
nRBC: 0 % (ref 0.0–0.2)

## 2019-10-24 LAB — COMPREHENSIVE METABOLIC PANEL
ALT: 30 U/L (ref 0–44)
AST: 34 U/L (ref 15–41)
Albumin: 4.1 g/dL (ref 3.5–5.0)
Alkaline Phosphatase: 45 U/L (ref 38–126)
Anion gap: 12 (ref 5–15)
BUN: 23 mg/dL (ref 8–23)
CO2: 25 mmol/L (ref 22–32)
Calcium: 10 mg/dL (ref 8.9–10.3)
Chloride: 101 mmol/L (ref 98–111)
Creatinine, Ser: 1.44 mg/dL — ABNORMAL HIGH (ref 0.44–1.00)
GFR calc Af Amer: 40 mL/min — ABNORMAL LOW (ref >60)
GFR calc non Af Amer: 35 mL/min — ABNORMAL LOW (ref >60)
Glucose, Bld: 129 mg/dL — ABNORMAL HIGH (ref 70–99)
Potassium: 4 mmol/L (ref 3.5–5.1)
Sodium: 138 mmol/L (ref 135–145)
Total Bilirubin: 1.1 mg/dL (ref 0.3–1.2)
Total Protein: 7.7 g/dL (ref 6.5–8.1)

## 2019-10-24 LAB — URINALYSIS, ROUTINE W REFLEX MICROSCOPIC
Bilirubin Urine: NEGATIVE
Glucose, UA: NEGATIVE mg/dL
Hgb urine dipstick: NEGATIVE
Ketones, ur: NEGATIVE mg/dL
Nitrite: NEGATIVE
Protein, ur: NEGATIVE mg/dL
Specific Gravity, Urine: 1.011 (ref 1.005–1.030)
WBC, UA: >50 WBC/hpf — ABNORMAL HIGH (ref 0–5)
pH: 5 (ref 5.0–8.0)

## 2019-10-24 MED ORDER — SODIUM CHLORIDE 0.9 % IV SOLN
1.0000 g | Freq: Once | INTRAVENOUS | Status: AC
  Administered 2019-10-24: 1 g via INTRAVENOUS
  Filled 2019-10-24: qty 10

## 2019-10-24 MED ORDER — CEPHALEXIN 500 MG PO CAPS: 500.0000 mg | ORAL_CAPSULE | Freq: Four times a day (QID) | ORAL | 0 refills | Status: DC

## 2019-10-24 NOTE — Discharge Instructions (Addendum)
Follow up with your md next week. °

## 2019-10-24 NOTE — ED Notes (Signed)
Per daughter pt has been confused all day. Daughter states she was found in the floor this am.

## 2019-10-24 NOTE — Telephone Encounter (Signed)
   Pt daughter called because pt is having AMS today, after a fall yesterday where she hit her head.   Yesterday, she was not altered and was able to walk.  Today, she is very confused, cannot do ADL's without more assistance than usual. Unable to answer questions or remember information.  Pt has an aide and is compliant w/ home rx. She has only had 1 Neurontin tab today.   Additionally, her O2 sats have been lower than usually, dropping into the 80s. She does not have home O2.  Recommended they contact EMS and have her brought to the ER. Dtr says she will do.   Rosaria Ferries, PA-C 10/24/2019 6:49 PM

## 2019-10-24 NOTE — ED Provider Notes (Signed)
[facility] EMERGENCY DEPARTMENT Provider Note   CSN: [id number] Arrival date & time: 10/24/19  2014     History Chief Complaint  Patient presents with  . Shortness of Breath    Sheila [name] is a 79 y.o. female.  Patient brought in for mild confusion.  And possibly shortness of breath.  The history is provided by the patient and a relative. No language interpreter was used.  Altered Mental Status Presenting symptoms: confusion   Severity:  Mild Most recent episode:  Today Episode history:  Multiple Timing:  Constant Progression:  Waxing and waning Chronicity:  New Context: not alcohol use   Associated symptoms: no abdominal pain, no hallucinations, no headaches, no rash and no seizures        Past Medical History:  Diagnosis Date  . Allergy   . Bell's palsy   . Cancer ([place])    skin CA on face  . Cataract   . Chest pain 07/2009   Myoveiw stress test negative.  . Depression   . Diverticulosis of colon   . DJD (degenerative joint disease) of knee   . Dyspnea    on exertion  . Family history of adverse reaction to anesthesia    sister ha nausea/vomiting  . Gastric ulcer with hemorrhage 01/14/2011  . GERD (gastroesophageal reflux disease)   . Headache   . History of fractured pelvis   . Hypercholesteremia   . Hypertension   . Hypothyroidism   . LV dysfunction   . UTI (lower urinary tract infection)     Patient Active Problem List   Diagnosis Date Noted  . LV dysfunction 10/02/2019  . NICM (nonischemic cardiomyopathy) ([place]) 10/02/2019  . Shortness of breath 08/29/2019  . Atypical chest pain 08/29/2019  . Controlled substance agreement signed 07/26/2019  . Osteoarthritis of right shoulder 02/24/2018  . Chronic bilateral low back pain 12/09/2017  . Smokeless tobacco use 11/11/2017  . COPD (chronic obstructive pulmonary disease) ([place]) 09/09/2017  . Unilateral primary osteoarthritis, right knee 03/04/2017  . Spondylolisthesis of lumbar region 08/24/2016   . Benzodiazepine dependence ([place]) 04/29/2015  . Pain medication agreement signed 04/29/2015  . Chronic back pain 04/29/2015  . Osteoarthritis 04/29/2015  . Metabolic syndrome 09/38/1829  . Morbid obesity ([place]) 10/18/2014  . Neuropathy 10/18/2014  . Hypokalemia 10/18/2014  . Depressed 09/05/2013  . Osteoporosis 06/16/2012  . PUD (peptic ulcer disease) 06/16/2012  . GAD (generalized anxiety disorder) 06/16/2012  . Hyperlipidemia 06/16/2012  . DJD (degenerative joint disease) 06/16/2012  . Hypercalcemia 06/16/2012  . Essential hypertension, benign 06/16/2012  . Gastric ulcer with hemorrhage 01/14/2011  . Diverticulosis of colon 01/13/2011  . Prerenal azotemia 01/13/2011  . Leukocytosis 01/13/2011  . Sacral insufficiency fracture 01/13/2011  . Hypothyroidism 01/13/2011  . Orthostatic lightheadedness 01/13/2011  . ABNORMAL EKG 07/16/2009    Past Surgical History:  Procedure Laterality Date  . APPENDECTOMY    . BREAST BIOPSY Left   . ESOPHAGOGASTRODUODENOSCOPY  01/14/2011   Procedure: ESOPHAGOGASTRODUODENOSCOPY (EGD);  Surgeon: Rogene Houston, MD;  Location: [facility];  Service: Endoscopy;  Laterality: N/A;  . HIP SURGERY     pelvis  . JOINT REPLACEMENT    . KNEE SURGERY  04/2010.   Total left knee replacement  . LAPAROSCOPIC APPENDECTOMY N/A 08/01/2013   Procedure: APPENDECTOMY LAPAROSCOPIC;  Surgeon: Jamesetta So, MD;  Location: [facility] ORS;  Service: General;  Laterality: N/A;  . RIGHT/LEFT HEART CATH AND CORONARY ANGIOGRAPHY N/A 10/02/2019   Procedure: RIGHT/LEFT HEART CATH AND  CORONARY ANGIOGRAPHY;  Surgeon: Lorretta Harp, MD;  Location: [place] [facility];  Service: Cardiovascular;  Laterality: N/A;     OB History   No obstetric history on file.     Family History  Problem Relation Age of Onset  . Aneurysm Mother   . Stroke Mother   . Hypertension Mother   . Cancer Father        lung  . Heart disease Sister   . Hip fracture Sister   . Cancer Brother   .  Alzheimer's disease Sister   . Cancer Sister        skin, uterine  . Cancer Brother     Social History   Tobacco Use  . Smoking status: Former Smoker    Types: Cigarettes    Quit date: 02/24/1968    Years since quitting: 51.6  . Smokeless tobacco: Current User    Types: Snuff  . Tobacco comment: quit yrs and yrs ago when she was very young  Media planner  . Vaping Use: Never used  Substance Use Topics  . Alcohol use: No  . Drug use: No    Home Medications Prior to Admission medications   Medication Sig Start Date End Date Taking? Authorizing Provider  BREO ELLIPTA 200-25 MCG/INH AEPB INHALE 1 PUFF ONCE DAILY 10/20/19   [name] A, FNP  albuterol (PROVENTIL) (2.5 MG/3ML) 0.083% nebulizer solution NEBULIZE 1 VIAL EVERY 6-8 HOURS AS NEEDED FOR WHEEZING OR SHORTNESS OF BREATH Patient taking differently: Take 2.5 mg by nebulization every 6 (six) hours as needed for wheezing or shortness of breath.  06/07/19   Sharion Balloon, FNP  ALPRAZolam Duanne Moron) 0.5 MG tablet TAKE (1) TABLET TWICE A DAY. Patient taking differently: Take 1 mg by mouth at bedtime.  07/26/19   Sharion Balloon, FNP  aspirin 81 MG chewable tablet Chew 1 tablet (81 mg total) by mouth daily. 10/06/19   Lyda Jester M, PA-C  budesonide-formoterol (SYMBICORT) 160-4.5 MCG/ACT inhaler Inhale 2 puffs into the lungs 2 (two) times daily. 07/31/19   Hassell Done, Mary-Margaret, FNP  cephALEXin (KEFLEX) 500 MG capsule Take 1 capsule (500 mg total) by mouth 4 (four) times daily. 10/24/19   Milton Ferguson, MD  cetirizine (ZYRTEC) 10 MG tablet Take 1 tablet (10 mg total) by mouth daily. Patient not taking: Reported on 10/02/2019 07/26/19   [name] A, FNP  Cholecalciferol (VITAMIN D3) 25 MCG (1000 UT) CAPS Take 1,000 Units by mouth daily.     [provider]  diclofenac sodium (VOLTAREN) 1 % GEL Apply 2 g topically 4 (four) times daily. Patient taking differently: Apply 2 g topically 4 (four) times daily as needed (pain).   09/26/18   Sharion Balloon, FNP  digoxin (LANOXIN) 0.125 MG tablet Take 1 tablet (0.125 mg total) by mouth daily. 10/06/19   Lyda Jester M, PA-C  DULoxetine (CYMBALTA) 60 MG capsule TAKE 1 CAPUSLE ONCE DAILY Patient taking differently: Take 60 mg by mouth daily.  09/21/19   Sharion Balloon, FNP  fluticasone (FLONASE) 50 MCG/ACT nasal spray Place 2 sprays into both nostrils daily. Patient taking differently: Place 2 sprays into both nostrils daily as needed for allergies or rhinitis.  07/26/19   [name] A, FNP  gabapentin (NEURONTIN) 300 MG capsule TAKE (1) CAPSULE THREE TIMES DAILY. Patient taking differently: Take 300 mg by mouth See admin instructions. Take one capsule (300 mg) by mouth three times daily - morning, 5pm and bedtime 09/20/19   [name]  A, FNP  hydrOXYzine (ATARAX/VISTARIL) 10 MG tablet TAKE  (1)  TABLET  THREE TIMES DAILY AS NEEDED. Patient not taking: Reported on 10/02/2019 05/01/19   Sharion Balloon, FNP  levocetirizine (XYZAL) 5 MG tablet TAKE 1 TABLET ONCE DAILY IN THE EVENING Patient taking differently: Take 5 mg by mouth at bedtime.  07/18/19   Sharion Balloon, FNP  levothyroxine (SYNTHROID) 100 MCG tablet Take 1 tablet (100 mcg total) by mouth daily. 07/28/19 07/27/20  Sharion Balloon, FNP  meclizine (ANTIVERT) 25 MG tablet TAKE (1) TABLET THREE TIMES DAILY AS NEEDED FOR DIZZINESS. Patient taking differently: Take 25 mg by mouth 3 (three) times daily as needed for dizziness.  09/29/19   Sharion Balloon, FNP  Multiple Vitamin (MULTIVITAMIN WITH MINERALS) TABS tablet Take 1 tablet by mouth daily.    [provider]  nicotine (NICODERM CQ - DOSED IN MG/24 HOURS) 21 mg/24hr patch Place 1 patch (21 mg total) onto the skin daily. 10/06/19   Lyda Jester M, PA-C  Omega-3 Fatty Acids (FISH OIL OMEGA-3 PO) Take 1 capsule by mouth every evening.     [provider]  ondansetron (ZOFRAN) 4 MG tablet Take 1 tablet (4 mg total) by mouth every 8 (eight)  hours as needed for nausea or vomiting. 07/31/19   Hassell Done, Mary-Margaret, FNP  pantoprazole (PROTONIX) 20 MG tablet TAKE 1 TABLET DAILY. MUST MAKE APPOINTMENT FOR MORE REFILLS Patient taking differently: Take 20 mg by mouth at bedtime.  08/23/19   Minus Liberty, PA-C  Pitavastatin Calcium (LIVALO) 4 MG TABS Take 1 tablet (4 mg total) by mouth daily. 10/05/19   Lyda Jester M, PA-C  potassium chloride SA (KLOR-CON) 20 MEQ tablet TAKE (1) TABLET TWICE A DAY. Patient taking differently: Take 20 mEq by mouth every evening.  08/01/19   Hawks, Christy A, FNP  sacubitril-valsartan (ENTRESTO) 49-51 MG Take 1 tablet by mouth 2 (two) times daily. 10/05/19   Consuelo Pandy, PA-C  spironolactone (ALDACTONE) 25 MG tablet Take 1 tablet (25 mg total) by mouth daily. 10/06/19   Lyda Jester M, PA-C  Tetrahydrozoline HCl (VISINE OP) Place 1 drop into both eyes daily as needed (dry eyes/itching).    [provider]  torsemide (DEMADEX) 20 MG tablet Take 2 tablets (40 mg total) by mouth daily. 10/05/19   Lyda Jester M, PA-C  triamcinolone ointment (KENALOG) 0.5 % Apply 1 application topically 2 (two) times daily. Patient taking differently: Apply 1 application topically 2 (two) times daily as needed (itching).  02/06/19   Loman Brooklyn, FNP    Allergies    Ibuprofen, Benadryl [diphenhydramine hcl], Lipitor [atorvastatin], Hydrocodone, Codeine, Morphine and related, and Tdap [tetanus-diphth-acell pertussis]  Review of Systems   Review of Systems  Constitutional: Negative for appetite change and fatigue.  HENT: Negative for congestion, ear discharge and sinus pressure.   Eyes: Negative for discharge.  Respiratory: Negative for cough.   Cardiovascular: Negative for chest pain.  Gastrointestinal: Negative for abdominal pain and diarrhea.  Genitourinary: Negative for frequency and hematuria.  Musculoskeletal: Negative for back pain.  Skin: Negative for rash.  Neurological:  Negative for seizures and headaches.  Psychiatric/Behavioral: Positive for confusion. Negative for hallucinations.    Physical Exam Updated Vital Signs BP 126/75   Pulse (!) 109   Temp 98.6 F (37 C) (Oral)   Resp (!) 22   Ht 4\' 11"  (1.499 m)   Wt 99 kg   SpO2 94%   BMI 44.08 kg/m  Physical Exam Vitals and nursing note reviewed.  Constitutional:      Appearance: She is well-developed.  HENT:     Head: Normocephalic.  Eyes:     General: No scleral icterus.    Conjunctiva/sclera: Conjunctivae normal.  Neck:     Thyroid: No thyromegaly.  Cardiovascular:     Rate and Rhythm: Normal rate and regular rhythm.     Heart sounds: No murmur heard.  No friction rub. No gallop.   Pulmonary:     Breath sounds: No stridor. No wheezing or rales.  Chest:     Chest wall: No tenderness.  Abdominal:     General: There is no distension.     Tenderness: There is no abdominal tenderness. There is no rebound.  Musculoskeletal:        General: Normal range of motion.     Cervical back: Neck supple.  Lymphadenopathy:     Cervical: No cervical adenopathy.  Skin:    Findings: No erythema or rash.  Neurological:     Mental Status: She is alert.     Motor: No abnormal muscle tone.     Coordination: Coordination normal.     Comments: Patient oriented to person only  Psychiatric:        Behavior: Behavior normal.     ED Results / Procedures / Treatments   Labs (all labs ordered are listed, but only abnormal results are displayed) Labs Reviewed  CBC WITH DIFFERENTIAL/PLATELET - Abnormal; Notable for the following components:      Result Value   WBC 11.7 (*)    MCH 25.1 (*)    RDW 15.9 (*)    Monocytes Absolute 1.1 (*)    All other components within normal limits  COMPREHENSIVE METABOLIC PANEL - Abnormal; Notable for the following components:   Glucose, Bld 129 (*)    Creatinine, Ser 1.44 (*)    GFR calc non Af Amer 35 (*)    GFR calc Af Amer 40 (*)    All other components  within normal limits  URINALYSIS, ROUTINE W REFLEX MICROSCOPIC - Abnormal; Notable for the following components:   APPearance CLOUDY (*)    Leukocytes,Ua LARGE (*)    WBC, UA >50 (*)    Bacteria, UA MANY (*)    Non Squamous Epithelial 6-10 (*)    All other components within normal limits  URINE CULTURE    EKG EKG Interpretation  Date/Time:  Tuesday October 24 2019 20:24:17 EDT Ventricular Rate:  101 PR Interval:    QRS Duration: 87 QT Interval:  290 QTC Calculation: 376 R Axis:   28 Text Interpretation: Sinus tachycardia Nonspecific repol abnormality, diffuse leads Confirmed by Milton Ferguson [phone]) on 10/24/2019 9:07:19 PM   Radiology CT Head Wo Contrast  Result Date: 10/24/2019 CLINICAL DATA:  Altered mental status, confusion EXAM: CT HEAD WITHOUT CONTRAST TECHNIQUE: Contiguous axial images were obtained from the base of the skull through the vertex without intravenous contrast. COMPARISON:  08/15/2015 FINDINGS: Brain: Normal anatomic configuration. Mild parenchymal volume loss is commensurate with the patient's age and is stable since prior examination. Mild periventricular and subcortical white matter changes are present likely reflecting the sequela small vessel ischemia. No abnormal intra or extra-axial mass lesion or fluid collection. No abnormal mass effect or midline shift. No evidence of acute intracranial hemorrhage or infarct. Ventricular size is normal. Cerebellum unremarkable. Vascular: Unremarkable Skull: Intact Sinuses/Orbits: Paranasal sinuses are clear. Orbits are unremarkable. Other: Mastoid air cells and middle ear cavities are clear.  IMPRESSION: 1. No acute intracranial abnormality. 2. Mild parenchymal volume loss and chronic small vessel ischemia. Electronically Signed   By: Fidela Salisbury MD   On: 10/24/2019 22:06   DG Chest Port 1 View  Result Date: 10/24/2019 CLINICAL DATA:  Shortness of breath EXAM: PORTABLE CHEST 1 VIEW COMPARISON:  None. FINDINGS: The heart  size and mediastinal contours are within normal limits. Aortic knob calcifications are seen. Both lungs are clear. The visualized skeletal structures are unremarkable. IMPRESSION: No active disease. Electronically Signed   By: Prudencio Pair M.D.   On: 10/24/2019 21:36    Procedures Procedures (including critical care time)  Medications Ordered in ED Medications  cefTRIAXone (ROCEPHIN) 1 g in sodium chloride 0.9 % 100 mL IVPB (1 g Intravenous New Bag/Given 10/24/19 2300)    ED Course  I have reviewed the triage vital signs and the nursing notes.  Pertinent labs & imaging results that were available during my care of the patient were reviewed by me and considered in my medical decision making (see chart for details).    MDM Rules/Calculators/A&P                          Patient with urinary tract infection.  She will be sent home on Keflex and follow-up with PCP      This patient presents to the ED for concern of altered mental status, this involves an extensive number of treatment options, and is a complaint that carries with it a high risk of complications and morbidity.  The differential diagnosis includes stroke infection   Lab Tests:   I Ordered, reviewed, and interpreted labs, which included CBC and chemistries which showed elevated white count UA is consistent with urinary tract infection  Medicines ordered:   I ordered medication Keflex for UTI  Imaging Studies ordered:   I ordered imaging studies which included chest x-ray and CT head  I independently visualized and interpreted imaging which showed chest x-ray unremarkable.  CT head showed some chronic small vessel ischemia  Additional history obtained:   Additional history obtained from daughter  Previous records obtained and reviewed.  Consultations Obtained:     Reevaluation:  After the interventions stated above, I reevaluated the patient and found mild improvement  Critical  Interventions:  .   Final Clinical Impression(s) / ED Diagnoses Final diagnoses:  Acute cystitis without hematuria    Rx / DC Orders ED Discharge Orders         Ordered    cephALEXin (KEFLEX) 500 MG capsule  4 times daily        10/24/19 2311           Milton Ferguson, MD 10/26/19 1017

## 2019-10-24 NOTE — ED Triage Notes (Signed)
Pt here EMS from home, family called EMS due to pt being more short of breath. Pt has no complaints at this time. Pt mental status is at her baseline per family.

## 2019-10-24 NOTE — ED Notes (Signed)
[place] (daughter)

## 2019-10-25 ENCOUNTER — Encounter: Payer: Self-pay | Admitting: Cardiovascular Disease

## 2019-10-25 ENCOUNTER — Ambulatory Visit (INDEPENDENT_AMBULATORY_CARE_PROVIDER_SITE_OTHER): Payer: Medicare Other | Admitting: Cardiovascular Disease

## 2019-10-25 DIAGNOSIS — I428 Other cardiomyopathies: Secondary | ICD-10-CM

## 2019-10-25 DIAGNOSIS — I10 Essential (primary) hypertension: Secondary | ICD-10-CM | POA: Diagnosis not present

## 2019-10-25 NOTE — Patient Instructions (Signed)
Medication Instructions:  The current medical regimen is effective;  continue present plan and medications.  *If you need a refill on your cardiac medications before your next appointment, please call your pharmacy*   Lab Work: DIG LEVEL today  If you have labs (blood work) drawn today and your tests are completely normal, you will receive your results only by: [name] MyChart Message (if you have MyChart) OR . A paper copy in the mail If you have any lab test that is abnormal or we need to change your treatment, we will call you to review the results.   Follow-Up: At [facility], you and your health needs are our priority.  As part of our continuing mission to provide you with exceptional heart care, we have created designated Provider Care Teams.  These Care Teams include your primary Cardiologist (physician) and Advanced Practice Providers (APPs -  Physician Assistants and Nurse Practitioners) who all work together to provide you with the care you need, when you need it.  We recommend signing up for the patient portal called "MyChart".  Sign up information is provided on this After Visit Summary.  MyChart is used to connect with patients for Virtual Visits (Telemedicine).  Patients are able to view lab/test results, encounter notes, upcoming appointments, etc.  Non-urgent messages can be sent to your provider as well.   To learn more about what you can do with MyChart, go to [email or web address].    Your next appointment:   Follow up APP in 2 weeks Follow up Dr.Berry in 3 months

## 2019-10-25 NOTE — Assessment & Plan Note (Signed)
History of essential hypertension with blood pressure measured today at 102/66.  She is on Entresto and spironolactone.

## 2019-10-25 NOTE — Assessment & Plan Note (Signed)
History of nonischemic cardiomyopathy cardiomyopathy with an EF in the 20 to 25% range by 2D echo 09/20/2019.  I did do a right and left heart catheter as an outpatient on 10/02/2019 revealing essentially normal coronary arteries with elevated filling pressures.  She was admitted and placed in the heart failure service by Dr. Haroldine Laws who placed her on IV inotropes and diuresed her approximately 7 pounds.  She was discharged home on 812.  Her Coreg was discontinued given her low cardiac output.  She was placed on Entresto and spironolactone.  She was seen in the ER last night because of a fall and was diagnosed with a urinary tract infection.  Her serum creatinine has gone up to 1.44.  It was 1.17  2 weeks ago.  She feels somewhat confused and lethargic.  I am going to get a serum digoxin level on her today.  She is scheduled to see Dr. Haroldine Laws in the office tomorrow.

## 2019-10-25 NOTE — Progress Notes (Signed)
10/25/2019 Sheila [name]   1941/02/10  [id number]  Primary Physician Sheila Balloon, FNP Primary Cardiologist: Sheila Harp MD Sheila [name], [place]  HPI:  Sheila [name] is a 79 y.o.  moderately overweight widowed Caucasian female mother of 2 children, grandmother of 3 grandchildren who is accompanied by her daughters Sheila [name] and Sheila [name] today. She is referred by Sheila Dun FNP for evaluation of chest pain and shortness of breath.  I last saw her in the office 09/28/2019..She does have a history of having worked in a [employer] when she was younger. She is never smoked but does not have a diagnosis of COPD. She has hyperlipidemia on Livalo. She is never had a heart attack or stroke. She complains of episodic shortness of breath, dyspnea on exertion and atypical chest pain.  I performed 2D echocardiography on her on 09/20/2019 revealing EF of 20 to 25% with grade 3 diastolic dysfunction, moderate MR and TR as well as a coronary calcium score performed the same day which was 318 primarily in the LAD territory.    Based on this I elected to proceed with outpatient right left heart cath which revealed essentially normal coronary arteries with diagonal branch disease and elevated filling pressures with low cardiac output.  I ultimately admitted her under the heart failure service and Sheila [name] took care of her.  She was discharged home on 812.  She was originally placed on IV inotropes and was diuresed 7 pounds.  She was discharged home on Entresto and spironolactone as well as digoxin.  Her carvedilol was discontinued because of low cardiac output.  She did well for a week or so at home but then started to get more lethargic.  She apparently had a fall earlier this week semimechanical she has been somewhat disoriented.  Her oxygen sats at rest today are 93%.  She was diagnosed with a urinary tract infection and was prescribed Keflex.   Current Meds  Medication Sig  . albuterol  (PROVENTIL) (2.5 MG/3ML) 0.083% nebulizer solution NEBULIZE 1 VIAL EVERY 6-8 HOURS AS NEEDED FOR WHEEZING OR SHORTNESS OF BREATH (Patient taking differently: Take 2.5 mg by nebulization every 6 (six) hours as needed for wheezing or shortness of breath. )  . ALPRAZolam (XANAX) 0.5 MG tablet TAKE (1) TABLET TWICE A DAY. (Patient taking differently: Take 1 mg by mouth at bedtime. )  . aspirin 81 MG chewable tablet Chew 1 tablet (81 mg total) by mouth daily.  [name] BREO ELLIPTA 200-25 MCG/INH AEPB INHALE 1 PUFF ONCE DAILY  . budesonide-formoterol (SYMBICORT) 160-4.5 MCG/ACT inhaler Inhale 2 puffs into the lungs 2 (two) times daily.  . cephALEXin (KEFLEX) 500 MG capsule Take 1 capsule (500 mg total) by mouth 4 (four) times daily.  . cetirizine (ZYRTEC) 10 MG tablet Take 1 tablet (10 mg total) by mouth daily.  . Cholecalciferol (VITAMIN D3) 25 MCG (1000 UT) CAPS Take 1,000 Units by mouth daily.   . diclofenac sodium (VOLTAREN) 1 % GEL Apply 2 g topically 4 (four) times daily. (Patient taking differently: Apply 2 g topically 4 (four) times daily as needed (pain). )  . digoxin (LANOXIN) 0.125 MG tablet Take 1 tablet (0.125 mg total) by mouth daily.  . DULoxetine (CYMBALTA) 60 MG capsule TAKE 1 CAPUSLE ONCE DAILY (Patient taking differently: Take 60 mg by mouth daily. )  . fluticasone (FLONASE) 50 MCG/ACT nasal spray Place 2 sprays into both nostrils daily. (Patient taking differently: Place 2 sprays into both  nostrils daily as needed for allergies or rhinitis. )  . gabapentin (NEURONTIN) 300 MG capsule TAKE (1) CAPSULE THREE TIMES DAILY. (Patient taking differently: Take 300 mg by mouth See admin instructions. Take one capsule (300 mg) by mouth three times daily - morning, 5pm and bedtime)  . hydrOXYzine (ATARAX/VISTARIL) 10 MG tablet TAKE  (1)  TABLET  THREE TIMES DAILY AS NEEDED.  [name] levocetirizine (XYZAL) 5 MG tablet TAKE 1 TABLET ONCE DAILY IN THE EVENING (Patient taking differently: Take 5 mg by mouth at  bedtime. )  . levothyroxine (SYNTHROID) 100 MCG tablet Take 1 tablet (100 mcg total) by mouth daily.  . meclizine (ANTIVERT) 25 MG tablet TAKE (1) TABLET THREE TIMES DAILY AS NEEDED FOR DIZZINESS. (Patient taking differently: Take 25 mg by mouth 3 (three) times daily as needed for dizziness. )  . Multiple Vitamin (MULTIVITAMIN WITH MINERALS) TABS tablet Take 1 tablet by mouth daily.  . nicotine (NICODERM CQ - DOSED IN MG/24 HOURS) 21 mg/24hr patch Place 1 patch (21 mg total) onto the skin daily.  . Omega-3 Fatty Acids (FISH OIL OMEGA-3 PO) Take 1 capsule by mouth every evening.   . ondansetron (ZOFRAN) 4 MG tablet Take 1 tablet (4 mg total) by mouth every 8 (eight) hours as needed for nausea or vomiting.  . pantoprazole (PROTONIX) 20 MG tablet TAKE 1 TABLET DAILY. MUST MAKE APPOINTMENT FOR MORE REFILLS (Patient taking differently: Take 20 mg by mouth at bedtime. )  . Pitavastatin Calcium (LIVALO) 4 MG TABS Take 1 tablet (4 mg total) by mouth daily.  . potassium chloride SA (KLOR-CON) 20 MEQ tablet TAKE (1) TABLET TWICE A DAY. (Patient taking differently: Take 20 mEq by mouth every evening. )  . sacubitril-valsartan (ENTRESTO) 49-51 MG Take 1 tablet by mouth 2 (two) times daily.  [name] spironolactone (ALDACTONE) 25 MG tablet Take 1 tablet (25 mg total) by mouth daily.  . Tetrahydrozoline HCl (VISINE OP) Place 1 drop into both eyes daily as needed (dry eyes/itching).  . torsemide (DEMADEX) 20 MG tablet Take 2 tablets (40 mg total) by mouth daily.  [name] triamcinolone ointment (KENALOG) 0.5 % Apply 1 application topically 2 (two) times daily. (Patient taking differently: Apply 1 application topically 2 (two) times daily as needed (itching). )   Current Facility-Administered Medications for the 10/25/19 encounter (Office Visit) with Sheila Harp, MD  Medication  . denosumab (PROLIA) injection 60 mg     Allergies  Allergen Reactions  . Ibuprofen Other (See Comments)    History of bleeding ulcers -  contra-indicated   . Benadryl [Diphenhydramine Hcl] Other (See Comments)    Worsening depression   . Lipitor [Atorvastatin] Other (See Comments)    Body aches.  . Hydrocodone Nausea And Vomiting  . Codeine Nausea And Vomiting  . Morphine And Related Nausea And Vomiting  . Tdap [Tetanus-Diphth-Acell Pertussis] Swelling    Redness and localized swelling at injection site     Social History   Socioeconomic History  . Marital status: Widowed    Spouse name: Not on file  . Number of children: 2  . Years of education: 0  . Highest education level: Never attended school  Occupational History  . Occupation: retired  Tobacco Use  . Smoking status: Former Smoker    Types: Cigarettes    Quit date: 02/24/1968    Years since quitting: 51.7  . Smokeless tobacco: Current User    Types: Snuff  . Tobacco comment: quit yrs and yrs ago when she was  very young  Media planner  . Vaping Use: Never used  Substance and Sexual Activity  . Alcohol use: No  . Drug use: No  . Sexual activity: Not Currently    Birth control/protection: Post-menopausal  Other Topics Concern  . Not on file  Social History Narrative  . Not on file   Social Determinants of Health   Financial Resource Strain: Medium Risk  . Difficulty of Paying Living Expenses: Somewhat hard  Food Insecurity: No Food Insecurity  . Worried About [name] in the Last Year: Never true  . Ran Out of Food in the Last Year: Never true  Transportation Needs: No Transportation Needs  . Lack of Transportation (Medical): No  . Lack of Transportation (Non-Medical): No  Physical Activity: Insufficiently Active  . Days of Exercise per Week: 5 days  . Minutes of Exercise per Session: 20 min  Stress: No Stress Concern Present  . Feeling of Stress : Not at all  Social Connections: Socially Isolated  . Frequency of Communication with Friends and Family: More than three times a week  . Frequency of Social Gatherings with Friends and  Family: More than three times a week  . Attends Religious Services: Never  . Active Member of Clubs or Organizations: No  . Attends Archivist Meetings: Never  . Marital Status: Widowed  Intimate Partner Violence: Not At Risk  . Fear of Current or Ex-Partner: No  . Emotionally Abused: No  . Physically Abused: No  . Sexually Abused: No     Review of Systems: General: negative for chills, fever, night sweats or weight changes.  Cardiovascular: negative for chest pain, dyspnea on exertion, edema, orthopnea, palpitations, paroxysmal nocturnal dyspnea or shortness of breath Dermatological: negative for rash Respiratory: negative for cough or wheezing Urologic: negative for hematuria Abdominal: negative for nausea, vomiting, diarrhea, bright red blood per rectum, melena, or hematemesis Neurologic: negative for visual changes, syncope, or dizziness All other systems reviewed and are otherwise negative except as noted above.    Blood pressure 102/66, [name] (!) 104, height 4\' 11"  (1.499 m), weight 210 lb 9.6 oz (95.5 kg).  General appearance: alert and no distress Neck: no adenopathy, no carotid bruit, no JVD, supple, symmetrical, trachea midline and thyroid not enlarged, symmetric, no tenderness/mass/nodules Lungs: Occasional scattered wheezes without rales Heart: regular rate and rhythm, S1, S2 normal, no murmur, click, rub or gallop Extremities: Trace peripheral edema Pulses: 2+ and symmetric Skin: Skin color, texture, turgor normal. No rashes or lesions Neurologic: Alert and oriented X 3, normal strength and tone. Normal symmetric reflexes. Normal coordination and gait  EKG sinus tachycardia 104 with evidence of LVH, reverse R wave progression and nonspecific ST and T wave changes.  She does have cope down ST segments which may be indicative of "dig effect".  I personally reviewed this EKG.  These changes are new since her last EKG on 10/03/2019.  I personally reviewed this  EKG.  ASSESSMENT AND PLAN:   Essential hypertension, benign History of essential hypertension with blood pressure measured today at 102/66.  She is on Entresto and spironolactone.  NICM (nonischemic cardiomyopathy) ([place]) History of nonischemic cardiomyopathy cardiomyopathy with an EF in the 20 to 25% range by 2D echo 09/20/2019.  I did do a right and left heart catheter as an outpatient on 10/02/2019 revealing essentially normal coronary arteries with elevated filling pressures.  She was admitted and placed in the heart failure service by Sheila [name] who placed her on  IV inotropes and diuresed her approximately 7 pounds.  She was discharged home on 812.  Her Coreg was discontinued given her low cardiac output.  She was placed on Entresto and spironolactone.  She was seen in the ER last night because of a fall and was diagnosed with a urinary tract infection.  Her serum creatinine has gone up to 1.44.  It was 1.17  2 weeks ago.  She feels somewhat confused and lethargic.  I am going to get a serum digoxin level on her today.  She is scheduled to see Sheila [name] in the office tomorrow.      Sheila Harp MD FACP,FACC,FAHA, [facility] 10/25/2019 12:02 PM

## 2019-10-25 NOTE — ED Notes (Signed)
Spoke with [name] pt's daughter about discharge instructions and she verbalized understanding.

## 2019-10-26 ENCOUNTER — Other Ambulatory Visit: Payer: Self-pay

## 2019-10-26 ENCOUNTER — Ambulatory Visit (HOSPITAL_COMMUNITY)
Admit: 2019-10-26 | Discharge: 2019-10-26 | Disposition: A | Discharge status: Home / Self Care | Payer: Medicare Other | Source: Ambulatory Visit | Attending: Cardiology | Admitting: Cardiology

## 2019-10-26 ENCOUNTER — Encounter (HOSPITAL_COMMUNITY): Payer: Self-pay

## 2019-10-26 VITALS — BP 104/72 | HR 94 | Wt 210.0 lb

## 2019-10-26 DIAGNOSIS — G4733 Obstructive sleep apnea (adult) (pediatric): Secondary | ICD-10-CM | POA: Insufficient documentation

## 2019-10-26 DIAGNOSIS — Z801 Family history of malignant neoplasm of trachea, bronchus and lung: Secondary | ICD-10-CM | POA: Insufficient documentation

## 2019-10-26 DIAGNOSIS — N390 Urinary tract infection, site not specified: Secondary | ICD-10-CM | POA: Diagnosis not present

## 2019-10-26 DIAGNOSIS — Z79899 Other long term (current) drug therapy: Secondary | ICD-10-CM | POA: Diagnosis not present

## 2019-10-26 DIAGNOSIS — Z885 Allergy status to narcotic agent status: Secondary | ICD-10-CM | POA: Diagnosis not present

## 2019-10-26 DIAGNOSIS — Z8249 Family history of ischemic heart disease and other diseases of the circulatory system: Secondary | ICD-10-CM | POA: Diagnosis not present

## 2019-10-26 DIAGNOSIS — E039 Hypothyroidism, unspecified: Secondary | ICD-10-CM | POA: Diagnosis not present

## 2019-10-26 DIAGNOSIS — Z886 Allergy status to analgesic agent status: Secondary | ICD-10-CM | POA: Insufficient documentation

## 2019-10-26 DIAGNOSIS — R627 Adult failure to thrive: Secondary | ICD-10-CM | POA: Insufficient documentation

## 2019-10-26 DIAGNOSIS — Z87440 Personal history of urinary (tract) infections: Secondary | ICD-10-CM | POA: Diagnosis not present

## 2019-10-26 DIAGNOSIS — I428 Other cardiomyopathies: Secondary | ICD-10-CM | POA: Insufficient documentation

## 2019-10-26 DIAGNOSIS — I5082 Biventricular heart failure: Secondary | ICD-10-CM | POA: Insufficient documentation

## 2019-10-26 DIAGNOSIS — Z888 Allergy status to other drugs, medicaments and biological substances status: Secondary | ICD-10-CM | POA: Diagnosis not present

## 2019-10-26 DIAGNOSIS — F329 Major depressive disorder, single episode, unspecified: Secondary | ICD-10-CM | POA: Insufficient documentation

## 2019-10-26 DIAGNOSIS — Z791 Long term (current) use of non-steroidal anti-inflammatories (NSAID): Secondary | ICD-10-CM | POA: Diagnosis not present

## 2019-10-26 DIAGNOSIS — I110 Hypertensive heart disease with heart failure: Secondary | ICD-10-CM | POA: Insufficient documentation

## 2019-10-26 DIAGNOSIS — I2510 Atherosclerotic heart disease of native coronary artery without angina pectoris: Secondary | ICD-10-CM | POA: Insufficient documentation

## 2019-10-26 DIAGNOSIS — N179 Acute kidney failure, unspecified: Secondary | ICD-10-CM | POA: Insufficient documentation

## 2019-10-26 DIAGNOSIS — I5084 End stage heart failure: Secondary | ICD-10-CM | POA: Insufficient documentation

## 2019-10-26 DIAGNOSIS — Z7982 Long term (current) use of aspirin: Secondary | ICD-10-CM | POA: Insufficient documentation

## 2019-10-26 DIAGNOSIS — I5020 Unspecified systolic (congestive) heart failure: Secondary | ICD-10-CM

## 2019-10-26 DIAGNOSIS — Z7951 Long term (current) use of inhaled steroids: Secondary | ICD-10-CM | POA: Insufficient documentation

## 2019-10-26 DIAGNOSIS — Z87891 Personal history of nicotine dependence: Secondary | ICD-10-CM | POA: Insufficient documentation

## 2019-10-26 DIAGNOSIS — E7800 Pure hypercholesterolemia, unspecified: Secondary | ICD-10-CM | POA: Insufficient documentation

## 2019-10-26 DIAGNOSIS — R5381 Other malaise: Secondary | ICD-10-CM | POA: Insufficient documentation

## 2019-10-26 DIAGNOSIS — K219 Gastro-esophageal reflux disease without esophagitis: Secondary | ICD-10-CM | POA: Insufficient documentation

## 2019-10-26 LAB — DIGOXIN LEVEL: Digoxin, Serum: 1.1 ng/mL — ABNORMAL HIGH (ref 0.5–0.9)

## 2019-10-26 NOTE — Patient Instructions (Addendum)
STOP Digoxin    Call office tomorrow with update on plan of care  If you have any questions or concerns before your next appointment please send [place] a message through [place] or call our office at [phone].    TO LEAVE A MESSAGE FOR THE NURSE SELECT OPTION 2, PLEASE LEAVE A MESSAGE INCLUDING: . YOUR NAME . DATE OF BIRTH . CALL BACK NUMBER . REASON FOR CALL**this is important as we prioritize the call backs  YOU WILL RECEIVE A CALL BACK THE SAME DAY AS LONG AS YOU CALL BEFORE 4:00 PM

## 2019-10-26 NOTE — Progress Notes (Addendum)
Advanced Heart Failure Clinic Note   Referring Physician: PCP: Sharion Balloon, FNP PCP-Cardiologist: Dr. Gwenlyn Found [facility]: Dr. Haroldine Laws   Reason for visit: Follow-up for chronic biventricular heart failure  HPI:  Ms.Hallis a 79 y.o.woman with h/oobesity,HL, GERD, chronic snuff use, HTN and recently diagnosed severe biventricular heart failure,].  She was recently seen as an outpatient by Dr. Gwenlyn Found for 2 month h/oprogressive SOB. [place] 09/20/2019 revealedEF of 20 to 25% with grade 3 diastolic dysfunction, RV moderately downmoderate MR and TR. Coronary calcium score performed the same day which was 318 primarily in the LAD territory.  She was subsequently set up for [facility] on 10/02/19 that demonstrated severe NICM with EF 20-25%, Clean coronaries except for 90% D1. RHC w/ elevated filling pressures and low output. PA sat 33%. CI 1.9. She was directly admitted from the cath lab and started on 0.125 of milrinone and IV Lasix. Co-ox improved into the 60s. She diuresed well w/ IV Lasix, with improvement in volume status. She diuresed 7 lb. Transitioned off IV to PO diuretics. Also transitioned off milrinone w/ stable Co-ox, 60%. Was placed on GDMT w/ Mearl Latin and digoxin. Her home ? blocker, coreg, was discontinued given low output. She was discharged home on 8/12 with home health PT and home health aide. Her discharge weight was 220 pounds.  Since returning home, she has done poorly. She continues with exertional dyspnea. Her niece, who also is her home health aide reports O2 sats dropping down to 88% with ambulation. She has also had increased fatigue and lethargy. She has been more confused with poor appetite, abdominal discomfort, nausea, progressive weakness as well as falls at home. She was seen in the ED 2 days ago on 8/31 for weakness and a fall. There was no loss of consciousness. Head CT was negative. She was found to have a UTI and was started on Keflex. She did not meet inpatient  criteria. She was discharged home. She had follow-up with Dr. Gwenlyn Found yesterday in clinic and he was also very concerned regarding her progressive symptoms, consistent with end-stage heart failure. He checked a digoxin level and it was elevated at 1.1. She was instructed to discontinue digoxin. Also w/ AKI, SCr had increased from 1.17>>1.44 since hospital d/c.   She presents to clinic today with her daughter and her niece, who is also her primary caregiver. She continues to have the symptoms outlined above. Very fatigued appearing, dependent on family to assist w/ ADLs. O2 sats at rest 92%. Her weight is down 11 pounds since discharge, 209 pounds today.     Review of Systems: [y] = yes, [ ]  = no   General: Weight gain [ ] ; Weight loss [Y ]; Anorexia [Y ]; Fatigue [ Y]; Fever [ ] ; Chills [ ] ; Weakness [ Y]  Cardiac: Chest pain/pressure [ ] ; Resting SOB [ ] ; Exertional SOB [Y ]; Orthopnea [ Y]; Pedal Edema [ ] ; Palpitations [ ] ; Syncope [ ] ; Presyncope [ ] ; Paroxysmal nocturnal dyspnea[ ]   Pulmonary: Cough [ ] ; Wheezing[ ] ; Hemoptysis[ ] ; Sputum [ ] ; Snoring [ ]   GI: Vomiting[ ] ; Dysphagia[ ] ; Melena[ ] ; Hematochezia [ ] ; Heartburn[ ] ; Abdominal pain [ Y]; Constipation [ ] ; Diarrhea [ ] ; BRBPR [ ]   GU: Hematuria[ ] ; Dysuria [ ] ; Nocturia[ ]   Vascular: Pain in legs with walking [ Y]; Pain in feet with lying flat [ ] ; Non-healing sores [ ] ; Stroke [ ] ; TIA [ ] ; Slurred speech [ ] ;  Neuro: Headaches[ ] ; Vertigo[ ] ;  Seizures[ ] ; Paresthesias[ ] ;Blurred vision [ ] ; Diplopia [ ] ; Vision changes [ ]   Ortho/Skin: Arthritis [ ] ; Joint pain [ ] ; Muscle pain [ ] ; Joint swelling [ ] ; Back Pain [ ] ; Rash [ ]   Psych: Depression[ ] ; Anxiety[ ]   Heme: Bleeding problems [ ] ; Clotting disorders [ ] ; Anemia [ ]   Endocrine: Diabetes [ ] ; Thyroid dysfunction[ ]    Past Medical History:  Diagnosis Date  . Allergy   . Bell's palsy   . Cancer ([place])    skin CA on face  . Cataract   . Chest pain 07/2009   Myoveiw stress  test negative.  . Depression   . Diverticulosis of colon   . DJD (degenerative joint disease) of knee   . Dyspnea    on exertion  . Family history of adverse reaction to anesthesia    sister ha nausea/vomiting  . Gastric ulcer with hemorrhage 01/14/2011  . GERD (gastroesophageal reflux disease)   . Headache   . History of fractured pelvis   . Hypercholesteremia   . Hypertension   . Hypothyroidism   . LV dysfunction   . UTI (lower urinary tract infection)     Current Outpatient Medications  Medication Sig Dispense Refill  . albuterol (PROVENTIL) (2.5 MG/3ML) 0.083% nebulizer solution NEBULIZE 1 VIAL EVERY 6-8 HOURS AS NEEDED FOR WHEEZING OR SHORTNESS OF BREATH (Patient taking differently: Take 2.5 mg by nebulization every 6 (six) hours as needed for wheezing or shortness of breath. ) 180 mL 2  . ALPRAZolam (XANAX) 0.5 MG tablet TAKE (1) TABLET TWICE A DAY. (Patient taking differently: Take 1 mg by mouth at bedtime. ) 60 tablet 5  . aspirin 81 MG chewable tablet Chew 1 tablet (81 mg total) by mouth daily.    [name] BREO ELLIPTA 200-25 MCG/INH AEPB INHALE 1 PUFF ONCE DAILY 60 each 0  . budesonide-formoterol (SYMBICORT) 160-4.5 MCG/ACT inhaler Inhale 2 puffs into the lungs 2 (two) times daily. 1 Inhaler 3  . cephALEXin (KEFLEX) 500 MG capsule Take 1 capsule (500 mg total) by mouth 4 (four) times daily. 28 capsule 0  . cetirizine (ZYRTEC) 10 MG tablet Take 1 tablet (10 mg total) by mouth daily. 30 tablet 11  . Cholecalciferol (VITAMIN D3) 25 MCG (1000 UT) CAPS Take 1,000 Units by mouth daily.     . diclofenac sodium (VOLTAREN) 1 % GEL Apply 2 g topically 4 (four) times daily. (Patient taking differently: Apply 2 g topically 4 (four) times daily as needed (pain). ) 100 g 0  . digoxin (LANOXIN) 0.125 MG tablet Take 1 tablet (0.125 mg total) by mouth daily. 30 tablet 5  . DULoxetine (CYMBALTA) 60 MG capsule TAKE 1 CAPUSLE ONCE DAILY (Patient taking differently: Take 60 mg by mouth daily. ) 90  capsule 0  . fluticasone (FLONASE) 50 MCG/ACT nasal spray Place 2 sprays into both nostrils daily. (Patient taking differently: Place 2 sprays into both nostrils daily as needed for allergies or rhinitis. ) 16 g 6  . furosemide (LASIX) 20 MG tablet Take 20 mg by mouth daily.    [name] gabapentin (NEURONTIN) 300 MG capsule TAKE (1) CAPSULE THREE TIMES DAILY. (Patient taking differently: Take 300 mg by mouth See admin instructions. Take one capsule (300 mg) by mouth three times daily - morning, 5pm and bedtime) 270 capsule 0  . hydrOXYzine (ATARAX/VISTARIL) 10 MG tablet TAKE  (1)  TABLET  THREE TIMES DAILY AS NEEDED. 30 tablet 0  . levocetirizine (XYZAL) 5 MG tablet TAKE 1  TABLET ONCE DAILY IN THE EVENING (Patient taking differently: Take 5 mg by mouth at bedtime. ) 30 tablet 5  . levothyroxine (SYNTHROID) 100 MCG tablet Take 1 tablet (100 mcg total) by mouth daily. 30 tablet 11  . meclizine (ANTIVERT) 25 MG tablet TAKE (1) TABLET THREE TIMES DAILY AS NEEDED FOR DIZZINESS. (Patient taking differently: Take 25 mg by mouth 3 (three) times daily as needed for dizziness. ) 60 tablet 0  . Multiple Vitamin (MULTIVITAMIN WITH MINERALS) TABS tablet Take 1 tablet by mouth daily.    . nicotine (NICODERM CQ - DOSED IN MG/24 HOURS) 21 mg/24hr patch Place 1 patch (21 mg total) onto the skin daily. 28 patch 0  . Omega-3 Fatty Acids (FISH OIL OMEGA-3 PO) Take 1 capsule by mouth every evening.     . ondansetron (ZOFRAN) 4 MG tablet Take 1 tablet (4 mg total) by mouth every 8 (eight) hours as needed for nausea or vomiting. 20 tablet 0  . pantoprazole (PROTONIX) 20 MG tablet TAKE 1 TABLET DAILY. MUST MAKE APPOINTMENT FOR MORE REFILLS (Patient taking differently: Take 20 mg by mouth at bedtime. ) 90 tablet 0  . Pitavastatin Calcium (LIVALO) 4 MG TABS Take 1 tablet (4 mg total) by mouth daily. 30 tablet 3  . potassium chloride SA (KLOR-CON) 20 MEQ tablet TAKE (1) TABLET TWICE A DAY. (Patient taking differently: Take 20 mEq by  mouth every evening. ) 180 tablet 0  . sacubitril-valsartan (ENTRESTO) 49-51 MG Take 1 tablet by mouth 2 (two) times daily. 60 tablet 5  . spironolactone (ALDACTONE) 25 MG tablet Take 1 tablet (25 mg total) by mouth daily. 30 tablet 5  . Tetrahydrozoline HCl (VISINE OP) Place 1 drop into both eyes daily as needed (dry eyes/itching).    . torsemide (DEMADEX) 20 MG tablet Take 2 tablets (40 mg total) by mouth daily. 60 tablet 5  . triamcinolone ointment (KENALOG) 0.5 % Apply 1 application topically 2 (two) times daily. (Patient taking differently: Apply 1 application topically 2 (two) times daily as needed (itching). ) 30 g 0   Current Facility-Administered Medications  Medication Dose Route Frequency Provider Last Rate Last Admin  . denosumab (PROLIA) injection 60 mg  60 mg Subcutaneous Q6 months [name] A, FNP   60 mg at 09/23/18 1433    Allergies  Allergen Reactions  . Ibuprofen Other (See Comments)    History of bleeding ulcers - contra-indicated   . Benadryl [Diphenhydramine Hcl] Other (See Comments)    Worsening depression   . Lipitor [Atorvastatin] Other (See Comments)    Body aches.  . Hydrocodone Nausea And Vomiting  . Codeine Nausea And Vomiting  . Morphine And Related Nausea And Vomiting  . Tdap [Tetanus-Diphth-Acell Pertussis] Swelling    Redness and localized swelling at injection site       Social History   Socioeconomic History  . Marital status: Widowed    Spouse name: Not on file  . Number of children: 2  . Years of education: 0  . Highest education level: Never attended school  Occupational History  . Occupation: retired  Tobacco Use  . Smoking status: Former Smoker    Types: Cigarettes    Quit date: 02/24/1968    Years since quitting: 51.7  . Smokeless tobacco: Current User    Types: Snuff  . Tobacco comment: quit yrs and yrs ago when she was very young  Media planner  . Vaping Use: Never used  Substance and Sexual Activity  . Alcohol  use: No  .  Drug use: No  . Sexual activity: Not Currently    Birth control/protection: Post-menopausal  Other Topics Concern  . Not on file  Social History Narrative  . Not on file   Social Determinants of Health   Financial Resource Strain: Medium Risk  . Difficulty of Paying Living Expenses: Somewhat hard  Food Insecurity: No Food Insecurity  . Worried About [name] in the Last Year: Never true  . Ran Out of Food in the Last Year: Never true  Transportation Needs: No Transportation Needs  . Lack of Transportation (Medical): No  . Lack of Transportation (Non-Medical): No  Physical Activity: Insufficiently Active  . Days of Exercise per Week: 5 days  . Minutes of Exercise per Session: 20 min  Stress: No Stress Concern Present  . Feeling of Stress : Not at all  Social Connections: Socially Isolated  . Frequency of Communication with Friends and Family: More than three times a week  . Frequency of Social Gatherings with Friends and Family: More than three times a week  . Attends Religious Services: Never  . Active Member of Clubs or Organizations: No  . Attends Archivist Meetings: Never  . Marital Status: Widowed  Intimate Partner Violence: Not At Risk  . Fear of Current or Ex-Partner: No  . Emotionally Abused: No  . Physically Abused: No  . Sexually Abused: No      Family History  Problem Relation Age of Onset  . Aneurysm Mother   . Stroke Mother   . Hypertension Mother   . Cancer Father        lung  . Heart disease Sister   . Hip fracture Sister   . Cancer Brother   . Alzheimer's disease Sister   . Cancer Sister        skin, uterine  . Cancer Brother     Vitals:   10/26/19 1522  BP: 104/72  Pulse: 94  SpO2: 92%  Weight: 95.3 kg     PHYSICAL EXAM: General: Elderly and fatigued appearing, obese. No respiratory difficulty HEENT: normal Neck: supple.  JVD ~8 cm. Carotids 2+ bilat; no bruits. No lymphadenopathy or thyromegaly appreciated.She  pre Cor: PMI nondisplaced. Regular rate & rhythm. No rubs, gallops or murmurs. Lungs: clear Abdomen: Obese, soft, nondistended +RUQ tenderness. No hepatosplenomegaly. No bruits or masses. Good bowel sounds. Extremities: no cyanosis, clubbing, rash, obese lower extremities, extensive varicose veins, trace bilateral LE Neuro: alert & oriented x 3, cranial nerves grossly intact. moves all 4 extremities w/o difficulty. Affect pleasant.  ECG: Not performed   ASSESSMENT & PLAN:  1. Stage D Biventricular Heart Failure:  - due to severe NICM with biventricular dysfunction. Etiology unclear.HTN and OSA potential candidates but HTN not that bad and denies symptoms of OSA - Echo 7/21: EF 20-25% with moderate RV dysfunction  - LHC cath 8/21 w/ essentially normal coronaries  - RHC w/ low output, initial PA sat 33% PAPI 1.25. CI 1.9, required milrinone  -She continues to struggle with NYHA a IV symptoms and progressive decline consistent with end-stage congestive heart failure -We discussed in detail various treatment options with patient and family. Due to advanced age and tobacco use, she is not a candidate for cardiac transplant. Due to age and other comorbidities she is not a suitable candidate for LVAD. -She was presented the option of home inotropic therapy with milrinone versus palliative care. She would like to further consider both options and discuss  further with family. She will contact our office again tomorrow with the decision, home milrinone versus palliative care consultation. -We will continue current therapies for now with the exception of digoxin. Will discontinue due to elevated dig level, 1.1. -Continue Entresto 49-51 twice daily -Continue spironolactone 25 mg daily -No beta-blocker with low output -Continue Lasix 20 mg daily  2. CAD - minimal branch vessel disease, 90% D1 on recent cath, all other vessels normal  - no s/s ischemia - continue asa + statin   3. Chronic Hypoxic  Respiratory Railure -In the setting of end-stage heart failure -She has had reported drop in O2 sats down to 88% with ambulation per home health aide. O2 sats at rest in clinic today 92% on room air  -We will discuss with PCP coordination of home O2  4. AKI  -Bump in SCr from 1.17>>1.44 since hospital d/c. -suspect 2/2 low output/ cardiorenal syndrome   5. Tobacco Abuse - uses snuff   6. UTI - UA+ 8/31  - UC growing Enterococus Faecalis, started on abx  - denies dysuria. No fever or chills  - Continue abx therapy w/ Kelfex 500 mg bid x 7 days  7. Debility/ Failure To Thrive  - Stage D Heart Failure - She will consider transition to palliative care   Addendum: Pt's family contacted [facility] on 9/3 w/ updated plan. They have decided that they wish to proceed w/ palliative care. We will place consult to palliative care to team for home visit.   Lyda Jester, PA-C 10/26/19   Patient seen and examined with the above-signed Advanced Practice Provider and/or Housestaff. I personally reviewed laboratory data, imaging studies and relevant notes. I independently examined the patient and formulated the important aspects of the plan. I have edited the note to reflect any of my changes or salient points. I have personally discussed the plan with the patient and/or family.  79 y/o woman with obesity, COPD. Admitted several weeks ago with new onset systolic HF 79%. Cath with essentially normal coronary arteries with low cardiac output. Treated with milrinone and IV lasix without much improvement. Recently seen by Dr. Gwenlyn Found last week and feeling poorly with class IV symptoms, AKI and falls. She presents with her daughter and her niece for HF follow-up.   She continues to struggle. Very weak. SOB with any exertion. Volume status ok.  General:  Weak appearing. No resp difficulty HEENT: normal Neck: supple. no JVD. Carotids 2+ bilat; no bruits. No lymphadenopathy or thryomegaly appreciated. Cor:  PMI nondisplaced. Regular rate & rhythm. No rubs, gallops or murmurs. Lungs: markedly decreased breath sounds Abdomen: obese soft, nontender, nondistended. No hepatosplenomegaly. No bruits or masses. Good bowel sounds. Extremities: no cyanosis, clubbing, rash, edema Neuro: alert & orientedx3, cranial nerves grossly intact. moves all 4 extremities w/o difficulty. Affect pleasant  She has end-stage HF (stage D). I had a lengthy talk with her and her family about the options typically available to patients with her level of HF - transplant, VAD, home inotropes or hospice). She is not a candidate for transplant or VAD so we focused on a trial of home inotropes (She did not have much improvement with milrinone as an inpatient) versus hospice care. They are willing to have a hospice consult to discuss these services and we will arrange.   Total time spent 45 minutes. Over half that time spent discussing above.   Glori Bickers, MD  9:54 PM

## 2019-10-27 ENCOUNTER — Telehealth (HOSPITAL_COMMUNITY): Payer: Self-pay | Admitting: Cardiology

## 2019-10-27 ENCOUNTER — Encounter (HOSPITAL_COMMUNITY): Payer: Self-pay

## 2019-10-27 LAB — URINE CULTURE: Culture: >=100000 — AB

## 2019-10-27 NOTE — Telephone Encounter (Signed)
Patients daughter, Jeani Hawking returned call to office to report family and patient are ready to proceed with palliative referral  Advised would fax referral today, and intake would call patient to schedule a home visit to complete process. If family would like to be present for conversation it would be best to be present at this time. Lyn voiced understanding and appreciative of returned call  Order/referral faxed to [facility] [phone]

## 2019-10-28 ENCOUNTER — Telehealth: Payer: Self-pay

## 2019-10-28 NOTE — Progress Notes (Signed)
ED Antimicrobial Stewardship Positive Culture Follow Up   PIYA MESCH is an 79 y.o. female who presented to [facility] on 10/24/2019 with a chief complaint of  Chief Complaint  Patient presents with  . Shortness of Breath    Recent Results (from the past 720 hour(s))  SARS CORONAVIRUS 2 (TAT 6-24 HRS) Nasopharyngeal Nasopharyngeal Swab     Status: None   Collection Time: 09/29/19  1:22 PM   Specimen: Nasopharyngeal Swab  Result Value Ref Range Status   SARS Coronavirus 2 NEGATIVE NEGATIVE Final    Comment: (NOTE) SARS-CoV-2 target nucleic acids are NOT DETECTED.  The SARS-CoV-2 RNA is generally detectable in upper and lower respiratory specimens during the acute phase of infection. Negative results do not preclude SARS-CoV-2 infection, do not rule out co-infections with other pathogens, and should not be used as the sole basis for treatment or other patient management decisions. Negative results must be combined with clinical observations, patient history, and epidemiological information. The expected result is Negative.  Fact Sheet for Patients: SugarRoll.be  Fact Sheet for Healthcare Providers: https://www.woods-mathews.com/  This test is not yet approved or cleared by the [place] FDA and  has been authorized for detection and/or diagnosis of SARS-CoV-2 by FDA under an Emergency Use Authorization (EUA). This EUA will remain  in effect (meaning this test can be used) for the duration of the COVID-19 declaration under Se ction 564(b)(1) of the Act, 21 U.S.C. section 360bbb-3(b)(1), unless the authorization is terminated or revoked sooner.  Performed at [facility], [place] [address], [place], [place] [address]   Urine Culture     Status: Abnormal   Collection Time: 10/24/19  9:05 PM   Specimen: Urine, Random  Result Value Ref Range Status   Specimen Description   Final    URINE, RANDOM Performed at [facility], [address] [place], [place] [address]    Special Requests   Final    NONE Performed at [facility], [address] [place], [place] [address]    Culture >=100,000 COLONIES/mL ENTEROCOCCUS FAECALIS (A)  Final   Report Status 10/27/2019 FINAL  Final   Organism ID, Bacteria ENTEROCOCCUS FAECALIS (A)  Final      Susceptibility   Enterococcus faecalis - MIC*    AMPICILLIN <=2 SENSITIVE Sensitive     NITROFURANTOIN <=16 SENSITIVE Sensitive     VANCOMYCIN 1 SENSITIVE Sensitive     * >=100,000 COLONIES/mL ENTEROCOCCUS FAECALIS    [x]  Treated with cephalexin, organism resistant to prescribed antimicrobial []  Patient discharged originally without antimicrobial agent and treatment is now indicated  New antibiotic prescription: If pt with UTI symptoms, DC cephalexin and start amoxicillin 875mg  PO BID x 5 days  ED Provider: Rodell Perna, PA-C   Maddock, Rande Lawman 10/28/2019, 10:15 AM Clinical Pharmacist Monday - Friday phone -  [phone] Saturday - Sunday phone - [phone]

## 2019-10-28 NOTE — Telephone Encounter (Signed)
Post ED Visit - Positive Culture Follow-up: Unsuccessful Patient Follow-up  Culture assessed and recommendations reviewed by:  []  Elenor Quinones, Pharm.D. []  Heide Guile, Pharm.D., BCPS AQ-ID []  Parks Neptune, Pharm.D., BCPS []  Alycia Rossetti, Pharm.D., BCPS []  [place], Pharm.D., BCPS, AAHIVP []  Legrand Como, Pharm.D., BCPS, AAHIVP []  Wynell Balloon, PharmD []  Vincenza Hews, PharmD, BCPS Apolonio Schneiders Rumbarger Pharm D Positive urine culture  []  Patient discharged without antimicrobial prescription and treatment is now indicated [x]  Organism is resistant to prescribed ED discharge antimicrobial []  Patient with positive blood cultures  Needs Amoxicillin 875 mg BID x 5 days per [facility]  And stop Cephalexin Unable to contact patient after 3 attempts, letter will be sent to address on file  Genia Del 10/28/2019, 10:54 AM

## 2019-11-01 ENCOUNTER — Other Ambulatory Visit: Payer: Self-pay | Admitting: Family

## 2019-11-01 ENCOUNTER — Ambulatory Visit: Payer: Medicare Other | Admitting: Licensed Clinical Social Worker

## 2019-11-01 ENCOUNTER — Telehealth: Payer: Self-pay | Admitting: Cardiovascular Disease

## 2019-11-01 DIAGNOSIS — J441 Chronic obstructive pulmonary disease with (acute) exacerbation: Secondary | ICD-10-CM

## 2019-11-01 DIAGNOSIS — F411 Generalized anxiety disorder: Secondary | ICD-10-CM

## 2019-11-01 DIAGNOSIS — M159 Polyosteoarthritis, unspecified: Secondary | ICD-10-CM

## 2019-11-01 DIAGNOSIS — I10 Essential (primary) hypertension: Secondary | ICD-10-CM

## 2019-11-01 DIAGNOSIS — E785 Hyperlipidemia, unspecified: Secondary | ICD-10-CM

## 2019-11-01 DIAGNOSIS — M170 Bilateral primary osteoarthritis of knee: Secondary | ICD-10-CM

## 2019-11-01 DIAGNOSIS — M8949 Other hypertrophic osteoarthropathy, multiple sites: Secondary | ICD-10-CM

## 2019-11-01 NOTE — Chronic Care Management (AMB) (Addendum)
Chronic Care Management    Clinical Social Work Follow Up Note  11/01/2019 Name: Sheila [name] MRN: [id number] DOB: [birth year]  Sheila [name] is a 79 y.o. year old female who is a primary care patient of Sharion Balloon, FNP. The CCM team was consulted for assistance with [employer] .   Review of patient status, including review of consultants reports, other relevant assessments, and collaboration with appropriate care team members and the patient's provider was performed as part of comprehensive patient evaluation and provision of chronic care management services.    SDOH (Social Determinants of Health) assessments performed: No; risk for social isolation; risk for tobacco use; risk for depression; risk for stress; risk for physical inactivity    Office Visit from 07/26/2019 in [facility]  PHQ-9 Total Score 6      GAD 7 : Generalized Anxiety Score 07/26/2019 02/22/2019 05/05/2018 04/29/2015  Nervous, Anxious, on Edge 1 1 1 1   Control/stop worrying 0 1 0 0  Worry too much - different things 0 1 0 0  Trouble relaxing 0 1 0 0  Restless 0 1 0 0  Easily annoyed or irritable 0 0 0 0  Afraid - awful might happen 0 1 0 0  Total GAD 7 Score 1 6 1 1   Anxiety Difficulty Not difficult at all Somewhat difficult Not difficult at all -     Outpatient Encounter Medications as of 11/01/2019  Medication Sig Note   albuterol (PROVENTIL) (2.5 MG/3ML) 0.083% nebulizer solution NEBULIZE 1 VIAL EVERY 6-8 HOURS AS NEEDED FOR WHEEZING OR SHORTNESS OF BREATH (Patient taking differently: Take 2.5 mg by nebulization every 6 (six) hours as needed for wheezing or shortness of breath. )    ALPRAZolam (XANAX) 0.5 MG tablet TAKE (1) TABLET TWICE A DAY. (Patient taking differently: Take 1 mg by mouth at bedtime. ) 10/02/2019: Pt takes 2 at bedtime (not twice daily) per niece   aspirin 81 MG chewable tablet Chew 1 tablet (81 mg total) by mouth daily.    BREO ELLIPTA 200-25 MCG/INH AEPB INHALE 1  PUFF ONCE DAILY    budesonide-formoterol (SYMBICORT) 160-4.5 MCG/ACT inhaler Inhale 2 puffs into the lungs 2 (two) times daily.    cephALEXin (KEFLEX) 500 MG capsule Take 1 capsule (500 mg total) by mouth 4 (four) times daily.    cetirizine (ZYRTEC) 10 MG tablet Take 1 tablet (10 mg total) by mouth daily. 10/02/2019: Pt is only taking xyzal - not zyrtec   Cholecalciferol (VITAMIN D3) 25 MCG (1000 UT) CAPS Take 1,000 Units by mouth daily.     diclofenac sodium (VOLTAREN) 1 % GEL Apply 2 g topically 4 (four) times daily. (Patient taking differently: Apply 2 g topically 4 (four) times daily as needed (pain). )    DULoxetine (CYMBALTA) 60 MG capsule TAKE 1 CAPUSLE ONCE DAILY (Patient taking differently: Take 60 mg by mouth daily. )    fluticasone (FLONASE) 50 MCG/ACT nasal spray Place 2 sprays into both nostrils daily. (Patient taking differently: Place 2 sprays into both nostrils daily as needed for allergies or rhinitis. )    furosemide (LASIX) 20 MG tablet Take 20 mg by mouth daily.    gabapentin (NEURONTIN) 300 MG capsule TAKE (1) CAPSULE THREE TIMES DAILY. (Patient taking differently: Take 300 mg by mouth See admin instructions. Take one capsule (300 mg) by mouth three times daily - morning, 5pm and bedtime)    hydrOXYzine (ATARAX/VISTARIL) 10 MG tablet TAKE  (1)  TABLET  THREE  TIMES DAILY AS NEEDED. 10/02/2019: Pt was taking this at night for sleep but ran out several months ago and MD did not refill   levocetirizine (XYZAL) 5 MG tablet TAKE 1 TABLET ONCE DAILY IN THE EVENING (Patient taking differently: Take 5 mg by mouth at bedtime. )    levothyroxine (SYNTHROID) 100 MCG tablet Take 1 tablet (100 mcg total) by mouth daily.    meclizine (ANTIVERT) 25 MG tablet TAKE (1) TABLET THREE TIMES DAILY AS NEEDED FOR DIZZINESS. (Patient taking differently: Take 25 mg by mouth 3 (three) times daily as needed for dizziness. )    Multiple Vitamin (MULTIVITAMIN WITH MINERALS) TABS tablet Take 1 tablet by mouth daily.     nicotine (NICODERM CQ - DOSED IN MG/24 HOURS) 21 mg/24hr patch Place 1 patch (21 mg total) onto the skin daily.    Omega-3 Fatty Acids (FISH OIL OMEGA-3 PO) Take 1 capsule by mouth every evening.     ondansetron (ZOFRAN) 4 MG tablet Take 1 tablet (4 mg total) by mouth every 8 (eight) hours as needed for nausea or vomiting. 10/02/2019: Needs refill   pantoprazole (PROTONIX) 20 MG tablet TAKE 1 TABLET DAILY. MUST MAKE APPOINTMENT FOR MORE REFILLS (Patient taking differently: Take 20 mg by mouth at bedtime. )    Pitavastatin Calcium (LIVALO) 4 MG TABS Take 1 tablet (4 mg total) by mouth daily.    potassium chloride SA (KLOR-CON) 20 MEQ tablet TAKE (1) TABLET TWICE A DAY. (Patient taking differently: Take 20 mEq by mouth every evening. )    sacubitril-valsartan (ENTRESTO) 49-51 MG Take 1 tablet by mouth 2 (two) times daily.    spironolactone (ALDACTONE) 25 MG tablet Take 1 tablet (25 mg total) by mouth daily.    Tetrahydrozoline HCl (VISINE OP) Place 1 drop into both eyes daily as needed (dry eyes/itching).    torsemide (DEMADEX) 20 MG tablet Take 2 tablets (40 mg total) by mouth daily.    triamcinolone ointment (KENALOG) 0.5 % Apply 1 application topically 2 (two) times daily. (Patient taking differently: Apply 1 application topically 2 (two) times daily as needed (itching). )    Facility-Administered Encounter Medications as of 11/01/2019  Medication   denosumab (PROLIA) injection 60 mg    Goals       Client will talk with LCSW about social work needs of client (pt-stated)      Current Barriers:  Ambulation challenges COPD Breathing challenges in client with Chronic Diagnoses of HTN, DJD, HLD, GAD, Osteoarthritis,and COPD  Clinical Social Work Clinical Goal(s):  LCSW will talk with client in next 30 days to discuss the social work needs of client at that time  Interventions: Talked with Krista Blue, daughter of client,  about ambulation needs of client (uses walker to ambulate) Talked with  [name] about client breathing challenges and her use of nebulizer machine Talked with Krista Blue about energy level of client Talked with Krista Blue, daughter of client , about upcoming client appointment with Palliative Care. Talked with [name] about recent fall of client Encouraged [name] or [name] to call [facility] as needed for nursing support Talked with [name] about pain issues of client Talked with [name] about sleeping issues of client Talked with [name] about nursing support through [facility] Triage Nurse Talked with [name] about vision issues of client Collaborated with RNCM about nursing needs of client  Patient Self Care Activities:  Self administers medications as prescribed Attends all scheduled provider appointments  Patient Self Care Deficits Unable to perform IADLs independently  Initial goal documentation     Follow Up Plan: LCSW to call client/[name]/Kathy Mabe in next 4 weeks to talk with client or [name] or [name] about the social work needs of client at that time  Norva Riffle.Darryll Raju MSW, LCSW Licensed Clinical Social Worker [facility] Care Management [phone]  I have reviewed the CCM documentation and agree with the written assessment and plan of care.  [name], FNP

## 2019-11-01 NOTE — Telephone Encounter (Signed)
Detailed message left reviewed pt's chart and does not appear any clinical staff has called pt, but scheduling  could have called as reminder for upcoming appt on 11/06/19 at 2:00 pm

## 2019-11-01 NOTE — Telephone Encounter (Signed)
New message:      patient daughter calling stating that some one called and do not see a note. Please call back.

## 2019-11-01 NOTE — Patient Instructions (Addendum)
Licensed Clinical Social Worker Visit Information  Goals we discussed today:   Client will talk with LCSW about social work needs of client (pt-stated)         Current Barriers:   Ambulation challenges  COPD  Breathing challenges in client with Chronic Diagnoses of HTN, DJD, HLD, GAD, Osteoarthritis,and COPD  Clinical Social Work Clinical Goal(s):   LCSW will talk with client in next 30 days to discuss the social work needs of client at that time  Interventions:  Talked with Krista Blue, daughter of client,  about ambulation needs of client (uses walker to ambulate)  Talked with [name] about client breathing challenges and her use of nebulizer machine  Talked with Krista Blue about energy level of client  Talked with Krista Blue, daughter of client , about upcoming client appointment with Palliative Care.  Talked with [name] about recent fall of client  Encouraged [name] or [name] to call [facility] as needed for nursing support  Talked with [name] about pain issues of client  Talked with [name] about sleeping issues of client  Talked with [name] about nursing support through  [facility] Triage Nurse  Talked with [name] about vision issues of client  Collaborated with RNCM about nursing needs of client  Patient Self Care Activities:   Self administers medications as prescribed  Attends all scheduled provider appointments   Patient Self Care Deficits  Unable to perform IADLs independently  Initial goal documentation     Follow Up Plan: LCSW to call client/[name]/[name] next 4 weeks to talk with client or [name] or [name] about the social work needs of client at that time  Materials Provided: No  [employer], daughter of client, verbalized understanding of instructions provided today and declined a print copy of patient instruction materials.   Norva Riffle.Lanee Chain MSW, LCSW Licensed Clinical Social Worker [facility]  Care Management [phone]

## 2019-11-03 ENCOUNTER — Telehealth (HOSPITAL_COMMUNITY): Payer: Self-pay | Admitting: Cardiology

## 2019-11-03 ENCOUNTER — Telehealth: Payer: Self-pay | Admitting: Nurse Practitioner

## 2019-11-03 NOTE — Telephone Encounter (Signed)
pts daughter left vm stating palliative care had not contacted them. I contacted palliative care and they stated they received the referral and will have the scheduler contact the patient today.

## 2019-11-03 NOTE — Telephone Encounter (Signed)
Spoke with patient's daughter, Lucella Pommier, regarding Palliative services and all questions were answered and she was in agreement with scheduling a visit with NP.  I have scheduled an In-person Consult for 11/07/19 @ 2 PM

## 2019-11-06 ENCOUNTER — Telehealth: Payer: Self-pay

## 2019-11-06 ENCOUNTER — Encounter: Payer: Self-pay | Admitting: Cardiovascular Disease

## 2019-11-06 ENCOUNTER — Ambulatory Visit: Payer: Medicare Other

## 2019-11-06 ENCOUNTER — Ambulatory Visit (INDEPENDENT_AMBULATORY_CARE_PROVIDER_SITE_OTHER): Payer: Medicare Other | Admitting: Cardiovascular Disease

## 2019-11-06 ENCOUNTER — Other Ambulatory Visit: Payer: Self-pay

## 2019-11-06 DIAGNOSIS — I428 Other cardiomyopathies: Secondary | ICD-10-CM | POA: Diagnosis not present

## 2019-11-06 NOTE — Patient Instructions (Signed)
Medication Instructions:  Your physician recommends that you continue on your current medications as directed. Please refer to the Current Medication list given to you today.  *If you need a refill on your cardiac medications before your next appointment, please call your pharmacy*  Lab Work: NONE ordered at this time of appointment   If you have labs (blood work) drawn today and your tests are completely normal, you will receive your results only by:  [facility] (if you have MyChart) OR  A paper copy in the mail If you have any lab test that is abnormal or we need to change your treatment, we will call you to review the results.  Testing/Procedures: NONE ordered at this time of appointment   Follow-Up: At [facility], you and your health needs are our priority.  As part of our continuing mission to provide you with exceptional heart care, we have created designated Provider Care Teams.  These Care Teams include your primary Cardiologist (physician) and Advanced Practice Providers (APPs -  Physician Assistants and Nurse Practitioners) who all work together to provide you with the care you need, when you need it.  We recommend signing up for the patient portal called "MyChart".  Sign up information is provided on this After Visit Summary.  MyChart is used to connect with patients for Virtual Visits (Telemedicine).  Patients are able to view lab/test results, encounter notes, upcoming appointments, etc.  Non-urgent messages can be sent to your provider as well.   To learn more about what you can do with MyChart, go to [email or web address].    Your next appointment:   3 month(s) 6 MONTHS   The format for your next appointment:   In Person IN PERSON   Provider:   Coletta Memos, FNP Quay Burow, MD  Other Instructions

## 2019-11-06 NOTE — Telephone Encounter (Signed)
Called to tell the pt that the appt w/pharmd isn't needed and to still come for her visit w/dr berry but I had to leave a msg

## 2019-11-06 NOTE — Assessment & Plan Note (Signed)
Sheila [name] was referred to me for symptoms of congestive heart failure.  I performed right and left heart cath on her 10/02/2019 revealing essentially nonischemic cardiomyopathy with high filling pressures.  EF was 20 to 25% with moderate MR and TR.  She was admitted to the hospital and placed on optimal medical therapy, diuresed by Dr. Haroldine Laws.  I last saw her on 9/1 which time she was somewhat confused and her digoxin level is 1.1.  This was subsequent discontinued.  She was seen in the heart failure clinic the following day and was placed on palliative care who is scheduled to see her tomorrow.

## 2019-11-06 NOTE — Progress Notes (Signed)
11/06/2019 KATHELEEN STELLA   1940/09/03  [id number]  Primary Physician Sharion Balloon, FNP Primary Cardiologist: Lorretta Harp MD Garret Reddish, [place], [place]  HPI:  Sheila [name] is a 79 y.o.  moderately overweight widowed Caucasian female mother of 2 children, grandmother of 3 grandchildren who is accompanied by one of her daughters today. She is referred by [name] FNP for evaluation of chest pain and shortness of breath.I last saw her in the office 09/28/2019..She does have a history of having worked in a [employer] when she was younger. She is never smoked but does not have a diagnosis of COPD. She has hyperlipidemia on Livalo. She is never had a heart attack or stroke. She complains of episodic shortness of breath, dyspnea on exertion and atypical chest pain.  I performed 2D echocardiography on her on 09/20/2019 revealing EF of 20 to 25% with grade 3 diastolic dysfunction, moderate MR and TR as well as a coronary calcium score performed the same day which was 318 primarily in the LAD territory.   Based on this I elected to proceed with outpatient right left heart cath which revealed essentially normal coronary arteries with diagonal branch disease and elevated filling pressures with low cardiac output.  I ultimately admitted her under the heart failure service and Dr. Haroldine Laws took care of her.  She was discharged home on 812.  She was originally placed on IV inotropes and was diuresed 7 pounds.  She was discharged home on Entresto and spironolactone as well as digoxin.  Her carvedilol was discontinued because of low cardiac output.  She did well for a week or so at home but then started to get more lethargic.  She apparently had a fall earlier this week semimechanical she has been somewhat disoriented.  Her oxygen sats at rest today are 93%.  She was diagnosed with a urinary tract infection and was prescribed Keflex.  3 months with a PA  When I saw her on 10/25/2019 she was  somewhat confused.  A digoxin level was obtained which was 1.1 and this was discontinued.  She saw Dr. Haroldine Laws in the heart failure clinic the following day who recommended that she we pursue palliative care which I agree with.  She is minimally ambulatory and for the most part wheelchair-bound with symptoms of chronic systolic heart failure.   Current Meds  Medication Sig   albuterol (PROVENTIL) (2.5 MG/3ML) 0.083% nebulizer solution NEBULIZE 1 VIAL EVERY 6-8 HOURS AS NEEDED FOR WHEEZING OR SHORTNESS OF BREATH (Patient taking differently: Take 2.5 mg by nebulization every 6 (six) hours as needed for wheezing or shortness of breath. )   ALPRAZolam (XANAX) 0.5 MG tablet TAKE (1) TABLET TWICE A DAY. (Patient taking differently: Take 1 mg by mouth at bedtime. )   aspirin 81 MG chewable tablet Chew 1 tablet (81 mg total) by mouth daily.   BREO ELLIPTA 200-25 MCG/INH AEPB INHALE 1 PUFF ONCE DAILY   budesonide-formoterol (SYMBICORT) 160-4.5 MCG/ACT inhaler Inhale 2 puffs into the lungs 2 (two) times daily.   cephALEXin (KEFLEX) 500 MG capsule Take 1 capsule (500 mg total) by mouth 4 (four) times daily.   cetirizine (ZYRTEC) 10 MG tablet Take 1 tablet (10 mg total) by mouth daily.   Cholecalciferol (VITAMIN D3) 25 MCG (1000 UT) CAPS Take 1,000 Units by mouth daily.    diclofenac sodium (VOLTAREN) 1 % GEL Apply 2 g topically 4 (four) times daily. (Patient taking differently: Apply 2 g topically  4 (four) times daily as needed (pain). )   DULoxetine (CYMBALTA) 60 MG capsule TAKE 1 CAPUSLE ONCE DAILY (Patient taking differently: Take 60 mg by mouth daily. )   fluticasone (FLONASE) 50 MCG/ACT nasal spray Place 2 sprays into both nostrils daily. (Patient taking differently: Place 2 sprays into both nostrils daily as needed for allergies or rhinitis. )   furosemide (LASIX) 20 MG tablet Take 20 mg by mouth daily.   gabapentin (NEURONTIN) 300 MG capsule TAKE (1) CAPSULE THREE TIMES DAILY. (Patient  taking differently: Take 300 mg by mouth See admin instructions. Take one capsule (300 mg) by mouth three times daily - morning, 5pm and bedtime)   hydrOXYzine (ATARAX/VISTARIL) 10 MG tablet TAKE  (1)  TABLET  THREE TIMES DAILY AS NEEDED.   levocetirizine (XYZAL) 5 MG tablet TAKE 1 TABLET ONCE DAILY IN THE EVENING (Patient taking differently: Take 5 mg by mouth at bedtime. )   levothyroxine (SYNTHROID) 100 MCG tablet Take 1 tablet (100 mcg total) by mouth daily.   meclizine (ANTIVERT) 25 MG tablet TAKE (1) TABLET THREE TIMES DAILY AS NEEDED FOR DIZZINESS. (Patient taking differently: Take 25 mg by mouth 3 (three) times daily as needed for dizziness. )   Multiple Vitamin (MULTIVITAMIN WITH MINERALS) TABS tablet Take 1 tablet by mouth daily.   nicotine (NICODERM CQ - DOSED IN MG/24 HOURS) 21 mg/24hr patch Place 1 patch (21 mg total) onto the skin daily.   Omega-3 Fatty Acids (FISH OIL OMEGA-3 PO) Take 1 capsule by mouth every evening.    ondansetron (ZOFRAN) 4 MG tablet Take 1 tablet (4 mg total) by mouth every 8 (eight) hours as needed for nausea or vomiting.   pantoprazole (PROTONIX) 20 MG tablet TAKE 1 TABLET DAILY. MUST MAKE APPOINTMENT FOR MORE REFILLS (Patient taking differently: Take 20 mg by mouth at bedtime. )   Pitavastatin Calcium (LIVALO) 4 MG TABS Take 1 tablet (4 mg total) by mouth daily.   potassium chloride SA (KLOR-CON) 20 MEQ tablet TAKE (1) TABLET TWICE A DAY.   sacubitril-valsartan (ENTRESTO) 49-51 MG Take 1 tablet by mouth 2 (two) times daily.   spironolactone (ALDACTONE) 25 MG tablet Take 1 tablet (25 mg total) by mouth daily.   Tetrahydrozoline HCl (VISINE OP) Place 1 drop into both eyes daily as needed (dry eyes/itching).   torsemide (DEMADEX) 20 MG tablet Take 2 tablets (40 mg total) by mouth daily.   traMADol (ULTRAM) 50 MG tablet Take 50-100 mg by mouth every 12 (twelve) hours as needed.   triamcinolone ointment (KENALOG) 0.5 % Apply 1 application topically  2 (two) times daily. (Patient taking differently: Apply 1 application topically 2 (two) times daily as needed (itching). )   Current Facility-Administered Medications for the 11/06/19 encounter (Office Visit) with Lorretta Harp, MD  Medication   denosumab (PROLIA) injection 60 mg     Allergies  Allergen Reactions   Ibuprofen Other (See Comments)    History of bleeding ulcers - contra-indicated    Benadryl [Diphenhydramine Hcl] Other (See Comments)    Worsening depression    Lipitor [Atorvastatin] Other (See Comments)    Body aches.   Hydrocodone Nausea And Vomiting   Codeine Nausea And Vomiting   Morphine And Related Nausea And Vomiting   Tdap [Tetanus-Diphth-Acell Pertussis] Swelling    Redness and localized swelling at injection site     Social History   Socioeconomic History   Marital status: Widowed    Spouse name: Not on file   Number of  children: 2   Years of education: 0   Highest education level: Never attended school  Occupational History   Occupation: retired  Tobacco Use   Smoking status: Former Smoker    Types: Cigarettes    Quit date: 02/24/1968    Years since quitting: 51.7   Smokeless tobacco: Current User    Types: Snuff   Tobacco comment: quit yrs and yrs ago when she was very young  Media planner   Vaping Use: Never used  Substance and Sexual Activity   Alcohol use: No   Drug use: No   Sexual activity: Not Currently    Birth control/protection: Post-menopausal  Other Topics Concern   Not on file  Social History Narrative   Not on file   Social Determinants of Health   Financial Resource Strain: Medium Risk   Difficulty of Paying Living Expenses: Somewhat hard  Food Insecurity: No Food Insecurity   Worried About [name] in the Last Year: Never true   Ran Out of Food in the Last Year: Never true  Transportation Needs: No Transportation Needs   Lack of Transportation (Medical): No   Lack of  Transportation (Non-Medical): No  Physical Activity: Insufficiently Active   Days of Exercise per Week: 5 days   Minutes of Exercise per Session: 20 min  Stress: No Stress Concern Present   Feeling of Stress : Not at all  Social Connections: Socially Isolated   Frequency of Communication with Friends and Family: More than three times a week   Frequency of Social Gatherings with Friends and Family: More than three times a week   Attends Religious Services: Never   Marine scientist or Organizations: No   Attends Archivist Meetings: Never   Marital Status: Widowed  Human resources officer Violence: Not At Risk   Fear of Current or Ex-Partner: No   Emotionally Abused: No   Physically Abused: No   Sexually Abused: No     Review of Systems: General: negative for chills, fever, night sweats or weight changes.  Cardiovascular: negative for chest pain, dyspnea on exertion, edema, orthopnea, palpitations, paroxysmal nocturnal dyspnea or shortness of breath Dermatological: negative for rash Respiratory: negative for cough or wheezing Urologic: negative for hematuria Abdominal: negative for nausea, vomiting, diarrhea, bright red blood per rectum, melena, or hematemesis Neurologic: negative for visual changes, syncope, or dizziness All other systems reviewed and are otherwise negative except as noted above.    Blood pressure 124/70, pulse (!) 121, height 4\' 11"  (1.499 m).  General appearance: alert and no distress Neck: no adenopathy, no carotid bruit, no JVD, supple, symmetrical, trachea midline and thyroid not enlarged, symmetric, no tenderness/mass/nodules Lungs: clear to auscultation bilaterally Heart: regular rate and rhythm, S1, S2 normal, no murmur, click, rub or gallop Extremities: extremities normal, atraumatic, no cyanosis or edema Pulses: 2+ and symmetric Skin: Skin color, texture, turgor normal. No rashes or lesions Neurologic: Alert and oriented X 3,  normal strength and tone. Normal symmetric reflexes. Normal coordination and gait  EKG sinus tachycardia 121 with diffuse ST and T wave changes.  I personally reviewed this EKG.  ASSESSMENT AND PLAN:   NICM (nonischemic cardiomyopathy) ([place]) Ms. Colvard was referred to me for symptoms of congestive heart failure.  I performed right and left heart cath on her 10/02/2019 revealing essentially nonischemic cardiomyopathy with high filling pressures.  EF was 20 to 25% with moderate MR and TR.  She was admitted to the hospital and placed on optimal  medical therapy, diuresed by Dr. Haroldine Laws.  I last saw her on 9/1 which time she was somewhat confused and her digoxin level is 1.1.  This was subsequent discontinued.  She was seen in the heart failure clinic the following day and was placed on palliative care who is scheduled to see her tomorrow.      Lorretta Harp MD FACP,FACC,FAHA, [facility] 11/06/2019 4:40 PM

## 2019-11-07 ENCOUNTER — Other Ambulatory Visit: Payer: Medicare Other | Admitting: Nurse Practitioner

## 2019-11-07 DIAGNOSIS — I509 Heart failure, unspecified: Secondary | ICD-10-CM

## 2019-11-07 DIAGNOSIS — Z515 Encounter for palliative care: Secondary | ICD-10-CM

## 2019-11-07 NOTE — Progress Notes (Signed)
[facility] Consult Note Telephone: [phone]  Fax: [phone]  PATIENT NAME: Sheila [name] [address] [place] [address] [phone] (home)  DOB: [birth year] MRN: [id number]  PRIMARY CARE PROVIDER:    Sharion Balloon, FNP,  [address] [place] [address] [phone]  REFERRING PROVIDER:   Sharion Balloon, [address] [place],  [place] [address] [phone]  RESPONSIBLE PARTY:   Extended Emergency Contact Information Primary Emergency Contact: [place], [place] Phone: [phone] Mobile Phone: [phone] Relation: Niece Secondary Emergency Contact: Josue Hector [place] Phone: [phone] Mobile Phone: [phone] Relation: Daughter  I met face to face with patient and family in home.  ASSESSMENT AND RECOMMENDATIONS:   1. Advance Care Planning/Goals of Care: Goals include to maximize quality of life and symptom management. Our advance care planning conversation included a discussion about:     The value and importance of advance care planning   Experiences with loved ones who have been seriously ill or have died   Exploration of personal, cultural or spiritual beliefs that might influence medical decisions   Exploration of goals of care in the event of a sudden injury or illness   Review and updating or creation of an  advance directive document . Visit consisted of building trust and discussions on Palliative care medicine as specialized medical care for people living with serious illness, aimed at facilitating better quality of life through symptoms relief, assisting with advance care plan and establishing goals of care. Daughters Sheila [name], two other family member and patient's personal care giver present at the visit. Family expressed appreciation for education provided on Palliative care and how it differs from Hospice service. Palliative care will  continue to provide support to patient, family and the medical team.   Directives: After discussions on importance and implications of having a code status patient elected to be a full Code. Patient reiterated desire for full spectrum of care including mechanical ventilation in the event of cardiac or respiratory arrest. Support and validation provided. Patient and family made aware, explained that code status will be regularly reviewed to ensure that it reflects patient wishes. Patient and family open to further discussion on code status and possible changes in the future. MOST form was discussed and completed, details include Limited additional interventions, antibiotics if indicated, IV fluid if indicated, feeding tube long-term if indicated. Goals of care : Goals of care is comfort and function. Patient want to die at home with her family around her.  2. Symptom Management: Patient with COPD and HF (Ef 20-25%). Per family, patient experiencing increasing fatigue and lethargy. Patient with severe activity intolerance, dependent on family for all of her ADLs. Patient with poor appetite. Family report 17lbs weight loss in the last 3 months. Family report patient experienceing difficulty staying asleep. Recommended starting patient on Melatonin 48m daily at 5pm. Patient having audible wheeze, patient has order for albuterol neb but only uses it once a day. Patient advised to use the Albuterol neb TID for one week and continue every 4 hrs as needed for wheezing or SOB. Patient and family verbalized understanding. Family asked if patient can take a walk around her house. Recommended patient participate in activity as tolerated. Family asking for order for patient to receive more care giver hours form personal care aide. Advise family to discuss need with patient's PCP.  3. Follow up Palliative Care Visit: Palliative care will continue to  follow for goals of care clarification and symptom management. Return 4  weeks or prn.  4. Family /Caregiver/Community Supports: Patient has very supportive family, daughters assist in providing care for patient. Patient's niece Sheila [name] who is a CNA, is a paid caregiver for patient during the day.  5. Cognitive / Functional decline: Patient fell 2 weeks ago with no injury. She is dependent on family/caregiver for her ADLs. She is alert and oriented, able to make her own decisions.  I spent 80 minutes providing this consultation, time includes time spent with patient, family, chart review, provider coordination, and documentation. More than 50% of the time in this consultation was spent coordinating communication.   HISTORY OF PRESENT ILLNESS:  Sheila [name] is a 79 y.o. year old female with multiple medical problems including COPD, anxiety (on xanax), Chronic pain, DJD. Patient was hospitalized 10/24/2019 for UTI and SOB. Palliative Care was asked to follow this patient by consultation request of Sharion Balloon, FNP to help address advance care planning and goals of care. Palliative Care was asked to help address goals of care. This is an initail visit.  PPS: 50%  HOSPICE ELIGIBILITY/DIAGNOSIS: TBD  PAST MEDICAL HISTORY:  Past Medical History:  Diagnosis Date   Allergy    Bell's palsy    Cancer ([place])    skin CA on face   Cataract    Chest pain 07/2009   Myoveiw stress test negative.   Depression    Diverticulosis of colon    DJD (degenerative joint disease) of knee    Dyspnea    on exertion   Family history of adverse reaction to anesthesia    sister ha nausea/vomiting   Gastric ulcer with hemorrhage 01/14/2011   GERD (gastroesophageal reflux disease)    Headache    History of fractured pelvis    Hypercholesteremia    Hypertension    Hypothyroidism    LV dysfunction    UTI (lower urinary tract infection)     SOCIAL HX:  Social History   Tobacco Use   Smoking status: Former Smoker    Types: Cigarettes    Quit date: 02/24/1968      Years since quitting: 51.7   Smokeless tobacco: Current User    Types: Snuff   Tobacco comment: quit yrs and yrs ago when she was very young  Substance Use Topics   Alcohol use: No   FAMILY HX:  Family History  Problem Relation Age of Onset   Aneurysm Mother    Stroke Mother    Hypertension Mother    Cancer Father        lung   Heart disease Sister    Hip fracture Sister    Cancer Brother    Alzheimer's disease Sister    Cancer Sister        skin, uterine   Cancer Brother     ALLERGIES:  Allergies  Allergen Reactions   Ibuprofen Other (See Comments)    History of bleeding ulcers - contra-indicated    Benadryl [Diphenhydramine Hcl] Other (See Comments)    Worsening depression    Lipitor [Atorvastatin] Other (See Comments)    Body aches.   Hydrocodone Nausea And Vomiting   Codeine Nausea And Vomiting   Morphine And Related Nausea And Vomiting   Tdap [Tetanus-Diphth-Acell Pertussis] Swelling    Redness and localized swelling at injection site      PERTINENT MEDICATIONS:  Outpatient Encounter Medications as of 11/07/2019  Medication Sig   albuterol (PROVENTIL) (2.5  MG/3ML) 0.083% nebulizer solution NEBULIZE 1 VIAL EVERY 6-8 HOURS AS NEEDED FOR WHEEZING OR SHORTNESS OF BREATH (Patient taking differently: Take 2.5 mg by nebulization every 6 (six) hours as needed for wheezing or shortness of breath. )   ALPRAZolam (XANAX) 0.5 MG tablet TAKE (1) TABLET TWICE A DAY. (Patient taking differently: Take 1 mg by mouth at bedtime. )   aspirin 81 MG chewable tablet Chew 1 tablet (81 mg total) by mouth daily.   BREO ELLIPTA 200-25 MCG/INH AEPB INHALE 1 PUFF ONCE DAILY   budesonide-formoterol (SYMBICORT) 160-4.5 MCG/ACT inhaler Inhale 2 puffs into the lungs 2 (two) times daily.   cephALEXin (KEFLEX) 500 MG capsule Take 1 capsule (500 mg total) by mouth 4 (four) times daily.   cetirizine (ZYRTEC) 10 MG tablet Take 1 tablet (10 mg total) by mouth daily.    Cholecalciferol (VITAMIN D3) 25 MCG (1000 UT) CAPS Take 1,000 Units by mouth daily.    diclofenac sodium (VOLTAREN) 1 % GEL Apply 2 g topically 4 (four) times daily. (Patient taking differently: Apply 2 g topically 4 (four) times daily as needed (pain). )   DULoxetine (CYMBALTA) 60 MG capsule TAKE 1 CAPUSLE ONCE DAILY (Patient taking differently: Take 60 mg by mouth daily. )   fluticasone (FLONASE) 50 MCG/ACT nasal spray Place 2 sprays into both nostrils daily. (Patient taking differently: Place 2 sprays into both nostrils daily as needed for allergies or rhinitis. )   furosemide (LASIX) 20 MG tablet Take 20 mg by mouth daily.   gabapentin (NEURONTIN) 300 MG capsule TAKE (1) CAPSULE THREE TIMES DAILY. (Patient taking differently: Take 300 mg by mouth See admin instructions. Take one capsule (300 mg) by mouth three times daily - morning, 5pm and bedtime)   hydrOXYzine (ATARAX/VISTARIL) 10 MG tablet TAKE  (1)  TABLET  THREE TIMES DAILY AS NEEDED.   levocetirizine (XYZAL) 5 MG tablet TAKE 1 TABLET ONCE DAILY IN THE EVENING (Patient taking differently: Take 5 mg by mouth at bedtime. )   levothyroxine (SYNTHROID) 100 MCG tablet Take 1 tablet (100 mcg total) by mouth daily.   meclizine (ANTIVERT) 25 MG tablet TAKE (1) TABLET THREE TIMES DAILY AS NEEDED FOR DIZZINESS. (Patient taking differently: Take 25 mg by mouth 3 (three) times daily as needed for dizziness. )   Multiple Vitamin (MULTIVITAMIN WITH MINERALS) TABS tablet Take 1 tablet by mouth daily.   nicotine (NICODERM CQ - DOSED IN MG/24 HOURS) 21 mg/24hr patch Place 1 patch (21 mg total) onto the skin daily.   Omega-3 Fatty Acids (FISH OIL OMEGA-3 PO) Take 1 capsule by mouth every evening.    ondansetron (ZOFRAN) 4 MG tablet Take 1 tablet (4 mg total) by mouth every 8 (eight) hours as needed for nausea or vomiting.   pantoprazole (PROTONIX) 20 MG tablet TAKE 1 TABLET DAILY. MUST MAKE APPOINTMENT FOR MORE REFILLS (Patient taking  differently: Take 20 mg by mouth at bedtime. )   Pitavastatin Calcium (LIVALO) 4 MG TABS Take 1 tablet (4 mg total) by mouth daily.   potassium chloride SA (KLOR-CON) 20 MEQ tablet TAKE (1) TABLET TWICE A DAY.   sacubitril-valsartan (ENTRESTO) 49-51 MG Take 1 tablet by mouth 2 (two) times daily.   spironolactone (ALDACTONE) 25 MG tablet Take 1 tablet (25 mg total) by mouth daily.   Tetrahydrozoline HCl (VISINE OP) Place 1 drop into both eyes daily as needed (dry eyes/itching).   torsemide (DEMADEX) 20 MG tablet Take 2 tablets (40 mg total) by mouth daily.   traMADol (  ULTRAM) 50 MG tablet Take 50-100 mg by mouth every 12 (twelve) hours as needed.   triamcinolone ointment (KENALOG) 0.5 % Apply 1 application topically 2 (two) times daily. (Patient taking differently: Apply 1 application topically 2 (two) times daily as needed (itching). )   Facility-Administered Encounter Medications as of 11/07/2019  Medication   denosumab (PROLIA) injection 60 mg    PHYSICAL EXAM / ROS:  V/s: BP 96/62, HR 87, 96% RA Current and past weights: 210, BMI 42.4kg/m2 General: well appearing, siting in recliner in NAD Cardiovascular: denied chest pain, 1+ edema, S1S2 normal Pulmonary: no cough, denied SOB, room air Abdomen: appetite fair, denied constipation, incontinent of bowel GU: denies dysuria, incontinent of urine MSK:  no joint and ROM abnormalities, ambulatory Skin: no rashes or wounds reported or noted on exposed skin Neurological: weakness, but otherwise nonfocal    Jari Favre, DNP, AGPCNP-BC

## 2019-11-07 NOTE — Addendum Note (Signed)
Encounter addended by: Jolaine Artist, MD on: 11/07/2019 9:56 PM  Actions taken: Level of Service modified, Clinical Note Signed

## 2019-11-10 ENCOUNTER — Ambulatory Visit: Payer: Medicare Other | Admitting: Medical

## 2019-11-13 ENCOUNTER — Telehealth: Payer: Self-pay | Admitting: Family

## 2019-11-14 NOTE — Telephone Encounter (Signed)
LMTCB

## 2019-11-14 NOTE — Telephone Encounter (Signed)
Sheila [name], is this something you can do? I am not sure how I can request this.

## 2019-11-14 NOTE — Telephone Encounter (Signed)
TC to [employer] w/ [employer] CAP[employer] program who her AID is through, she will contact the family regarding this, she maybe able to get her the extra 2 hours of help

## 2019-11-15 ENCOUNTER — Telehealth: Payer: Self-pay | Admitting: Family

## 2019-11-15 NOTE — Telephone Encounter (Signed)
Ok, do they have any recommendations?

## 2019-11-16 ENCOUNTER — Telehealth: Payer: Self-pay | Admitting: Family

## 2019-11-16 NOTE — Telephone Encounter (Signed)
Patient aware and verbalized understanding. °

## 2019-11-16 NOTE — Telephone Encounter (Signed)
Can we verify patient was contacted about these changes?

## 2019-11-17 ENCOUNTER — Other Ambulatory Visit: Payer: Self-pay | Admitting: Family

## 2019-11-17 MED ORDER — AMOXICILLIN 875 MG PO TABS: 875.0000 mg | ORAL_TABLET | Freq: Two times a day (BID) | ORAL | 0 refills | Status: DC

## 2019-11-18 ENCOUNTER — Other Ambulatory Visit (INDEPENDENT_AMBULATORY_CARE_PROVIDER_SITE_OTHER): Payer: Self-pay | Admitting: Internal Medicine

## 2019-11-22 NOTE — Telephone Encounter (Signed)
No return call will close encounter. 

## 2019-11-23 ENCOUNTER — Encounter: Payer: Self-pay | Admitting: Family

## 2019-11-23 ENCOUNTER — Other Ambulatory Visit: Payer: Self-pay

## 2019-11-23 ENCOUNTER — Ambulatory Visit (INDEPENDENT_AMBULATORY_CARE_PROVIDER_SITE_OTHER): Payer: Medicare Other | Admitting: Family

## 2019-11-23 VITALS — BP 130/83 | HR 112 | Temp 97.5°F | Ht 59.0 in | Wt 208.8 lb

## 2019-11-23 DIAGNOSIS — I10 Essential (primary) hypertension: Secondary | ICD-10-CM

## 2019-11-23 DIAGNOSIS — K219 Gastro-esophageal reflux disease without esophagitis: Secondary | ICD-10-CM

## 2019-11-23 DIAGNOSIS — Z87440 Personal history of urinary (tract) infections: Secondary | ICD-10-CM

## 2019-11-23 DIAGNOSIS — F1320 Sedative, hypnotic or anxiolytic dependence, uncomplicated: Secondary | ICD-10-CM

## 2019-11-23 DIAGNOSIS — M545 Low back pain, unspecified: Secondary | ICD-10-CM

## 2019-11-23 DIAGNOSIS — J441 Chronic obstructive pulmonary disease with (acute) exacerbation: Secondary | ICD-10-CM

## 2019-11-23 DIAGNOSIS — E039 Hypothyroidism, unspecified: Secondary | ICD-10-CM

## 2019-11-23 DIAGNOSIS — F411 Generalized anxiety disorder: Secondary | ICD-10-CM

## 2019-11-23 DIAGNOSIS — Z79899 Other long term (current) drug therapy: Secondary | ICD-10-CM

## 2019-11-23 DIAGNOSIS — Z0289 Encounter for other administrative examinations: Secondary | ICD-10-CM

## 2019-11-23 DIAGNOSIS — R0602 Shortness of breath: Secondary | ICD-10-CM

## 2019-11-23 DIAGNOSIS — Z7689 Persons encountering health services in other specified circumstances: Secondary | ICD-10-CM | POA: Diagnosis not present

## 2019-11-23 DIAGNOSIS — F331 Major depressive disorder, recurrent, moderate: Secondary | ICD-10-CM

## 2019-11-23 DIAGNOSIS — N390 Urinary tract infection, site not specified: Secondary | ICD-10-CM | POA: Diagnosis not present

## 2019-11-23 DIAGNOSIS — Z1159 Encounter for screening for other viral diseases: Secondary | ICD-10-CM | POA: Diagnosis not present

## 2019-11-23 DIAGNOSIS — E782 Mixed hyperlipidemia: Secondary | ICD-10-CM

## 2019-11-23 DIAGNOSIS — G8929 Other chronic pain: Secondary | ICD-10-CM

## 2019-11-23 MED ORDER — PANTOPRAZOLE SODIUM 20 MG PO TBEC: 20.0000 mg | DELAYED_RELEASE_TABLET | Freq: Every day | ORAL | 2 refills | Status: DC

## 2019-11-23 MED ORDER — ALPRAZOLAM 0.5 MG PO TABS: ORAL_TABLET | ORAL | 5 refills | Status: DC

## 2019-11-23 NOTE — Patient Instructions (Signed)
Heart Failure, Diagnosis  Heart failure is a condition in which the heart has trouble pumping blood because it has become weak or stiff. This means that the heart does not pump blood well enough for the body to stay healthy. For some people with heart failure, fluid may back up into the lungs. There may also be swelling (edema) in the lower legs. Heart failure is usually a long-term (chronic) condition. It is important for you to take good care of yourself and follow the treatment plan from your health care provider. What are the causes? This condition may be caused by:  High blood pressure (hypertension). Hypertension causes the heart muscle to work harder than normal. This makes the heart stiff or weak.  Coronary artery disease, or CAD. CAD is the buildup of cholesterol and fat (plaque) in the arteries of the heart.  Heart attack, also called myocardial infarction. This injures the heart muscle, making it hard for the heart to pump blood.  Abnormal heart valves. The valves do not open and close properly, forcing the heart to pump harder to keep the blood flowing.  Heart muscle disease (cardiomyopathy or myocarditis). This is damage to the heart muscle. It can increase the risk of heart failure.  Lung disease. The heart works harder when the lungs are not healthy.  Abnormal heart rhythms. These can lead to heart failure. What increases the risk? The risk of heart failure increases as a person ages. This condition is also more likely to develop in people who:  Are overweight.  Are female.  Smoke or chew tobacco.  Abuse alcohol or illegal drugs.  Have taken medicines that can damage the heart, such as chemotherapy drugs.  Have diabetes.  Have abnormal heart rhythms.  Have thyroid problems.  Have low blood counts (anemia). What are the signs or symptoms? Symptoms of this condition include:  Shortness of breath with activity, such as when climbing stairs.  A cough that does not  go away.  Swelling of the feet, ankles, legs, or abdomen.  Losing weight for no reason.  Trouble breathing when lying flat (orthopnea).  Waking from sleep because of the need to sit up and get more air.  Rapid heartbeat.  Tiredness (fatigue) and loss of energy.  Feeling light-headed, dizzy, or close to fainting.  Loss of appetite.  Nausea.  Waking up more often during the night to urinate (nocturia).  Confusion. How is this diagnosed? This condition is diagnosed based on:  Your medical history, symptoms, and a physical exam.  Diagnostic tests, which may include: ? Echocardiogram. ? Electrocardiogram (ECG). ? Chest X-ray. ? Blood tests. ? Exercise stress test. ? Radionuclide scans. ? Cardiac catheterization and angiogram. How is this treated? Treatment for this condition is aimed at managing the symptoms of heart failure. Medicines Treatment may include medicines that:  Help lower blood pressure by relaxing (dilating) the blood vessels. These medicines are called ACE inhibitors (angiotensin-converting enzyme) and ARBs (angiotensin receptor blockers).  Cause the kidneys to remove salt and water from the blood through urination (diuretics).  Improve heart muscle strength and prevent the heart from beating too fast (beta blockers).  Increase the force of the heartbeat (digoxin). Healthy behavior changes     Treatment may also include making healthy lifestyle changes, such as:  Reaching and staying at a healthy weight.  Quitting smoking or chewing tobacco.  Eating heart-healthy foods.  Limiting or avoiding alcohol.  Stopping the use of illegal drugs.  Being physically active.  Other treatments   Other treatments may include:  Procedures to open blocked arteries or repair damaged valves.  Placing a pacemaker to improve heart function (cardiac resynchronization therapy).  Placing a device to treat serious abnormal heart rhythms (implantable cardioverter  defibrillator, or ICD).  Placing a device to improve the pumping ability of the heart (left ventricular assist device, or LVAD).  Receiving a healthy heart from a donor (heart transplant). This is done when other treatments have not helped. Follow these instructions at home:  Manage other health conditions as told by your health care provider. These may include hypertension, diabetes, thyroid disease, or abnormal heart rhythms.  Get ongoing education and support as needed. Learn as much as you can about heart failure.  Keep all follow-up visits as told by your health care provider. This is important. Summary  Heart failure is a condition in which the heart has trouble pumping blood because it has become weak or stiff.  This condition is caused by high blood pressure and other diseases of the heart and lungs.  Symptoms of this condition include shortness of breath, tiredness (fatigue), nausea, and swelling of the feet, ankles, legs, or abdomen.  Treatments for this condition may include medicines, lifestyle changes, and surgery.  Manage other health conditions as told by your health care provider. This information is not intended to replace advice given to you by your health care provider. Make sure you discuss any questions you have with your health care provider. Document Revised: 04/29/2018 Document Reviewed: 04/29/2018 Elsevier Patient Education  2020 Elsevier Inc.  

## 2019-11-23 NOTE — Progress Notes (Signed)
Subjective:    Patient ID: Sheila [name], female    DOB: 12/07/1940, 79 y.o.   MRN: [id number]  Chief Complaint  Patient presents with  . Medical Management of Chronic Issues   Pt presents to the office today for chronic follow up. She is followed by Cardiologists every 3 months for CAD and CHF. She is currently on palliative care and has an aid that stays with her 6 hours Monday-Friday and has family staying the rest of the time. She has someone with her at all times.  Hypertension This is a chronic problem. The current episode started more than 1 year ago. The problem has been resolved since onset. The problem is controlled. Associated symptoms include anxiety, malaise/fatigue and shortness of breath (When walking ). Pertinent negatives include no peripheral edema. Risk factors for coronary artery disease include dyslipidemia, obesity and sedentary lifestyle. The current treatment provides moderate improvement. Hypertensive end-organ damage includes CAD/MI and heart failure. Identifiable causes of hypertension include a thyroid problem.  Anxiety Presents for follow-up visit. Symptoms include depressed mood, excessive worry, irritability, nervous/anxious behavior, restlessness and shortness of breath (When walking ). Symptoms occur occasionally. The severity of symptoms is moderate.    Depression        This is a chronic problem.  The current episode started more than 1 year ago.   The onset quality is gradual.   The problem occurs intermittently.  Associated symptoms include fatigue, irritable, restlessness and sad.  Associated symptoms include no hopelessness.  Past medical history includes thyroid problem and anxiety.   Thyroid Problem Presents for follow-up visit. Symptoms include anxiety, depressed mood and fatigue. Her past medical history is significant for heart failure.  Arthritis Presents for follow-up visit. She complains of pain and stiffness. The symptoms have been stable. Affected  locations include the left knee and right knee. Her pain is at a severity of 5/10. Associated symptoms include fatigue.   COPD Using the Symbicort BID, continues to have SOB.   Review of Systems  Constitutional: Positive for fatigue, irritability and malaise/fatigue.  Respiratory: Positive for shortness of breath (When walking ).   Musculoskeletal: Positive for arthritis and stiffness.  Psychiatric/Behavioral: Positive for depression. The patient is nervous/anxious.   All other systems reviewed and are negative.      Objective:   Physical Exam Vitals reviewed.  Constitutional:      General: She is irritable. She is not in acute distress.    Appearance: She is well-developed. She is obese.  HENT:     Head: Normocephalic and atraumatic.     Right Ear: Tympanic membrane normal.     Left Ear: Tympanic membrane normal.  Eyes:     Pupils: Pupils are equal, round, and reactive to light.  Neck:     Thyroid: No thyromegaly.  Cardiovascular:     Rate and Rhythm: Normal rate and regular rhythm.     Heart sounds: Normal heart sounds. No murmur heard.   Pulmonary:     Effort: Pulmonary effort is normal. No respiratory distress.     Breath sounds: Normal breath sounds. No wheezing.  Abdominal:     General: Bowel sounds are normal. There is no distension.     Palpations: Abdomen is soft.     Tenderness: There is no abdominal tenderness.  Musculoskeletal:        General: No tenderness. Normal range of motion.     Cervical back: Normal range of motion and neck supple.  Skin:  General: Skin is warm and dry.  Neurological:     Mental Status: She is alert and oriented to person, place, and time.     Cranial Nerves: No cranial nerve deficit.     Deep Tendon Reflexes: Reflexes are normal and symmetric.  Psychiatric:        Behavior: Behavior normal.        Thought Content: Thought content normal.        Judgment: Judgment normal.       BP 130/83   Pulse (!) 112   Temp (!) 97.5  F (36.4 C)   Ht 4' 11" (1.499 m)   Wt 208 lb 12.8 oz (94.7 kg)   SpO2 92%   BMI 42.17 kg/m      Assessment & Plan:  ADRIANNA DUDAS comes in today with chief complaint of Medical Management of Chronic Issues   Diagnosis and orders addressed:  1. Essential hypertension, benign - CBC with Differential/Platelet - CMP14+EGFR  2. Chronic obstructive pulmonary disease with acute exacerbation (HCC) - CBC with Differential/Platelet - CMP14+EGFR  3. Hypothyroidism, unspecified type - CBC with Differential/Platelet - CMP14+EGFR  4. Controlled substance agreement signed - ALPRAZolam (XANAX) 0.5 MG tablet; TAKE (1) TABLET TWICE A DAY.  Dispense: 60 tablet; Refill: 5 - CBC with Differential/Platelet - CMP14+EGFR  5. Chronic bilateral low back pain, unspecified whether sciatica present - CBC with Differential/Platelet - CMP14+EGFR  6. Benzodiazepine dependence (HCC) - ALPRAZolam (XANAX) 0.5 MG tablet; TAKE (1) TABLET TWICE A DAY.  Dispense: 60 tablet; Refill: 5 - CBC with Differential/Platelet - CMP14+EGFR  7. Shortness of breath - CBC with Differential/Platelet - CMP14+EGFR  8. Pain medication agreement signed - CBC with Differential/Platelet - CMP14+EGFR  9. Morbid obesity ([place]) - CBC with Differential/Platelet - CMP14+EGFR  10. Mixed hyperlipidemia - CBC with Differential/Platelet - CMP14+EGFR  11. GAD (generalized anxiety disorder) - ALPRAZolam (XANAX) 0.5 MG tablet; TAKE (1) TABLET TWICE A DAY.  Dispense: 60 tablet; Refill: 5 - CBC with Differential/Platelet - CMP14+EGFR  12. Moderate episode of recurrent major depressive disorder (HCC) - CBC with Differential/Platelet - CMP14+EGFR  13. Gastroesophageal reflux disease, unspecified whether esophagitis present - pantoprazole (PROTONIX) 20 MG tablet; Take 1 tablet (20 mg total) by mouth at bedtime.  Dispense: 90 tablet; Refill: 2 - CBC with Differential/Platelet - CMP14+EGFR  14. History of UTI - Urine  Culture - CBC with Differential/Platelet - CMP14+EGFR  15. Need for hepatitis C screening test - CBC with Differential/Platelet - CMP14+EGFR - Hepatitis C antibody   Labs pending Health Maintenance reviewed Diet and exercise encouraged  Follow up plan: 3 months    [name], FNP

## 2019-11-24 ENCOUNTER — Telehealth (HOSPITAL_COMMUNITY): Payer: Self-pay | Admitting: *Deleted

## 2019-11-24 ENCOUNTER — Other Ambulatory Visit: Payer: Self-pay | Admitting: Family

## 2019-11-24 LAB — CBC WITH DIFFERENTIAL/PLATELET
Basophils Absolute: 0.1 10*3/uL (ref 0.0–0.2)
Basos: 1 %
EOS (ABSOLUTE): 0.4 10*3/uL (ref 0.0–0.4)
Eos: 3 %
Hematocrit: 44.5 % (ref 34.0–46.6)
Hemoglobin: 14.4 g/dL (ref 11.1–15.9)
Immature Grans (Abs): 0.1 10*3/uL (ref 0.0–0.1)
Immature Granulocytes: 1 %
Lymphocytes Absolute: 5.4 10*3/uL — ABNORMAL HIGH (ref 0.7–3.1)
Lymphs: 35 %
MCH: 25.6 pg — ABNORMAL LOW (ref 26.6–33.0)
MCHC: 32.4 g/dL (ref 31.5–35.7)
MCV: 79 fL (ref 79–97)
Monocytes Absolute: 1.2 10*3/uL — ABNORMAL HIGH (ref 0.1–0.9)
Monocytes: 7 %
Neutrophils Absolute: 8.5 10*3/uL — ABNORMAL HIGH (ref 1.4–7.0)
Neutrophils: 53 %
Platelets: 431 10*3/uL (ref 150–450)
RBC: 5.63 x10E6/uL — ABNORMAL HIGH (ref 3.77–5.28)
RDW: 18 % — ABNORMAL HIGH (ref 11.7–15.4)
WBC: 15.7 10*3/uL — ABNORMAL HIGH (ref 3.4–10.8)

## 2019-11-24 LAB — CMP14+EGFR
ALT: 60 IU/L — ABNORMAL HIGH (ref 0–32)
AST: 72 IU/L — ABNORMAL HIGH (ref 0–40)
Albumin/Globulin Ratio: 1.5 (ref 1.2–2.2)
Albumin: 4.7 g/dL (ref 3.7–4.7)
Alkaline Phosphatase: 69 IU/L (ref 44–121)
BUN/Creatinine Ratio: 10 — ABNORMAL LOW (ref 12–28)
BUN: 12 mg/dL (ref 8–27)
Bilirubin Total: 0.7 mg/dL (ref 0.0–1.2)
CO2: 19 mmol/L — ABNORMAL LOW (ref 20–29)
Calcium: 11.2 mg/dL — ABNORMAL HIGH (ref 8.7–10.3)
Chloride: 98 mmol/L (ref 96–106)
Creatinine, Ser: 1.24 mg/dL — ABNORMAL HIGH (ref 0.57–1.00)
GFR calc Af Amer: 48 mL/min/{1.73_m2} — ABNORMAL LOW (ref >59)
GFR calc non Af Amer: 42 mL/min/{1.73_m2} — ABNORMAL LOW (ref >59)
Globulin, Total: 3.1 g/dL (ref 1.5–4.5)
Glucose: 158 mg/dL — ABNORMAL HIGH (ref 65–99)
Potassium: 5.2 mmol/L (ref 3.5–5.2)
Sodium: 137 mmol/L (ref 134–144)
Total Protein: 7.8 g/dL (ref 6.0–8.5)

## 2019-11-24 LAB — HEPATITIS C ANTIBODY: Hep C Virus Ab: <0.1 s/co ratio (ref 0.0–0.9)

## 2019-11-24 MED ORDER — DOXYCYCLINE HYCLATE 100 MG PO TABS: 100.0000 mg | ORAL_TABLET | Freq: Two times a day (BID) | ORAL | 0 refills | Status: DC

## 2019-11-24 NOTE — Telephone Encounter (Signed)
Pts daughter called to confirm if pt is supposed to take Digoxin. Per Dr.Bensimhon's note on 10/26/19 pt was supposed to discontinue medication. Pts daughter Sheila [name] aware.

## 2019-11-25 LAB — URINE CULTURE

## 2019-12-01 ENCOUNTER — Other Ambulatory Visit: Payer: Self-pay | Admitting: Family Medicine

## 2019-12-05 ENCOUNTER — Ambulatory Visit (INDEPENDENT_AMBULATORY_CARE_PROVIDER_SITE_OTHER): Payer: Medicare Other | Admitting: Licensed Clinical Social Worker

## 2019-12-05 DIAGNOSIS — M19011 Primary osteoarthritis, right shoulder: Secondary | ICD-10-CM | POA: Diagnosis not present

## 2019-12-05 DIAGNOSIS — E782 Mixed hyperlipidemia: Secondary | ICD-10-CM | POA: Diagnosis not present

## 2019-12-05 DIAGNOSIS — I10 Essential (primary) hypertension: Secondary | ICD-10-CM | POA: Diagnosis not present

## 2019-12-05 DIAGNOSIS — J441 Chronic obstructive pulmonary disease with (acute) exacerbation: Secondary | ICD-10-CM

## 2019-12-05 DIAGNOSIS — M170 Bilateral primary osteoarthritis of knee: Secondary | ICD-10-CM

## 2019-12-05 DIAGNOSIS — F411 Generalized anxiety disorder: Secondary | ICD-10-CM

## 2019-12-05 NOTE — Chronic Care Management (AMB) (Addendum)
Chronic Care [name]    Clinical Social Work Follow Up Note  12/05/2019 Name: Sheila [name] MRN: [id number] DOB: 09-Jul-1940  Sheila [name] is a 79 y.o. year old female who is a primary care patient of Sheila Balloon, FNP. The CCM team was consulted for assistance with [employer] .   Review of patient status, including review of consultants reports, other relevant assessments, and collaboration with appropriate care team members and the patient's provider was performed as part of comprehensive patient evaluation and provision of chronic care [name] services.    SDOH (Social Determinants of Health) assessments performed: No;risk for social isolation; risk for tobacco use; risk for depression; risk for physical inactivity    Office Visit from 11/23/2019 in [place]  PHQ-9 Total Score 9      GAD 7 : Generalized Anxiety Score 11/23/2019 07/26/2019 02/22/2019 05/05/2018  Nervous, Anxious, on Edge 1 1 1 1   Control/stop worrying 1 0 1 0  Worry too much - different things 0 0 1 0  Trouble relaxing 0 0 1 0  Restless 0 0 1 0  Easily annoyed or irritable 2 0 0 0  Afraid - awful might happen 0 0 1 0  Total GAD 7 Score 4 1 6 1   Anxiety Difficulty - Not difficult at all Somewhat difficult Not difficult at all     Outpatient Encounter Medications as of 12/05/2019  Medication Sig Note   albuterol (PROVENTIL) (2.5 MG/3ML) 0.083% nebulizer solution NEBULIZE 1 VIAL EVERY 6-8 HOURS AS NEEDED FOR WHEEZING OR SHORTNESS OF BREATH    ALPRAZolam (XANAX) 0.5 MG tablet TAKE (1) TABLET TWICE A DAY.    aspirin 81 MG chewable tablet Chew 1 tablet (81 mg total) by mouth daily.    budesonide-formoterol (SYMBICORT) 160-4.5 MCG/ACT inhaler Inhale 2 puffs into the lungs 2 (two) times daily.    cetirizine (ZYRTEC) 10 MG tablet Take 1 tablet (10 mg total) by mouth daily. 10/02/2019: Pt is only taking xyzal - not zyrtec   Cholecalciferol (VITAMIN D3) 25 MCG (1000 UT) CAPS Take 1,000  Units by mouth daily.     diclofenac sodium (VOLTAREN) 1 % GEL Apply 2 g topically 4 (four) times daily. (Patient taking differently: Apply 2 g topically 4 (four) times daily as needed (pain). )    doxycycline (VIBRA-TABS) 100 MG tablet Take 1 tablet (100 mg total) by mouth 2 (two) times daily.    DULoxetine (CYMBALTA) 60 MG capsule TAKE 1 CAPUSLE ONCE DAILY (Patient taking differently: Take 60 mg by mouth daily. )    fluticasone (FLONASE) 50 MCG/ACT nasal spray Place 2 sprays into both nostrils daily. (Patient taking differently: Place 2 sprays into both nostrils daily as needed for allergies or rhinitis. )    gabapentin (NEURONTIN) 300 MG capsule TAKE (1) CAPSULE THREE TIMES DAILY. (Patient taking differently: Take 300 mg by mouth See admin instructions. Take one capsule (300 mg) by mouth three times daily - morning, 5pm and bedtime)    levothyroxine (SYNTHROID) 100 MCG tablet Take 1 tablet (100 mcg total) by mouth daily.    meclizine (ANTIVERT) 25 MG tablet TAKE (1) TABLET THREE TIMES DAILY AS NEEDED FOR DIZZINESS. (Patient taking differently: Take 25 mg by mouth 3 (three) times daily as needed for dizziness. )    Multiple Vitamin (MULTIVITAMIN WITH MINERALS) TABS tablet Take 1 tablet by mouth daily.    Omega-3 Fatty Acids (FISH OIL OMEGA-3 PO) Take 1 capsule by mouth every evening.  pantoprazole (PROTONIX) 20 MG tablet Take 1 tablet (20 mg total) by mouth at bedtime.    Pitavastatin Calcium (LIVALO) 4 MG TABS Take 1 tablet (4 mg total) by mouth daily.    potassium chloride SA (KLOR-CON) 20 MEQ tablet TAKE (1) TABLET TWICE A DAY.    sacubitril-valsartan (ENTRESTO) 49-51 MG Take 1 tablet by mouth 2 (two) times daily.    spironolactone (ALDACTONE) 25 MG tablet Take 1 tablet (25 mg total) by mouth daily.    Tetrahydrozoline HCl (VISINE OP) Place 1 drop into both eyes daily as needed (dry eyes/itching).    torsemide (DEMADEX) 20 MG tablet Take 2 tablets (40 mg total) by mouth daily.    traMADol  (ULTRAM) 50 MG tablet Take 50-100 mg by mouth every 12 (twelve) hours as needed.    triamcinolone ointment (KENALOG) 0.5 % Apply 1 application topically 2 (two) times daily. (Patient taking differently: Apply 1 application topically 2 (two) times daily as needed (itching). )    No facility-administered encounter medications on file as of 12/05/2019.    Goals       Client will talk with Sheila [name] about social work needs of client (pt-stated)      Current Barriers:  Ambulation challenges COPD Breathing challenges in client with Chronic Diagnoses of HTN, DJD, HLD, GAD, Osteoarthritis,and COPD  Clinical Social Work Clinical Goal(s):  Sheila [name] will talk with client in next 30 days to discuss the social work needs of client at that time  Interventions:  Talked with Sheila [name] ,niece of client, about client needs Talked with Sheila [name] about client receiving Palliative Care support Talked with Sheila [name] about [place] support for client Talked with c Talked with Sheila [name] about ambulation needs of client (uses walker to ambulate) Talked with Sheila [name] about client breathing challenges and her use of nebulizer machine Talked with Sheila [name] about pain issues of client Talked with Sheila [name] about upcoming client appointments Talked with Sheila [name] about client recent appointment with cardiologist Talked with Sheila [name] about Palliative Care support for client and help client receives through Sheila [name].  Talked with Sheila [name] about mood of client Talked with Sheila [name] about confusion of client  Talked with Sheila [name] about client completion of ADLs Talked with Sheila [name] about family support of client   Patient Self Care Activities:  Self administers medications as prescribed Attends all scheduled provider appointments  Patient Self Care Deficits Unable to perform IADLs independently  Initial goal documentation     Follow Up Plan: Sheila [name] to call client/Sheila [name]/Sheila [name] in next 4 weeks to talk with client or  Sheila [name] or Sheila [name] about the social work needs of client at that time  Sheila [name].Sheila [name] [name], Sheila [name] Licensed Clinical Social Worker Sheila [name] [phone]  I have reviewed the CCM documentation and agree with the written assessment and plan of care.  Sheila Dun, FNP

## 2019-12-05 NOTE — Patient Instructions (Addendum)
Licensed Clinical Social Worker Visit Information  Goals we discussed today:   .  Client will talk with LCSW about social work needs of client (pt-stated)        Current Barriers:   Ambulation challenges  COPD  Breathing challenges in client with Chronic Diagnoses of HTN, DJD, HLD, GAD, Osteoarthritis,and COPD  Clinical Social Work Clinical Goal(s):   LCSW will talk with client in next 30 days to discuss the social work needs of client at that time  Interventions:   Talked with [name] ,niece of client, about client needs  Talked with [name] about client receiving Palliative Care support  Talked with [name] about [place] support for client  Talked with c  Talked with [name] about ambulation needs of client (uses walker to ambulate)  Talked with [name] about client breathing challenges and her use of nebulizer machine  Talked with [name] about pain issues of client  Talked with [name] about upcoming client appointments  Talked with [name] about client recent appointment with cardiologist  Talked with [name] about Palliative Care support for client and help client receives through [place].   Talked with [name] about mood of client  Talked with [name] about confusion of client   Talked with [name] about client completion of ADLs  Talked with [name] about family support of client   Patient Self Care Activities:   Self administers medications as prescribed  Attends all scheduled provider appointments   Patient Self Care Deficits  Unable to perform IADLs independently  Initial goal documentation     Follow Up Plan: LCSW to call client/[name]/[name] next 4 weeks to talk with clientor [name] or Suan Halter the social work needs of client at that time  Materials Provided: No  The patient/Denise [place], niece of patient verbalized understanding of instructions provided today and declined a print copy of  patient Paediatric nurse.   Norva Riffle.Kaiyah Eber MSW, LCSW Licensed Clinical Social Worker [facility] [phone]

## 2019-12-08 ENCOUNTER — Other Ambulatory Visit: Payer: Self-pay

## 2019-12-08 ENCOUNTER — Other Ambulatory Visit: Payer: Medicare Other | Admitting: Nurse Practitioner

## 2019-12-08 DIAGNOSIS — J440 Chronic obstructive pulmonary disease with acute lower respiratory infection: Secondary | ICD-10-CM

## 2019-12-08 DIAGNOSIS — J209 Acute bronchitis, unspecified: Secondary | ICD-10-CM

## 2019-12-08 DIAGNOSIS — Z515 Encounter for palliative care: Secondary | ICD-10-CM

## 2019-12-08 NOTE — Progress Notes (Addendum)
Sheila [name] Consult Note Telephone: [phone]  Fax: [phone]  PATIENT NAME: Sheila [name] [address] [place] [address] [phone] (home)  DOB: December 16, 1940 MRN: [id number]  PRIMARY CARE PROVIDER:    Sharion Balloon, FNP,  [address] [place] [address] [phone]  REFERRING PROVIDER:   Sharion Balloon, [place],  [place] [address] [phone]  RESPONSIBLE PARTY:   Extended Emergency Contact Information Primary Emergency Contact: [place], [place] Phone: [phone] Mobile Phone: [phone] Relation: Niece Secondary Emergency Contact: Josue H[place] Phone: [phone] Mobile Phone: [phone] Relation: Daughter  I met face to face with patient and family in home.  ASSESSMENT AND RECOMMENDATIONS:   1. Advance Care Planning/Goals of Care:  Goal of care: Goals of care is comfort and function. Patient expressed desire to die in home with her family around her. Family desire for patient to be comfortable as she lives out the rest of her time, desire for her to have some function and ability to go out in the community occasionally. Directives: Patient elected to change her code status from Full Code to DNR today, saying she does not want to suffer or be a burden to her children. DNR form was signed and given to patient and her family to keep at a readily accessible area in home. A new MOST form was completed with patient and her family to reflect her current wishes. Details of the new MOST form includes; DNR, Limited additional interventions, determine use or limitation of antibiotics when infection occurs, IV fluids if indicated, feeding tube long term if indicated.  2. Symptom Management: Patient with COPD and HF (Ef 20-25%). Patient with ongoing activity intolerance, reports dyspnea with mild activity. Family report improvement in symptoms in the  last week, saying patient has been stable with no worsening of symptoms. Patient is currently on Doxycycline for UTI. Family report no side effect from the antibiotics. Patient appeared more awake and communicative during visit today, no evidence of fluid over load, edema to lower extremity resolved. She report feeling better, she however complained of headache and was given Tramadol 50mg  by her family. Family report patient takes Tramadol occassionally for musculoskeletal pain. Patient report ongoing problem with insomnia,I recommended Melatonin 3mg  at last visit.  Family has been administering Melatonin at bedtime, patient lays down to sleep at 10pm with TV on. Reviewed and discussed examples of good bed time routines with family to include turning TV off during bedtime, avoidance of caffienated drink close to bedtime, and going to bed about the same time daily. Recommend patient take the Melatonin in the evenings with dinner instead of at 10pm, this will allow for better results. Family asked if patient can be prescribed a manual wheel chair, saying they are not able to take patient into the community due to inability to walk or stand for long periods of time. I will reach out to patient's PCP for possibility of prescription for a manual wheelchair.  3. Follow up Palliative Care Visit: Palliative care will continue to follow for goals of care clarification and symptom management. Return in about 4 weeks or prn.  4. Family /Caregiver/Community Supports: Patient lives at home in her own home. Patient has very supportive family, has 2 daughters who assist in providing care. Patient's niece Sheila [name] who is a CNA, is one of the paid caregivers for patient during the day.  5. Cognitive / Functional decline:  Patient is dependent on family/caregiver for her ADLs, she is however continent of bowel and bladder, does not wear incontinent pads. Family report occasional incontinence episodes related to urgency, patient  also reported to love to drink tomato juice. She is alert and oriented, able to make her own decisions. Ambulates short distances with a Rolator walker, but tires easily. No report of fall or Hospital visit in the last one month.  I spent 60 minutes providing this consultation, 11am to 12pm. More than 50% of the time in this consultation was spent coordinating communication.   HISTORY OF PRESENT ILLNESS:  MELLANY DINSMORE is a 79 y.o. year old female with multiple medical problems including COPD, anxiety (on xanax), Chronic pain, DJD. Patient was hospitalized 10/24/2019 for UTI and SOB, currently on antibiotics for recurrent UTI. Palliative Care was asked to follow this patient by consultation request of Sharion Balloon, FNP to help address advance care planning and goals of care. This is a follow up visit from 11/07/2019.  CODE STATUS: DNR  PPS: 50%  HOSPICE ELIGIBILITY/DIAGNOSIS: TBD  PAST MEDICAL HISTORY:  Past Medical History:  Diagnosis Date  . Allergy   . Bell's palsy   . Cancer ([place])    skin CA on face  . Cataract   . Chest pain 07/2009   Myoveiw stress test negative.  . Depression   . Diverticulosis of colon   . DJD (degenerative joint disease) of knee   . Dyspnea    on exertion  . Family history of adverse reaction to anesthesia    sister ha nausea/vomiting  . Gastric ulcer with hemorrhage 01/14/2011  . GERD (gastroesophageal reflux disease)   . Headache   . History of fractured pelvis   . Hypercholesteremia   . Hypertension   . Hypothyroidism   . LV dysfunction   . UTI (lower urinary tract infection)     SOCIAL HX:  Social History   Tobacco Use  . Smoking status: Former Smoker    Types: Cigarettes    Quit date: 02/24/1968    Years since quitting: 51.8  . Smokeless tobacco: Current User    Types: Snuff  . Tobacco comment: quit yrs and yrs ago when she was very young  Substance Use Topics  . Alcohol use: No   FAMILY HX:  Family History  Problem Relation Age of  Onset  . Aneurysm Mother   . Stroke Mother   . Hypertension Mother   . Cancer Father        lung  . Heart disease Sister   . Hip fracture Sister   . Cancer Brother   . Alzheimer's disease Sister   . Cancer Sister        skin, uterine  . Cancer Brother     ALLERGIES:  Allergies  Allergen Reactions  . Ibuprofen Other (See Comments)    History of bleeding ulcers - contra-indicated   . Benadryl [Diphenhydramine Hcl] Other (See Comments)    Worsening depression   . Lipitor [Atorvastatin] Other (See Comments)    Body aches.  . Hydrocodone Nausea And Vomiting  . Codeine Nausea And Vomiting  . Morphine And Related Nausea And Vomiting  . Tdap [Tetanus-Diphth-Acell Pertussis] Swelling    Redness and localized swelling at injection site      PERTINENT MEDICATIONS:  Outpatient Encounter Medications as of 12/08/2019  Medication Sig  . albuterol (PROVENTIL) (2.5 MG/3ML) 0.083% nebulizer solution NEBULIZE 1 VIAL EVERY 6-8 HOURS AS NEEDED FOR WHEEZING OR SHORTNESS OF  BREATH  . ALPRAZolam (XANAX) 0.5 MG tablet TAKE (1) TABLET TWICE A DAY.  [name] aspirin 81 MG chewable tablet Chew 1 tablet (81 mg total) by mouth daily.  . budesonide-formoterol (SYMBICORT) 160-4.5 MCG/ACT inhaler Inhale 2 puffs into the lungs 2 (two) times daily.  . cetirizine (ZYRTEC) 10 MG tablet Take 1 tablet (10 mg total) by mouth daily.  . Cholecalciferol (VITAMIN D3) 25 MCG (1000 UT) CAPS Take 1,000 Units by mouth daily.   . diclofenac sodium (VOLTAREN) 1 % GEL Apply 2 g topically 4 (four) times daily. (Patient taking differently: Apply 2 g topically 4 (four) times daily as needed (pain). )  . doxycycline (VIBRA-TABS) 100 MG tablet Take 1 tablet (100 mg total) by mouth 2 (two) times daily.  . DULoxetine (CYMBALTA) 60 MG capsule TAKE 1 CAPUSLE ONCE DAILY (Patient taking differently: Take 60 mg by mouth daily. )  . fluticasone (FLONASE) 50 MCG/ACT nasal spray Place 2 sprays into both nostrils daily. (Patient taking  differently: Place 2 sprays into both nostrils daily as needed for allergies or rhinitis. )  . gabapentin (NEURONTIN) 300 MG capsule TAKE (1) CAPSULE THREE TIMES DAILY. (Patient taking differently: Take 300 mg by mouth See admin instructions. Take one capsule (300 mg) by mouth three times daily - morning, 5pm and bedtime)  . levothyroxine (SYNTHROID) 100 MCG tablet Take 1 tablet (100 mcg total) by mouth daily.  . meclizine (ANTIVERT) 25 MG tablet TAKE (1) TABLET THREE TIMES DAILY AS NEEDED FOR DIZZINESS. (Patient taking differently: Take 25 mg by mouth 3 (three) times daily as needed for dizziness. )  . Multiple Vitamin (MULTIVITAMIN WITH MINERALS) TABS tablet Take 1 tablet by mouth daily.  . Omega-3 Fatty Acids (FISH OIL OMEGA-3 PO) Take 1 capsule by mouth every evening.   . pantoprazole (PROTONIX) 20 MG tablet Take 1 tablet (20 mg total) by mouth at bedtime.  . Pitavastatin Calcium (LIVALO) 4 MG TABS Take 1 tablet (4 mg total) by mouth daily.  . potassium chloride SA (KLOR-CON) 20 MEQ tablet TAKE (1) TABLET TWICE A DAY.  . sacubitril-valsartan (ENTRESTO) 49-51 MG Take 1 tablet by mouth 2 (two) times daily.  [name] spironolactone (ALDACTONE) 25 MG tablet Take 1 tablet (25 mg total) by mouth daily.  . Tetrahydrozoline HCl (VISINE OP) Place 1 drop into both eyes daily as needed (dry eyes/itching).  . torsemide (DEMADEX) 20 MG tablet Take 2 tablets (40 mg total) by mouth daily.  . traMADol (ULTRAM) 50 MG tablet Take 50-100 mg by mouth every 12 (twelve) hours as needed.  . triamcinolone ointment (KENALOG) 0.5 % Apply 1 application topically 2 (two) times daily. (Patient taking differently: Apply 1 application topically 2 (two) times daily as needed (itching). )   No facility-administered encounter medications on file as of 12/08/2019.    PHYSICAL EXAM / ROS  Current and past weights: 208lbs down from 210lbs at last visit a month ago. Ht 28f 11". BMI 42kg/m2 General:  Morbidly obese, well appearing,  cooperative and coherent. Sitting in a recliner in no acute distress. Cardiovascular: denied chest pain, trace edema  Pulmonary: no cough, no increased SOB, room air, oxygen saturation 96% Abdomen: appetite fair, denied constipation, continent of bowel GU: denies dysuria, continent of urine MSK:  no joint and ROM abnormalities, ambulatory Skin: no rashes or wounds reported or noted on exposed skin Neurological: weakness, but otherwise nonfocal  Jari Favre, DNP, AGPCNP-BC

## 2019-12-18 ENCOUNTER — Telehealth: Payer: Self-pay | Admitting: Family Medicine

## 2019-12-18 DIAGNOSIS — Z780 Asymptomatic menopausal state: Secondary | ICD-10-CM

## 2019-12-18 NOTE — Telephone Encounter (Signed)
Patient has not had prolia on med list

## 2019-12-20 ENCOUNTER — Other Ambulatory Visit: Payer: Self-pay | Admitting: Family

## 2019-12-20 ENCOUNTER — Other Ambulatory Visit: Payer: Self-pay | Admitting: Nurse Practitioner

## 2019-12-20 DIAGNOSIS — G629 Polyneuropathy, unspecified: Secondary | ICD-10-CM

## 2019-12-20 DIAGNOSIS — F411 Generalized anxiety disorder: Secondary | ICD-10-CM

## 2019-12-20 DIAGNOSIS — J449 Chronic obstructive pulmonary disease, unspecified: Secondary | ICD-10-CM

## 2019-12-20 DIAGNOSIS — F331 Major depressive disorder, recurrent, moderate: Secondary | ICD-10-CM

## 2019-12-28 ENCOUNTER — Ambulatory Visit (INDEPENDENT_AMBULATORY_CARE_PROVIDER_SITE_OTHER): Payer: Medicare Other | Admitting: *Deleted

## 2019-12-28 VITALS — BP 130/80 | Wt 208.0 lb

## 2019-12-28 DIAGNOSIS — Z0000 Encounter for general adult medical examination without abnormal findings: Secondary | ICD-10-CM

## 2019-12-28 NOTE — Progress Notes (Addendum)
MEDICARE ANNUAL WELLNESS VISIT  12/28/2019  Telephone Visit Disclaimer This Medicare AWV was conducted by telephone due to national recommendations for restrictions regarding the COVID-19 Pandemic (e.g. social distancing).  I verified, using two identifiers, that I am speaking with Sheila [name] or their authorized healthcare agent. I discussed the limitations, risks, security, and privacy concerns of performing an evaluation and management service by telephone and the potential availability of an in-person appointment in the future. The patient expressed understanding and agreed to proceed.  Location of Patient: in her home  Location of Provider (nurse):  [facility]   Subjective:    Sheila [name] is a 79 y.o. female patient of Sheila [name], [place] who had a Medicare Annual Wellness Visit today via telephone. [name] is Retired and lives alone, but has around the clock caregiver. she has 2 children. she reports that she is socially active and does interact with friends/family regularly. she is not physically active and enjoys watching TV.  Patient Care Team: Sharion Balloon, FNP as PCP - General (Family Medicine) Rogene Houston, MD as Consulting Physician (Gastroenterology) Burnell Blanks, MD as Consulting Physician (Cardiology) Ilean China, RN as Registered Nurse Shea Evans, Norva Riffle, LCSW as Social Worker (Licensed Clinical Social Worker)  Advanced Directives 12/28/2019 10/24/2019 10/02/2019 12/27/2018 08/29/2018 03/29/2018 03/08/2018  Does Patient Have a Medical Advance Directive? No No No No No No No  Type of Advance Directive - - - - - - -  Does patient want to make changes to medical advance directive? - - - - - - -  Copy of [place] in Chart? - - - - - - -  Would patient like information on creating a medical advance directive? No - Patient declined - No - Patient declined No - Patient declined - - -  Pre-existing out of facility DNR order (yellow form or pink  MOST form) - - - - - - -    Hospital Utilization Over the Past 12 Months: # of hospitalizations or ER visits: 2 # of surgeries: 0  Review of Systems    Patient reports that her overall health is worse compared to last year.  General ROS: negative  Patient Reported Readings (BP, Pulse, CBG, Weight, etc) BP 130/80 Comment: home reading  Wt 208 lb (94.3 kg)   BMI 42.01 kg/m   Pain Assessment       Current Medications & Allergies (verified) Allergies as of 12/28/2019       Reactions   Ibuprofen Other (See Comments)   History of bleeding ulcers - contra-indicated   Benadryl [diphenhydramine Hcl] Other (See Comments)   Worsening depression   Lipitor [atorvastatin] Other (See Comments)   Body aches.   Hydrocodone Nausea And Vomiting   Codeine Nausea And Vomiting   Morphine And Related Nausea And Vomiting   Tdap [tetanus-diphth-acell Pertussis] Swelling   Redness and localized swelling at injection site        Medication List        Accurate as of December 28, 2019  1:49 PM. If you have any questions, ask your nurse or doctor.          STOP taking these medications    doxycycline 100 MG tablet Commonly known as: VIBRA-TABS       TAKE these medications    albuterol (2.5 MG/3ML) 0.083% nebulizer solution Commonly known as: PROVENTIL NEBULIZE 1 VIAL EVERY 6-8 HOURS AS NEEDED FOR WHEEZING OR SHORTNESS OF BREATH  ALPRAZolam 0.5 MG tablet Commonly known as: XANAX TAKE (1) TABLET TWICE A DAY.   aspirin 81 MG chewable tablet Chew 1 tablet (81 mg total) by mouth daily.   cetirizine 10 MG tablet Commonly known as: ZYRTEC Take 1 tablet (10 mg total) by mouth daily.   diclofenac sodium 1 % Gel Commonly known as: VOLTAREN Apply 2 g topically 4 (four) times daily. What changed:  when to take this reasons to take this   DULoxetine 60 MG capsule Commonly known as: CYMBALTA TAKE 1 CAPUSLE ONCE DAILY   FISH OIL OMEGA-3 PO Take 1 capsule by mouth every  evening.   fluticasone 50 MCG/ACT nasal spray Commonly known as: FLONASE Place 2 sprays into both nostrils daily. What changed:  when to take this reasons to take this   gabapentin 300 MG capsule Commonly known as: NEURONTIN TAKE (1) CAPSULE THREE TIMES DAILY.   levothyroxine 100 MCG tablet Commonly known as: Synthroid Take 1 tablet (100 mcg total) by mouth daily.   Livalo 4 MG Tabs Generic drug: Pitavastatin Calcium Take 1 tablet (4 mg total) by mouth daily.   meclizine 25 MG tablet Commonly known as: ANTIVERT TAKE (1) TABLET THREE TIMES DAILY AS NEEDED FOR DIZZINESS. What changed: See the new instructions.   multivitamin with minerals Tabs tablet Take 1 tablet by mouth daily.   pantoprazole 20 MG tablet Commonly known as: PROTONIX Take 1 tablet (20 mg total) by mouth at bedtime.   potassium chloride SA 20 MEQ tablet Commonly known as: KLOR-CON TAKE (1) TABLET TWICE A DAY.   sacubitril-valsartan 49-51 MG Commonly known as: ENTRESTO Take 1 tablet by mouth 2 (two) times daily.   spironolactone 25 MG tablet Commonly known as: ALDACTONE Take 1 tablet (25 mg total) by mouth daily.   Symbicort 160-4.5 MCG/ACT inhaler Generic drug: budesonide-formoterol INHALE 2 PUFFS TWICE A DAY   torsemide 20 MG tablet Commonly known as: DEMADEX Take 2 tablets (40 mg total) by mouth daily.   triamcinolone ointment 0.5 % Commonly known as: KENALOG Apply 1 application topically 2 (two) times daily. What changed:  when to take this reasons to take this   Ultram 50 MG tablet Generic drug: traMADol Take 50-100 mg by mouth every 12 (twelve) hours as needed.   VISINE OP Place 1 drop into both eyes daily as needed (dry eyes/itching).   Vitamin D3 25 MCG (1000 UT) Caps Take 1,000 Units by mouth daily.        History (reviewed): Past Medical History:  Diagnosis Date   Allergy    Bell's palsy    Cancer ([place])    skin CA on face   Cataract    Chest pain 07/2009    Myoveiw stress test negative.   Depression    Diverticulosis of colon    DJD (degenerative joint disease) of knee    Dyspnea    on exertion   Family history of adverse reaction to anesthesia    sister ha nausea/vomiting   Gastric ulcer with hemorrhage 01/14/2011   GERD (gastroesophageal reflux disease)    Headache    History of fractured pelvis    Hypercholesteremia    Hypertension    Hypothyroidism    LV dysfunction    UTI (lower urinary tract infection)    Past Surgical History:  Procedure Laterality Date   APPENDECTOMY     BREAST BIOPSY Left    ESOPHAGOGASTRODUODENOSCOPY  01/14/2011   Procedure: ESOPHAGOGASTRODUODENOSCOPY (EGD);  Surgeon: Rogene Houston, MD;  [facility]: [facility];  Service: Endoscopy;  Laterality: N/A;   HIP SURGERY     pelvis   JOINT REPLACEMENT     KNEE SURGERY  04/2010.   Total left knee replacement   LAPAROSCOPIC APPENDECTOMY N/A 08/01/2013   Procedure: APPENDECTOMY LAPAROSCOPIC;  Surgeon: Jamesetta So, MD;  Location: [facility];  Service: General;  Laterality: N/A;   RIGHT/LEFT HEART CATH AND CORONARY ANGIOGRAPHY N/A 10/02/2019   Procedure: RIGHT/LEFT HEART CATH AND CORONARY ANGIOGRAPHY;  Surgeon: Lorretta Harp, MD;  Location: [facility];  Service: Cardiovascular;  Laterality: N/A;   Family History  Problem Relation Age of Onset   Aneurysm Mother    Stroke Mother    Hypertension Mother    Cancer Father        lung   Heart disease Sister    Hip fracture Sister    Cancer Brother    Alzheimer's disease Sister    Cancer Sister        skin, uterine   Cancer Brother    Social History   Socioeconomic History   Marital status: Widowed    Spouse name: Not on file   Number of children: 2   Years of education: 0   Highest education level: Never attended school  Occupational History   Occupation: retired  Tobacco Use   Smoking status: Former Smoker    Types: Cigarettes    Quit date: 02/24/1968    Years since quitting: 51.8    Smokeless tobacco: Current User    Types: Snuff   Tobacco comment: quit yrs and yrs ago when she was very young  Media planner   Vaping Use: Never used  Substance and Sexual Activity   Alcohol use: No   Drug use: No   Sexual activity: Not Currently    Birth control/protection: Post-menopausal  Other Topics Concern   Not on file  Social History Narrative   Not on file   Social Determinants of Health   Financial Resource Strain:    Difficulty of Paying Living Expenses: Not on file  Food Insecurity:    Worried About [name] in the Last Year: Not on file   [place] in the Last Year: Not on file  Transportation Needs:    Lack of Transportation (Medical): Not on file   Lack of Transportation (Non-Medical): Not on file  Physical Activity:    Days of Exercise per Week: Not on file   Minutes of Exercise per Session: Not on file  Stress:    Feeling of Stress : Not on file  Social Connections:    Frequency of Communication with Friends and Family: Not on file   Frequency of Social Gatherings with Friends and Family: Not on file   Attends Religious Services: Not on file   Active Member of Clubs or Organizations: Not on file   Attends Archivist Meetings: Not on file   Marital Status: Not on file    Activities of Daily Living In your present state of health, do you have any difficulty performing the following activities: 12/28/2019 10/05/2019  Hearing? Y -  Comment has hearing aid -  Vision? Y -  Comment rx glasses -  Difficulty concentrating or making decisions? N -  Walking or climbing stairs? Y -  Dressing or bathing? Y -  Comment needs help -  Doing errands, shopping? Y Y  Comment someone takes her -  Conservation officer, nature and eating ? Y -  Using  the Toilet? N -  In the past six months, have you accidently leaked urine? N -  Do you have problems with loss of bowel control? N -  Managing your Medications? Y -  Managing your Finances? Y -  Comment  daughter -  Housekeeping or managing your Housekeeping? Y -  Some recent data might be hidden    Patient Education/ Literacy    Exercise Current Exercise Habits: The patient does not participate in regular exercise at present, Exercise limited by: None identified  Diet Patient reports consuming 2 meals a day and 1 snack(s) a day Patient reports that her primary diet is: Regular Patient reports that she does have regular access to food.   Depression Screen PHQ 2/9 Scores 12/28/2019 11/23/2019 07/26/2019 02/22/2019 12/27/2018 10/13/2018 05/05/2018  PHQ - 2 Score 0 3 2 2  0 0 4  PHQ- 9 Score - 9 6 7  - - 14     Fall Risk Fall Risk  12/28/2019 11/23/2019 11/23/2019 07/26/2019 12/27/2018  Falls in the past year? 1 1 0 0 0  Number falls in past yr: 0 1 - - -  Injury with Fall? 1 0 - - -  Comment - - - - -  Risk for fall due to : History of fall(s) History of fall(s);Impaired balance/gait - - -  Follow up Falls evaluation completed Falls evaluation completed - - -     Objective:  Sheila [name] seemed alert and oriented and she participated appropriately during our telephone visit.  Blood Pressure Weight BMI  BP Readings from Last 3 Encounters:  12/28/19 130/80  11/23/19 130/83  11/06/19 124/70   Wt Readings from Last 3 Encounters:  12/28/19 208 lb (94.3 kg)  11/23/19 208 lb 12.8 oz (94.7 kg)  10/26/19 210 lb (95.3 kg)   BMI Readings from Last 1 Encounters:  12/28/19 42.01 kg/m    *Unable to obtain current vital signs, weight, and BMI due to telephone visit type  Hearing/Vision  Alannis did not seem to have difficulty with hearing/understanding during the telephone conversation Reports that she has not had a formal eye exam by an eye care professional within the past year Reports that she has not had a formal hearing evaluation within the past year *Unable to fully assess hearing and vision during telephone visit type  Cognitive Function: 6CIT Screen 12/28/2019 12/27/2018 12/22/2017  12/22/2017  What Year? 0 points 0 points 4 points 0 points  What month? 3 points 3 points 3 points 0 points  What time? 0 points 0 points - 0 points  Count back from 20 4 points 4 points 4 points 0 points  Months in reverse 4 points 4 points 4 points 0 points  Repeat phrase 6 points 8 points - 2 points  Total Score 17 19 - 2   (Normal:0-7, Significant for Dysfunction: >8)  Normal Cognitive Function Screening: No, total was 17 today    Immunization & Health Maintenance Record Immunization History  Administered Date(s) Administered   Influenza, High Dose Seasonal PF 02/10/2016, 12/15/2016, 12/09/2017   Influenza,inj,Quad PF,6+ Mos 11/15/2012, 12/11/2013, 01/07/2015, 01/05/2019   Pneumococcal Conjugate-13 07/06/2014   Pneumococcal Polysaccharide-23 12/12/2007, 12/15/2016   Tdap 02/23/2006    Health Maintenance  Topic Date Due   COVID-19 Vaccine (1) 01/13/2020 (Originally 07/11/1952)   INFLUENZA VACCINE  05/23/2020 (Originally 09/24/2019)   DEXA SCAN  12/27/2020 (Originally 12/23/2019)   MAMMOGRAM  01/05/2020   Hepatitis C Screening  Completed   PNA vac Low Risk Adult  Completed       Assessment  This is a routine wellness examination for [employer].  Health Maintenance: Due or Overdue There are no preventive care reminders to display for this patient.  Sheila [name] does not need a referral for Community Assistance: Care Management:   no Social Work:    no Prescription Assistance:  no Nutrition/Diabetes Education:  no   Plan:  Personalized Goals Goals Addressed             This Visit's Progress    DIET - INCREASE WATER INTAKE   On track    Try to drink 6-8 glasses of water daily       Personalized Health Maintenance & Screening Recommendations  up to date/ declined covid vaccine   Lung Cancer Screening Recommended: no (Low Dose CT Chest recommended if Age 47-80 years, 30 pack-year currently smoking OR have quit w/in past 15 years) Hepatitis C Screening  recommended: no HIV Screening recommended: no  Advanced Directives: Written information was not prepared per patient's request.  Referrals & Orders No orders of the defined types were placed in this encounter.  Follow-up Plan Follow-up with Sharion Balloon, FNP as planned    I have personally reviewed and noted the following in the patient's chart:   Medical and social history Use of alcohol, tobacco or illicit drugs  Current medications and supplements Functional ability and status Nutritional status Physical activity Advanced directives List of other physicians Hospitalizations, surgeries, and ER visits in previous 12 months Vitals Screenings to include cognitive, depression, and falls Referrals and appointments  In addition, I have reviewed and discussed with Sheila [name] certain preventive protocols, quality metrics, and best practice recommendations. A written personalized care plan for preventive services as well as general preventive health recommendations is available and can be mailed to the patient at her request.      Ronne Savoia, Cameron Proud  12/28/2019    I have reviewed and agree with the above AWV documentation.   [name], FNP

## 2019-12-28 NOTE — Telephone Encounter (Signed)
Dexa scan ordered. She needs this repeated.

## 2019-12-28 NOTE — Addendum Note (Signed)
Addended by: [name] A on: 12/28/2019 04:24 PM   Modules accepted: Orders

## 2019-12-28 NOTE — Telephone Encounter (Signed)
I spoke with pt today- 12/28/19 - during AWV  She thinks she is supposed to be getting Prolia ( says she has had some in past )   Wants to continue these if possible.    Charts says d/c in 10/2019.   Was given in [place] and July 2020.   Please let pt know either way.

## 2019-12-29 ENCOUNTER — Telehealth: Payer: Self-pay | Admitting: Family Medicine

## 2020-01-03 ENCOUNTER — Telehealth: Payer: Self-pay

## 2020-01-03 ENCOUNTER — Other Ambulatory Visit: Payer: Medicare Other | Admitting: Nurse Practitioner

## 2020-01-03 ENCOUNTER — Other Ambulatory Visit: Payer: Self-pay

## 2020-01-03 DIAGNOSIS — Z515 Encounter for palliative care: Secondary | ICD-10-CM

## 2020-01-03 DIAGNOSIS — G4700 Insomnia, unspecified: Secondary | ICD-10-CM | POA: Diagnosis not present

## 2020-01-03 NOTE — Progress Notes (Signed)
[facility] Consult Note Telephone: [phone]  Fax: [phone]  PATIENT NAME: Sheila [name] [address] [place] [address] [phone] (home)  DOB: Jul 07, 1940 MRN: [id number]  PRIMARY CARE PROVIDER:    Sharion Balloon, FNP,  [address] [place] [address] [phone]  REFERRING PROVIDER:   Sharion [name], [place],  [place] [address] [phone]  RESPONSIBLE PARTY:   Extended Emergency Contact Information Primary Emergency Contact: Sheila [name], Sheila [name] Phone: [phone] Mobile Phone: [phone] Relation: Niece Secondary Emergency Contact: Sheila [name] [place] Phone: [phone] Mobile Phone: [phone] Relation: Daughter  I met face to face with patient and family in home.  ASSESSMENT AND RECOMMENDATIONS:   1. Advance Care Planning: Goal of care: Goals of care is comfort and function. Patient expressed desire to die in home with her family around her when her time comes. Family desire for patient to be comfortable as she lives out the rest of her time, desire for her to have some function and ability to go out in the community. Directives: Code status is DNR. Signed DNR and MOST form in home, copy uploaded to [facility] EMR. MOST form detail include; DNR, Limited additional interventions, determine use or limitation of antibiotics when infection occurs, IV fluids if indicated, feeding tube long term if indicated.  2. Symptom Management: Patient with COPD and HF (EF 20-25%). Activity intolerance: Patient and family report increasing activity intolerance, patient unable to walk or stand for prolong period of time. Family report this is affecting patient's quality of life. Patient report pain in bilateral lower legs relieved with PRN Tramadol, of note patient has chronic pain syndrome. No redness, warmth or swelling noted on exam, pedal pulses  palpable. Patient is morbidly obese, BMI 42kg/m2. Recommendation: Patient will benefit from a manual wheelchair to ease ambulation outside her home as patient enjoys going out with her children. Continue Tramadol as needed.  Insomnia: Patient report being sleepy in the evening after taking Melatonin, report she has to wake up later to take her night time medication. Patient report her bedtime is 10pm. Advised that patient may take Melatonin 71minutes to 1hr before going to bed, reinforced need for good bedtime routine. Provided general support and encouragement, no other unmet needs identified.  3. Follow up Palliative Care Visit: Palliative care will continue to follow for goals of care clarification and symptom management. Return in about 4 weeks or prn.  4. Family /Caregiver/Community Supports: Patient lives at home, has 2 daughters are are very involved in her care. Patient's niece Sheila [name] who is a CNA, is one of the paid caregivers for patient during the day.  5. Cognitive / Functional decline: Patient is dependent on family/caregiver for her ADLs, she is however continent of bowel and bladder, does not wear incontinent pads. Family report occasional incontinence episodes related to urgency, patient also reported to love to drink tomato juice. She is alert and oriented, able to make her own decisions. Ambulates short distances with a Rolator walker, but tires easily. No report of fall or Hospital visit in the last one month.  I spent 48 minutes providing this consultation, time includes time spent with patient/family, chart review, provider coordination, and documentation. More than 50% of the time in this consultation was spent coordinating communication.   HISTORY OF PRESENT ILLNESS:Sheila [name] a 79 y.o.year old femalewith multiple medical problems including heart failure (EF 20-20%), COPD, anxiety (on  xanax), Chronic pain, DJD. Palliative Care was askedto help address advance care  planning and goals of care. This is a follow up visit from 12/08/2019.  CODE STATUS: DNR  PPS: 50%  HOSPICE ELIGIBILITY/DIAGNOSIS: TBD  PHYSICAL EXAM / ROS:   General:  obese, well appearing, cooperative and coherent. Sitting in a recliner in no acute distress, activity participating in discussion. Cardiovascular: denied chest pain, trace edema, S1S2 normal Pulmonary: no cough, no increased SOB, room air, oxygen saturation 95% Abdomen: appetite fair, denied constipation, continent of bowel GU: denies dysuria, continent of urine MSK:  no joint and ROM abnormalities, ambulatory Skin: no rashes or wounds reported or noted on exposed skin Neurological: weakness, but otherwise nonfocal  PAST MEDICAL HISTORY:  Past Medical History:  Diagnosis Date   Allergy    Bell's palsy    Cancer ([place])    skin CA on face   Cataract    Chest pain 07/2009   Myoveiw stress test negative.   Depression    Diverticulosis of colon    DJD (degenerative joint disease) of knee    Dyspnea    on exertion   Family history of adverse reaction to anesthesia    sister ha nausea/vomiting   Gastric ulcer with hemorrhage 01/14/2011   GERD (gastroesophageal reflux disease)    Headache    History of fractured pelvis    Hypercholesteremia    Hypertension    Hypothyroidism    LV dysfunction    UTI (lower urinary tract infection)     SOCIAL HX:  Social History   Tobacco Use   Smoking status: Former Smoker    Types: Cigarettes    Quit date: 02/24/1968    Years since quitting: 51.8   Smokeless tobacco: Current User    Types: Snuff   Tobacco comment: quit yrs and yrs ago when she was very young  Substance Use Topics   Alcohol use: No   FAMILY HX:  Family History  Problem Relation Age of Onset   Aneurysm Mother    Stroke Mother    Hypertension Mother    Cancer Father        lung   Heart disease Sister    Hip fracture Sister    Cancer Brother    Alzheimer's disease  Sister    Cancer Sister        skin, uterine   Cancer Brother     ALLERGIES:  Allergies  Allergen Reactions   Ibuprofen Other (See Comments)    History of bleeding ulcers - contra-indicated    Benadryl [Diphenhydramine Hcl] Other (See Comments)    Worsening depression    Lipitor [Atorvastatin] Other (See Comments)    Body aches.   Hydrocodone Nausea And Vomiting   Codeine Nausea And Vomiting   Morphine And Related Nausea And Vomiting   Tdap [Tetanus-Diphth-Acell Pertussis] Swelling    Redness and localized swelling at injection site      PERTINENT MEDICATIONS:  Outpatient Encounter Medications as of 01/03/2020  Medication Sig   albuterol (PROVENTIL) (2.5 MG/3ML) 0.083% nebulizer solution NEBULIZE 1 VIAL EVERY 6-8 HOURS AS NEEDED FOR WHEEZING OR SHORTNESS OF BREATH   ALPRAZolam (XANAX) 0.5 MG tablet TAKE (1) TABLET TWICE A DAY.   aspirin 81 MG chewable tablet Chew 1 tablet (81 mg total) by mouth daily.   cetirizine (ZYRTEC) 10 MG tablet Take 1 tablet (10 mg total) by mouth daily.   Cholecalciferol (VITAMIN D3) 25 MCG (1000 UT) CAPS Take 1,000 Units by mouth daily.  diclofenac sodium (VOLTAREN) 1 % GEL Apply 2 g topically 4 (four) times daily. (Patient taking differently: Apply 2 g topically 4 (four) times daily as needed (pain). )   DULoxetine (CYMBALTA) 60 MG capsule TAKE 1 CAPUSLE ONCE DAILY   fluticasone (FLONASE) 50 MCG/ACT nasal spray Place 2 sprays into both nostrils daily. (Patient taking differently: Place 2 sprays into both nostrils daily as needed for allergies or rhinitis. )   gabapentin (NEURONTIN) 300 MG capsule TAKE (1) CAPSULE THREE TIMES DAILY.   levothyroxine (SYNTHROID) 100 MCG tablet Take 1 tablet (100 mcg total) by mouth daily.   meclizine (ANTIVERT) 25 MG tablet TAKE (1) TABLET THREE TIMES DAILY AS NEEDED FOR DIZZINESS. (Patient taking differently: Take 25 mg by mouth 3 (three) times daily as needed for dizziness. )   Multiple Vitamin  (MULTIVITAMIN WITH MINERALS) TABS tablet Take 1 tablet by mouth daily.   Omega-3 Fatty Acids (FISH OIL OMEGA-3 PO) Take 1 capsule by mouth every evening.    pantoprazole (PROTONIX) 20 MG tablet Take 1 tablet (20 mg total) by mouth at bedtime.   Pitavastatin Calcium (LIVALO) 4 MG TABS Take 1 tablet (4 mg total) by mouth daily.   potassium chloride SA (KLOR-CON) 20 MEQ tablet TAKE (1) TABLET TWICE A DAY.   sacubitril-valsartan (ENTRESTO) 49-51 MG Take 1 tablet by mouth 2 (two) times daily.   spironolactone (ALDACTONE) 25 MG tablet Take 1 tablet (25 mg total) by mouth daily.   SYMBICORT 160-4.5 MCG/ACT inhaler INHALE 2 PUFFS TWICE A DAY   Tetrahydrozoline HCl (VISINE OP) Place 1 drop into both eyes daily as needed (dry eyes/itching).   torsemide (DEMADEX) 20 MG tablet Take 2 tablets (40 mg total) by mouth daily.   traMADol (ULTRAM) 50 MG tablet Take 50-100 mg by mouth every 12 (twelve) hours as needed.   triamcinolone ointment (KENALOG) 0.5 % Apply 1 application topically 2 (two) times daily. (Patient taking differently: Apply 1 application topically 2 (two) times daily as needed (itching). )   No facility-administered encounter medications on file as of 01/03/2020.     Jari Favre, DNP, AGPCNP-BC

## 2020-01-04 NOTE — Telephone Encounter (Signed)
TC to pt's home, explained an in office or video visit would need to be done in order to place an order for a wheelchair. Appointment made for 01/30/20, they will call back in with phone number to call.

## 2020-01-05 NOTE — Telephone Encounter (Signed)
Left message to call back  

## 2020-01-08 ENCOUNTER — Other Ambulatory Visit: Payer: Self-pay | Admitting: Nurse Practitioner

## 2020-01-08 ENCOUNTER — Other Ambulatory Visit: Payer: Self-pay | Admitting: Family Medicine

## 2020-01-08 DIAGNOSIS — L299 Pruritus, unspecified: Secondary | ICD-10-CM

## 2020-01-09 ENCOUNTER — Ambulatory Visit (INDEPENDENT_AMBULATORY_CARE_PROVIDER_SITE_OTHER): Payer: Medicare Other | Admitting: Nurse Practitioner

## 2020-01-09 ENCOUNTER — Encounter: Payer: Self-pay | Admitting: Nurse Practitioner

## 2020-01-09 DIAGNOSIS — L299 Pruritus, unspecified: Secondary | ICD-10-CM | POA: Diagnosis not present

## 2020-01-09 MED ORDER — TRIAMCINOLONE ACETONIDE 0.5 % EX OINT: TOPICAL_OINTMENT | CUTANEOUS | 1 refills | Status: DC

## 2020-01-09 MED ORDER — CETIRIZINE HCL 10 MG PO TABS: 10.0000 mg | ORAL_TABLET | Freq: Every day | ORAL | 11 refills | Status: DC

## 2020-01-09 NOTE — Progress Notes (Signed)
   Virtual Visit via telephone Note Due to COVID-19 pandemic this visit was conducted virtually. This visit type was conducted due to national recommendations for restrictions regarding the COVID-19 Pandemic (e.g. social distancing, sheltering in place) in an effort to limit this patient's exposure and mitigate transmission in our community. All issues noted in this document were discussed and addressed.  A physical exam was not performed with this format.  I connected with Sheila [name] on 01/09/20 at 8:55 by telephone and verified that I am speaking with the correct person using two identifiers. Sheila [name] is currently located at home and her husband and daughter are currently with her during visit. The provider, Mary-Margaret Hassell Done, FNP is located in their office at time of visit.  I discussed the limitations, risks, security and privacy concerns of performing an evaluation and management service by telephone and the availability of in person appointments. I also discussed with the patient that there may be a patient responsible charge related to this service. The patient expressed understanding and agreed to proceed.   History and Present Illness:   Chief Complaint: Eczema   HPI Patient calls in stating that she is having itching . She says that her legs itch so bad that she can claw the blood out of them. She says she has no rash. She has been seen several times in past with same complaint. OTC meds have not helped in the past. xyzal and kenalog cream has helped in the past.   Review of Systems  Constitutional: Negative for diaphoresis and weight loss.  Eyes: Negative for blurred vision, double vision and pain.  Respiratory: Negative for shortness of breath.   Cardiovascular: Negative for chest pain, palpitations, orthopnea and leg swelling.  Gastrointestinal: Negative for abdominal pain.  Skin: Negative for rash.  Neurological: Negative for dizziness, sensory change, loss of  consciousness, weakness and headaches.  Endo/Heme/Allergies: Negative for polydipsia. Does not bruise/bleed easily.  Psychiatric/Behavioral: Negative for memory loss. The patient does not have insomnia.   All other systems reviewed and are negative.    Observations/Objective: Alert and oriented- answers all questions appropriately No distress Describes lower legs as scaley and itchy- no rash or erythema   Assessment and Plan: Sheila [name] in today with chief complaint of Eczema   1. Pruritus Avoid scratching Avoid hot baths RTO prn  - triamcinolone ointment (KENALOG) 0.5 %; APPLY TO AFFECTED AREAS TWICE A DAY  Dispense: 30 g; Refill: 1     Follow Up Instructions: prn    I discussed the assessment and treatment plan with the patient. The patient was provided an opportunity to ask questions and all were answered. The patient agreed with the plan and demonstrated an understanding of the instructions.   The patient was advised to call back or seek an in-person evaluation if the symptoms worsen or if the condition fails to improve as anticipated.  The above assessment and management plan was discussed with the patient. The patient verbalized understanding of and has agreed to the management plan. Patient is aware to call the clinic if symptoms persist or worsen. Patient is aware when to return to the clinic for a follow-up visit. Patient educated on when it is appropriate to go to the emergency department.   Time call ended:  9:10  I provided 15 minutes of non-face-to-face time during this encounter.    Mary-Margaret Hassell Done, FNP

## 2020-01-10 ENCOUNTER — Ambulatory Visit: Payer: Medicare Other | Admitting: Licensed Clinical Social Worker

## 2020-01-10 DIAGNOSIS — E782 Mixed hyperlipidemia: Secondary | ICD-10-CM

## 2020-01-10 DIAGNOSIS — M170 Bilateral primary osteoarthritis of knee: Secondary | ICD-10-CM

## 2020-01-10 DIAGNOSIS — J441 Chronic obstructive pulmonary disease with (acute) exacerbation: Secondary | ICD-10-CM

## 2020-01-10 DIAGNOSIS — M19011 Primary osteoarthritis, right shoulder: Secondary | ICD-10-CM

## 2020-01-10 DIAGNOSIS — I10 Essential (primary) hypertension: Secondary | ICD-10-CM

## 2020-01-10 DIAGNOSIS — F411 Generalized anxiety disorder: Secondary | ICD-10-CM

## 2020-01-10 NOTE — Patient Instructions (Addendum)
Licensed Clinical Social Worker Visit Information  Materials Provided: No  01/10/2020  Name: Sheila [name]     MRN: [id number]       DOB: 1940/06/21  Sheila [name] is a 79 y.o. year old female who is a primary care patient of Sheila Balloon, FNP. The CCM team was consulted for assistance with Sheila [name] .   Review of patient status, including review of consultants reports, other relevant assessments, and collaboration with appropriate care team members and the patient's provider was performed as part of comprehensive patient evaluation and provision of chronic care management services.    SDOH (Social Determinants of Health) assessments performed  LCSW called client home number several times today but was unable to speak with client or her home health aide. However, LCSW did leave phone message for client and her home health aide to return call to LCSW at [phone]. Home Health Aide for client is Sheila [name], aunt of client  Follow Up Plan:LCSW to call client/Sheila [name]/Sheila [name] next 4 weeks to talk with clientor Sheila [name] or Sheila [name] the social work needs of client at that time  LCSW was not able to speak via phone today with client or Sheila [name], aunt of client; thus, the patient or her aunt were not able to verbalize understanding of instructions provided today and were not able to accept or decline a print copy of patient instruction materials.   Sheila [name].Sheila [name] MSW, LCSW Licensed Clinical Social Worker [facility] [phone]

## 2020-01-10 NOTE — Chronic Care Management (AMB) (Addendum)
Chronic Care Management    Clinical Social Work Follow Up Note  01/10/2020 Name: Sheila [name] MRN: [id number] DOB: 01-28-1941  Sheila [name] is a 79 y.o. year old female who is a primary care patient of Sheila Balloon, FNP. The CCM team was consulted for assistance with [employer] .   Review of patient status, including review of consultants reports, other relevant assessments, and collaboration with appropriate care team members and the patient's provider was performed as part of comprehensive patient evaluation and provision of chronic care management services.    SDOH (Social Determinants of Health) assessments performed: No; risk for tobacco use; risk for depression; risk for stress; risk for physical inactivity    Office Visit from 11/23/2019 in [facility]  PHQ-9 Total Score 9         GAD 7 : Generalized Anxiety Score 11/23/2019 07/26/2019 02/22/2019 05/05/2018  Nervous, Anxious, on Edge 1 1 1 1   Control/stop worrying 1 0 1 0  Worry too much - different things 0 0 1 0  Trouble relaxing 0 0 1 0  Restless 0 0 1 0  Easily annoyed or irritable 2 0 0 0  Afraid - awful might happen 0 0 1 0  Total GAD 7 Score 4 1 6 1   Anxiety Difficulty - Not difficult at all Somewhat difficult Not difficult at all    Outpatient Encounter Medications as of 01/10/2020  Medication Sig   albuterol (PROVENTIL) (2.5 MG/3ML) 0.083% nebulizer solution NEBULIZE 1 VIAL EVERY 6-8 HOURS AS NEEDED FOR WHEEZING OR SHORTNESS OF BREATH   ALPRAZolam (XANAX) 0.5 MG tablet TAKE (1) TABLET TWICE A DAY.   aspirin 81 MG chewable tablet Chew 1 tablet (81 mg total) by mouth daily.   cetirizine (ZYRTEC) 10 MG tablet Take 1 tablet (10 mg total) by mouth daily.   Cholecalciferol (VITAMIN D3) 25 MCG (1000 UT) CAPS Take 1,000 Units by mouth daily.    clotrimazole-betamethasone (LOTRISONE) cream APPLY TO AFFECTED AREAS TWICE A DAY   diclofenac sodium (VOLTAREN) 1 % GEL Apply 2 g topically 4  (four) times daily. (Patient taking differently: Apply 2 g topically 4 (four) times daily as needed (pain). )   DULoxetine (CYMBALTA) 60 MG capsule TAKE 1 CAPUSLE ONCE DAILY   fluticasone (FLONASE) 50 MCG/ACT nasal spray Place 2 sprays into both nostrils daily. (Patient taking differently: Place 2 sprays into both nostrils daily as needed for allergies or rhinitis. )   gabapentin (NEURONTIN) 300 MG capsule TAKE (1) CAPSULE THREE TIMES DAILY.   levothyroxine (SYNTHROID) 100 MCG tablet Take 1 tablet (100 mcg total) by mouth daily.   meclizine (ANTIVERT) 25 MG tablet TAKE (1) TABLET THREE TIMES DAILY AS NEEDED FOR DIZZINESS. (Patient taking differently: Take 25 mg by mouth 3 (three) times daily as needed for dizziness. )   Multiple Vitamin (MULTIVITAMIN WITH MINERALS) TABS tablet Take 1 tablet by mouth daily.   Omega-3 Fatty Acids (FISH OIL OMEGA-3 PO) Take 1 capsule by mouth every evening.    pantoprazole (PROTONIX) 20 MG tablet Take 1 tablet (20 mg total) by mouth at bedtime.   Pitavastatin Calcium (LIVALO) 4 MG TABS Take 1 tablet (4 mg total) by mouth daily.   potassium chloride SA (KLOR-CON) 20 MEQ tablet TAKE (1) TABLET TWICE A DAY.   sacubitril-valsartan (ENTRESTO) 49-51 MG Take 1 tablet by mouth 2 (two) times daily.   spironolactone (ALDACTONE) 25 MG tablet Take 1 tablet (25 mg total) by mouth daily.   SYMBICORT  160-4.5 MCG/ACT inhaler INHALE 2 PUFFS TWICE A DAY   Tetrahydrozoline HCl (VISINE OP) Place 1 drop into both eyes daily as needed (dry eyes/itching).   torsemide (DEMADEX) 20 MG tablet Take 2 tablets (40 mg total) by mouth daily.   traMADol (ULTRAM) 50 MG tablet Take 50-100 mg by mouth every 12 (twelve) hours as needed.   triamcinolone ointment (KENALOG) 0.5 % APPLY TO AFFECTED AREAS TWICE A DAY   No facility-administered encounter medications on file as of 01/10/2020.   LCSW called client home number several times today but was unable to speak with client or her home health aide.  However, LCSW did leave phone message for client and her home health aide to return call to LCSW at [phone]. Home Healht Aide for client is Sheila [name], aunt of client  Follow Up Plan: LCSW to call client/Sheila [name]/Sheila [name] in next 4 weeks to talk with client or Sheila [name] or Sheila [name] about the social work needs of client at that time  Sheila [name].Sheila [name] MSW, LCSW Licensed Clinical Social Worker [facility] [phone]  I have reviewed and agree with the above documentation.   Sheila Dun, FNP

## 2020-01-17 ENCOUNTER — Other Ambulatory Visit: Payer: Self-pay | Admitting: Family Medicine

## 2020-01-26 ENCOUNTER — Ambulatory Visit: Payer: Medicare Other | Admitting: Cardiovascular Disease

## 2020-01-29 ENCOUNTER — Other Ambulatory Visit: Payer: Self-pay | Admitting: Cardiology

## 2020-01-29 ENCOUNTER — Other Ambulatory Visit: Payer: Self-pay | Admitting: Family

## 2020-01-30 ENCOUNTER — Encounter: Payer: Self-pay | Admitting: Family

## 2020-01-30 ENCOUNTER — Telehealth (INDEPENDENT_AMBULATORY_CARE_PROVIDER_SITE_OTHER): Payer: Medicare Other | Admitting: Family

## 2020-01-30 DIAGNOSIS — J441 Chronic obstructive pulmonary disease with (acute) exacerbation: Secondary | ICD-10-CM | POA: Diagnosis not present

## 2020-01-30 DIAGNOSIS — M545 Low back pain, unspecified: Secondary | ICD-10-CM | POA: Diagnosis not present

## 2020-01-30 DIAGNOSIS — M8949 Other hypertrophic osteoarthropathy, multiple sites: Secondary | ICD-10-CM | POA: Diagnosis not present

## 2020-01-30 DIAGNOSIS — G8929 Other chronic pain: Secondary | ICD-10-CM | POA: Diagnosis not present

## 2020-01-30 DIAGNOSIS — M159 Polyosteoarthritis, unspecified: Secondary | ICD-10-CM

## 2020-01-30 DIAGNOSIS — M150 Primary generalized (osteo)arthritis: Secondary | ICD-10-CM

## 2020-01-30 NOTE — Progress Notes (Signed)
   Virtual Visit via telephone Note Due to COVID-19 pandemic this visit was conducted virtually. This visit type was conducted due to national recommendations for restrictions regarding the COVID-19 Pandemic (e.g. social distancing, sheltering in place) in an effort to limit this patient's exposure and mitigate transmission in our community. All issues noted in this document were discussed and addressed.  A physical exam was not performed with this format.  I connected with Sheila [name] on 01/30/20 at 12:46 pm  by video and verified that I am speaking with the correct person using two identifiers. Sheila [name] is currently located at home  and daughter is currently with her during visit. The provider, [name], FNP is located in their office at time of visit.  I discussed the limitations, risks, security and privacy concerns of performing an evaluation and management service by telephone and the availability of in person appointments. I also discussed with the patient that there may be a patient responsible charge related to this service. The patient expressed understanding and agreed to proceed.   History and Present Illness:  Pt calls the office today with weakness, arthritis, and increased risk of falls. She has some staying with her 24 hours a day. She reports when she does go out she can only walk a few feet without "giving out".  She has COPD and does have SOB with exertion.  Arthritis Presents for follow-up visit. She complains of pain, stiffness and joint swelling. The symptoms have been stable. Affected locations include the left knee and right knee. Her pain is at a severity of 5/10.      Review of Systems  Musculoskeletal: Positive for arthritis, joint swelling and stiffness.  All other systems reviewed and are negative.    Observations/Objective: No SOB or distress noted, patient sitting in recliner.  Assessment and Plan: 1. Primary osteoarthritis involving multiple  joints - DME Wheelchair manual  2. Chronic obstructive pulmonary disease with acute exacerbation ([place]) - DME Wheelchair manual  3. Chronic bilateral low back pain, unspecified whether sciatica present - DME Wheelchair manual  4. Morbid obesity ([place]) - DME Wheelchair manual     I discussed the assessment and treatment plan with the patient. The patient was provided an opportunity to ask questions and all were answered. The patient agreed with the plan and demonstrated an understanding of the instructions.   The patient was advised to call back or seek an in-person evaluation if the symptoms worsen or if the condition fails to improve as anticipated.  The above assessment and management plan was discussed with the patient. The patient verbalized understanding of and has agreed to the management plan. Patient is aware to call the clinic if symptoms persist or worsen. Patient is aware when to return to the clinic for a follow-up visit. Patient educated on when it is appropriate to go to the emergency department.   Time call ended: 1:01 pm    I provided 15 minutes of  face-to-face time during this encounter.    [name], FNP

## 2020-02-06 ENCOUNTER — Ambulatory Visit: Payer: Medicare Other | Admitting: General Practice

## 2020-02-07 ENCOUNTER — Telehealth: Payer: Self-pay

## 2020-02-07 NOTE — Telephone Encounter (Signed)
Pt says she is taking her fluid pill and should be urinating more frequently but isnt. Wants to know what this means and what Alyse Low wants her to do.  Please advise and call patient.

## 2020-02-07 NOTE — Telephone Encounter (Signed)
This is a low dose fluid pill. Does she have a lot of edema? Is she weighing herself daily? Any SOB? She may need to be seen face to face.

## 2020-02-07 NOTE — Telephone Encounter (Signed)
Please advise, patient is taking Spironolactone 25 mg daily is concerned that it is not causing her to urinate more.

## 2020-02-08 NOTE — Telephone Encounter (Signed)
Daughter returned call.  States that she does not see any swelling but last time she had swelling around her heart.  States she is not urinating like she used to.  Also states she has not been weighing her and no SOB- just wheezing a little more than normal.  There are no openings the rest of the week with any providers- please advise

## 2020-02-08 NOTE — Telephone Encounter (Signed)
lmtcb

## 2020-02-09 ENCOUNTER — Telehealth: Payer: Self-pay

## 2020-02-09 DIAGNOSIS — M8949 Other hypertrophic osteoarthropathy, multiple sites: Secondary | ICD-10-CM

## 2020-02-09 DIAGNOSIS — M159 Polyosteoarthritis, unspecified: Secondary | ICD-10-CM

## 2020-02-09 DIAGNOSIS — J441 Chronic obstructive pulmonary disease with (acute) exacerbation: Secondary | ICD-10-CM

## 2020-02-09 DIAGNOSIS — G8929 Other chronic pain: Secondary | ICD-10-CM

## 2020-02-09 DIAGNOSIS — M545 Low back pain, unspecified: Secondary | ICD-10-CM

## 2020-02-09 DIAGNOSIS — M150 Primary generalized (osteo)arthritis: Secondary | ICD-10-CM

## 2020-02-09 NOTE — Telephone Encounter (Signed)
Patient aware and verbalizes understanding - NO SOB and appointment made

## 2020-02-09 NOTE — Telephone Encounter (Signed)
  Prescription Request  02/09/2020  What is the name of the medication or equipment? Wheelchair  Have you contacted your pharmacy to request a refill? (if applicable) yes  Which pharmacy would you like this sent to? [facility]   Patient notified that their request is being sent to the clinical staff for review and that they should receive a response within 2 business days.   Lenna Gilford' pt.  Please call daughter.

## 2020-02-09 NOTE — Telephone Encounter (Signed)
Please make her a follow up visit. If she has any increased  SOB go to ED. She needs to weigh herself daily, low salt diet, and continue medications daily.

## 2020-02-12 NOTE — Telephone Encounter (Signed)
Rx has been faxed- daughter aware

## 2020-02-12 NOTE — Telephone Encounter (Signed)
Please fax rx to pharmacy

## 2020-02-15 ENCOUNTER — Ambulatory Visit (INDEPENDENT_AMBULATORY_CARE_PROVIDER_SITE_OTHER): Payer: Medicare Other | Admitting: Family

## 2020-02-15 ENCOUNTER — Telehealth: Payer: Medicare Other

## 2020-02-15 ENCOUNTER — Other Ambulatory Visit: Payer: Self-pay | Admitting: Family

## 2020-02-15 ENCOUNTER — Encounter: Payer: Self-pay | Admitting: Family

## 2020-02-15 ENCOUNTER — Other Ambulatory Visit: Payer: Self-pay | Admitting: Family Medicine

## 2020-02-15 ENCOUNTER — Other Ambulatory Visit: Payer: Self-pay

## 2020-02-15 VITALS — BP 132/89 | HR 94 | Temp 98.0°F | Ht 59.0 in | Wt 213.6 lb

## 2020-02-15 DIAGNOSIS — M1990 Unspecified osteoarthritis, unspecified site: Secondary | ICD-10-CM

## 2020-02-15 DIAGNOSIS — I428 Other cardiomyopathies: Secondary | ICD-10-CM

## 2020-02-15 DIAGNOSIS — Z23 Encounter for immunization: Secondary | ICD-10-CM

## 2020-02-15 DIAGNOSIS — R39198 Other difficulties with micturition: Secondary | ICD-10-CM

## 2020-02-15 DIAGNOSIS — R34 Anuria and oliguria: Secondary | ICD-10-CM

## 2020-02-15 DIAGNOSIS — Z9181 History of falling: Secondary | ICD-10-CM | POA: Diagnosis not present

## 2020-02-15 LAB — URINALYSIS, COMPLETE
Bilirubin, UA: NEGATIVE
Glucose, UA: NEGATIVE
Ketones, UA: NEGATIVE
Nitrite, UA: NEGATIVE
Protein,UA: NEGATIVE
RBC, UA: NEGATIVE
Specific Gravity, UA: 1.015 (ref 1.005–1.030)
Urobilinogen, Ur: 0.2 mg/dL (ref 0.2–1.0)
pH, UA: 6.5 (ref 5.0–7.5)

## 2020-02-15 LAB — MICROSCOPIC EXAMINATION

## 2020-02-15 NOTE — Progress Notes (Signed)
Subjective:    Patient ID: Sheila [name], female    DOB: 1940-04-01, 79 y.o.   MRN: [id number]  Chief Complaint  Patient presents with  . Urinary Retention  . Dysuria    HPI Pt presents to the office today with decreased urine output over the last two weeks. Denies any fever or dysuria, but states she is having to strain when urinating. States she does have urgency and has had incontinence at times.     She is taking Demadex 20 mg daily. Denies any new SOB, edema, or weight gain. Has SOB with exertion, but this is stable.   Daughter is requesting a prescription for wheelchair. She is currently using a rolling walker, but can not walk more than 100 feet. Daughter states going to the grocery store and other places she needs to be pushed.   Review of Systems  All other systems reviewed and are negative.      Objective:   Physical Exam Vitals reviewed.  Constitutional:      General: She is not in acute distress.    Appearance: She is well-developed and well-nourished.  HENT:     Head: Normocephalic and atraumatic.     Right Ear: Tympanic membrane normal.     Left Ear: Tympanic membrane normal.     Mouth/Throat:     Mouth: Oropharynx is clear and moist.  Eyes:     Pupils: Pupils are equal, round, and reactive to light.  Neck:     Thyroid: No thyromegaly.  Cardiovascular:     Rate and Rhythm: Normal rate and regular rhythm.     Pulses: Intact distal pulses.     Heart sounds: Normal heart sounds. No murmur heard.   Pulmonary:     Effort: Pulmonary effort is normal. No respiratory distress.     Breath sounds: Normal breath sounds. No wheezing.  Abdominal:     General: Bowel sounds are normal. There is no distension.     Palpations: Abdomen is soft.     Tenderness: There is no abdominal tenderness.  Musculoskeletal:        General: No tenderness or edema. Normal range of motion.     Cervical back: Normal range of motion and neck supple.  Skin:    General: Skin is warm  and dry.  Neurological:     Mental Status: She is alert and oriented to person, place, and time.     Cranial Nerves: No cranial nerve deficit.     Deep Tendon Reflexes: Reflexes are normal and symmetric.  Psychiatric:        Mood and Affect: Mood and affect normal.        Behavior: Behavior normal.        Thought Content: Thought content normal.        Judgment: Judgment normal.     BP 132/89   Pulse 94   Temp 98 F (36.7 C) (Temporal)   Ht $R'4\' 11"'Sl$  (1.499 m)   Wt 213 lb 9.6 oz (96.9 kg)   BMI 43.14 kg/m      Assessment & Plan:  Sheila [name] comes in today with chief complaint of Urinary Retention   Diagnosis and orders addressed:  1. Difficulty urinating - Urinalysis, Complete - Urine Culture; Future - Urine Culture - BMP8+EGFR - CBC with Differential/Platelet  2. Decreased urine output - BMP8+EGFR - CBC with Differential/Platelet  3. Morbid obesity ([place]) - DME Wheelchair manual - BMP8+EGFR - CBC with Differential/Platelet  4. NICM (  nonischemic cardiomyopathy) (HCC) - BMP8+EGFR - CBC with Differential/Platelet  5. At high risk for falls - DME Wheelchair manual - BMP8+EGFR - CBC with Differential/Platelet  6. Osteoarthritis, unspecified osteoarthritis type, unspecified site - DME Wheelchair manual - BMP8+EGFR - CBC with Differential/Platelet   Labs pending No infection present  Pt looks the best I have seen in months.  Flu given today Keep Cardiologists appt    [name], FNP

## 2020-02-16 LAB — CBC WITH DIFFERENTIAL/PLATELET
Basophils Absolute: 0.1 10*3/uL (ref 0.0–0.2)
Basos: 1 %
EOS (ABSOLUTE): 0.2 10*3/uL (ref 0.0–0.4)
Eos: 2 %
Hematocrit: 43.2 % (ref 34.0–46.6)
Hemoglobin: 14.5 g/dL (ref 11.1–15.9)
Immature Grans (Abs): 0 10*3/uL (ref 0.0–0.1)
Immature Granulocytes: 0 %
Lymphocytes Absolute: 2.7 10*3/uL (ref 0.7–3.1)
Lymphs: 27 %
MCH: 28.9 pg (ref 26.6–33.0)
MCHC: 33.6 g/dL (ref 31.5–35.7)
MCV: 86 fL (ref 79–97)
Monocytes Absolute: 0.7 10*3/uL (ref 0.1–0.9)
Monocytes: 7 %
Neutrophils Absolute: 6.3 10*3/uL (ref 1.4–7.0)
Neutrophils: 63 %
Platelets: 292 10*3/uL (ref 150–450)
RBC: 5.01 x10E6/uL (ref 3.77–5.28)
RDW: 15.3 % (ref 11.7–15.4)
WBC: 9.9 10*3/uL (ref 3.4–10.8)

## 2020-02-16 LAB — BMP8+EGFR
BUN/Creatinine Ratio: 24 (ref 12–28)
BUN: 27 mg/dL (ref 8–27)
CO2: 22 mmol/L (ref 20–29)
Calcium: 11.2 mg/dL — ABNORMAL HIGH (ref 8.7–10.3)
Chloride: 100 mmol/L (ref 96–106)
Creatinine, Ser: 1.12 mg/dL — ABNORMAL HIGH (ref 0.57–1.00)
GFR calc Af Amer: 54 mL/min/{1.73_m2} — ABNORMAL LOW (ref >59)
GFR calc non Af Amer: 47 mL/min/{1.73_m2} — ABNORMAL LOW (ref >59)
Glucose: 110 mg/dL — ABNORMAL HIGH (ref 65–99)
Potassium: 5.2 mmol/L (ref 3.5–5.2)
Sodium: 139 mmol/L (ref 134–144)

## 2020-02-17 LAB — URINE CULTURE

## 2020-02-22 ENCOUNTER — Other Ambulatory Visit: Payer: Self-pay | Admitting: Family

## 2020-02-27 ENCOUNTER — Other Ambulatory Visit: Payer: Self-pay | Admitting: Cardiology

## 2020-03-01 ENCOUNTER — Telehealth: Payer: Self-pay

## 2020-03-01 NOTE — Telephone Encounter (Signed)
NA / disconnected tried x 3. Information sent to [facility]

## 2020-03-05 ENCOUNTER — Telehealth: Payer: Self-pay

## 2020-03-05 ENCOUNTER — Other Ambulatory Visit: Payer: Medicare Other | Admitting: Nurse Practitioner

## 2020-03-05 NOTE — Telephone Encounter (Signed)
Daughter is aware her potasium number and verbalizes understanding per dpr.

## 2020-03-05 NOTE — Telephone Encounter (Signed)
Daughter aware of lab results per dpr and verbalizes understanding.

## 2020-03-18 ENCOUNTER — Other Ambulatory Visit: Payer: Self-pay | Admitting: Family

## 2020-03-18 DIAGNOSIS — F331 Major depressive disorder, recurrent, moderate: Secondary | ICD-10-CM

## 2020-03-18 DIAGNOSIS — F411 Generalized anxiety disorder: Secondary | ICD-10-CM

## 2020-03-20 ENCOUNTER — Other Ambulatory Visit: Payer: Medicare Other | Admitting: Nurse Practitioner

## 2020-03-20 ENCOUNTER — Other Ambulatory Visit: Payer: Self-pay | Admitting: Family

## 2020-03-20 ENCOUNTER — Other Ambulatory Visit: Payer: Self-pay

## 2020-03-20 ENCOUNTER — Telehealth: Payer: Self-pay

## 2020-03-20 DIAGNOSIS — G629 Polyneuropathy, unspecified: Secondary | ICD-10-CM

## 2020-03-20 DIAGNOSIS — Z515 Encounter for palliative care: Secondary | ICD-10-CM | POA: Diagnosis not present

## 2020-03-20 DIAGNOSIS — R52 Pain, unspecified: Secondary | ICD-10-CM

## 2020-03-20 NOTE — Progress Notes (Addendum)
Sheila [name] Consult Note Telephone: [phone]  Fax: [phone]  PATIENT NAME: Sheila [name] [address] [place] [address] [phone] (home)  DOB: 02-08-1941 MRN: [id number]  PRIMARY CARE PROVIDER:    Sharion Balloon, FNP,  [address] [place] [address] [phone]  REFERRING PROVIDER:   Sharion [name], Sheila [name],  Sheila [name] [address] [phone]  RESPONSIBLE PARTY:   Extended Emergency Contact Information Primary Emergency Contact: Sheila [name], Sheila [name] Phone: [phone] Mobile Phone: [phone] Relation: Niece Secondary Emergency Contact: Sheila [name] [place] Phone: [phone] Mobile Phone: [phone] Relation: Daughter  I met face to face with patient and family in home.  ASSESSMENT AND RECOMMENDATIONS:   Advance Care Planning: Patient's goals of care is comfort and function. Patientexpressed desire toend her days in her home sourrounded by her family. Family desire for patient to be comfortable as she lives out the rest of her time, desire for her to have some function and ability to go out in the community. Signed DNR and MOST form in home, copy on [facility] EMR. MOST form details include; DNR, limited additional interventions, determine use or limitation of antibiotics when infection occurs, IV fluids if indicated, feeding tube long term if indicated.  Symptom Management:  Pain: Patient complained of pain in left her hip. Patient with history of osteoarthritis. Report pain started yesterday, pain non-radiating. Pain agravated by activity and relieved with rest. Patient has used tramadol with moderated relief, heating pad provides some relief. Patient denied any fall or trauma to site. No report of rash or lesion to area. She currently denied pain, report pain gets as high as 10/10 with movement. Plan: Continue use of heating pad. Continue  Voltaren gel apply to affected area as needed. Patient on Tramadol, report having only 3 tablets left with no refill. Start Tramadol $RemoveBefore'50mg'[id number]$  by mouth every 8hr as needed for moderate to severe pain. Prescription sent to [facility], ordered 21 tablets. Patient to notify PCP if worsening pain, or pain not relieved. Consider imaging if worsening pain.  Follow up Palliative Care Visit: Palliative care will continue to follow for goals of care clarification and symptom management. Return in about 4 weeks or prn.  Family /Caregiver/Community Supports: Patient lives at home. Has a paid caregiver during the day, her family fills in as needed. Family very involved in her care.  Cognitive / Functional decline: She is alert and oriented, able to make her own decisions. She is dependent on family and her caregiver for her ADLs. Family report occasional incontinence episodes related to urgency.Ambulates short distances with a Rolator walker, but tires easily. No report of fall or hospitalization since last palliative care visit.  I spent 40 minutes providing this consultation, time includes time spent with patient and family, chart review, provider coordination, and documentation. More than 50% of the time in this consultation was spent counseling and coordinating communication.   CHIEF COMPLAINT: Left hip pain  HISTORY OF PRESENT ILLNESS:Sheila [name] a 80 y.o.year old femalewith multiple medical problems including heart failure (EF 20-20%), COPD, anxiety (on xanax), Chronic pain, DJD.Palliative Care was askedto help address advance care planning and complex decision making.This is a follow up visit from 01/03/2020.  CODE STATUS: DNR  PPS: 50%  HOSPICE ELIGIBILITY/DIAGNOSIS: TBD  PHYSICAL EXAM / ROS:   General: obese, wellappearing,cooperative and coherent. Sitting in a recliner in no acute distress Cardiovascular:deniedchest pain, noedema, S1S2 normal  Pulmonary: no cough, no SOB,  oxygen saturation 94% on room air Abdomen: appetite fair,deniedconstipation, continent of bowel GU: denies dysuria, continent of urine MSK: no joint and ROM abnormalities, ambulatory Skin: no rashes or wounds reportedor noted on exposed skin Neurological: weakness, but otherwise nonfocal  PAST MEDICAL HISTORY:  Past Medical History:  Diagnosis Date  . Allergy   . Bell's palsy   . Cancer ([place])    skin CA on face  . Cataract   . Chest pain 07/2009   Myoveiw stress test negative.  . Depression   . Diverticulosis of colon   . DJD (degenerative joint disease) of knee   . Dyspnea    on exertion  . Family history of adverse reaction to anesthesia    sister ha nausea/vomiting  . Gastric ulcer with hemorrhage 01/14/2011  . GERD (gastroesophageal reflux disease)   . Headache   . History of fractured pelvis   . Hypercholesteremia   . Hypertension   . Hypothyroidism   . LV dysfunction   . UTI (lower urinary tract infection)     SOCIAL HX:  Social History   Tobacco Use  . Smoking status: Former Smoker    Types: Cigarettes    Quit date: 02/24/1968    Years since quitting: 52.1  . Smokeless tobacco: Current User    Types: Snuff  . Tobacco comment: quit yrs and yrs ago when she was very young  Substance Use Topics  . Alcohol use: No   FAMILY HX:  Family History  Problem Relation Age of Onset  . Aneurysm Mother   . Stroke Mother   . Hypertension Mother   . Cancer Father        lung  . Heart disease Sister   . Hip fracture Sister   . Cancer Brother   . Alzheimer's disease Sister   . Cancer Sister        skin, uterine  . Cancer Brother     ALLERGIES:  Allergies  Allergen Reactions  . Ibuprofen Other (See Comments)    History of bleeding ulcers - contra-indicated   . Benadryl [Diphenhydramine Hcl] Other (See Comments)    Worsening depression   . Lipitor [Atorvastatin] Other (See Comments)    Body aches.  . Hydrocodone Nausea And Vomiting  . Codeine Nausea And  Vomiting  . Morphine And Related Nausea And Vomiting  . Tdap [Tetanus-Diphth-Acell Pertussis] Swelling    Redness and localized swelling at injection site      PERTINENT MEDICATIONS:  Outpatient Encounter Medications as of 03/20/2020  Medication Sig  . albuterol (PROVENTIL) (2.5 MG/3ML) 0.083% nebulizer solution NEBULIZE 1 VIAL EVERY 6-8 HOURS AS NEEDED FOR WHEEZING OR SHORTNESS OF BREATH  . ALPRAZolam (XANAX) 0.5 MG tablet TAKE (1) TABLET TWICE A DAY.  [name] aspirin 81 MG chewable tablet Chew 1 tablet (81 mg total) by mouth daily.  . Cholecalciferol (VITAMIN D3) 25 MCG (1000 UT) CAPS Take 1,000 Units by mouth daily.   . clotrimazole-betamethasone (LOTRISONE) cream APPLY TO AFFECTED AREAS TWICE A DAY  . diclofenac sodium (VOLTAREN) 1 % GEL Apply 2 g topically 4 (four) times daily. (Patient taking differently: Apply 2 g topically 4 (four) times daily as needed (pain).)  . DULoxetine (CYMBALTA) 60 MG capsule TAKE 1 CAPUSLE ONCE DAILY  . fluticasone (FLONASE) 50 MCG/ACT nasal spray PLACE 2 SPRAYS INTO BOTH NOSTRILS DAILY  . gabapentin (NEURONTIN) 300 MG capsule TAKE (1) CAPSULE THREE TIMES DAILY.  [name] levocetirizine (XYZAL) 5 MG tablet TAKE 1  TABLET ONCE DAILY IN THE EVENING  . levothyroxine (SYNTHROID) 100 MCG tablet Take 1 tablet (100 mcg total) by mouth daily.  [name] LIVALO 4 MG TABS TAKE 1 TABLET DAILY  . meclizine (ANTIVERT) 25 MG tablet TAKE (1) TABLET THREE TIMES DAILY AS NEEDED FOR DIZZINESS. (Patient taking differently: Take 25 mg by mouth 3 (three) times daily as needed for dizziness.)  . Multiple Vitamin (MULTIVITAMIN WITH MINERALS) TABS tablet Take 1 tablet by mouth daily.  . Omega-3 Fatty Acids (FISH OIL OMEGA-3 PO) Take 1 capsule by mouth every evening.   . pantoprazole (PROTONIX) 20 MG tablet Take 1 tablet (20 mg total) by mouth at bedtime.  . potassium chloride SA (KLOR-CON) 20 MEQ tablet TAKE (1) TABLET TWICE A DAY.  . sacubitril-valsartan (ENTRESTO) 49-51 MG Take 1 tablet by mouth 2  (two) times daily.  [name] spironolactone (ALDACTONE) 25 MG tablet Take 1 tablet (25 mg total) by mouth daily.  . SYMBICORT 160-4.5 MCG/ACT inhaler INHALE 2 PUFFS TWICE A DAY  . Tetrahydrozoline HCl (VISINE OP) Place 1 drop into both eyes daily as needed (dry eyes/itching).  . torsemide (DEMADEX) 20 MG tablet Take 2 tablets (40 mg total) by mouth daily.  . traMADol (ULTRAM) 50 MG tablet Take 50-100 mg by mouth every 12 (twelve) hours as needed.  . triamcinolone ointment (KENALOG) 0.5 % APPLY TO AFFECTED AREAS TWICE A DAY   No facility-administered encounter medications on file as of 03/20/2020.    Thank you for the opportunity to participate in the care of Ms.Valere Dross . The palliative care team will continue to follow. Please call our office at [phone] if we can be of additional assistance.  Jari Favre, DNP, AGPCNP-BC

## 2020-03-20 NOTE — Telephone Encounter (Signed)
Left message to call back.  Hilda Blades is not on patient's HIPPA form and therefore we will be unable to discuss treatment with her.

## 2020-03-21 NOTE — Telephone Encounter (Signed)
LMOVM notes faxed to [facility]

## 2020-03-25 ENCOUNTER — Encounter: Payer: Self-pay | Admitting: Family

## 2020-03-25 ENCOUNTER — Ambulatory Visit (INDEPENDENT_AMBULATORY_CARE_PROVIDER_SITE_OTHER): Payer: Medicare Other | Admitting: Family

## 2020-03-25 ENCOUNTER — Other Ambulatory Visit: Payer: Self-pay

## 2020-03-25 ENCOUNTER — Ambulatory Visit: Payer: Medicare Other

## 2020-03-25 VITALS — BP 135/90 | HR 97 | Temp 97.4°F | Ht 59.0 in | Wt 214.2 lb

## 2020-03-25 DIAGNOSIS — M8949 Other hypertrophic osteoarthropathy, multiple sites: Secondary | ICD-10-CM | POA: Diagnosis not present

## 2020-03-25 DIAGNOSIS — Z79899 Other long term (current) drug therapy: Secondary | ICD-10-CM

## 2020-03-25 DIAGNOSIS — F1320 Sedative, hypnotic or anxiolytic dependence, uncomplicated: Secondary | ICD-10-CM

## 2020-03-25 DIAGNOSIS — E782 Mixed hyperlipidemia: Secondary | ICD-10-CM | POA: Diagnosis not present

## 2020-03-25 DIAGNOSIS — F331 Major depressive disorder, recurrent, moderate: Secondary | ICD-10-CM

## 2020-03-25 DIAGNOSIS — F411 Generalized anxiety disorder: Secondary | ICD-10-CM

## 2020-03-25 DIAGNOSIS — E039 Hypothyroidism, unspecified: Secondary | ICD-10-CM | POA: Diagnosis not present

## 2020-03-25 DIAGNOSIS — J441 Chronic obstructive pulmonary disease with (acute) exacerbation: Secondary | ICD-10-CM | POA: Diagnosis not present

## 2020-03-25 DIAGNOSIS — I10 Essential (primary) hypertension: Secondary | ICD-10-CM | POA: Diagnosis not present

## 2020-03-25 DIAGNOSIS — M159 Polyosteoarthritis, unspecified: Secondary | ICD-10-CM

## 2020-03-25 MED ORDER — TRAMADOL HCL 50 MG PO TABS: 50.0000 mg | ORAL_TABLET | Freq: Two times a day (BID) | ORAL | 2 refills | Status: DC | PRN

## 2020-03-25 MED ORDER — ALPRAZOLAM 0.5 MG PO TABS: ORAL_TABLET | ORAL | 5 refills | Status: DC

## 2020-03-25 NOTE — Patient Instructions (Signed)

## 2020-03-25 NOTE — Progress Notes (Signed)
Subjective:    Patient ID: Sheila [name], female    DOB: Apr 03, 1940, 80 y.o.   MRN: [id number]  Chief Complaint  Patient presents with  . Pain Management  . Urine Output    Cant pee states her urine was checked last time but it did not show nothing.    Pt presents to the office today for chronic follow up. She is followed by Cardiologists every 3 months for CAD and CHF. She is currently on palliative care and has an aid that stays with her 6 hours Monday-Friday and has family staying the rest of the time. She has someone with her at all times.   She reports she is having a hard time walking long distances. She has to use a rolling walker at all times, however, she can not walk more than a few feet without having to relax.  Hypertension This is a chronic problem. The current episode started more than 1 year ago. The problem has been resolved since onset. The problem is controlled. Associated symptoms include anxiety and malaise/fatigue. Pertinent negatives include no peripheral edema or shortness of breath. Risk factors for coronary artery disease include dyslipidemia, obesity and sedentary lifestyle. The current treatment provides moderate improvement. Identifiable causes of hypertension include a thyroid problem.  Thyroid Problem Presents for follow-up visit. Symptoms include anxiety and fatigue. Patient reports no constipation or depressed mood. The symptoms have been stable.  Arthritis Presents for follow-up visit. She complains of pain, stiffness and joint warmth. The symptoms have been stable. Affected locations include the left knee, right knee, left hip and right hip. Her pain is at a severity of 9/10. Associated symptoms include fatigue.  Depression        This is a chronic problem.  The current episode started more than 1 year ago.   The onset quality is gradual.   The problem occurs intermittently.  The problem has been waxing and waning since onset.  Associated symptoms include  fatigue, irritable, restlessness and sad.  Associated symptoms include no helplessness and no hopelessness.  Past medical history includes thyroid problem and anxiety.   Anxiety Presents for follow-up visit. Symptoms include excessive worry, irritability, nervous/anxious behavior, panic and restlessness. Patient reports no depressed mood or shortness of breath. The severity of symptoms is moderate.        Review of Systems  Constitutional: Positive for fatigue, irritability and malaise/fatigue.  Respiratory: Negative for shortness of breath.   Gastrointestinal: Negative for constipation.  Musculoskeletal: Positive for arthritis and stiffness.  Psychiatric/Behavioral: Positive for depression. The patient is nervous/anxious.   All other systems reviewed and are negative.      Objective:   Physical Exam Vitals reviewed.  Constitutional:      General: She is irritable. She is not in acute distress.    Appearance: She is well-developed and well-nourished. She is obese.  HENT:     Head: Normocephalic and atraumatic.     Right Ear: Tympanic membrane normal.     Left Ear: Tympanic membrane normal.     Mouth/Throat:     Mouth: Oropharynx is clear and moist.  Eyes:     Pupils: Pupils are equal, round, and reactive to light.  Neck:     Thyroid: No thyromegaly.  Cardiovascular:     Rate and Rhythm: Normal rate and regular rhythm.     Pulses: Intact distal pulses.     Heart sounds: Normal heart sounds. No murmur heard.   Pulmonary:  Effort: Pulmonary effort is normal. No respiratory distress.     Breath sounds: Normal breath sounds. Decreased air movement present. No wheezing or rhonchi.  Abdominal:     General: Bowel sounds are normal. There is no distension.     Palpations: Abdomen is soft.     Tenderness: There is no abdominal tenderness.  Musculoskeletal:        General: No tenderness or edema. Normal range of motion.     Cervical back: Normal range of motion and neck  supple.  Skin:    General: Skin is warm and dry.  Neurological:     Mental Status: She is alert and oriented to person, place, and time.     Cranial Nerves: No cranial nerve deficit.     Motor: Weakness present.     Gait: Gait abnormal.     Deep Tendon Reflexes: Reflexes are normal and symmetric.  Psychiatric:        Mood and Affect: Mood and affect normal.        Behavior: Behavior normal.        Thought Content: Thought content normal.        Judgment: Judgment normal.       BP 135/90   Pulse 97   Temp (!) 97.4 F (36.3 C) (Temporal)   Ht $R'4\' 11"'je$  (1.499 m)   Wt 214 lb 3.2 oz (97.2 kg)   SpO2 96%   BMI 43.26 kg/m   Assessment & Plan:  NAIYA CORRAL comes in today with chief complaint of Pain Management and Urine Output (Cant pee states her urine was checked last time but it did not show nothing.)   Diagnosis and orders addressed:  1. Essential hypertension, benign - CBC with Differential/Platelet - CMP14+EGFR - DME Wheelchair manual  2. Chronic obstructive pulmonary disease with acute exacerbation (HCC) - CBC with Differential/Platelet - CMP14+EGFR - DME Wheelchair manual  3. Hypothyroidism, unspecified type - CBC with Differential/Platelet - CMP14+EGFR - TSH - DME Wheelchair manual  4. Primary osteoarthritis involving multiple joints - CBC with Differential/Platelet - CMP14+EGFR - DME Wheelchair manual  5. Benzodiazepine dependence (HCC) - ALPRAZolam (XANAX) 0.5 MG tablet; TAKE (1) TABLET TWICE A DAY.  Dispense: 60 tablet; Refill: 5 - CBC with Differential/Platelet - CMP14+EGFR - DME Wheelchair manual  6. Moderate episode of recurrent major depressive disorder (HCC) - CBC with Differential/Platelet - CMP14+EGFR - DME Wheelchair manual  7. GAD (generalized anxiety disorder) - ALPRAZolam (XANAX) 0.5 MG tablet; TAKE (1) TABLET TWICE A DAY.  Dispense: 60 tablet; Refill: 5 - CBC with Differential/Platelet - CMP14+EGFR - DME Wheelchair manual  8.  Mixed hyperlipidemia - CBC with Differential/Platelet - CMP14+EGFR - DME Wheelchair manual  9. Morbid obesity ([place]) - CBC with Differential/Platelet - CMP14+EGFR - DME Wheelchair manual  10. Controlled substance agreement signed - ALPRAZolam (XANAX) 0.5 MG tablet; TAKE (1) TABLET TWICE A DAY.  Dispense: 60 tablet; Refill: 5 - CBC with Differential/Platelet - CMP14+EGFR - DME Wheelchair manual   Labs pending Patient reviewed in [place] controlled database, no flags noted. Contract and drug screen are up to date.  Health Maintenance reviewed Diet and exercise encouraged  Follow up plan: 3  Months    [name], FNP

## 2020-03-26 DIAGNOSIS — M1990 Unspecified osteoarthritis, unspecified site: Secondary | ICD-10-CM | POA: Diagnosis not present

## 2020-03-26 LAB — CMP14+EGFR
ALT: 26 IU/L (ref 0–32)
AST: 33 IU/L (ref 0–40)
Albumin/Globulin Ratio: 1.8 (ref 1.2–2.2)
Albumin: 4.9 g/dL — ABNORMAL HIGH (ref 3.7–4.7)
Alkaline Phosphatase: 64 IU/L (ref 44–121)
BUN/Creatinine Ratio: 26 (ref 12–28)
BUN: 26 mg/dL (ref 8–27)
Bilirubin Total: 0.7 mg/dL (ref 0.0–1.2)
CO2: 21 mmol/L (ref 20–29)
Calcium: 10.9 mg/dL — ABNORMAL HIGH (ref 8.7–10.3)
Chloride: 99 mmol/L (ref 96–106)
Creatinine, Ser: 1.01 mg/dL — ABNORMAL HIGH (ref 0.57–1.00)
GFR calc Af Amer: 62 mL/min/{1.73_m2} (ref >59)
GFR calc non Af Amer: 53 mL/min/{1.73_m2} — ABNORMAL LOW (ref >59)
Globulin, Total: 2.7 g/dL (ref 1.5–4.5)
Glucose: 98 mg/dL (ref 65–99)
Potassium: 4.8 mmol/L (ref 3.5–5.2)
Sodium: 137 mmol/L (ref 134–144)
Total Protein: 7.6 g/dL (ref 6.0–8.5)

## 2020-03-26 LAB — CBC WITH DIFFERENTIAL/PLATELET
Basophils Absolute: 0.1 10*3/uL (ref 0.0–0.2)
Basos: 1 %
EOS (ABSOLUTE): 0.2 10*3/uL (ref 0.0–0.4)
Eos: 1 %
Hematocrit: 42.4 % (ref 34.0–46.6)
Hemoglobin: 14.5 g/dL (ref 11.1–15.9)
Immature Grans (Abs): 0 10*3/uL (ref 0.0–0.1)
Immature Granulocytes: 0 %
Lymphocytes Absolute: 3.3 10*3/uL — ABNORMAL HIGH (ref 0.7–3.1)
Lymphs: 26 %
MCH: 29.9 pg (ref 26.6–33.0)
MCHC: 34.2 g/dL (ref 31.5–35.7)
MCV: 87 fL (ref 79–97)
Monocytes Absolute: 0.7 10*3/uL (ref 0.1–0.9)
Monocytes: 6 %
Neutrophils Absolute: 8.3 10*3/uL — ABNORMAL HIGH (ref 1.4–7.0)
Neutrophils: 66 %
Platelets: 321 10*3/uL (ref 150–450)
RBC: 4.85 x10E6/uL (ref 3.77–5.28)
RDW: 13.5 % (ref 11.7–15.4)
WBC: 12.6 10*3/uL — ABNORMAL HIGH (ref 3.4–10.8)

## 2020-03-26 LAB — TSH: TSH: 0.27 u[IU]/mL — ABNORMAL LOW (ref 0.450–4.500)

## 2020-03-27 ENCOUNTER — Other Ambulatory Visit: Payer: Self-pay | Admitting: Cardiology

## 2020-03-28 ENCOUNTER — Other Ambulatory Visit: Payer: Self-pay | Admitting: Family Medicine

## 2020-03-29 ENCOUNTER — Other Ambulatory Visit: Payer: Self-pay | Admitting: Family

## 2020-03-29 ENCOUNTER — Other Ambulatory Visit: Payer: Self-pay

## 2020-03-29 ENCOUNTER — Ambulatory Visit (INDEPENDENT_AMBULATORY_CARE_PROVIDER_SITE_OTHER): Payer: Medicare Other | Admitting: Cardiovascular Disease

## 2020-03-29 ENCOUNTER — Encounter: Payer: Self-pay | Admitting: Cardiovascular Disease

## 2020-03-29 VITALS — BP 118/72 | HR 84 | Ht 59.0 in | Wt 218.0 lb

## 2020-03-29 DIAGNOSIS — I10 Essential (primary) hypertension: Secondary | ICD-10-CM

## 2020-03-29 DIAGNOSIS — I428 Other cardiomyopathies: Secondary | ICD-10-CM

## 2020-03-29 DIAGNOSIS — E782 Mixed hyperlipidemia: Secondary | ICD-10-CM

## 2020-03-29 MED ORDER — LIVALO 4 MG PO TABS: 1.0000 | ORAL_TABLET | Freq: Every day | ORAL | 0 refills | Status: DC

## 2020-03-29 MED ORDER — LEVOTHYROXINE SODIUM 88 MCG PO TABS: 88.0000 ug | ORAL_TABLET | Freq: Every day | ORAL | 1 refills | Status: DC

## 2020-03-29 NOTE — Assessment & Plan Note (Signed)
History of essential hypertension a blood pressure measured today 118/72.  She is on Entresto.

## 2020-03-29 NOTE — Assessment & Plan Note (Signed)
History of hyperlipidemia on Livalo with lipid profile performed 07/26/2019 related to cholesterol 163, LDL of 86 and HDL 43.

## 2020-03-29 NOTE — Telephone Encounter (Signed)
This is Dr. Berry's pt 

## 2020-03-29 NOTE — Assessment & Plan Note (Signed)
History of nonischemic cardiomyopathy status post right left heart cath by myself 10/02/2019.  She had normal coronary arteries.  She was admitted to the heart failure service, diuresed and placed on appropriate medications.  She ultimately did not tolerate the carvedilol or the digoxin but remains on diuretics and Entresto.  She is aware of salt restriction.  She was placed on palliative care.  She walks with a walker at home and lives alone.

## 2020-03-29 NOTE — Patient Instructions (Signed)

## 2020-03-29 NOTE — Progress Notes (Signed)
03/29/2020 Sheila [name]   07/31/1940  [id number]  Primary Physician Sheila Balloon, FNP Primary Cardiologist: Sheila Harp MD Sheila [name], [place]  HPI:  Sheila [name] is a 80 y.o.   HPI:  Sheila [name] is a 80 y.o.  moderately overweight widowed Caucasian female mother of 2 children, grandmother of 3 grandchildren who is accompanied by both of her daughters, Sheila [name] and Sheila [name] today.  I last saw her in the office 11/06/2019. She is referred by Sheila Dun FNP for evaluation of chest pain and shortness of breath.I last saw her in the office8/06/2019..She does have a history of having worked in a [employer] when she was younger. She is never smoked but does not have a diagnosis of COPD. She has hyperlipidemia on Livalo. She is never had a heart attack or stroke. She complains of episodic shortness of breath, dyspnea on exertion and atypical chest pain.  I performed 2D echocardiography on her on 09/20/2019 revealing EF of 20 to 25% with grade 3 diastolic dysfunction, moderate MR and TR as well as a coronary calcium score performed the same day which was 318 primarily in the LAD territory.  Based on this I elected to proceed with outpatient right left heart cath 10/02/2019 which revealed essentially normal coronary arteries with diagonal branch disease and elevated filling pressures with low cardiac output. I ultimately admitted her under the heart failure service and Dr. Haroldine [name] took care of her. She was discharged home on 812. She was originally placed on IV inotropes and was diuresed 7 pounds. She was discharged home on Entresto and spironolactone as well as digoxin. Her carvedilol was discontinued because of low cardiac output. She did well for a week or so at home but then started to get more lethargic. She apparently had a fall earlier this week semimechanical she has been somewhat disoriented. Her oxygen sats at rest today are 93%. She was diagnosed with a urinary tract  infection and was prescribed Keflex.  3 months with a PA  When I saw her on 10/25/2019 she was somewhat confused.  A digoxin level was obtained which was 1.1 and this was discontinued.  She saw Dr. Haroldine [name] in the heart failure clinic the following day who recommended that she we pursue palliative care which I agree with.  She is minimally ambulatory and for the most part wheelchair-bound with symptoms of chronic systolic heart failure.  Since I saw her in the office 4 months ago she is done better than I expected.  She lives alone but does have home health care and hospice care.  She walks with a walker.  She is moderately dyspneic.  She is aware of salt restriction.   Current Meds  Medication Sig  . albuterol (PROVENTIL) (2.5 MG/3ML) 0.083% nebulizer solution NEBULIZE 1 VIAL EVERY 6-8 HOURS AS NEEDED FOR WHEEZING OR SHORTNESS OF BREATH  . ALPRAZolam (XANAX) 0.5 MG tablet TAKE (1) TABLET TWICE A DAY.  [name] aspirin 81 MG chewable tablet Chew 1 tablet (81 mg total) by mouth daily.  . Cholecalciferol (VITAMIN D3) 25 MCG (1000 UT) CAPS Take 1,000 Units by mouth daily.   . clotrimazole-betamethasone (LOTRISONE) cream APPLY TO AFFECTED AREAS TWICE A DAY  . diclofenac sodium (VOLTAREN) 1 % GEL Apply 2 g topically 4 (four) times daily. (Patient taking differently: Apply 2 g topically 4 (four) times daily as needed (pain).)  . DULoxetine (CYMBALTA) 60 MG capsule TAKE 1 CAPUSLE ONCE DAILY  . ENTRESTO  49-51 MG TAKE 1 TABLET 2 TIMES A DAY  . fluticasone (FLONASE) 50 MCG/ACT nasal spray PLACE 2 SPRAYS INTO BOTH NOSTRILS DAILY  . gabapentin (NEURONTIN) 300 MG capsule TAKE (1) CAPSULE THREE TIMES DAILY.  [name] levocetirizine (XYZAL) 5 MG tablet TAKE 1 TABLET ONCE DAILY IN THE EVENING  . levothyroxine (SYNTHROID) 88 MCG tablet Take 1 tablet (88 mcg total) by mouth daily.  . meclizine (ANTIVERT) 25 MG tablet TAKE (1) TABLET THREE TIMES DAILY AS NEEDED FOR DIZZINESS. (Patient taking differently: Take 25 mg by mouth 3  (three) times daily as needed for dizziness.)  . Multiple Vitamin (MULTIVITAMIN WITH MINERALS) TABS tablet Take 1 tablet by mouth daily.  . Omega-3 Fatty Acids (FISH OIL OMEGA-3 PO) Take 1 capsule by mouth every evening.   . pantoprazole (PROTONIX) 20 MG tablet Take 1 tablet (20 mg total) by mouth at bedtime.  . Pitavastatin Calcium (LIVALO) 4 MG TABS Take 1 tablet (4 mg total) by mouth daily.  . potassium chloride SA (KLOR-CON) 20 MEQ tablet TAKE (1) TABLET TWICE A DAY.  [name] spironolactone (ALDACTONE) 25 MG tablet TAKE 1 TABLET ONCE DAILY  . SYMBICORT 160-4.5 MCG/ACT inhaler INHALE 2 PUFFS TWICE A DAY  . Tetrahydrozoline HCl (VISINE OP) Place 1 drop into both eyes daily as needed (dry eyes/itching).  . torsemide (DEMADEX) 20 MG tablet Take 2 tablets (40 mg total) by mouth daily.  . traMADol (ULTRAM) 50 MG tablet Take 1-2 tablets (50-100 mg total) by mouth every 12 (twelve) hours as needed.  . triamcinolone ointment (KENALOG) 0.5 % APPLY TO AFFECTED AREAS TWICE A DAY     Allergies  Allergen Reactions  . Ibuprofen Other (See Comments)    History of bleeding ulcers - contra-indicated   . Benadryl [Diphenhydramine Hcl] Other (See Comments)    Worsening depression   . Lipitor [Atorvastatin] Other (See Comments)    Body aches.  . Hydrocodone Nausea And Vomiting  . Codeine Nausea And Vomiting  . Morphine And Related Nausea And Vomiting  . Tdap [Tetanus-Diphth-Acell Pertussis] Swelling    Redness and localized swelling at injection site     Social History   Socioeconomic History  . Marital status: Widowed    Spouse name: Not on file  . Number of children: 2  . Years of education: 0  . Highest education level: Never attended school  Occupational History  . Occupation: retired  Tobacco Use  . Smoking status: Former Smoker    Types: Cigarettes    Quit date: 02/24/1968    Years since quitting: 52.1  . Smokeless tobacco: Current User    Types: Snuff  . Tobacco comment: quit yrs and  yrs ago when she was very young  Media planner  . Vaping Use: Never used  Substance and Sexual Activity  . Alcohol use: No  . Drug use: No  . Sexual activity: Not Currently    Birth control/protection: Post-menopausal  Other Topics Concern  . Not on file  Social History Narrative  . Not on file   Social Determinants of Health   Financial Resource Strain: Not on file  Food Insecurity: Not on file  Transportation Needs: Not on file  Physical Activity: Not on file  Stress: Not on file  Social Connections: Not on file  Intimate Partner Violence: Not on file     Review of Systems: General: negative for chills, fever, night sweats or weight changes.  Cardiovascular: negative for chest pain, dyspnea on exertion, edema, orthopnea, palpitations, paroxysmal nocturnal dyspnea  or shortness of breath Dermatological: negative for rash Respiratory: negative for cough or wheezing Urologic: negative for hematuria Abdominal: negative for nausea, vomiting, diarrhea, bright red blood per rectum, melena, or hematemesis Neurologic: negative for visual changes, syncope, or dizziness All other systems reviewed and are otherwise negative except as noted above.    Blood pressure 118/72, [name] 84, height 4\' 11"  (1.499 m), weight 218 lb (98.9 kg).  General appearance: alert and no distress Neck: no adenopathy, no carotid bruit, no JVD, supple, symmetrical, trachea midline and thyroid not enlarged, symmetric, no tenderness/mass/nodules Lungs: clear to auscultation bilaterally Heart: regular rate and rhythm, S1, S2 normal, no murmur, click, rub or gallop Extremities: 1+ edema bilaterally Pulses: 2+ and symmetric Skin: Skin color, texture, turgor normal. No rashes or lesions Neurologic: Alert and oriented X 3, normal strength and tone. Normal symmetric reflexes. Normal coordination and gait  EKG sinus rhythm at 84 with borderline LVH by voltage and poor R wave progression.  I personally reviewed this  EKG.  ASSESSMENT AND PLAN:   Hyperlipidemia History of hyperlipidemia on Livalo with lipid profile performed 07/26/2019 related to cholesterol 163, LDL of 86 and HDL 43.  Essential hypertension, benign History of essential hypertension a blood pressure measured today 118/72.  She is on Entresto.  NICM (nonischemic cardiomyopathy) ([place]) History of nonischemic cardiomyopathy status post right left heart cath by myself 10/02/2019.  She had normal coronary arteries.  She was admitted to the heart failure service, diuresed and placed on appropriate medications.  She ultimately did not tolerate the carvedilol or the digoxin but remains on diuretics and Entresto.  She is aware of salt restriction.  She was placed on palliative care.  She walks with a walker at home and lives alone.      Sheila Harp MD FACP,FACC,FAHA, [facility] 03/29/2020 3:50 PM

## 2020-04-01 ENCOUNTER — Other Ambulatory Visit: Payer: Self-pay

## 2020-04-01 ENCOUNTER — Other Ambulatory Visit: Payer: Medicare Other

## 2020-04-01 ENCOUNTER — Telehealth: Payer: Self-pay

## 2020-04-01 NOTE — Telephone Encounter (Signed)
lmtcb

## 2020-04-02 ENCOUNTER — Other Ambulatory Visit: Payer: Self-pay | Admitting: Family

## 2020-04-02 DIAGNOSIS — E210 Primary hyperparathyroidism: Secondary | ICD-10-CM

## 2020-04-02 LAB — PTH, INTACT AND CALCIUM
Calcium: 10.8 mg/dL — ABNORMAL HIGH (ref 8.7–10.3)
PTH: 116 pg/mL — ABNORMAL HIGH (ref 15–65)

## 2020-04-15 ENCOUNTER — Other Ambulatory Visit: Payer: Self-pay | Admitting: Family

## 2020-04-16 ENCOUNTER — Telehealth: Payer: Self-pay

## 2020-04-16 ENCOUNTER — Ambulatory Visit (INDEPENDENT_AMBULATORY_CARE_PROVIDER_SITE_OTHER): Payer: Medicare Other | Admitting: Nurse Practitioner

## 2020-04-16 ENCOUNTER — Encounter: Payer: Self-pay | Admitting: Nurse Practitioner

## 2020-04-16 ENCOUNTER — Other Ambulatory Visit: Payer: Self-pay

## 2020-04-16 DIAGNOSIS — N183 Chronic kidney disease, stage 3 unspecified: Secondary | ICD-10-CM

## 2020-04-16 NOTE — Patient Instructions (Signed)
Hypercalcemia Hypercalcemia is when the level of calcium in a person's blood is above normal. The body needs calcium to make bones and keep them strong. Calcium also helps the muscles, nerves, brain, and heart work the way they should. Most of the calcium in the body is in the bones. There is also some calcium in the blood. Hypercalcemia can happen when calcium comes out of the bones, or when the kidneys are not able to remove calcium from the blood. Hypercalcemia can be mild or severe. What are the causes? There are many possible causes of hypercalcemia. Common causes of this condition include:  Hyperparathyroidism. This is a condition in which the body produces too much parathyroid hormone. There are four parathyroid glands in your neck. These glands produce a chemical messenger (hormone) that helps the body absorb calcium from foods and helps your bones release calcium.  Certain kinds of cancer. Less common causes of hypercalcemia include:  Getting too much calcium or vitamin D from your diet.  Kidney failure.  Hyperthyroidism.  Severe dehydration.  Being on bed rest or being inactive for a long time.  Certain medicines.  Infections. What increases the risk? You are more likely to develop this condition if you:  Are female.  Are 60 years of age or older.  Have a family history of hypercalcemia. What are the signs or symptoms? Mild hypercalcemia that starts slowly may not cause symptoms. Severe, sudden hypercalcemia is more likely to cause symptoms, such as:  Being more thirsty than usual.  Needing to urinate more often than usual.  Abdominal pain.  Nausea and vomiting.  Constipation.  Muscle pain, twitching, or weakness.  Feeling very tired. How is this diagnosed? Hypercalcemia is usually diagnosed with a blood test. You may also have tests to help determine what is causing this condition, such as imaging tests and more blood tests.   How is this  treated? Treatment for hypercalcemia depends on the cause. Treatment may include:  Receiving fluids through an IV.  Medicines that: ? Keep calcium levels steady after receiving fluids (loop diuretics). ? Keep calcium in your bones (bisphosphonates). ? Lower the calcium level in your blood.  Surgery to remove overactive parathyroid glands.  A procedure that filters your blood to correct calcium levels (hemodialysis). Follow these instructions at home:  Take over-the-counter and prescription medicines only as told by your health care provider.  Follow instructions from your health care provider about eating or drinking restrictions.  Drink enough fluid to keep your urine pale yellow.  Stay active. Weight-bearing exercise helps to keep calcium in your bones. Follow instructions from your health care provider about what type and level of exercise is safe for you.  Keep all follow-up visits as told by your health care provider. This is important.   Contact a health care provider if you have:  A fever.  A heartbeat that is irregular or very fast.  Changes in mood, memory, or personality. Get help right away if you:  Have severe abdominal pain.  Have chest pain.  Have trouble breathing.  Become very confused and sleepy.  Lose consciousness. Summary  Hypercalcemia is when the level of calcium in a person's blood is above normal. The body needs calcium to make bones and keep them strong. Calcium also helps the muscles, nerves, brain, and heart work the way they should.  There are many possible causes of hypercalcemia, and treatment depends on the cause.  Take over-the-counter and prescription medicines only as told by your   health care provider.  Follow instructions from your health care provider about eating or drinking restrictions. This information is not intended to replace advice given to you by your health care provider. Make sure you discuss any questions you have with  your health care provider. Document Revised: 03/08/2018 Document Reviewed: 11/15/2017 Elsevier Patient Education  2021 [employer]  

## 2020-04-16 NOTE — Progress Notes (Signed)
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   Past Medical History:  Diagnosis Date  . Allergy   . Bell's palsy   . Cancer ([place])    skin CA on face  . Cataract   . Chest pain 07/2009   Myoveiw stress test negative.  . Depression   . Diverticulosis of colon   . DJD (degenerative joint disease) of knee   . Dyspnea    on exertion  . Family history of adverse reaction to anesthesia    sister ha nausea/vomiting  . Gastric ulcer with hemorrhage 01/14/2011  . GERD (gastroesophageal reflux disease)   . Headache   . History of fractured pelvis   . Hypercholesteremia   . Hypertension   . Hypothyroidism   . LV dysfunction   . UTI (lower urinary tract infection)     Past Surgical History:  Procedure Laterality Date  . APPENDECTOMY    . BREAST BIOPSY Left   . ESOPHAGOGASTRODUODENOSCOPY  01/14/2011   Procedure: ESOPHAGOGASTRODUODENOSCOPY (EGD);  Surgeon: Rogene Houston, MD;  Location: [facility];  Service: Endoscopy;  Laterality: N/A;  . HIP SURGERY     pelvis  . JOINT REPLACEMENT    . KNEE SURGERY  04/2010.   Total left knee replacement  . LAPAROSCOPIC APPENDECTOMY N/A 08/01/2013   Procedure: APPENDECTOMY LAPAROSCOPIC;  Surgeon: Jamesetta So, MD;  Location: [facility];  Service: General;  Laterality: N/A;  . RIGHT/LEFT HEART CATH AND CORONARY ANGIOGRAPHY N/A 10/02/2019   Procedure: RIGHT/LEFT HEART CATH AND CORONARY ANGIOGRAPHY;  Surgeon: Lorretta Harp, MD;  Location: [facility];  Service: Cardiovascular;  Laterality: N/A;    Social History   Tobacco Use  . Smoking status: Former Smoker    Types: Cigarettes    Quit date: 02/24/1968    Years since quitting: 52.1  . Smokeless tobacco: Current User    Types: Snuff  . Tobacco comment: quit yrs and  yrs ago when she was very young  Media planner  . Vaping Use: Never used  Substance Use Topics  . Alcohol use: No  . Drug use: No    Family History  Problem Relation Age of Onset  . Aneurysm Mother   . Stroke Mother   . Hypertension Mother   . Cancer Father        lung  . Heart disease Sister   . Hip fracture Sister   . Cancer Brother   . Alzheimer's disease Sister   . Cancer Sister        skin, uterine  . Cancer Brother     Outpatient Encounter Medications as of 04/16/2020  Medication Sig  . albuterol (PROVENTIL) (2.5 MG/3ML) 0.083% nebulizer solution NEBULIZE 1 VIAL EVERY 6-8 HOURS AS NEEDED FOR WHEEZING OR SHORTNESS OF BREATH  . ALPRAZolam (XANAX) 0.5 MG tablet TAKE (1) TABLET TWICE A DAY.  [name] Cholecalciferol (VITAMIN D3) 25 MCG (1000 UT) CAPS Take 1,000 Units by mouth daily.   . clotrimazole-betamethasone (LOTRISONE) cream  APPLY TO AFFECTED AREAS TWICE A DAY  . diclofenac sodium (VOLTAREN) 1 % GEL Apply 2 g topically 4 (four) times daily. (Patient taking differently: Apply 2 g topically 4 (four) times daily as needed (pain).)  . DULoxetine (CYMBALTA) 60 MG capsule TAKE 1 CAPUSLE ONCE DAILY  . ENTRESTO 49-51 MG TAKE 1 TABLET 2 TIMES A DAY  . fluticasone (FLONASE) 50 MCG/ACT nasal spray PLACE 2 SPRAYS INTO BOTH NOSTRILS DAILY  . gabapentin (NEURONTIN) 300 MG capsule TAKE (1) CAPSULE THREE TIMES DAILY.  [name] levocetirizine (XYZAL) 5 MG tablet TAKE 1 TABLET ONCE DAILY IN THE EVENING  . levothyroxine (SYNTHROID) 88 MCG tablet Take 1 tablet (88 mcg total) by mouth daily.  . meclizine (ANTIVERT) 25 MG tablet Take 1 tablet (25 mg total) by mouth 3 (three) times daily as needed for dizziness.  . Multiple Vitamin (MULTIVITAMIN WITH MINERALS) TABS tablet Take 1 tablet by mouth daily.  . Omega-3 Fatty Acids (FISH OIL OMEGA-3 PO) Take 1 capsule by mouth every evening.   . pantoprazole (PROTONIX) 20 MG tablet Take 1 tablet (20 mg total) by mouth at bedtime.  . Pitavastatin Calcium (LIVALO) 4  MG TABS Take 1 tablet (4 mg total) by mouth daily.  . potassium chloride SA (KLOR-CON) 20 MEQ tablet TAKE (1) TABLET TWICE A DAY.  [name] spironolactone (ALDACTONE) 25 MG tablet TAKE 1 TABLET ONCE DAILY  . SYMBICORT 160-4.5 MCG/ACT inhaler INHALE 2 PUFFS TWICE A DAY  . Tetrahydrozoline HCl (VISINE OP) Place 1 drop into both eyes daily as needed (dry eyes/itching).  . torsemide (DEMADEX) 20 MG tablet Take 2 tablets (40 mg total) by mouth daily.  . traMADol (ULTRAM) 50 MG tablet Take 1-2 tablets (50-100 mg total) by mouth every 12 (twelve) hours as needed.  . triamcinolone ointment (KENALOG) 0.5 % APPLY TO AFFECTED AREAS TWICE A DAY  . [DISCONTINUED] aspirin 81 MG chewable tablet Chew 1 tablet (81 mg total) by mouth daily.   No facility-administered encounter medications on file as of 04/16/2020.    Allergies  Allergen Reactions  . Ibuprofen Other (See Comments)    History of bleeding ulcers - contra-indicated   . Benadryl [Diphenhydramine Hcl] Other (See Comments)    Worsening depression   . Lipitor [Atorvastatin] Other (See Comments)    Body aches.  . Hydrocodone Nausea And Vomiting  . Codeine Nausea And Vomiting  . Morphine And Related Nausea And Vomiting  . Tdap [Tetanus-Diphth-Acell Pertussis] Swelling    Redness and localized swelling at injection site      HPI  MARCELLA [name] was diagnosed with hypercalcemia in 2014 (according to chart review, this was the first incidence noted).  Patient has no previously known history of parathyroid, pituitary, adrenal dysfunctions; no family history of such dysfunctions. -Review of her referral package of most recent labs reveals calcium of 10.8 the corresponding PTH of 116 on 04/01/20.  I reviewed pt's DEXA scans: 2019 Her T score was -2.2 (considered osteopenic)  No prior history of fragility fractures or falls. No history of kidney stones.  Has History of CKD (stage 3a). Last BUN/Cr: 26/1.01  she is not on HCTZ or other thiazide therapy.   She does have a history of vitamin D deficiency. Last vitamin D level was 20.9 in 2017.  she is not on calcium supplements (is on a MVI for women over 50),  she eats dairy and green, leafy, vegetables on average amounts.  She does drink milk in excess according to her niece who is  with her today (drinks 3 gallons of milk per week).  she does not have a family history of hypercalcemia, pituitary tumors, thyroid cancer, or osteoporosis.   I reviewed her chart and she also has a history of CHF, COPD, PUD, HLD, HTN, and chronic pain.    Review of systems  Constitutional: + Minimally fluctuating body weight,  current Body mass index is 43.3 kg/m. , no fatigue, no subjective hyperthermia, no subjective hypothermia Eyes: no blurry vision, no xerophthalmia ENT: no sore throat, no nodules palpated in throat, no dysphagia/odynophagia, no hoarseness Cardiovascular: no chest pain, + shortness of breath with exertion (history of CHF), no palpitations, no leg swelling Respiratory: no cough, + shortness of breath with exertion Gastrointestinal: no nausea/vomiting/diarrhea Musculoskeletal: no muscle/joint aches, walks with walker due to deconditioning Skin: no rashes, no hyperemia Neurological: + mild resting tremors, no numbness, no tingling, no dizziness Psychiatric: no depression, no anxiety  -------------------------------------------------------------------------------------------------------------------------------- OBJECTIVE:  BP 129/85 (BP Location: Left Arm, Patient Position: Sitting)   Pulse (!) 114   Ht 4\' 11"  (1.499 m)   Wt 214 lb 6.4 oz (97.3 kg)   BMI 43.30 kg/m , Body mass index is 43.3 kg/m. Wt Readings from Last 3 Encounters:  04/16/20 214 lb 6.4 oz (97.3 kg)  03/29/20 218 lb (98.9 kg)  03/25/20 214 lb 3.2 oz (97.2 kg)    BP Readings from Last 3 Encounters:  04/16/20 129/85  03/29/20 118/72  03/25/20 135/90    Constitutional: morbidly obese, not in acute distress, normal  state of mind Eyes: PERRLA, EOMI, no exophthalmos ENT: moist mucous membranes, no gross thyromegaly, no gross cervical lymphadenopathy Cardiovascular: normal precordial activity, Regular Rate and Rhythm, no Murmur/Rubs/Gallops Respiratory:  adequate breathing efforts, no gross chest deformity, Clear to auscultation bilaterally Gastrointestinal: abdomen soft, Non -tender, No distension, Bowel Sounds present Musculoskeletal: no gross deformities, strength intact in all four extremities, walks with walker for deconditioning Skin: moist, warm, no rashes Neurological: + mild resting tremor with outstretched hands, Deep tendon reflexes normal in bilateral lower extremities.     CMP ( most recent) CMP     Component Value Date/Time   NA 137 03/25/2020 1504   K 4.8 03/25/2020 1504   CL 99 03/25/2020 1504   CO2 21 03/25/2020 1504   GLUCOSE 98 03/25/2020 1504   GLUCOSE 129 (H) 10/24/2019 2110   BUN 26 03/25/2020 1504   CREATININE 1.01 (H) 03/25/2020 1504   CREATININE 0.70 07/28/2012 1248   CALCIUM 10.8 (H) 04/01/2020 1340   PROT 7.6 03/25/2020 1504   ALBUMIN 4.9 (H) 03/25/2020 1504   AST 33 03/25/2020 1504   ALT 26 03/25/2020 1504   ALKPHOS 64 03/25/2020 1504   BILITOT 0.7 03/25/2020 1504   GFRNONAA 53 (L) 03/25/2020 1504   GFRNONAA 87 07/28/2012 1248   GFRAA 62 03/25/2020 1504   GFRAA >89 07/28/2012 1248     Diabetic Labs (most recent): Lab Results  Component Value Date   HGBA1C 5.6 12/15/2016   HGBA1C 4.9% 11/15/2012     Lipid Panel ( most recent) Lipid Panel     Component Value Date/Time   CHOL 163 07/26/2019 1454   CHOL 154 07/28/2012 1248   TRIG 200 (H) 07/26/2019 1454   TRIG 142 05/11/2014 1559   TRIG 120 07/28/2012 1248   HDL 43 07/26/2019 1454   HDL 45 05/11/2014 1559   HDL 44 07/28/2012 1248   CHOLHDL 3.8 07/26/2019 1454   LDLCALC 86 07/26/2019 1454   LDLCALC 48 11/15/2012 1255  [place] 86 07/28/2012 1248   LABVLDL 34 07/26/2019 1454      Lab Results   Component Value Date   TSH 0.270 (L) 03/25/2020   TSH 8.280 (H) 07/26/2019   TSH 3.610 10/13/2018   TSH 4.410 05/03/2018   TSH 5.110 (H) 04/05/2018   TSH 2.430 06/08/2017   TSH 1.660 12/15/2016   TSH 0.886 08/13/2016   TSH 1.330 05/11/2016   TSH 0.963 08/19/2015   FREET4 1.01 01/13/2011     -------------------------------------------------------------------------------------------------------------------------------- Assessment/ PLAN: 1. Hypercalcemia / Hyperparathyroidism  Patient has had several instances of elevated calcium, with the highest level being at 11.2 mg/dL. A corresponding intact PTH level was also high, at 116.  - Patient also has vitamin D deficiency, with the last level being 20.9.  - No apparent complications from hypercalcemia/hyperparathyroidism: no history of  nephrolithiasis, osteoporosis, fragility fractures, No abdominal pain, no major mood disorders, no bone pain.  - I discussed with the patient about the physiology of calcium and parathyroid hormone, and possible effects of increased PTH/ Calcium, including kidney stones, cardiac dysrhythmias, osteoporosis, abdominal pain, etc.   - The work up so far is not sufficient to reach a conclusion for definitive therapy.  she  needs more studies to confirm and classify the parathyroid dysfunction she may have. I will proceed to obtain repeat intact PTH/calcium, serum magnesium, serum phosphorus and PTH-rp.  It is also essential to obtain 24-hour urine calcium/creatinine to rule out the rare but important cause of mild elevation in calcium and PTH- [place] (Familial Hypocalciuric Hypercalcemia), which may not require any active intervention.  Given her age and comorbidites, she would not be an ideal surgical candidate, therefore medications to treat hypercalcemia may be the best course of treatment in her case.  - I will request for her next DEXA scan to include the distal 33% of radius for evaluation of cortical bone, which  is predominantly affected by hyperparathyroidism.     - Time spent with the patient: 60 minutes, of which >50% was spent in obtaining information about her symptoms, reviewing her previous labs, evaluations, and treatments, counseling her about her  hypercalcemia , and developing a plan to confirm the diagnosis and long term treatment as necessary.  Please refer to " Patient Self Inventory" in the Media  tab for reviewed elements of pertinent patient history.  [name] participated in the discussions, expressed understanding, and voiced agreement with the above plans.  All questions were answered to her satisfaction. she is encouraged to contact clinic should she have any questions or concerns prior to her return visit.   Follow up Plan: - Return in about 2 weeks (around 04/30/2020) for Previsit labs, 24 hr urine.   Rayetta Pigg, [facility] [address] [place],  [address] Phone: [phone] Fax: [phone]  04/16/2020, 2:46 PM

## 2020-04-17 ENCOUNTER — Other Ambulatory Visit: Payer: Self-pay | Admitting: Nurse Practitioner

## 2020-04-17 DIAGNOSIS — J449 Chronic obstructive pulmonary disease, unspecified: Secondary | ICD-10-CM

## 2020-04-18 ENCOUNTER — Other Ambulatory Visit: Payer: Self-pay

## 2020-04-18 ENCOUNTER — Other Ambulatory Visit: Payer: Medicare Other

## 2020-04-18 DIAGNOSIS — N183 Chronic kidney disease, stage 3 unspecified: Secondary | ICD-10-CM | POA: Insufficient documentation

## 2020-04-18 NOTE — Telephone Encounter (Signed)
This is a chronic condition. Her kidney's are stable.

## 2020-04-23 DIAGNOSIS — M1990 Unspecified osteoarthritis, unspecified site: Secondary | ICD-10-CM | POA: Diagnosis not present

## 2020-04-26 ENCOUNTER — Other Ambulatory Visit: Payer: Self-pay | Admitting: *Deleted

## 2020-04-26 ENCOUNTER — Other Ambulatory Visit: Payer: Self-pay | Admitting: Family

## 2020-04-26 ENCOUNTER — Other Ambulatory Visit: Payer: Self-pay | Admitting: Internal Medicine

## 2020-04-26 ENCOUNTER — Other Ambulatory Visit: Payer: Self-pay | Admitting: Cardiology

## 2020-04-26 LAB — MAGNESIUM: Magnesium: 2 mg/dL (ref 1.6–2.3)

## 2020-04-26 LAB — CALCIUM, URINE, 24 HOUR
Calcium, 24H Urine: 91 mg/24 hr (ref 0–320)
Calcium, Urine: 5.7 mg/dL

## 2020-04-26 LAB — CREATININE, URINE, 24 HOUR
Creatinine, 24H Ur: 867 mg/24 hr (ref 800–1800)
Creatinine, Urine: 54.2 mg/dL

## 2020-04-26 LAB — PTH, INTACT AND CALCIUM
Calcium: 11 mg/dL — ABNORMAL HIGH (ref 8.7–10.3)
PTH: 166 pg/mL — ABNORMAL HIGH (ref 15–65)

## 2020-04-26 LAB — PTH-RELATED PEPTIDE: PTH-related peptide: <2 pmol/L

## 2020-04-26 LAB — PHOSPHORUS: Phosphorus: 3.7 mg/dL (ref 3.0–4.3)

## 2020-04-26 MED ORDER — LIVALO 4 MG PO TABS: 1.0000 | ORAL_TABLET | Freq: Every day | ORAL | 1 refills | Status: DC

## 2020-04-29 ENCOUNTER — Other Ambulatory Visit: Payer: Self-pay

## 2020-04-29 ENCOUNTER — Other Ambulatory Visit: Payer: Medicare Other | Admitting: Nurse Practitioner

## 2020-04-29 DIAGNOSIS — Z515 Encounter for palliative care: Secondary | ICD-10-CM

## 2020-04-29 DIAGNOSIS — R062 Wheezing: Secondary | ICD-10-CM | POA: Diagnosis not present

## 2020-04-29 DIAGNOSIS — T7840XA Allergy, unspecified, initial encounter: Secondary | ICD-10-CM | POA: Diagnosis not present

## 2020-04-29 NOTE — Progress Notes (Addendum)
Addendum added to update to appropriate CPT code.  [facility] Consult Note Telephone: [phone]  Fax: [phone]  PATIENT NAME: Sheila [name] [address] [place] [address] [phone] (home)  DOB: [birth year] MRN: [id number]  PRIMARY CARE PROVIDER:    Sharion Balloon, [place],  [address] [place] [address] [phone]  REFERRING PROVIDER:   Sharion Balloon, [place],  [place] [address] [phone]  RESPONSIBLE PARTY:   Extended Emergency Contact Information Primary Emergency Contact: [place], [place] Phone: [phone] Mobile Phone: [phone] Relation: Niece Secondary Emergency Contact: Josue [place] Phone: [phone] Mobile Phone: [phone] Relation: Daughter  I met face to face with patient in home.  ASSESSMENT AND RECOMMENDATIONS:   Advance Care Planning: Goal of care: Goal of care is comfort while preserving function. Directives: Signed DNR and MOST form in home, copy on [facility] EMR. MOST form details include, limited additional interventions, determine use or limitation of antibiotics when infection occurs, IV fluids if indicated, feeding tube long term if indicated. Patient reiterated desire to not be resuscitated in the event of cardiac or respiratory arrest. Provided general support and encouragement.  I spent 17 minutes providing this consultation. More than 50% of the time in this consultation was spent counseling and coordinating communication.  -----------------------------------------------------------------------------------------------  Symptom Management:  Seasonal Allergy: patient report running nose, itchy eyes, and occasional headache. Symptoms started in the last week. Patient endorsed hx of seasonal allergy. Report taking Fexofenadine with improvement in symptoms in the last 24hrs. Of note patient was not  vaccinated against Covid-19. Recommend taking a Covid test. Patient advised to notify her PCP if no improvement in symptoms or if symptoms persist. Patient verbalized understanding. Wheezing: Wheezing ausculted in bilateral lungs, patient uses Albuterol Neb twice a day, denied increased SOB. Patient advised increase the frequency Albuterol Neb to three times a day if needed. Hypercalcemia:  Ca 11.0, Phos 3.7, Mg 2.0 on 04/18/2020. PTH 166 up from 116 on 04/01/2020. No report of increased thirst, loss of appetite, abdominal pain, nausea, bone pain, muscle weakness or confusion. Follow up with Endocrinology as scheduled.  Follow up Palliative Care Visit: Palliative care will continue to follow for complex decision making and symptom management. Return in about 4 weeks or prn.  Family /Caregiver/Community Supports: Patient lives at home , has pain care givers home between 11:15am-3:15pm and  3:15am -7:15pm Monday thru Friday. Caregiver [name] present at visit today.   Cognitive / Functional decline: Patient awake, alert and coherent, dependent on person for bathing and dressing, ambulates with a Rolator walker. No report of recent fall.  CHIEF COMPLAINT: eye itching and running nose  History obtained from review of EMR and discussion with patient and her caregiver. Records reviewed and summarized bellow.  HISTORY OF PRESENT ILLNESS:Sheila [name] a 80 y.o.year old femalewith multiple medical problems includingheart failure (EF 20-20%),COPD, anxiety (on xanax), Chronic pain, DJD.Patient with recently noted with high calcium level, she is followed by Endocrinology. Palliative Care is following patient to help address advance care planning, symptoms management, and complex decision making.This is a follow up visit from01/26/2021.  CODE STATUS: DNR  PPS: 50%  HOSPICE ELIGIBILITY/DIAGNOSIS: TBD  ROS   Constitutional: denies fever, denies chills EYES: denies acute vision changes ENMT:  denies dysphagia Cardiovascular: denies chest pain, denies palpitation Pulmonary: denies cough, denies increased SOB Abdomen: endorses fair appetite, denies constipation, denies incontinence of bowel GU: denies dysuria,  denies incontinence of urine MSK:  endorses ROM limitations, no falls reported Skin: denies rashes or wounds Neurological: endorses weakness, denies uncontrolled pain, denies insomnia Psych: Endorses positive mood Heme/lymph/immuno: denies bruises, abnormal bleeding   Physical Exam: Vital Signs: BP 148/82, P 96 , RR 18, 96% on room air Current and past weights: 214lbs, Ht 11f 11", BMI 43.26kg/m2 General: frail appearing, morbidly obese, sitting recliner in NAD EYES: anicteric sclera, lids intact, no discharge  ENMT: intact hearing, oral mucous membranes moist CV:  no LE edema Pulmonary: no increased work of breathing, no cough, wheezing, room air Abdomen:  no ascites GU: deferred MSK: no contractures of LE, ambulatory Skin: warm and dry, no rashes or wounds on visible skin Neuro: Generalized weakness, A and O x 3 Psych: non-anxious affect today Hem/lymph/immuno: no widespread bruising   PAST MEDICAL HISTORY:  Past Medical History:  Diagnosis Date  . Allergy   . Bell's palsy   . 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 Cataract   . Chest pain 07/2009   Myoveiw stress test negative.  . Depression   . Diverticulosis of colon   . DJD (degenerative joint disease) of knee   . Dyspnea    on exertion  . Family history of adverse reaction to anesthesia    sister ha nausea/vomiting  . Gastric ulcer with hemorrhage 01/14/2011  . GERD (gastroesophageal reflux disease)   . Headache   . History of fractured pelvis   . Hypercholesteremia   . Hypertension   . Hypothyroidism   . LV dysfunction   . UTI (lower urinary tract infection)     SOCIAL HX:  Social History   Tobacco Use  . Smoking status: Former Smoker    Types: Cigarettes    Quit date: 02/24/1968    Years  since quitting: 52.2  . Smokeless tobacco: Current User    Types: Snuff  . Tobacco comment: quit yrs and yrs ago when she was very young  Substance Use Topics  . Alcohol use: No   FAMILY HX:  Family History  Problem Relation Age of Onset  . Aneurysm Mother   . Stroke Mother   . Hypertension Mother   . Cancer Father        lung  . Heart disease Sister   . Hip fracture Sister   . Cancer Brother   . Alzheimer's disease Sister   . Cancer Sister        skin, uterine  . Cancer Brother     ALLERGIES:  Allergies  Allergen Reactions  . Ibuprofen Other (See Comments)    History of bleeding ulcers - contra-indicated   . Benadryl [Diphenhydramine Hcl] Other (See Comments)    Worsening depression   . Lipitor [Atorvastatin] Other (See Comments)    Body aches.  . Hydrocodone Nausea And Vomiting  . Codeine Nausea And Vomiting  . Morphine And Related Nausea And Vomiting  . Tdap [Tetanus-Diphth-Acell Pertussis] Swelling    Redness and localized swelling at injection site      PERTINENT MEDICATIONS:  Outpatient Encounter Medications as of 04/29/2020  Medication Sig  . albuterol (PROVENTIL) (2.5 MG/3ML) 0.083% nebulizer solution NEBULIZE 1 VIAL EVERY 6-8 HOURS AS NEEDED FOR WHEEZING OR SHORTNESS OF BREATH  . ALPRAZolam (XANAX) 0.5 MG tablet TAKE (1) TABLET TWICE A DAY.  [name] Cholecalciferol (VITAMIN D3) 25 MCG (1000 UT) CAPS Take 1,000 Units by mouth daily.   . clotrimazole-betamethasone (LOTRISONE) cream APPLY TO AFFECTED AREAS TWICE A DAY  .  diclofenac sodium (VOLTAREN) 1 % GEL Apply 2 g topically 4 (four) times daily. (Patient taking differently: Apply 2 g topically 4 (four) times daily as needed (pain).)  . DULoxetine (CYMBALTA) 60 MG capsule TAKE 1 CAPUSLE ONCE DAILY  . ENTRESTO 49-51 MG TAKE 1 TABLET 2 TIMES A DAY  . fluticasone (FLONASE) 50 MCG/ACT nasal spray PLACE 2 SPRAYS INTO BOTH NOSTRILS DAILY  . gabapentin (NEURONTIN) 300 MG capsule TAKE (1) CAPSULE THREE TIMES DAILY.  [name]  levocetirizine (XYZAL) 5 MG tablet TAKE 1 TABLET ONCE DAILY IN THE EVENING  . levothyroxine (SYNTHROID) 88 MCG tablet Take 1 tablet (88 mcg total) by mouth daily.  . meclizine (ANTIVERT) 25 MG tablet Take 1 tablet (25 mg total) by mouth 3 (three) times daily as needed for dizziness.  . Multiple Vitamin (MULTIVITAMIN WITH MINERALS) TABS tablet Take 1 tablet by mouth daily.  . Omega-3 Fatty Acids (FISH OIL OMEGA-3 PO) Take 1 capsule by mouth every evening.   . pantoprazole (PROTONIX) 20 MG tablet Take 1 tablet (20 mg total) by mouth at bedtime.  . Pitavastatin Calcium (LIVALO) 4 MG TABS Take 1 tablet (4 mg total) by mouth daily.  . potassium chloride SA (KLOR-CON) 20 MEQ tablet TAKE (1) TABLET TWICE A DAY.  [name] spironolactone (ALDACTONE) 25 MG tablet TAKE 1 TABLET ONCE DAILY  . SYMBICORT 160-4.5 MCG/ACT inhaler INHALE 2 PUFFS TWICE A DAY  . Tetrahydrozoline HCl (VISINE OP) Place 1 drop into both eyes daily as needed (dry eyes/itching).  . torsemide (DEMADEX) 20 MG tablet Take 2 tablets (40 mg total) by mouth daily.  . traMADol (ULTRAM) 50 MG tablet Take 1-2 tablets (50-100 mg total) by mouth every 12 (twelve) hours as needed.  . triamcinolone ointment (KENALOG) 0.5 % APPLY TO AFFECTED AREAS TWICE A DAY   No facility-administered encounter medications on file as of 04/29/2020.    Thank you for the opportunity to participate in the care Sheila [name]. The palliative care team will continue to follow. Please call our office at [phone] if we can be of additional assistance.   Jari Favre, DNP, AGPCNP-BC

## 2020-05-02 ENCOUNTER — Ambulatory Visit (INDEPENDENT_AMBULATORY_CARE_PROVIDER_SITE_OTHER): Payer: Medicare Other | Admitting: Nurse Practitioner

## 2020-05-02 ENCOUNTER — Encounter: Payer: Self-pay | Admitting: Nurse Practitioner

## 2020-05-02 ENCOUNTER — Other Ambulatory Visit: Payer: Self-pay

## 2020-05-02 MED ORDER — CINACALCET HCL 30 MG PO TABS: 30.0000 mg | ORAL_TABLET | ORAL | 2 refills | Status: DC

## 2020-05-02 NOTE — Progress Notes (Signed)
Endocrinology Follow Up Note       05/02/2020, 2:59 PM  SUBJECTIVE:  Sheila [name] is a 80 y.o.-year-old female, referred by her  Sharion Balloon, FNP  , for evaluation for hypercalcemia/hyperparathyroidism.   Past Medical History:  Diagnosis Date  . Allergy   . Bell's palsy   . Cancer ([place])    skin CA on face  . Cataract   . Chest pain 07/2009   Myoveiw stress test negative.  . Depression   . Diverticulosis of colon   . DJD (degenerative joint disease) of knee   . Dyspnea    on exertion  . Family history of adverse reaction to anesthesia    sister ha nausea/vomiting  . Gastric ulcer with hemorrhage 01/14/2011  . GERD (gastroesophageal reflux disease)   . Headache   . History of fractured pelvis   . Hypercholesteremia   . Hypertension   . Hypothyroidism   . LV dysfunction   . UTI (lower urinary tract infection)     Past Surgical History:  Procedure Laterality Date  . APPENDECTOMY    . BREAST BIOPSY Left   . ESOPHAGOGASTRODUODENOSCOPY  01/14/2011   Procedure: ESOPHAGOGASTRODUODENOSCOPY (EGD);  Surgeon: Rogene Houston, MD;  Location: [facility];  Service: Endoscopy;  Laterality: N/A;  . HIP SURGERY     pelvis  . JOINT REPLACEMENT    . KNEE SURGERY  04/2010.   Total left knee replacement  . LAPAROSCOPIC APPENDECTOMY N/A 08/01/2013   Procedure: APPENDECTOMY LAPAROSCOPIC;  Surgeon: Jamesetta So, MD;  Location: [facility];  Service: General;  Laterality: N/A;  . RIGHT/LEFT HEART CATH AND CORONARY ANGIOGRAPHY N/A 10/02/2019   Procedure: RIGHT/LEFT HEART CATH AND CORONARY ANGIOGRAPHY;  Surgeon: Lorretta Harp, MD;  Location: [place] [facility];  Service: Cardiovascular;  Laterality: N/A;    Social History   Tobacco Use  . Smoking status: Former Smoker    Types: Cigarettes    Quit date: 02/24/1968    Years since quitting: 52.2  . Smokeless tobacco: Current User    Types: Snuff  . Tobacco comment: quit yrs and  yrs ago when she was very young  Media pl[employer]  . Vaping Use: Never used  Substance Use Topics  . Alcohol use: No  . Drug use: No    Family History  Problem Relation Age of Onset  . Aneurysm Mother   . Stroke Mother   . Hypertension Mother   . Cancer Father        lung  . Heart disease Sister   . Hip fracture Sister   . Cancer Brother   . Alzheimer's disease Sister   . Cancer Sister        skin, uterine  . Cancer Brother     Outpatient Encounter Medications as of 05/02/2020  Medication Sig  . albuterol (PROVENTIL) (2.5 MG/3ML) 0.083% nebulizer solution NEBULIZE 1 VIAL EVERY 6-8 HOURS AS NEEDED FOR WHEEZING OR SHORTNESS OF BREATH  . ALPRAZolam (XANAX) 0.5 MG tablet TAKE (1) TABLET TWICE A DAY.  [name] Cholecalciferol (VITAMIN D3) 25 MCG (1000 UT) CAPS Take 1,000 Units by mouth daily.   . cinacalcet (SENSIPAR)  30 MG tablet Take 1 tablet (30 mg total) by mouth every other day.  . clotrimazole-betamethasone (LOTRISONE) cream APPLY TO AFFECTED AREAS TWICE A DAY  . diclofenac sodium (VOLTAREN) 1 % GEL Apply 2 g topically 4 (four) times daily. (Patient taking differently: Apply 2 g topically 4 (four) times daily as needed (pain).)  . DULoxetine (CYMBALTA) 60 MG capsule TAKE 1 CAPUSLE ONCE DAILY  . ENTRESTO 49-51 MG TAKE 1 TABLET 2 TIMES A DAY  . fluticasone (FLONASE) 50 MCG/ACT nasal spray PLACE 2 SPRAYS INTO BOTH NOSTRILS DAILY  . gabapentin (NEURONTIN) 300 MG capsule TAKE (1) CAPSULE THREE TIMES DAILY.  [name] levocetirizine (XYZAL) 5 MG tablet TAKE 1 TABLET ONCE DAILY IN THE EVENING  . levothyroxine (SYNTHROID) 88 MCG tablet Take 1 tablet (88 mcg total) by mouth daily.  . meclizine (ANTIVERT) 25 MG tablet Take 1 tablet (25 mg total) by mouth 3 (three) times daily as needed for dizziness.  . Multiple Vitamin (MULTIVITAMIN WITH MINERALS) TABS tablet Take 1 tablet by mouth daily.  . Omega-3 Fatty Acids (FISH OIL OMEGA-3 PO) Take 1 capsule by mouth every evening.   . pantoprazole (PROTONIX) 20 MG  tablet Take 1 tablet (20 mg total) by mouth at bedtime.  . Pitavastatin Calcium (LIVALO) 4 MG TABS Take 1 tablet (4 mg total) by mouth daily.  . potassium chloride SA (KLOR-CON) 20 MEQ tablet TAKE (1) TABLET TWICE A DAY.  [name] spironolactone (ALDACTONE) 25 MG tablet TAKE 1 TABLET ONCE DAILY  . SYMBICORT 160-4.5 MCG/ACT inhaler INHALE 2 PUFFS TWICE A DAY  . Tetrahydrozoline HCl (VISINE OP) Place 1 drop into both eyes daily as needed (dry eyes/itching).  . torsemide (DEMADEX) 20 MG tablet Take 2 tablets (40 mg total) by mouth daily.  . traMADol (ULTRAM) 50 MG tablet Take 1-2 tablets (50-100 mg total) by mouth every 12 (twelve) hours as needed.  . triamcinolone ointment (KENALOG) 0.5 % APPLY TO AFFECTED AREAS TWICE A DAY   No facility-administered encounter medications on file as of 05/02/2020.    Allergies  Allergen Reactions  . Ibuprofen Other (See Comments)    History of bleeding ulcers - contra-indicated   . Benadryl [Diphenhydramine Hcl] Other (See Comments)    Worsening depression   . Lipitor [Atorvastatin] Other (See Comments)    Body aches.  . Hydrocodone Nausea And Vomiting  . Codeine Nausea And Vomiting  . Morphine And Related Nausea And Vomiting  . Tdap [Tetanus-Diphth-Acell Pertussis] Swelling    Redness and localized swelling at injection site      HPI  Sheila [name] was diagnosed with hypercalcemia in 2014 (according to chart review, this was the first incidence noted).  Patient has no previously known history of parathyroid, pituitary, adrenal dysfunctions; no family history of such dysfunctions. -Review of her referral package of most recent labs reveals calcium of 10.8 the corresponding PTH of 116 on 04/01/20.  I reviewed pt's DEXA scans: 2019 Her T score was -2.2 (considered osteopenic)  No prior history of fragility fractures or falls. No history of kidney stones.  Has History of CKD (stage 3a). Last BUN/Cr: 26/1.01  she is not on HCTZ or other thiazide therapy.   She does have a history of vitamin D deficiency. Last vitamin D level was 20.9 in 2017.  she is not on calcium supplements (is on a MVI for women over 50),  she eats dairy and green, leafy, vegetables on average amounts.  She does drink milk in excess according to her niece  who is with her today (drinks 3 gallons of milk per week).  she does not have a family history of hypercalcemia, pituitary tumors, thyroid cancer, or osteoporosis.   I reviewed her chart and she also has a history of CHF, COPD, PUD, HLD, HTN, and chronic pain.  Recently, palliative care has gotten involved with her care.    Review of systems  Constitutional: + Minimally fluctuating body weight,  current Body mass index is 44.84 kg/m. , no fatigue, no subjective hyperthermia, no subjective hypothermia Eyes: no blurry vision, no xerophthalmia ENT: no sore throat, no nodules palpated in throat, no dysphagia/odynophagia, no hoarseness Cardiovascular: no chest pain, + shortness of breath with exertion (history of CHF), no palpitations, no leg swelling Respiratory: no cough, + shortness of breath with exertion Gastrointestinal: no nausea/vomiting/diarrhea Musculoskeletal: no muscle/joint aches, walks with walker due to deconditioning Skin: no rashes, no hyperemia Neurological: + mild resting tremors, no numbness, no tingling, no dizziness Psychiatric: no depression, no anxiety  -------------------------------------------------------------------------------------------------------------------------------- OBJECTIVE:  BP (!) 147/85 (BP Location: Left Arm, Patient Position: Sitting)   Pulse 88   Ht 4\' 11"  (1.499 m)   Wt 222 lb (100.7 kg)   BMI 44.84 kg/m , Body mass index is 44.84 kg/m. Wt Readings from Last 3 Encounters:  05/02/20 222 lb (100.7 kg)  04/16/20 214 lb 6.4 oz (97.3 kg)  03/29/20 218 lb (98.9 kg)    BP Readings from Last 3 Encounters:  05/02/20 (!) 147/85  04/16/20 129/85  03/29/20 118/72     Constitutional: morbidly obese, not in acute distress, normal state of mind Eyes: PERRLA, EOMI, no exophthalmos ENT: moist mucous membranes, no gross thyromegaly, no gross cervical lymphadenopathy Cardiovascular: normal precordial activity, Regular Rate and Rhythm, no Murmur/Rubs/Gallops Respiratory:  adequate breathing efforts, no gross chest deformity, Clear to auscultation bilaterally Gastrointestinal: abdomen soft, Non -tender, No distension, Bowel Sounds present Musculoskeletal: no gross deformities, strength intact in all four extremities, walks with walker for deconditioning Skin: moist, warm, no rashes Neurological: + mild resting tremor with outstretched hands, Deep tendon reflexes normal in bilateral lower extremities.     CMP ( most recent) CMP     Component Value Date/Time   NA 137 03/25/2020 1504   K 4.8 03/25/2020 1504   CL 99 03/25/2020 1504   CO2 21 03/25/2020 1504   GLUCOSE 98 03/25/2020 1504   GLUCOSE 129 (H) 10/24/2019 2110   BUN 26 03/25/2020 1504   CREATININE 1.01 (H) 03/25/2020 1504   CREATININE 0.70 07/28/2012 1248   CALCIUM 11.0 (H) 04/18/2020 1558   PROT 7.6 03/25/2020 1504   ALBUMIN 4.9 (H) 03/25/2020 1504   AST 33 03/25/2020 1504   ALT 26 03/25/2020 1504   ALKPHOS 64 03/25/2020 1504   BILITOT 0.7 03/25/2020 1504   GFRNONAA 53 (L) 03/25/2020 1504   GFRNONAA 87 07/28/2012 1248   GFRAA 62 03/25/2020 1504   GFRAA >89 07/28/2012 1248     Diabetic Labs (most recent): Lab Results  Component Value Date   HGBA1C 5.6 12/15/2016   HGBA1C 4.9% 11/15/2012     Lipid Panel ( most recent) Lipid Panel     Component Value Date/Time   CHOL 163 07/26/2019 1454   CHOL 154 07/28/2012 1248   TRIG 200 (H) 07/26/2019 1454   TRIG 142 05/11/2014 1559   TRIG 120 07/28/2012 1248   HDL 43 07/26/2019 1454   HDL 45 05/11/2014 1559   HDL 44 07/28/2012 1248   CHOLHDL 3.8 07/26/2019 1454   LDLCALC 86  07/26/2019 1454   LDLCALC 48 11/15/2012 1255   LDLCALC 86  07/28/2012 1248   LABVLDL 34 07/26/2019 1454      Lab Results  Component Value Date   TSH 0.270 (L) 03/25/2020   TSH 8.280 (H) 07/26/2019   TSH 3.610 10/13/2018   TSH 4.410 05/03/2018   TSH 5.110 (H) 04/05/2018   TSH 2.430 06/08/2017   TSH 1.660 12/15/2016   TSH 0.886 08/13/2016   TSH 1.330 05/11/2016   TSH 0.963 08/19/2015   FREET4 1.01 01/13/2011     Results for BRYNNAN, RODENBAUGH (MRN [id number]) as of 05/02/2020 14:48  Ref. Range 04/18/2020 15:58  PTH, Intact Latest Ref Range: 15 - 65 pg/mL 166 (H)  PTH-related peptide Latest Units: pmol/L <2.0  PTH Interp Unknown Comment  Creatinine, Urine Latest Ref Range: Not Estab. mg/dL 54.2  Calcium, Urine Latest Ref Range: Not Estab. mg/dL 5.7  Calcium, 24H Urine Latest Ref Range: 0 - 320 mg/24 hr 91  Creatinine, 24H Ur Latest Ref Range: 800 - 1,800 mg/24 hr 867    Results for MARAYA, GWILLIAM (MRN [id number]) as of 05/02/2020 14:48  Ref. Range 04/18/2020 15:58  Calcium Latest Ref Range: 8.7 - 10.3 mg/dL 11.0 (H)  Phosphorus Latest Ref Range: 3.0 - 4.3 mg/dL 3.7  Magnesium Latest Ref Range: 1.6 - 2.3 mg/dL 2.0    -------------------------------------------------------------------------------------------------------------------------------- Assessment/ PLAN:  1. Hypercalcemia / Hyperparathyroidism  Patient has had several instances of elevated calcium, with the highest level being at 11.2 mg/dL. A corresponding intact PTH level was also high, at 116.  - Patient also has vitamin D deficiency, with the last level being 20.9.  - No apparent complications from hypercalcemia/hyperparathyroidism: no history of  nephrolithiasis, osteoporosis, fragility fractures, No abdominal pain, no major mood disorders, no bone pain.  - I, again, discussed with the patient and her niece about the physiology of calcium and parathyroid hormone, and possible effects of increased PTH/ Calcium, including kidney stones, cardiac dysrhythmias, osteoporosis, abdominal  pain, etc.   - Her recent labs show slightly worse Ca level of 11.0 and corresponding PTH of 166.  Her PTH-rp was negative (ruling out possible malignancy as cause).  Her 24-hr urine studies were normal, ruling out [place]. Given her age and comorbidites, she would not be an ideal surgical candidate for parathyroidectomy, therefore medications to treat hypercalcemia may be the best course of treatment in her case.  -I have discussed and initiated Sensipar 30 mg po every other day.  Will recheck PTH and Ca prior to next visit in 3 months to assess response to treatment.       - Time spent on this patient care encounter:  30 minutes of which 50% was spent in  counseling and the rest reviewing  her current and  previous labs / studies and medications  doses and developing a plan for long term care, and documenting this care. [name]  participated in the discussions, expressed understanding, and voiced agreement with the above plans.  All questions were answered to her satisfaction. she is encouraged to contact clinic should she have any questions or concerns prior to her return visit.   Follow up Plan: - Return in about 3 months (around 08/02/2020) for Previsit labs, Virtual visit ok.   Rayetta Pigg, [facility] [address] [place], [place] [address] Phone: [phone] Fax: [phone]  05/02/2020, 2:59 PM

## 2020-05-02 NOTE — Patient Instructions (Signed)
Cinacalcet tablets What is this medicine? CINACALCET (sin a CAL set) is used to treat patients with chronic kidney disease who are on dialysis. It is also used to treat high levels of calcium in the blood of patients with certain parathyroid gland problems. This medicine may be used for other purposes; ask your health care provider or pharmacist if you have questions. COMMON BRAND NAME(S): Sensipar What should I tell my health care provider before I take this medicine? They need to know if you have any of these conditions:  heart disease  history of irregular heartbeat  liver disease  low levels of calcium in the blood  seizures  stomach or intestine problems  an unusual or allergic reaction to cinacalcet, other medicines, foods, dyes, or preservatives  pregnant or trying to get pregnant  breast-feeding How should I use this medicine? Take this medicine by mouth with a glass of water. Follow the directions on the prescription label. Do not break, chew, or crush tablets. Take this medicine with food or right after a meal. Take your medicine at regular intervals. Do not take your medicine more often than directed. Do not stop taking except on your doctor's advice. Talk to your pediatrician regarding the use of this medicine in children. Special care may be needed. Overdosage: If you think you have taken too much of this medicine contact a poison control center or emergency room at once. NOTE: This medicine is only for you. Do not share this medicine with others. What if I miss a dose? If you miss a dose, take it as soon as you can. If it is almost time for your next dose, take only that dose. Do not take double or extra doses. What may interact with this medicine? Do not take this medicine with any of the following medications:  thioridazine This medicine may also interact with the following medications:  erythromycin  flecainide  grapefruit juice  medicines for depression,  anxiety, or psychotic disturbances  medicines for fungal infections like fluconazole, itraconazole, ketoconazole, voriconazole  medicines for pain  medicines for sleep  St. John's wort  vinblastine This list may not describe all possible interactions. Give your health care provider a list of all the medicines, herbs, non-prescription drugs, or dietary supplements you use. Also tell them if you smoke, drink alcohol, or use illegal drugs. Some items may interact with your medicine. What should I watch for while using this medicine? Visit your doctor for regular checks on your progress. Your doctor will order important blood tests while you are taking this medicine. This medicine may cause an increase or decrease in your blood pressure. Check your blood pressure as directed. Ask your doctor or health care professional what your blood pressure should be and when you should contact him or her. This medicine alters the calcium level in your blood. You may need to be on a special diet. Talk to your health care provider about the foods you eat and the vitamins you take. What side effects may I notice from receiving this medicine? Side effects that you should report to your doctor or health care professional as soon as possible:  allergic reactions like skin rash, itching or hives, swelling of the face, lips, or tongue  depressed mood  signs and symptoms of a dangerous change in heartbeat or heart rhythm like chest pain; dizziness; fast or irregular heartbeat; palpitations; feeling faint or lightheaded, falls; breathing problems  signs and symptoms of bleeding such as bloody or black, tarry  stools; spitting up blood or brown material that looks like coffee grounds  signs and symptoms of low blood pressure like dizziness; feeling faint or lightheaded, falls; unusually weak or tired  signs and symptoms of low calcium like fast heartbeat, muscle cramps or muscle pain; pain, tingling, numbness in the  hands or feet; seizures Side effects that usually do not require medical attention (report to your doctor or health care professional if they continue or are bothersome):  bone pain  diarrhea  joint pain  loss of appetite  nausea, vomiting This list may not describe all possible side effects. Call your doctor for medical advice about side effects. You may report side effects to FDA at 1-800-FDA-1088. Where should I keep my medicine? Keep out of the reach of children. Store at room temperature between 15 and 30 degrees C (59 and 86 degrees F). Throw away any unused medicine after the expiration date. NOTE: This sheet is a summary. It may not cover all possible information. If you have questions about this medicine, talk to your doctor, pharmacist, or health care provider.  2021 Elsevier/Gold Standard (2016-02-13 14:28:02)

## 2020-05-07 ENCOUNTER — Ambulatory Visit: Payer: Medicare Other | Admitting: Cardiovascular Disease

## 2020-05-15 ENCOUNTER — Other Ambulatory Visit: Payer: Self-pay | Admitting: Family

## 2020-05-16 ENCOUNTER — Ambulatory Visit (INDEPENDENT_AMBULATORY_CARE_PROVIDER_SITE_OTHER): Payer: Medicare Other | Admitting: Family

## 2020-05-16 ENCOUNTER — Encounter: Payer: Self-pay | Admitting: Family

## 2020-05-16 DIAGNOSIS — E782 Mixed hyperlipidemia: Secondary | ICD-10-CM

## 2020-05-16 DIAGNOSIS — J441 Chronic obstructive pulmonary disease with (acute) exacerbation: Secondary | ICD-10-CM | POA: Diagnosis not present

## 2020-05-16 DIAGNOSIS — F1320 Sedative, hypnotic or anxiolytic dependence, uncomplicated: Secondary | ICD-10-CM

## 2020-05-16 DIAGNOSIS — Z0289 Encounter for other administrative examinations: Secondary | ICD-10-CM | POA: Diagnosis not present

## 2020-05-16 DIAGNOSIS — F411 Generalized anxiety disorder: Secondary | ICD-10-CM

## 2020-05-16 DIAGNOSIS — M545 Low back pain, unspecified: Secondary | ICD-10-CM

## 2020-05-16 DIAGNOSIS — E039 Hypothyroidism, unspecified: Secondary | ICD-10-CM

## 2020-05-16 DIAGNOSIS — F331 Major depressive disorder, recurrent, moderate: Secondary | ICD-10-CM

## 2020-05-16 DIAGNOSIS — Z79899 Other long term (current) drug therapy: Secondary | ICD-10-CM

## 2020-05-16 DIAGNOSIS — E210 Primary hyperparathyroidism: Secondary | ICD-10-CM | POA: Diagnosis not present

## 2020-05-16 DIAGNOSIS — G8929 Other chronic pain: Secondary | ICD-10-CM

## 2020-05-16 DIAGNOSIS — N183 Chronic kidney disease, stage 3 unspecified: Secondary | ICD-10-CM

## 2020-05-16 MED ORDER — ALPRAZOLAM 0.5 MG PO TABS: ORAL_TABLET | ORAL | 2 refills | Status: DC

## 2020-05-16 NOTE — Progress Notes (Signed)
Virtual Visit via telephone Note Due to COVID-19 pandemic this visit was conducted virtually. This visit type was conducted due to national recommendations for restrictions regarding the COVID-19 Pandemic (e.g. social distancing, sheltering in place) in an effort to limit this patient's exposure and mitigate transmission in our community. All issues noted in this document were discussed and addressed.  A physical exam was not performed with this format.  I connected with Sheila [name] on 05/16/20 at 2:45 pm  by telephone and verified that I am speaking with the correct person using two identifiers. Sheila [name] is currently located at home and aid is currently with her during visit. The provider, [name], FNP is located in their office at time of visit.  I discussed the limitations, risks, security and privacy concerns of performing an evaluation and management service by telephone and the availability of in person appointments. I also discussed with the patient that there may be a patient responsible charge related to this service. The patient expressed understanding and agreed to proceed.   History and Present Illness:  Pt presents to the office today for chronic follow up. She is followed by Cardiologists every 3 months for CAD and CHF. She is currently on palliative care and has an aid and family that stays with her 24/7.   She reports she is having a hard time walking long distances. She has to use a rolling walker at all times, however, she can not walk more than a few feet without having to relax.   She saw an Endocrinologists for hypercalcemia/hyperparathyroidism.  Hypertension This is a chronic problem. The current episode started more than 1 year ago. The problem has been resolved since onset. Associated symptoms include anxiety. Pertinent negatives include no malaise/fatigue, peripheral edema or shortness of breath. Risk factors for coronary artery disease include diabetes  mellitus, obesity and sedentary lifestyle. The current treatment provides moderate improvement. There is no history of CVA or heart failure. Identifiable causes of hypertension include a thyroid problem.  Thyroid Problem Presents for follow-up visit. Symptoms include anxiety, fatigue and hoarse voice. Patient reports no diaphoresis or dry skin. The symptoms have been stable. Her past medical history is significant for hyperlipidemia. There is no history of heart failure.  Arthritis Presents for follow-up visit. She complains of pain and stiffness. The symptoms have been stable. Affected locations include the right knee and left knee. Associated symptoms include fatigue.  Anxiety Presents for follow-up visit. Symptoms include excessive worry, irritability, nervous/anxious behavior and restlessness. Patient reports no shortness of breath. Symptoms occur occasionally. The severity of symptoms is moderate.    Depression        This is a chronic problem.  The current episode started more than 1 year ago.   Associated symptoms include fatigue, irritable and restlessness.  Associated symptoms include no helplessness and no hopelessness.  Past medical history includes thyroid problem and anxiety.   Hyperlipidemia This is a chronic problem. The current episode started more than 1 year ago. Exacerbating diseases include obesity. Pertinent negatives include no shortness of breath. Current antihyperlipidemic treatment includes statins. The current treatment provides moderate improvement of lipids. Risk factors for coronary artery disease include dyslipidemia, diabetes mellitus, hypertension, a sedentary lifestyle and post-menopausal.  Back Pain This is a chronic problem. The current episode started more than 1 year ago. The problem occurs intermittently. The problem has been waxing and waning since onset. The pain is present in the lumbar spine. The pain is at a  severity of 1/10. The pain is mild.  COPD Stable,  uses albuterol daily. Denies any SOB.     Review of Systems  Constitutional: Positive for fatigue and irritability. Negative for diaphoresis and malaise/fatigue.  HENT: Positive for hoarse voice.   Respiratory: Negative for shortness of breath.   Musculoskeletal: Positive for arthritis, back pain and stiffness.  Psychiatric/Behavioral: Positive for depression. The patient is nervous/anxious.   All other systems reviewed and are negative.    Observations/Objective: No SOB or distress noted   Assessment and Plan: 1. Chronic obstructive pulmonary disease with acute exacerbation ([name])  2. Hypothyroidism, unspecified type  3. Primary hyperparathyroidism (HCC)  4. Stage 3 chronic kidney disease, unspecified whether stage 3a or 3b CKD ([name])  5. GAD (generalized anxiety disorder) - ALPRAZolam (XANAX) 0.5 MG tablet; TAKE (1) TABLET TWICE A DAY.  Dispense: 60 tablet; Refill: 2  6. Mixed hyperlipidemia  7. Hypercalcemia  8. Moderate episode of recurrent major depressive disorder ([name])  9. Morbid obesity ([place])  10. Benzodiazepine dependence (HCC) - ALPRAZolam (XANAX) 0.5 MG tablet; TAKE (1) TABLET TWICE A DAY.  Dispense: 60 tablet; Refill: 2  11. Pain medication agreement signed  12. Chronic bilateral low back pain, unspecified whether sciatica present  13. Controlled substance agreement signed  - ALPRAZolam (XANAX) 0.5 MG tablet; TAKE (1) TABLET TWICE A DAY.  Dispense: 60 tablet; Refill: 2  Patient reviewed in [place] controlled database, no flags noted. Contract and drug screen are up to date.  Continue medications   Follow Up Instructions: 3 months    I discussed the assessment and treatment plan with the patient. The patient was provided an opportunity to ask questions and all were answered. The patient agreed with the plan and demonstrated an understanding of the instructions.   The patient was advised to call back or seek an in-person evaluation if the symptoms worsen or  if the condition fails to improve as anticipated.  The above assessment and management plan was discussed with the patient. The patient verbalized understanding of and has agreed to the management plan. Patient is aware to call the clinic if symptoms persist or worsen. Patient is aware when to return to the clinic for a follow-up visit. Patient educated on when it is appropriate to go to the emergency department.   Time call ended:  3: 07 pm   I provided 22 minutes of non-face-to-face time during this encounter.    [name], FNP

## 2020-05-16 NOTE — Addendum Note (Signed)
Addended by: [name] A on: 05/16/2020 04:42 PM   Modules accepted: Orders

## 2020-05-17 ENCOUNTER — Other Ambulatory Visit: Payer: Medicare Other

## 2020-05-17 ENCOUNTER — Ambulatory Visit: Payer: Medicare Other | Admitting: Family

## 2020-05-17 ENCOUNTER — Telehealth: Payer: Self-pay

## 2020-05-17 NOTE — Telephone Encounter (Signed)
Thyroid doctor told her she was in stage 3 kidney failure she would like to know how long this has been going states they have known nothing about would like to speak with Alyse Low when she returns back to work next week

## 2020-05-24 DIAGNOSIS — M1990 Unspecified osteoarthritis, unspecified site: Secondary | ICD-10-CM | POA: Diagnosis not present

## 2020-05-27 ENCOUNTER — Ambulatory Visit: Payer: Medicare Other | Admitting: Family

## 2020-05-27 NOTE — Telephone Encounter (Signed)
Spoke with Daughter. Pt does have CKD, but stable.

## 2020-05-30 ENCOUNTER — Other Ambulatory Visit: Payer: Self-pay | Admitting: Family Medicine

## 2020-05-30 ENCOUNTER — Telehealth: Payer: Self-pay | Admitting: Nurse Practitioner

## 2020-05-31 ENCOUNTER — Telehealth: Payer: Self-pay

## 2020-05-31 DIAGNOSIS — R32 Unspecified urinary incontinence: Secondary | ICD-10-CM

## 2020-05-31 NOTE — Telephone Encounter (Signed)
Pt needs PCP to send order for XL Pull Ups to her [employer]) ASAP.  Fax# [phone] Attn: Kimber Relic

## 2020-06-02 DIAGNOSIS — M542 Cervicalgia: Secondary | ICD-10-CM | POA: Diagnosis not present

## 2020-06-03 DIAGNOSIS — R32 Unspecified urinary incontinence: Secondary | ICD-10-CM | POA: Insufficient documentation

## 2020-06-03 NOTE — Telephone Encounter (Signed)
faxed

## 2020-06-03 NOTE — Telephone Encounter (Signed)
RX written, please fax.

## 2020-06-04 ENCOUNTER — Other Ambulatory Visit: Payer: Self-pay | Admitting: Family

## 2020-06-06 ENCOUNTER — Telehealth: Payer: Self-pay

## 2020-06-06 NOTE — Telephone Encounter (Signed)
  Prescription Request  06/06/2020  What is the name of the medication or equipment? Pull-ups (EX-Large), not diapers  Have you contacted your pharmacy to request a refill? (if applicable) yes  Which pharmacy would you like this sent to? Vickie Corum=case worker [phone]   Patient notified that their request is being sent to the clinical staff for review and that they should receive a response within 2 business days.   Lenna Gilford' pt.  Call [place] she will contact the place where she gets them.

## 2020-06-06 NOTE — Telephone Encounter (Signed)
Spoke with caseworker and she asked that we fax a prescription to her.  Prescription sent to provider to sign.  Patient aware.

## 2020-06-12 ENCOUNTER — Other Ambulatory Visit: Payer: Self-pay

## 2020-06-12 ENCOUNTER — Other Ambulatory Visit: Payer: Medicare Other | Admitting: Nurse Practitioner

## 2020-06-12 ENCOUNTER — Other Ambulatory Visit: Payer: Self-pay | Admitting: Family

## 2020-06-12 DIAGNOSIS — R52 Pain, unspecified: Secondary | ICD-10-CM

## 2020-06-12 DIAGNOSIS — Z515 Encounter for palliative care: Secondary | ICD-10-CM

## 2020-06-12 DIAGNOSIS — G629 Polyneuropathy, unspecified: Secondary | ICD-10-CM

## 2020-06-12 NOTE — Progress Notes (Signed)
Therapist, nutritional Palliative Care Consult Note Telephone: [phone]  Fax: [phone]    Date of encounter: 06/12/20 PATIENT NAME: Sheila [name] [address] [place] [address]   [phone] (home)  DOB: 04/30/1940 MRN: [id number]   PRIMARY CARE PROVIDER:    Junie Spencer, FNP,  [address] [place] [address] [phone]  REFERRING PROVIDER:   Junie Spencer, FNP [address] [place],  [place] [address] [phone]  RESPONSIBLE PARTY:    Contact Information    Name Relation Home Work [place] Niece [phone]  [phone]   Mabe,Kathy Daughter [phone]  [phone]   [facility] Daughter   [phone]   Roma Kayser Granddaughter   [phone]      I met face to face with patient and family in home. Palliative Care was asked to follow this patient by consultation request of  Junie Spencer, FNP to address advance care planning and complex medical decision making. This is a follow up visit.                                   ASSESSMENT AND PLAN / RECOMMENDATIONS:   Advance Care Planning/Goals of Care:  Code Status: DNR Goal of Care: Goal of care is comfort while preserving function. Directives: Signed DNR and MOST form in home, [facility] MOST form detailsinclude,limited additional interventions, determine use or limitation of antibiotics when infection occurs, IV fluids if indicated, feeding tube long term if indicated.  Symptom Management/Plan: Neck pain: FROM noted to neck, no swelling, no redness, no mass palpated. Of note patient sleeps in a recliner, recliner noted with poor neck support. Patient encouraged to consider sleeping in her bed intermittently. Patient report mild relief with heating pad. Consider using ice pack, place a thin cloth between the ice and your skin, apply to neck/shoulders every 10 to 15 minutes every 2 to 3 hours as needed. Prescription  for Lidocaine patch to sent to her pharmacy, 14 patches ordered. Patient to apply one patch to neck/shoulder area 12hrs/day.  Consider imaging if no relief in symptoms or symptoms worsened, patient to notify her PCP promptly.  Follow up Palliative Care Visit: Palliative care will continue to follow for complex medical decision making, advance care planning, and clarification of goals. Return in about 4 weeks or prn.  This visit was coded based on medical decision making (MDM).  PPS: 50%  HOSPICE ELIGIBILITY/DIAGNOSIS: TBD  Chief Complaint: Neck pain  HISTORY OF PRESENT ILLNESS:  BO ROGUE is a 80 y.o. year old female  with complaints of neck and shoulder pain, pain started 3 weeks ago, pain is persistent,  report it hurts all the time and nothing makes it better. Has used heating pad, topical pain med Bengay, and electro pain stimili without significant improvement. Patient was evalauted at [facility] urgent care on 06/02/2020, she was prescribed Baclofen 10mg  which was stopped due to it causing patient confusion. She denied dizziness, denied increased SOB, moves all extremities, denied fever or chills, 10 systems reviewed and are negative for acute change, except as noted in the HPI. Report ongoing headache that is chronic usually relieved byTramadol. No report of recent fall or trauma to neck.  History obtained from review of Epic EMR, discussion with patient and her family.  I reviewed available labs, medications, imaging, studies and related documents from the EMR.  Records reviewed and summarized above.   Physical Exam: Vital Signs: BP 146/70, P 98 , RR 18, 93% on room air General: frail appearing, morbidly obese, sleeping in recliner in NAD, woke up to her name EYES: anicteric sclera, no discharge  ENMT: intact hearing, oral mucous membranes moist CV: no LE edema Pulmonary: no increased work of breathing, no cough, no wheezing, room air Abdomen: no ascites GU: deferred MSK:  ambulatory Skin: warm and dry, no rashes or wounds on visible skin Neuro: Generalized weakness, A and O x 3 Psych: non-anxious affecttoday Hem/lymph/immuno: no widespread bruising   Thank you for the opportunity to participate in the care of Ms. Leinbach.  The palliative care team will continue to follow. Please call our office at [phone] if we can be of additional assistance.   Jari Favre, DNP, AGPCNP-BC  COVID-19 PATIENT SCREENING TOOL Asked and negative response unless otherwise noted:   Have you had symptoms of covid, tested positive or been in contact with someone with symptoms/positive test in the past 5-10 days?

## 2020-06-17 ENCOUNTER — Other Ambulatory Visit: Payer: Self-pay

## 2020-06-17 ENCOUNTER — Other Ambulatory Visit: Payer: Self-pay | Admitting: Family

## 2020-06-17 ENCOUNTER — Encounter: Payer: Self-pay | Admitting: Family

## 2020-06-17 ENCOUNTER — Ambulatory Visit (INDEPENDENT_AMBULATORY_CARE_PROVIDER_SITE_OTHER): Payer: Medicare Other | Admitting: Family

## 2020-06-17 VITALS — BP 120/72 | Temp 97.8°F | Ht 59.0 in | Wt 220.0 lb

## 2020-06-17 DIAGNOSIS — N183 Chronic kidney disease, stage 3 unspecified: Secondary | ICD-10-CM

## 2020-06-17 DIAGNOSIS — F411 Generalized anxiety disorder: Secondary | ICD-10-CM

## 2020-06-17 DIAGNOSIS — Z79891 Long term (current) use of opiate analgesic: Secondary | ICD-10-CM | POA: Diagnosis not present

## 2020-06-17 DIAGNOSIS — G629 Polyneuropathy, unspecified: Secondary | ICD-10-CM | POA: Diagnosis not present

## 2020-06-17 DIAGNOSIS — E782 Mixed hyperlipidemia: Secondary | ICD-10-CM | POA: Diagnosis not present

## 2020-06-17 DIAGNOSIS — E039 Hypothyroidism, unspecified: Secondary | ICD-10-CM | POA: Diagnosis not present

## 2020-06-17 DIAGNOSIS — M159 Polyosteoarthritis, unspecified: Secondary | ICD-10-CM

## 2020-06-17 DIAGNOSIS — R350 Frequency of micturition: Secondary | ICD-10-CM | POA: Diagnosis not present

## 2020-06-17 DIAGNOSIS — Z79899 Other long term (current) drug therapy: Secondary | ICD-10-CM | POA: Diagnosis not present

## 2020-06-17 DIAGNOSIS — J449 Chronic obstructive pulmonary disease, unspecified: Secondary | ICD-10-CM

## 2020-06-17 DIAGNOSIS — M545 Low back pain, unspecified: Secondary | ICD-10-CM

## 2020-06-17 DIAGNOSIS — M8949 Other hypertrophic osteoarthropathy, multiple sites: Secondary | ICD-10-CM | POA: Diagnosis not present

## 2020-06-17 DIAGNOSIS — G8929 Other chronic pain: Secondary | ICD-10-CM

## 2020-06-17 DIAGNOSIS — M549 Dorsalgia, unspecified: Secondary | ICD-10-CM | POA: Diagnosis not present

## 2020-06-17 DIAGNOSIS — I10 Essential (primary) hypertension: Secondary | ICD-10-CM

## 2020-06-17 DIAGNOSIS — F1320 Sedative, hypnotic or anxiolytic dependence, uncomplicated: Secondary | ICD-10-CM

## 2020-06-17 DIAGNOSIS — Z0289 Encounter for other administrative examinations: Secondary | ICD-10-CM | POA: Diagnosis not present

## 2020-06-17 LAB — URINALYSIS, MICROSCOPIC ONLY
Epithelial Cells (non renal): NONE SEEN /hpf (ref 0–10)
WBC, UA: >30 /hpf — AB (ref 0–5)

## 2020-06-17 MED ORDER — TRAMADOL HCL 50 MG PO TABS: 100.0000 mg | ORAL_TABLET | Freq: Two times a day (BID) | ORAL | 2 refills | Status: DC | PRN

## 2020-06-17 MED ORDER — CEPHALEXIN 500 MG PO CAPS: 500.0000 mg | ORAL_CAPSULE | Freq: Two times a day (BID) | ORAL | 0 refills | Status: DC

## 2020-06-17 MED ORDER — BETAMETHASONE SOD PHOS & ACET 30 MG/5ML IJ KIT
6.0000 mg | PACK | Freq: Once | INTRAMUSCULAR | Status: AC
  Administered 2020-06-17: 6 mg via INTRAMUSCULAR

## 2020-06-17 MED ORDER — KETOROLAC TROMETHAMINE 60 MG/2ML IM SOLN
30.0000 mg | Freq: Once | INTRAMUSCULAR | Status: AC
  Administered 2020-06-17: 30 mg via INTRAMUSCULAR

## 2020-06-17 MED ORDER — BUDESONIDE-FORMOTEROL FUMARATE 160-4.5 MCG/ACT IN AERO: 2.0000 | INHALATION_SPRAY | Freq: Two times a day (BID) | RESPIRATORY_TRACT | 0 refills | Status: DC

## 2020-06-17 MED ORDER — ALPRAZOLAM 0.5 MG PO TABS: ORAL_TABLET | ORAL | 2 refills | Status: DC

## 2020-06-17 MED ORDER — GABAPENTIN 300 MG PO CAPS: ORAL_CAPSULE | ORAL | 0 refills | Status: DC

## 2020-06-17 NOTE — Patient Instructions (Signed)
https://doi.[email or web address]">  Chronic Back Pain When back pain lasts longer than 3 months, it is called chronic back pain. The cause of your back pain may not be known. Some common causes include:  Wear and tear (degenerative disease) of the bones, ligaments, or disks in your back.  Inflammation and stiffness in your back (arthritis). People who have chronic back pain often go through certain periods in which the pain is more intense (flare-ups). Many people can learn to manage the pain with home care. Follow these instructions at home: Pay attention to any changes in your symptoms. Take these actions to help with your pain: Managing pain and stiffness  If directed, apply ice to the painful area. Your health care provider may recommend applying ice during the first 24-48 hours after a flare-up begins. To do this: ? Put ice in a plastic bag. ? Place a towel between your skin and the bag. ? Leave the ice on for 20 minutes, 2-3 times per day.  If directed, apply heat to the affected area as often as told by your health care provider. Use the heat source that your health care provider recommends, such as a moist heat pack or a heating pad. ? Place a towel between your skin and the heat source. ? Leave the heat on for 20-30 minutes. ? Remove the heat if your skin turns bright red. This is especially important if you are unable to feel pain, heat, or cold. You may have a greater risk of getting burned.  Try soaking in a warm tub.      Activity  Avoid bending and other activities that make the problem worse.  Maintain a proper position when standing or sitting: ? When standing, keep your upper back and neck straight, with your shoulders pulled back. Avoid slouching. ? When sitting, keep your back straight and relax your shoulders. Do not round your shoulders or pull them backward.  Do not sit or stand in one place for long periods of time.  Take brief periods of rest  throughout the day. This will reduce your pain. Resting in a lying or standing position is usually better than sitting to rest.  When you are resting for longer periods, mix in some mild activity or stretching between periods of rest. This will help to prevent stiffness and pain.  Get regular exercise. Ask your health care provider what activities are safe for you.  Do not lift anything that is heavier than 10 lb (4.5 kg), or the limit that you are told, until your health care provider says that it is safe. Always use proper lifting technique, which includes: ? Bending your knees. ? Keeping the load close to your body. ? Avoiding twisting.  Sleep on a firm mattress in a comfortable position. Try lying on your side with your knees slightly bent. If you lie on your back, put a pillow under your knees.   Medicines  Treatment may include medicines for pain and inflammation taken by mouth or applied to the skin, prescription pain medicine, or muscle relaxants. Take over-the-counter and prescription medicines only as told by your health care provider.  Ask your health care provider if the medicine prescribed to you: ? Requires you to avoid driving or using machinery. ? Can cause constipation. You may need to take these actions to prevent or treat constipation:  Drink enough fluid to keep your urine pale yellow.  Take over-the-counter or prescription medicines.  Eat foods that are high in fiber, such as   beans, whole grains, and fresh fruits and vegetables.  Limit foods that are high in fat and processed sugars, such as fried or sweet foods. General instructions  Do not use any products that contain nicotine or tobacco, such as cigarettes, e-cigarettes, and chewing tobacco. If you need help quitting, ask your health care provider.  Keep all follow-up visits as told by your health care provider. This is important. Contact a health care provider if:  You have pain that is not relieved with  rest or medicine.  Your pain gets worse, or you have new pain.  You have a high fever.  You have rapid weight loss.  You have trouble doing your normal activities. Get help right away if:  You have weakness or numbness in one or both of your legs or feet.  You have trouble controlling your bladder or your bowels.  You have severe back pain and have any of the following: ? Nausea or vomiting. ? Pain in your abdomen. ? Shortness of breath or you faint. Summary  Chronic back pain is back pain that lasts longer than 3 months.  When a flare-up begins, apply ice to the painful area for the first 24-48 hours.  Apply a moist heat pad or use a heating pad on the painful area as directed by your health care provider.  When you are resting for longer periods, mix in some mild activity or stretching between periods of rest. This will help to prevent stiffness and pain. This information is not intended to replace advice given to you by your health care provider. Make sure you discuss any questions you have with your health care provider. Document Revised: 03/22/2019 Document Reviewed: 03/22/2019 Elsevier Patient Education  2021 Elsevier Inc.  

## 2020-06-17 NOTE — Addendum Note (Signed)
Addended by: Ladean Raya on: 06/17/2020 03:18 PM   Modules accepted: Orders

## 2020-06-17 NOTE — Progress Notes (Signed)
Subjective:    Patient ID: Sheila [name], female    DOB: Jun 03, 1940, 80 y.o.   MRN: [id number]  Chief Complaint  Patient presents with  . Medical Management of Chronic Issues    Carelina apoth has still not sent her pull ups  . Hypertension    States she take  Lipitor not sure what mg   . Pain Management    Went to [facility] could not take the baclofen made her loopy. Wants to up tramadol to every 6 hours  . Chest Pain    But states xanax stops the chest pain   Pt presents to the office today for chronic follow up. She is followed by Cardiologists every 3 months for CAD and CHF. She is currently on palliative care and has an aid and family that stays with her 24/7.   She reports she is having a hard time walking long distances. She has to use a rolling walker at all times, however, she can not walk more than a few feet without having to relax.  She saw an Endocrinologists for hypercalcemia/hyperparathyroidism.  Hypertension This is a chronic problem. The current episode started more than 1 year ago. The problem has been resolved since onset. The problem is controlled. Associated symptoms include chest pain, malaise/fatigue, peripheral edema and shortness of breath. Risk factors for coronary artery disease include dyslipidemia, obesity and sedentary lifestyle. The current treatment provides moderate improvement. Hypertensive end-organ damage includes CAD/MI. Identifiable causes of hypertension include a thyroid problem.  Chest Pain  This is a new problem. The onset quality is gradual. The problem occurs intermittently (monthly). The problem has been waxing and waning. The pain is moderate. Associated symptoms include back pain, malaise/fatigue and shortness of breath.  Her past medical history is significant for hypertension and thyroid problem.  Thyroid Problem Presents for follow-up visit. Symptoms include anxiety, depressed mood and fatigue. Patient reports no hoarse voice.  The symptoms have been stable.  Arthritis Presents for follow-up visit. She complains of pain and stiffness. The symptoms have been stable. Affected locations include the left knee, right knee, left shoulder and right shoulder (back). Her pain is at a severity of 10/10. Associated symptoms include fatigue.  Back Pain This is a chronic problem. The current episode started more than 1 year ago. The problem occurs intermittently. The problem has been waxing and waning since onset. The pain is present in the lumbar spine. The pain is at a severity of 10/10. The pain is moderate. Associated symptoms include chest pain. She has tried bed rest for the symptoms. The treatment provided mild relief.   COPD  Uses Symbicort BID. SOB with exertion.   Review of Systems  Constitutional: Positive for fatigue and malaise/fatigue.  HENT: Negative for hoarse voice.   Respiratory: Positive for shortness of breath.   Cardiovascular: Positive for chest pain.  Musculoskeletal: Positive for arthritis, back pain and stiffness.  Psychiatric/Behavioral: The patient is nervous/anxious.   All other systems reviewed and are negative.      Objective:   Physical Exam Vitals reviewed.  Constitutional:      General: She is not in acute distress.    Appearance: She is well-developed. She is obese.  HENT:     Head: Normocephalic and atraumatic.     Right Ear: External ear normal.  Eyes:     Pupils: Pupils are equal, round, and reactive to light.  Neck:     Thyroid: No thyromegaly.  Cardiovascular:  Rate and Rhythm: Normal rate and regular rhythm.     Heart sounds: Normal heart sounds. No murmur heard.   Pulmonary:     Effort: Pulmonary effort is normal. No respiratory distress.     Breath sounds: Wheezing present.  Abdominal:     General: Bowel sounds are normal. There is no distension.     Palpations: Abdomen is soft.     Tenderness: There is no abdominal tenderness.  Musculoskeletal:        General:  No tenderness.     Cervical back: Normal range of motion and neck supple.     Comments: Pain in lumbar with flexion and extension, patient in wheelchair because of pain in back  Skin:    General: Skin is warm and dry.  Neurological:     Mental Status: She is alert and oriented to person, place, and time.     Cranial Nerves: No cranial nerve deficit.     Motor: Weakness present.     Gait: Gait abnormal.     Deep Tendon Reflexes: Reflexes are normal and symmetric.  Psychiatric:        Behavior: Behavior normal.        Thought Content: Thought content normal.        Judgment: Judgment normal.       BP 120/72   Temp 97.8 F (36.6 C) (Temporal)   Ht $R'4\' 11"'EI$  (1.499 m)   Wt 220 lb (99.8 kg)   SpO2 95%   BMI 44.43 kg/m      Assessment & Plan:  GENECIS VELEY comes in today with chief complaint of Medical Management of Chronic Issues ([facility]lina apoth has still not sent her pull ups), Hypertension (States she take  Lipitor not sure what mg ), Pain Management (Went to [facility] could not take the baclofen made her loopy. Wants to up tramadol to every 6 hours), and Chest Pain (But states xanax stops the chest pain)   Diagnosis and orders addressed:  1. Neuropathy - gabapentin (NEURONTIN) 300 MG capsule; TAKE (1) CAPSULE THREE TIMES DAILY.  Dispense: 270 capsule; Refill: 0 - CMP14+EGFR - CBC with Differential/Platelet  2. Chronic obstructive pulmonary disease, unspecified COPD type (HCC) - budesonide-formoterol (SYMBICORT) 160-4.5 MCG/ACT inhaler; Inhale 2 puffs into the lungs 2 (two) times daily.  Dispense: 10.2 g; Refill: 0 - CMP14+EGFR - CBC with Differential/Platelet  3. GAD (generalized anxiety disorder) - ALPRAZolam (XANAX) 0.5 MG tablet; TAKE (1) TABLET TWICE A DAY.  Dispense: 60 tablet; Refill: 2 - CMP14+EGFR - CBC with Differential/Platelet  4. Benzodiazepine dependence (HCC) - ALPRAZolam (XANAX) 0.5 MG tablet; TAKE (1) TABLET TWICE A DAY.  Dispense: 60 tablet;  Refill: 2 - CMP14+EGFR - CBC with Differential/Platelet  5. Controlled substance agreement signed - ALPRAZolam (XANAX) 0.5 MG tablet; TAKE (1) TABLET TWICE A DAY.  Dispense: 60 tablet; Refill: 2 - CMP14+EGFR - CBC with Differential/Platelet  6. Essential hypertension, benign - CMP14+EGFR - CBC with Differential/Platelet  7. Hypothyroidism, unspecified type - CMP14+EGFR - CBC with Differential/Platelet - TSH  8. Primary osteoarthritis involving multiple joints - CMP14+EGFR - CBC with Differential/Platelet  9. Stage 3 chronic kidney disease, unspecified whether stage 3a or 3b CKD (HCC) - CMP14+EGFR - CBC with Differential/Platelet  10. Chronic bilateral low back pain, unspecified whether sciatica present - CMP14+EGFR - CBC with Differential/Platelet  11. Mixed hyperlipidemia - CMP14+EGFR - CBC with Differential/Platelet  12. Morbid obesity ([place]) - CMP14+EGFR - CBC with Differential/Platelet  13. Pain medication agreement  signed - ToxASSURE Select 13 (MW), Urine - CMP14+EGFR - CBC with Differential/Platelet  14. Chronic bilateral back pain, unspecified back location - traMADol (ULTRAM) 50 MG tablet; Take 2 tablets (100 mg total) by mouth every 12 (twelve) hours as needed.  Dispense: 120 tablet; Refill: 2 - Betamethasone Sod Phos & Acet 30 MG/5ML KIT 6 mg - ketorolac (TORADOL) injection 30 mg   Labs pending Patient reviewed in [place] controlled database, no flags noted. Contract and drug screen updated today.  Health Maintenance reviewed Diet and exercise encouraged  Follow up plan: 3 months    [name], FNP

## 2020-06-18 ENCOUNTER — Telehealth: Payer: Self-pay | Admitting: *Deleted

## 2020-06-18 LAB — CMP14+EGFR
ALT: 9 IU/L (ref 0–32)
AST: 16 IU/L (ref 0–40)
Albumin/Globulin Ratio: 1.2 (ref 1.2–2.2)
Albumin: 4.3 g/dL (ref 3.7–4.7)
Alkaline Phosphatase: 67 IU/L (ref 44–121)
BUN/Creatinine Ratio: 15 (ref 12–28)
BUN: 19 mg/dL (ref 8–27)
Bilirubin Total: 1 mg/dL (ref 0.0–1.2)
CO2: 18 mmol/L — ABNORMAL LOW (ref 20–29)
Calcium: 10.6 mg/dL — ABNORMAL HIGH (ref 8.7–10.3)
Chloride: 98 mmol/L (ref 96–106)
Creatinine, Ser: 1.23 mg/dL — ABNORMAL HIGH (ref 0.57–1.00)
Globulin, Total: 3.6 g/dL (ref 1.5–4.5)
Glucose: 121 mg/dL — ABNORMAL HIGH (ref 65–99)
Potassium: 4.7 mmol/L (ref 3.5–5.2)
Sodium: 137 mmol/L (ref 134–144)
Total Protein: 7.9 g/dL (ref 6.0–8.5)
eGFR: 45 mL/min/{1.73_m2} — ABNORMAL LOW (ref >59)

## 2020-06-18 LAB — CBC WITH DIFFERENTIAL/PLATELET
Basophils Absolute: 0.1 10*3/uL (ref 0.0–0.2)
Basos: 1 %
EOS (ABSOLUTE): 0.4 10*3/uL (ref 0.0–0.4)
Eos: 2 %
Hematocrit: 39.3 % (ref 34.0–46.6)
Hemoglobin: 12.9 g/dL (ref 11.1–15.9)
Immature Grans (Abs): 0.1 10*3/uL (ref 0.0–0.1)
Immature Granulocytes: 1 %
Lymphocytes Absolute: 4.5 10*3/uL — ABNORMAL HIGH (ref 0.7–3.1)
Lymphs: 29 %
MCH: 29.3 pg (ref 26.6–33.0)
MCHC: 32.8 g/dL (ref 31.5–35.7)
MCV: 89 fL (ref 79–97)
Monocytes Absolute: 1 10*3/uL — ABNORMAL HIGH (ref 0.1–0.9)
Monocytes: 7 %
Neutrophils Absolute: 9.6 10*3/uL — ABNORMAL HIGH (ref 1.4–7.0)
Neutrophils: 60 %
Platelets: 344 10*3/uL (ref 150–450)
RBC: 4.41 x10E6/uL (ref 3.77–5.28)
RDW: 12.4 % (ref 11.7–15.4)
WBC: 15.7 10*3/uL — ABNORMAL HIGH (ref 3.4–10.8)

## 2020-06-18 LAB — TSH: TSH: 1.18 u[IU]/mL (ref 0.450–4.500)

## 2020-06-18 NOTE — Telephone Encounter (Signed)
PA came in for pt  symbicort not covered by plan Here are options that are covered:    Advair Diskus 250-50 Mcg/Dose Dsdv Not Required Advair HFA 115-21 Mcg/Actuation HFAA Not Required Anoro Ellipta Not Required Breo Ellipta 100-25 Mcg/Dose Dsdv Not Required Arnell Sieving Not Required Combivent Respimat Not Required Dulera 200-5 Mcg/Actuation HFAA Not Required Flovent HFA 110 Mcg/Actuation HFAA Not Required Trelegy Ellipta 100-62.5-25 Mcg Dsdv Not Required

## 2020-06-18 NOTE — Addendum Note (Signed)
Addended by: Alba Destine on: 06/18/2020 09:01 PM   Modules accepted: Level of Service

## 2020-06-20 ENCOUNTER — Telehealth: Payer: Self-pay

## 2020-06-20 MED ORDER — FLUTICASONE FUROATE-VILANTEROL 100-25 MCG/INH IN AEPB: 1.0000 | INHALATION_SPRAY | Freq: Every day | RESPIRATORY_TRACT | 11 refills | Status: DC

## 2020-06-20 NOTE — Telephone Encounter (Signed)
Symbicort changed to [facility]. Prescription sent to pharmacy

## 2020-06-22 LAB — URINE CULTURE

## 2020-06-23 DIAGNOSIS — M1990 Unspecified osteoarthritis, unspecified site: Secondary | ICD-10-CM | POA: Diagnosis not present

## 2020-06-24 LAB — TOXASSURE SELECT 13 (MW), URINE

## 2020-06-26 ENCOUNTER — Ambulatory Visit (INDEPENDENT_AMBULATORY_CARE_PROVIDER_SITE_OTHER): Payer: Medicare Other | Admitting: Family Medicine

## 2020-06-26 ENCOUNTER — Encounter: Payer: Self-pay | Admitting: Family Medicine

## 2020-06-26 ENCOUNTER — Other Ambulatory Visit: Payer: Self-pay

## 2020-06-26 VITALS — BP 133/81 | HR 110 | Temp 98.5°F

## 2020-06-26 DIAGNOSIS — M5441 Lumbago with sciatica, right side: Secondary | ICD-10-CM | POA: Diagnosis not present

## 2020-06-26 DIAGNOSIS — G8929 Other chronic pain: Secondary | ICD-10-CM

## 2020-06-26 DIAGNOSIS — M5442 Lumbago with sciatica, left side: Secondary | ICD-10-CM | POA: Diagnosis not present

## 2020-06-26 NOTE — Progress Notes (Signed)
Assessment & Plan:  1. Chronic bilateral low back pain with bilateral sciatica Discussed with patient that I cannot change her prescription for tramadol as I am not her PCP and she has a controlled substance agreement with her PCP.  After explaining this she and her daughter stated that was fine but could I please figure out why her pain is getting worse.  Discussed if they wanted further answers patient would need to have a MRI.  Patient is not agreeable to a MRI due to the anxiety she experiences when she has them.  We also discussed that since she is on palliative care and would not want to have surgery the treatment plan does not change even if a MRI was ordered.  Ultimately, patient needs to follow-up with her PCP regarding her uncontrolled chronic pain.  A follow-up was scheduled in 2 days.   Follow up plan: Return in about 2 days (around 06/28/2020) for back pain.  Hendricks Limes, MSN, APRN, FNP-C [facility]  Subjective:   Patient ID: Sheila [name], female    DOB: 1940/05/03, 80 y.o.   MRN: [id number]  HPI: Sheila [name] is a 80 y.o. female presenting on 06/26/2020 for Back Pain (Patient states she has been having lower back pain x 2 weeks.)  Patient is accompanied by her daughter who she is okay with being present.  Patient reports she is here due to her chronic low back pain.  She states it has been worse for the past 1 to 2 months since her chair was pulled out from the wall.  States that she saw her PCP recently and was told the frequency of her tramadol was going to be increased and this was not changed on her prescription.   ROS: Negative unless specifically indicated above in HPI.   Relevant past medical history reviewed and updated as indicated.   Allergies and medications reviewed and updated.   Current Outpatient Medications:  .  albuterol (PROVENTIL) (2.5 MG/3ML) 0.083% nebulizer solution, NEBULIZE 1 VIAL EVERY 6-8 HOURS AS NEEDED FOR WHEEZING OR  SHORTNESS OF BREATH, Disp: 180 mL, Rfl: 1 .  ALPRAZolam (XANAX) 0.5 MG tablet, TAKE (1) TABLET TWICE A DAY., Disp: 60 tablet, Rfl: 2 .  Cholecalciferol (VITAMIN D3) 25 MCG (1000 UT) CAPS, Take 1,000 Units by mouth daily. , Disp: , Rfl:  .  cinacalcet (SENSIPAR) 30 MG tablet, Take 1 tablet (30 mg total) by mouth every other day., Disp: 60 tablet, Rfl: 2 .  clotrimazole-betamethasone (LOTRISONE) cream, APPLY TO AFFECTED AREAS TWICE A DAY, Disp: 30 g, Rfl: 0 .  diclofenac sodium (VOLTAREN) 1 % GEL, Apply 2 g topically 4 (four) times daily. (Patient taking differently: Apply 2 g topically 4 (four) times daily as needed (pain).), Disp: 100 g, Rfl: 0 .  DULoxetine (CYMBALTA) 60 MG capsule, TAKE 1 CAPUSLE ONCE DAILY, Disp: 90 capsule, Rfl: 0 .  ENTRESTO 49-51 MG, TAKE 1 TABLET 2 TIMES A DAY, Disp: 60 tablet, Rfl: 11 .  fluticasone (FLONASE) 50 MCG/ACT nasal spray, PLACE 2 SPRAYS INTO BOTH NOSTRILS DAILY, Disp: 16 g, Rfl: 2 .  fluticasone furoate-vilanterol (BREO ELLIPTA) 100-25 MCG/INH AEPB, Inhale 1 puff into the lungs daily., Disp: 1 each, Rfl: 11 .  gabapentin (NEURONTIN) 300 MG capsule, TAKE (1) CAPSULE THREE TIMES DAILY., Disp: 270 capsule, Rfl: 0 .  gabapentin (NEURONTIN) 300 MG capsule, TAKE (1) CAPSULE THREE TIMES DAILY., Disp: 270 capsule, Rfl: 0 .  Garlic 10 MG CAPS, Take by mouth.,  Disp: , Rfl:  .  levocetirizine (XYZAL) 5 MG tablet, TAKE 1 TABLET ONCE DAILY IN THE EVENING, Disp: 30 tablet, Rfl: 5 .  levothyroxine (SYNTHROID) 88 MCG tablet, Take 1 tablet (88 mcg total) by mouth daily., Disp: 90 tablet, Rfl: 1 .  meclizine (ANTIVERT) 25 MG tablet, Take 1 tablet (25 mg total) by mouth 3 (three) times daily as needed for dizziness., Disp: 90 tablet, Rfl: 3 .  Multiple Vitamin (MULTIVITAMIN WITH MINERALS) TABS tablet, Take 1 tablet by mouth daily., Disp: , Rfl:  .  Omega-3 Fatty Acids (FISH OIL OMEGA-3 PO), Take 1 capsule by mouth every evening. , Disp: , Rfl:  .  pantoprazole (PROTONIX) 20 MG  tablet, Take 1 tablet (20 mg total) by mouth at bedtime., Disp: 90 tablet, Rfl: 2 .  Pitavastatin Calcium (LIVALO) 4 MG TABS, Take 1 tablet (4 mg total) by mouth daily., Disp: 90 tablet, Rfl: 1 .  potassium chloride SA (KLOR-CON) 20 MEQ tablet, TAKE (1) TABLET TWICE A DAY., Disp: 180 tablet, Rfl: 0 .  spironolactone (ALDACTONE) 25 MG tablet, TAKE 1 TABLET ONCE DAILY, Disp: 30 tablet, Rfl: 11 .  Tetrahydrozoline HCl (VISINE OP), Place 1 drop into both eyes daily as needed (dry eyes/itching)., Disp: , Rfl:  .  torsemide (DEMADEX) 20 MG tablet, Take 2 tablets (40 mg total) by mouth daily., Disp: 60 tablet, Rfl: 5 .  traMADol (ULTRAM) 50 MG tablet, Take 2 tablets (100 mg total) by mouth every 12 (twelve) hours as needed., Disp: 120 tablet, Rfl: 2 .  triamcinolone ointment (KENALOG) 0.5 %, APPLY TO AFFECTED AREAS TWICE A DAY, Disp: 30 g, Rfl: 1  Allergies  Allergen Reactions  . Ibuprofen Other (See Comments)    History of bleeding ulcers - contra-indicated   . Benadryl [Diphenhydramine Hcl] Other (See Comments)    Worsening depression   . Lipitor [Atorvastatin] Other (See Comments)    Body aches.  . Baclofen Other (See Comments)    Loopy   . Hydrocodone Nausea And Vomiting  . Codeine Nausea And Vomiting  . Morphine And Related Nausea And Vomiting  . Tdap [Tetanus-Diphth-Acell Pertussis] Swelling    Redness and localized swelling at injection site     Objective:   BP 133/81   Pulse (!) 110   Temp 98.5 F (36.9 C) (Temporal)   SpO2 95%    Physical Exam Vitals reviewed.  Constitutional:      General: She is not in acute distress.    Appearance: Normal appearance. She is not ill-appearing, toxic-appearing or diaphoretic.  HENT:     Head: Normocephalic and atraumatic.  Eyes:     General: No scleral icterus.       Right eye: No discharge.        Left eye: No discharge.     Conjunctiva/sclera: Conjunctivae normal.  Cardiovascular:     Rate and Rhythm: Normal rate.  Pulmonary:      Effort: Pulmonary effort is normal. No respiratory distress.  Musculoskeletal:        General: Normal range of motion.     Cervical back: Normal range of motion.  Skin:    General: Skin is warm and dry.     Capillary Refill: Capillary refill takes less than 2 seconds.  Neurological:     General: No focal deficit present.     Mental Status: She is alert and oriented to person, place, and time. Mental status is at baseline.  Psychiatric:        Mood  and Affect: Mood normal.        Behavior: Behavior normal.        Thought Content: Thought content normal.        Judgment: Judgment normal.

## 2020-06-26 NOTE — Telephone Encounter (Signed)
Seen Sheila [name] today appt made with [facility] Friday

## 2020-06-28 ENCOUNTER — Ambulatory Visit: Payer: Medicare Other | Admitting: Family

## 2020-06-28 ENCOUNTER — Other Ambulatory Visit: Payer: Self-pay

## 2020-06-28 ENCOUNTER — Encounter: Payer: Self-pay | Admitting: Family

## 2020-06-28 ENCOUNTER — Ambulatory Visit (INDEPENDENT_AMBULATORY_CARE_PROVIDER_SITE_OTHER): Payer: Medicare Other | Admitting: Family

## 2020-06-28 ENCOUNTER — Ambulatory Visit (INDEPENDENT_AMBULATORY_CARE_PROVIDER_SITE_OTHER): Payer: Medicare Other

## 2020-06-28 VITALS — BP 135/84 | HR 90 | Temp 97.0°F | Ht 59.0 in | Wt 220.0 lb

## 2020-06-28 DIAGNOSIS — M549 Dorsalgia, unspecified: Secondary | ICD-10-CM | POA: Diagnosis not present

## 2020-06-28 DIAGNOSIS — N3001 Acute cystitis with hematuria: Secondary | ICD-10-CM

## 2020-06-28 DIAGNOSIS — G8929 Other chronic pain: Secondary | ICD-10-CM

## 2020-06-28 DIAGNOSIS — J441 Chronic obstructive pulmonary disease with (acute) exacerbation: Secondary | ICD-10-CM

## 2020-06-28 DIAGNOSIS — M545 Low back pain, unspecified: Secondary | ICD-10-CM | POA: Diagnosis not present

## 2020-06-28 DIAGNOSIS — R059 Cough, unspecified: Secondary | ICD-10-CM | POA: Diagnosis not present

## 2020-06-28 LAB — URINALYSIS, COMPLETE
Bilirubin, UA: NEGATIVE
Glucose, UA: NEGATIVE
Ketones, UA: NEGATIVE
Nitrite, UA: POSITIVE — AB
Specific Gravity, UA: 1.02 (ref 1.005–1.030)
Urobilinogen, Ur: 2 mg/dL — ABNORMAL HIGH (ref 0.2–1.0)
pH, UA: 6 (ref 5.0–7.5)

## 2020-06-28 LAB — MICROSCOPIC EXAMINATION
Epithelial Cells (non renal): NONE SEEN /hpf (ref 0–10)
WBC, UA: >30 /hpf — AB (ref 0–5)

## 2020-06-28 MED ORDER — CEFTRIAXONE SODIUM 1 G IJ SOLR
1.0000 g | Freq: Once | INTRAMUSCULAR | Status: AC
  Administered 2020-06-28: 1 g via INTRAMUSCULAR

## 2020-06-28 MED ORDER — PREDNISONE 10 MG (21) PO TBPK
ORAL_TABLET | ORAL | 0 refills | Status: DC
Start: 2020-06-28 — End: 2020-09-06

## 2020-06-28 MED ORDER — DOXYCYCLINE HYCLATE 100 MG PO TABS: 100.0000 mg | ORAL_TABLET | Freq: Two times a day (BID) | ORAL | 0 refills | Status: DC

## 2020-06-28 NOTE — Patient Instructions (Signed)

## 2020-06-28 NOTE — Progress Notes (Signed)
Subjective:    Patient ID: Sheila [name], female    DOB: 11/21/1940, 80 y.o.   MRN: [id number]  Chief Complaint  Patient presents with  . Medication Problem    DISCUSS TRAMADOL WANTS 3 TIMES A DAY   . DISORENTED   . Bleeding/Bruising    LEFT EAR   . Cough   Pt presents to the office today with recurrent back pain and cough.  Cough This is a new problem. The current episode started 1 to 4 weeks ago. The problem has been gradually worsening. The problem occurs every few minutes. The cough is productive of purulent sputum. Associated symptoms include nasal congestion, shortness of breath and wheezing. Pertinent negatives include no chills, ear congestion, ear pain, fever, headaches or myalgias. The symptoms are aggravated by lying down.  Back Pain This is a recurrent problem. The current episode started more than 1 month ago. The problem occurs constantly. The problem has been waxing and waning since onset. The pain is present in the lumbar spine. The quality of the pain is described as aching. The pain is at a severity of 8/10. The pain is moderate. The symptoms are aggravated by bending and standing. Pertinent negatives include no fever, headaches, pelvic pain or weakness.      Review of Systems  Constitutional: Negative for chills and fever.  HENT: Negative for ear pain.   Respiratory: Positive for cough, shortness of breath and wheezing.   Genitourinary: Negative for pelvic pain.  Musculoskeletal: Positive for back pain. Negative for myalgias.  Neurological: Negative for weakness and headaches.  All other systems reviewed and are negative.      Objective:   Physical Exam Vitals reviewed.  Constitutional:      General: She is not in acute distress.    Appearance: She is well-developed. She is obese.  HENT:     Head: Normocephalic and atraumatic.     Right Ear: Tympanic membrane normal.     Left Ear: Tympanic membrane normal.  Eyes:     Pupils: Pupils are equal, round, and  reactive to light.  Neck:     Thyroid: No thyromegaly.  Cardiovascular:     Rate and Rhythm: Normal rate and regular rhythm.     Heart sounds: Normal heart sounds. No murmur heard.   Pulmonary:     Effort: Pulmonary effort is normal. No respiratory distress.     Breath sounds: Rhonchi present. No wheezing.     Comments: Coarse nonproductive cough Abdominal:     General: Bowel sounds are normal. There is no distension.     Palpations: Abdomen is soft.     Tenderness: There is no abdominal tenderness.  Musculoskeletal:        General: No tenderness. Normal range of motion.     Cervical back: Normal range of motion and neck supple.  Skin:    General: Skin is warm and dry.  Neurological:     Mental Status: She is alert and oriented to person, place, and time.     Cranial Nerves: No cranial nerve deficit.     Deep Tendon Reflexes: Reflexes are normal and symmetric.  Psychiatric:        Behavior: Behavior normal.        Thought Content: Thought content normal.        Judgment: Judgment normal.       BP 135/84   Pulse 90   Temp (!) 97 F (36.1 C) (Temporal)   Ht 4\' 11"  (1.499  m)   Wt 220 lb (99.8 kg)   SpO2 93%   BMI 44.43 kg/m      Assessment & Plan:  Sheila [name] comes in today with chief complaint of Medication Problem (DISCUSS TRAMADOL WANTS 3 TIMES A DAY ), DISORENTED , Bleeding/Bruising (LEFT EAR ), and Cough   Diagnosis and orders addressed:  1. Acute left-sided low back pain without sciatica - Urinalysis, Complete - Urine Culture  2. COPD exacerbation (HCC) Rest Force fluids Mucinex as needed  - doxycycline (VIBRA-TABS) 100 MG tablet; Take 1 tablet (100 mg total) by mouth 2 (two) times daily.  Dispense: 20 tablet; Refill: 0 - predniSONE (STERAPRED UNI-PAK 21 TAB) 10 MG (21) TBPK tablet; Use as directed  Dispense: 21 tablet; Refill: 0 - DG Chest 2 View; Future - cefTRIAXone (ROCEPHIN) injection 1 g  3. Chronic bilateral back pain, unspecified back  location  4. Acute cystitis with hematuria - cefTRIAXone (ROCEPHIN) injection 1 g   Health Maintenance reviewed Diet and exercise encouraged  Follow up plan: 1 week to recheck, red flags discussed to go to ED   [name], FNP

## 2020-07-01 ENCOUNTER — Other Ambulatory Visit: Payer: Self-pay | Admitting: Family

## 2020-07-01 DIAGNOSIS — F331 Major depressive disorder, recurrent, moderate: Secondary | ICD-10-CM

## 2020-07-01 DIAGNOSIS — F411 Generalized anxiety disorder: Secondary | ICD-10-CM

## 2020-07-02 ENCOUNTER — Ambulatory Visit (INDEPENDENT_AMBULATORY_CARE_PROVIDER_SITE_OTHER): Payer: Medicare Other | Admitting: Family

## 2020-07-02 ENCOUNTER — Encounter: Payer: Self-pay | Admitting: Family

## 2020-07-02 ENCOUNTER — Other Ambulatory Visit: Payer: Self-pay

## 2020-07-02 VITALS — BP 142/76 | HR 52 | Temp 97.6°F | Ht 59.0 in | Wt 216.8 lb

## 2020-07-02 DIAGNOSIS — J441 Chronic obstructive pulmonary disease with (acute) exacerbation: Secondary | ICD-10-CM | POA: Diagnosis not present

## 2020-07-02 DIAGNOSIS — R109 Unspecified abdominal pain: Secondary | ICD-10-CM | POA: Diagnosis not present

## 2020-07-02 DIAGNOSIS — N3001 Acute cystitis with hematuria: Secondary | ICD-10-CM

## 2020-07-02 MED ORDER — CEFTRIAXONE SODIUM 1 G IJ SOLR
1.0000 g | Freq: Once | INTRAMUSCULAR | Status: AC
Start: 2020-07-02 — End: 2020-07-02
  Administered 2020-07-02: 1 g via INTRAMUSCULAR

## 2020-07-02 NOTE — Progress Notes (Signed)
Subjective:    Patient ID: SHANDRA SZYMBORSKI, female    DOB: May 07, 1940, 80 y.o.   MRN: [id number]  Chief Complaint  Patient presents with  . Follow-up   Pt presents to the office today to recheck back pain, UTI, and COPD exacerbation. She was given Rocephin 1 g, doxycyline 100 mg BID for 10 days, and prednisone dose pack.   She reports she is feeling slightly better. She is using rolling walker instead of wheelchair today. Her breathing is improved.   She reports she continues to have lower back pain.    Back Pain This is a chronic problem. The current episode started more than 1 year ago. The problem occurs intermittently. The problem has been waxing and waning since onset. The pain is present in the lumbar spine. The quality of the pain is described as aching. The pain is at a severity of 3/10. The pain is moderate. Associated symptoms include weakness. Pertinent negatives include no leg pain. She has tried analgesics and NSAIDs for the symptoms. The treatment provided mild relief.      Review of Systems  Constitutional: Positive for fatigue.  Musculoskeletal: Positive for back pain.  Neurological: Positive for weakness.  All other systems reviewed and are negative.      Objective:   Physical Exam Vitals reviewed.  Constitutional:      General: She is not in acute distress.    Appearance: She is well-developed.  HENT:     Head: Normocephalic and atraumatic.     Right Ear: Tympanic membrane normal.     Left Ear: Tympanic membrane normal.  Eyes:     Pupils: Pupils are equal, round, and reactive to light.  Neck:     Thyroid: No thyromegaly.  Cardiovascular:     Rate and Rhythm: Normal rate. Rhythm irregular.     Heart sounds: Normal heart sounds. No murmur heard.   Pulmonary:     Effort: Pulmonary effort is normal. No respiratory distress.     Breath sounds: Wheezing present.  Abdominal:     General: Bowel sounds are normal. There is no distension.     Palpations:  Abdomen is soft.     Tenderness: There is no abdominal tenderness. There is right CVA tenderness.  Musculoskeletal:        General: No tenderness.     Cervical back: Normal range of motion and neck supple.     Right lower leg: Edema (2+) present.     Left lower leg: Edema (2+) present.     Comments: Pain in lumbar  Skin:    General: Skin is warm and dry.  Neurological:     Mental Status: She is alert and oriented to person, place, and time.     Cranial Nerves: No cranial nerve deficit.     Motor: Weakness present.     Gait: Gait abnormal (using rolling walker).     Deep Tendon Reflexes: Reflexes are normal and symmetric.  Psychiatric:        Behavior: Behavior normal.        Thought Content: Thought content normal.        Judgment: Judgment normal.       BP (!) 142/76   Pulse (!) 52   Temp 97.6 F (36.4 C) (Temporal)   Ht 4' 11" (1.499 m)   Wt 216 lb 12.8 oz (98.3 kg)   BMI 43.79 kg/m      Assessment & Plan:  AYVEN GLASCO comes in today  with chief complaint of Follow-up   Diagnosis and orders addressed:  1. Flank pain - CBC with Differential/Platelet - BMP8+EGFR - cefTRIAXone (ROCEPHIN) injection 1 g  2. Acute cystitis with hematuria - CBC with Differential/Platelet - BMP8+EGFR - cefTRIAXone (ROCEPHIN) injection 1 g  3. COPD exacerbation (HCC) - CBC with Differential/Platelet - BMP8+EGFR - cefTRIAXone (ROCEPHIN) injection 1 g   Pt greatly improved from last visit.  Force fluids Will give rocephin, given still having CVA tenderness.  Continue doxycyline, after completion will bring urine to recheck.  Labs pending   [name], FNP

## 2020-07-02 NOTE — Patient Instructions (Signed)
Flank Pain, Adult Flank pain is pain that is located on the side of the body between the upper abdomen and the back. This area is called the flank. The pain may occur over a short period of time (acute), or it may be long-term or recurring (chronic). It may be mild or severe. Flank pain can be caused by many things, including:  Muscle soreness or injury.  Kidney stones or kidney disease.  Stress.  A disease of the spine (vertebral disk disease).  A lung infection (pneumonia).  Fluid around the lungs (pulmonary edema).  A skin rash caused by the chickenpox virus (shingles).  Tumors that affect the back of the abdomen.  Gallbladder disease. Follow these instructions at home:  Drink enough fluid to keep your urine clear or pale yellow.  Rest as told by your health care provider.  Take over-the-counter and prescription medicines only as told by your health care provider.  Keep a journal to track what has caused your flank pain and what has made it feel better.  Keep all follow-up visits as told by your health care provider. This is important.   Contact a health care provider if:  Your pain is not controlled with medicine.  You have new symptoms.  Your pain gets worse.  You have a fever.  Your symptoms last longer than 2-3 days.  You have trouble urinating or you are urinating very frequently. Get help right away if:  You have trouble breathing or you are short of breath.  Your abdomen hurts or it is swollen or red.  You have nausea or vomiting.  You feel faint or you pass out.  You have blood in your urine. Summary  Flank pain is pain that is located on the side of the body between the upper abdomen and the back.  The pain may occur over a short period of time (acute), or it may be long-term or recurring (chronic). It may be mild or severe.  Flank pain can be caused by many things.  Contact your health care provider if your symptoms get worse or they last  longer than 2-3 days. This information is not intended to replace advice given to you by your health care provider. Make sure you discuss any questions you have with your health care provider. Document Revised: 11/03/2019 Document Reviewed: 11/03/2019 Elsevier Patient Education  2021 [employer]  

## 2020-07-03 LAB — CBC WITH DIFFERENTIAL/PLATELET
Basophils Absolute: 0.1 10*3/uL (ref 0.0–0.2)
Basos: 1 %
EOS (ABSOLUTE): 0.1 10*3/uL (ref 0.0–0.4)
Eos: 0 %
Hematocrit: 42.6 % (ref 34.0–46.6)
Hemoglobin: 14.8 g/dL (ref 11.1–15.9)
Immature Grans (Abs): 0.6 10*3/uL — ABNORMAL HIGH (ref 0.0–0.1)
Immature Granulocytes: 3 %
Lymphocytes Absolute: 3.8 10*3/uL — ABNORMAL HIGH (ref 0.7–3.1)
Lymphs: 20 %
MCH: 30.7 pg (ref 26.6–33.0)
MCHC: 34.7 g/dL (ref 31.5–35.7)
MCV: 88 fL (ref 79–97)
Monocytes Absolute: 1 10*3/uL — ABNORMAL HIGH (ref 0.1–0.9)
Monocytes: 5 %
Neutrophils Absolute: 13.7 10*3/uL — ABNORMAL HIGH (ref 1.4–7.0)
Neutrophils: 71 %
Platelets: 619 10*3/uL — ABNORMAL HIGH (ref 150–450)
RBC: 4.82 x10E6/uL (ref 3.77–5.28)
RDW: 12.7 % (ref 11.7–15.4)
WBC: 19.3 10*3/uL — ABNORMAL HIGH (ref 3.4–10.8)

## 2020-07-03 LAB — BMP8+EGFR
BUN/Creatinine Ratio: 27 (ref 12–28)
BUN: 29 mg/dL — ABNORMAL HIGH (ref 8–27)
CO2: 17 mmol/L — ABNORMAL LOW (ref 20–29)
Calcium: 11.8 mg/dL — ABNORMAL HIGH (ref 8.7–10.3)
Chloride: 101 mmol/L (ref 96–106)
Creatinine, Ser: 1.09 mg/dL — ABNORMAL HIGH (ref 0.57–1.00)
Glucose: 117 mg/dL — ABNORMAL HIGH (ref 65–99)
Potassium: 5.2 mmol/L (ref 3.5–5.2)
Sodium: 139 mmol/L (ref 134–144)
eGFR: 52 mL/min/{1.73_m2} — ABNORMAL LOW (ref >59)

## 2020-07-04 ENCOUNTER — Other Ambulatory Visit: Payer: Self-pay | Admitting: Family

## 2020-07-04 MED ORDER — CEPHALEXIN 500 MG PO CAPS: 500.0000 mg | ORAL_CAPSULE | Freq: Two times a day (BID) | ORAL | 0 refills | Status: DC

## 2020-07-05 LAB — URINE CULTURE

## 2020-07-08 ENCOUNTER — Telehealth: Payer: Self-pay

## 2020-07-08 NOTE — Telephone Encounter (Signed)
  Sheila [name] 16-Aug-2040 is going to finish her antibiotic on Thursday for her uti and she said she was suppose to come back and get retested. when should she do that

## 2020-07-09 NOTE — Telephone Encounter (Signed)
Patient aware and verbalized understanding. °

## 2020-07-09 NOTE — Telephone Encounter (Signed)
She can come on Monday.

## 2020-07-10 ENCOUNTER — Other Ambulatory Visit: Payer: Medicare Other | Admitting: Nurse Practitioner

## 2020-07-10 ENCOUNTER — Telehealth: Payer: Self-pay | Admitting: Cardiovascular Disease

## 2020-07-10 ENCOUNTER — Other Ambulatory Visit: Payer: Self-pay

## 2020-07-10 VITALS — HR 106 | Resp 18

## 2020-07-10 DIAGNOSIS — G894 Chronic pain syndrome: Secondary | ICD-10-CM | POA: Diagnosis not present

## 2020-07-10 DIAGNOSIS — R079 Chest pain, unspecified: Secondary | ICD-10-CM | POA: Diagnosis not present

## 2020-07-10 NOTE — Telephone Encounter (Signed)
Called patient, spoke to daughter with okay from patient.  She states that her mother has been having chest pain on and off for a while- last office visit from [facility] they stated they mentioned this, but patient daughter states it is becoming more frequent. The Xanax and pain killers do help some with the chest pain, but states they may need something else than this medication. Patient is on Palliative care, see recent note in the chart from visit today. BP/HR are okay per daughter. No new swelling and no weight gain. I did advise I would route to MD to advise, there are no upcoming appointments to use, but they requested it be seen by Dr.Berry to advise of anything different.

## 2020-07-10 NOTE — Progress Notes (Signed)
[facility] Palliative Care Consult Note Telephone: [phone]  Fax: [phone]    Date of encounter: 07/10/20 PATIENT NAME: Sheila [name] [address] [place] [address]   [phone] (home)  DOB: 10/16/1940 MRN: [id number]  PRIMARY CARE PROVIDER:    Sharion Balloon, FNP,  [address] [place] [address] [phone]  REFERRING PROVIDER:   Sharion [name], [place],  [place] [address] [phone]  RESPONSIBLE PARTY:    Contact Information    Name Relation Home Work [place] Niece [phone]  [phone]   Sheila [name] Daughter [phone]  [phone]   [facility] Daughter   [phone]   Sheila [name] Granddaughter   [phone]     I met face to face with patient and family in home. Palliative Care was asked to follow this patient by consultation request of  Sheila Balloon, FNP to address advance care planning and complex medical decision making. This is a follow up visit.                                   ASSESSMENT AND PLAN / RECOMMENDATIONS:   Advance Care Planning/Goals of Care:  CODE STATUS: DNR Goal of Care: Goal of care is comfort while preserving function. Directives:Patient has a signed DNR and MOST form in home, [facility] EMR. [place] the MOST form include,limited additional interventions, determine use or limitation of antibiotics when infection occurs, IV fluids if indicated, feeding tube long term if indicated.  Symptom Management/Plan: Chest pain: Patient denied chest pain today, was last seen by cardiology 2 months ago. Patient currently in no acute distress. Phone call made to her cardiologist office Dr. Gwenlyn [name]. Spoke with office staff Sheila [name], message left for the triage nurse regarding patient's symptoms. Was told patient will be called with further instructions.  Urinary tract infection: Patient tolerating antibiotics, encouraged to  complete antibiotics as prescribed. Discussed need for adequate fluid intake and proper peri care. Patient verbalized understanding. Chronic pain syndrome: Continue Tramadol 5m by mouth every 8hrs as needed. Patient denied constipation.  Follow up Palliative Care Visit: Palliative care will continue to follow for complex medical decision making, advance care planning, and clarification of goals. Return in about 4 weeks or prn.  PPS: 50%  HOSPICE ELIGIBILITY/DIAGNOSIS: TBD  CHIEF COMPLAIN: middle of chest pain  History obtained from review of EMR, discussion with primary team, and interview with family, facility staff/caregiver and/or Sheila [name].   HISTORY OF PRESENT ILLNESS:  Sheila GILLELANDis a 80y.o. year old female  with complaints of intermittent chest pain. Patient report symptoms is chronic but has been occurring more frequently in the last month. Patient described the pain as sharp and non-radiating in her med chest. Report pain occurs any time of the day, not related to any particular activity. She report nothing makes it worse, taking 2 tablets of Xanax and one Tramadol relieves the pain. Last occurrence of the chest pain was 4 days ago. Report chest pain not associated with shortness of breath, or syncope. Family report patient with complaints of occasional nausea, dizziness and headaches. Ten systems reviewed and are negative for acute change, except as noted in the HPI. Patient currently on antibiotics for urinary tract infection. Patient's other medical problems includingheart failure (EF 20-20%),COPD, anxiety (on xanax), Chronic pain synfrome, DJD  Reviewed labs 07/02/2020 Na 139, K  5.2, Cr 1.09, GFR 52, Hgb 14.8, Hct 42.6, Plt 619 Reviewed chest x-ray completed on 06/28/2020 Showed Chronic lung changes without evidence of acute cardiopulmonary disease.  I reviewed available labs, medications, imaging, studies and related documents from the EMR.  Records reviewed and summarized  above.   Physical Exam: General: well appearing today,morbidly obese, sitting in ra ecliner in NAD EYES: anicteric sclera, no discharge  ENMT: intact hearing, oral mucous membranes moist CV: no LE edema Pulmonary: no increased work of breathing,nocough, no wheezing,room air Abdomen: no ascites GU: deferred MSK: ambulatory Skin: warm and dry, no rashes or wounds on visible skin Neuro: Generalized weakness,A and O x 3 Psych: non-anxious affecttoday Hem/lymph/immuno: no widespread bruising  Past Medical History:  Diagnosis Date  . Allergy   . Bell's palsy   . Cancer ([place])    skin CA on face  . Cataract   . Chest pain 07/2009   Myoveiw stress test negative.  . Depression   . Diverticulosis of colon   . DJD (degenerative joint disease) of knee   . Dyspnea    on exertion  . Family history of adverse reaction to anesthesia    sister ha nausea/vomiting  . Gastric ulcer with hemorrhage 01/14/2011  . GERD (gastroesophageal reflux disease)   . Headache   . History of fractured pelvis   . Hypercholesteremia   . Hypertension   . Hypothyroidism   . LV dysfunction   . UTI (lower urinary tract infection)    Current Outpatient Medications on File Prior to Visit  Medication Sig Dispense Refill  . cephALEXin (KEFLEX) 500 MG capsule Take 1 capsule (500 mg total) by mouth 2 (two) times daily. 14 capsule 0  . albuterol (PROVENTIL) (2.5 MG/3ML) 0.083% nebulizer solution NEBULIZE 1 VIAL EVERY 6-8 HOURS AS NEEDED FOR WHEEZING OR SHORTNESS OF BREATH 180 mL 1  . ALPRAZolam (XANAX) 0.5 MG tablet TAKE (1) TABLET TWICE A DAY. 60 tablet 2  . Cholecalciferol (VITAMIN D3) 25 MCG (1000 UT) CAPS Take 1,000 Units by mouth daily.     . cinacalcet (SENSIPAR) 30 MG tablet Take 1 tablet (30 mg total) by mouth every other day. 60 tablet 2  . clotrimazole-betamethasone (LOTRISONE) cream APPLY TO AFFECTED AREAS TWICE A DAY 30 g 0  . diclofenac sodium (VOLTAREN) 1 % GEL Apply 2 g topically 4 (four)  times daily. (Patient taking differently: Apply 2 g topically 4 (four) times daily as needed (pain).) 100 g 0  . doxycycline (VIBRA-TABS) 100 MG tablet Take 1 tablet (100 mg total) by mouth 2 (two) times daily. 20 tablet 0  . DULoxetine (CYMBALTA) 60 MG capsule TAKE 1 CAPUSLE ONCE DAILY 90 capsule 0  . ENTRESTO 49-51 MG TAKE 1 TABLET 2 TIMES A DAY 60 tablet 11  . fluticasone (FLONASE) 50 MCG/ACT nasal spray PLACE 2 SPRAYS INTO BOTH NOSTRILS DAILY 16 g 2  . fluticasone furoate-vilanterol (BREO ELLIPTA) 100-25 MCG/INH AEPB Inhale 1 puff into the lungs daily. 1 each 11  . gabapentin (NEURONTIN) 300 MG capsule TAKE (1) CAPSULE THREE TIMES DAILY. 270 capsule 0  . gabapentin (NEURONTIN) 300 MG capsule TAKE (1) CAPSULE THREE TIMES DAILY. 270 capsule 0  . Garlic 10 MG CAPS Take by mouth.    . levocetirizine (XYZAL) 5 MG tablet TAKE 1 TABLET ONCE DAILY IN THE EVENING 30 tablet 5  . levothyroxine (SYNTHROID) 88 MCG tablet Take 1 tablet (88 mcg total) by mouth daily. 90 tablet 1  . meclizine (ANTIVERT) 25 MG tablet Take  1 tablet (25 mg total) by mouth 3 (three) times daily as needed for dizziness. 90 tablet 3  . Multiple Vitamin (MULTIVITAMIN WITH MINERALS) TABS tablet Take 1 tablet by mouth daily.    . Omega-3 Fatty Acids (FISH OIL OMEGA-3 PO) Take 1 capsule by mouth every evening.     . pantoprazole (PROTONIX) 20 MG tablet Take 1 tablet (20 mg total) by mouth at bedtime. 90 tablet 2  . Pitavastatin Calcium (LIVALO) 4 MG TABS Take 1 tablet (4 mg total) by mouth daily. 90 tablet 1  . potassium chloride SA (KLOR-CON) 20 MEQ tablet TAKE (1) TABLET TWICE A DAY. 180 tablet 0  . predniSONE (STERAPRED UNI-PAK 21 TAB) 10 MG (21) TBPK tablet Use as directed 21 tablet 0  . spironolactone (ALDACTONE) 25 MG tablet TAKE 1 TABLET ONCE DAILY 30 tablet 11  . Tetrahydrozoline HCl (VISINE OP) Place 1 drop into both eyes daily as needed (dry eyes/itching).    . torsemide (DEMADEX) 20 MG tablet Take 2 tablets (40 mg total) by  mouth daily. 60 tablet 5  . traMADol (ULTRAM) 50 MG tablet Take 2 tablets (100 mg total) by mouth every 12 (twelve) hours as needed. 120 tablet 2  . triamcinolone ointment (KENALOG) 0.5 % APPLY TO AFFECTED AREAS TWICE A DAY 30 g 1   No current facility-administered medications on file prior to visit.   Thank you for the opportunity to participate in the care of Ms. Caplin.  The palliative care team will continue to follow. Please call our office at [phone] if we can be of additional assistance.   Jari Favre, DNP, AGPCNP-BC  COVID-19 PATIENT SCREENING TOOL Asked and negative response unless otherwise noted:   Have you had symptoms of covid, tested positive or been in contact with someone with symptoms/positive test in the past 5-10 days?

## 2020-07-10 NOTE — Telephone Encounter (Signed)
Pt c/o of Chest Pain: STAT if CP now or developed within 24 hours  1. Are you having CP right now? no  2. Are you experiencing any other symptoms (ex. SOB, nausea, vomiting, sweating)? Nausea, dizziness, headaches  3. How long have you been experiencing CP? Lately its been getting worse  4. Is your CP continuous or coming and going? Coming and going  5. Have you taken Nitroglycerin? No, patient does have any. ?

## 2020-07-12 NOTE — Telephone Encounter (Signed)
Called patient daughter. Advised of message from MD.  Patient daughter verbalized understanding.

## 2020-07-15 ENCOUNTER — Other Ambulatory Visit: Payer: Medicare Other

## 2020-07-15 DIAGNOSIS — R10A0 Flank pain, unspecified side: Secondary | ICD-10-CM

## 2020-07-15 DIAGNOSIS — N3001 Acute cystitis with hematuria: Secondary | ICD-10-CM | POA: Diagnosis not present

## 2020-07-15 DIAGNOSIS — R109 Unspecified abdominal pain: Secondary | ICD-10-CM | POA: Diagnosis not present

## 2020-07-17 LAB — URINALYSIS, COMPLETE
Bilirubin, UA: NEGATIVE
Glucose, UA: NEGATIVE
Ketones, UA: NEGATIVE
Nitrite, UA: NEGATIVE
Protein,UA: NEGATIVE
RBC, UA: NEGATIVE
Specific Gravity, UA: 1.015 (ref 1.005–1.030)
Urobilinogen, Ur: 0.2 mg/dL (ref 0.2–1.0)
pH, UA: 5.5 (ref 5.0–7.5)

## 2020-07-17 LAB — MICROSCOPIC EXAMINATION

## 2020-07-19 ENCOUNTER — Telehealth: Payer: Self-pay | Admitting: Family

## 2020-07-19 LAB — URINE CULTURE

## 2020-07-19 NOTE — Telephone Encounter (Signed)
Please check culture

## 2020-07-19 NOTE — Telephone Encounter (Signed)
Pt wants to know results of urine

## 2020-07-24 DIAGNOSIS — M1990 Unspecified osteoarthritis, unspecified site: Secondary | ICD-10-CM | POA: Diagnosis not present

## 2020-07-24 NOTE — Telephone Encounter (Signed)
Please call patients daughter Caryl Fate) to give culture results.  [phone]

## 2020-07-24 NOTE — Telephone Encounter (Signed)
Culture negative

## 2020-07-31 ENCOUNTER — Other Ambulatory Visit: Payer: Self-pay | Admitting: Cardiology

## 2020-08-06 ENCOUNTER — Telehealth: Payer: Medicare Other | Admitting: Nurse Practitioner

## 2020-08-07 ENCOUNTER — Other Ambulatory Visit: Payer: Self-pay

## 2020-08-07 ENCOUNTER — Other Ambulatory Visit: Payer: Medicare Other

## 2020-08-09 LAB — PTH, INTACT AND CALCIUM
Calcium: 10.4 mg/dL — ABNORMAL HIGH (ref 8.7–10.3)
PTH: 81 pg/mL — ABNORMAL HIGH (ref 15–65)

## 2020-08-13 ENCOUNTER — Encounter: Payer: Self-pay | Admitting: Nurse Practitioner

## 2020-08-13 ENCOUNTER — Telehealth (INDEPENDENT_AMBULATORY_CARE_PROVIDER_SITE_OTHER): Payer: Medicare Other | Admitting: Nurse Practitioner

## 2020-08-13 ENCOUNTER — Other Ambulatory Visit: Payer: Self-pay

## 2020-08-13 NOTE — Progress Notes (Signed)
Endocrinology Follow Up Note       08/13/2020, 1:34 PM     TELEHEALTH VISIT: The patient is being engaged in telehealth visit.  This type of visit limits physical examination significantly, and thus is not preferable over face-to-face encounters.  I connected with  [name] on 08/13/20 by a video enabled telemedicine application and verified that I am speaking with the correct person using two identifiers.   I discussed the limitations of evaluation and management by telemedicine. The patient expressed understanding and agreed to proceed.    The participants involved in this visit include: Brita Romp, NP located at [facility] and [name] and her aide Jennefer Bravo, located at their personal residence listed.     SUBJECTIVE:  Sheila [name] is a 80 y.o.-year-old female, referred by her  Sharion Balloon, FNP  , for evaluation for hypercalcemia/hyperparathyroidism.   Past Medical History:  Diagnosis Date   Allergy    Bell's palsy    Cancer ([place])    skin CA on face   Cataract    Chest pain 07/2009   Myoveiw stress test negative.   Depression    Diverticulosis of colon    DJD (degenerative joint disease) of knee    Dyspnea    on exertion   Family history of adverse reaction to anesthesia    sister ha nausea/vomiting   Gastric ulcer with hemorrhage 01/14/2011   GERD (gastroesophageal reflux disease)    Headache    History of fractured pelvis    Hypercholesteremia    Hypertension    Hypothyroidism    LV dysfunction    UTI (lower urinary tract infection)     Past Surgical History:  Procedure Laterality Date   APPENDECTOMY     BREAST BIOPSY Left    ESOPHAGOGASTRODUODENOSCOPY  01/14/2011   Procedure: ESOPHAGOGASTRODUODENOSCOPY (EGD);  Surgeon: Rogene Houston, MD;  Location: [facility];  Service: Endoscopy;  Laterality: N/A;   HIP SURGERY     pelvis   JOINT REPLACEMENT      KNEE SURGERY  04/2010.   Total left knee replacement   LAPAROSCOPIC APPENDECTOMY N/A 08/01/2013   Procedure: APPENDECTOMY LAPAROSCOPIC;  Surgeon: Jamesetta So, MD;  Location: [facility] ORS;  Service: General;  Laterality: N/A;   RIGHT/LEFT HEART CATH AND CORONARY ANGIOGRAPHY N/A 10/02/2019   Procedure: RIGHT/LEFT HEART CATH AND CORONARY ANGIOGRAPHY;  Surgeon: Lorretta Harp, MD;  Location: [facility];  Service: Cardiovascular;  Laterality: N/A;    Social History   Tobacco Use   Smoking status: Former    Pack years: 0.00    Types: Cigarettes    Quit date: 02/24/1968    Years since quitting: 52.5   Smokeless tobacco: Current    Types: Snuff   Tobacco comments:    quit yrs and yrs ago when she was very young  Media planner   Vaping Use: Never used  Substance Use Topics   Alcohol use: No   Drug use: No    Family History  Problem Relation Age of Onset   Aneurysm Mother    Stroke Mother    Hypertension  Mother    Cancer Father        lung   Heart disease Sister    Hip fracture Sister    Cancer Brother    Alzheimer's disease Sister    Cancer Sister        skin, uterine   Cancer Brother     Outpatient Encounter Medications as of 08/13/2020  Medication Sig   cephALEXin (KEFLEX) 500 MG capsule Take 1 capsule (500 mg total) by mouth 2 (two) times daily.   albuterol (PROVENTIL) (2.5 MG/3ML) 0.083% nebulizer solution NEBULIZE 1 VIAL EVERY 6-8 HOURS AS NEEDED FOR WHEEZING OR SHORTNESS OF BREATH   ALPRAZolam (XANAX) 0.5 MG tablet TAKE (1) TABLET TWICE A DAY.   Cholecalciferol (VITAMIN D3) 25 MCG (1000 UT) CAPS Take 1,000 Units by mouth daily.    cinacalcet (SENSIPAR) 30 MG tablet Take 1 tablet (30 mg total) by mouth every other day.   clotrimazole-betamethasone (LOTRISONE) cream APPLY TO AFFECTED AREAS TWICE A DAY   diclofenac sodium (VOLTAREN) 1 % GEL Apply 2 g topically 4 (four) times daily. (Patient taking differently: Apply 2 g topically 4 (four) times daily as needed (pain).)    DULoxetine (CYMBALTA) 60 MG capsule TAKE 1 CAPUSLE ONCE DAILY   ENTRESTO 49-51 MG TAKE 1 TABLET 2 TIMES A DAY   fluticasone (FLONASE) 50 MCG/ACT nasal spray PLACE 2 SPRAYS INTO BOTH NOSTRILS DAILY   fluticasone furoate-vilanterol (BREO ELLIPTA) 100-25 MCG/INH AEPB Inhale 1 puff into the lungs daily.   gabapentin (NEURONTIN) 300 MG capsule TAKE (1) CAPSULE THREE TIMES DAILY.   gabapentin (NEURONTIN) 300 MG capsule TAKE (1) CAPSULE THREE TIMES DAILY.   Garlic 10 MG CAPS Take by mouth.   levocetirizine (XYZAL) 5 MG tablet TAKE 1 TABLET ONCE DAILY IN THE EVENING   levothyroxine (SYNTHROID) 88 MCG tablet Take 1 tablet (88 mcg total) by mouth daily.   meclizine (ANTIVERT) 25 MG tablet Take 1 tablet (25 mg total) by mouth 3 (three) times daily as needed for dizziness.   Multiple Vitamin (MULTIVITAMIN WITH MINERALS) TABS tablet Take 1 tablet by mouth daily.   Omega-3 Fatty Acids (FISH OIL OMEGA-3 PO) Take 1 capsule by mouth every evening.    pantoprazole (PROTONIX) 20 MG tablet Take 1 tablet (20 mg total) by mouth at bedtime.   Pitavastatin Calcium (LIVALO) 4 MG TABS Take 1 tablet (4 mg total) by mouth daily.   potassium chloride SA (KLOR-CON) 20 MEQ tablet TAKE (1) TABLET TWICE A DAY.   predniSONE (STERAPRED UNI-PAK 21 TAB) 10 MG (21) TBPK tablet Use as directed   spironolactone (ALDACTONE) 25 MG tablet TAKE 1 TABLET ONCE DAILY   Tetrahydrozoline HCl (VISINE OP) Place 1 drop into both eyes daily as needed (dry eyes/itching).   torsemide (DEMADEX) 20 MG tablet TAKE 2 TABLETS DAILY AS DIRECTED   traMADol (ULTRAM) 50 MG tablet Take 2 tablets (100 mg total) by mouth every 12 (twelve) hours as needed.   triamcinolone ointment (KENALOG) 0.5 % APPLY TO AFFECTED AREAS TWICE A DAY   [DISCONTINUED] doxycycline (VIBRA-TABS) 100 MG tablet Take 1 tablet (100 mg total) by mouth 2 (two) times daily.   No facility-administered encounter medications on file as of 08/13/2020.    Allergies  Allergen Reactions    Ibuprofen Other (See Comments)    History of bleeding ulcers - contra-indicated    Benadryl [Diphenhydramine Hcl] Other (See Comments)    Worsening depression    Lipitor [Atorvastatin] Other (See Comments)    Body aches.  Baclofen Other (See Comments)    Loopy    Hydrocodone Nausea And Vomiting   Codeine Nausea And Vomiting   Morphine And Related Nausea And Vomiting   Tdap [Tetanus-Diphth-Acell Pertussis] Swelling    Redness and localized swelling at injection site      HPI  DASHEA MCMULLAN was diagnosed with hypercalcemia in 2014 (according to chart review, this was the first incidence noted).  Patient has no previously known history of parathyroid, pituitary, adrenal dysfunctions; no family history of such dysfunctions. -Review of her referral package of most recent labs reveals calcium of 10.8 the corresponding PTH of 116 on 04/01/20.  I reviewed pt's DEXA scans: 2019 Her T score was -2.2 (considered osteopenic)  No prior history of fragility fractures or falls. No history of kidney stones.  Has History of CKD (stage 3a). Last BUN/Cr: 26/1.01  she is not on HCTZ or other thiazide therapy.  She does have a history of vitamin D deficiency. Last vitamin D level was 20.9 in 2017.  she is not on calcium supplements (is on a MVI for women over 50),  she eats dairy and green, leafy, vegetables on average amounts.  She does drink milk in excess according to her niece who is with her today (drinks 3 gallons of milk per week).  she does not have a family history of hypercalcemia, pituitary tumors, thyroid cancer, or osteoporosis.   I reviewed her chart and she also has a history of CHF, COPD, PUD, HLD, HTN, and chronic pain.  Recently, palliative care has gotten involved with her care.    Review of systems  Constitutional: + Minimally fluctuating body weight,  current There is no height or weight on file to calculate BMI. , no fatigue, no subjective hyperthermia, no subjective  hypothermia Eyes: no blurry vision, no xerophthalmia ENT: no sore throat, no nodules palpated in throat, no dysphagia/odynophagia, no hoarseness Cardiovascular: no chest pain, + shortness of breath with exertion (history of CHF), no palpitations, no leg swelling Respiratory: no cough, + shortness of breath with exertion Gastrointestinal: no nausea/vomiting/diarrhea Musculoskeletal: no muscle/joint aches, walks with walker due to deconditioning Skin: no rashes, no hyperemia Neurological: + mild resting tremors, no numbness, no tingling, no dizziness Psychiatric: no depression, no anxiety  -------------------------------------------------------------------------------------------------------------------------------- OBJECTIVE:  There were no vitals taken for this visit., There is no height or weight on file to calculate BMI. Wt Readings from Last 3 Encounters:  07/02/20 216 lb 12.8 oz (98.3 kg)  06/28/20 220 lb (99.8 kg)  06/17/20 220 lb (99.8 kg)    BP Readings from Last 3 Encounters:  07/02/20 (!) 142/76  06/28/20 135/84  06/26/20 133/81    Physical Exam- Telehealth- significantly limited due to nature of visit  Constitutional: There is no height or weight on file to calculate BMI. , not in acute distress, normal state of mind Respiratory: Adequate breathing efforts   CMP ( most recent) CMP     Component Value Date/Time   NA 139 07/02/2020 1254   K 5.2 07/02/2020 1254   CL 101 07/02/2020 1254   CO2 17 (L) 07/02/2020 1254   GLUCOSE 117 (H) 07/02/2020 1254   GLUCOSE 129 (H) 10/24/2019 2110   BUN 29 (H) 07/02/2020 1254   CREATININE 1.09 (H) 07/02/2020 1254   CREATININE 0.70 07/28/2012 1248   CALCIUM 10.4 (H) 08/07/2020 1009   PROT 7.9 06/17/2020 1502   ALBUMIN 4.3 06/17/2020 1502   AST 16 06/17/2020 1502   ALT 9 06/17/2020 1502  ALKPHOS 67 06/17/2020 1502   BILITOT 1.0 06/17/2020 1502   GFRNONAA 53 (L) 03/25/2020 1504   GFRNONAA 87 07/28/2012 1248   GFRAA 62  03/25/2020 1504   GFRAA >89 07/28/2012 1248     Diabetic Labs (most recent): Lab Results  Component Value Date   HGBA1C 5.6 12/15/2016   HGBA1C 4.9% 11/15/2012     Lipid Panel ( most recent) Lipid Panel     Component Value Date/Time   CHOL 163 07/26/2019 1454   CHOL 154 07/28/2012 1248   TRIG 200 (H) 07/26/2019 1454   TRIG 142 05/11/2014 1559   TRIG 120 07/28/2012 1248   HDL 43 07/26/2019 1454   HDL 45 05/11/2014 1559   HDL 44 07/28/2012 1248   CHOLHDL 3.8 07/26/2019 1454   LDLCALC 86 07/26/2019 1454   LDLCALC 48 11/15/2012 1255   LDLCALC 86 07/28/2012 1248   LABVLDL 34 07/26/2019 1454      Lab Results  Component Value Date   TSH 1.180 06/17/2020   TSH 0.270 (L) 03/25/2020   TSH 8.280 (H) 07/26/2019   TSH 3.610 10/13/2018   TSH 4.410 05/03/2018   TSH 5.110 (H) 04/05/2018   TSH 2.430 06/08/2017   TSH 1.660 12/15/2016   TSH 0.886 08/13/2016   TSH 1.330 05/11/2016   FREET4 1.01 01/13/2011     Results for KASSADY, LABOY (MRN [id number]) as of 05/02/2020 14:48  Ref. Range 04/18/2020 15:58  PTH, Intact Latest Ref Range: 15 - 65 pg/mL 166 (H)  PTH-related peptide Latest Units: pmol/L <2.0  PTH Interp Unknown Comment  Creatinine, Urine Latest Ref Range: Not Estab. mg/dL 54.2  Calcium, Urine Latest Ref Range: Not Estab. mg/dL 5.7  Calcium, 24H Urine Latest Ref Range: 0 - 320 mg/24 hr 91  Creatinine, 24H Ur Latest Ref Range: 800 - 1,800 mg/24 hr 867    Results for YOANNA, JURCZYK (MRN [id number]) as of 05/02/2020 14:48  Ref. Range 04/18/2020 15:58  Calcium Latest Ref Range: 8.7 - 10.3 mg/dL 11.0 (H)  Phosphorus Latest Ref Range: 3.0 - 4.3 mg/dL 3.7  Magnesium Latest Ref Range: 1.6 - 2.3 mg/dL 2.0    Results for NAQUITA, NAPPIER (MRN [id number]) as of 08/13/2020 13:04  Ref. Range 08/07/2020 10:09  Calcium Latest Ref Range: 8.7 - 10.3 mg/dL 10.4 (H)  PTH, Intact Latest Ref Range: 15 - 65 pg/mL 81 (H)     -------------------------------------------------------------------------------------------------------------------------------- Assessment/ PLAN:  1. Hypercalcemia / Hyperparathyroidism  Patient has had several instances of elevated calcium, with the highest level being at 11.2 mg/dL. A corresponding intact PTH level was also high, at 116.  - Patient also has vitamin D deficiency, with the last level being 20.9 and she is on Vitamin D supplementation.  - No apparent complications from hypercalcemia/hyperparathyroidism: no history of nephrolithiasis, osteoporosis, fragility fractures, no abdominal pain, no major mood disorders, no bone pain.  -She was started on Sensipar 30 mg po every other day at last visit.  Recheck of her PTH and Calcium levels show improvement since starting.  She is advised to continue for now.  Will recheck PTH and Calcium levels and 24-hr urine specimen prior to next visit in 4 months.     I spent 20 minutes dedicated to the care of this patient on the date of this encounter to include pre-visit review of records, face-to-face time with the patient, and post visit ordering of  testing.   [name]  participated in the discussions, expressed understanding, and voiced agreement with  the above plans.  All questions were answered to her satisfaction. she is encouraged to contact clinic should she have any questions or concerns prior to her return visit.   Follow up Plan: - Return in about 4 months (around 12/13/2020) for Hypercalcemia follow up, Previsit labs with 24-hr urine.   Rayetta Pigg, [facility] [address] [place], [place] [address] Phone: [phone] Fax: [phone]  08/13/2020, 1:34 PM

## 2020-08-13 NOTE — Patient Instructions (Signed)
Hypercalcemia Hypercalcemia is when the level of calcium in a person's blood is above normal. The body needs calcium to make bones and keep them strong. Calcium also helpsthe muscles, nerves, brain, and heart work the way they should. Most of the calcium in the body is in the bones. There is also some calcium in the blood. Hypercalcemia can happen when calcium comes out of the bones, or when the kidneys are not able to remove calcium from the blood. Hypercalcemiacan be mild or severe. What are the causes? There are many possible causes of hypercalcemia. Common causes of this condition include: Hyperparathyroidism. This is a condition in which the body produces too much parathyroid hormone. There are four parathyroid glands in your neck. These glands produce a chemical messenger (hormone) that helps the body absorb calcium from foods and helps your bones release calcium. Certain kinds of cancer. Less common causes of hypercalcemia include: Getting too much calcium or vitamin D from your diet. Kidney failure. Hyperthyroidism. Severe dehydration. Being on bed rest or being inactive for a long time. Certain medicines. Infections. What increases the risk? You are more likely to develop this condition if you: Are female. Are 60 years of age or older. Have a family history of hypercalcemia. What are the signs or symptoms? Mild hypercalcemia that starts slowly may not cause symptoms. Severe, sudden hypercalcemia is more likely to cause symptoms, such as: Being more thirsty than usual. Needing to urinate more often than usual. Abdominal pain. Nausea and vomiting. Constipation. Muscle pain, twitching, or weakness. Feeling very tired. How is this diagnosed?  Hypercalcemia is usually diagnosed with a blood test. You may also have tests to help determine what is causing this condition, such as imaging tests andmore blood tests. How is this treated? Treatment for hypercalcemia depends on the  cause. Treatment may include: Receiving fluids through an IV. Medicines that: Keep calcium levels steady after receiving fluids (loop diuretics). Keep calcium in your bones (bisphosphonates). Lower the calcium level in your blood. Surgery to remove overactive parathyroid glands. A procedure that filters your blood to correct calcium levels (hemodialysis). Follow these instructions at home:  Take over-the-counter and prescription medicines only as told by your health care provider. Follow instructions from your health care provider about eating or drinking restrictions. Drink enough fluid to keep your urine pale yellow. Stay active. Weight-bearing exercise helps to keep calcium in your bones. Follow instructions from your health care provider about what type and level of exercise is safe for you. Keep all follow-up visits as told by your health care provider. This is important. Contact a health care provider if you have: A fever. A heartbeat that is irregular or very fast. Changes in mood, memory, or personality. Get help right away if you: Have severe abdominal pain. Have chest pain. Have trouble breathing. Become very confused and sleepy. Lose consciousness. Summary Hypercalcemia is when the level of calcium in a person's blood is above normal. The body needs calcium to make bones and keep them strong. Calcium also helps the muscles, nerves, brain, and heart work the way they should. There are many possible causes of hypercalcemia, and treatment depends on the cause. Take over-the-counter and prescription medicines only as told by your health care provider. Follow instructions from your health care provider about eating or drinking restrictions. This information is not intended to replace advice given to you by your health care provider. Make sure you discuss any questions you have with your healthcare provider. Document Revised: 03/08/2018 Document   Reviewed: 11/15/2017 Elsevier  Patient Education  2022 [employer] Inc.  

## 2020-08-14 ENCOUNTER — Other Ambulatory Visit: Payer: Medicare Other | Admitting: Nurse Practitioner

## 2020-08-14 VITALS — BP 132/84 | HR 98 | Resp 18

## 2020-08-14 DIAGNOSIS — Z515 Encounter for palliative care: Secondary | ICD-10-CM | POA: Diagnosis not present

## 2020-08-14 NOTE — Progress Notes (Signed)
Therapist, nutritional Palliative Care Consult Note Telephone: [phone]  Fax: [phone]    Date of encounter: 08/14/20 PATIENT NAME: Sheila [name] [address] [place] [address]   [phone] (home)  DOB: 1940-11-11 MRN: [id number]  PRIMARY CARE PROVIDER:    Junie Spencer, FNP,  [address] [place] [address] [phone]  REFERRING PROVIDER:   Junie Spencer, FNP [address] [place],  [place] [address] [phone]  RESPONSIBLE PARTY:    Contact Information     Name Relation Home Work [place] Niece [phone]  [phone]   Mabe,Kathy Daughter [phone]  [phone]   [facility] Daughter   [phone]   Roma Kayser Granddaughter   [phone]     I met face to face with patient in home, her caregiver present during visit. Palliative Care was asked to follow this patient by consultation request of  Junie Spencer, FNP to address advance care planning and complex medical decision making. This is a follow up visit.                                   ASSESSMENT AND PLAN / RECOMMENDATIONS:   Advance Care Planning/Goals of Care:  CODE STATUS: DNR Goal of Care: Goal of care is comfort while preserving function. Directives: Signed DNR and MOST forms present in home, copy on [facility] EMR. Details of the MOST form include, limited additional interventions, determine use or limitation of antibiotics when infection occurs, IV fluids if indicated, feeding tube long term if indicated.  Symptom Management/Plan: Hypercalcemia: no report of concerning symptoms, continue current plan of care. Continue follow up with Endocrinology as scheduled.  Follow up Palliative Care Visit: Palliative care will continue to follow for complex medical decision making, advance care planning, and clarification of goals. Return as needed.  I spent 48 minutes providing this consultation. More than 50% of the  time in this consultation was spent in counseling and care coordination.  PPS: 50%  HOSPICE ELIGIBILITY/DIAGNOSIS: TBD  CHIEF COMPLAIN: follow up palliative care visit  History obtained from review of Epic EMR and discussion Ms. Margo Aye and her caregiver.  HISTORY OF PRESENT ILLNESS:  Sheila [name] is a 80 y.o. year old female with heart failure (EF 20-20%), COPD, anxiety (on xanax), Chronic pain synfrome, DJD, CKD 3.  Patient seen today for follow up palliative care visit. She report feeling well and at her baseline function. Her caregiver voiced no new concerns for patient today, no report of falls. Patient does not appear acutely ill, no sign of acute distress observed. Patient denied fever, denied chills, denied increased SOB, denied chest pain or any uncontrolled pain. She report fair appetite. Ten systems reviewed and are negative for acute change, except as noted in the HPI.  Patient had a telehealth visit with her Endocrinologist yesterday for follow up on her Hypercalcemia. Review of the visit note showed improvement in PTH and calcium levels, no change in therapy recommended, patient is to continue current regimen of Sensipar 30mg  by mouth every other day.   I reviewed available labs, medications, imaging, studies and related documents from the EMR.  Records reviewed and summarized above.   Physical Exam: General: frail appearing, morbidly obese, sitting in recliner in NAD CV:  no LE edema Pulmonary: no increased work of breathing, no cough, no wheezing, room air MSK:  ambulatory Skin:  warm and dry, no rashes or wounds on visible skin Neuro: Generalized weakness, A and O x 3 Psych: non-anxious affect today Hem/lymph/immuno: no widespread bruising  Past Medical History:  Diagnosis Date   Allergy    Bell's palsy    Cancer ([place])    skin CA on face   Cataract    Chest pain 07/2009   Myoveiw stress test negative.   Depression    Diverticulosis of colon    DJD (degenerative joint  disease) of knee    Dyspnea    on exertion   Family history of adverse reaction to anesthesia    sister ha nausea/vomiting   Gastric ulcer with hemorrhage 01/14/2011   GERD (gastroesophageal reflux disease)    Headache    History of fractured pelvis    Hypercholesteremia    Hypertension    Hypothyroidism    LV dysfunction    UTI (lower urinary tract infection)     Current Outpatient Medications on File Prior to Visit  Medication Sig Dispense Refill   cephALEXin (KEFLEX) 500 MG capsule Take 1 capsule (500 mg total) by mouth 2 (two) times daily. 14 capsule 0   albuterol (PROVENTIL) (2.5 MG/3ML) 0.083% nebulizer solution NEBULIZE 1 VIAL EVERY 6-8 HOURS AS NEEDED FOR WHEEZING OR SHORTNESS OF BREATH 180 mL 1   ALPRAZolam (XANAX) 0.5 MG tablet TAKE (1) TABLET TWICE A DAY. 60 tablet 2   Cholecalciferol (VITAMIN D3) 25 MCG (1000 UT) CAPS Take 1,000 Units by mouth daily.      cinacalcet (SENSIPAR) 30 MG tablet Take 1 tablet (30 mg total) by mouth every other day. 60 tablet 2   clotrimazole-betamethasone (LOTRISONE) cream APPLY TO AFFECTED AREAS TWICE A DAY 30 g 0   diclofenac sodium (VOLTAREN) 1 % GEL Apply 2 g topically 4 (four) times daily. (Patient taking differently: Apply 2 g topically 4 (four) times daily as needed (pain).) 100 g 0   DULoxetine (CYMBALTA) 60 MG capsule TAKE 1 CAPUSLE ONCE DAILY 90 capsule 0   ENTRESTO 49-51 MG TAKE 1 TABLET 2 TIMES A DAY 60 tablet 11   fluticasone (FLONASE) 50 MCG/ACT nasal spray PLACE 2 SPRAYS INTO BOTH NOSTRILS DAILY 16 g 2   fluticasone furoate-vilanterol (BREO ELLIPTA) 100-25 MCG/INH AEPB Inhale 1 puff into the lungs daily. 1 each 11   gabapentin (NEURONTIN) 300 MG capsule TAKE (1) CAPSULE THREE TIMES DAILY. 270 capsule 0   gabapentin (NEURONTIN) 300 MG capsule TAKE (1) CAPSULE THREE TIMES DAILY. 314 capsule 0   Garlic 10 MG CAPS Take by mouth.     levocetirizine (XYZAL) 5 MG tablet TAKE 1 TABLET ONCE DAILY IN THE EVENING 30 tablet 5   levothyroxine  (SYNTHROID) 88 MCG tablet Take 1 tablet (88 mcg total) by mouth daily. 90 tablet 1   meclizine (ANTIVERT) 25 MG tablet Take 1 tablet (25 mg total) by mouth 3 (three) times daily as needed for dizziness. 90 tablet 3   Multiple Vitamin (MULTIVITAMIN WITH MINERALS) TABS tablet Take 1 tablet by mouth daily.     Omega-3 Fatty Acids (FISH OIL OMEGA-3 PO) Take 1 capsule by mouth every evening.      pantoprazole (PROTONIX) 20 MG tablet Take 1 tablet (20 mg total) by mouth at bedtime. 90 tablet 2   Pitavastatin Calcium (LIVALO) 4 MG TABS Take 1 tablet (4 mg total) by mouth daily. 90 tablet 1   potassium chloride SA (KLOR-CON) 20 MEQ tablet TAKE (1) TABLET TWICE A DAY. 180 tablet 0   predniSONE (  STERAPRED UNI-PAK 21 TAB) 10 MG (21) TBPK tablet Use as directed 21 tablet 0   spironolactone (ALDACTONE) 25 MG tablet TAKE 1 TABLET ONCE DAILY 30 tablet 11   Tetrahydrozoline HCl (VISINE OP) Place 1 drop into both eyes daily as needed (dry eyes/itching).     torsemide (DEMADEX) 20 MG tablet TAKE 2 TABLETS DAILY AS DIRECTED 60 tablet 5   traMADol (ULTRAM) 50 MG tablet Take 2 tablets (100 mg total) by mouth every 12 (twelve) hours as needed. 120 tablet 2   triamcinolone ointment (KENALOG) 0.5 % APPLY TO AFFECTED AREAS TWICE A DAY 30 g 1   No current facility-administered medications on file prior to visit.    Thank you for the opportunity to participate in the care of Ms. Stelzner.  The palliative care team will continue to follow. Please call our office at [phone] if we can be of additional assistance.   Jari Favre, DNP, AGPCNP-BC  COVID-19 PATIENT SCREENING TOOL Asked and negative response unless otherwise noted:   Have you had symptoms of covid, tested positive or been in contact with someone with symptoms/positive test in the past 5-10 days?

## 2020-08-22 DIAGNOSIS — X32XXXA Exposure to sunlight, initial encounter: Secondary | ICD-10-CM | POA: Diagnosis not present

## 2020-08-22 DIAGNOSIS — L820 Inflamed seborrheic keratosis: Secondary | ICD-10-CM | POA: Diagnosis not present

## 2020-08-22 DIAGNOSIS — B078 Other viral warts: Secondary | ICD-10-CM | POA: Diagnosis not present

## 2020-08-22 DIAGNOSIS — L570 Actinic keratosis: Secondary | ICD-10-CM | POA: Diagnosis not present

## 2020-08-23 DIAGNOSIS — M1990 Unspecified osteoarthritis, unspecified site: Secondary | ICD-10-CM | POA: Diagnosis not present

## 2020-08-27 ENCOUNTER — Other Ambulatory Visit: Payer: Self-pay | Admitting: Family

## 2020-08-30 ENCOUNTER — Other Ambulatory Visit: Payer: Self-pay | Admitting: Family

## 2020-08-30 DIAGNOSIS — K219 Gastro-esophageal reflux disease without esophagitis: Secondary | ICD-10-CM

## 2020-09-02 ENCOUNTER — Other Ambulatory Visit: Payer: Self-pay | Admitting: Family

## 2020-09-02 DIAGNOSIS — J449 Chronic obstructive pulmonary disease, unspecified: Secondary | ICD-10-CM

## 2020-09-03 ENCOUNTER — Telehealth: Payer: Self-pay | Admitting: Family

## 2020-09-03 MED ORDER — BUDESONIDE-FORMOTEROL FUMARATE 160-4.5 MCG/ACT IN AERO: 2.0000 | INHALATION_SPRAY | Freq: Two times a day (BID) | RESPIRATORY_TRACT | 3 refills | Status: DC

## 2020-09-03 NOTE — Telephone Encounter (Signed)
Pt does not like to take fluticasone furoate-vilanterol (BREO ELLIPTA) 100-25 MCG/INH AEPB. She wants to take symbicort. Use [employer]

## 2020-09-03 NOTE — Telephone Encounter (Signed)
Symbicort Prescription sent to pharmacy   

## 2020-09-03 NOTE — Telephone Encounter (Signed)
Patient aware.

## 2020-09-03 NOTE — Telephone Encounter (Signed)
Please advise 

## 2020-09-06 ENCOUNTER — Ambulatory Visit (INDEPENDENT_AMBULATORY_CARE_PROVIDER_SITE_OTHER): Payer: Medicare Other | Admitting: Nurse Practitioner

## 2020-09-06 ENCOUNTER — Encounter: Payer: Self-pay | Admitting: Nurse Practitioner

## 2020-09-06 ENCOUNTER — Other Ambulatory Visit: Payer: Self-pay

## 2020-09-06 VITALS — BP 138/69 | HR 110 | Temp 97.2°F | Ht 59.0 in | Wt 233.0 lb

## 2020-09-06 DIAGNOSIS — R21 Rash and other nonspecific skin eruption: Secondary | ICD-10-CM | POA: Insufficient documentation

## 2020-09-06 MED ORDER — NYSTATIN 100000 UNIT/GM EX POWD: 1.0000 "application " | Freq: Three times a day (TID) | CUTANEOUS | 0 refills | Status: DC

## 2020-09-06 MED ORDER — HYDROCORTISONE 1 % EX LOTN: 1.0000 "application " | TOPICAL_LOTION | Freq: Two times a day (BID) | CUTANEOUS | 0 refills | Status: DC

## 2020-09-06 MED ORDER — PREDNISONE 10 MG (21) PO TBPK: ORAL_TABLET | ORAL | 0 refills | Status: DC

## 2020-09-06 NOTE — Patient Instructions (Signed)
Rash, Adult  A rash is a change in the color of your skin. A rash can also change the way your skin feels. There are many different conditions and factors that can causea rash. Follow these instructions at home: The goal of treatment is to stop the itching and keep the rash from spreading. Watch for any changes in your symptoms. Let your doctor know about them. Followthese instructions to help with your condition: Medicine Take or apply over-the-counter and prescription medicines only as told by your doctor. These may include medicines: To treat red or swollen skin (corticosteroid creams). To treat itching. To treat an allergy (oral antihistamines). To treat very bad symptoms (oral corticosteroids).  Skin care Put cool cloths (compresses) on the affected areas. Do not scratch or rub your skin. Avoid covering the rash. Make sure that the rash is exposed to air as much as possible. Managing itching and discomfort Avoid hot showers or baths. These can make itching worse. A cold shower may help. Try taking a bath with: Epsom salts. You can get these at your local pharmacy or grocery store. Follow the instructions on the package. Baking soda. Pour a small amount into the bath as told by your doctor. Colloidal oatmeal. You can get this at your local pharmacy or grocery store. Follow the instructions on the package. Try putting baking soda paste onto your skin. Stir water into baking soda until it gets like a paste. Try putting on a lotion that relieves itchiness (calamine lotion). Keep cool and out of the sun. Sweating and being hot can make itching worse. General instructions  Rest as needed. Drink enough fluid to keep your pee (urine) pale yellow. Wear loose-fitting clothing. Avoid scented soaps, detergents, and perfumes. Use gentle soaps, detergents, perfumes, and other cosmetic products. Avoid anything that causes your rash. Keep a journal to help track what causes your rash. Write  down: What you eat. What cosmetic products you use. What you drink. What you wear. This includes jewelry. Keep all follow-up visits as told by your doctor. This is important.  Contact a doctor if: You sweat at night. You lose weight. You pee (urinate) more than normal. You pee less than normal, or you notice that your pee is a darker color than normal. You feel weak. You throw up (vomit). Your skin or the whites of your eyes look yellow (jaundice). Your skin: Tingles. Is numb. Your rash: Does not go away after a few days. Gets worse. You are: More thirsty than normal. More tired than normal. You have: New symptoms. Pain in your belly (abdomen). A fever. Watery poop (diarrhea). Get help right away if: You have a fever and your symptoms suddenly get worse. You start to feel mixed up (confused). You have a very bad headache or a stiff neck. You have very bad joint pains or stiffness. You have jerky movements that you cannot control (seizure). Your rash covers all or most of your body. The rash may or may not be painful. You have blisters that: Are on top of the rash. Grow larger. Grow together. Are painful. Are inside your nose or mouth. You have a rash that: Looks like purple pinprick-sized spots all over your body. Has a "bull's eye" or looks like a target. Is red and painful, causes your skin to peel, and is not from being in the sun too long. Summary A rash is a change in the color of your skin. A rash can also change the way your skin feels.   The goal of treatment is to stop the itching and keep the rash from spreading. Take or apply over-the-counter and prescription medicines only as told by your doctor. Contact a doctor if you have new symptoms or symptoms that get worse. Keep all follow-up visits as told by your doctor. This is important. This information is not intended to replace advice given to you by your health care provider. Make sure you discuss any  questions you have with your healthcare provider. Document Revised: 06/03/2018 Document Reviewed: 09/13/2017 Elsevier Patient Education  2022 [employer]  

## 2020-09-06 NOTE — Progress Notes (Signed)
Acute Office Visit  Subjective:    Patient ID: Sheila [name], female    DOB: 1941-01-21, 80 y.o.   MRN: [id number]  Chief Complaint  Patient presents with   Pruritis    Rash This is a recurrent problem. The current episode started in the past 7 days. The problem is unchanged. The affected locations include the abdomen and right axilla. The rash is characterized by dryness and itchiness. It is unknown if there was an exposure to a precipitant. Pertinent negatives include no congestion, cough, fever, shortness of breath or sore throat. Past treatments include antihistamine and anti-itch cream. The treatment provided no relief. Her past medical history is significant for allergies.    Past Medical History:  Diagnosis Date   Allergy    Bell's palsy    Cancer ([place])    skin CA on face   Cataract    Chest pain 07/2009   Myoveiw stress test negative.   Depression    Diverticulosis of colon    DJD (degenerative joint disease) of knee    Dyspnea    on exertion   Family history of adverse reaction to anesthesia    sister ha nausea/vomiting   Gastric ulcer with hemorrhage 01/14/2011   GERD (gastroesophageal reflux disease)    Headache    History of fractured pelvis    Hypercholesteremia    Hypertension    Hypothyroidism    LV dysfunction    UTI (lower urinary tract infection)     Past Surgical History:  Procedure Laterality Date   APPENDECTOMY     BREAST BIOPSY Left    ESOPHAGOGASTRODUODENOSCOPY  01/14/2011   Procedure: ESOPHAGOGASTRODUODENOSCOPY (EGD);  Surgeon: Rogene Houston, MD;  Location: [facility];  Service: Endoscopy;  Laterality: N/A;   HIP SURGERY     pelvis   JOINT REPLACEMENT     KNEE SURGERY  04/2010.   Total left knee replacement   LAPAROSCOPIC APPENDECTOMY N/A 08/01/2013   Procedure: APPENDECTOMY LAPAROSCOPIC;  Surgeon: Jamesetta So, MD;  Location: [facility];  Service: General;  Laterality: N/A;   RIGHT/LEFT HEART CATH AND CORONARY ANGIOGRAPHY N/A 10/02/2019    Procedure: RIGHT/LEFT HEART CATH AND CORONARY ANGIOGRAPHY;  Surgeon: Lorretta Harp, MD;  Location: [facility];  Service: Cardiovascular;  Laterality: N/A;    Family History  Problem Relation Age of Onset   Aneurysm Mother    Stroke Mother    Hypertension Mother    Cancer Father        lung   Heart disease Sister    Hip fracture Sister    Cancer Brother    Alzheimer's disease Sister    Cancer Sister        skin, uterine   Cancer Brother     Social History   Socioeconomic History   Marital status: Widowed    Spouse name: Not on file   Number of children: 2   Years of education: 0   Highest education level: Never attended school  Occupational History   Occupation: retired  Tobacco Use   Smoking status: Former    Types: Cigarettes    Quit date: 02/24/1968    Years since quitting: 52.5   Smokeless tobacco: Current    Types: Snuff   Tobacco comments:    quit yrs and yrs ago when she was very young  Media planner   Vaping Use: Never used  Substance and Sexual Activity   Alcohol use: No   Drug use: No  Sexual activity: Not Currently    Birth control/protection: Post-menopausal  Other Topics Concern   Not on file  Social History Narrative   Not on file   Social Determinants of Health   Financial Resource Strain: Not on file  Food Insecurity: Not on file  Transportation Needs: Not on file  Physical Activity: Not on file  Stress: Not on file  Social Connections: Not on file  Intimate Partner Violence: Not on file    Outpatient Medications Prior to Visit  Medication Sig Dispense Refill   albuterol (PROVENTIL) (2.5 MG/3ML) 0.083% nebulizer solution NEBULIZE 1 VIAL EVERY 6-8 HOURS AS NEEDED FOR WHEEZING OR SHORTNESS OF BREATH 180 mL 1   ALPRAZolam (XANAX) 0.5 MG tablet TAKE (1) TABLET TWICE A DAY. 60 tablet 2   budesonide-formoterol (SYMBICORT) 160-4.5 MCG/ACT inhaler Inhale 2 puffs into the lungs 2 (two) times daily. 1 each 3   Cholecalciferol (VITAMIN D3)  25 MCG (1000 UT) CAPS Take 1,000 Units by mouth daily.      cinacalcet (SENSIPAR) 30 MG tablet Take 1 tablet (30 mg total) by mouth every other day. 60 tablet 2   diclofenac sodium (VOLTAREN) 1 % GEL Apply 2 g topically 4 (four) times daily. (Patient taking differently: Apply 2 g topically 4 (four) times daily as needed (pain).) 100 g 0   DULoxetine (CYMBALTA) 60 MG capsule TAKE 1 CAPUSLE ONCE DAILY 90 capsule 0   ENTRESTO 49-51 MG TAKE 1 TABLET 2 TIMES A DAY 60 tablet 11   fluticasone (FLONASE) 50 MCG/ACT nasal spray PLACE 2 SPRAYS INTO BOTH NOSTRILS DAILY 16 g 2   gabapentin (NEURONTIN) 300 MG capsule TAKE (1) CAPSULE THREE TIMES DAILY. 270 capsule 0   gabapentin (NEURONTIN) 300 MG capsule TAKE (1) CAPSULE THREE TIMES DAILY. 093 capsule 0   Garlic 10 MG CAPS Take by mouth.     levocetirizine (XYZAL) 5 MG tablet TAKE 1 TABLET ONCE DAILY IN THE EVENING 30 tablet 5   levothyroxine (SYNTHROID) 88 MCG tablet Take 1 tablet (88 mcg total) by mouth daily. 90 tablet 1   meclizine (ANTIVERT) 25 MG tablet Take 1 tablet (25 mg total) by mouth 3 (three) times daily as needed for dizziness. 90 tablet 3   Multiple Vitamin (MULTIVITAMIN WITH MINERALS) TABS tablet Take 1 tablet by mouth daily.     Omega-3 Fatty Acids (FISH OIL OMEGA-3 PO) Take 1 capsule by mouth every evening.      pantoprazole (PROTONIX) 20 MG tablet TAKE ONE TABLET AT BEDTIME 90 tablet 1   Pitavastatin Calcium (LIVALO) 4 MG TABS Take 1 tablet (4 mg total) by mouth daily. 90 tablet 1   potassium chloride SA (KLOR-CON) 20 MEQ tablet TAKE (1) TABLET TWICE A DAY. 180 tablet 0   spironolactone (ALDACTONE) 25 MG tablet TAKE 1 TABLET ONCE DAILY 30 tablet 11   Tetrahydrozoline HCl (VISINE OP) Place 1 drop into both eyes daily as needed (dry eyes/itching).     torsemide (DEMADEX) 20 MG tablet TAKE 2 TABLETS DAILY AS DIRECTED 60 tablet 5   traMADol (ULTRAM) 50 MG tablet Take 2 tablets (100 mg total) by mouth every 12 (twelve) hours as needed. 120  tablet 2   clotrimazole-betamethasone (LOTRISONE) cream APPLY TO AFFECTED AREAS TWICE A DAY 30 g 0   predniSONE (STERAPRED UNI-PAK 21 TAB) 10 MG (21) TBPK tablet Use as directed 21 tablet 0   triamcinolone ointment (KENALOG) 0.5 % APPLY TO AFFECTED AREAS TWICE A DAY 30 g 1   No facility-administered medications  prior to visit.    Allergies  Allergen Reactions   Ibuprofen Other (See Comments)    History of bleeding ulcers - contra-indicated    Benadryl [Diphenhydramine Hcl] Other (See Comments)    Worsening depression    Lipitor [Atorvastatin] Other (See Comments)    Body aches.   Baclofen Other (See Comments)    Loopy    Hydrocodone Nausea And Vomiting   Codeine Nausea And Vomiting   Morphine And Related Nausea And Vomiting   Tdap [Tetanus-Diphth-Acell Pertussis] Swelling    Redness and localized swelling at injection site     Review of Systems  Constitutional:  Negative for fever.  HENT:  Negative for congestion and sore throat.   Eyes: Negative.   Respiratory:  Negative for cough and shortness of breath.   Genitourinary: Negative.   Skin:  Positive for rash.  All other systems reviewed and are negative.     Objective:    Physical Exam Vitals and nursing note reviewed.  Constitutional:      Appearance: Normal appearance.  HENT:     Head: Normocephalic.     Mouth/Throat:     Mouth: Mucous membranes are moist.     Pharynx: Oropharynx is clear.  Eyes:     Conjunctiva/sclera: Conjunctivae normal.  Cardiovascular:     Rate and Rhythm: Normal rate and regular rhythm.  Pulmonary:     Effort: Pulmonary effort is normal.  Abdominal:     General: Bowel sounds are normal.  Skin:    General: Skin is dry.     Findings: Erythema and rash present.  Neurological:     Mental Status: Mental status is at baseline.  Psychiatric:        Behavior: Behavior normal.    BP 138/69   Pulse (!) 110   Temp (!) 97.2 F (36.2 C) (Temporal)   Ht 4\' 11"  (1.499 m)   Wt 233 lb  (105.7 kg)   SpO2 93%   BMI 47.06 kg/m  Wt Readings from Last 3 Encounters:  09/06/20 233 lb (105.7 kg)  07/02/20 216 lb 12.8 oz (98.3 kg)  06/28/20 220 lb (99.8 kg)    Health Maintenance Due  Topic Date Due   COVID-19 Vaccine (1) Never done   Zoster Vaccines- Shingrix (1 of 2) Never done    There are no preventive care reminders to display for this patient.   Lab Results  Component Value Date   TSH 1.180 06/17/2020   Lab Results  Component Value Date   WBC 19.3 (H) 07/02/2020   HGB 14.8 07/02/2020   HCT 42.6 07/02/2020   MCV 88 07/02/2020   PLT 619 (H) 07/02/2020   Lab Results  Component Value Date   NA 139 07/02/2020   K 5.2 07/02/2020   CO2 17 (L) 07/02/2020   GLUCOSE 117 (H) 07/02/2020   BUN 29 (H) 07/02/2020   CREATININE 1.09 (H) 07/02/2020   BILITOT 1.0 06/17/2020   ALKPHOS 67 06/17/2020   AST 16 06/17/2020   ALT 9 06/17/2020   PROT 7.9 06/17/2020   ALBUMIN 4.3 06/17/2020   CALCIUM 10.4 (H) 08/07/2020   ANIONGAP 12 10/24/2019   EGFR 52 (L) 07/02/2020   Lab Results  Component Value Date   CHOL 163 07/26/2019   Lab Results  Component Value Date   HDL 43 07/26/2019   Lab Results  Component Value Date   LDLCALC 86 07/26/2019   Lab Results  Component Value Date   TRIG 200 (H) 07/26/2019  Lab Results  Component Value Date   CHOLHDL 3.8 07/26/2019   Lab Results  Component Value Date   HGBA1C 5.6 12/15/2016       Assessment & Plan:   Problem List Items Addressed This Visit       Musculoskeletal and Integument   Rash - Primary    Unresolved rash underneath left arm and underneath abdominal folds.  Started patient on nystatin powder, prednisone taper, and hydrocortisone cream.  Advised patient to keep skin moisturized, avoid hot baths and showers, follow-up with worseningsymptoms.  Rx sent to pharmacy.       Relevant Medications   hydrocortisone 1 % lotion   nystatin (MYCOSTATIN/NYSTOP) powder   predniSONE (STERAPRED UNI-PAK 21  TAB) 10 MG (21) TBPK tablet     Meds ordered this encounter  Medications   hydrocortisone 1 % lotion    Sig: Apply 1 application topically 2 (two) times daily.    Dispense:  118 mL    Refill:  0    Order Specific Question:   Supervising Provider    Answer:   Janora Norlander [[id number]]   nystatin (MYCOSTATIN/NYSTOP) powder    Sig: Apply 1 application topically 3 (three) times daily.    Dispense:  15 g    Refill:  0    Order Specific Question:   Supervising Provider    Answer:   Janora Norlander [[id number]]   predniSONE (STERAPRED UNI-PAK 21 TAB) 10 MG (21) TBPK tablet    Sig: 6 tablet day 1, 5 tablet day 2, 4 tablet in 3, 3 tablet day for 2 tablet day 5, 1 tablet every 6,    Dispense:  1 each    Refill:  0    Order Specific Question:   Supervising Provider    Answer:   Janora Norlander [[id number]]     Ivy Lynn, NP

## 2020-09-06 NOTE — Assessment & Plan Note (Signed)
Unresolved rash underneath left arm and underneath abdominal folds.  Started patient on nystatin powder, prednisone taper, and hydrocortisone cream.  Advised patient to keep skin moisturized, avoid hot baths and showers, follow-up with worseningsymptoms.  Rx sent to pharmacy.

## 2020-09-12 ENCOUNTER — Other Ambulatory Visit: Payer: Self-pay | Admitting: Family

## 2020-09-12 DIAGNOSIS — G8929 Other chronic pain: Secondary | ICD-10-CM

## 2020-09-16 ENCOUNTER — Ambulatory Visit: Payer: Medicare Other | Admitting: Family

## 2020-09-17 ENCOUNTER — Other Ambulatory Visit: Payer: Self-pay | Admitting: Family

## 2020-09-17 DIAGNOSIS — G8929 Other chronic pain: Secondary | ICD-10-CM

## 2020-09-18 ENCOUNTER — Other Ambulatory Visit: Payer: Self-pay | Admitting: Family

## 2020-09-18 DIAGNOSIS — G8929 Other chronic pain: Secondary | ICD-10-CM

## 2020-09-18 DIAGNOSIS — F411 Generalized anxiety disorder: Secondary | ICD-10-CM

## 2020-09-18 DIAGNOSIS — M549 Dorsalgia, unspecified: Secondary | ICD-10-CM

## 2020-09-18 DIAGNOSIS — F331 Major depressive disorder, recurrent, moderate: Secondary | ICD-10-CM

## 2020-09-19 ENCOUNTER — Other Ambulatory Visit: Payer: Self-pay | Admitting: Family

## 2020-09-19 ENCOUNTER — Encounter: Payer: Self-pay | Admitting: Family

## 2020-09-19 DIAGNOSIS — G8929 Other chronic pain: Secondary | ICD-10-CM

## 2020-09-20 ENCOUNTER — Other Ambulatory Visit: Payer: Self-pay | Admitting: Family

## 2020-09-20 DIAGNOSIS — G8929 Other chronic pain: Secondary | ICD-10-CM

## 2020-09-21 ENCOUNTER — Other Ambulatory Visit: Payer: Self-pay | Admitting: Family

## 2020-09-21 DIAGNOSIS — G8929 Other chronic pain: Secondary | ICD-10-CM

## 2020-09-21 DIAGNOSIS — M549 Dorsalgia, unspecified: Secondary | ICD-10-CM

## 2020-09-23 ENCOUNTER — Encounter: Payer: Self-pay | Admitting: Family

## 2020-09-23 ENCOUNTER — Other Ambulatory Visit: Payer: Self-pay

## 2020-09-23 ENCOUNTER — Ambulatory Visit (INDEPENDENT_AMBULATORY_CARE_PROVIDER_SITE_OTHER): Payer: Medicare Other | Admitting: Family

## 2020-09-23 VITALS — BP 139/93 | HR 105 | Temp 97.2°F | Ht 59.0 in | Wt 234.6 lb

## 2020-09-23 DIAGNOSIS — G8929 Other chronic pain: Secondary | ICD-10-CM

## 2020-09-23 DIAGNOSIS — M159 Polyosteoarthritis, unspecified: Secondary | ICD-10-CM

## 2020-09-23 DIAGNOSIS — F1320 Sedative, hypnotic or anxiolytic dependence, uncomplicated: Secondary | ICD-10-CM

## 2020-09-23 DIAGNOSIS — Z23 Encounter for immunization: Secondary | ICD-10-CM | POA: Diagnosis not present

## 2020-09-23 DIAGNOSIS — E039 Hypothyroidism, unspecified: Secondary | ICD-10-CM | POA: Diagnosis not present

## 2020-09-23 DIAGNOSIS — I10 Essential (primary) hypertension: Secondary | ICD-10-CM

## 2020-09-23 DIAGNOSIS — F411 Generalized anxiety disorder: Secondary | ICD-10-CM

## 2020-09-23 DIAGNOSIS — M1990 Unspecified osteoarthritis, unspecified site: Secondary | ICD-10-CM | POA: Diagnosis not present

## 2020-09-23 DIAGNOSIS — J441 Chronic obstructive pulmonary disease with (acute) exacerbation: Secondary | ICD-10-CM | POA: Diagnosis not present

## 2020-09-23 DIAGNOSIS — M8949 Other hypertrophic osteoarthropathy, multiple sites: Secondary | ICD-10-CM

## 2020-09-23 DIAGNOSIS — E210 Primary hyperparathyroidism: Secondary | ICD-10-CM

## 2020-09-23 DIAGNOSIS — Z79899 Other long term (current) drug therapy: Secondary | ICD-10-CM

## 2020-09-23 DIAGNOSIS — E782 Mixed hyperlipidemia: Secondary | ICD-10-CM | POA: Diagnosis not present

## 2020-09-23 DIAGNOSIS — M549 Dorsalgia, unspecified: Secondary | ICD-10-CM

## 2020-09-23 MED ORDER — ALPRAZOLAM 0.5 MG PO TABS: 0.5000 mg | ORAL_TABLET | Freq: Every evening | ORAL | 2 refills | Status: DC | PRN

## 2020-09-23 MED ORDER — TRAMADOL HCL 50 MG PO TABS: 100.0000 mg | ORAL_TABLET | Freq: Two times a day (BID) | ORAL | 2 refills | Status: DC | PRN

## 2020-09-23 NOTE — Progress Notes (Signed)
Subjective:    Patient ID: Sheila [name], female    DOB: 07-16-1940, 80 y.o.   MRN: [id number]  Chief Complaint  Patient presents with   Medical Management of Chronic Issues   Pt presents to the office today for chronic follow up. She is followed by Cardiologists every 3 months for CAD and CHF. She is currently on palliative care and has an aid and family that stays with her 24/7.   She reports she is having a hard time walking long distances. She has to use a rolling walker at all times, however, she can not walk more than a few feet without having to relax.    She saw an Endocrinologists for hypercalcemia & hyperparathyroidism. She is followed by Dermatologists for skin lesion.  Hypertension This is a chronic problem. The current episode started more than 1 year ago. The problem has been waxing and waning since onset. The problem is uncontrolled. Associated symptoms include anxiety, malaise/fatigue and shortness of breath. Pertinent negatives include no peripheral edema. Risk factors for coronary artery disease include dyslipidemia, obesity, diabetes mellitus and sedentary lifestyle. The current treatment provides moderate improvement. Identifiable causes of hypertension include a thyroid problem.  Thyroid Problem Presents for follow-up visit. Symptoms include anxiety, depressed mood and dry skin. Patient reports no constipation or diaphoresis. The symptoms have been stable. Her past medical history is significant for hyperlipidemia.  Arthritis Presents for follow-up visit. She complains of stiffness and joint warmth. The symptoms have been stable. Affected locations include the left knee and right knee. Her pain is at a severity of 0/10.  Hyperlipidemia This is a chronic problem. The current episode started more than 1 year ago. Exacerbating diseases include obesity. Associated symptoms include shortness of breath. Current antihyperlipidemic treatment includes statins. The current treatment  provides moderate improvement of lipids. Risk factors for coronary artery disease include dyslipidemia, hypertension, a sedentary lifestyle and post-menopausal.  Anxiety Presents for follow-up visit. Symptoms include depressed mood, excessive worry, irritability, nervous/anxious behavior and shortness of breath. Patient reports no restlessness. Symptoms occur most days. The severity of symptoms is moderate.    Depression        This is a chronic problem.  The current episode started more than 1 year ago.   The onset quality is gradual.   Associated symptoms include sad.  Associated symptoms include no helplessness, no hopelessness, not irritable and no restlessness.  Past treatments include SNRIs - Serotonin and norepinephrine reuptake inhibitors.  Past medical history includes thyroid problem and anxiety.   COPD Continues to have SOB. Taking Symbicort BID.    Review of Systems  Constitutional:  Positive for irritability and malaise/fatigue. Negative for diaphoresis.  Respiratory:  Positive for shortness of breath.   Gastrointestinal:  Negative for constipation.  Musculoskeletal:  Positive for arthritis and stiffness.  Psychiatric/Behavioral:  Positive for depression. The patient is nervous/anxious.   All other systems reviewed and are negative.     Objective:   Physical Exam Vitals reviewed.  Constitutional:      General: She is not irritable.She is not in acute distress.    Appearance: She is well-developed. She is obese.  HENT:     Head: Normocephalic and atraumatic.     Right Ear: Tympanic membrane normal.     Left Ear: Tympanic membrane normal.  Eyes:     Pupils: Pupils are equal, round, and reactive to light.  Neck:     Thyroid: No thyromegaly.  Cardiovascular:     Rate and  Rhythm: Normal rate and regular rhythm.     Heart sounds: Normal heart sounds. No murmur heard. Pulmonary:     Effort: Pulmonary effort is normal. No respiratory distress.     Breath sounds: Normal  breath sounds. No wheezing.  Abdominal:     General: Bowel sounds are normal. There is no distension.     Palpations: Abdomen is soft.     Tenderness: There is no abdominal tenderness.  Musculoskeletal:        General: No tenderness.     Cervical back: Normal range of motion and neck supple.     Comments: Pain in lumbar with flexion and extension  Skin:    General: Skin is warm and dry.  Neurological:     Mental Status: She is alert and oriented to person, place, and time.     Cranial Nerves: No cranial nerve deficit.     Deep Tendon Reflexes: Reflexes are normal and symmetric.  Psychiatric:        Behavior: Behavior normal.        Thought Content: Thought content normal.        Judgment: Judgment normal.      BP (!) 139/93   Pulse (!) 105   Temp (!) 97.2 F (36.2 C) (Temporal)   Ht 4' 11" (1.499 m)   Wt 234 lb 9.6 oz (106.4 kg)   SpO2 92%   BMI 47.38 kg/m      Assessment & Plan:  Sheila [name] comes in today with chief complaint of Medical Management of Chronic Issues   Diagnosis and orders addressed:  1. Chronic bilateral back pain, unspecified back location - CMP14+EGFR - CBC with Differential/Platelet - traMADol (ULTRAM) 50 MG tablet; Take 2 tablets (100 mg total) by mouth every 12 (twelve) hours as needed.  Dispense: 60 tablet; Refill: 2  2. Essential hypertension, benign - CMP14+EGFR - CBC with Differential/Platelet  3. Chronic obstructive pulmonary disease with acute exacerbation (HCC) - CMP14+EGFR - CBC with Differential/Platelet  4. Hypothyroidism, unspecified type - CMP14+EGFR - CBC with Differential/Platelet - TSH  5. Primary hyperparathyroidism (HCC) - CMP14+EGFR - CBC with Differential/Platelet  6. Primary osteoarthritis involving multiple joints - CMP14+EGFR - CBC with Differential/Platelet  7. Morbid obesity ([place]) - CMP14+EGFR - CBC with Differential/Platelet  8. Benzodiazepine dependence (HCC) - CMP14+EGFR - CBC with  Differential/Platelet - ALPRAZolam (XANAX) 0.5 MG tablet; Take 1 tablet (0.5 mg total) by mouth at bedtime as needed for anxiety. TAKE (1) TABLET TWICE A DAY.  Dispense: 30 tablet; Refill: 2  9. Mixed hyperlipidemia - CMP14+EGFR - CBC with Differential/Platelet  10. GAD (generalized anxiety disorder) - CMP14+EGFR - CBC with Differential/Platelet - ALPRAZolam (XANAX) 0.5 MG tablet; Take 1 tablet (0.5 mg total) by mouth at bedtime as needed for anxiety. TAKE (1) TABLET TWICE A DAY.  Dispense: 30 tablet; Refill: 2  11. Controlled substance agreement signed - CMP14+EGFR - CBC with Differential/Platelet - ALPRAZolam (XANAX) 0.5 MG tablet; Take 1 tablet (0.5 mg total) by mouth at bedtime as needed for anxiety. TAKE (1) TABLET TWICE A DAY.  Dispense: 30 tablet; Refill: 2  12. Need for shingles vaccine - Varicella-zoster vaccine IM (Shingrix) - CMP14+EGFR - CBC with Differential/Platelet  Pt states her pain is a 0 and has not been taking Ultram regularly. Will decrease to #60 from #120. She also reports she is only taking xanax as needed. Will decrease to #30 from #60.  Patient reviewed in [place] controlled database, no flags noted. Contract and drug  screen are up to date.  Labs pending Health Maintenance reviewed Diet and exercise encouraged  Follow up plan: 3 months    [name], FNP

## 2020-09-23 NOTE — Patient Instructions (Signed)

## 2020-09-24 LAB — CBC WITH DIFFERENTIAL/PLATELET
Basophils Absolute: 0.1 10*3/uL (ref 0.0–0.2)
Basos: 1 %
EOS (ABSOLUTE): 0.3 10*3/uL (ref 0.0–0.4)
Eos: 3 %
Hematocrit: 41.5 % (ref 34.0–46.6)
Hemoglobin: 14 g/dL (ref 11.1–15.9)
Immature Grans (Abs): 0 10*3/uL (ref 0.0–0.1)
Immature Granulocytes: 0 %
Lymphocytes Absolute: 3.7 10*3/uL — ABNORMAL HIGH (ref 0.7–3.1)
Lymphs: 30 %
MCH: 29.7 pg (ref 26.6–33.0)
MCHC: 33.7 g/dL (ref 31.5–35.7)
MCV: 88 fL (ref 79–97)
Monocytes Absolute: 0.8 10*3/uL (ref 0.1–0.9)
Monocytes: 7 %
Neutrophils Absolute: 7.3 10*3/uL — ABNORMAL HIGH (ref 1.4–7.0)
Neutrophils: 59 %
Platelets: 288 10*3/uL (ref 150–450)
RBC: 4.71 x10E6/uL (ref 3.77–5.28)
RDW: 13.2 % (ref 11.7–15.4)
WBC: 12.3 10*3/uL — ABNORMAL HIGH (ref 3.4–10.8)

## 2020-09-24 LAB — CMP14+EGFR
ALT: 23 IU/L (ref 0–32)
AST: 25 IU/L (ref 0–40)
Albumin/Globulin Ratio: 1.7 (ref 1.2–2.2)
Albumin: 4.5 g/dL (ref 3.7–4.7)
Alkaline Phosphatase: 68 IU/L (ref 44–121)
BUN/Creatinine Ratio: 18 (ref 12–28)
BUN: 18 mg/dL (ref 8–27)
Bilirubin Total: 0.5 mg/dL (ref 0.0–1.2)
CO2: 22 mmol/L (ref 20–29)
Calcium: 10.5 mg/dL — ABNORMAL HIGH (ref 8.7–10.3)
Chloride: 99 mmol/L (ref 96–106)
Creatinine, Ser: 1.01 mg/dL — ABNORMAL HIGH (ref 0.57–1.00)
Globulin, Total: 2.6 g/dL (ref 1.5–4.5)
Glucose: 134 mg/dL — ABNORMAL HIGH (ref 65–99)
Potassium: 5.2 mmol/L (ref 3.5–5.2)
Sodium: 138 mmol/L (ref 134–144)
Total Protein: 7.1 g/dL (ref 6.0–8.5)
eGFR: 57 mL/min/{1.73_m2} — ABNORMAL LOW (ref >59)

## 2020-09-24 LAB — TSH: TSH: 2.48 u[IU]/mL (ref 0.450–4.500)

## 2020-09-26 ENCOUNTER — Telehealth: Payer: Self-pay | Admitting: Family

## 2020-09-26 NOTE — Telephone Encounter (Signed)
Patient aware and verbalizes understanding.  Wanting to know if something else can be sent in for all over itching

## 2020-09-26 NOTE — Telephone Encounter (Signed)
I am sorry, but prednisone is not a long term medication she can stay on.

## 2020-09-26 NOTE — Telephone Encounter (Signed)
Pts care giver called to see if Sheila [name] could call in a refill on pt's Prednisone Rx because she is starting to itch again and medicine helped her last time.   Please advise and call back.

## 2020-09-27 MED ORDER — HYDROXYZINE PAMOATE 25 MG PO CAPS: 25.0000 mg | ORAL_CAPSULE | Freq: Three times a day (TID) | ORAL | 1 refills | Status: DC | PRN

## 2020-09-27 NOTE — Telephone Encounter (Signed)
Patient aware and verbalized understanding. °

## 2020-09-27 NOTE — Telephone Encounter (Signed)
Vistaril 25 mg TID prn Prescription sent to pharmacy

## 2020-09-27 NOTE — Addendum Note (Signed)
Addended by: [name] A on: 09/27/2020 11:47 AM   Modules accepted: Orders

## 2020-10-03 DIAGNOSIS — B078 Other viral warts: Secondary | ICD-10-CM | POA: Diagnosis not present

## 2020-10-08 ENCOUNTER — Other Ambulatory Visit: Payer: Medicare Other

## 2020-10-08 ENCOUNTER — Other Ambulatory Visit: Payer: Self-pay

## 2020-10-08 DIAGNOSIS — Z515 Encounter for palliative care: Secondary | ICD-10-CM

## 2020-10-10 ENCOUNTER — Ambulatory Visit: Payer: Medicare Other | Admitting: Family

## 2020-10-20 NOTE — Progress Notes (Signed)
COMMUNITY PALLIATIVE CARE SW NOTE  PATIENT NAME: Sheila [name] DOB: Oct 23, 1940 MRN: [id number]  PRIMARY CARE PROVIDER: Sharion Balloon, FNP  RESPONSIBLE PARTY:  Acct ID - Guarantor Home Phone Work Phone Relationship Acct Type  [id number] Sheila [name], Sheila [name] [phone]  Self P/F     [place], [place], [place] [id number]   Due to the COVID-19 crisis, this virtual check-in visit was done via telephone from my office and it was initiated and consent by this patient and or family.   PLAN OF CARE and INTERVENTIONS:             GOALS OF CARE/ ADVANCE CARE PLANNING:  [place] lis for patient to remain at home. Patient is a DNR. SOCIAL/EMOTIONAL/SPIRITUAL ASSESSMENT/ INTERVENTIONS:  SW completed telephonic visit with patient. Patient report that overall she is doing well-some days are better than others. Her appetite remains good. Patient's weight has been stable at 234 lbs. She continue to ambulate with her walker. She has finished physical therapy. She cannot be left alone and has a sitter-Crystal during the day through [place]. Her niece and daughter take turns staying with her at night. Patient denies pain. She is continent of bowel and bladder. She has a place on hier head that she will see the cancer doctor about on 10/21/20. NO other concerns. SW provided supportive presence and assessment of needs and comfort of patient. SW reinforced access to support. Patient remains open to ongoing supportive.  PATIENT/CAREGIVER EDUCATION/ COPING:  Patient appears to be coping well.  PERSONAL EMERGENCY PLAN:  911 can be activated for emergencies. COMMUNITY RESOURCES COORDINATION/ HEALTH CARE NAVIGATION:  Patient has private caregiver during the day. FINANCIAL/LEGAL CONCERNS/INTERVENTIONS:  None.     SOCIAL HX:  Social History   Tobacco Use   Smoking status: Former    Types: Cigarettes    Quit date: 02/24/1968    Years since quitting: 52.6   Smokeless tobacco: Current    Types: Snuff   Tobacco  comments:    quit yrs and yrs ago when she was very young  Substance Use Topics   Alcohol use: No    CODE STATUS: DNR  ADVANCED DIRECTIVES: No MOST FORM COMPLETE:  Yes HOSPICE EDUCATION PROVIDED: No  PPS: Patient is alert and oriented x3. She has a Actuary during the day as she is unable to left alone.  Duration of telephonic visit and documentation: 30 minutes.  [address] [place], [place]

## 2020-10-21 ENCOUNTER — Telehealth: Payer: Self-pay | Admitting: Family

## 2020-10-21 ENCOUNTER — Ambulatory Visit (INDEPENDENT_AMBULATORY_CARE_PROVIDER_SITE_OTHER): Payer: Medicare Other | Admitting: Family

## 2020-10-21 ENCOUNTER — Other Ambulatory Visit: Payer: Self-pay

## 2020-10-21 ENCOUNTER — Encounter: Payer: Self-pay | Admitting: Family

## 2020-10-21 VITALS — BP 151/89 | HR 118 | Temp 97.5°F | Ht 59.0 in

## 2020-10-21 DIAGNOSIS — R0602 Shortness of breath: Secondary | ICD-10-CM | POA: Diagnosis not present

## 2020-10-21 DIAGNOSIS — R251 Tremor, unspecified: Secondary | ICD-10-CM

## 2020-10-21 DIAGNOSIS — R059 Cough, unspecified: Secondary | ICD-10-CM

## 2020-10-21 DIAGNOSIS — J441 Chronic obstructive pulmonary disease with (acute) exacerbation: Secondary | ICD-10-CM

## 2020-10-21 MED ORDER — DOXYCYCLINE HYCLATE 100 MG PO TABS: 100.0000 mg | ORAL_TABLET | Freq: Two times a day (BID) | ORAL | 0 refills | Status: DC

## 2020-10-21 NOTE — Telephone Encounter (Signed)
CNA [employer] questions about pt ov today. Why she is taking abx? Should start potassium rx? CNA aware she is not on dpr--will be at pt home and pt will be there for verbal permission to speak. Please call back

## 2020-10-21 NOTE — Progress Notes (Signed)
Subjective:    Patient ID: Sheila [name], female    DOB: 04/12/1940, 80 y.o.   MRN: [id number]  Chief Complaint  Patient presents with   Tremors    Wants checked for parkinsons    Shortness of Breath   Cough    Started last week    Fatigue   Pt presents to the office today for tremors. She reports she has had these on going for a year, however, they have worsen. She states she is shaking resting and while eating and drinking. It is worse in the morning.   She is also having SOB and cough.  Shortness of Breath This is a new problem. Associated symptoms include headaches and wheezing. Pertinent negatives include no ear pain or fever.  Cough This is a new problem. The current episode started in the past 7 days. The problem has been gradually worsening. The problem occurs every few minutes. The cough is Productive of sputum. Associated symptoms include headaches, nasal congestion, postnasal drip, shortness of breath and wheezing. Pertinent negatives include no chills, ear congestion, ear pain, fever or myalgias. She has tried rest and OTC cough suppressant for the symptoms. The treatment provided mild relief.     Review of Systems  Constitutional:  Negative for chills and fever.  HENT:  Positive for postnasal drip. Negative for ear pain.   Respiratory:  Positive for cough, shortness of breath and wheezing.   Musculoskeletal:  Negative for myalgias.  Neurological:  Positive for headaches.  All other systems reviewed and are negative.     Objective:   Physical Exam Vitals reviewed.  Constitutional:      General: She is not in acute distress.    Appearance: She is well-developed. She is obese. She is ill-appearing.  HENT:     Head: Normocephalic and atraumatic.     Right Ear: External ear normal.  Eyes:     Pupils: Pupils are equal, round, and reactive to light.  Neck:     Thyroid: No thyromegaly.  Cardiovascular:     Rate and Rhythm: Normal rate and regular rhythm.     Heart  sounds: Normal heart sounds. No murmur heard. Pulmonary:     Effort: Pulmonary effort is normal. No respiratory distress.     Breath sounds: Rhonchi present. No wheezing.  Abdominal:     General: Bowel sounds are normal. There is no distension.     Palpations: Abdomen is soft.     Tenderness: There is no abdominal tenderness.  Musculoskeletal:        General: No tenderness. Normal range of motion.     Cervical back: Normal range of motion and neck supple.  Skin:    General: Skin is warm and dry.  Neurological:     Mental Status: She is alert and oriented to person, place, and time.     Cranial Nerves: No cranial nerve deficit.     Motor: Weakness present.     Deep Tendon Reflexes: Reflexes are normal and symmetric.  Psychiatric:        Behavior: Behavior normal.        Thought Content: Thought content normal.        Judgment: Judgment normal.         BP (!) 151/89   Pulse (!) 118   Temp (!) 97.5 F (36.4 C) (Temporal)   Ht '4\' 11"'$  (1.499 m)   SpO2 91%   BMI 47.38 kg/m   Assessment & Plan:  Sheila Anger  [name] comes in today with chief complaint of Tremors (Wants checked for parkinsons ), Shortness of Breath, Cough (Started last week ), and Fatigue   Diagnosis and orders addressed:  1. Cough - Novel Coronavirus, NAA ([facility])  2. Tremor Referral to Neurologists TSH normal at last visit Limit caffeine  - Ambulatory referral to Neurology  3. COPD exacerbation ([place]) - Take meds as prescribed - Use a cool mist humidifier  -Use saline nose sprays frequently -Force fluids -For any cough or congestion  Use plain Mucinex- regular strength or max strength is fine -For fever or aces or pains- take tylenol or ibuprofen. -Throat lozenges if helps - doxycycline (VIBRA-TABS) 100 MG tablet; Take 1 tablet (100 mg total) by mouth 2 (two) times daily.  Dispense: 20 tablet; Refill: 0  4. SOB (shortness of breath)    [name], FNP

## 2020-10-21 NOTE — Telephone Encounter (Signed)
Per Jhordyn okay to talk to CNA she is aware what abx was called in for and she should not go back on potassium until level is checked again

## 2020-10-21 NOTE — Patient Instructions (Signed)
Tremor A tremor is trembling or shaking that you cannot control. Most tremors affect the hands or arms. Tremors can also affect the head, vocal cords, face, and other parts of the body. There are many types of tremors. Common types include: Essential tremor. These usually occur in people older than 40. It may run in families and can happen in otherwise healthy people. Resting tremor. These occur when the muscles are at rest, such as when your hands are resting in your lap. People with Parkinson's disease often have resting tremors. Postural tremor. These occur when you try to hold a pose, such as keeping your hands outstretched. Kinetic tremor. These occur during purposeful movement, such as trying to touch a finger to your nose. Task-specific tremor. These may occur when you perform certain tasks such as writing, speaking, or standing. Psychogenic tremor. These dramatically lessen or disappear when you are distracted. They can happen in people of all ages. Some types of tremors have no known cause. Tremors can also be a symptom of nervous system problems (neurological disorders) that may occur with aging. Some tremors go away with treatment, while othersdo not. Follow these instructions at home: Lifestyle     Limit alcohol intake to no more than 1 drink a day for nonpregnant women and 2 drinks a day for men. One drink equals 12 oz of beer, 5 oz of wine, or 1 oz of hard liquor. Do not use any products that contain nicotine or tobacco, such as cigarettes and e-cigarettes. If you need help quitting, ask your health care provider. Avoid extreme heat and extreme cold. Limit your caffeine intake, as told by your health care provider. Try to get 8 hours of sleep each night. Find ways to manage your stress, such as meditation or yoga. General instructions Take over-the-counter and prescription medicines only as told by your health care provider. Keep all follow-up visits as told by your health care  provider. This is important. Contact a health care provider if you: Develop a tremor after starting a new medicine. Have a tremor along with other symptoms such as: Numbness. Tingling. Pain. Weakness. Notice that your tremor gets worse. Notice that your tremor interferes with your day-to-day life. Summary A tremor is trembling or shaking that you cannot control. Most tremors affect the hands or arms. Some types of tremors have no known cause. Others may be a symptom of nervous system problems (neurological disorders). Make sure you discuss any tremors you have with your health care provider. This information is not intended to replace advice given to you by your health care provider. Make sure you discuss any questions you have with your healthcare provider. Document Revised: 11/03/2019 Document Reviewed: 11/03/2019 Elsevier Patient Education  2022 [employer].

## 2020-10-22 LAB — NOVEL CORONAVIRUS, NAA: SARS-CoV-2, NAA: DETECTED — AB

## 2020-10-22 LAB — SARS-COV-2, NAA 2 DAY TAT

## 2020-10-24 DIAGNOSIS — M1990 Unspecified osteoarthritis, unspecified site: Secondary | ICD-10-CM | POA: Diagnosis not present

## 2020-10-25 ENCOUNTER — Ambulatory Visit: Payer: Medicare Other

## 2020-10-29 ENCOUNTER — Other Ambulatory Visit: Payer: Self-pay | Admitting: Family

## 2020-10-29 DIAGNOSIS — F411 Generalized anxiety disorder: Secondary | ICD-10-CM

## 2020-10-29 DIAGNOSIS — F331 Major depressive disorder, recurrent, moderate: Secondary | ICD-10-CM

## 2020-11-05 ENCOUNTER — Other Ambulatory Visit: Payer: Self-pay | Admitting: Family

## 2020-11-12 ENCOUNTER — Telehealth: Payer: Self-pay | Admitting: Family

## 2020-11-14 NOTE — Telephone Encounter (Signed)
Caregiver called stating that they are still waiting to receive call from someone to schedule pt appt for prolia shot.  Please call

## 2020-11-15 MED ORDER — DENOSUMAB 60 MG/ML ~~LOC~~ SOSY: 60.0000 mg | PREFILLED_SYRINGE | Freq: Once | SUBCUTANEOUS | 2 refills | Status: AC

## 2020-11-15 NOTE — Telephone Encounter (Signed)
Prolia ordered.  [name], FNP

## 2020-11-15 NOTE — Telephone Encounter (Signed)
Patient is wanting to know if she is still supposed to be on Prolia. Caretaker states that you had advised her at last visit to start Prolia back. If she is to start back please order and route back to the prolia pool.

## 2020-11-23 DIAGNOSIS — M1990 Unspecified osteoarthritis, unspecified site: Secondary | ICD-10-CM | POA: Diagnosis not present

## 2020-11-25 ENCOUNTER — Other Ambulatory Visit: Payer: Self-pay | Admitting: Family

## 2020-11-25 DIAGNOSIS — F411 Generalized anxiety disorder: Secondary | ICD-10-CM

## 2020-11-25 DIAGNOSIS — F331 Major depressive disorder, recurrent, moderate: Secondary | ICD-10-CM

## 2020-11-28 ENCOUNTER — Other Ambulatory Visit: Payer: Self-pay | Admitting: Family

## 2020-11-28 DIAGNOSIS — G8929 Other chronic pain: Secondary | ICD-10-CM

## 2020-12-05 ENCOUNTER — Other Ambulatory Visit: Payer: Self-pay | Admitting: Family

## 2020-12-06 ENCOUNTER — Telehealth: Payer: Self-pay | Admitting: Family

## 2020-12-06 NOTE — Telephone Encounter (Signed)
Patient's daughter is inquiring about the prolia for her mom.  Has not heard from our office.  Please let her know what she needs to do about this medicine.

## 2020-12-09 ENCOUNTER — Telehealth: Payer: Self-pay

## 2020-12-09 ENCOUNTER — Ambulatory Visit: Payer: Medicare Other | Admitting: Family Medicine

## 2020-12-09 NOTE — Telephone Encounter (Signed)
I called pt's daughter, Sheila [name] (on [place]) to advise her that Sheila [name] needs to complete the 24 hour urine and the blood work and reschedule her appt for 10/24 because those results will not be back. FYI- I left her a VM

## 2020-12-16 ENCOUNTER — Telehealth: Payer: Medicare Other | Admitting: Nurse Practitioner

## 2020-12-17 ENCOUNTER — Telehealth: Payer: Self-pay | Admitting: Family

## 2020-12-17 ENCOUNTER — Other Ambulatory Visit: Payer: Self-pay | Admitting: Nurse Practitioner

## 2020-12-17 ENCOUNTER — Other Ambulatory Visit: Payer: Medicare Other

## 2020-12-17 ENCOUNTER — Other Ambulatory Visit: Payer: Self-pay | Admitting: Family

## 2020-12-17 ENCOUNTER — Other Ambulatory Visit: Payer: Self-pay

## 2020-12-17 DIAGNOSIS — R0789 Other chest pain: Secondary | ICD-10-CM

## 2020-12-17 DIAGNOSIS — G629 Polyneuropathy, unspecified: Secondary | ICD-10-CM

## 2020-12-17 NOTE — Telephone Encounter (Signed)
Referral to Cardiologists sent

## 2020-12-18 ENCOUNTER — Other Ambulatory Visit: Payer: Medicare Other

## 2020-12-18 LAB — PTH, INTACT AND CALCIUM
Calcium: 9.7 mg/dL (ref 8.7–10.3)
PTH: 103 pg/mL — ABNORMAL HIGH (ref 15–65)

## 2020-12-19 LAB — CALCIUM, URINE, 24 HOUR
Calcium, 24H Urine: 120 mg/24 hr (ref 0–320)
Calcium, Urine: 7.3 mg/dL

## 2020-12-19 LAB — CREATININE, URINE, 24 HOUR
Creatinine, 24H Ur: 931 mg/24 hr (ref 800–1800)
Creatinine, Urine: 56.4 mg/dL

## 2020-12-24 ENCOUNTER — Other Ambulatory Visit: Payer: Self-pay

## 2020-12-24 ENCOUNTER — Encounter: Payer: Self-pay | Admitting: Family

## 2020-12-24 ENCOUNTER — Ambulatory Visit (INDEPENDENT_AMBULATORY_CARE_PROVIDER_SITE_OTHER): Payer: Medicare Other | Admitting: Family

## 2020-12-24 VITALS — BP 160/95 | HR 109 | Temp 97.2°F | Wt 236.6 lb

## 2020-12-24 DIAGNOSIS — F331 Major depressive disorder, recurrent, moderate: Secondary | ICD-10-CM

## 2020-12-24 DIAGNOSIS — E782 Mixed hyperlipidemia: Secondary | ICD-10-CM | POA: Diagnosis not present

## 2020-12-24 DIAGNOSIS — M549 Dorsalgia, unspecified: Secondary | ICD-10-CM | POA: Diagnosis not present

## 2020-12-24 DIAGNOSIS — M150 Primary generalized (osteo)arthritis: Secondary | ICD-10-CM

## 2020-12-24 DIAGNOSIS — F1320 Sedative, hypnotic or anxiolytic dependence, uncomplicated: Secondary | ICD-10-CM | POA: Diagnosis not present

## 2020-12-24 DIAGNOSIS — M1990 Unspecified osteoarthritis, unspecified site: Secondary | ICD-10-CM | POA: Diagnosis not present

## 2020-12-24 DIAGNOSIS — Z79899 Other long term (current) drug therapy: Secondary | ICD-10-CM

## 2020-12-24 DIAGNOSIS — J441 Chronic obstructive pulmonary disease with (acute) exacerbation: Secondary | ICD-10-CM | POA: Diagnosis not present

## 2020-12-24 DIAGNOSIS — E210 Primary hyperparathyroidism: Secondary | ICD-10-CM

## 2020-12-24 DIAGNOSIS — G8929 Other chronic pain: Secondary | ICD-10-CM

## 2020-12-24 DIAGNOSIS — F411 Generalized anxiety disorder: Secondary | ICD-10-CM

## 2020-12-24 DIAGNOSIS — M159 Polyosteoarthritis, unspecified: Secondary | ICD-10-CM | POA: Diagnosis not present

## 2020-12-24 DIAGNOSIS — Z0289 Encounter for other administrative examinations: Secondary | ICD-10-CM

## 2020-12-24 DIAGNOSIS — N183 Chronic kidney disease, stage 3 unspecified: Secondary | ICD-10-CM

## 2020-12-24 DIAGNOSIS — I10 Essential (primary) hypertension: Secondary | ICD-10-CM | POA: Diagnosis not present

## 2020-12-24 DIAGNOSIS — Z23 Encounter for immunization: Secondary | ICD-10-CM | POA: Diagnosis not present

## 2020-12-24 DIAGNOSIS — E039 Hypothyroidism, unspecified: Secondary | ICD-10-CM

## 2020-12-24 DIAGNOSIS — L282 Other prurigo: Secondary | ICD-10-CM

## 2020-12-24 LAB — CMP14+EGFR
ALT: 24 IU/L (ref 0–32)
AST: 27 IU/L (ref 0–40)
Albumin/Globulin Ratio: 1.8 (ref 1.2–2.2)
Albumin: 4.6 g/dL (ref 3.7–4.7)
Alkaline Phosphatase: 64 IU/L (ref 44–121)
BUN/Creatinine Ratio: 22 (ref 12–28)
BUN: 29 mg/dL — ABNORMAL HIGH (ref 8–27)
Bilirubin Total: 0.5 mg/dL (ref 0.0–1.2)
CO2: 20 mmol/L (ref 20–29)
Calcium: 10.2 mg/dL (ref 8.7–10.3)
Chloride: 98 mmol/L (ref 96–106)
Creatinine, Ser: 1.29 mg/dL — ABNORMAL HIGH (ref 0.57–1.00)
Globulin, Total: 2.6 g/dL (ref 1.5–4.5)
Glucose: 138 mg/dL — ABNORMAL HIGH (ref 70–99)
Potassium: 4.5 mmol/L (ref 3.5–5.2)
Sodium: 139 mmol/L (ref 134–144)
Total Protein: 7.2 g/dL (ref 6.0–8.5)
eGFR: 42 mL/min/{1.73_m2} — ABNORMAL LOW (ref >59)

## 2020-12-24 MED ORDER — ALPRAZOLAM 0.5 MG PO TABS: 0.5000 mg | ORAL_TABLET | Freq: Every evening | ORAL | 2 refills | Status: DC | PRN

## 2020-12-24 MED ORDER — HYDROXYZINE PAMOATE 25 MG PO CAPS: 25.0000 mg | ORAL_CAPSULE | Freq: Three times a day (TID) | ORAL | 1 refills | Status: DC | PRN

## 2020-12-24 MED ORDER — TRAMADOL HCL 50 MG PO TABS: ORAL_TABLET | ORAL | 2 refills | Status: DC

## 2020-12-24 MED ORDER — PREDNISONE 10 MG (21) PO TBPK: ORAL_TABLET | ORAL | 0 refills | Status: DC

## 2020-12-24 MED ORDER — TRIAMCINOLONE ACETONIDE 0.5 % EX OINT: 1.0000 "application " | TOPICAL_OINTMENT | Freq: Two times a day (BID) | CUTANEOUS | 0 refills | Status: DC

## 2020-12-24 NOTE — Patient Instructions (Signed)
Pruritus Pruritus is an itchy feeling on the skin. One of the most common causes is dry skin, but many different things can cause itching. Most cases of itching do notrequire medical attention. Sometimes itchy skin can turn into a rash. Follow these instructions at home: Skin care  Apply moisturizing lotion to your skin as needed. Lotion that contains petroleum jelly is best. Take medicines or apply medicated creams only as told by your health care provider. This may include: Corticosteroid cream. Anti-itch lotions. Oral antihistamines. Apply a cool, wet cloth (cool compress) to the affected areas. Take baths with one of the following: Epsom salts. You can get these at your local pharmacy or grocery store. Follow the instructions on the packaging. Baking soda. Pour a small amount into the bath as told by your health care provider. Colloidal oatmeal. You can get this at your local pharmacy or grocery store. Follow the instructions on the packaging. Apply baking soda paste to your skin. To make the paste, stir water into a small amount of baking soda until it reaches a paste-like consistency. Do not scratch your skin. Do not take hot showers or baths, which can make itching worse. A cool shower may help with itching as long as you apply moisturizing lotion after the shower. Do not use scented soaps, detergents, perfumes, and cosmetic products. Instead, use gentle, unscented versions of these items.  General instructions Avoid wearing tight clothes. Keep a journal to help find out what is causing your itching. Write down: What you eat and drink. What cosmetic products you use. What soaps or detergents you use. What you wear, including jewelry. Use a humidifier. This keeps the air moist, which helps to prevent dry skin. Be aware of any changes in your itchiness. Contact a health care provider if: The itching does not go away after several days. You are unusually thirsty or urinating more  than normal. Your skin tingles or feels numb. Your skin or the white parts of your eyes turn yellow (jaundice). You feel weak. You have any of the following: Night sweats. Tiredness (fatigue). Weight loss. Abdominal pain. Summary Pruritus is an itchy feeling on the skin. One of the most common causes is dry skin, but many different conditions and factors can cause itching. Apply moisturizing lotion to your skin as needed. Lotion that contains petroleum jelly is best. Take medicines or apply medicated creams only as told by your health care provider. Do not take hot showers or baths. Do not use scented soaps, detergents, perfumes, or cosmetic products. This information is not intended to replace advice given to you by your health care provider. Make sure you discuss any questions you have with your healthcare provider. Document Revised: 02/23/2017 Document Reviewed: 02/23/2017 Elsevier Patient Education  2022 [employer] Inc[employer]  

## 2020-12-24 NOTE — Progress Notes (Signed)
Subjective:    Patient ID: Sheila [name], female    DOB: 1940/02/28, 80 y.o.   MRN: [id number]  Chief Complaint  Patient presents with   Medical Management of Chronic Issues   Rash    Rash on back   Pt presents to the office today for chronic follow up. She is followed by Cardiologists every 3 months for CAD and CHF. She is currently  has an aid and family that stays with her 24/7.   She reports she is having a hard time walking long distances. She has to use a rolling walker at all times, however, she can not walk more than a few feet without having to relax.    She saw an Endocrinologists for hypercalcemia & hyperparathyroidism. She is followed by Dermatologists for skin lesion.   She has COPD and states her breathing is stable as long as she it sitting and does not have to wear a mask.  She has CKD and tries to limit NSAID's. Rash This is a new problem. The current episode started 1 to 4 weeks ago. The problem has been gradually worsening since onset. The affected locations include the chest, back, abdomen, left axilla, left arm, right axilla and right arm. The rash is characterized by itchiness. She was exposed to nothing. Associated symptoms include fatigue. Pertinent negatives include no shortness of breath. Past treatments include anti-itch cream and moisturizer. The treatment provided mild relief.  Hypertension This is a chronic problem. The current episode started more than 1 year ago. The problem has been waxing and waning since onset. The problem is uncontrolled. Associated symptoms include anxiety, malaise/fatigue and peripheral edema. Pertinent negatives include no shortness of breath. Risk factors for coronary artery disease include obesity and sedentary lifestyle. Identifiable causes of hypertension include a thyroid problem.  Thyroid Problem Presents for follow-up visit. Symptoms include anxiety and fatigue. Patient reports no depressed mood, hoarse voice or visual change.  The symptoms have been stable. Her past medical history is significant for hyperlipidemia.  Arthritis Presents for follow-up visit. She complains of pain and stiffness. The symptoms have been stable. Affected locations include the right knee, left knee, left MCP and right MCP. Her pain is at a severity of 0/10. Associated symptoms include fatigue and rash.  Hyperlipidemia This is a chronic problem. The current episode started more than 1 year ago. Pertinent negatives include no shortness of breath.  Anxiety Presents for follow-up visit. Symptoms include excessive worry, irritability and nervous/anxious behavior. Patient reports no depressed mood, restlessness or shortness of breath. Symptoms occur most days.    Depression        This is a chronic problem.  The current episode started more than 1 year ago.   The onset quality is gradual.   The problem occurs intermittently.  Associated symptoms include fatigue and sad.  Associated symptoms include no helplessness, no hopelessness, not irritable and no restlessness.  Past medical history includes thyroid problem and anxiety.      Review of Systems  Constitutional:  Positive for fatigue, irritability and malaise/fatigue.  HENT:  Negative for hoarse voice.   Respiratory:  Negative for shortness of breath.   Musculoskeletal:  Positive for arthritis and stiffness.  Skin:  Positive for rash.  Psychiatric/Behavioral:  Positive for depression. The patient is nervous/anxious.   All other systems reviewed and are negative.     Objective:   Physical Exam Vitals reviewed.  Constitutional:      General: She is not irritable.She is  not in acute distress.    Appearance: She is well-developed. She is obese.  HENT:     Head: Normocephalic and atraumatic.     Right Ear: Tympanic membrane normal.     Left Ear: Tympanic membrane normal.  Eyes:     Pupils: Pupils are equal, round, and reactive to light.  Neck:     Thyroid: No thyromegaly.   Cardiovascular:     Rate and Rhythm: Normal rate and regular rhythm.     Heart sounds: Normal heart sounds. No murmur heard. Pulmonary:     Effort: Pulmonary effort is normal. No respiratory distress.     Breath sounds: Normal breath sounds. No wheezing.  Abdominal:     General: Bowel sounds are normal. There is no distension.     Palpations: Abdomen is soft.     Tenderness: There is no abdominal tenderness.  Musculoskeletal:        General: No tenderness.     Cervical back: Normal range of motion and neck supple.  Skin:    General: Skin is warm and dry.     Findings: Rash present.       Neurological:     Mental Status: She is alert and oriented to person, place, and time.     Cranial Nerves: No cranial nerve deficit.     Motor: Weakness present.     Gait: Gait abnormal.     Deep Tendon Reflexes: Reflexes are normal and symmetric.     Comments: Using rolling walker  Psychiatric:        Behavior: Behavior normal.        Thought Content: Thought content normal.        Judgment: Judgment normal.     BP (!) 160/95   Pulse (!) 109   Temp (!) 97.2 F (36.2 C) (Temporal)   Wt 236 lb 9.6 oz (107.3 kg)   BMI 47.79 kg/m       Assessment & Plan:  Sheila [name] comes in today with chief complaint of Medical Management of Chronic Issues and Rash (Rash on back)   Diagnosis and orders addressed:  1. Benzodiazepine dependence (HCC) - ALPRAZolam (XANAX) 0.5 MG tablet; Take 1 tablet (0.5 mg total) by mouth at bedtime as needed for anxiety.  Dispense: 30 tablet; Refill: 2 - CMP14+EGFR  2. GAD (generalized anxiety disorder)  - ALPRAZolam (XANAX) 0.5 MG tablet; Take 1 tablet (0.5 mg total) by mouth at bedtime as needed for anxiety.  Dispense: 30 tablet; Refill: 2 - CMP14+EGFR  3. Controlled substance agreement signed - ALPRAZolam (XANAX) 0.5 MG tablet; Take 1 tablet (0.5 mg total) by mouth at bedtime as needed for anxiety.  Dispense: 30 tablet; Refill: 2 - CMP14+EGFR  4.  Chronic bilateral back pain, unspecified back location - traMADol (ULTRAM) 50 MG tablet; TAKE TWO TABLETS EVERY TWELVE HOURS AS NEEDED FOR PAIN  Dispense: 30 tablet; Refill: 2 - CMP14+EGFR  5. Need for immunization against influenza - Flu Vaccine QUAD High Dose(Fluad) - CMP14+EGFR  6. Essential hypertension, benign - CMP14+EGFR  7. Chronic obstructive pulmonary disease with acute exacerbation (HCC) - CMP14+EGFR  8. Primary hyperparathyroidism (HCC) - CMP14+EGFR  9. Hypothyroidism, unspecified type - CMP14+EGFR  10. Primary osteoarthritis involving multiple joints - CMP14+EGFR  11. Stage 3 chronic kidney disease, unspecified whether stage 3a or 3b CKD (HCC) - CMP14+EGFR  12. Mixed hyperlipidemia - CMP14+EGFR  13. Moderate episode of recurrent major depressive disorder (HCC) - CMP14+EGFR  14. Morbid obesity ([place]) - CMP14+EGFR  15. Pain medication agreement signed  - CMP14+EGFR  16. Pruritic rash Start prednisone and Kenalog  Vistaril as needed  - CMP14+EGFR - predniSONE (STERAPRED UNI-PAK 21 TAB) 10 MG (21) TBPK tablet; Use as directed  Dispense: 21 tablet; Refill: 0 - hydrOXYzine (VISTARIL) 25 MG capsule; Take 1 capsule (25 mg total) by mouth 3 (three) times daily as needed.  Dispense: 90 capsule; Refill: 1 - triamcinolone ointment (KENALOG) 0.5 %; Apply 1 application topically 2 (two) times daily.  Dispense: 30 g; Refill: 0   Labs pending Patient reviewed in [place] controlled database, no flags noted. Contract and drug screen are up to date.  Health Maintenance reviewed Diet and exercise encouraged  Follow up plan: 3 months   [name], FNP

## 2020-12-30 ENCOUNTER — Ambulatory Visit (INDEPENDENT_AMBULATORY_CARE_PROVIDER_SITE_OTHER): Payer: Medicare Other

## 2020-12-30 ENCOUNTER — Other Ambulatory Visit: Payer: Self-pay | Admitting: Family

## 2020-12-30 VITALS — Ht 59.0 in | Wt 236.0 lb

## 2020-12-30 DIAGNOSIS — F411 Generalized anxiety disorder: Secondary | ICD-10-CM

## 2020-12-30 DIAGNOSIS — Z0000 Encounter for general adult medical examination without abnormal findings: Secondary | ICD-10-CM

## 2020-12-30 DIAGNOSIS — F331 Major depressive disorder, recurrent, moderate: Secondary | ICD-10-CM

## 2020-12-30 NOTE — Progress Notes (Signed)
Subjective:   Sheila [name] is a 80 y.o. female who presents for Medicare Annual (Subsequent) preventive examination.  Virtual Visit via Telephone Note  I connected with  Sheila [name] on 12/30/20 at  1:15 PM EST by telephone and verified that I am speaking with the correct person using two identifiers.  Location: Patient: Home Provider: [facility] Persons participating in the virtual visit: patient/Nurse Health Advisor   I discussed the limitations, risks, security and privacy concerns of performing an evaluation and management service by telephone and the availability of in person appointments. The patient expressed understanding and agreed to proceed.  Interactive audio and video telecommunications were attempted between this nurse and patient, however failed, due to patient having technical difficulties OR patient did not have access to video capability.  We continued and completed visit with audio only.  Some vital signs may be absent or patient reported.   Sheila [name] E Zerrick Hanssen, LPN   Review of Systems     Cardiac Risk Factors include: advanced age (>84men, >13 women);dyslipidemia;hypertension;sedentary lifestyle;obesity (BMI >30kg/m2);Other (see comment), Risk factor comments: cardiomyopathy, COPD, atherosclerosis     Objective:    Today's Vitals   12/30/20 1322  Weight: 236 lb (107 kg)  Height: 4\' 11"  (1.499 m)   Body mass index is 47.67 kg/m.  Advanced Directives 12/30/2020 12/28/2019 10/24/2019 10/02/2019 12/27/2018 08/29/2018 03/29/2018  Does Patient Have a Medical Advance Directive? Yes No No No No No No  Type of Sheila [name];Living will - - - - - -  Does patient want to make changes to medical advance directive? - - - - - - -  Copy of Sheila [name] in Chart? Yes - validated most recent copy scanned in chart (See row information) - - - - - -  Would patient like information on creating a medical advance directive? - No - Patient declined -  No - Patient declined No - Patient declined - -  Pre-existing out of facility DNR order (yellow form or pink MOST form) - - - - - - -    Current Medications (verified) Outpatient Encounter Medications as of 12/30/2020  Medication Sig   albuterol (PROVENTIL) (2.5 MG/3ML) 0.083% nebulizer solution NEBULIZE 1 VIAL EVERY 6-8 HOURS AS NEEDED FOR WHEEZING OR SHORTNESS OF BREATH   ALPRAZolam (XANAX) 0.5 MG tablet Take 1 tablet (0.5 mg total) by mouth at bedtime as needed for anxiety.   Cholecalciferol (VITAMIN D3) 25 MCG (1000 UT) CAPS Take 1,000 Units by mouth daily.    cinacalcet (SENSIPAR) 30 MG tablet Take 1 tablet (30 mg total) by mouth every other day.   DULoxetine (CYMBALTA) 60 MG capsule TAKE 1 CAPUSLE ONCE DAILY   ENTRESTO 49-51 MG TAKE 1 TABLET 2 TIMES A DAY   gabapentin (NEURONTIN) 300 MG capsule TAKE 1 CAPSULE 3 TIMES A DAY   Garlic 10 MG CAPS Take by mouth.   hydrOXYzine (VISTARIL) 25 MG capsule Take 1 capsule (25 mg total) by mouth 3 (three) times daily as needed.   levocetirizine (XYZAL) 5 MG tablet TAKE 1 TABLET ONCE DAILY IN THE EVENING   levothyroxine (SYNTHROID) 88 MCG tablet TAKE 1 TABLET DAILY   LIVALO 4 MG TABS TAKE 1 TABLET DAILY   Omega-3 Fatty Acids (FISH OIL OMEGA-3 PO) Take 1 capsule by mouth every evening.    pantoprazole (PROTONIX) 20 MG tablet TAKE ONE TABLET AT BEDTIME   spironolactone (ALDACTONE) 25 MG tablet TAKE 1 TABLET ONCE DAILY   SYMBICORT  160-4.5 MCG/ACT inhaler INHALE 2 PUFFS TWICE DAILY   torsemide (DEMADEX) 20 MG tablet TAKE 2 TABLETS DAILY AS DIRECTED   traMADol (ULTRAM) 50 MG tablet TAKE TWO TABLETS EVERY TWELVE HOURS AS NEEDED FOR PAIN   diclofenac sodium (VOLTAREN) 1 % GEL Apply 2 g topically 4 (four) times daily. (Patient not taking: Reported on 12/30/2020)   fluticasone (FLONASE) 50 MCG/ACT nasal spray PLACE 2 SPRAYS INTO BOTH NOSTRILS DAILY (Patient not taking: Reported on 12/30/2020)   fluticasone furoate-vilanterol (BREO ELLIPTA) 100-25 MCG/ACT AEPB  Inhale 1 puff into the lungs daily. (Patient not taking: Reported on 12/30/2020)   Multiple Vitamin (MULTIVITAMIN WITH MINERALS) TABS tablet Take 1 tablet by mouth daily. (Patient not taking: Reported on 12/30/2020)   nystatin (MYCOSTATIN/NYSTOP) powder Apply 1 application topically 3 (three) times daily. (Patient not taking: Reported on 12/30/2020)   Tetrahydrozoline HCl (VISINE OP) Place 1 drop into both eyes daily as needed (dry eyes/itching). (Patient not taking: Reported on 12/30/2020)   triamcinolone ointment (KENALOG) 0.5 % Apply 1 application topically 2 (two) times daily. (Patient not taking: Reported on 12/30/2020)   [DISCONTINUED] predniSONE (STERAPRED UNI-PAK 21 TAB) 10 MG (21) TBPK tablet Use as directed   No facility-administered encounter medications on file as of 12/30/2020.    Allergies (verified) Ibuprofen, Benadryl [diphenhydramine hcl], Lipitor [atorvastatin], Baclofen, Hydrocodone, Codeine, Morphine and related, and Tdap [tetanus-diphth-acell pertussis]   History: Past Medical History:  Diagnosis Date   Allergy    Bell's palsy    Cancer ([place])    skin CA on face   Cataract    Chest pain 07/2009   Myoveiw stress test negative.   Depression    Diverticulosis of colon    DJD (degenerative joint disease) of knee    Dyspnea    on exertion   Family history of adverse reaction to anesthesia    sister ha nausea/vomiting   Gastric ulcer with hemorrhage 01/14/2011   GERD (gastroesophageal reflux disease)    Headache    History of fractured pelvis    Hypercholesteremia    Hypertension    Hypothyroidism    LV dysfunction    UTI (lower urinary tract infection)    Past Surgical History:  Procedure Laterality Date   APPENDECTOMY     BREAST BIOPSY Left    ESOPHAGOGASTRODUODENOSCOPY  01/14/2011   Procedure: ESOPHAGOGASTRODUODENOSCOPY (EGD);  Surgeon: Sheila Houston, MD;  Location: [facility];  Service: Endoscopy;  Laterality: N/A;   HIP SURGERY     pelvis   JOINT  REPLACEMENT     KNEE SURGERY  04/2010.   Total left knee replacement   LAPAROSCOPIC APPENDECTOMY N/A 08/01/2013   Procedure: APPENDECTOMY LAPAROSCOPIC;  Surgeon: Jamesetta So, MD;  Location: [facility];  Service: General;  Laterality: N/A;   RIGHT/LEFT HEART CATH AND CORONARY ANGIOGRAPHY N/A 10/02/2019   Procedure: RIGHT/LEFT HEART CATH AND CORONARY ANGIOGRAPHY;  Surgeon: Lorretta Harp, MD;  Location: [facility];  Service: Cardiovascular;  Laterality: N/A;   Family History  Problem Relation Age of Onset   Aneurysm Mother    Stroke Mother    Hypertension Mother    Cancer Father        lung   Heart disease Sister    Hip fracture Sister    Cancer Brother    Alzheimer's disease Sister    Cancer Sister        skin, uterine   Cancer Brother    Social History   Socioeconomic History   Marital  status: Widowed    Spouse name: Not on file   Number of children: 2   Years of education: 0   Highest education level: Never attended school  Occupational History   Occupation: retired  Tobacco Use   Smoking status: Former    Types: Cigarettes    Quit date: 02/24/1968    Years since quitting: 52.8   Smokeless tobacco: Current    Types: Snuff   Tobacco comments:    quit yrs and yrs ago when she was very young  Media planner   Vaping Use: Never used  Substance and Sexual Activity   Alcohol use: No   Drug use: No   Sexual activity: Not Currently    Birth control/protection: Post-menopausal  Other Topics Concern   Not on file  Social History Narrative   Lives alone, but has 24 hour caregiver, palliative care   Daughter lives nearby and helps her with paying bills, shopping, etc.   Social Determinants of Health   Financial Resource Strain: Low Risk    Difficulty of Paying Living Expenses: Not very hard  Food Insecurity: No Food Insecurity   Worried About [name] in the Last Year: Never true   [place] in the Last Year: Never true  Transportation Needs: No  Transportation Needs   Lack of Transportation (Medical): No   Lack of Transportation (Non-Medical): No  Physical Activity: Inactive   Days of Exercise per Week: 0 days   Minutes of Exercise per Session: 0 min  Stress: No Stress Concern Present   Feeling of Stress : Only a little  Social Connections: Socially Isolated   Frequency of Communication with Friends and Family: More than three times a week   Frequency of Social Gatherings with Friends and Family: More than three times a week   Attends Religious Services: Never   Marine scientist or Organizations: No   Attends Archivist Meetings: Never   Marital Status: Widowed    Tobacco Counseling Ready to quit: Not Answered Counseling given: Not Answered Tobacco comments: quit yrs and yrs ago when she was very young   Clinical Intake:  Pre-visit preparation completed: Yes  Pain : No/denies pain     BMI - recorded: 47.67 Nutritional Status: BMI > 30  Obese Nutritional Risks: None Diabetes: No  How often do you need to have someone help you when you read instructions, pamphlets, or other written materials from your doctor or pharmacy?: 1 - Never  Diabetic? no  Interpreter Needed?: No  Information entered by :: Brodi Nery, LPN   Activities of Daily Living In your present state of health, do you have any difficulty performing the following activities: 12/30/2020  Hearing? Y  Vision? N  Difficulty concentrating or making decisions? Y  Walking or climbing stairs? Y  Dressing or bathing? Y  Doing errands, shopping? Y  Preparing Food and eating ? Y  Using the Toilet? N  In the past six months, have you accidently leaked urine? Y  Comment mild  Do you have problems with loss of bowel control? N  Managing your Medications? Y  Comment children set up medications - she has 24 hour caregiver  Managing your Finances? Y  Comment daughter, Barnetta Chapel pays  Housekeeping or managing your Housekeeping? Y  Some  recent data might be hidden    Patient Care Team: Sharion Balloon, FNP as PCP - General (Family Medicine) Sheila Houston, MD as Consulting Physician (Gastroenterology) [place],  Annita Brod, MD as Consulting Physician (Cardiology) Ilean China, RN as Registered Nurse Shea Evans, Norva Riffle, LCSW as Social Worker (Licensed Clinical Social Worker)  Indicate any recent [employer] you may have received from other than [facility] providers in the past year (date may be approximate).     Assessment:   This is a routine wellness examination for [place].  Hearing/Vision screen Hearing Screening - Comments:: Has hearing aid for right ear, but doesn't usually wear it Vision Screening - Comments:: Wears rx glasses prn - up to date with annual eye exams at [facility] in [place]  Dietary issues and exercise activities discussed: Current Exercise Habits: The patient does not participate in regular exercise at present, Exercise limited by: orthopedic condition(s);cardiac condition(s);respiratory conditions(s)   Goals Addressed               This Visit's Progress     DIET - INCREASE WATER INTAKE   Not on track     Try to drink 6-8 glasses of water daily      Patient Stated (pt-stated)   On track     Set an alarm or ask aid to help remember to take evening medications each day.        Depression Screen PHQ 2/9 Scores 12/30/2020 12/24/2020 09/06/2020 07/02/2020 06/17/2020 06/17/2020 03/25/2020  PHQ - 2 Score 0 0 0 0 0 0 0  PHQ- 9 Score 3 0 - 0 - - 1    Fall Risk Fall Risk  12/30/2020 12/24/2020 09/06/2020 07/02/2020 07/02/2020  Falls in the past year? 0 0 0 0 0  Number falls in past yr: 0 - - - -  Injury with Fall? 0 - - - -  Comment - - - - -  Risk for fall due to : Impaired balance/gait;Impaired mobility;Medication side effect;Orthopedic patient - - - -  Follow up Education provided;Falls prevention discussed - - - -    FALL RISK PREVENTION PERTAINING TO THE HOME:  Any stairs in  or around the home? No  If so, are there any without handrails? No  Home free of loose throw rugs in walkways, pet beds, electrical cords, etc? Yes  Adequate lighting in your home to reduce risk of falls? Yes   ASSISTIVE DEVICES UTILIZED TO PREVENT FALLS:  Life alert? No  Use of a cane, walker or w/c? Yes  Grab bars in the bathroom? Yes  Shower chair or bench in shower? Yes  Elevated toilet seat or a handicapped toilet? Yes   TIMED UP AND GO:  Was the test performed? No . Telephonic visit  Cognitive Function: Cognitive status assessed by direct observation. Patient has current diagnosis of cognitive impairment. Patient is unable to complete screening 6CIT or MMSE.    MMSE - Mini Mental State Exam 12/22/2017 07/08/2016 07/10/2015 07/06/2014 07/06/2014  Not completed: Unable to complete Unable to complete Unable to complete - -  Orientation to time - 3 4 5 5   Orientation to Place - 3 5 2 2   Registration - 3 3 3 3   Attention/ Calculation - 0 0 0 0  Recall - 3 3 3 3   Language- name 2 objects - 2 2 2  -  Language- repeat - 1 1 1  -  Language- follow 3 step command - 3 3 3  -  Language- read & follow direction - 0 1 0 -  Language-read & follow direction-comments - - - patient is not able to compete this question because does not read -  Write a sentence - 0 0 0 -  Write a sentence-comments - - - unable to complete this question b/c does not read -  Copy design - 1 0 1 -  Total score - 19 22 20  -     6CIT Screen 12/28/2019 12/27/2018 12/22/2017 12/22/2017  What Year? 0 points 0 points 4 points 0 points  What month? 3 points 3 points 3 points 0 points  What time? 0 points 0 points - 0 points  Count back from 20 4 points 4 points 4 points 0 points  Months in reverse 4 points 4 points 4 points 0 points  Repeat phrase 6 points 8 points - 2 points  Total Score 17 19 - 2    Immunizations Immunization History  Administered Date(s) Administered   Fluad Quad(high Dose 65+) 02/15/2020,  12/24/2020   Influenza, High Dose Seasonal PF 02/10/2016, 12/15/2016, 12/09/2017   Influenza, Seasonal, Injecte, Preservative Fre 11/29/2008, 12/10/2009, 11/30/2011, 04/05/2012   Influenza,inj,Quad PF,6+ Mos 11/15/2012, 12/11/2013, 01/07/2015, 01/05/2019, 01/20/2019   Influenza-Unspecified 02/10/2016, 12/15/2016, 12/09/2017, 01/05/2019   Pneumococcal Conjugate-13 07/06/2014   Pneumococcal Polysaccharide-23 12/12/2007, 12/15/2016   Td 04/06/1989, 06/03/1989   Tdap 02/23/2006   Zoster Recombinat (Shingrix) 09/23/2020   Zoster, Live 09/02/2010, 01/07/2015    TDAP status: Due, Education has been provided regarding the importance of this vaccine. Advised may receive this vaccine at local pharmacy or Health Dept. Aware to provide a copy of the vaccination record if obtained from local pharmacy or Health Dept. Verbalized acceptance and understanding.  Flu Vaccine status: Up to date  Pneumococcal vaccine status: Up to date  Covid-19 vaccine status: Declined, Education has been provided regarding the importance of this vaccine but patient still declined. Advised may receive this vaccine at local pharmacy or Health Dept.or vaccine clinic. Aware to provide a copy of the vaccination record if obtained from local pharmacy or Health Dept. Verbalized acceptance and understanding.  Qualifies for Shingles Vaccine? Yes   Zostavax completed Yes   Shingrix Completed?: No.    Education has been provided regarding the importance of this vaccine. Patient has been advised to call insurance company to determine out of pocket expense if they have not yet received this vaccine. Advised may also receive vaccine at local pharmacy or Health Dept. Verbalized acceptance and understanding.  Screening Tests Health Maintenance  Topic Date Due   DEXA SCAN  12/23/2019   COVID-19 Vaccine (1) 01/09/2021 (Originally 01/11/1941)   MAMMOGRAM  03/25/2021 (Originally 01/05/2020)   Zoster Vaccines- Shingrix (2 of 2) 03/26/2021  (Originally 11/18/2020)   Pneumonia Vaccine 1+ Years old  Completed   INFLUENZA VACCINE  Completed   Hepatitis C Screening  Completed   HPV VACCINES  Aged Out    Health Maintenance  Health Maintenance Due  Topic Date Due   DEXA SCAN  12/23/2019    Colorectal cancer screening: No longer required.   Mammogram status: No longer required due to age.  Bone Density status: Completed 12/22/2017. Results reflect: Bone density results: OSTEOPENIA. Repeat every 2 years.  Lung Cancer Screening: (Low Dose CT Chest recommended if Age 19-80 years, 30 pack-year currently smoking OR have quit w/in 15years.) does not qualify  Additional Screening:  Hepatitis C Screening: does not qualify  Vision Screening: Recommended annual ophthalmology exams for early detection of glaucoma and other disorders of the eye. Is the patient up to date with their annual eye exam?  Yes  Who is the provider or what is the name of the office in  which the patient attends annual eye exams? [facility] If pt is not established with a provider, would they like to be referred to a provider to establish care? No .   Dental Screening: Recommended annual dental exams for proper oral hygiene  Community Resource Referral / Chronic Care Management: CRR required this visit?  No   CCM required this visit?  No      Plan:     I have personally reviewed and noted the following in the patient's chart:   Medical and social history Use of alcohol, tobacco or illicit drugs  Current medications and supplements including opioid prescriptions.  Functional ability and status Nutritional status Physical activity Advanced directives List of other physicians Hospitalizations, surgeries, and ER visits in previous 12 months Vitals Screenings to include cognitive, depression, and falls Referrals and appointments  In addition, I have reviewed and discussed with patient certain preventive protocols, quality metrics, and best practice  recommendations. A written personalized care plan for preventive services as well as general preventive health recommendations were provided to patient.     Sandrea Hammond, LPN   11/0/3159   Nurse Notes: None

## 2020-12-30 NOTE — Patient Instructions (Signed)
Sheila [name] , Thank you for taking time to come for your Medicare Wellness Visit. I appreciate your ongoing commitment to your health goals. Please review the following plan we discussed and let me know if I can assist you in the future.   Screening recommendations/referrals: Colonoscopy: done 03/26/2012 - no repeat Mammogram: Done 01/04/2018 - no repeat Bone Density: Done 12/22/2017 - Repeat every 2 years  Recommended yearly ophthalmology/optometry visit for glaucoma screening and checkup Recommended yearly dental visit for hygiene and checkup  Vaccinations: Influenza vaccine: done 12/24/2020 - Repeat annually  Pneumococcal vaccine: Done 07/06/2014 & 12/15/2016 Tdap vaccine: Done 2008 - Repeat in 10 years  Shingles vaccine: Done 09/23/2020 - due for second dose   Covid-19:Declined  Advanced directives: in chart   Conditions/risks identified: Aim for 30 minutes of exercise daily - seated leg raises, arm raises, stretching, strength and balance exercises, drink 6-8 glasses of water and eat lots of fruits and vegetables.   Next appointment: Follow up in one year for your annual wellness visit    Preventive Care 65 Years and Older, Female Preventive care refers to lifestyle choices and visits with your health care provider that can promote health and wellness. What does preventive care include? A yearly physical exam. This is also called an annual well check. Dental exams once or twice a year. Routine eye exams. Ask your health care provider how often you should have your eyes checked. Personal lifestyle choices, including: Daily care of your teeth and gums. Regular physical activity. Eating a healthy diet. Avoiding tobacco and drug use. Limiting alcohol use. Practicing safe sex. Taking low-dose aspirin every day. Taking vitamin and mineral supplements as recommended by your health care provider. What happens during an annual well check? The services and screenings done by your health  care provider during your annual well check will depend on your age, overall health, lifestyle risk factors, and family history of disease. Counseling  Your health care provider may ask you questions about your: Alcohol use. Tobacco use. Drug use. Emotional well-being. Home and relationship well-being. Sexual activity. Eating habits. History of falls. Memory and ability to understand (cognition). Work and work Statistician. Reproductive health. Screening  You may have the following tests or measurements: Height, weight, and BMI. Blood pressure. Lipid and cholesterol levels. These may be checked every 5 years, or more frequently if you are over 2 years old. Skin check. Lung cancer screening. You may have this screening every year starting at age 58 if you have a 30-pack-year history of smoking and currently smoke or have quit within the past 15 years. Fecal occult blood test (FOBT) of the stool. You may have this test every year starting at age 84. Flexible sigmoidoscopy or colonoscopy. You may have a sigmoidoscopy every 5 years or a colonoscopy every 10 years starting at age 50. Hepatitis C blood test. Hepatitis B blood test. Sexually transmitted disease (STD) testing. Diabetes screening. This is done by checking your blood sugar (glucose) after you have not eaten for a while (fasting). You may have this done every 1-3 years. Bone density scan. This is done to screen for osteoporosis. You may have this done starting at age 66. Mammogram. This may be done every 1-2 years. Talk to your health care provider about how often you should have regular mammograms. Talk with your health care provider about your test results, treatment options, and if necessary, the need for more tests. Vaccines  Your health care provider may recommend certain vaccines, such as:  Influenza vaccine. This is recommended every year. Tetanus, diphtheria, and acellular pertussis (Tdap, Td) vaccine. You may need a Td  booster every 10 years. Zoster vaccine. You may need this after age 25. Pneumococcal 13-valent conjugate (PCV13) vaccine. One dose is recommended after age 81. Pneumococcal polysaccharide (PPSV23) vaccine. One dose is recommended after age 56. Talk to your health care provider about which screenings and vaccines you need and how often you need them. This information is not intended to replace advice given to you by your health care provider. Make sure you discuss any questions you have with your health care provider. Document Released: 03/08/2015 Document Revised: 10/30/2015 Document Reviewed: 12/11/2014 Elsevier Interactive Patient Education  2017 [place] Prevention in the Home Falls can cause injuries. They can happen to people of all ages. There are many things you can do to make your home safe and to help prevent falls. What can I do on the outside of my home? Regularly fix the edges of walkways and driveways and fix any cracks. Remove anything that might make you trip as you walk through a door, such as a raised step or threshold. Trim any bushes or trees on the path to your home. Use bright outdoor lighting. Clear any walking paths of anything that might make someone trip, such as rocks or tools. Regularly check to see if handrails are loose or broken. Make sure that both sides of any steps have handrails. Any raised decks and porches should have guardrails on the edges. Have any leaves, snow, or ice cleared regularly. Use sand or salt on walking paths during winter. Clean up any spills in your garage right away. This includes oil or grease spills. What can I do in the bathroom? Use night lights. Install grab bars by the toilet and in the tub and shower. Do not use towel bars as grab bars. Use non-skid mats or decals in the tub or shower. If you need to sit down in the shower, use a plastic, non-slip stool. Keep the floor dry. Clean up any water that spills on the floor  as soon as it happens. Remove soap buildup in the tub or shower regularly. Attach bath mats securely with double-sided non-slip rug tape. Do not have throw rugs and other things on the floor that can make you trip. What can I do in the bedroom? Use night lights. Make sure that you have a light by your bed that is easy to reach. Do not use any sheets or blankets that are too big for your bed. They should not hang down onto the floor. Have a firm chair that has side arms. You can use this for support while you get dressed. Do not have throw rugs and other things on the floor that can make you trip. What can I do in the kitchen? Clean up any spills right away. Avoid walking on wet floors. Keep items that you use a lot in easy-to-reach places. If you need to reach something above you, use a strong step stool that has a grab bar. Keep electrical cords out of the way. Do not use floor polish or wax that makes floors slippery. If you must use wax, use non-skid floor wax. Do not have throw rugs and other things on the floor that can make you trip. What can I do with my stairs? Do not leave any items on the stairs. Make sure that there are handrails on both sides of the stairs and use them.  Fix handrails that are broken or loose. Make sure that handrails are as long as the stairways. Check any carpeting to make sure that it is firmly attached to the stairs. Fix any carpet that is loose or worn. Avoid having throw rugs at the top or bottom of the stairs. If you do have throw rugs, attach them to the floor with carpet tape. Make sure that you have a light switch at the top of the stairs and the bottom of the stairs. If you do not have them, ask someone to add them for you. What else can I do to help prevent falls? Wear shoes that: Do not have high heels. Have rubber bottoms. Are comfortable and fit you well. Are closed at the toe. Do not wear sandals. If you use a stepladder: Make sure that it is  fully opened. Do not climb a closed stepladder. Make sure that both sides of the stepladder are locked into place. Ask someone to hold it for you, if possible. Clearly mark and make sure that you can see: Any grab bars or handrails. First and last steps. Where the edge of each step is. Use tools that help you move around (mobility aids) if they are needed. These include: Canes. Walkers. Scooters. Crutches. Turn on the lights when you go into a dark area. Replace any light bulbs as soon as they burn out. Set up your furniture so you have a clear path. Avoid moving your furniture around. If any of your floors are uneven, fix them. If there are any pets around you, be aware of where they are. Review your medicines with your doctor. Some medicines can make you feel dizzy. This can increase your chance of falling. Ask your doctor what other things that you can do to help prevent falls. This information is not intended to replace advice given to you by your health care provider. Make sure you discuss any questions you have with your health care provider. Document Released: 12/06/2008 Document Revised: 07/18/2015 Document Reviewed: 03/16/2014 Elsevier Interactive Patient Education  2017 [employer].

## 2021-01-01 ENCOUNTER — Encounter: Payer: Self-pay | Admitting: Family Medicine

## 2021-01-02 ENCOUNTER — Telehealth: Payer: Self-pay | Admitting: Family

## 2021-01-02 DIAGNOSIS — F411 Generalized anxiety disorder: Secondary | ICD-10-CM

## 2021-01-02 DIAGNOSIS — F1320 Sedative, hypnotic or anxiolytic dependence, uncomplicated: Secondary | ICD-10-CM

## 2021-01-02 DIAGNOSIS — Z79899 Other long term (current) drug therapy: Secondary | ICD-10-CM

## 2021-01-02 MED ORDER — ALPRAZOLAM 0.5 MG PO TABS: 0.5000 mg | ORAL_TABLET | Freq: Two times a day (BID) | ORAL | 2 refills | Status: DC | PRN

## 2021-01-02 NOTE — Telephone Encounter (Signed)
Prescription sent to pharmacy.

## 2021-01-02 NOTE — Addendum Note (Signed)
Addended by: [name] A on: 01/02/2021 02:30 PM   Modules accepted: Orders

## 2021-01-02 NOTE — Telephone Encounter (Signed)
Pt at appointment told me she already took once a day.

## 2021-01-02 NOTE — Telephone Encounter (Signed)
Daughter states she takes 2 a day. She states Jin tries to take more than that but they only let her take 2.

## 2021-01-13 NOTE — Progress Notes (Signed)
Cardiology Office Note:    Date:  01/14/2021   ID:  Sheila [name], DOB Apr 25, 1940, MRN [id number]  PCP:  Sharion Balloon, FNP  Cardiologist:  Dr. Gwenlyn Found --> pt family wishes to switch providers  Referring MD: Sharion Balloon, FNP   Chief Complaint  Patient presents with   Follow-up    NICM, HFrEF    History of Present Illness:    Sheila [name] is a 80 y.o. female with a hx of GAD on chronic benzodiazepine, chronic back pain, HTN, COPD, hyperparathyroidism, OA, CKD stage III, HLD, MDD, obesity, chronic systolic and diastolic heart failure, NICM with normal coronaries by heart cath 2021.   Echo 09/17/19 with LVEF 20-25% grade 3 DD, moderate MR and TR. Right and left heart cath 10/02/19 with normal coronary arteries. She was admitted and AHF service consulted. She was diuresed using ionotropes and discharged on entresto and spironolactone. Coreg D/C'ed for low cardiac output. On follow up 10/2019, she was somewhat confused. Digoxin level obtained was 1.1 and this was discontinued. She saw Dr. Haroldine Laws in the heart failure clinic who recommended palliative care. She is minimally ambulatory and generally wheelchair-bound with symptoms of chronic heart failure. She last saw Dr. Gwenlyn Found 03/29/20 and was doing a bit better. She had home health and hospice care at that time. She was walking with a walker.   She called our office stating she no longer had help or hospice care coming to the house. She wants to get this reinstated. On presentation today, she presents in a wheelchair. They request palliative care, a bedside commode and something for skin itching. I actually do not think she is extremely volume up. She is taking 49-51 mg entresto, spiro, and 20 mg torsemide daily. She has stopped potassium supplement.    Past Medical History:  Diagnosis Date   Allergy    Bell's palsy    Cancer ([place])    skin CA on face   Cataract    Chest pain 07/2009   Myoveiw stress test negative.   Depression     Diverticulosis of colon    DJD (degenerative joint disease) of knee    Dyspnea    on exertion   Family history of adverse reaction to anesthesia    sister ha nausea/vomiting   Gastric ulcer with hemorrhage 01/14/2011   GERD (gastroesophageal reflux disease)    Headache    History of fractured pelvis    Hypercholesteremia    Hypertension    Hypothyroidism    LV dysfunction    UTI (lower urinary tract infection)     Past Surgical History:  Procedure Laterality Date   APPENDECTOMY     BREAST BIOPSY Left    ESOPHAGOGASTRODUODENOSCOPY  01/14/2011   Procedure: ESOPHAGOGASTRODUODENOSCOPY (EGD);  Surgeon: Rogene Houston, MD;  Location: [facility];  Service: Endoscopy;  Laterality: N/A;   HIP SURGERY     pelvis   JOINT REPLACEMENT     KNEE SURGERY  04/2010.   Total left knee replacement   LAPAROSCOPIC APPENDECTOMY N/A 08/01/2013   Procedure: APPENDECTOMY LAPAROSCOPIC;  Surgeon: Jamesetta So, MD;  Location: [facility];  Service: General;  Laterality: N/A;   RIGHT/LEFT HEART CATH AND CORONARY ANGIOGRAPHY N/A 10/02/2019   Procedure: RIGHT/LEFT HEART CATH AND CORONARY ANGIOGRAPHY;  Surgeon: Lorretta Harp, MD;  Location: [facility];  Service: Cardiovascular;  Laterality: N/A;    Current Medications: Current Meds  Medication Sig   albuterol (PROVENTIL) (2.5 MG/3ML) 0.083% nebulizer  solution NEBULIZE 1 VIAL EVERY 6-8 HOURS AS NEEDED FOR WHEEZING OR SHORTNESS OF BREATH   ALPRAZolam (XANAX) 0.5 MG tablet Take 1 tablet (0.5 mg total) by mouth 2 (two) times daily as needed for anxiety.   Cholecalciferol (VITAMIN D3) 25 MCG (1000 UT) CAPS Take 1,000 Units by mouth daily.    cinacalcet (SENSIPAR) 30 MG tablet Take 1 tablet (30 mg total) by mouth every other day.   diclofenac sodium (VOLTAREN) 1 % GEL Apply 2 g topically 4 (four) times daily.   DULoxetine (CYMBALTA) 60 MG capsule TAKE 1 CAPUSLE ONCE DAILY   ENTRESTO 49-51 MG TAKE 1 TABLET 2 TIMES A DAY   fluticasone (FLONASE) 50 MCG/ACT  nasal spray PLACE 2 SPRAYS INTO BOTH NOSTRILS DAILY   fluticasone furoate-vilanterol (BREO ELLIPTA) 100-25 MCG/ACT AEPB Inhale 1 puff into the lungs daily.   gabapentin (NEURONTIN) 300 MG capsule TAKE 1 CAPSULE 3 TIMES A DAY   Garlic 10 MG CAPS Take by mouth.   hydrOXYzine (VISTARIL) 25 MG capsule Take 1 capsule (25 mg total) by mouth 3 (three) times daily as needed.   levocetirizine (XYZAL) 5 MG tablet TAKE 1 TABLET ONCE DAILY IN THE EVENING   levothyroxine (SYNTHROID) 88 MCG tablet TAKE 1 TABLET DAILY   LIVALO 4 MG TABS TAKE 1 TABLET DAILY   Multiple Vitamin (MULTIVITAMIN WITH MINERALS) TABS tablet Take 1 tablet by mouth daily.   nystatin (MYCOSTATIN/NYSTOP) powder Apply 1 application topically 3 (three) times daily.   Omega-3 Fatty Acids (FISH OIL OMEGA-3 PO) Take 1 capsule by mouth every evening.    pantoprazole (PROTONIX) 20 MG tablet TAKE ONE TABLET AT BEDTIME   spironolactone (ALDACTONE) 25 MG tablet TAKE 1 TABLET ONCE DAILY   SYMBICORT 160-4.5 MCG/ACT inhaler INHALE 2 PUFFS TWICE DAILY   Tetrahydrozoline HCl (VISINE OP) Place 1 drop into both eyes daily as needed (dry eyes/itching).   torsemide (DEMADEX) 20 MG tablet TAKE 2 TABLETS DAILY AS DIRECTED   traMADol (ULTRAM) 50 MG tablet TAKE TWO TABLETS EVERY TWELVE HOURS AS NEEDED FOR PAIN   triamcinolone ointment (KENALOG) 0.5 % Apply 1 application topically 2 (two) times daily.     Allergies:   Ibuprofen, Benadryl [diphenhydramine hcl], Lipitor [atorvastatin], Baclofen, Hydrocodone, Codeine, Morphine and related, and Tdap [tetanus-diphth-acell pertussis]   Social History   Socioeconomic History   Marital status: Widowed    Spouse name: Not on file   Number of children: 2   Years of education: 0   Highest education level: Never attended school  Occupational History   Occupation: retired  Tobacco Use   Smoking status: Former    Types: Cigarettes    Quit date: 02/24/1968    Years since quitting: 52.9   Smokeless tobacco: Current     Types: Snuff   Tobacco comments:    quit yrs and yrs ago when she was very young  Media planner   Vaping Use: Never used  Substance and Sexual Activity   Alcohol use: No   Drug use: No   Sexual activity: Not Currently    Birth control/protection: Post-menopausal  Other Topics Concern   Not on file  Social History Narrative   Lives alone, but has 24 hour caregiver, palliative care   Daughter lives nearby and helps her with paying bills, shopping, etc.   Social Determinants of Health   Financial Resource Strain: Low Risk    Difficulty of Paying Living Expenses: Not very hard  Food Insecurity: No Food Insecurity   Worried About Running  Out of Food in the Last Year: Never true   Ran Out of Food in the Last Year: Never true  Transportation Needs: No Transportation Needs   Lack of Transportation (Medical): No   Lack of Transportation (Non-Medical): No  Physical Activity: Inactive   Days of Exercise per Week: 0 days   Minutes of Exercise per Session: 0 min  Stress: No Stress Concern Present   Feeling of Stress : Only a little  Social Connections: Socially Isolated   Frequency of Communication with Friends and Family: More than three times a week   Frequency of Social Gatherings with Friends and Family: More than three times a week   Attends Religious Services: Never   Marine scientist or Organizations: No   Attends Archivist Meetings: Never   Marital Status: Widowed     Family History: The patient's family history includes Alzheimer's disease in her sister; Aneurysm in her mother; Cancer in her brother, brother, father, and sister; Heart disease in her sister; Hip fracture in her sister; Hypertension in her mother; Stroke in her mother.  ROS:   Please see the history of present illness.     All other systems reviewed and are negative.  EKGs/Labs/Other Studies Reviewed:    The following studies were reviewed today:  R/L heart cath 10/02/19: IMPRESSION: Ms.  Arambula has severe nonischemic cardiomyopathy with incidentally noted ostial/proximal first diagonal branch lesion and a small to medium sized vessel. I think this is true/true and unrelated and probably does not require intervention. Given her severe desaturation and markedly depressed cardiac index we will keep her in the hospital on the heart failure service for guideline directed optimal medical therapy/optimization. She is already on Coreg and would benefit from additional diuretics and initiation of Entresto. The sheaths were removed and a TR band was placed on the right wrist to achieve patent hemostasis. The patient left lab in stable condition. I have communicated her clinical status to Dr. Haroldine Laws who service she will be on.  EKG:  EKG is not ordered today.   Recent Labs: 04/18/2020: Magnesium 2.0 09/23/2020: Hemoglobin 14.0; Platelets 288; TSH 2.480 12/24/2020: ALT 24; BUN 29; Creatinine, Ser 1.29; Potassium 4.5; Sodium 139  Recent Lipid Panel    Component Value Date/Time   CHOL 163 07/26/2019 1454   CHOL 154 07/28/2012 1248   TRIG 200 (H) 07/26/2019 1454   TRIG 142 05/11/2014 1559   TRIG 120 07/28/2012 1248   HDL 43 07/26/2019 1454   HDL 45 05/11/2014 1559   HDL 44 07/28/2012 1248   CHOLHDL 3.8 07/26/2019 1454   LDLCALC 86 07/26/2019 1454   LDLCALC 48 11/15/2012 1255   LDLCALC 86 07/28/2012 1248    Physical Exam:    VS:  BP 131/76   Pulse 96   Ht 4\' 11"  (1.499 m)   Wt 239 lb (108.4 kg)   BMI 48.27 kg/m     Wt Readings from Last 3 Encounters:  01/14/21 239 lb (108.4 kg)  12/30/20 236 lb (107 kg)  12/24/20 236 lb 9.6 oz (107.3 kg)     GEN: obese female in NAD HEENT: Normal NECK: No JVD; No carotid bruits LYMPHATICS: No lymphadenopathy CARDIAC: RRR, no murmurs, rubs, gallops RESPIRATORY:  Clear to auscultation without rales, wheezing or rhonchi  ABDOMEN: Soft, non-tender, non-distended MUSCULOSKELETAL:  No edema; No deformity  SKIN: Warm and dry NEUROLOGIC:  Alert  and oriented x 3 PSYCHIATRIC:  Normal affect   ASSESSMENT:    1. NICM (  nonischemic cardiomyopathy) ([place])   2. Chronic systolic heart failure ([place])   3. Essential hypertension, benign   4. Mixed hyperlipidemia    PLAN:    In order of problems listed above:  NICM Chronic systolic and diastolic heart failure LVEF 20-25%, grade 3 DD Normal coronaries by heart cath Maintained on entresto and diuretic She did not tolerate coreg or digoxin.  She was placed on palliative care  -I will reorder this.  Maintained on 20 mg torsemide. She has had weight gain. I think her breathing problems are related to wheezing. She has improvement in her breathing with inhalers. I think her weight gain over the last 5 months is from calories - eats a lot of ice cream weekly. Fluid status is difficult given obesity.  I advised increase torsemide to 40 mg x 3 days when they get the bedside commode.    Hypertension Medications as above   Hyperlipidemia No further lipid panels. Can continue livalo  Follow up in 6 months.   Medication Adjustments/Labs and Tests Ordered: Current medicines are reviewed at length with the patient today.  Concerns regarding medicines are outlined above.  Orders Placed This Encounter  Procedures   DME Bedside commode   Amb Referral to Palliative Care   No orders of the defined types were placed in this encounter.   Signed, Ledora Bottcher, [place]  01/14/2021 4:22 PM    [facility]

## 2021-01-14 ENCOUNTER — Ambulatory Visit (INDEPENDENT_AMBULATORY_CARE_PROVIDER_SITE_OTHER): Payer: Medicare Other | Admitting: Physician Assistant

## 2021-01-14 ENCOUNTER — Encounter: Payer: Self-pay | Admitting: Physician Assistant

## 2021-01-14 ENCOUNTER — Telehealth: Payer: Self-pay

## 2021-01-14 ENCOUNTER — Other Ambulatory Visit: Payer: Self-pay

## 2021-01-14 VITALS — BP 131/76 | HR 96 | Ht 59.0 in | Wt 239.0 lb

## 2021-01-14 DIAGNOSIS — I10 Essential (primary) hypertension: Secondary | ICD-10-CM

## 2021-01-14 DIAGNOSIS — I5022 Chronic systolic (congestive) heart failure: Secondary | ICD-10-CM | POA: Diagnosis not present

## 2021-01-14 DIAGNOSIS — I428 Other cardiomyopathies: Secondary | ICD-10-CM

## 2021-01-14 DIAGNOSIS — E782 Mixed hyperlipidemia: Secondary | ICD-10-CM | POA: Diagnosis not present

## 2021-01-14 NOTE — Patient Instructions (Addendum)
Medication Instructions:  Buy Zyrtec over the counter medication for Itching  *If you need a refill on your cardiac medications before your next appointment, please call your pharmacy*  Lab Work: NONE ordered at this time of appointment   If you have labs (blood work) drawn today and your tests are completely normal, you will receive your results only by: [place] (if you have MyChart) OR A paper copy in the mail If you have any lab test that is abnormal or we need to change your treatment, we will call you to review the results.  Testing/Procedures: NONE ordered at this time of appointment   Follow-Up: At [facility], you and your health needs are our priority.  As part of our continuing mission to provide you with exceptional heart care, we have created designated Provider Care Teams.  These Care Teams include your primary Cardiologist (physician) and Advanced Practice Providers (APPs -  Physician Assistants and Nurse Practitioners) who all work together to provide you with the care you need, when you need it.  Your next appointment:   6 month(s)  The format for your next appointment:   In Person  Provider:   Quay Burow, MD  or  APP        Other Instructions You have been referred to Palliative Care  Your physician has placed on order for a bedside commode

## 2021-01-14 NOTE — Telephone Encounter (Signed)
Patient saw Fabian Sharp PA-C today and she would like to swtich MD care from Dr. Gwenlyn Found to Dr. Debara Pickett.

## 2021-01-15 NOTE — Telephone Encounter (Signed)
Updated patient's chart to provider change.

## 2021-01-18 ENCOUNTER — Other Ambulatory Visit: Payer: Self-pay | Admitting: Family

## 2021-01-18 ENCOUNTER — Other Ambulatory Visit: Payer: Self-pay | Admitting: Nurse Practitioner

## 2021-01-18 MED ORDER — ALBUTEROL SULFATE (2.5 MG/3ML) 0.083% IN NEBU: INHALATION_SOLUTION | RESPIRATORY_TRACT | 1 refills | Status: DC

## 2021-01-18 NOTE — Progress Notes (Signed)
Received phone call from on call service to refill patients albuterol. RX refill sent to pharmacy

## 2021-01-20 ENCOUNTER — Telehealth: Payer: Self-pay | Admitting: Family

## 2021-01-20 MED ORDER — ALBUTEROL SULFATE (2.5 MG/3ML) 0.083% IN NEBU: INHALATION_SOLUTION | RESPIRATORY_TRACT | 1 refills | Status: DC

## 2021-01-20 NOTE — Telephone Encounter (Signed)
  Prescription Request  01/20/2021  Is this a "Controlled Substance" medicine? no  Have you seen your PCP in the last 2 weeks? no  If YES, route message to pool  -  If NO, patient needs to be scheduled for appointment.  What is the name of the medication or equipment? albuterol (PROVENTIL) (2.5 MG/3ML) 0.083% nebulizer solution  Have you contacted your pharmacy to request a refill? Pharmacy called [place] because they did not receive    Which pharmacy would you like this sent to? [facility]    Patient notified that their request is being sent to the clinical staff for review and that they should receive a response within 2 business days.

## 2021-01-20 NOTE — Telephone Encounter (Signed)
RF sent, one sent on 11/26 was sent to [facility]

## 2021-01-21 NOTE — Telephone Encounter (Signed)
I need to verify what pharmacy patient would like prolia sent to.

## 2021-01-21 NOTE — Telephone Encounter (Signed)
Called patient and she wants to be scheduled for the prolia.  Please call

## 2021-01-23 ENCOUNTER — Other Ambulatory Visit: Payer: Self-pay

## 2021-01-23 ENCOUNTER — Other Ambulatory Visit: Payer: Medicare Other

## 2021-01-23 ENCOUNTER — Other Ambulatory Visit: Payer: Medicare Other | Admitting: *Deleted

## 2021-01-23 VITALS — BP 132/80 | HR 97 | Temp 97.6°F | Resp 17

## 2021-01-23 DIAGNOSIS — Z515 Encounter for palliative care: Secondary | ICD-10-CM

## 2021-01-23 NOTE — Progress Notes (Signed)
COMMUNITY PALLIATIVE CARE SW NOTE  PATIENT NAME: Sheila [name] DOB: November 11, 1940 MRN: [id number]  PRIMARY CARE PROVIDER: Sharion Balloon, FNP  RESPONSIBLE PARTY:  Acct ID - Guarantor Home Phone Work Phone Relationship Acct Type  [id number] Sheila [name], Sheila [name] [phone]  Self P/F     [address], [place], [place] [address]     PLAN OF CARE and INTERVENTIONS:             GOALS OF CARE/ ADVANCE CARE PLANNING: Patient's goal is to be able to get out, and to have decreased anxiety. Patient is a DNR and have a MOST form.  SOCIAL/EMOTIONAL/SPIRITUAL ASSESSMENT/ INTERVENTIONS:  SW and RN- M. Nadara Mustard completed a follow-up visit with patient at her home. She was present with her daughter-Sheila [name], [name] and cousin. Patient was reclined back in her recliner, awake and alert. She reported that she is depressed about not being able to go anywhere other than the doctors. SW validated and normalized her feelings and encouraged the family to consider taking patient for a ride following doctor's appointments or plan an outing. SW also discussed adult day or senior programs that she could attend-she declined due to her anxiety. Patient report that she has shortness of breath when she goes to the bathroom or kitchen. She takes a nebulizer treatment 2x/day along with an inhaler that she feels is effective. Patient reported that she has not been the same since having COVID. She continues to use her walker to ambulate. Her sitter assists her with a bath 3 x's week and she needs assistance with dressing. Patient's sleeping varies, but admits in most cases she has difficulty falling asleep. She is continent of bowel and bladder and wears urinary pads as she does have occasional leakage. Patient's cardiologist ordered a bedside commode for her use. Patient has a history of UTI's. Her sitter and daughter also described that patient has cognitive deficits in the form of memory loss. She also has intermittent experiences of  vertigo. Patient reports having chest pain often. Patient has frequent and intermittent episodes where she has itching due to a rash to her hips, arms and back. Patient has some involuntary shaking in her hands that her daughter is concern about. Her daughter questions if patient needs to see a neurologist. RN to follow-up on interventions to manage patient's anxiety better and if a neurology referral is needed. Patient and family are open to ongoing palliative care visits/support.  PATIENT/CAREGIVER EDUCATION/ COPING: Patient appears to be coping well.  PERSONAL EMERGENCY PLAN:  911 can be accessed for emergencies. COMMUNITY RESOURCES COORDINATION/ HEALTH CARE NAVIGATION:  Patient has a private caregiver 5 days a week for 8 hours.  FINANCIAL/LEGAL CONCERNS/INTERVENTIONS:  None.     SOCIAL HX:  Social History   Tobacco Use   Smoking status: Former    Types: Cigarettes    Quit date: 02/24/1968    Years since quitting: 52.9   Smokeless tobacco: Current    Types: Snuff   Tobacco comments:    quit yrs and yrs ago when she was very young  Substance Use Topics   Alcohol use: No    CODE STATUS: DNR ADVANCED DIRECTIVES: No MOST FORM COMPLETE:  Yes HOSPICE EDUCATION PROVIDED: No  PPS: Patient is alert and oriented x3 with some forgetfulness. Patient needs assistance with personal care needs to include bathing and dressing.   Duration of visit and documentation: 60 minutes.  [address] [place], [place]

## 2021-01-24 ENCOUNTER — Telehealth: Payer: Self-pay | Admitting: *Deleted

## 2021-01-24 DIAGNOSIS — Z515 Encounter for palliative care: Secondary | ICD-10-CM

## 2021-01-24 NOTE — Progress Notes (Signed)
[facility] COMMUNITY PALLIATIVE CARE RN NOTE  PATIENT NAME: Sheila [name] DOB: Feb 07, 1941 MRN: [id number]  PRIMARY CARE PROVIDER: Sharion Balloon, FNP  RESPONSIBLE PARTY: Hilda Blades (daughter) Acct ID - Guarantor Home Phone Work Phone Relationship Acct Type  [id number] STEPAHNIE, CAMPO [phone]  Self P/F     [address], [place], [place] [address]   Covid-19 Pre-screening Negative  PLAN OF CARE and INTERVENTION:  ADVANCE CARE PLANNING/GOALS OF CARE: Goal is for patient to remain in her home. She would like to travel outside of the home more.  PATIENT/CAREGIVER EDUCATION: Symptom management, anxiety management, safe mobility, s/s of infection DISEASE STATUS: Joint follow-up visit made with LCSW, M. Lonon. Met with patient, daughter, family member and hired Actuary in the home. Upon arrival, patient is sitting up in recliner awake and alert. She is able to engage in appropriate conversation but is forgetful at times. She reports pain in her abdomen at times after she eats, but denies pain at this time. She also has occasional chest pain. She is having increased feelings of depression. She says that it is mainly because she is tired of staying in her apartment every day and not getting to travel outside of the home much. She is currently taking Cymbalta 60 mg daily. She is also more short of breath with exertion. She is taking her nebulizer treatments twice daily and her Symbicort inhaler daily. These medications to help her to breathe better. She says she was positive for Covid a few months ago. She is experiencing increased anxiety. She is currently taking Xanax 0.5 mg 1 tablet twice daily as needed. Patient and family feels that she could benefit from being able to take an additional tablet, totaling 3/day to be able to give mid-day. Advised will speak with our palliative care physician in the morning, as she is out of the office today. She is ambulatory using her walker. She does require assistance with  bathing and dressing. She is able to take herself to the bathroom. She has been having issues with having to strain to urinate. Her diuretic was increased and this has helped some. Per family, she was treated with three different antibiotics a few months to clear her UTI. Occasional urinary incontinence and she wears sanitary pads. Last bowel movement was yesterday. Her appetite is good. She feels that she has gained too much weight and is working on decreasing her intake. They also had a concern that patient has started with bilateral hand tremors. They report Parkinson's disease runs in their family. I advised that she would need to be referred to a Neurologist if they want to pursue this. They verbalized understanding. She has vertigo at times and has Meclizine for this. She also has issues with itching. She was recently placed on Prednisone and Hydroxyzine. Once her prednisone was complete she started with itching again. It is mainly under her arm, hip and back. She does have a steroid cream that helps. Will continue to monitor.  HISTORY OF PRESENT ILLNESS:  This is a 80 yo female with a diagnosis of COPD. She has a history of hypertension, nonischemic cardiomyopathy, hyperlipidemia, peptic ulcer disease, hypothyroidism, neuropathy, chronic kidney disease Stage 3 and depression. Palliative care team continues to follow patient for additional support, goals of care and complex decision making.   CODE STATUS: DNR ADVANCED DIRECTIVES: Y MOST FORM: yes PPS: 50%   PHYSICAL EXAM:   VITALS: Today's Vitals   01/24/21 1353  BP: 132/80  Pulse: 97  Resp:  17  Temp: 97.6 F (36.4 C)  TempSrc: Temporal  SpO2: 98%  PainSc: 0-No pain     (Duration of visit and documentation 75 minutes)   Daryl Eastern, RN BSN

## 2021-01-24 NOTE — Telephone Encounter (Signed)
9:05a Spoke with Dr. Mariea Clonts regarding patient's increasing anxiety. She is currently taking Xanax 0.5 mg 1 tablet twice daily for anxiety. Patient and family feels that patient needs to have 1 more tablet available that she can take during the day to help calm her down. Dr. Mariea Clonts agreed to adding an additional Xanax, so is now 1 tablet 3 times daily as needed for anxiety. Family also spoke about patient depression. She is currently taking Cymbalta 60 mg daily. Dr. Mariea Clonts says that usually increasing this medication's dose does not seem to produce any additional results. She feels this medication should be addressed by her PCP.  2:15p Called patient's residence and spoke with hired caregiver, who was present on yesterday's visit to advise of above noted information. She says that patient will need more tablets. Advised that I will let Dr. Mariea Clonts know. She verbalized understanding and is appreciative.   3:08p Spoke with Dr. Mariea Clonts and she has faxed new prescription to patient's pharmacy. ([facility]).

## 2021-01-29 ENCOUNTER — Other Ambulatory Visit: Payer: Self-pay | Admitting: Nurse Practitioner

## 2021-02-05 ENCOUNTER — Telehealth: Payer: Self-pay | Admitting: Family Medicine

## 2021-02-05 MED ORDER — DENOSUMAB 60 MG/ML ~~LOC~~ SOSY: 60.0000 mg | PREFILLED_SYRINGE | Freq: Once | SUBCUTANEOUS | 0 refills | Status: AC

## 2021-02-05 NOTE — Telephone Encounter (Signed)
Patient aware that Rx sent to [facility]. Pt will call once she is able to pick up prolia to schedule an appointment to administer.

## 2021-02-05 NOTE — Telephone Encounter (Signed)
Sent to plan across 12/14

## 2021-02-05 NOTE — Telephone Encounter (Signed)
PA sent over to plan today on Prolia.

## 2021-02-07 NOTE — Telephone Encounter (Signed)
Per [facility] pts plan does not require a PA. It is on pt's list of covered drugs.  [place] made aware.

## 2021-02-18 ENCOUNTER — Ambulatory Visit (INDEPENDENT_AMBULATORY_CARE_PROVIDER_SITE_OTHER): Payer: Medicare Other | Admitting: Family Medicine

## 2021-02-18 ENCOUNTER — Encounter: Payer: Self-pay | Admitting: Family Medicine

## 2021-02-18 VITALS — BP 132/62 | HR 110 | Temp 98.0°F | Ht 59.0 in | Wt 236.0 lb

## 2021-02-18 DIAGNOSIS — J441 Chronic obstructive pulmonary disease with (acute) exacerbation: Secondary | ICD-10-CM

## 2021-02-18 DIAGNOSIS — J069 Acute upper respiratory infection, unspecified: Secondary | ICD-10-CM

## 2021-02-18 LAB — VERITOR FLU A/B WAIVED
Influenza A: NEGATIVE
Influenza B: NEGATIVE

## 2021-02-18 MED ORDER — GUAIFENESIN ER 600 MG PO TB12: 600.0000 mg | ORAL_TABLET | Freq: Two times a day (BID) | ORAL | 0 refills | Status: AC

## 2021-02-18 MED ORDER — DOXYCYCLINE HYCLATE 100 MG PO TABS: 100.0000 mg | ORAL_TABLET | Freq: Two times a day (BID) | ORAL | 0 refills | Status: AC

## 2021-02-18 MED ORDER — GUAIFENESIN ER 600 MG PO TB12: 600.0000 mg | ORAL_TABLET | Freq: Two times a day (BID) | ORAL | 0 refills | Status: DC

## 2021-02-18 MED ORDER — DOXYCYCLINE HYCLATE 100 MG PO TABS: 100.0000 mg | ORAL_TABLET | Freq: Two times a day (BID) | ORAL | 0 refills | Status: DC

## 2021-02-18 MED ORDER — PREDNISONE 20 MG PO TABS
40.0000 mg | ORAL_TABLET | Freq: Every day | ORAL | 0 refills | Status: AC
Start: 2021-02-18 — End: 2021-02-23

## 2021-02-18 MED ORDER — PREDNISONE 20 MG PO TABS: 40.0000 mg | ORAL_TABLET | Freq: Every day | ORAL | 0 refills | Status: DC

## 2021-02-18 NOTE — Progress Notes (Signed)
Subjective:  Patient ID: Sheila [name], female    DOB: December 28, 1940, 80 y.o.   MRN: [id number]  Patient Care Team: Sharion Balloon, FNP as PCP - General (Family Medicine) Debara Pickett Nadean Corwin, MD as PCP - Cardiology (Cardiology) Rogene Houston, MD as Consulting Physician (Gastroenterology) Burnell Blanks, MD as Consulting Physician (Cardiology) Ilean China, RN as Registered Nurse Shea Evans, Norva Riffle, LCSW as Social Worker (Licensed Clinical Social Worker)   Chief Complaint:  Cough (Congestion, eyes red and irritated)   HPI: Sheila [name] is a 80 y.o. female presenting on 02/18/2021 for Cough (Congestion, eyes red and irritated)   Patient presents today with complaints of increasing cough and congestion over the last several days.  States she has been more short of breath with exertion and is producing more sputum.  She does use her nebulizer treatments twice daily for COPD with some improvement in symptoms but not resolution.  She denies any fever, chills, increased weakness, or confusion.  She states that her legs have been slightly swollen but nothing more than normal.  She reports that yesterday when she woke up her eyes were irritated and matted but this has since resolved after using cough.  She denies any chest pain, palpitations, orthopnea, PND, dizziness, or syncope.  She denies any known sick exposures.  Cough This is a recurrent problem. The current episode started more than 1 year ago. The problem has been gradually worsening. The problem occurs constantly. The cough is Productive of sputum. Associated symptoms include eye redness, nasal congestion, shortness of breath and wheezing. Pertinent negatives include no chest pain, chills, ear congestion, ear pain, fever, headaches, heartburn, hemoptysis, myalgias, postnasal drip, rash, rhinorrhea, sore throat, sweats or weight loss. She has tried a beta-agonist inhaler, OTC cough suppressant, rest and steroid inhaler for the  symptoms. The treatment provided mild relief. Her past medical history is significant for COPD.    Relevant past medical, surgical, family, and social history reviewed and updated as indicated.  Allergies and medications reviewed and updated. Data reviewed: Chart in Epic.   Past Medical History:  Diagnosis Date   Allergy    Bell's palsy    Cancer ([place])    skin CA on face   Cataract    Chest pain 07/2009   Myoveiw stress test negative.   Depression    Diverticulosis of colon    DJD (degenerative joint disease) of knee    Dyspnea    on exertion   Family history of adverse reaction to anesthesia    sister ha nausea/vomiting   Gastric ulcer with hemorrhage 01/14/2011   GERD (gastroesophageal reflux disease)    Headache    History of fractured pelvis    Hypercholesteremia    Hypertension    Hypothyroidism    LV dysfunction    UTI (lower urinary tract infection)     Past Surgical History:  Procedure Laterality Date   APPENDECTOMY     BREAST BIOPSY Left    ESOPHAGOGASTRODUODENOSCOPY  01/14/2011   Procedure: ESOPHAGOGASTRODUODENOSCOPY (EGD);  Surgeon: Rogene Houston, MD;  Location: [facility];  Service: Endoscopy;  Laterality: N/A;   HIP SURGERY     pelvis   JOINT REPLACEMENT     KNEE SURGERY  04/2010.   Total left knee replacement   LAPAROSCOPIC APPENDECTOMY N/A 08/01/2013   Procedure: APPENDECTOMY LAPAROSCOPIC;  Surgeon: Jamesetta So, MD;  Location: [facility];  Service: General;  Laterality: N/A;   RIGHT/LEFT HEART CATH  AND CORONARY ANGIOGRAPHY N/A 10/02/2019   Procedure: RIGHT/LEFT HEART CATH AND CORONARY ANGIOGRAPHY;  Surgeon: Lorretta Harp, MD;  Location: [facility];  Service: Cardiovascular;  Laterality: N/A;    Social History   Socioeconomic History   Marital status: Widowed    Spouse name: Not on file   Number of children: 2   Years of education: 0   Highest education level: Never attended school  Occupational History   Occupation: retired   Tobacco Use   Smoking status: Former    Types: Cigarettes    Quit date: 02/24/1968    Years since quitting: 53.0   Smokeless tobacco: Current    Types: Snuff   Tobacco comments:    quit yrs and yrs ago when she was very young  Media planner   Vaping Use: Never used  Substance and Sexual Activity   Alcohol use: No   Drug use: No   Sexual activity: Not Currently    Birth control/protection: Post-menopausal  Other Topics Concern   Not on file  Social History Narrative   Lives alone, but has 24 hour caregiver, palliative care   Daughter lives nearby and helps her with paying bills, shopping, etc.   Social Determinants of Health   Financial Resource Strain: Low Risk    Difficulty of Paying Living Expenses: Not very hard  Food Insecurity: No Food Insecurity   Worried About [name] in the Last Year: Never true   Honor in the Last Year: Never true  Transportation Needs: No Transportation Needs   Lack of Transportation (Medical): No   Lack of Transportation (Non-Medical): No  Physical Activity: Inactive   Days of Exercise per Week: 0 days   Minutes of Exercise per Session: 0 min  Stress: No Stress Concern Present   Feeling of Stress : Only a little  Social Connections: Socially Isolated   Frequency of Communication with Friends and Family: More than three times a week   Frequency of Social Gatherings with Friends and Family: More than three times a week   Attends Religious Services: Never   Marine scientist or Organizations: No   Attends Archivist Meetings: Never   Marital Status: Widowed  Intimate Partner Violence: Not At Risk   Fear of Current or Ex-Partner: No   Emotionally Abused: No   Physically Abused: No   Sexually Abused: No    Outpatient Encounter Medications as of 02/18/2021  Medication Sig   albuterol (PROVENTIL) (2.5 MG/3ML) 0.083% nebulizer solution NEBULIZE 1 VIAL EVERY 6-8 HOURS AS NEEDED FOR WHEEZING OR SHORTNESS OF  BREATH   ALPRAZolam (XANAX) 0.5 MG tablet Take 1 tablet (0.5 mg total) by mouth 2 (two) times daily as needed for anxiety.   Cholecalciferol (VITAMIN D3) 25 MCG (1000 UT) CAPS Take 1,000 Units by mouth daily.    cinacalcet (SENSIPAR) 30 MG tablet Take 1 tablet (30 mg total) by mouth every other day.   diclofenac sodium (VOLTAREN) 1 % GEL Apply 2 g topically 4 (four) times daily.   doxycycline (VIBRA-TABS) 100 MG tablet Take 1 tablet (100 mg total) by mouth 2 (two) times daily for 10 days. 1 po bid   DULoxetine (CYMBALTA) 60 MG capsule TAKE 1 CAPUSLE ONCE DAILY   ENTRESTO 49-51 MG TAKE 1 TABLET 2 TIMES A DAY   fluticasone (FLONASE) 50 MCG/ACT nasal spray PLACE 2 SPRAYS INTO BOTH NOSTRILS DAILY   fluticasone furoate-vilanterol (BREO ELLIPTA) 100-25 MCG/ACT AEPB Inhale 1  puff into the lungs daily.   gabapentin (NEURONTIN) 300 MG capsule TAKE 1 CAPSULE 3 TIMES A DAY   Garlic 10 MG CAPS Take by mouth.   guaiFENesin (MUCINEX) 600 MG 12 hr tablet Take 1 tablet (600 mg total) by mouth 2 (two) times daily for 10 days.   hydrOXYzine (VISTARIL) 25 MG capsule Take 1 capsule (25 mg total) by mouth 3 (three) times daily as needed.   levocetirizine (XYZAL) 5 MG tablet TAKE 1 TABLET ONCE DAILY IN THE EVENING   levothyroxine (SYNTHROID) 88 MCG tablet TAKE 1 TABLET DAILY   LIVALO 4 MG TABS TAKE 1 TABLET DAILY   Multiple Vitamin (MULTIVITAMIN WITH MINERALS) TABS tablet Take 1 tablet by mouth daily.   nystatin (MYCOSTATIN/NYSTOP) powder Apply 1 application topically 3 (three) times daily.   Omega-3 Fatty Acids (FISH OIL OMEGA-3 PO) Take 1 capsule by mouth every evening.    pantoprazole (PROTONIX) 20 MG tablet TAKE ONE TABLET AT BEDTIME   predniSONE (DELTASONE) 20 MG tablet Take 2 tablets (40 mg total) by mouth daily with breakfast for 5 days.   spironolactone (ALDACTONE) 25 MG tablet TAKE 1 TABLET ONCE DAILY   SYMBICORT 160-4.5 MCG/ACT inhaler INHALE 2 PUFFS TWICE DAILY   Tetrahydrozoline HCl (VISINE OP) Place 1  drop into both eyes daily as needed (dry eyes/itching).   torsemide (DEMADEX) 20 MG tablet TAKE 2 TABLETS DAILY AS DIRECTED   traMADol (ULTRAM) 50 MG tablet TAKE TWO TABLETS EVERY TWELVE HOURS AS NEEDED FOR PAIN   triamcinolone ointment (KENALOG) 0.5 % Apply 1 application topically 2 (two) times daily.   No facility-administered encounter medications on file as of 02/18/2021.    Allergies  Allergen Reactions   Ibuprofen Other (See Comments)    History of bleeding ulcers - contra-indicated    Benadryl [Diphenhydramine Hcl] Other (See Comments)    Worsening depression    Lipitor [Atorvastatin] Other (See Comments)    Body aches.   Baclofen Other (See Comments)    Loopy    Hydrocodone Nausea And Vomiting   Codeine Nausea And Vomiting   Morphine And Related Nausea And Vomiting   Tdap [Tetanus-Diphth-Acell Pertussis] Swelling    Redness and localized swelling at injection site     Review of Systems  Constitutional:  Negative for activity change, appetite change, chills, diaphoresis, fatigue, fever, unexpected weight change and weight loss.  HENT:  Positive for congestion. Negative for ear pain, postnasal drip, rhinorrhea and sore throat.   Eyes:  Positive for discharge and redness. Negative for photophobia, pain, itching and visual disturbance.  Respiratory:  Positive for cough, shortness of breath and wheezing. Negative for hemoptysis.   Cardiovascular:  Positive for leg swelling. Negative for chest pain and palpitations.  Gastrointestinal:  Negative for abdominal pain, constipation, diarrhea, heartburn, nausea and vomiting.  Genitourinary:  Negative for decreased urine volume and difficulty urinating.  Musculoskeletal:  Negative for myalgias.  Skin:  Negative for rash.  Neurological:  Negative for dizziness, tremors, seizures, syncope, facial asymmetry, speech difficulty, light-headedness, numbness and headaches.  Psychiatric/Behavioral:  Negative for confusion.   All other  systems reviewed and are negative.      Objective:  BP 132/62    Pulse (!) 110    Temp 98 F (36.7 C)    Ht $R'4\' 11"'Tp$  (1.499 m)    Wt 236 lb (107 kg)    SpO2 92%    BMI 47.67 kg/m    Wt Readings from Last 3 Encounters:  02/18/21 236 lb (107  kg)  01/14/21 239 lb (108.4 kg)  12/30/20 236 lb (107 kg)    Physical Exam Vitals and nursing note reviewed.  Constitutional:      General: She is not in acute distress.    Appearance: Normal appearance. She is obese. She is ill-appearing (chronically ill). She is not toxic-appearing or diaphoretic.  HENT:     Head: Normocephalic and atraumatic.     Right Ear: Tympanic membrane, ear canal and external ear normal.     Left Ear: Tympanic membrane, ear canal and external ear normal.     Mouth/Throat:     Mouth: Mucous membranes are moist.     Pharynx: Oropharynx is clear.  Eyes:     General: No scleral icterus.       Right eye: No discharge.        Left eye: No discharge.     Conjunctiva/sclera: Conjunctivae normal.     Pupils: Pupils are equal, round, and reactive to light.  Cardiovascular:     Rate and Rhythm: Normal rate and regular rhythm.     Heart sounds: Normal heart sounds. No murmur heard.   No friction rub. No gallop.  Pulmonary:     Effort: Pulmonary effort is normal. No respiratory distress.     Breath sounds: No stridor. Wheezing and rhonchi present. No rales.  Chest:     Chest wall: No tenderness.  Musculoskeletal:     Right lower leg: 1+ Edema present.     Left lower leg: 1+ Edema present.  Skin:    General: Skin is warm and dry.     Capillary Refill: Capillary refill takes less than 2 seconds.  Neurological:     General: No focal deficit present.     Mental Status: She is alert and oriented to person, place, and time.     Motor: Weakness (generalized) present.     Gait: Gait abnormal (using walker).  Psychiatric:        Mood and Affect: Mood normal.        Behavior: Behavior normal.        Thought Content: Thought  content normal.        Judgment: Judgment normal.    Results for orders placed or performed in visit on 12/24/20  CMP14+EGFR  Result Value Ref Range   Glucose 138 (H) 70 - 99 mg/dL   BUN 29 (H) 8 - 27 mg/dL   Creatinine, Ser 1.29 (H) 0.57 - 1.00 mg/dL   eGFR 42 (L) >59 mL/min/1.73   BUN/Creatinine Ratio 22 12 - 28   Sodium 139 134 - 144 mmol/L   Potassium 4.5 3.5 - 5.2 mmol/L   Chloride 98 96 - 106 mmol/L   CO2 20 20 - 29 mmol/L   Calcium 10.2 8.7 - 10.3 mg/dL   Total Protein 7.2 6.0 - 8.5 g/dL   Albumin 4.6 3.7 - 4.7 g/dL   Globulin, Total 2.6 1.5 - 4.5 g/dL   Albumin/Globulin Ratio 1.8 1.2 - 2.2   Bilirubin Total 0.5 0.0 - 1.2 mg/dL   Alkaline Phosphatase 64 44 - 121 IU/L   AST 27 0 - 40 IU/L   ALT 24 0 - 32 IU/L       Pertinent labs & imaging results that were available during my care of the patient were reviewed by me and considered in my medical decision making.  Assessment & Plan:  Sheila [name] was seen today for cough.  Diagnoses and all orders for this visit:  URI with  cough and congestion Symptoms consistent with COPD exacerbation but due to significant underlying comorbidities and age, will test for influenza and COVID and treat if warranted.  -     Veritor Flu A/B Waived -     Novel Coronavirus, NAA ([facility])  COPD with exacerbation (HCC) Increased cough, shortness of breath and sputum production. Will treat acute exacerbation with below. Pt and daughter aware of red flags which require emergent evaluation and treatment. Follow up in 2 weeks for reevaluation.  -     predniSONE (DELTASONE) 20 MG tablet; Take 2 tablets (40 mg total) by mouth daily with breakfast for 5 days. -     doxycycline (VIBRA-TABS) 100 MG tablet; Take 1 tablet (100 mg total) by mouth 2 (two) times daily for 10 days. 1 po bid -     guaiFENesin (MUCINEX) 600 MG 12 hr tablet; Take 1 tablet (600 mg total) by mouth 2 (two) times daily for 10 days.     Continue all other maintenance  medications.  Follow up plan: Return in about 2 weeks (around 03/04/2021), or if symptoms worsen or fail to improve, for COPD exacerbation .   Continue healthy lifestyle choices, including diet (rich in fruits, vegetables, and lean proteins, and low in salt and simple carbohydrates) and exercise (at least 30 minutes of moderate physical activity daily).  Educational handout given for COPD exacerbation.   The above assessment and management plan was discussed with the patient. The patient verbalized understanding of and has agreed to the management plan. Patient is aware to call the clinic if they develop any new symptoms or if symptoms persist or worsen. Patient is aware when to return to the clinic for a follow-up visit. Patient educated on when it is appropriate to go to the emergency department.   Monia Pouch, FNP-C [facility] [phone]

## 2021-02-19 LAB — NOVEL CORONAVIRUS, NAA: SARS-CoV-2, NAA: NOT DETECTED

## 2021-02-19 LAB — SARS-COV-2, NAA 2 DAY TAT

## 2021-02-24 ENCOUNTER — Other Ambulatory Visit: Payer: Self-pay | Admitting: Family

## 2021-02-24 DIAGNOSIS — M549 Dorsalgia, unspecified: Secondary | ICD-10-CM

## 2021-02-24 DIAGNOSIS — G8929 Other chronic pain: Secondary | ICD-10-CM

## 2021-02-24 DIAGNOSIS — K219 Gastro-esophageal reflux disease without esophagitis: Secondary | ICD-10-CM

## 2021-03-03 ENCOUNTER — Other Ambulatory Visit: Payer: Self-pay | Admitting: Family

## 2021-03-03 DIAGNOSIS — L282 Other prurigo: Secondary | ICD-10-CM

## 2021-03-04 ENCOUNTER — Ambulatory Visit (INDEPENDENT_AMBULATORY_CARE_PROVIDER_SITE_OTHER): Payer: Commercial Managed Care - HMO | Admitting: Family

## 2021-03-04 ENCOUNTER — Encounter: Payer: Self-pay | Admitting: Family

## 2021-03-04 ENCOUNTER — Telehealth: Payer: Self-pay | Admitting: Family

## 2021-03-04 VITALS — BP 122/80 | HR 133 | Temp 97.2°F | Ht 59.0 in | Wt 233.0 lb

## 2021-03-04 DIAGNOSIS — R9431 Abnormal electrocardiogram [ECG] [EKG]: Secondary | ICD-10-CM | POA: Diagnosis not present

## 2021-03-04 DIAGNOSIS — J441 Chronic obstructive pulmonary disease with (acute) exacerbation: Secondary | ICD-10-CM | POA: Diagnosis not present

## 2021-03-04 DIAGNOSIS — G8929 Other chronic pain: Secondary | ICD-10-CM

## 2021-03-04 DIAGNOSIS — R079 Chest pain, unspecified: Secondary | ICD-10-CM | POA: Diagnosis not present

## 2021-03-04 DIAGNOSIS — F411 Generalized anxiety disorder: Secondary | ICD-10-CM | POA: Diagnosis not present

## 2021-03-04 MED ORDER — TRAMADOL HCL 50 MG PO TABS: ORAL_TABLET | ORAL | 2 refills | Status: DC

## 2021-03-04 NOTE — Progress Notes (Signed)
Subjective:    Patient ID: Sheila [name], female    DOB: Apr 09, 1940, 82 y.o.   MRN: [id number]  Chief Complaint  Patient presents with   COPD    2 week follow up    PT presents to the office today for follow up on COPD exacerbation. She was seen on 02/18/21 and given doxycycline, prednisone, and guaifenesin. She states her breathing is better since starting the medications.   However, she started having chest pain this morning. She states sitting the pain resolves. She took a "nerve pill" and it resolved.  COPD She complains of cough, frequent throat clearing, shortness of breath and wheezing. This is a chronic problem. The current episode started more than 1 year ago. Associated symptoms include chest pain and malaise/fatigue. Her past medical history is significant for COPD.  Chest Pain  This is a new problem. The current episode started today. The onset quality is sudden. The problem has been waxing and waning. The pain is at a severity of 10/10. The pain is moderate. The quality of the pain is described as stabbing. The pain does not radiate. Associated symptoms include a cough, exertional chest pressure, malaise/fatigue, palpitations and shortness of breath. She has tried rest and analgesics (xanax) for the symptoms. The treatment provided moderate relief.     Review of Systems  Constitutional:  Positive for malaise/fatigue.  Respiratory:  Positive for cough, shortness of breath and wheezing.   Cardiovascular:  Positive for chest pain and palpitations.  All other systems reviewed and are negative.     Objective:   Physical Exam Vitals reviewed.  Constitutional:      General: She is not in acute distress.    Appearance: She is well-developed. She is obese.  HENT:     Head: Normocephalic and atraumatic.     Right Ear: Tympanic membrane normal.     Left Ear: Tympanic membrane normal.  Eyes:     Pupils: Pupils are equal, round, and reactive to light.  Neck:     Thyroid: No  thyromegaly.  Cardiovascular:     Rate and Rhythm: Regular rhythm. Tachycardia present.     Heart sounds: Normal heart sounds. No murmur heard. Pulmonary:     Effort: Pulmonary effort is normal. No respiratory distress.     Breath sounds: Normal breath sounds. No wheezing.  Abdominal:     General: Bowel sounds are normal. There is no distension.     Palpations: Abdomen is soft.     Tenderness: There is no abdominal tenderness.  Musculoskeletal:        General: No tenderness. Normal range of motion.     Cervical back: Normal range of motion and neck supple.  Skin:    General: Skin is warm and dry.  Neurological:     Mental Status: She is alert and oriented to person, place, and time.     Cranial Nerves: No cranial nerve deficit.     Deep Tendon Reflexes: Reflexes are normal and symmetric.  Psychiatric:        Behavior: Behavior normal.        Thought Content: Thought content normal.        Judgment: Judgment normal.      BP 122/80    Pulse (!) 133    Temp (!) 97.2 F (36.2 C) (Temporal)    Ht 4\' 11"  (1.499 m)    Wt 233 lb (105.7 kg)    BMI 47.06 kg/m      Assessment &  Plan:  Sheila [name] comes in today with chief complaint of COPD (2 week follow up )   Diagnosis and orders addressed:  1. Chest pain, unspecified type - EKG 12-Lead  2. Chronic obstructive pulmonary disease with acute exacerbation (Sheila [name])  3. GAD (generalized anxiety disorder)  4. Morbid obesity (Sheila [name])  5. Abnormal EKG   Given chest pain and co-morbilities I recommend her to go to ED. Pt refuses at this time and wants to go home and rest in her recliner. I have discussed in length the risks and she is aware.  If chest pain worsen  or continues to go to ED.   [name], FNP

## 2021-03-04 NOTE — Patient Instructions (Signed)
Nonspecific Chest Pain, Adult °Chest pain is an uncomfortable, tight, or painful feeling in the chest. The pain can feel like a crushing, aching, or squeezing pressure. A person can feel a burning or tingling sensation. Chest pain can also be felt in your back, neck, jaw, shoulder, or arm. This pain can be worse when you move, sneeze, or take a deep breath. °Chest pain can be caused by a condition that is life-threatening. This must be treated right away. It can also be caused by something that is not life-threatening. If you have chest pain, it can be hard to know the difference, so it is important to get help right away to make sure that you do not have a serious condition. °Some life-threatening causes of chest pain include: °Heart attack. °A tear in the body's main blood vessel (aortic dissection). °Inflammation around your heart (pericarditis). °A problem in the lungs, such as a blood clot (pulmonary embolism) or a collapsed lung (pneumothorax). °Some non life-threatening causes of chest pain include: °Heartburn. °Anxiety or stress. °Damage to the bones, muscles, and cartilage that make up your chest wall. °Pneumonia or bronchitis. °Shingles infection (varicella-zoster virus). °Your chest pain may come and go. It may also be constant. Your health care provider will do tests and other studies to find the cause of your pain. Treatment will depend on the cause of your chest pain. °Follow these instructions at home: °Medicines °Take over-the-counter and prescription medicines only as told by your health care provider. °If you were prescribed an antibiotic medicine, take it as told by your health care provider. Do not stop taking the antibiotic even if you start to feel better. °Activity °Avoid any activities that cause chest pain. °Do not lift anything that is heavier than 10 lb (4.5 kg), or the limit that you are told, until your health care provider says that it is safe. °Rest as directed by your health care  provider. °Return to your normal activities only as told by your health care provider. Ask your health care provider what activities are safe for you. °Lifestyle °  °Do not use any products that contain nicotine or tobacco, such as cigarettes, e-cigarettes, and chewing tobacco. If you need help quitting, ask your health care provider. °Do not drink alcohol. °Make healthy lifestyle changes as recommended. These may include: °Getting regular exercise. Ask your health care provider to suggest some exercises that are safe for you. °Eating a heart-healthy diet. This includes plenty of fresh fruits and vegetables, whole grains, low-fat (lean) protein, and low-fat dairy products. A dietitian can help you find healthy eating options. °Maintaining a healthy weight. °Managing any other health conditions you may have, such as high blood pressure (hypertension) or diabetes. °Reducing stress, such as with yoga or relaxation techniques. °General instructions °Pay attention to any changes in your symptoms. °It is up to you to get the results of any tests that were done. Ask your health care provider, or the department that is doing the tests, when your results will be ready. °Keep all follow-up visits as told by your health care provider. This is important. °You may be asked to go for further testing if your chest pain does not go away. °Contact a health care provider if: °Your chest pain does not go away. °You feel depressed. °You have a fever. °You notice changes in your symptoms or develop new symptoms. °Get help right away if: °Your chest pain gets worse. °You have a cough that gets worse, or you cough up   blood. °You have severe pain in your abdomen. °You faint. °You have sudden, unexplained chest discomfort. °You have sudden, unexplained discomfort in your arms, back, neck, or jaw. °You have shortness of breath at any time. °You suddenly start to sweat, or your skin gets clammy. °You feel nausea or you vomit. °You suddenly  feel lightheaded or dizzy. °You have severe weakness, or unexplained weakness or fatigue. °Your heart begins to beat quickly, or it feels like it is skipping beats. °These symptoms may represent a serious problem that is an emergency. Do not wait to see if the symptoms will go away. Get medical help right away. Call your local emergency services (911 in the [place] Do not drive yourself to the hospital. °Summary °Chest pain can be caused by a condition that is serious and requires urgent treatment. It may also be caused by something that is not life-threatening. °Your health care provider may do lab tests and other studies to find the cause of your pain. °Follow your health care provider's instructions on taking medicines, making lifestyle changes, and getting emergency treatment if symptoms become worse. °Keep all follow-up visits as told by your health care provider. This includes visits for any further testing if your chest pain does not go away. °This information is not intended to replace advice given to you by your health care provider. Make sure you discuss any questions you have with your health care provider. °Document Revised: 04/25/2020 Document Reviewed: 04/25/2020 °Elsevier Patient Education © 2022 [employer] ° °

## 2021-03-04 NOTE — Telephone Encounter (Signed)
Patient aware and verbalized understanding. °

## 2021-03-04 NOTE — Telephone Encounter (Signed)
Prescription sent to pharmacy.

## 2021-03-04 NOTE — Telephone Encounter (Signed)
According to daughter, pt only has one traMADol (ULTRAM) 50 MG tablet left and needs to have more called in. Please call back and advise.

## 2021-03-05 ENCOUNTER — Telehealth: Payer: Self-pay | Admitting: Family

## 2021-03-05 DIAGNOSIS — G8929 Other chronic pain: Secondary | ICD-10-CM

## 2021-03-05 MED ORDER — TRAMADOL HCL 50 MG PO TABS: ORAL_TABLET | ORAL | 2 refills | Status: DC

## 2021-03-05 NOTE — Telephone Encounter (Signed)
Will close encounter

## 2021-03-05 NOTE — Telephone Encounter (Signed)
Prescription sent to pharmacy.

## 2021-03-06 ENCOUNTER — Emergency Department (HOSPITAL_COMMUNITY): Payer: Medicare Other

## 2021-03-06 ENCOUNTER — Inpatient Hospital Stay (HOSPITAL_COMMUNITY)
Admission: EM | Admit: 2021-03-06 | Discharge: 2021-03-09 | DRG: 690 | Disposition: A | Discharge status: Home / Self Care | Payer: Medicare Other | Attending: Internal Medicine | Admitting: Internal Medicine

## 2021-03-06 ENCOUNTER — Encounter (HOSPITAL_COMMUNITY): Payer: Self-pay | Admitting: *Deleted

## 2021-03-06 DIAGNOSIS — Z79890 Hormone replacement therapy (postmenopausal): Secondary | ICD-10-CM

## 2021-03-06 DIAGNOSIS — Z7951 Long term (current) use of inhaled steroids: Secondary | ICD-10-CM

## 2021-03-06 DIAGNOSIS — R0609 Other forms of dyspnea: Secondary | ICD-10-CM | POA: Diagnosis not present

## 2021-03-06 DIAGNOSIS — Z823 Family history of stroke: Secondary | ICD-10-CM

## 2021-03-06 DIAGNOSIS — Z79899 Other long term (current) drug therapy: Secondary | ICD-10-CM | POA: Diagnosis not present

## 2021-03-06 DIAGNOSIS — R6510 Systemic inflammatory response syndrome (SIRS) of non-infectious origin without acute organ dysfunction: Secondary | ICD-10-CM | POA: Diagnosis present

## 2021-03-06 DIAGNOSIS — I700 Atherosclerosis of aorta: Secondary | ICD-10-CM | POA: Diagnosis not present

## 2021-03-06 DIAGNOSIS — F32A Depression, unspecified: Secondary | ICD-10-CM | POA: Diagnosis present

## 2021-03-06 DIAGNOSIS — K219 Gastro-esophageal reflux disease without esophagitis: Secondary | ICD-10-CM | POA: Diagnosis present

## 2021-03-06 DIAGNOSIS — N179 Acute kidney failure, unspecified: Secondary | ICD-10-CM | POA: Diagnosis not present

## 2021-03-06 DIAGNOSIS — I517 Cardiomegaly: Secondary | ICD-10-CM | POA: Diagnosis not present

## 2021-03-06 DIAGNOSIS — I5042 Chronic combined systolic (congestive) and diastolic (congestive) heart failure: Secondary | ICD-10-CM | POA: Diagnosis present

## 2021-03-06 DIAGNOSIS — E872 Acidosis, unspecified: Secondary | ICD-10-CM | POA: Diagnosis present

## 2021-03-06 DIAGNOSIS — I5022 Chronic systolic (congestive) heart failure: Secondary | ICD-10-CM | POA: Diagnosis not present

## 2021-03-06 DIAGNOSIS — E039 Hypothyroidism, unspecified: Secondary | ICD-10-CM | POA: Diagnosis not present

## 2021-03-06 DIAGNOSIS — I428 Other cardiomyopathies: Secondary | ICD-10-CM | POA: Diagnosis present

## 2021-03-06 DIAGNOSIS — N1832 Chronic kidney disease, stage 3b: Secondary | ICD-10-CM | POA: Diagnosis not present

## 2021-03-06 DIAGNOSIS — R1032 Left lower quadrant pain: Secondary | ICD-10-CM | POA: Diagnosis not present

## 2021-03-06 DIAGNOSIS — I130 Hypertensive heart and chronic kidney disease with heart failure and stage 1 through stage 4 chronic kidney disease, or unspecified chronic kidney disease: Secondary | ICD-10-CM | POA: Diagnosis not present

## 2021-03-06 DIAGNOSIS — Z87891 Personal history of nicotine dependence: Secondary | ICD-10-CM

## 2021-03-06 DIAGNOSIS — R531 Weakness: Secondary | ICD-10-CM

## 2021-03-06 DIAGNOSIS — Z981 Arthrodesis status: Secondary | ICD-10-CM

## 2021-03-06 DIAGNOSIS — N183 Chronic kidney disease, stage 3 unspecified: Secondary | ICD-10-CM | POA: Diagnosis present

## 2021-03-06 DIAGNOSIS — Z20822 Contact with and (suspected) exposure to covid-19: Secondary | ICD-10-CM | POA: Diagnosis not present

## 2021-03-06 DIAGNOSIS — I10 Essential (primary) hypertension: Secondary | ICD-10-CM | POA: Diagnosis not present

## 2021-03-06 DIAGNOSIS — Z9049 Acquired absence of other specified parts of digestive tract: Secondary | ICD-10-CM

## 2021-03-06 DIAGNOSIS — R109 Unspecified abdominal pain: Secondary | ICD-10-CM | POA: Diagnosis not present

## 2021-03-06 DIAGNOSIS — Z8249 Family history of ischemic heart disease and other diseases of the circulatory system: Secondary | ICD-10-CM | POA: Diagnosis not present

## 2021-03-06 DIAGNOSIS — D72828 Other elevated white blood cell count: Secondary | ICD-10-CM | POA: Diagnosis not present

## 2021-03-06 DIAGNOSIS — R079 Chest pain, unspecified: Secondary | ICD-10-CM | POA: Diagnosis not present

## 2021-03-06 DIAGNOSIS — R000 Tachycardia, unspecified: Secondary | ICD-10-CM | POA: Diagnosis not present

## 2021-03-06 DIAGNOSIS — Z809 Family history of malignant neoplasm, unspecified: Secondary | ICD-10-CM | POA: Diagnosis not present

## 2021-03-06 DIAGNOSIS — J449 Chronic obstructive pulmonary disease, unspecified: Secondary | ICD-10-CM | POA: Diagnosis present

## 2021-03-06 DIAGNOSIS — N200 Calculus of kidney: Secondary | ICD-10-CM | POA: Diagnosis not present

## 2021-03-06 DIAGNOSIS — N390 Urinary tract infection, site not specified: Secondary | ICD-10-CM | POA: Diagnosis not present

## 2021-03-06 DIAGNOSIS — E7800 Pure hypercholesterolemia, unspecified: Secondary | ICD-10-CM | POA: Diagnosis not present

## 2021-03-06 DIAGNOSIS — K8020 Calculus of gallbladder without cholecystitis without obstruction: Secondary | ICD-10-CM | POA: Diagnosis not present

## 2021-03-06 LAB — RESP PANEL BY RT-PCR (FLU A&B, COVID) ARPGX2
Influenza A by PCR: NEGATIVE
Influenza B by PCR: NEGATIVE
SARS Coronavirus 2 by RT PCR: NEGATIVE

## 2021-03-06 LAB — URINALYSIS, ROUTINE W REFLEX MICROSCOPIC
Bilirubin Urine: NEGATIVE
Glucose, UA: NEGATIVE mg/dL
Hgb urine dipstick: NEGATIVE
Ketones, ur: NEGATIVE mg/dL
Nitrite: NEGATIVE
Protein, ur: NEGATIVE mg/dL
Specific Gravity, Urine: 1.005 (ref 1.005–1.030)
pH: 7 (ref 5.0–8.0)

## 2021-03-06 LAB — BASIC METABOLIC PANEL
Anion gap: 14 (ref 5–15)
BUN: 16 mg/dL (ref 8–23)
CO2: 23 mmol/L (ref 22–32)
Calcium: 9.9 mg/dL (ref 8.9–10.3)
Chloride: 97 mmol/L — ABNORMAL LOW (ref 98–111)
Creatinine, Ser: 1.33 mg/dL — ABNORMAL HIGH (ref 0.44–1.00)
GFR, Estimated: 41 mL/min — ABNORMAL LOW (ref >60)
Glucose, Bld: 134 mg/dL — ABNORMAL HIGH (ref 70–99)
Potassium: 4.2 mmol/L (ref 3.5–5.1)
Sodium: 134 mmol/L — ABNORMAL LOW (ref 135–145)

## 2021-03-06 LAB — CBC
HCT: 42.8 % (ref 36.0–46.0)
Hemoglobin: 14.4 g/dL (ref 12.0–15.0)
MCH: 30.1 pg (ref 26.0–34.0)
MCHC: 33.6 g/dL (ref 30.0–36.0)
MCV: 89.5 fL (ref 80.0–100.0)
Platelets: 332 10*3/uL (ref 150–400)
RBC: 4.78 MIL/uL (ref 3.87–5.11)
RDW: 13.3 % (ref 11.5–15.5)
WBC: 15.1 10*3/uL — ABNORMAL HIGH (ref 4.0–10.5)
nRBC: 0 % (ref 0.0–0.2)

## 2021-03-06 LAB — HEPATIC FUNCTION PANEL
ALT: 29 U/L (ref 0–44)
AST: 39 U/L (ref 15–41)
Albumin: 4.1 g/dL (ref 3.5–5.0)
Alkaline Phosphatase: 50 U/L (ref 38–126)
Bilirubin, Direct: 0.1 mg/dL (ref 0.0–0.2)
Indirect Bilirubin: 0.7 mg/dL (ref 0.3–0.9)
Total Bilirubin: 0.8 mg/dL (ref 0.3–1.2)
Total Protein: 7.7 g/dL (ref 6.5–8.1)

## 2021-03-06 LAB — TROPONIN I (HIGH SENSITIVITY)
Troponin I (High Sensitivity): 6 ng/L (ref <18)
Troponin I (High Sensitivity): 7 ng/L (ref <18)

## 2021-03-06 LAB — TSH: TSH: 0.803 u[IU]/mL (ref 0.350–4.500)

## 2021-03-06 LAB — BRAIN NATRIURETIC PEPTIDE: B Natriuretic Peptide: 21 pg/mL (ref 0.0–100.0)

## 2021-03-06 LAB — LACTIC ACID, PLASMA
Lactic Acid, Venous: 2.3 mmol/L (ref 0.5–1.9)
Lactic Acid, Venous: 2.8 mmol/L (ref 0.5–1.9)

## 2021-03-06 MED ORDER — POLYETHYLENE GLYCOL 3350 17 G PO PACK: 17.0000 g | PACK | Freq: Every day | ORAL | Status: DC | PRN

## 2021-03-06 MED ORDER — HEPARIN SODIUM (PORCINE) 5000 UNIT/ML IJ SOLN
5000.0000 [IU] | Freq: Three times a day (TID) | INTRAMUSCULAR | Status: DC
  Administered 2021-03-06 – 2021-03-09 (×7): 5000 [IU] via SUBCUTANEOUS
  Filled 2021-03-06 (×7): qty 1

## 2021-03-06 MED ORDER — SODIUM CHLORIDE 0.9 % IV SOLN
1.0000 g | Freq: Once | INTRAVENOUS | Status: AC
  Administered 2021-03-06: 1 g via INTRAVENOUS
  Filled 2021-03-06: qty 10

## 2021-03-06 MED ORDER — LEVOTHYROXINE SODIUM 88 MCG PO TABS
88.0000 ug | ORAL_TABLET | Freq: Every day | ORAL | Status: DC
  Administered 2021-03-07 – 2021-03-09 (×3): 88 ug via ORAL
  Filled 2021-03-06 (×3): qty 1

## 2021-03-06 MED ORDER — ACETAMINOPHEN 650 MG RE SUPP: 650.0000 mg | Freq: Four times a day (QID) | RECTAL | Status: DC | PRN

## 2021-03-06 MED ORDER — ALPRAZOLAM 0.5 MG PO TABS
0.5000 mg | ORAL_TABLET | Freq: Two times a day (BID) | ORAL | Status: DC | PRN
  Administered 2021-03-07 – 2021-03-08 (×4): 0.5 mg via ORAL
  Filled 2021-03-06 (×4): qty 1

## 2021-03-06 MED ORDER — SPIRONOLACTONE 25 MG PO TABS
25.0000 mg | ORAL_TABLET | Freq: Every day | ORAL | Status: DC
  Administered 2021-03-07 – 2021-03-09 (×3): 25 mg via ORAL
  Filled 2021-03-06 (×3): qty 1

## 2021-03-06 MED ORDER — SODIUM CHLORIDE 0.9 % IV BOLUS: 1000.0000 mL | Freq: Once | INTRAVENOUS | Status: DC

## 2021-03-06 MED ORDER — MOMETASONE FURO-FORMOTEROL FUM 200-5 MCG/ACT IN AERO
2.0000 | INHALATION_SPRAY | Freq: Two times a day (BID) | RESPIRATORY_TRACT | Status: DC
  Administered 2021-03-06 – 2021-03-09 (×6): 2 via RESPIRATORY_TRACT
  Filled 2021-03-06: qty 8.8

## 2021-03-06 MED ORDER — ALBUTEROL SULFATE (2.5 MG/3ML) 0.083% IN NEBU
2.5000 mg | INHALATION_SOLUTION | Freq: Four times a day (QID) | RESPIRATORY_TRACT | Status: DC | PRN
  Administered 2021-03-08: 2.5 mg via RESPIRATORY_TRACT
  Filled 2021-03-06: qty 3

## 2021-03-06 MED ORDER — LORATADINE 10 MG PO TABS
10.0000 mg | ORAL_TABLET | Freq: Every day | ORAL | Status: DC
  Administered 2021-03-06 – 2021-03-08 (×3): 10 mg via ORAL
  Filled 2021-03-06 (×3): qty 1

## 2021-03-06 MED ORDER — ONDANSETRON HCL 4 MG/2ML IJ SOLN: 4.0000 mg | Freq: Four times a day (QID) | INTRAMUSCULAR | Status: DC | PRN

## 2021-03-06 MED ORDER — GABAPENTIN 300 MG PO CAPS
300.0000 mg | ORAL_CAPSULE | Freq: Three times a day (TID) | ORAL | Status: DC
  Administered 2021-03-06 – 2021-03-09 (×8): 300 mg via ORAL
  Filled 2021-03-06 (×8): qty 1

## 2021-03-06 MED ORDER — IOHEXOL 350 MG/ML SOLN
75.0000 mL | Freq: Once | INTRAVENOUS | Status: AC | PRN
  Administered 2021-03-06: 75 mL via INTRAVENOUS

## 2021-03-06 MED ORDER — SACUBITRIL-VALSARTAN 49-51 MG PO TABS
1.0000 | ORAL_TABLET | Freq: Two times a day (BID) | ORAL | Status: DC
  Administered 2021-03-06: 1 via ORAL
  Filled 2021-03-06 (×2): qty 1

## 2021-03-06 MED ORDER — IPRATROPIUM-ALBUTEROL 0.5-2.5 (3) MG/3ML IN SOLN
3.0000 mL | Freq: Once | RESPIRATORY_TRACT | Status: AC
  Administered 2021-03-06: 3 mL via RESPIRATORY_TRACT
  Filled 2021-03-06: qty 3

## 2021-03-06 MED ORDER — ACETAMINOPHEN 325 MG PO TABS
650.0000 mg | ORAL_TABLET | Freq: Four times a day (QID) | ORAL | Status: DC | PRN
  Administered 2021-03-08 – 2021-03-09 (×3): 650 mg via ORAL
  Filled 2021-03-06 (×3): qty 2

## 2021-03-06 MED ORDER — SODIUM CHLORIDE 0.9 % IV BOLUS
500.0000 mL | Freq: Once | INTRAVENOUS | Status: AC
Start: 2021-03-06 — End: 2021-03-06
  Administered 2021-03-06: 500 mL via INTRAVENOUS

## 2021-03-06 MED ORDER — DULOXETINE HCL 60 MG PO CPEP
60.0000 mg | ORAL_CAPSULE | Freq: Every day | ORAL | Status: DC
  Administered 2021-03-06 – 2021-03-09 (×4): 60 mg via ORAL
  Filled 2021-03-06 (×4): qty 1

## 2021-03-06 MED ORDER — LEVOCETIRIZINE DIHYDROCHLORIDE 5 MG PO TABS
5.0000 mg | ORAL_TABLET | Freq: Every evening | ORAL | Status: DC
Start: 2021-03-06 — End: 2021-03-06

## 2021-03-06 MED ORDER — SODIUM CHLORIDE 0.9 % IV SOLN: INTRAVENOUS | Status: DC

## 2021-03-06 MED ORDER — SODIUM CHLORIDE 0.9 % IV BOLUS
500.0000 mL | Freq: Once | INTRAVENOUS | Status: AC
  Administered 2021-03-06: 500 mL via INTRAVENOUS

## 2021-03-06 MED ORDER — SODIUM CHLORIDE 0.9 % IV SOLN
2.0000 g | INTRAVENOUS | Status: DC
  Administered 2021-03-07 – 2021-03-08 (×2): 2 g via INTRAVENOUS
  Filled 2021-03-06 (×2): qty 20

## 2021-03-06 MED ORDER — PANTOPRAZOLE SODIUM 20 MG PO TBEC
20.0000 mg | DELAYED_RELEASE_TABLET | Freq: Every day | ORAL | Status: DC
Start: 2021-03-06 — End: 2021-03-09
  Administered 2021-03-06 – 2021-03-07 (×2): 20 mg via ORAL
  Filled 2021-03-06 (×4): qty 1

## 2021-03-06 MED ORDER — ONDANSETRON HCL 4 MG PO TABS
4.0000 mg | ORAL_TABLET | Freq: Four times a day (QID) | ORAL | Status: DC | PRN
  Administered 2021-03-07: 4 mg via ORAL
  Filled 2021-03-06: qty 1

## 2021-03-06 NOTE — Progress Notes (Signed)
°   03/06/21 2000  Provider Notification  Provider Name/Title Dr. Josephine Cables & Dr. Denton Brick  Date Provider Notified 03/06/21  Time Provider Notified [phone]  Notification Type Page  Notification Reason Critical result  Test performed and critical result Lactic Acid  2.8  Date Critical Result Received 03/06/21  Time Critical Result Received 1916 (Never reported from Lab, resulted in chart.)  Provider response See new orders  Date of Provider Response 03/06/21  Time of Provider Response 2105   Notified Physician's of critical lactic. Nurse called lab to verify, lab tech stated that once a result is critical the first time they do not have to report the redraw value if it is critical. See new order in chart.

## 2021-03-06 NOTE — ED Notes (Signed)
Assumed care of pt at this time.  To room 14.

## 2021-03-06 NOTE — Progress Notes (Signed)
°   03/06/21 1852  Vitals  Temp 98.5 F (36.9 C)  Temp Source Oral  BP 140/83  MAP (mmHg) 97  Pulse Rate (!) 119  Resp 20  Level of Consciousness  Level of Consciousness Alert  MEWS COLOR  MEWS Score Color Yellow  Oxygen Therapy  SpO2 97 %  O2 Device Room Air  MEWS Score  MEWS Temp 0  MEWS Systolic 0  MEWS Pulse 2  MEWS RR 0  MEWS LOC 0  MEWS Score 2   MD notified

## 2021-03-06 NOTE — ED Triage Notes (Signed)
Advised by PCP that her heart rate was elevated 2 days ago but did not want to come to the hospital then, increased weakness since that time

## 2021-03-06 NOTE — ED Provider Notes (Signed)
[facility] Provider Note   CSN: [id number] Arrival date & time: 03/06/21  1056     History  Chief Complaint  Patient presents with   Weakness    Sheila [name] is a 81 y.o. female.  HPI   Pt is a 81 y/o with a h/o gastric ulcer, cancer, diverticulosis, GERD, HA, HLD, HTN, hypothyroidism, UTI,  NICM, EF- 20-25%, who presents to the ED today for eval of generalized weakness and fatigue that started 2 days ago. She was seen at her PCP that day and had an EKG was performed which showed an abnormal heart rate. She was advised to come tot he ED at that time but she refused to come to the hospital. She has felt progressively worse since that time which prompted her to come to the ED.   Reports chronic sob and cough that are unchanged. Denies chest pain, abd pain, vomiting, diarrhea, dysuria, frequency, melena, hematochezia.  Additional history provided by family at bedside. She states that the patient was c/o abd pain earlier today.   Home Medications Prior to Admission medications   Medication Sig Start Date End Date Taking? Authorizing Provider  albuterol (PROVENTIL) (2.5 MG/3ML) 0.083% nebulizer solution NEBULIZE 1 VIAL EVERY 6-8 HOURS AS NEEDED FOR WHEEZING OR SHORTNESS OF BREATH 01/20/21   [name] A, FNP  ALPRAZolam Duanne Moron) 0.5 MG tablet Take 1 tablet (0.5 mg total) by mouth 2 (two) times daily as needed for anxiety. 01/02/21   [name] A, FNP  Cholecalciferol (VITAMIN D3) 25 MCG (1000 UT) CAPS Take 1,000 Units by mouth daily.     [provider]  cinacalcet (SENSIPAR) 30 MG tablet Take 1 tablet (30 mg total) by mouth every other day. 05/02/20   Brita Romp, NP  diclofenac sodium (VOLTAREN) 1 % GEL Apply 2 g topically 4 (four) times daily. 09/26/18   Sharion Balloon, FNP  DULoxetine (CYMBALTA) 60 MG capsule TAKE 1 CAPUSLE ONCE DAILY 12/30/20   Sharion Balloon, FNP  ENTRESTO 49-51 MG TAKE 1 TABLET 2 TIMES A DAY 04/26/20   Lorretta Harp,  MD  fluticasone [facility]) 50 MCG/ACT nasal spray PLACE 2 SPRAYS INTO BOTH NOSTRILS DAILY 02/19/20   Hawks, Alyse Low A, FNP  fluticasone furoate-vilanterol (BREO ELLIPTA) 100-25 MCG/ACT AEPB Inhale 1 puff into the lungs daily. 10/16/20   [provider]  gabapentin (NEURONTIN) 300 MG capsule TAKE 1 CAPSULE 3 TIMES A DAY 12/17/20   Hawks, Alyse Low A, FNP  Garlic 10 MG CAPS Take by mouth.    [provider]  hydrOXYzine (VISTARIL) 25 MG capsule TAKE ONE CAPSULE THREE TIMES DAILY AS NEEDED 03/03/21   [name] A, FNP  levocetirizine (XYZAL) 5 MG tablet TAKE 1 TABLET ONCE DAILY IN THE EVENING 12/05/20   [name] A, FNP  levothyroxine (SYNTHROID) 88 MCG tablet TAKE 1 TABLET DAILY 12/17/20   [name] A, FNP  LIVALO 4 MG TABS TAKE 1 TABLET DAILY 01/29/21   Hassell Done, Mary-Margaret, FNP  meclizine (ANTIVERT) 25 MG tablet Take 25 mg by mouth 3 (three) times daily as needed. 03/04/21   [provider]  Multiple Vitamin (MULTIVITAMIN WITH MINERALS) TABS tablet Take 1 tablet by mouth daily.    [provider]  nystatin (MYCOSTATIN/NYSTOP) powder Apply 1 application topically 3 (three) times daily. 09/06/20   Ivy Lynn, NP  Omega-3 Fatty Acids (FISH OIL OMEGA-3 PO) Take 1 capsule by mouth every evening.     [provider]  pantoprazole (  PROTONIX) 20 MG tablet TAKE ONE TABLET AT BEDTIME 02/25/21   Ivy Lynn, NP  PROLIA 60 MG/ML SOSY injection Inject into the skin once. 02/28/21   [provider]  spironolactone (ALDACTONE) 25 MG tablet TAKE 1 TABLET ONCE DAILY 04/26/20   Lorretta Harp, MD  SYMBICORT 160-4.5 MCG/ACT inhaler INHALE 2 PUFFS TWICE DAILY 12/30/20   [name] A, FNP  Tetrahydrozoline HCl (VISINE OP) Place 1 drop into both eyes daily as needed (dry eyes/itching).    [provider]  torsemide (DEMADEX) 20 MG tablet TAKE 2 TABLETS DAILY AS DIRECTED 07/31/20   Elouise Munroe, MD  traMADol (ULTRAM) 50 MG tablet TAKE  TWO TABLETS EVERY TWELVE HOURS AS NEEDED FOR PAIN 03/05/21   [name] A, FNP  triamcinolone ointment (KENALOG) 0.5 % Apply 1 application topically 2 (two) times daily. 12/24/20   Sharion Balloon, FNP      Allergies    Ibuprofen, Benadryl [diphenhydramine hcl], Lipitor [atorvastatin], Baclofen, Hydrocodone, Codeine, Morphine and related, and Tdap [tetanus-diphth-acell pertussis]    Review of Systems   Review of Systems  Constitutional:  Negative for chills and fever.  HENT:  Negative for ear pain and sore throat.   Eyes:  Negative for visual disturbance.  Respiratory:  Positive for cough (chronic) and shortness of breath (chronic).   Cardiovascular:  Negative for chest pain and leg swelling.  Gastrointestinal:  Negative for abdominal pain, constipation, diarrhea, nausea and vomiting.  Genitourinary:  Negative for dysuria, frequency and hematuria.  Musculoskeletal:  Negative for back pain.  Skin:  Negative for rash.  Neurological:  Positive for weakness. Negative for light-headedness and headaches.  All other systems reviewed and are negative.  Physical Exam Updated Vital Signs BP 116/78    Pulse 100    Temp 98.8 F (37.1 C) (Rectal)    Resp 20    SpO2 91%  Physical Exam Vitals and nursing note reviewed.  Constitutional:      General: She is not in acute distress.    Appearance: She is well-developed.  HENT:     Head: Normocephalic and atraumatic.     Mouth/Throat:     Mouth: Mucous membranes are dry.  Eyes:     Conjunctiva/sclera: Conjunctivae normal.  Cardiovascular:     Rate and Rhythm: Regular rhythm. Tachycardia present.     Heart sounds: No murmur heard. Pulmonary:     Effort: Pulmonary effort is normal. No respiratory distress.     Breath sounds: Wheezing present.  Abdominal:     General: Bowel sounds are normal.     Palpations: Abdomen is soft.     Tenderness: There is no abdominal tenderness. There is no guarding or rebound.  Musculoskeletal:        General:  No swelling.     Cervical back: Neck supple.     Comments: Trace ble edema  Skin:    General: Skin is warm and dry.     Capillary Refill: Capillary refill takes less than 2 seconds.  Neurological:     Mental Status: She is alert.  Psychiatric:        Mood and Affect: Mood normal.    ED Results / Procedures / Treatments   Labs (all labs ordered are listed, but only abnormal results are displayed) Labs Reviewed  BASIC METABOLIC PANEL - Abnormal; Notable for the following components:      Result Value   Sodium 134 (*)    Chloride 97 (*)    Glucose,  Bld 134 (*)    Creatinine, Ser 1.33 (*)    GFR, Estimated 41 (*)    All other components within normal limits  CBC - Abnormal; Notable for the following components:   WBC 15.1 (*)    All other components within normal limits  URINALYSIS, ROUTINE W REFLEX MICROSCOPIC - Abnormal; Notable for the following components:   Color, Urine STRAW (*)    APPearance HAZY (*)    Leukocytes,Ua TRACE (*)    Bacteria, UA RARE (*)    All other components within normal limits  LACTIC ACID, PLASMA - Abnormal; Notable for the following components:   Lactic Acid, Venous 2.3 (*)    All other components within normal limits  RESP PANEL BY RT-PCR (FLU A&B, COVID) ARPGX2  CULTURE, BLOOD (ROUTINE X 2)  CULTURE, BLOOD (ROUTINE X 2)  URINE CULTURE  BRAIN NATRIURETIC PEPTIDE  HEPATIC FUNCTION PANEL  LACTIC ACID, PLASMA  TSH  TROPONIN I (HIGH SENSITIVITY)  TROPONIN I (HIGH SENSITIVITY)    EKG EKG Interpretation  Date/Time:  Thursday March 06 2021 11:21:17 EST Ventricular Rate:  124 PR Interval:  152 QRS Duration: 86 QT Interval:  266 QTC Calculation: 382 R Axis:   -4 Text Interpretation: Sinus tachycardia Low voltage QRS Cannot rule out Anterior infarct , age undetermined Abnormal ECG When compared with ECG of 24-Oct-2019 20:24, PREVIOUS ECG IS PRESENT Since last tracing rate faster Confirmed by Isla Pence [phone]) on 03/06/2021 1:33:16  PM  Radiology DG Chest 2 View  Result Date: 03/06/2021 CLINICAL DATA:  Tachycardia.  Increased weakness. EXAM: CHEST - 2 VIEW COMPARISON:  06/28/2020 FINDINGS: Normal sized heart. Tortuous aorta. Clear lungs with normal vascularity. No acute bony abnormality. Lumbar spine fixation hardware. IMPRESSION: No acute abnormality. Electronically Signed   By: Claudie Revering M.D.   On: 03/06/2021 11:53   CT Angio Chest PE W and/or Wo Contrast  Result Date: 03/06/2021 CLINICAL DATA:  Left lower quadrant pain tachycardia weakness EXAM: CT ANGIOGRAPHY CHEST CT ABDOMEN AND PELVIS WITH CONTRAST TECHNIQUE: Multidetector CT imaging of the chest was performed using the standard protocol during bolus administration of intravenous contrast. Multiplanar CT image reconstructions and MIPs were obtained to evaluate the vascular anatomy. Multidetector CT imaging of the abdomen and pelvis was performed using the standard protocol during bolus administration of intravenous contrast. RADIATION DOSE REDUCTION: This exam was performed according to the departmental dose-optimization program which includes automated exposure control, adjustment of the mA and/or kV according to patient size and/or use of iterative reconstruction technique. CONTRAST:  31mL OMNIPAQUE IOHEXOL 350 MG/ML SOLN COMPARISON:  Chest x-ray 03/06/2021, CT 08/29/2018 FINDINGS: CTA CHEST FINDINGS Cardiovascular: Satisfactory opacification of the pulmonary arteries to the segmental level. No evidence of pulmonary embolism. Mild motion degradation limits the exam. Cardiomegaly. No pericardial effusion. Mild atherosclerosis. No aneurysm or dissection. 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 Coronary vascular calcification. Mediastinum/Nodes: Midline trachea. No thyroid mass. No suspicious lymph nodes. Esophagus within normal limits. Lungs/Pleura: No consolidation, pleural effusion, or pneumothorax.  Mild bandlike scarring within the subpleural lower lobes. Musculoskeletal: Sternum is intact. Chronic inferior endplate fracture at Z02. Review of the MIP images confirms the above findings. CT ABDOMEN and PELVIS FINDINGS Hepatobiliary: Gallstone. No biliary dilatation. No focal hepatic abnormality Pancreas: Unremarkable. No pancreatic ductal dilatation or surrounding inflammatory changes. Spleen: Normal in size without focal abnormality. Adrenals/Urinary Tract: Adrenal glands are normal. Nonobstructing stone in the lower pole of the right kidney measuring 9  mm. Subcentimeter hypodensities in the left kidney too small to further characterize. The bladder is normal Stomach/Bowel: The stomach is nonenlarged. No dilated small bowel. Diverticular disease of the colon without acute inflammatory process. Status post appendectomy. Vascular/Lymphatic: Moderate severe aortic atherosclerosis without aneurysm. No suspicious lymph nodes Reproductive: Uterus and bilateral adnexa are unremarkable. Other: Negative for pelvic effusion or free air. Moderate fat containing umbilical hernia Musculoskeletal: Posterior fusion hardware at L4-L5. Chronic inferior endplate fracture at X91. Moderate severe compression fracture at L1 with about 6 mm retropulsion that results in mild canal stenosis. The appearance suggests chronic fracture but this is new since 2020. Review of the MIP images confirms the above findings. IMPRESSION: 1. Negative for acute pulmonary embolus. Cardiomegaly without acute airspace disease. 2. No CT evidence for acute intra-abdominal or pelvic abnormality. 3. Gallstones 4. Nonobstructing right kidney stone 5. 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 Moderate severe compression fracture L1, suspect chronic but is new as compared with CT from 2020 7. Diverticular disease of the colon without acute wall thickening Electronically Signed   By: Donavan Foil M.D.   On: 03/06/2021 16:28   CT ABDOMEN  PELVIS W CONTRAST  Result Date: 03/06/2021 CLINICAL DATA:  Left lower quadrant pain tachycardia weakness EXAM: CT ANGIOGRAPHY CHEST CT ABDOMEN AND PELVIS WITH CONTRAST TECHNIQUE: Multidetector CT imaging of the chest was performed using the standard protocol during bolus administration of intravenous contrast. Multiplanar CT image reconstructions and MIPs were obtained to evaluate the vascular anatomy. Multidetector CT imaging of the abdomen and pelvis was performed using the standard protocol during bolus administration of intravenous contrast. RADIATION DOSE REDUCTION: This exam was performed according to the departmental dose-optimization program which includes automated exposure control, adjustment of the mA and/or kV according to patient size and/or use of iterative reconstruction technique. CONTRAST:  10mL OMNIPAQUE IOHEXOL 350 MG/ML SOLN COMPARISON:  Chest x-ray 03/06/2021, CT 08/29/2018 FINDINGS: CTA CHEST FINDINGS Cardiovascular: Satisfactory opacification of the pulmonary arteries to the segmental level. No evidence of pulmonary embolism. Mild motion degradation limits the exam. Cardiomegaly. No pericardial effusion. Mild atherosclerosis. No aneurysm or dissection. 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 Coronary vascular calcification. Mediastinum/Nodes: Midline trachea. No thyroid mass. No suspicious lymph nodes. Esophagus within normal limits. Lungs/Pleura: No consolidation, pleural effusion, or pneumothorax. Mild bandlike scarring within the subpleural lower lobes. Musculoskeletal: Sternum is intact. Chronic inferior endplate fracture at Y78. Review of the MIP images confirms the above findings. CT ABDOMEN and PELVIS FINDINGS Hepatobiliary: Gallstone. No biliary dilatation. No focal hepatic abnormality Pancreas: Unremarkable. No pancreatic ductal dilatation or surrounding inflammatory changes. Spleen: Normal in size without  focal abnormality. Adrenals/Urinary Tract: Adrenal glands are normal. Nonobstructing stone in the lower pole of the right kidney measuring 9 mm. Subcentimeter hypodensities in the left kidney too small to further characterize. The bladder is normal Stomach/Bowel: The stomach is nonenlarged. No dilated small bowel. Diverticular disease of the colon without acute inflammatory process. Status post appendectomy. Vascular/Lymphatic: Moderate severe aortic atherosclerosis without aneurysm. No suspicious lymph nodes Reproductive: Uterus and bilateral adnexa are unremarkable. Other: Negative for pelvic effusion or free air. Moderate fat containing umbilical hernia Musculoskeletal: Posterior fusion hardware at L4-L5. Chronic inferior endplate fracture at G95. Moderate severe compression fracture at L1 with about 6 mm retropulsion that results in mild canal stenosis. The appearance suggests chronic fracture but this is new since 2020. Review of the MIP images confirms the above findings. IMPRESSION: 1. Negative  for acute pulmonary embolus. Cardiomegaly without acute airspace disease. 2. No CT evidence for acute intra-abdominal or pelvic abnormality. 3. Gallstones 4. Nonobstructing right kidney stone 5. 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 Moderate severe compression fracture L1, suspect chronic but is new as compared with CT from 2020 7. Diverticular disease of the colon without acute wall thickening Electronically Signed   By: Donavan Foil M.D.   On: 03/06/2021 16:28    Procedures Procedures   6:18 PM Cardiac monitoring reveals sinus tach 125 (Rate & rhythm), as reviewed and interpreted by me. Cardiac monitoring was ordered due to weakness, tachycardia and to monitor patient for dysrhythmia.   Medications Ordered in ED Medications  ipratropium-albuterol (DUONEB) 0.5-2.5 (3) MG/3ML nebulizer solution 3 mL (3 mLs Nebulization Given 03/06/21 1350)  iohexol (OMNIPAQUE) 350 MG/ML injection 75  mL (75 mLs Intravenous Contrast Given 03/06/21 1530)  cefTRIAXone (ROCEPHIN) 1 g in sodium chloride 0.9 % 100 mL IVPB (0 g Intravenous Stopped 03/06/21 1746)  sodium chloride 0.9 % bolus 500 mL (0 mLs Intravenous Stopped 03/06/21 1803)    ED Course/ Medical Decision Making/ A&P                           Medical Decision Making  81 y/o female here for generalized weakness and tachycardia  Reviewed/interpreted labs CBC with mild leukocytosis, no anemia BMP with mild hyponatremia and hypochloremia, mildly elevated cr which is stable from prior LFTS wnl Trop neg BNP neg UA with trace leukocytos, 0-5 rbc, 11-20 wbc and rare bacteria COVID/flu negative  EKG - Sinus tachycardia Low voltage QRS Cannot rule out Anterior infarct , age undetermined Abnormal ECG When compared with ECG of 24-Oct-2019 20:24, PREVIOUS ECG IS PRESENT Since last tracing rate faster   Reviewed/interpreted imaging CXR - No acute abnormality CTA chest/abd/pelvis -  1. Negative for acute pulmonary embolus. Cardiomegaly without acute airspace disease. 2. No CT evidence for acute intra-abdominal or pelvic abnormality. 3. Gallstones 4. Nonobstructing right kidney stone 5. 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 Moderate severe compression fracture L1, suspect chronic but is new as compared with CT from 2020 7. Diverticular disease of the colon without acute wall thickening  Pt with persistent tachycardia in the ED. W/u reveals leukocytosis and tachycardia. Pt is afebrile. She does meet sirs criteria. She has a mild appearing UTI though this is not clearly the cause of her overall presentation. She was given ceftriaxone to cover this. given her profound weakness and I feel that patient will require admission with further monitoring. We will also start her on gentle hydration as she does seem mildly dehydrated.   Case discussed with Dr. Sabra Heck who agrees with admission   5:30 PM CONSULT with Dr. Denton Brick who  is in agreement with the plan for admission    Final Clinical Impression(s) / ED Diagnoses Final diagnoses:  Weakness    Rx / DC Orders ED Discharge Orders     None         Bishop Dublin 03/06/21 1818    Noemi Chapel, MD 03/08/21 2020

## 2021-03-06 NOTE — ED Notes (Signed)
Attempted to call report-no answer 

## 2021-03-06 NOTE — ED Notes (Signed)
Pt given ice pack of infiltrated area on her right forearm area.

## 2021-03-06 NOTE — H&P (Addendum)
History and Physical    Sheila [name] SWN:[id number] DOB: November 17, 1940 DOA: 03/06/2021  PCP: Sharion Balloon, FNP   Patient coming from: Home  I have personally briefly reviewed greater than old medical records in [place]  Chief Complaint: weakness, fast heart rate  HPI: Sheila [name] is a 81 y.o. female with medical history significant for COPD,, NICM, peptic ulcer disease, CKD 3. Patient presented to the ED with complaints of increasing weakness.  She saw her primary care provider for same about 2 days ago, was told that her heart rate was fast when she should go to the ED. she declined and came to the ED today.  She reports chronic difficulty breathing and cough that are both unchanged.  No vomiting no loose stools.  No abdominal pain.  No pain with urination.  No headache or neck pains.  ED Course: Heart rate ranging from 75-126.  Respiratory rate 18-21.  Blood pressure systolic 938-1 82X.  O2 sats 91 to 97% on room air.  UA with trace leukocytes rare bacteria.  Chest x-ray without acute abnormality.  CTA chest and CT abdomen and pelvis without acute abnormality.  Leukocytosis of 15.1. IV ceftriaxone started for possible UTI,  In setting of SIRS.  Hospitalist to admit.  Review of Systems: As per HPI all other systems reviewed and negative.  Past Medical History:  Diagnosis Date   Allergy    Bell's palsy    Cancer ([place])    skin CA on face   Cataract    Chest pain 07/2009   Myoveiw stress test negative.   Depression    Diverticulosis of colon    DJD (degenerative joint disease) of knee    Dyspnea    on exertion   Family history of adverse reaction to anesthesia    sister ha nausea/vomiting   Gastric ulcer with hemorrhage 01/14/2011   GERD (gastroesophageal reflux disease)    Headache    History of fractured pelvis    Hypercholesteremia    Hypertension    Hypothyroidism    LV dysfunction    UTI (lower urinary tract infection)     Past Surgical History:  Procedure  Laterality Date   APPENDECTOMY     BREAST BIOPSY Left    ESOPHAGOGASTRODUODENOSCOPY  01/14/2011   Procedure: ESOPHAGOGASTRODUODENOSCOPY (EGD);  Surgeon: Rogene Houston, MD;  Location: [facility];  Service: Endoscopy;  Laterality: N/A;   HIP SURGERY     pelvis   JOINT REPLACEMENT     KNEE SURGERY  04/2010.   Total left knee replacement   LAPAROSCOPIC APPENDECTOMY N/A 08/01/2013   Procedure: APPENDECTOMY LAPAROSCOPIC;  Surgeon: Jamesetta So, MD;  Location: [facility];  Service: General;  Laterality: N/A;   RIGHT/LEFT HEART CATH AND CORONARY ANGIOGRAPHY N/A 10/02/2019   Procedure: RIGHT/LEFT HEART CATH AND CORONARY ANGIOGRAPHY;  Surgeon: Lorretta Harp, MD;  Location: [facility];  Service: Cardiovascular;  Laterality: N/A;     reports that she quit smoking about 53 years ago. Her smoking use included cigarettes. Her smokeless tobacco use includes snuff. She reports that she does not drink alcohol and does not use drugs.  Allergies  Allergen Reactions   Ibuprofen Other (See Comments)    History of bleeding ulcers - contra-indicated    Benadryl [Diphenhydramine Hcl] Other (See Comments)    Worsening depression    Lipitor [Atorvastatin] Other (See Comments)    Body aches.   Baclofen Other (See Comments)    Loopy  Hydrocodone Nausea And Vomiting   Codeine Nausea And Vomiting   Morphine And Related Nausea And Vomiting   Tdap [Tetanus-Diphth-Acell Pertussis] Swelling    Redness and localized swelling at injection site     Family History  Problem Relation Age of Onset   Aneurysm Mother    Stroke Mother    Hypertension Mother    Cancer Father        lung   Heart disease Sister    Hip fracture Sister    Cancer Brother    Alzheimer's disease Sister    Cancer Sister        skin, uterine   Cancer Brother    Prior to Admission medications   Medication Sig Start Date End Date Taking? Authorizing Provider  albuterol (PROVENTIL) (2.5 MG/3ML) 0.083% nebulizer solution  NEBULIZE 1 VIAL EVERY 6-8 HOURS AS NEEDED FOR WHEEZING OR SHORTNESS OF BREATH 01/20/21  Yes Hawks, Christy A, FNP  ALPRAZolam (XANAX) 0.5 MG tablet Take 1 tablet (0.5 mg total) by mouth 2 (two) times daily as needed for anxiety. Patient taking differently: Take 0.5 mg by mouth 3 (three) times daily as needed for anxiety. 01/02/21  Yes Hawks, Christy A, FNP  Cholecalciferol (VITAMIN D3) 25 MCG (1000 UT) CAPS Take 1,000 Units by mouth daily.    Yes [provider]  cinacalcet (SENSIPAR) 30 MG tablet Take 1 tablet (30 mg total) by mouth every other day. 05/02/20  Yes Brita Romp, NP  CRANBERRY PO Take by mouth.   Yes [provider]  diclofenac sodium (VOLTAREN) 1 % GEL Apply 2 g topically 4 (four) times daily. 09/26/18  Yes Hawks, Christy A, FNP  DULoxetine (CYMBALTA) 60 MG capsule TAKE 1 CAPUSLE ONCE DAILY Patient taking differently: Take 60 mg by mouth daily. 12/30/20  Yes Hawks, Christy A, FNP  ENTRESTO 49-51 MG TAKE 1 TABLET 2 TIMES A DAY Patient taking differently: Take 1 tablet by mouth daily. 04/26/20  Yes Lorretta Harp, MD  fluticasone [facility]) 50 MCG/ACT nasal spray PLACE 2 SPRAYS INTO BOTH NOSTRILS DAILY 02/19/20  Yes Hawks, Alyse Low A, FNP  gabapentin (NEURONTIN) 300 MG capsule TAKE 1 CAPSULE 3 TIMES A DAY 12/17/20  Yes Hawks, Christy A, FNP  Garlic 10 MG CAPS Take by mouth.   Yes [provider]  hydrOXYzine (VISTARIL) 25 MG capsule TAKE ONE CAPSULE THREE TIMES DAILY AS NEEDED Patient taking differently: Take 25 mg by mouth 3 (three) times daily. 03/03/21  Yes Hawks, Christy A, FNP  levocetirizine (XYZAL) 5 MG tablet TAKE 1 TABLET ONCE DAILY IN THE EVENING Patient taking differently: Take 5 mg by mouth every evening. 12/05/20  Yes Hawks, Alyse Low A, FNP  levothyroxine (SYNTHROID) 88 MCG tablet TAKE 1 TABLET DAILY 12/17/20  Yes [name] A, FNP  LIVALO 4 MG TABS TAKE 1 TABLET DAILY 01/29/21  Yes Hassell Done, Mary-Margaret, FNP  meclizine (ANTIVERT) 25 MG  tablet Take 25 mg by mouth 3 (three) times daily as needed. 03/04/21  Yes [provider]  Multiple Vitamin (MULTIVITAMIN WITH MINERALS) TABS tablet Take 1 tablet by mouth daily.   Yes [provider]  nystatin (MYCOSTATIN/NYSTOP) powder Apply 1 application topically 3 (three) times daily. 09/06/20  Yes Ivy Lynn, NP  Omega-3 Fatty Acids (FISH OIL OMEGA-3 PO) Take 1 capsule by mouth every evening.    Yes [provider]  pantoprazole (PROTONIX) 20 MG tablet TAKE ONE TABLET AT BEDTIME 02/25/21  Yes Ivy Lynn, NP  spironolactone (ALDACTONE) 25 MG tablet  TAKE 1 TABLET ONCE DAILY 04/26/20  Yes Lorretta Harp, MD  SYMBICORT 160-4.5 MCG/ACT inhaler INHALE 2 PUFFS TWICE DAILY 12/30/20  Yes Hawks, Christy A, FNP  Tetrahydrozoline HCl (VISINE OP) Place 1 drop into both eyes daily as needed (dry eyes/itching).   Yes [provider]  torsemide (DEMADEX) 20 MG tablet TAKE 2 TABLETS DAILY AS DIRECTED Patient taking differently: Take 20 mg by mouth 2 (two) times daily. 07/31/20  Yes Elouise Munroe, MD  traMADol (ULTRAM) 50 MG tablet TAKE TWO TABLETS EVERY TWELVE HOURS AS NEEDED FOR PAIN 03/05/21  Yes Hawks, Christy A, FNP  PROLIA 60 MG/ML SOSY injection Inject into the skin once. 02/28/21   [provider]  triamcinolone ointment (KENALOG) 0.5 % Apply 1 application topically 2 (two) times daily. Patient not taking: Reported on 03/06/2021 12/24/20   Sharion Balloon, FNP    Physical Exam: Vitals:   03/06/21 1700 03/06/21 1731 03/06/21 1801 03/06/21 1852  BP: 118/80  116/78 140/83  Pulse: (!) 110  100 (!) 119  Resp: (!) 21 18 20 20   Temp:    98.5 F (36.9 C)  TempSrc:    Oral  SpO2: 96%  91% 97%    Constitutional: NAD, calm, comfortable Vitals:   03/06/21 1700 03/06/21 1731 03/06/21 1801 03/06/21 1852  BP: 118/80  116/78 140/83  Pulse: (!) 110  100 (!) 119  Resp: (!) 21 18 20 20   Temp:    98.5 F (36.9 C)  TempSrc:    Oral  SpO2: 96%  91% 97%    Eyes: PERRL, lids and conjunctivae normal ENMT: Mucous membranes are moist. Posterior pharynx clear of any exudate or lesions.Normal dentition.  Neck: normal, supple, no masses, no thyromegaly Respiratory: clear to auscultation bilaterally, no wheezing, no crackles. Normal respiratory effort. No accessory muscle use.  Cardiovascular: Tachycardiac, regular rate and rhythm, no murmurs / rubs / gallops. No extremity edema. 2+ pedal pulses. No carotid bruits.  Abdomen: no tenderness, no masses palpated. No hepatosplenomegaly. Bowel sounds positive.  Musculoskeletal: no clubbing / cyanosis. No joint deformity upper and lower extremities. Good ROM, no contractures. Normal muscle tone.  Skin: no rashes, lesions, ulcers. No induration Neurologic: No apparent cranial nerve abnormality, moving extremities spontaneously Psychiatric: Normal judgment and insight. Alert and oriented x 3. Normal mood.   Labs on Admission: I have personally reviewed following labs and imaging studies  CBC: Recent Labs  Lab 03/06/21 1142  WBC 15.1*  HGB 14.4  HCT 42.8  MCV 89.5  PLT 466   Basic Metabolic Panel: Recent Labs  Lab 03/06/21 1142  NA 134*  K 4.2  CL 97*  CO2 23  GLUCOSE 134*  BUN 16  CREATININE 1.33*  CALCIUM 9.9   GFR: Estimated Creatinine Clearance: 36.9 mL/min (A) (by C-G formula based on SCr of 1.33 mg/dL (H)). Liver Function Tests: Recent Labs  Lab 03/06/21 1334  AST 39  ALT 29  ALKPHOS 50  BILITOT 0.8  PROT 7.7  ALBUMIN 4.1   Thyroid Function Tests: Recent Labs    03/06/21 1659  TSH 0.803   Anemia Panel: No results for input(s): VITAMINB12, FOLATE, FERRITIN, TIBC, IRON, RETICCTPCT in the last 72 hours. Urine analysis:    Component Value Date/Time   COLORURINE STRAW (A) 03/06/2021 1351   APPEARANCEUR HAZY (A) 03/06/2021 1351   APPEARANCEUR Clear 07/15/2020 1006   LABSPEC 1.005 03/06/2021 1351   PHURINE 7.0 03/06/2021 1351   GLUCOSEU NEGATIVE 03/06/2021 1351    HGBUR NEGATIVE  03/06/2021 [place] 03/06/2021 1351   BILIRUBINUR Negative 07/15/2020 1006   KETONESUR NEGATIVE 03/06/2021 1351   PROTEINUR NEGATIVE 03/06/2021 1351   UROBILINOGEN 0.2 07/31/2013 1442   NITRITE NEGATIVE 03/06/2021 1351   LEUKOCYTESUR TRACE (A) 03/06/2021 1351    Radiological Exams on Admission: DG Chest 2 View  Result Date: 03/06/2021 CLINICAL DATA:  Tachycardia.  Increased weakness. EXAM: CHEST - 2 VIEW COMPARISON:  06/28/2020 FINDINGS: Normal sized heart. Tortuous aorta. Clear lungs with normal vascularity. No acute bony abnormality. Lumbar spine fixation hardware. IMPRESSION: No acute abnormality. Electronically Signed   By: Claudie Revering M.D.   On: 03/06/2021 11:53   CT Angio Chest PE W and/or Wo Contrast  Result Date: 03/06/2021 CLINICAL DATA:  Left lower quadrant pain tachycardia weakness EXAM: CT ANGIOGRAPHY CHEST CT ABDOMEN AND PELVIS WITH CONTRAST TECHNIQUE: Multidetector CT imaging of the chest was performed using the standard protocol during bolus administration of intravenous contrast. Multiplanar CT image reconstructions and MIPs were obtained to evaluate the vascular anatomy. Multidetector CT imaging of the abdomen and pelvis was performed using the standard protocol during bolus administration of intravenous contrast. RADIATION DOSE REDUCTION: This exam was performed according to the departmental dose-optimization program which includes automated exposure control, adjustment of the mA and/or kV according to patient size and/or use of iterative reconstruction technique. CONTRAST:  64mL OMNIPAQUE IOHEXOL 350 MG/ML SOLN COMPARISON:  Chest x-ray 03/06/2021, CT 08/29/2018 FINDINGS: CTA CHEST FINDINGS Cardiovascular: Satisfactory opacification of the pulmonary arteries to the segmental level. No evidence of pulmonary embolism. Mild motion degradation limits the exam. Cardiomegaly. No pericardial effusion. Mild atherosclerosis. No aneurysm or dissection.  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 Coronary vascular calcification. Mediastinum/Nodes: Midline trachea. No thyroid mass. No suspicious lymph nodes. Esophagus within normal limits. Lungs/Pleura: No consolidation, pleural effusion, or pneumothorax. Mild bandlike scarring within the subpleural lower lobes. Musculoskeletal: Sternum is intact. Chronic inferior endplate fracture at T26. Review of the MIP images confirms the above findings. CT ABDOMEN and PELVIS FINDINGS Hepatobiliary: Gallstone. No biliary dilatation. No focal hepatic abnormality Pancreas: Unremarkable. No pancreatic ductal dilatation or surrounding inflammatory changes. Spleen: Normal in size without focal abnormality. Adrenals/Urinary Tract: Adrenal glands are normal. Nonobstructing stone in the lower pole of the right kidney measuring 9 mm. Subcentimeter hypodensities in the left kidney too small to further characterize. The bladder is normal Stomach/Bowel: The stomach is nonenlarged. No dilated small bowel. Diverticular disease of the colon without acute inflammatory process. Status post appendectomy. Vascular/Lymphatic: Moderate severe aortic atherosclerosis without aneurysm. No suspicious lymph nodes Reproductive: Uterus and bilateral adnexa are unremarkable. Other: Negative for pelvic effusion or free air. Moderate fat containing umbilical hernia Musculoskeletal: Posterior fusion hardware at L4-L5. Chronic inferior endplate fracture at Z12. Moderate severe compression fracture at L1 with about 6 mm retropulsion that results in mild canal stenosis. The appearance suggests chronic fracture but this is new since 2020. Review of the MIP images confirms the above findings. IMPRESSION: 1. Negative for acute pulmonary embolus. Cardiomegaly without acute airspace disease. 2. No CT evidence for acute intra-abdominal or pelvic abnormality. 3. Gallstones 4. Nonobstructing right  kidney stone 5. 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 Moderate severe compression fracture L1, suspect chronic but is new as compared with CT from 2020 7. Diverticular disease of the colon without acute wall thickening Electronically Signed   By: Donavan Foil M.D.   On: 03/06/2021 16:28   CT ABDOMEN PELVIS  W CONTRAST  Result Date: 03/06/2021 CLINICAL DATA:  Left lower quadrant pain tachycardia weakness EXAM: CT ANGIOGRAPHY CHEST CT ABDOMEN AND PELVIS WITH CONTRAST TECHNIQUE: Multidetector CT imaging of the chest was performed using the standard protocol during bolus administration of intravenous contrast. Multiplanar CT image reconstructions and MIPs were obtained to evaluate the vascular anatomy. Multidetector CT imaging of the abdomen and pelvis was performed using the standard protocol during bolus administration of intravenous contrast. RADIATION DOSE REDUCTION: This exam was performed according to the departmental dose-optimization program which includes automated exposure control, adjustment of the mA and/or kV according to patient size and/or use of iterative reconstruction technique. CONTRAST:  52mL OMNIPAQUE IOHEXOL 350 MG/ML SOLN COMPARISON:  Chest x-ray 03/06/2021, CT 08/29/2018 FINDINGS: CTA CHEST FINDINGS Cardiovascular: Satisfactory opacification of the pulmonary arteries to the segmental level. No evidence of pulmonary embolism. Mild motion degradation limits the exam. Cardiomegaly. No pericardial effusion. Mild atherosclerosis. No aneurysm or dissection. 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 Coronary vascular calcification. Mediastinum/Nodes: Midline trachea. No thyroid mass. No suspicious lymph nodes. Esophagus within normal limits. Lungs/Pleura: No consolidation, pleural effusion, or pneumothorax. Mild bandlike scarring within the subpleural lower lobes. Musculoskeletal: Sternum is intact.  Chronic inferior endplate fracture at J94. Review of the MIP images confirms the above findings. CT ABDOMEN and PELVIS FINDINGS Hepatobiliary: Gallstone. No biliary dilatation. No focal hepatic abnormality Pancreas: Unremarkable. No pancreatic ductal dilatation or surrounding inflammatory changes. Spleen: Normal in size without focal abnormality. Adrenals/Urinary Tract: Adrenal glands are normal. Nonobstructing stone in the lower pole of the right kidney measuring 9 mm. Subcentimeter hypodensities in the left kidney too small to further characterize. The bladder is normal Stomach/Bowel: The stomach is nonenlarged. No dilated small bowel. Diverticular disease of the colon without acute inflammatory process. Status post appendectomy. Vascular/Lymphatic: Moderate severe aortic atherosclerosis without aneurysm. No suspicious lymph nodes Reproductive: Uterus and bilateral adnexa are unremarkable. Other: Negative for pelvic effusion or free air. Moderate fat containing umbilical hernia Musculoskeletal: Posterior fusion hardware at L4-L5. Chronic inferior endplate fracture at R74. Moderate severe compression fracture at L1 with about 6 mm retropulsion that results in mild canal stenosis. The appearance suggests chronic fracture but this is new since 2020. Review of the MIP images confirms the above findings. IMPRESSION: 1. Negative for acute pulmonary embolus. Cardiomegaly without acute airspace disease. 2. No CT evidence for acute intra-abdominal or pelvic abnormality. 3. Gallstones 4. Nonobstructing right kidney stone 5. 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 Moderate severe compression fracture L1, suspect chronic but is new as compared with CT from 2020 7. Diverticular disease of the colon without acute wall thickening Electronically Signed   By: Donavan Foil M.D.   On: 03/06/2021 16:28    EKG: Independently reviewed.  Sinus tachycardia rate 124.  P waves mostly present. Qtc-  382  Assessment/Plan Principal Problem:   SIRS (systemic inflammatory response syndrome) (HCC) Active Problems:   Essential hypertension, benign   COPD (chronic obstructive pulmonary disease) (HCC)   Chronic kidney disease, stage 3 unspecified (HCC)   Chronic systolic CHF (congestive heart failure) (HCC)   SIRS-tachycardic to 126, leukocytosis of 15.1.  Lactic acid of 2.3  > 2.8. At this time focus of infection not identified.  UA is not entirely convincing, with trace leukocytes and rare bacteria.  Chest CTA and abdominal CT without acute abnormality to explain SIRs. COVID and Flu negative.  -Follow-up blood and urine cultures -Continue IV ceftriaxone 2  g daily -1  L bolus given - trend lactic acid -Check procalcitonin  COPD-stable. -Resume home regimen  Chronic systolic CHF-stable and compensated.  Last echo 08/2019, with a EF of 20 to 25% with grade 3 diastolic dysfunction (restrictive). -She is on torsemide 40 mg twice daily, hold for now -Monitor volume status closely with hydration for SIRs and increasing lactic acidosis - resume entresto, spironolactone  Hypertension-stable.  CKD 3B- cr 1.33. Stable. -Monitor with contrast exposure.  Depression -Resume Cymbalta, Xanax  Hypothyroidism - Resume Synthroid   DVT prophylaxis: heparin Code Status: Full  Family Communication: None at bedside Disposition Plan: ~ /> 2 days Consults called: None Admission status: Inpt, tele I certify that at the point of admission it is my clinical judgment that the patient will require inpatient hospital care spanning beyond 2 midnights from the point of admission due to high intensity of service, high risk for further deterioration and high frequency of surveillance required.   Bethena Roys MD [facility]  03/06/2021, 9:15 PM

## 2021-03-07 ENCOUNTER — Inpatient Hospital Stay (HOSPITAL_COMMUNITY): Payer: Medicare Other

## 2021-03-07 DIAGNOSIS — R0609 Other forms of dyspnea: Secondary | ICD-10-CM | POA: Diagnosis not present

## 2021-03-07 DIAGNOSIS — I10 Essential (primary) hypertension: Secondary | ICD-10-CM

## 2021-03-07 LAB — CBC
HCT: 34.6 % — ABNORMAL LOW (ref 36.0–46.0)
HCT: 35.1 % — ABNORMAL LOW (ref 36.0–46.0)
Hemoglobin: 11.7 g/dL — ABNORMAL LOW (ref 12.0–15.0)
Hemoglobin: 11.7 g/dL — ABNORMAL LOW (ref 12.0–15.0)
MCH: 30.2 pg (ref 26.0–34.0)
MCH: 30.2 pg (ref 26.0–34.0)
MCHC: 33.3 g/dL (ref 30.0–36.0)
MCHC: 33.8 g/dL (ref 30.0–36.0)
MCV: 89.4 fL (ref 80.0–100.0)
MCV: 90.5 fL (ref 80.0–100.0)
Platelets: 228 10*3/uL (ref 150–400)
Platelets: 235 10*3/uL (ref 150–400)
RBC: 3.87 MIL/uL (ref 3.87–5.11)
RBC: 3.88 MIL/uL (ref 3.87–5.11)
RDW: 13.4 % (ref 11.5–15.5)
RDW: 13.5 % (ref 11.5–15.5)
WBC: 10.2 10*3/uL (ref 4.0–10.5)
WBC: 13.1 10*3/uL — ABNORMAL HIGH (ref 4.0–10.5)
nRBC: 0 % (ref 0.0–0.2)
nRBC: 0 % (ref 0.0–0.2)

## 2021-03-07 LAB — BASIC METABOLIC PANEL
Anion gap: 10 (ref 5–15)
BUN: 15 mg/dL (ref 8–23)
CO2: 23 mmol/L (ref 22–32)
Calcium: 8.5 mg/dL — ABNORMAL LOW (ref 8.9–10.3)
Chloride: 101 mmol/L (ref 98–111)
Creatinine, Ser: 1.31 mg/dL — ABNORMAL HIGH (ref 0.44–1.00)
GFR, Estimated: 41 mL/min — ABNORMAL LOW (ref >60)
Glucose, Bld: 152 mg/dL — ABNORMAL HIGH (ref 70–99)
Potassium: 3 mmol/L — ABNORMAL LOW (ref 3.5–5.1)
Sodium: 134 mmol/L — ABNORMAL LOW (ref 135–145)

## 2021-03-07 LAB — GLUCOSE, CAPILLARY
Glucose-Capillary: 125 mg/dL — ABNORMAL HIGH (ref 70–99)
Glucose-Capillary: 148 mg/dL — ABNORMAL HIGH (ref 70–99)
Glucose-Capillary: 152 mg/dL — ABNORMAL HIGH (ref 70–99)

## 2021-03-07 LAB — LACTIC ACID, PLASMA
Lactic Acid, Venous: 1.7 mmol/L (ref 0.5–1.9)
Lactic Acid, Venous: 2.7 mmol/L (ref 0.5–1.9)
Lactic Acid, Venous: 3.1 mmol/L (ref 0.5–1.9)

## 2021-03-07 LAB — DIFFERENTIAL
Abs Immature Granulocytes: 0.03 10*3/uL (ref 0.00–0.07)
Basophils Absolute: 0.1 10*3/uL (ref 0.0–0.1)
Basophils Relative: 1 %
Eosinophils Absolute: 0.3 10*3/uL (ref 0.0–0.5)
Eosinophils Relative: 3 %
Immature Granulocytes: 0 %
Lymphocytes Relative: 29 %
Lymphs Abs: 3 10*3/uL (ref 0.7–4.0)
Monocytes Absolute: 0.8 10*3/uL (ref 0.1–1.0)
Monocytes Relative: 8 %
Neutro Abs: 6 10*3/uL (ref 1.7–7.7)
Neutrophils Relative %: 59 %

## 2021-03-07 LAB — ECHOCARDIOGRAM COMPLETE: S' Lateral: 4.3 cm

## 2021-03-07 LAB — PROCALCITONIN: Procalcitonin: <0.1 ng/mL

## 2021-03-07 MED ORDER — SODIUM CHLORIDE 0.9 % IV BOLUS
250.0000 mL | Freq: Once | INTRAVENOUS | Status: DC
Start: 2021-03-07 — End: 2021-03-07

## 2021-03-07 MED ORDER — SODIUM CHLORIDE 0.9 % IV BOLUS
500.0000 mL | Freq: Once | INTRAVENOUS | Status: AC
  Administered 2021-03-07: 500 mL via INTRAVENOUS

## 2021-03-07 MED ORDER — POTASSIUM CHLORIDE CRYS ER 20 MEQ PO TBCR
40.0000 meq | EXTENDED_RELEASE_TABLET | Freq: Two times a day (BID) | ORAL | Status: AC
  Administered 2021-03-07 (×2): 40 meq via ORAL
  Filled 2021-03-07 (×2): qty 2

## 2021-03-07 MED ORDER — METOPROLOL TARTRATE 25 MG PO TABS
12.5000 mg | ORAL_TABLET | Freq: Two times a day (BID) | ORAL | Status: DC
  Administered 2021-03-07 – 2021-03-09 (×5): 12.5 mg via ORAL
  Filled 2021-03-07 (×5): qty 1

## 2021-03-07 NOTE — Progress Notes (Signed)
°   03/07/21 0300  Provider Notification  Provider Name/Title Dr. Josephine Cables  Date Provider Notified 03/07/21  Time Provider Notified 0145  Notification Type Page  Notification Reason Critical result  Test performed and critical result Lactic Acid 3.1  Date Critical Result Received 03/07/21  Time Critical Result Received 0010 (No notfication from lab, resulted in chart)  Provider response See new orders  Date of Provider Response 03/07/21  Time of Provider Response 0200   Night Physician notified. See new orders in chart.

## 2021-03-07 NOTE — Progress Notes (Signed)
°  Echocardiogram 2D Echocardiogram has been performed.  Johny Chess 03/07/2021, 1:34 PM

## 2021-03-07 NOTE — TOC Progression Note (Signed)
°  Transition of Care [facility]) Screening Note   Patient Details  Name: SHELBA SUSI Date of Birth: 08/27/1940   Transition of [facility]) CM/SW Contact:    Shade Flood, LCSW Phone Number: 03/07/2021, 11:23 AM    Transition of Care Department [facility]) has reviewed patient and no TOC needs have been identified at this time. We will continue to monitor patient advancement through interdisciplinary progression rounds. If new patient transition needs arise, please place a TOC consult.

## 2021-03-07 NOTE — Evaluation (Signed)
Physical Therapy Evaluation Patient Details Name: ACASIA SKILTON MRN: [id number] DOB: 19-Dec-1940 Today's Date: 03/07/2021  History of Present Illness  TIMESHA CERVANTEZ is a 81 y.o. female with medical history significant for COPD,, NICM, peptic ulcer disease, CKD 3.  Patient presented to the ED with complaints of increasing weakness.  She saw her primary care provider for same about 2 days ago, was told that her heart rate was fast when she should go to the ED. she declined and came to the ED today.  She reports chronic difficulty breathing and cough that are both unchanged.  No vomiting no loose stools.  No abdominal pain.  No pain with urination.  No headache or neck pains.   Clinical Impression  Patient functioning near baseline for functional mobility and gait demonstrating good return for transfers and ambulating in room/hallway without loss of balance, had to struggle when sitting up at bedside in hospital with [facility] raised and normally uses lift chair at home to sleep in.  Patient tolerated sitting up in chair after therapy - nursing staff notified.  Patient will benefit from continued skilled physical therapy in hospital and recommended venue below to increase strength, balance, endurance for safe ADLs and gait.          Recommendations for follow up therapy are one component of a multi-disciplinary discharge planning process, led by the attending physician.  Recommendations may be updated based on patient status, additional functional criteria and insurance authorization.  Follow Up Recommendations No PT follow up    Assistance Recommended at Discharge    Patient can return home with the following  A little help with walking and/or transfers;A little help with bathing/dressing/bathroom;Help with stairs or ramp for entrance;Assistance with cooking/housework    Equipment Recommendations None recommended by PT  Recommendations for Other Services       Functional Status Assessment Patient has  had a recent decline in their functional status and demonstrates the ability to make significant improvements in function in a reasonable and predictable amount of time.     Precautions / Restrictions Precautions Precautions: Fall Restrictions Weight Bearing Restrictions: No      Mobility  Bed Mobility Overal bed mobility: Needs Assistance Bed Mobility: Supine to Sit     Supine to sit: Supervision;HOB elevated     General bed mobility comments: slow labored movement sitting up at bedside in hospital bed, baseline sleeps in lift chair at home    Transfers Overall transfer level: Modified independent Equipment used: Rolling walker (2 wheels)               General transfer comment: good return for transferring to chair without loss of balance    Ambulation/Gait Ambulation/Gait assistance: Supervision Gait Distance (Feet): 55 Feet Assistive device: Rolling walker (2 wheels) Gait Pattern/deviations: Decreased step length - right;Decreased step length - left;Decreased stride length Gait velocity: decreased     General Gait Details: slightly labored cadence with good return for ambulaiton in room and hallway without loss of balance, limited mostly due to c/o fatigue  Stairs            Wheelchair Mobility    Modified Rankin (Stroke Patients Only)       Balance Overall balance assessment: Needs assistance Sitting-balance support: Feet supported;No upper extremity supported Sitting balance-Leahy Scale: Fair Sitting balance - Comments: fair/poor without AD, good using RW  Pertinent Vitals/Pain Pain Assessment: No/denies pain    Home Living Family/patient expects to be discharged to:: Private residence Living Arrangements: Alone Available Help at Discharge: Family;Personal care attendant;Available 24 hours/day Type of Home: Apartment Home Access: Ramped entrance       Home Layout: One level Home  Equipment: Rollator (4 wheels);Toilet riser;Hand held shower head;Shower seat;Grab bars - tub/shower      Prior Function Prior Level of Function : Needs assist       Physical Assist : Mobility (physical);ADLs (physical) Mobility (physical): Bed mobility;Transfers;Gait;Stairs   Mobility Comments: household ambulator using Rollator ADLs Comments: assisted by home aides and family 24/7     Hand Dominance        Extremity/Trunk Assessment   Upper Extremity Assessment Upper Extremity Assessment: Overall WFL for tasks assessed    Lower Extremity Assessment Lower Extremity Assessment: Generalized weakness    Cervical / Trunk Assessment Cervical / Trunk Assessment: Normal  Communication   Communication: No difficulties  Cognition Arousal/Alertness: Awake/alert Behavior During Therapy: WFL for tasks assessed/performed Overall Cognitive Status: Within Functional Limits for tasks assessed                                          General Comments      Exercises     Assessment/Plan    PT Assessment Patient needs continued PT services  PT Problem List Decreased strength;Decreased activity tolerance;Decreased balance;Decreased mobility       PT Treatment Interventions DME instruction;Gait training;Stair training;Functional mobility training;Therapeutic activities;Therapeutic exercise;Balance training;Patient/family education    PT Goals (Current goals can be found in the Care Plan section)  Acute Rehab PT Goals Patient Stated Goal: return home with home aides and family to assist PT Goal Formulation: With patient Time For Goal Achievement: 03/10/21 Potential to Achieve Goals: Good    Frequency Min 2X/week     Co-evaluation               AM-PAC PT "6 Clicks" Mobility  Outcome Measure Help needed turning from your back to your side while in a flat bed without using bedrails?: None Help needed moving from lying on your back to sitting on the  side of a flat bed without using bedrails?: A Little Help needed moving to and from a bed to a chair (including a wheelchair)?: None Help needed standing up from a chair using your arms (e.g., wheelchair or bedside chair)?: None Help needed to walk in hospital room?: A Little Help needed climbing 3-5 steps with a railing? : A Lot 6 Click Score: 20    End of Session   Activity Tolerance: Patient tolerated treatment well;Patient limited by fatigue Patient left: in chair;with call bell/phone within reach Nurse Communication: Mobility status PT Visit Diagnosis: Unsteadiness on feet (R26.81);Other abnormalities of gait and mobility (R26.89);Muscle weakness (generalized) (M62.81)    Time: 0814-4818 PT Time Calculation (min) (ACUTE ONLY): 20 min   Charges:   PT Evaluation $PT Eval Moderate Complexity: 1 Mod PT Treatments $Therapeutic Activity: 8-22 mins        2:13 PM, 03/07/21 Lonell Grandchild, MPT Physical Therapist with [facility] [phone] office [phone] mobile phone

## 2021-03-07 NOTE — Progress Notes (Signed)
[facility] PROGRESS NOTE    Progress Note  Sheila [name]  MVH:[id number] DOB: 10-Nov-1940 DOA: 03/06/2021 PCP: Sharion Balloon, FNP     Brief Narrative:   Sheila [name] is an 81 y.o. female past medical history significant for COPD, nonischemic cardiomyopathy with last EF in 2021 with 20 to 25% and grade 3 diastolic heart failure peptic ulcer disease and chronic kidney disease comes in complaining of weakness, in the ED she was found to be tachycardic, mild leukocytosis elevated lactic acid imaging showed no abnormalities SARS-CoV-2 PCR were negative.  Blood cultures and urine cultures were sent and she was started on IV Rocephin and fluid resuscitated.   Assessment/Plan:   SIRS (systemic inflammatory response syndrome) ([place]): She meets SIRS criteria imaging showed no source. U/A showed 11-20 white blood cells negative nitrates urine cultures and blood cultures were sent. She was fluid resuscitated started empirically on antibiotics Rocephin. T-max of 98.5, with a mild leukocytosis of 15 and left shift, it has improved since admission.  Procalcitonin less than 0.1 Lactic acid is trending up again is 2.7 we will go ahead and give her an additional liter bolus fluid hold torsemide and Entresto. Check a 2D echo  Acute kidney injury on chronic kidney disease stage IIIa: With a baseline creatinine of less than 1 on admission 1.3. Will hold diuretic therapy and Entresto we will give her a bolus of normal saline.  Chronic systolic heart failure/nonischemic cardiomyopathy: She appears to be compensated her last EF was in 2021 showed an ejection fraction of 84% grade 3 diastolic heart failure. Due to her acute kidney injury we will hold Entresto and diuretic therapy torsemide. I am not sure why she is not on a beta-blocker with this low EF, we will go ahead and start her on low-dose beta-blocker.  Sinus tachycardia: She has a EF of 25%: On her beta-blocker she was started on Coreg and  digoxin which she did not tolerate, we will go ahead and start her on low-dose metoprolol  Essential hypertension, benign Blood pressure stable we will hold torsemide Entresto and Aldactone.  Depression: Continue Cymbalta and Xanax.  Hypothyroidism: Continue Synthroid.  COPD (chronic obstructive pulmonary disease) (HCC) Stable    DVT prophylaxis: lovenox Family Communication:none Status is: Inpatient  Remains inpatient appropriate because: SIRS and acute kidney injury        Code Status:     Code Status Orders  (From admission, onward)           Start     Ordered   03/06/21 2111  Full code  Continuous        03/06/21 2111           Code Status History     Date Active Date Inactive Code Status Order ID Comments User Context   10/02/2019 1413 10/06/2019 0057 Full Code [id number]  Lorretta Harp, MD Inpatient   08/24/2016 1740 08/25/2016 1450 Full Code [id number]  Newman Pies, MD Inpatient   08/01/2013 1405 08/02/2013 1332 Full Code [id number]  Jamesetta So, MD Inpatient   07/31/2013 1809 08/01/2013 1405 Full Code [id number]  Jamesetta So, MD Inpatient   01/13/2011 1811 01/15/2011 1846 Full Code [id number]  Rexene Alberts, MD ED         IV Access:   Peripheral IV   Procedures and diagnostic studies:   DG Chest 2 View  Result Date: 03/06/2021 CLINICAL DATA:  Tachycardia.  Increased weakness. EXAM: CHEST - 2 VIEW COMPARISON:  06/28/2020 FINDINGS:  Normal sized heart. Tortuous aorta. Clear lungs with normal vascularity. No acute bony abnormality. Lumbar spine fixation hardware. IMPRESSION: No acute abnormality. Electronically Signed   By: Claudie Revering M.D.   On: 03/06/2021 11:53   CT Angio Chest PE W and/or Wo Contrast  Result Date: 03/06/2021 CLINICAL DATA:  Left lower quadrant pain tachycardia weakness EXAM: CT ANGIOGRAPHY CHEST CT ABDOMEN AND PELVIS WITH CONTRAST TECHNIQUE: Multidetector CT imaging of the chest was performed using the standard protocol  during bolus administration of intravenous contrast. Multiplanar CT image reconstructions and MIPs were obtained to evaluate the vascular anatomy. Multidetector CT imaging of the abdomen and pelvis was performed using the standard protocol during bolus administration of intravenous contrast. RADIATION DOSE REDUCTION: This exam was performed according to the departmental dose-optimization program which includes automated exposure control, adjustment of the mA and/or kV according to patient size and/or use of iterative reconstruction technique. CONTRAST:  4mL OMNIPAQUE IOHEXOL 350 MG/ML SOLN COMPARISON:  Chest x-ray 03/06/2021, CT 08/29/2018 FINDINGS: CTA CHEST FINDINGS Cardiovascular: Satisfactory opacification of the pulmonary arteries to the segmental level. No evidence of pulmonary embolism. Mild motion degradation limits the exam. Cardiomegaly. No pericardial effusion. Mild atherosclerosis. No aneurysm or dissection. 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 Coronary vascular calcification. Mediastinum/Nodes: Midline trachea. No thyroid mass. No suspicious lymph nodes. Esophagus within normal limits. Lungs/Pleura: No consolidation, pleural effusion, or pneumothorax. Mild bandlike scarring within the subpleural lower lobes. Musculoskeletal: Sternum is intact. Chronic inferior endplate fracture at C14. Review of the MIP images confirms the above findings. CT ABDOMEN and PELVIS FINDINGS Hepatobiliary: Gallstone. No biliary dilatation. No focal hepatic abnormality Pancreas: Unremarkable. No pancreatic ductal dilatation or surrounding inflammatory changes. Spleen: Normal in size without focal abnormality. Adrenals/Urinary Tract: Adrenal glands are normal. Nonobstructing stone in the lower pole of the right kidney measuring 9 mm. Subcentimeter hypodensities in the left kidney too small to further characterize. The bladder is normal Stomach/Bowel:  The stomach is nonenlarged. No dilated small bowel. Diverticular disease of the colon without acute inflammatory process. Status post appendectomy. Vascular/Lymphatic: Moderate severe aortic atherosclerosis without aneurysm. No suspicious lymph nodes Reproductive: Uterus and bilateral adnexa are unremarkable. Other: Negative for pelvic effusion or free air. Moderate fat containing umbilical hernia Musculoskeletal: Posterior fusion hardware at L4-L5. Chronic inferior endplate fracture at G81. Moderate severe compression fracture at L1 with about 6 mm retropulsion that results in mild canal stenosis. The appearance suggests chronic fracture but this is new since 2020. Review of the MIP images confirms the above findings. IMPRESSION: 1. Negative for acute pulmonary embolus. Cardiomegaly without acute airspace disease. 2. No CT evidence for acute intra-abdominal or pelvic abnormality. 3. Gallstones 4. Nonobstructing right kidney stone 5. 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 Moderate severe compression fracture L1, suspect chronic but is new as compared with CT from 2020 7. Diverticular disease of the colon without acute wall thickening Electronically Signed   By: Donavan Foil M.D.   On: 03/06/2021 16:28   CT ABDOMEN PELVIS W CONTRAST  Result Date: 03/06/2021 CLINICAL DATA:  Left lower quadrant pain tachycardia weakness EXAM: CT ANGIOGRAPHY CHEST CT ABDOMEN AND PELVIS WITH CONTRAST TECHNIQUE: Multidetector CT imaging of the chest was performed using the standard protocol during bolus administration of intravenous contrast. Multiplanar CT image reconstructions and MIPs were obtained to evaluate the vascular anatomy. Multidetector CT imaging of the abdomen and pelvis was performed using the standard protocol during bolus administration of intravenous  contrast. RADIATION DOSE REDUCTION: This exam was performed according to the departmental dose-optimization program which includes automated  exposure control, adjustment of the mA and/or kV according to patient size and/or use of iterative reconstruction technique. CONTRAST:  20mL OMNIPAQUE IOHEXOL 350 MG/ML SOLN COMPARISON:  Chest x-ray 03/06/2021, CT 08/29/2018 FINDINGS: CTA CHEST FINDINGS Cardiovascular: Satisfactory opacification of the pulmonary arteries to the segmental level. No evidence of pulmonary embolism. Mild motion degradation limits the exam. Cardiomegaly. No pericardial effusion. Mild atherosclerosis. No aneurysm or dissection. 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 Coronary vascular calcification. Mediastinum/Nodes: Midline trachea. No thyroid mass. No suspicious lymph nodes. Esophagus within normal limits. Lungs/Pleura: No consolidation, pleural effusion, or pneumothorax. Mild bandlike scarring within the subpleural lower lobes. Musculoskeletal: Sternum is intact. Chronic inferior endplate fracture at O17. Review of the MIP images confirms the above findings. CT ABDOMEN and PELVIS FINDINGS Hepatobiliary: Gallstone. No biliary dilatation. No focal hepatic abnormality Pancreas: Unremarkable. No pancreatic ductal dilatation or surrounding inflammatory changes. Spleen: Normal in size without focal abnormality. Adrenals/Urinary Tract: Adrenal glands are normal. Nonobstructing stone in the lower pole of the right kidney measuring 9 mm. Subcentimeter hypodensities in the left kidney too small to further characterize. The bladder is normal Stomach/Bowel: The stomach is nonenlarged. No dilated small bowel. Diverticular disease of the colon without acute inflammatory process. Status post appendectomy. Vascular/Lymphatic: Moderate severe aortic atherosclerosis without aneurysm. No suspicious lymph nodes Reproductive: Uterus and bilateral adnexa are unremarkable. Other: Negative for pelvic effusion or free air. Moderate fat containing umbilical hernia Musculoskeletal:  Posterior fusion hardware at L4-L5. Chronic inferior endplate fracture at P10. Moderate severe compression fracture at L1 with about 6 mm retropulsion that results in mild canal stenosis. The appearance suggests chronic fracture but this is new since 2020. Review of the MIP images confirms the above findings. IMPRESSION: 1. Negative for acute pulmonary embolus. Cardiomegaly without acute airspace disease. 2. No CT evidence for acute intra-abdominal or pelvic abnormality. 3. Gallstones 4. Nonobstructing right kidney stone 5. 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 Moderate severe compression fracture L1, suspect chronic but is new as compared with CT from 2020 7. Diverticular disease of the colon without acute wall thickening Electronically Signed   By: Donavan Foil M.D.   On: 03/06/2021 16:28     Medical Consultants:   None.   Subjective:    [name] she relates she can still feel her heart beating fast  Objective:    Vitals:   03/06/21 2111 03/06/21 2149 03/06/21 2155 03/07/21 0453  BP:  (!) 148/88  (!) 141/65  Pulse:  (!) 102  (!) 105  Resp:  20  19  Temp:  97.7 F (36.5 C)  98.2 F (36.8 C)  TempSrc:  Oral    SpO2: 93% 92% 93% 95%   SpO2: 95 % O2 Flow Rate (L/min): 0 L/min FiO2 (%): 21 %   Intake/Output Summary (Last 24 hours) at 03/07/2021 2585 Last data filed at 03/07/2021 0247 Gross per 24 hour  Intake 1100 ml  Output --  Net 1100 ml   There were no vitals filed for this visit.  Exam: General exam: In no acute distress. Respiratory system: Good air movement and clear to auscultation. Cardiovascular system: S1 & S2 heard, RRR.  Cannot appreciate any JVD Gastrointestinal system: Abdomen is nondistended, soft and nontender.  Extremities: No lower extremity edema Skin: No rashes, lesions or ulcers Psychiatry: Judgement and insight appear normal.  Mood & affect appropriate.    Data Reviewed:    Labs: Basic Metabolic Panel: Recent Labs   Lab 03/06/21 1142 03/07/21 0011  NA 134* 134*  K 4.2 3.0*  CL 97* 101  CO2 23 23  GLUCOSE 134* 152*  BUN 16 15  CREATININE 1.33* 1.31*  CALCIUM 9.9 8.5*   GFR Estimated Creatinine Clearance: 37.5 mL/min (A) (by C-G formula based on SCr of 1.31 mg/dL (H)). Liver Function Tests: Recent Labs  Lab 03/06/21 1334  AST 39  ALT 29  ALKPHOS 50  BILITOT 0.8  PROT 7.7  ALBUMIN 4.1   No results for input(s): LIPASE, AMYLASE in the last 168 hours. No results for input(s): AMMONIA in the last 168 hours. Coagulation profile No results for input(s): INR, PROTIME in the last 168 hours. COVID-19 Labs  No results for input(s): DDIMER, FERRITIN, LDH, CRP in the last 72 hours.  Lab Results  Component Value Date   SARSCOV2NAA NEGATIVE 03/06/2021   [place] Not Detected 02/18/2021   SARSCOV2NAA Detected (A) 10/21/2020   [place] NEGATIVE 09/29/2019    CBC: Recent Labs  Lab 03/06/21 1142 03/07/21 0011  WBC 15.1* 13.1*  HGB 14.4 11.7*  HCT 42.8 34.6*  MCV 89.5 89.4  PLT 332 235   Cardiac Enzymes: No results for input(s): CKTOTAL, CKMB, CKMBINDEX, TROPONINI in the last 168 hours. BNP (last 3 results) No results for input(s): PROBNP in the last 8760 hours. CBG: Recent Labs  Lab 03/07/21 0016 03/07/21 0413 03/07/21 0710  GLUCAP 152* 148* 125*   D-Dimer: No results for input(s): DDIMER in the last 72 hours. Hgb A1c: No results for input(s): HGBA1C in the last 72 hours. Lipid Profile: No results for input(s): CHOL, HDL, LDLCALC, TRIG, CHOLHDL, LDLDIRECT in the last 72 hours. Thyroid function studies: Recent Labs    03/06/21 1659  TSH 0.803   Anemia work up: No results for input(s): VITAMINB12, FOLATE, FERRITIN, TIBC, IRON, RETICCTPCT in the last 72 hours. Sepsis Labs: Recent Labs  Lab 03/06/21 1142 03/06/21 1658 03/06/21 1916 03/07/21 0010 03/07/21 0011 03/07/21 0338 03/07/21 0602  PROCALCITON  --   --   --  <0.10  --   --   --   WBC 15.1*  --   --    --  13.1*  --   --   LATICACIDVEN  --    < > 2.8* 3.1*  --  1.7 2.7*   < > = values in this interval not displayed.   Microbiology Recent Results (from the past 240 hour(s))  Resp Panel by RT-PCR (Flu A&B, Covid) Nasopharyngeal Swab     Status: None   Collection Time: 03/06/21  1:53 PM   Specimen: Nasopharyngeal Swab; Nasopharyngeal(NP) swabs in vial transport medium  Result Value Ref Range Status   SARS Coronavirus 2 by RT PCR NEGATIVE NEGATIVE Final    Comment: (NOTE) SARS-CoV-2 target nucleic acids are NOT DETECTED.  The SARS-CoV-2 RNA is generally detectable in upper respiratory specimens during the acute phase of infection. The lowest concentration of SARS-CoV-2 viral copies this assay can detect is 138 copies/mL. A negative result does not preclude SARS-Cov-2 infection and should not be used as the sole basis for treatment or other patient management decisions. A negative result may occur with  improper specimen collection/handling, submission of specimen other than nasopharyngeal swab, presence of viral mutation(s) within the areas targeted by this assay, and inadequate number of viral copies(<138 copies/mL). A negative result must be combined with clinical observations, patient  history, and epidemiological information. The expected result is Negative.  Fact Sheet for Patients:  EntrepreneurPulse.com.au  Fact Sheet for Healthcare Providers:  IncredibleEmployment.be  This test is no t yet approved or cleared by the [place] FDA and  has been authorized for detection and/or diagnosis of SARS-CoV-2 by FDA under an Emergency Use Authorization (EUA). This EUA will remain  in effect (meaning this test can be used) for the duration of the COVID-19 declaration under Section 564(b)(1) of the Act, 21 U.S.C.section 360bbb-3(b)(1), unless the authorization is terminated  or revoked sooner.       Influenza A by PCR NEGATIVE NEGATIVE Final    Influenza B by PCR NEGATIVE NEGATIVE Final    Comment: (NOTE) The Xpert Xpress SARS-CoV-2/FLU/RSV plus assay is intended as an aid in the diagnosis of influenza from Nasopharyngeal swab specimens and should not be used as a sole basis for treatment. Nasal washings and aspirates are unacceptable for Xpert Xpress SARS-CoV-2/FLU/RSV testing.  Fact Sheet for Patients: EntrepreneurPulse.com.au  Fact Sheet for Healthcare Providers: IncredibleEmployment.be  This test is not yet approved or cleared by the [place] FDA and has been authorized for detection and/or diagnosis of SARS-CoV-2 by FDA under an Emergency Use Authorization (EUA). This EUA will remain in effect (meaning this test can be used) for the duration of the COVID-19 declaration under Section 564(b)(1) of the Act, 21 U.S.C. section 360bbb-3(b)(1), unless the authorization is terminated or revoked.  Performed at [facility], [address], [place], [place] [address]   Blood culture (routine x 2)     Status: None (Preliminary result)   Collection Time: 03/06/21  4:58 PM   Specimen: BLOOD LEFT HAND  Result Value Ref Range Status   Specimen Description BLOOD LEFT HAND  Final   Special Requests   Final    BOTTLES DRAWN AEROBIC AND ANAEROBIC Blood Culture adequate volume   Culture   Final    NO GROWTH < 12 HOURS Performed at [facility], [address], [place], [place] [address]    Report Status PENDING  Incomplete  Blood culture (routine x 2)     Status: None (Preliminary result)   Collection Time: 03/06/21  5:03 PM   Specimen: BLOOD RIGHT WRIST  Result Value Ref Range Status   Specimen Description BLOOD RIGHT WRIST  Final   Special Requests   Final    BOTTLES DRAWN AEROBIC AND ANAEROBIC Blood Culture results may not be optimal due to an inadequate volume of blood received in culture bottles   Culture   Final    NO GROWTH < 12 HOURS Performed at [facility], [address], [place], [place] [address]    Report Status PENDING  Incomplete     Medications:    DULoxetine  60 mg Oral Daily   gabapentin  300 mg Oral TID   heparin  5,000 Units Subcutaneous Q8H   levothyroxine  88 mcg Oral Daily   loratadine  10 mg Oral QHS   mometasone-formoterol  2 puff Inhalation BID   pantoprazole  20 mg Oral QHS   sacubitril-valsartan  1 tablet Oral BID   spironolactone  25 mg Oral Daily   Continuous Infusions:  cefTRIAXone (ROCEPHIN)  IV        LOS: 1 day   Charlynne Cousins  [facility]  03/07/2021, 7:23 AM

## 2021-03-07 NOTE — Progress Notes (Signed)
Date and time results received: 03/07/21 0728  Test: lactic acid Critical Value: 2.7  Name of Provider Notified: A. Olevia Bowens, MD  N.O to start 500 bolus of NS

## 2021-03-07 NOTE — Plan of Care (Signed)
°  Problem: Acute Rehab PT Goals(only PT should resolve) Goal: Pt Will Go Supine/Side To Sit Outcome: Progressing Flowsheets (Taken 03/07/2021 1414) Pt will go Supine/Side to Sit: with modified independence Goal: Patient Will Transfer Sit To/From Stand Outcome: Progressing Flowsheets (Taken 03/07/2021 1414) Patient will transfer sit to/from stand: with modified independence Goal: Pt Will Transfer Bed To Chair/Chair To Bed Outcome: Progressing Flowsheets (Taken 03/07/2021 1414) Pt will Transfer Bed to Chair/Chair to Bed: with modified independence Goal: Pt Will Ambulate Outcome: Progressing Flowsheets (Taken 03/07/2021 1414) Pt will Ambulate:  75 feet  with modified independence  with rolling walker   2:15 PM, 03/07/21 Lonell Grandchild, MPT Physical Therapist with [facility] [phone] office [phone] mobile phone

## 2021-03-07 NOTE — Care Management Important Message (Signed)
Important Message  Patient Details  Name: Sheila [name] MRN: [id number] Date of Birth: 04/03/1940   Medicare Important Message Given:  Yes     Tommy Medal 03/07/2021, 4:25 PM

## 2021-03-08 ENCOUNTER — Other Ambulatory Visit: Payer: Self-pay

## 2021-03-08 ENCOUNTER — Encounter (HOSPITAL_COMMUNITY): Payer: Self-pay | Admitting: Internal Medicine

## 2021-03-08 LAB — BASIC METABOLIC PANEL
Anion gap: 9 (ref 5–15)
BUN: 10 mg/dL (ref 8–23)
CO2: 26 mmol/L (ref 22–32)
Calcium: 9.1 mg/dL (ref 8.9–10.3)
Chloride: 105 mmol/L (ref 98–111)
Creatinine, Ser: 1.17 mg/dL — ABNORMAL HIGH (ref 0.44–1.00)
GFR, Estimated: 47 mL/min — ABNORMAL LOW (ref >60)
Glucose, Bld: 126 mg/dL — ABNORMAL HIGH (ref 70–99)
Potassium: 4 mmol/L (ref 3.5–5.1)
Sodium: 140 mmol/L (ref 135–145)

## 2021-03-08 LAB — URINE CULTURE

## 2021-03-08 MED ORDER — SACUBITRIL-VALSARTAN 49-51 MG PO TABS
1.0000 | ORAL_TABLET | Freq: Two times a day (BID) | ORAL | Status: DC
  Administered 2021-03-08 – 2021-03-09 (×3): 1 via ORAL
  Filled 2021-03-08 (×3): qty 1

## 2021-03-08 NOTE — Progress Notes (Signed)
[facility] PROGRESS NOTE    Progress Note  Sheila [name]  IRS:[id number] DOB: [birth year] DOA: 03/06/2021 PCP: Sharion Balloon, FNP     Brief Narrative:   Sheila [name] is an 81 y.o. female past medical history significant for COPD, nonischemic cardiomyopathy with last EF in 2021 with 20 to 25% and grade 3 diastolic heart failure peptic ulcer disease and chronic kidney disease comes in complaining of weakness, in the ED she was found to be tachycardic, mild leukocytosis elevated lactic acid imaging showed no abnormalities SARS-CoV-2 PCR were negative.  Blood cultures and urine cultures were sent and she was started on IV Rocephin and fluid resuscitated.   Assessment/Plan:   SIRS (systemic inflammatory response syndrome) ([place]): She meets SIRS criteria imaging showed no source. Urine cultures and blood cultures were sent results are pending.  Concern about urinary tract infection source. Continue IV Rocephin. T-max of 98.2 leukocytosis is resolved, procalcitonin low yield lactic acidosis resolved. 2D echo was done that showed an improved EF now 45% to 50% no wall motion abnormalities. Discontinue cardiac monitoring. Physical therapy evaluated the patient, no home health PT   Acute kidney injury on chronic kidney disease stage IIIa: With a baseline creatinine of less than 1 on admission 1.3. Diuretics were held along with Delene Loll she was given normal saline her creatinine has improved to baseline.  Chronic systolic heart failure/nonischemic cardiomyopathy: Repeated 2D echo showed an EF of 45 to 50%. Diuretics and Entresto were held on admission. The blood pressure is trending up we will go ahead and start her back on her Entresto continue Aldactone check a basic metabolic panel in the morning On admission she was previously not on beta-blocker, there was documentation in the chart that when she was intolerant, she was started on low-dose metoprolol and her heart rate has  improved.  Sinus tachycardia: Previous EF of 25% she was not on a beta-blocker, her EF now is 45 to 50%. She was started on a beta-blocker her blood pressure is tolerated her heart rate has improved.    Essential hypertension, benign Blood pressure is trending up resume Entresto, continue Aldactone and metoprolol.   Check a basic metabolic panel tomorrow morning.  Depression: Continue Cymbalta and Xanax.  Hypothyroidism: Continue Synthroid.  COPD (chronic obstructive pulmonary disease) (HCC) Stable    DVT prophylaxis: lovenox Family Communication:none Status is: Inpatient  Remains inpatient appropriate because: SIRS and acute kidney injury        Code Status:     Code Status Orders  (From admission, onward)           Start     Ordered   03/06/21 2111  Full code  Continuous        03/06/21 2111           Code Status History     Date Active Date Inactive Code Status Order ID Comments User Context   10/02/2019 1413 10/06/2019 0057 Full Code [id number]  Lorretta Harp, MD Inpatient   08/24/2016 1740 08/25/2016 1450 Full Code [id number]  Newman Pies, MD Inpatient   08/01/2013 1405 08/02/2013 1332 Full Code [id number]  Jamesetta So, MD Inpatient   07/31/2013 1809 08/01/2013 1405 Full Code [id number]  Jamesetta So, MD Inpatient   01/13/2011 1811 01/15/2011 1846 Full Code [id number]  Rexene Alberts, MD ED         IV Access:   Peripheral IV   Procedures and diagnostic studies:   DG Chest 2 View  Result  Date: 03/06/2021 CLINICAL DATA:  Tachycardia.  Increased weakness. EXAM: CHEST - 2 VIEW COMPARISON:  06/28/2020 FINDINGS: Normal sized heart. Tortuous aorta. Clear lungs with normal vascularity. No acute bony abnormality. Lumbar spine fixation hardware. IMPRESSION: No acute abnormality. Electronically Signed   By: Claudie Revering M.D.   On: 03/06/2021 11:53   CT Angio Chest PE W and/or Wo Contrast  Result Date: 03/06/2021 CLINICAL DATA:  Left lower quadrant  pain tachycardia weakness EXAM: CT ANGIOGRAPHY CHEST CT ABDOMEN AND PELVIS WITH CONTRAST TECHNIQUE: Multidetector CT imaging of the chest was performed using the standard protocol during bolus administration of intravenous contrast. Multiplanar CT image reconstructions and MIPs were obtained to evaluate the vascular anatomy. Multidetector CT imaging of the abdomen and pelvis was performed using the standard protocol during bolus administration of intravenous contrast. RADIATION DOSE REDUCTION: This exam was performed according to the departmental dose-optimization program which includes automated exposure control, adjustment of the mA and/or kV according to patient size and/or use of iterative reconstruction technique. CONTRAST:  67mL OMNIPAQUE IOHEXOL 350 MG/ML SOLN COMPARISON:  Chest x-ray 03/06/2021, CT 08/29/2018 FINDINGS: CTA CHEST FINDINGS Cardiovascular: Satisfactory opacification of the pulmonary arteries to the segmental level. No evidence of pulmonary embolism. Mild motion degradation limits the exam. Cardiomegaly. No pericardial effusion. Mild atherosclerosis. No aneurysm or dissection. 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 Coronary vascular calcification. Mediastinum/Nodes: Midline trachea. No thyroid mass. No suspicious lymph nodes. Esophagus within normal limits. Lungs/Pleura: No consolidation, pleural effusion, or pneumothorax. Mild bandlike scarring within the subpleural lower lobes. Musculoskeletal: Sternum is intact. Chronic inferior endplate fracture at O29. Review of the MIP images confirms the above findings. CT ABDOMEN and PELVIS FINDINGS Hepatobiliary: Gallstone. No biliary dilatation. No focal hepatic abnormality Pancreas: Unremarkable. No pancreatic ductal dilatation or surrounding inflammatory changes. Spleen: Normal in size without focal abnormality. Adrenals/Urinary Tract: Adrenal glands are normal. Nonobstructing  stone in the lower pole of the right kidney measuring 9 mm. Subcentimeter hypodensities in the left kidney too small to further characterize. The bladder is normal Stomach/Bowel: The stomach is nonenlarged. No dilated small bowel. Diverticular disease of the colon without acute inflammatory process. Status post appendectomy. Vascular/Lymphatic: Moderate severe aortic atherosclerosis without aneurysm. No suspicious lymph nodes Reproductive: Uterus and bilateral adnexa are unremarkable. Other: Negative for pelvic effusion or free air. Moderate fat containing umbilical hernia Musculoskeletal: Posterior fusion hardware at L4-L5. Chronic inferior endplate fracture at U76. Moderate severe compression fracture at L1 with about 6 mm retropulsion that results in mild canal stenosis. The appearance suggests chronic fracture but this is new since 2020. Review of the MIP images confirms the above findings. IMPRESSION: 1. Negative for acute pulmonary embolus. Cardiomegaly without acute airspace disease. 2. No CT evidence for acute intra-abdominal or pelvic abnormality. 3. Gallstones 4. Nonobstructing right kidney stone 5. 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 Moderate severe compression fracture L1, suspect chronic but is new as compared with CT from 2020 7. Diverticular disease of the colon without acute wall thickening Electronically Signed   By: Donavan Foil M.D.   On: 03/06/2021 16:28   CT ABDOMEN PELVIS W CONTRAST  Result Date: 03/06/2021 CLINICAL DATA:  Left lower quadrant pain tachycardia weakness EXAM: CT ANGIOGRAPHY CHEST CT ABDOMEN AND PELVIS WITH CONTRAST TECHNIQUE: Multidetector CT imaging of the chest was performed using the standard protocol during bolus administration of intravenous contrast. Multiplanar CT image reconstructions and MIPs were obtained to evaluate the vascular anatomy. Multidetector  CT imaging of the abdomen and pelvis was performed using the standard protocol during  bolus administration of intravenous contrast. RADIATION DOSE REDUCTION: This exam was performed according to the departmental dose-optimization program which includes automated exposure control, adjustment of the mA and/or kV according to patient size and/or use of iterative reconstruction technique. CONTRAST:  56mL OMNIPAQUE IOHEXOL 350 MG/ML SOLN COMPARISON:  Chest x-ray 03/06/2021, CT 08/29/2018 FINDINGS: CTA CHEST FINDINGS Cardiovascular: Satisfactory opacification of the pulmonary arteries to the segmental level. No evidence of pulmonary embolism. Mild motion degradation limits the exam. Cardiomegaly. No pericardial effusion. Mild atherosclerosis. No aneurysm or dissection. 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 Coronary vascular calcification. Mediastinum/Nodes: Midline trachea. No thyroid mass. No suspicious lymph nodes. Esophagus within normal limits. Lungs/Pleura: No consolidation, pleural effusion, or pneumothorax. Mild bandlike scarring within the subpleural lower lobes. Musculoskeletal: Sternum is intact. Chronic inferior endplate fracture at N82. Review of the MIP images confirms the above findings. CT ABDOMEN and PELVIS FINDINGS Hepatobiliary: Gallstone. No biliary dilatation. No focal hepatic abnormality Pancreas: Unremarkable. No pancreatic ductal dilatation or surrounding inflammatory changes. Spleen: Normal in size without focal abnormality. Adrenals/Urinary Tract: Adrenal glands are normal. Nonobstructing stone in the lower pole of the right kidney measuring 9 mm. Subcentimeter hypodensities in the left kidney too small to further characterize. The bladder is normal Stomach/Bowel: The stomach is nonenlarged. No dilated small bowel. Diverticular disease of the colon without acute inflammatory process. Status post appendectomy. Vascular/Lymphatic: Moderate severe aortic atherosclerosis without aneurysm. No suspicious lymph  nodes Reproductive: Uterus and bilateral adnexa are unremarkable. Other: Negative for pelvic effusion or free air. Moderate fat containing umbilical hernia Musculoskeletal: Posterior fusion hardware at L4-L5. Chronic inferior endplate fracture at N56. Moderate severe compression fracture at L1 with about 6 mm retropulsion that results in mild canal stenosis. The appearance suggests chronic fracture but this is new since 2020. Review of the MIP images confirms the above findings. IMPRESSION: 1. Negative for acute pulmonary embolus. Cardiomegaly without acute airspace disease. 2. No CT evidence for acute intra-abdominal or pelvic abnormality. 3. Gallstones 4. Nonobstructing right kidney stone 5. 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 Moderate severe compression fracture L1, suspect chronic but is new as compared with CT from 2020 7. Diverticular disease of the colon without acute wall thickening Electronically Signed   By: Donavan Foil M.D.   On: 03/06/2021 16:28   ECHOCARDIOGRAM COMPLETE  Result Date: 03/07/2021    ECHOCARDIOGRAM REPORT   Patient Name:   JADEYN HARGETT Date of Exam: 03/07/2021 Medical Rec #:  [id number]    Height:       59.0 in Accession #:    [id number]   Weight:       233.0 lb Date of Birth:  May 27, 1940    BSA:          1.968 m Patient Age:    61 years     BP:           141/65 mmHg Patient Gender: F            HR:           98 bpm. Exam Location:  [facility] Procedure: 2D Echo Indications:    dyspnea  History:        Patient has prior history of Echocardiogram examinations, most                 recent 09/20/2019. CHF, COPD and chronic  kidney disease; Risk                 Factors:Former Smoker, Hypertension and Dyslipidemia.  Sonographer:    Johny Chess RDCS Referring Phys: [phone] [facility]  Sonographer Comments: Patient is morbidly obese. Image acquisition challenging due to patient body habitus. IMPRESSIONS  1. Left ventricular ejection fraction, by  estimation, is 45 to 50%. The left ventricle has mildly decreased function. Left ventricular endocardial border not optimally defined to evaluate regional wall motion. Left ventricular diastolic parameters are indeterminate.  2. Right ventricular systolic function is normal. The right ventricular size is normal. There is normal pulmonary artery systolic pressure. The estimated right ventricular systolic pressure is 18.8 mmHg.  3. The mitral valve is grossly normal. Trivial mitral valve regurgitation.  4. The aortic valve is tricuspid. Aortic valve regurgitation is not visualized. Aortic valve sclerosis/calcification is present, without any evidence of aortic stenosis.  5. Aortic dilatation noted. There is mild dilatation of the ascending aorta, measuring 39 mm.  6. The inferior vena cava is normal in size with greater than 50% respiratory variability, suggesting right atrial pressure of 3 mmHg. Comparison(s): Prior images reviewed side by side. There has been significant improvement in LVEF in comparison with prior study. FINDINGS  Left Ventricle: Left ventricular ejection fraction, by estimation, is 45 to 50%. The left ventricle has mildly decreased function. Left ventricular endocardial border not optimally defined to evaluate regional wall motion. The left ventricular internal cavity size was normal in size. There is no left ventricular hypertrophy. Left ventricular diastolic parameters are indeterminate. Right Ventricle: The right ventricular size is normal. No increase in right ventricular wall thickness. Right ventricular systolic function is normal. There is normal pulmonary artery systolic pressure. The tricuspid regurgitant velocity is 2.12 m/s, and  with an assumed right atrial pressure of 3 mmHg, the estimated right ventricular systolic pressure is 41.6 mmHg. Left Atrium: Left atrial size was normal in size. Right Atrium: Right atrial size was normal in size. Pericardium: There is no evidence of  pericardial effusion. Presence of epicardial fat layer. Mitral Valve: The mitral valve is grossly normal. Trivial mitral valve regurgitation. Tricuspid Valve: The tricuspid valve is grossly normal. Tricuspid valve regurgitation is mild. Aortic Valve: The aortic valve is tricuspid. There is mild to moderate aortic valve annular calcification. Aortic valve regurgitation is not visualized. Aortic valve sclerosis/calcification is present, without any evidence of aortic stenosis. Pulmonic Valve: The pulmonic valve was grossly normal. Pulmonic valve regurgitation is trivial. Aorta: The aortic root is normal in size and structure and aortic dilatation noted. There is mild dilatation of the ascending aorta, measuring 39 mm. Venous: The inferior vena cava is normal in size with greater than 50% respiratory variability, suggesting right atrial pressure of 3 mmHg. IAS/Shunts: No atrial level shunt detected by color flow Doppler.  LEFT VENTRICLE PLAX 2D LVIDd:         5.10 cm LVIDs:         4.30 cm LV PW:         1.00 cm LV IVS:        1.00 cm LVOT diam:     1.90 cm LVOT Area:     2.84 cm  RIGHT VENTRICLE             IVC RV S prime:     14.90 cm/s  IVC diam: 1.10 cm LEFT ATRIUM             Index  RIGHT ATRIUM           Index LA diam:        3.10 cm 1.58 cm/m   RA Area:     13.80 cm LA Vol (A2C):   52.1 ml 26.47 ml/m  RA Volume:   33.80 ml  17.17 ml/m LA Vol (A4C):   44.3 ml 22.51 ml/m LA Biplane Vol: 48.4 ml 24.59 ml/m   AORTA Ao Root diam: 2.90 cm Ao Asc diam:  3.90 cm TRICUSPID VALVE TR Peak grad:   18.0 mmHg TR Vmax:        212.00 cm/s  SHUNTS Systemic Diam: 1.90 cm Rozann Lesches MD Electronically signed by Rozann Lesches MD Signature Date/Time: 03/07/2021/1:53:15 PM    Final      Medical Consultants:   None.   Subjective:    [name] relates she feels better want to go home today.  Objective:    Vitals:   03/07/21 1505 03/07/21 2025 03/07/21 2303 03/08/21 0422  BP: 140/85  (!) 147/72  131/76  Pulse: 87  93 83  Resp: 20  20 19   Temp: 97.9 F (36.6 C)  98 F (36.7 C) 98.1 F (36.7 C)  TempSrc: Oral  Oral   SpO2: 98% 95% 98% 98%   SpO2: 98 % O2 Flow Rate (L/min): 0 L/min FiO2 (%): 21 %   Intake/Output Summary (Last 24 hours) at 03/08/2021 5956 Last data filed at 03/07/2021 2000 Gross per 24 hour  Intake 585.63 ml  Output --  Net 585.63 ml    There were no vitals filed for this visit.  Exam: General exam: In no acute distress. Respiratory system: Good air movement and clear to auscultation. Cardiovascular system: S1 & S2 heard, RRR. No JVD. Gastrointestinal system: Abdomen is nondistended, soft and nontender.  Extremities: No pedal edema. Skin: No rashes, lesions or ulcers Psychiatry: Judgement and insight appear normal. Mood & affect appropriate.   Data Reviewed:    Labs: Basic Metabolic Panel: Recent Labs  Lab 03/06/21 1142 03/07/21 0011 03/08/21 0511  NA 134* 134* 140  K 4.2 3.0* 4.0  CL 97* 101 105  CO2 23 23 26   GLUCOSE 134* 152* 126*  BUN 16 15 10   CREATININE 1.33* 1.31* 1.17*  CALCIUM 9.9 8.5* 9.1    GFR Estimated Creatinine Clearance: 42 mL/min (A) (by C-G formula based on SCr of 1.17 mg/dL (H)). Liver Function Tests: Recent Labs  Lab 03/06/21 1334  AST 39  ALT 29  ALKPHOS 50  BILITOT 0.8  PROT 7.7  ALBUMIN 4.1    No results for input(s): LIPASE, AMYLASE in the last 168 hours. No results for input(s): AMMONIA in the last 168 hours. Coagulation profile No results for input(s): INR, PROTIME in the last 168 hours. COVID-19 Labs  No results for input(s): DDIMER, FERRITIN, LDH, CRP in the last 72 hours.  Lab Results  Component Value Date   SARSCOV2NAA NEGATIVE 03/06/2021   [place] Not Detected 02/18/2021   SARSCOV2NAA Detected (A) 10/21/2020   [place] NEGATIVE 09/29/2019    CBC: Recent Labs  Lab 03/06/21 1142 03/07/21 0011 03/07/21 0602  WBC 15.1* 13.1* 10.2  NEUTROABS  --   --  6.0  HGB 14.4 11.7*  11.7*  HCT 42.8 34.6* 35.1*  MCV 89.5 89.4 90.5  PLT 332 235 228    Cardiac Enzymes: No results for input(s): CKTOTAL, CKMB, CKMBINDEX, TROPONINI in the last 168 hours. BNP (last 3 results) No results for input(s): PROBNP in the last  8760 hours. CBG: Recent Labs  Lab 03/07/21 0016 03/07/21 0413 03/07/21 0710  GLUCAP 152* 148* 125*    D-Dimer: No results for input(s): DDIMER in the last 72 hours. Hgb A1c: No results for input(s): HGBA1C in the last 72 hours. Lipid Profile: No results for input(s): CHOL, HDL, LDLCALC, TRIG, CHOLHDL, LDLDIRECT in the last 72 hours. Thyroid function studies: Recent Labs    03/06/21 1659  TSH 0.803    Anemia work up: No results for input(s): VITAMINB12, FOLATE, FERRITIN, TIBC, IRON, RETICCTPCT in the last 72 hours. Sepsis Labs: Recent Labs  Lab 03/06/21 1142 03/06/21 1658 03/06/21 1916 03/07/21 0010 03/07/21 0011 03/07/21 0338 03/07/21 0602  PROCALCITON  --   --   --  <0.10  --   --   --   WBC 15.1*  --   --   --  13.1*  --  10.2  LATICACIDVEN  --    < > 2.8* 3.1*  --  1.7 2.7*   < > = values in this interval not displayed.    Microbiology Recent Results (from the past 240 hour(s))  Resp Panel by RT-PCR (Flu A&B, Covid) Nasopharyngeal Swab     Status: None   Collection Time: 03/06/21  1:53 PM   Specimen: Nasopharyngeal Swab; Nasopharyngeal(NP) swabs in vial transport medium  Result Value Ref Range Status   SARS Coronavirus 2 by RT PCR NEGATIVE NEGATIVE Final    Comment: (NOTE) SARS-CoV-2 target nucleic acids are NOT DETECTED.  The SARS-CoV-2 RNA is generally detectable in upper respiratory specimens during the acute phase of infection. The lowest concentration of SARS-CoV-2 viral copies this assay can detect is 138 copies/mL. A negative result does not preclude SARS-Cov-2 infection and should not be used as the sole basis for treatment or other patient management decisions. A negative result may occur with  improper  specimen collection/handling, submission of specimen other than nasopharyngeal swab, presence of viral mutation(s) within the areas targeted by this assay, and inadequate number of viral copies(<138 copies/mL). A negative result must be combined with clinical observations, patient history, and epidemiological information. The expected result is Negative.  Fact Sheet for Patients:  EntrepreneurPulse.com.au  Fact Sheet for Healthcare Providers:  IncredibleEmployment.be  This test is no t yet approved or cleared by the [place] FDA and  has been authorized for detection and/or diagnosis of SARS-CoV-2 by FDA under an Emergency Use Authorization (EUA). This EUA will remain  in effect (meaning this test can be used) for the duration of the COVID-19 declaration under Section 564(b)(1) of the Act, 21 U.S.C.section 360bbb-3(b)(1), unless the authorization is terminated  or revoked sooner.       Influenza A by PCR NEGATIVE NEGATIVE Final   Influenza B by PCR NEGATIVE NEGATIVE Final    Comment: (NOTE) The Xpert Xpress SARS-CoV-2/FLU/RSV plus assay is intended as an aid in the diagnosis of influenza from Nasopharyngeal swab specimens and should not be used as a sole basis for treatment. Nasal washings and aspirates are unacceptable for Xpert Xpress SARS-CoV-2/FLU/RSV testing.  Fact Sheet for Patients: EntrepreneurPulse.com.au  Fact Sheet for Healthcare Providers: IncredibleEmployment.be  This test is not yet approved or cleared by the [place] FDA and has been authorized for detection and/or diagnosis of SARS-CoV-2 by FDA under an Emergency Use Authorization (EUA). This EUA will remain in effect (meaning this test can be used) for the duration of the COVID-19 declaration under Section 564(b)(1) of the Act, 21 U.S.C. section 360bbb-3(b)(1), unless the authorization is terminated  or revoked.  Performed at  [facility], [address], [place], [place] [address]   Blood culture (routine x 2)     Status: None (Preliminary result)   Collection Time: 03/06/21  4:58 PM   Specimen: BLOOD LEFT HAND  Result Value Ref Range Status   Specimen Description BLOOD LEFT HAND  Final   Special Requests   Final    BOTTLES DRAWN AEROBIC AND ANAEROBIC Blood Culture adequate volume   Culture   Final    NO GROWTH 2 DAYS Performed at [facility], [address], [place], [place] [address]    Report Status PENDING  Incomplete  Blood culture (routine x 2)     Status: None (Preliminary result)   Collection Time: 03/06/21  5:03 PM   Specimen: BLOOD RIGHT WRIST  Result Value Ref Range Status   Specimen Description BLOOD RIGHT WRIST  Final   Special Requests   Final    BOTTLES DRAWN AEROBIC AND ANAEROBIC Blood Culture results may not be optimal due to an inadequate volume of blood received in culture bottles   Culture   Final    NO GROWTH 2 DAYS Performed at [facility], [address] [place], [place] [address]    Report Status PENDING  Incomplete     Medications:    DULoxetine  60 mg Oral Daily   gabapentin  300 mg Oral TID   heparin  5,000 Units Subcutaneous Q8H   levothyroxine  88 mcg Oral Daily   loratadine  10 mg Oral QHS   metoprolol tartrate  12.5 mg Oral BID   mometasone-formoterol  2 puff Inhalation BID   pantoprazole  20 mg Oral QHS   spironolactone  25 mg Oral Daily   Continuous Infusions:  cefTRIAXone (ROCEPHIN)  IV Stopped (03/07/21 1550)      LOS: 2 days   Charlynne Cousins  [facility]  03/08/2021, 7:21 AM

## 2021-03-09 LAB — BASIC METABOLIC PANEL
Anion gap: 9 (ref 5–15)
BUN: 9 mg/dL (ref 8–23)
CO2: 25 mmol/L (ref 22–32)
Calcium: 9.3 mg/dL (ref 8.9–10.3)
Chloride: 105 mmol/L (ref 98–111)
Creatinine, Ser: 1.04 mg/dL — ABNORMAL HIGH (ref 0.44–1.00)
GFR, Estimated: 55 mL/min — ABNORMAL LOW (ref >60)
Glucose, Bld: 109 mg/dL — ABNORMAL HIGH (ref 70–99)
Potassium: 3.9 mmol/L (ref 3.5–5.1)
Sodium: 139 mmol/L (ref 135–145)

## 2021-03-09 MED ORDER — METOPROLOL TARTRATE 25 MG PO TABS: 12.5000 mg | ORAL_TABLET | Freq: Two times a day (BID) | ORAL | 3 refills | Status: DC

## 2021-03-09 MED ORDER — CEPHALEXIN 500 MG PO CAPS
500.0000 mg | ORAL_CAPSULE | Freq: Four times a day (QID) | ORAL | Status: DC
  Administered 2021-03-09: 500 mg via ORAL
  Filled 2021-03-09: qty 1

## 2021-03-09 MED ORDER — CEPHALEXIN 500 MG PO CAPS: 500.0000 mg | ORAL_CAPSULE | Freq: Four times a day (QID) | ORAL | 0 refills | Status: AC

## 2021-03-09 MED ORDER — TORSEMIDE 20 MG PO TABS
20.0000 mg | ORAL_TABLET | Freq: Two times a day (BID) | ORAL | Status: DC
  Administered 2021-03-09: 20 mg via ORAL
  Filled 2021-03-09: qty 1

## 2021-03-09 NOTE — Progress Notes (Incomplete)
[facility] PROGRESS NOTE    Progress Note  Sheila [name]  ERD:[id number] DOB: 09/06/1940 DOA: 03/06/2021 PCP: Sheila Balloon, FNP     Brief Narrative:   Sheila [name] is an 81 y.o. female past medical history significant for COPD, nonischemic cardiomyopathy with last EF in 2021 with 20 to 25% and grade 3 diastolic heart failure peptic ulcer disease and chronic kidney disease comes in complaining of weakness, in the ED she was found to be tachycardic, mild leukocytosis elevated lactic acid imaging showed no abnormalities SARS-CoV-2 PCR were negative.  Blood cultures and urine cultures were sent and she was started on IV Rocephin and fluid resuscitated.   Assessment/Plan:   SIRS (systemic inflammatory response syndrome) ([place]): She meets SIRS criteria imaging showed no source. Urine culture was inconclusive. We will transition her to oral Keflex. T-max of 98.1, her leukocytosis is resolved. Physical therapy evaluated the patient, no home health PT  Acute kidney injury on chronic kidney disease stage IIIa: With a baseline creatinine of less than 1 on admission 1.3. Creatinine has returned to baseline.  Can resume Entresto and diuretic therapy  Chronic systolic heart failure/nonischemic cardiomyopathy: Repeated 2D echo showed an EF of 45 to 50%. Diuretics and Entresto were held on admission. The blood pressure is trending up we will go ahead and start her back on her Entresto continue Aldactone check a basic metabolic panel in the morning On admission she was previously not on beta-blocker, there was documentation in the chart that when she was intolerant, she was started on low-dose metoprolol and her heart rate has improved.  Sinus tachycardia: Previous EF of 25% she was not on a beta-blocker, her EF now is 45 to 50%. She was started on a beta-blocker her blood pressure is tolerated her heart rate has improved.    Essential hypertension, benign Blood pressure is trending up  resume Entresto, continue Aldactone and metoprolol.   Check a basic metabolic panel tomorrow morning.  Depression: Continue Cymbalta and Xanax.  Hypothyroidism: Continue Synthroid.  COPD (chronic obstructive pulmonary disease) (HCC) Stable    DVT prophylaxis: lovenox Family Communication:none Status is: Inpatient  Remains inpatient appropriate because: SIRS and acute kidney injury        Code Status:     Code Status Orders  (From admission, onward)           Start     Ordered   03/06/21 2111  Full code  Continuous        03/06/21 2111           Code Status History     Date Active Date Inactive Code Status Order ID Comments User Context   10/02/2019 1413 10/06/2019 0057 Full Code [id number]  Sheila Harp, MD Inpatient   08/24/2016 1740 08/25/2016 1450 Full Code [id number]  Sheila Pies, MD Inpatient   08/01/2013 1405 08/02/2013 1332 Full Code [id number]  Sheila So, MD Inpatient   07/31/2013 1809 08/01/2013 1405 Full Code [id number]  Sheila So, MD Inpatient   01/13/2011 1811 01/15/2011 1846 Full Code [id number]  Sheila Alberts, MD ED         IV Access:   Peripheral IV   Procedures and diagnostic studies:   ECHOCARDIOGRAM COMPLETE  Result Date: 03/07/2021    ECHOCARDIOGRAM REPORT   Patient Name:   Sheila [name] Date of Exam: 03/07/2021 Medical Rec #:  [id number]    Height:       59.0 in Accession #:  [id number]   Weight:       233.0 lb Date of Birth:  06/25/1940    BSA:          1.968 m Patient Age:    58 years     BP:           141/65 mmHg Patient Gender: F            HR:           98 bpm. Exam Location:  [facility] Procedure: 2D Echo Indications:    dyspnea  History:        Patient has prior history of Echocardiogram examinations, most                 recent 09/20/2019. CHF, COPD and chronic kidney disease; Risk                 Factors:Former Smoker, Hypertension and Dyslipidemia.  Sonographer:    Johny Chess RDCS Referring Phys: [id number] [facility]  Sonographer Comments: Patient is morbidly obese. Image acquisition challenging due to patient body habitus. IMPRESSIONS  1. Left ventricular ejection fraction, by estimation, is 45 to 50%. The left ventricle has mildly decreased function. Left ventricular endocardial border not optimally defined to evaluate regional wall motion. Left ventricular diastolic parameters are indeterminate.  2. Right ventricular systolic function is normal. The right ventricular size is normal. There is normal pulmonary artery systolic pressure. The estimated right ventricular systolic pressure is 94.7 mmHg.  3. The mitral valve is grossly normal. Trivial mitral valve regurgitation.  4. The aortic valve is tricuspid. Aortic valve regurgitation is not visualized. Aortic valve sclerosis/calcification is present, without any evidence of aortic stenosis.  5. Aortic dilatation noted. There is mild dilatation of the ascending aorta, measuring 39 mm.  6. The inferior vena cava is normal in size with greater than 50% respiratory variability, suggesting right atrial pressure of 3 mmHg. Comparison(s): Prior images reviewed side by side. There has been significant improvement in LVEF in comparison with prior study. FINDINGS  Left Ventricle: Left ventricular ejection fraction, by estimation, is 45 to 50%. The left ventricle has mildly decreased function. Left ventricular endocardial border not optimally defined to evaluate regional wall motion. The left ventricular internal cavity size was normal in size. There is no left ventricular hypertrophy. Left ventricular diastolic parameters are indeterminate. Right Ventricle: The right ventricular size is normal. No increase in right ventricular wall thickness. Right ventricular systolic function is normal. There is normal pulmonary artery systolic pressure. The tricuspid regurgitant velocity is 2.12 m/s, and  with an assumed right atrial pressure of 3 mmHg, the estimated right ventricular  systolic pressure is 65.4 mmHg. Left Atrium: Left atrial size was normal in size. Right Atrium: Right atrial size was normal in size. Pericardium: There is no evidence of pericardial effusion. Presence of epicardial fat layer. Mitral Valve: The mitral valve is grossly normal. Trivial mitral valve regurgitation. Tricuspid Valve: The tricuspid valve is grossly normal. Tricuspid valve regurgitation is mild. Aortic Valve: The aortic valve is tricuspid. There is mild to moderate aortic valve annular calcification. Aortic valve regurgitation is not visualized. Aortic valve sclerosis/calcification is present, without any evidence of aortic stenosis. Pulmonic Valve: The pulmonic valve was grossly normal. Pulmonic valve regurgitation is trivial. Aorta: The aortic root is normal in size and structure and aortic dilatation noted. There is mild dilatation of the ascending aorta, measuring 39 mm. Venous: The inferior vena cava is normal in size with greater  than 50% respiratory variability, suggesting right atrial pressure of 3 mmHg. IAS/Shunts: No atrial level shunt detected by color flow Doppler.  LEFT VENTRICLE PLAX 2D LVIDd:         5.10 cm LVIDs:         4.30 cm LV PW:         1.00 cm LV IVS:        1.00 cm LVOT diam:     1.90 cm LVOT Area:     2.84 cm  RIGHT VENTRICLE             IVC RV S prime:     14.90 cm/s  IVC diam: 1.10 cm LEFT ATRIUM             Index        RIGHT ATRIUM           Index LA diam:        3.10 cm 1.58 cm/m   RA Area:     13.80 cm LA Vol (A2C):   52.1 ml 26.47 ml/m  RA Volume:   33.80 ml  17.17 ml/m LA Vol (A4C):   44.3 ml 22.51 ml/m LA Biplane Vol: 48.4 ml 24.59 ml/m   AORTA Ao Root diam: 2.90 cm Ao Asc diam:  3.90 cm TRICUSPID VALVE TR Peak grad:   18.0 mmHg TR Vmax:        212.00 cm/s  SHUNTS Systemic Diam: 1.90 cm Rozann Lesches MD Electronically signed by Rozann Lesches MD Signature Date/Time: 03/07/2021/1:53:15 PM    Final      Medical Consultants:   None.   Subjective:    [name] relates she feels better want to go home today.  Objective:    Vitals:   03/08/21 1526 03/08/21 2020 03/08/21 2134 03/09/21 0630  BP:   (!) 143/86 (!) 146/73  Pulse:   82 69  Resp:   17 18  Temp:   98.1 F (36.7 C) 97.9 F (36.6 C)  TempSrc:      SpO2: 97% 97% 96% 100%  Weight:      Height:       SpO2: 100 % O2 Flow Rate (L/min): 0 L/min FiO2 (%): 21 %   Intake/Output Summary (Last 24 hours) at 03/09/2021 0729 Last data filed at 03/08/2021 1829 Gross per 24 hour  Intake 720 ml  Output --  Net 720 ml    Filed Weights   03/08/21 1000  Weight: 107.1 kg    Exam: General exam: In no acute distress. Respiratory system: Good air movement and clear to auscultation. Cardiovascular system: S1 & S2 heard, RRR. No JVD. Gastrointestinal system: Abdomen is nondistended, soft and nontender.  Extremities: No pedal edema. Skin: No rashes, lesions or ulcers Psychiatry: Judgement and insight appear normal. Mood & affect appropriate.   Data Reviewed:    Labs: Basic Metabolic Panel: Recent Labs  Lab 03/06/21 1142 03/07/21 0011 03/08/21 0511 03/09/21 0618  NA 134* 134* 140 139  K 4.2 3.0* 4.0 3.9  CL 97* 101 105 105  CO2 23 23 26 25   GLUCOSE 134* 152* 126* 109*  BUN 16 15 10 9   CREATININE 1.33* 1.31* 1.17* 1.04*  CALCIUM 9.9 8.5* 9.1 9.3    GFR Estimated Creatinine Clearance: 47.6 mL/min (A) (by C-G formula based on SCr of 1.04 mg/dL (H)). Liver Function Tests: Recent Labs  Lab 03/06/21 1334  AST 39  ALT 29  ALKPHOS 50  BILITOT 0.8  PROT 7.7  ALBUMIN 4.1    No results for input(s): LIPASE, AMYLASE in the last 168 hours. No results for input(s): AMMONIA in the last 168 hours. Coagulation profile No results for input(s): INR, PROTIME in the last 168 hours. COVID-19 Labs  No results for input(s): DDIMER, FERRITIN, LDH, CRP in the last 72 hours.  Lab Results  Component Value Date   SARSCOV2NAA NEGATIVE 03/06/2021   [place] Not Detected  02/18/2021   SARSCOV2NAA Detected (A) 10/21/2020   [place] NEGATIVE 09/29/2019    CBC: Recent Labs  Lab 03/06/21 1142 03/07/21 0011 03/07/21 0602  WBC 15.1* 13.1* 10.2  NEUTROABS  --   --  6.0  HGB 14.4 11.7* 11.7*  HCT 42.8 34.6* 35.1*  MCV 89.5 89.4 90.5  PLT 332 235 228    Cardiac Enzymes: No results for input(s): CKTOTAL, CKMB, CKMBINDEX, TROPONINI in the last 168 hours. BNP (last 3 results) No results for input(s): PROBNP in the last 8760 hours. CBG: Recent Labs  Lab 03/07/21 0016 03/07/21 0413 03/07/21 0710  GLUCAP 152* 148* 125*    D-Dimer: No results for input(s): DDIMER in the last 72 hours. Hgb A1c: No results for input(s): HGBA1C in the last 72 hours. Lipid Profile: No results for input(s): CHOL, HDL, LDLCALC, TRIG, CHOLHDL, LDLDIRECT in the last 72 hours. Thyroid function studies: Recent Labs    03/06/21 1659  TSH 0.803    Anemia work up: No results for input(s): VITAMINB12, FOLATE, FERRITIN, TIBC, IRON, RETICCTPCT in the last 72 hours. Sepsis Labs: Recent Labs  Lab 03/06/21 1142 03/06/21 1658 03/06/21 1916 03/07/21 0010 03/07/21 0011 03/07/21 0338 03/07/21 0602  PROCALCITON  --   --   --  <0.10  --   --   --   WBC 15.1*  --   --   --  13.1*  --  10.2  LATICACIDVEN  --    < > 2.8* 3.1*  --  1.7 2.7*   < > = values in this interval not displayed.    Microbiology Recent Results (from the past 240 hour(s))  Urine Culture     Status: Abnormal   Collection Time: 03/06/21  1:51 PM   Specimen: Urine, Clean Catch  Result Value Ref Range Status   Specimen Description   Final    URINE, CLEAN CATCH Performed at [facility], [address], [place], [place] [address]    Special Requests   Final    NONE Performed at [facility], [address], [place], [place] [address]    Culture MULTIPLE SPECIES PRESENT, SUGGEST RECOLLECTION (A)  Final   Report Status 03/08/2021 FINAL  Final  Resp Panel by RT-PCR (Flu A&B, Covid) Nasopharyngeal Swab      Status: None   Collection Time: 03/06/21  1:53 PM   Specimen: Nasopharyngeal Swab; Nasopharyngeal(NP) swabs in vial transport medium  Result Value Ref Range Status   SARS Coronavirus 2 by RT PCR NEGATIVE NEGATIVE Final    Comment: (NOTE) SARS-CoV-2 target nucleic acids are NOT DETECTED.  The SARS-CoV-2 RNA is generally detectable in upper respiratory specimens during the acute phase of infection. The lowest concentration of SARS-CoV-2 viral copies this assay can detect is 138 copies/mL. A negative result does not preclude SARS-Cov-2 infection and should not be used as the sole basis for treatment or other patient management decisions. A negative result may occur with  improper specimen collection/handling, submission of specimen other than nasopharyngeal swab, presence of viral mutation(s) within the areas targeted by this assay, and inadequate  number of viral copies(<138 copies/mL). A negative result must be combined with clinical observations, patient history, and epidemiological information. The expected result is Negative.  Fact Sheet for Patients:  EntrepreneurPulse.com.au  Fact Sheet for Healthcare Providers:  IncredibleEmployment.be  This test is no t yet approved or cleared by the [place] FDA and  has been authorized for detection and/or diagnosis of SARS-CoV-2 by FDA under an Emergency Use Authorization (EUA). This EUA will remain  in effect (meaning this test can be used) for the duration of the COVID-19 declaration under Section 564(b)(1) of the Act, 21 U.S.C.section 360bbb-3(b)(1), unless the authorization is terminated  or revoked sooner.       Influenza A by PCR NEGATIVE NEGATIVE Final   Influenza B by PCR NEGATIVE NEGATIVE Final    Comment: (NOTE) The Xpert Xpress SARS-CoV-2/FLU/RSV plus assay is intended as an aid in the diagnosis of influenza from Nasopharyngeal swab specimens and should not be used as a sole  basis for treatment. Nasal washings and aspirates are unacceptable for Xpert Xpress SARS-CoV-2/FLU/RSV testing.  Fact Sheet for Patients: EntrepreneurPulse.com.au  Fact Sheet for Healthcare Providers: IncredibleEmployment.be  This test is not yet approved or cleared by the [place] FDA and has been authorized for detection and/or diagnosis of SARS-CoV-2 by FDA under an Emergency Use Authorization (EUA). This EUA will remain in effect (meaning this test can be used) for the duration of the COVID-19 declaration under Section 564(b)(1) of the Act, 21 U.S.C. section 360bbb-3(b)(1), unless the authorization is terminated or revoked.  Performed at [facility], [address], [place], [place] [address]   Blood culture (routine x 2)     Status: None (Preliminary result)   Collection Time: 03/06/21  4:58 PM   Specimen: BLOOD LEFT HAND  Result Value Ref Range Status   Specimen Description BLOOD LEFT HAND  Final   Special Requests   Final    BOTTLES DRAWN AEROBIC AND ANAEROBIC Blood Culture adequate volume   Culture   Final    NO GROWTH 2 DAYS Performed at [facility], [address] [place], [place] [address]    Report Status PENDING  Incomplete  Blood culture (routine x 2)     Status: None (Preliminary result)   Collection Time: 03/06/21  5:03 PM   Specimen: BLOOD RIGHT WRIST  Result Value Ref Range Status   Specimen Description BLOOD RIGHT WRIST  Final   Special Requests   Final    BOTTLES DRAWN AEROBIC AND ANAEROBIC Blood Culture results may not be optimal due to an inadequate volume of blood received in culture bottles   Culture   Final    NO GROWTH 2 DAYS Performed at [facility], [address] [place],  [address]    Report Status PENDING  Incomplete     Medications:    DULoxetine  60 mg Oral Daily   gabapentin  300 mg Oral TID   heparin  5,000 Units Subcutaneous Q8H   levothyroxine  88 mcg Oral Daily   loratadine  10  mg Oral QHS   metoprolol tartrate  12.5 mg Oral BID   mometasone-formoterol  2 puff Inhalation BID   pantoprazole  20 mg Oral QHS   sacubitril-valsartan  1 tablet Oral BID   spironolactone  25 mg Oral Daily   Continuous Infusions:  cefTRIAXone (ROCEPHIN)  IV 2 g (03/08/21 1459)      LOS: 3 days   Charlynne Cousins  [facility]  03/09/2021, 7:29 AM

## 2021-03-09 NOTE — Progress Notes (Signed)
Pt has discharge orders, discharge teaching given along with written prescriptions handed to pt/ No further questions at this time. Pts daughter debra contacted and made aware of discharge orders and states she will be on the way. Pt wheeled down to our lobby waiting area with her belongings to wait for daughter to arrive.

## 2021-03-09 NOTE — Discharge Summary (Addendum)
Physician Discharge Summary  Sheila [name] KGU:[id number] DOB: 12/30/1940 DOA: 03/06/2021  PCP: Sharion Balloon, FNP  Admit date: 03/06/2021 Discharge date: 03/09/2021  Admitted From: Home Disposition:  Home  Recommendations for Outpatient Follow-up:  Follow up with PCP in 1-2 weeks Please obtain BMP/CBC in one week  Home Health:No Equipment/Devices:None  Discharge Condition:Stable CODE STATUS:Full Diet recommendation: Heart Healthy  Brief/Interim Summary: 81 y.o. female past medical history significant for COPD, nonischemic cardiomyopathy with last EF in 2021 with 20 to 25% and grade 3 diastolic heart failure peptic ulcer disease and chronic kidney disease comes in complaining of weakness, in the ED she was found to be tachycardic, mild leukocytosis elevated lactic acid imaging showed no abnormalities SARS-CoV-2 PCR were negative.  Blood cultures and urine cultures were sent and she was started on IV Rocephin and fluid resuscitated.  Discharge Diagnoses:  Principal Problem:   SIRS (systemic inflammatory response syndrome) (HCC) Active Problems:   Essential hypertension, benign   COPD (chronic obstructive pulmonary disease) (HCC)   Chronic kidney disease, stage 3 unspecified (HCC)   Chronic systolic CHF (congestive heart failure) (HCC)  SIRS: Possible UTI / UA showed signs of infection her urine culture was inconclusive. Admission she was started on IV Rocephin and then transition to oral Keflex she defervesced her leukocytosis resolved she will continue Keflex as an outpatient. Physical therapy evaluated the patient, no home with PT needed.  Acute kidney injury on chronic kidney disease stage IIIa: With a baseline creatinine of less than 1 on admission 1.3 she was fluid resuscitated diuretics and Entresto were held and her creatinine returned to baseline these medications will resume as an outpatient.  Chronic systolic heart failure/nonischemic cardiomyopathy: Repeated 2D echo  showed an EF of 45%. Entresto and diuretic therapy were held on admission as her blood pressure started to trend up and her creatinine improved they were resumed. She was not on a beta-blocker on admission she was started on low-dose beta-blockers which she tolerated should continue as an outpatient.  Sinus tachycardia: With a 2D echo 45% she was started on low-dose beta-blocker her tachycardia has resolved.  Central hypertension: No changes made to her medication metoprolol was added her creatinine has returned to baseline.  Depression: Continue Cymbalta and Xanax.  Hypothyroidism: Continue Synthroid.  Discharge Instructions  Discharge Instructions     Diet - low sodium heart healthy   Complete by: As directed    Increase activity slowly   Complete by: As directed       Allergies as of 03/09/2021       Reactions   Ibuprofen Other (See Comments)   History of bleeding ulcers - contra-indicated   Benadryl [diphenhydramine Hcl] Other (See Comments)   Worsening depression   Lipitor [atorvastatin] Other (See Comments)   Body aches.   Baclofen Other (See Comments)   Loopy    Hydrocodone Nausea And Vomiting   Codeine Nausea And Vomiting   Morphine And Related Nausea And Vomiting   Tdap [tetanus-diphth-acell Pertussis] Swelling   Redness and localized swelling at injection site        Medication List     STOP taking these medications    FISH OIL OMEGA-3 PO   meclizine 25 MG tablet Commonly known as: ANTIVERT   multivitamin with minerals Tabs tablet   nystatin powder Commonly known as: MYCOSTATIN/NYSTOP   triamcinolone ointment 0.5 % Commonly known as: KENALOG       TAKE these medications    albuterol (2.5 MG/3ML) 0.083% nebulizer  solution Commonly known as: PROVENTIL NEBULIZE 1 VIAL EVERY 6-8 HOURS AS NEEDED FOR WHEEZING OR SHORTNESS OF BREATH   ALPRAZolam 0.5 MG tablet Commonly known as: XANAX Take 1 tablet (0.5 mg total) by mouth 2 (two) times  daily as needed for anxiety. What changed: when to take this   cephALEXin 500 MG capsule Commonly known as: KEFLEX Take 1 capsule (500 mg total) by mouth every 6 (six) hours for 3 days.   cinacalcet 30 MG tablet Commonly known as: Sensipar Take 1 tablet (30 mg total) by mouth every other day.   diclofenac sodium 1 % Gel Commonly known as: VOLTAREN Apply 2 g topically 4 (four) times daily.   DULoxetine 60 MG capsule Commonly known as: CYMBALTA TAKE 1 CAPUSLE ONCE DAILY What changed: See the new instructions.   Entresto 49-51 MG Generic drug: sacubitril-valsartan TAKE 1 TABLET 2 TIMES A DAY What changed: when to take this   fluticasone 50 MCG/ACT nasal spray Commonly known as: FLONASE PLACE 2 SPRAYS INTO BOTH NOSTRILS DAILY   gabapentin 300 MG capsule Commonly known as: NEURONTIN TAKE 1 CAPSULE 3 TIMES A DAY   hydrOXYzine 25 MG capsule Commonly known as: VISTARIL TAKE ONE CAPSULE THREE TIMES DAILY AS NEEDED What changed: See the new instructions.   levocetirizine 5 MG tablet Commonly known as: XYZAL TAKE 1 TABLET ONCE DAILY IN THE EVENING What changed: See the new instructions.   levothyroxine 88 MCG tablet Commonly known as: SYNTHROID TAKE 1 TABLET DAILY   Livalo 4 MG Tabs Generic drug: Pitavastatin Calcium TAKE 1 TABLET DAILY   metoprolol tartrate 25 MG tablet Commonly known as: LOPRESSOR Take 0.5 tablets (12.5 mg total) by mouth 2 (two) times daily.   pantoprazole 20 MG tablet Commonly known as: PROTONIX TAKE ONE TABLET AT BEDTIME   Prolia 60 MG/ML Sosy injection Generic drug: denosumab Inject into the skin once.   spironolactone 25 MG tablet Commonly known as: ALDACTONE TAKE 1 TABLET ONCE DAILY   Symbicort 160-4.5 MCG/ACT inhaler Generic drug: budesonide-formoterol INHALE 2 PUFFS TWICE DAILY   torsemide 20 MG tablet Commonly known as: DEMADEX TAKE 2 TABLETS DAILY AS DIRECTED What changed: See the new instructions.   traMADol 50 MG  tablet Commonly known as: ULTRAM TAKE TWO TABLETS EVERY TWELVE HOURS AS NEEDED FOR PAIN   VISINE OP Place 1 drop into both eyes daily as needed (dry eyes/itching).   Vitamin D3 25 MCG (1000 UT) Caps Take 1,000 Units by mouth daily.        Allergies  Allergen Reactions   Ibuprofen Other (See Comments)    History of bleeding ulcers - contra-indicated    Benadryl [Diphenhydramine Hcl] Other (See Comments)    Worsening depression    Lipitor [Atorvastatin] Other (See Comments)    Body aches.   Baclofen Other (See Comments)    Loopy    Hydrocodone Nausea And Vomiting   Codeine Nausea And Vomiting   Morphine And Related Nausea And Vomiting   Tdap [Tetanus-Diphth-Acell Pertussis] Swelling    Redness and localized swelling at injection site     Consultations: none   Procedures/Studies: DG Chest 2 View  Result Date: 03/06/2021 CLINICAL DATA:  Tachycardia.  Increased weakness. EXAM: CHEST - 2 VIEW COMPARISON:  06/28/2020 FINDINGS: Normal sized heart. Tortuous aorta. Clear lungs with normal vascularity. No acute bony abnormality. Lumbar spine fixation hardware. IMPRESSION: No acute abnormality. Electronically Signed   By: Claudie Revering M.D.   On: 03/06/2021 11:53   CT Angio Chest PE  W and/or Wo Contrast  Result Date: 03/06/2021 CLINICAL DATA:  Left lower quadrant pain tachycardia weakness EXAM: CT ANGIOGRAPHY CHEST CT ABDOMEN AND PELVIS WITH CONTRAST TECHNIQUE: Multidetector CT imaging of the chest was performed using the standard protocol during bolus administration of intravenous contrast. Multiplanar CT image reconstructions and MIPs were obtained to evaluate the vascular anatomy. Multidetector CT imaging of the abdomen and pelvis was performed using the standard protocol during bolus administration of intravenous contrast. RADIATION DOSE REDUCTION: This exam was performed according to the departmental dose-optimization program which includes automated exposure control, adjustment  of the mA and/or kV according to patient size and/or use of iterative reconstruction technique. CONTRAST:  99mL OMNIPAQUE IOHEXOL 350 MG/ML SOLN COMPARISON:  Chest x-ray 03/06/2021, CT 08/29/2018 FINDINGS: CTA CHEST FINDINGS Cardiovascular: Satisfactory opacification of the pulmonary arteries to the segmental level. No evidence of pulmonary embolism. Mild motion degradation limits the exam. Cardiomegaly. No pericardial effusion. Mild atherosclerosis. No aneurysm or dissection. 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 Coronary vascular calcification. Mediastinum/Nodes: Midline trachea. No thyroid mass. No suspicious lymph nodes. Esophagus within normal limits. Lungs/Pleura: No consolidation, pleural effusion, or pneumothorax. Mild bandlike scarring within the subpleural lower lobes. Musculoskeletal: Sternum is intact. Chronic inferior endplate fracture at P59. Review of the MIP images confirms the above findings. CT ABDOMEN and PELVIS FINDINGS Hepatobiliary: Gallstone. No biliary dilatation. No focal hepatic abnormality Pancreas: Unremarkable. No pancreatic ductal dilatation or surrounding inflammatory changes. Spleen: Normal in size without focal abnormality. Adrenals/Urinary Tract: Adrenal glands are normal. Nonobstructing stone in the lower pole of the right kidney measuring 9 mm. Subcentimeter hypodensities in the left kidney too small to further characterize. The bladder is normal Stomach/Bowel: The stomach is nonenlarged. No dilated small bowel. Diverticular disease of the colon without acute inflammatory process. Status post appendectomy. Vascular/Lymphatic: Moderate severe aortic atherosclerosis without aneurysm. No suspicious lymph nodes Reproductive: Uterus and bilateral adnexa are unremarkable. Other: Negative for pelvic effusion or free air. Moderate fat containing umbilical hernia Musculoskeletal: Posterior fusion hardware at L4-L5.  Chronic inferior endplate fracture at F63. Moderate severe compression fracture at L1 with about 6 mm retropulsion that results in mild canal stenosis. The appearance suggests chronic fracture but this is new since 2020. Review of the MIP images confirms the above findings. IMPRESSION: 1. Negative for acute pulmonary embolus. Cardiomegaly without acute airspace disease. 2. No CT evidence for acute intra-abdominal or pelvic abnormality. 3. Gallstones 4. Nonobstructing right kidney stone 5. 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 Moderate severe compression fracture L1, suspect chronic but is new as compared with CT from 2020 7. Diverticular disease of the colon without acute wall thickening Electronically Signed   By: Donavan Foil M.D.   On: 03/06/2021 16:28   CT ABDOMEN PELVIS W CONTRAST  Result Date: 03/06/2021 CLINICAL DATA:  Left lower quadrant pain tachycardia weakness EXAM: CT ANGIOGRAPHY CHEST CT ABDOMEN AND PELVIS WITH CONTRAST TECHNIQUE: Multidetector CT imaging of the chest was performed using the standard protocol during bolus administration of intravenous contrast. Multiplanar CT image reconstructions and MIPs were obtained to evaluate the vascular anatomy. Multidetector CT imaging of the abdomen and pelvis was performed using the standard protocol during bolus administration of intravenous contrast. RADIATION DOSE REDUCTION: This exam was performed according to the departmental dose-optimization program which includes automated exposure control, adjustment of the mA and/or kV according to patient size and/or use of iterative reconstruction technique. CONTRAST:  81mL OMNIPAQUE IOHEXOL 350 MG/ML  SOLN COMPARISON:  Chest x-ray 03/06/2021, CT 08/29/2018 FINDINGS: CTA CHEST FINDINGS Cardiovascular: Satisfactory opacification of the pulmonary arteries to the segmental level. No evidence of pulmonary embolism. Mild motion degradation limits the exam. Cardiomegaly. No pericardial  effusion. Mild atherosclerosis. No aneurysm or dissection. 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 Coronary vascular calcification. Mediastinum/Nodes: Midline trachea. No thyroid mass. No suspicious lymph nodes. Esophagus within normal limits. Lungs/Pleura: No consolidation, pleural effusion, or pneumothorax. Mild bandlike scarring within the subpleural lower lobes. Musculoskeletal: Sternum is intact. Chronic inferior endplate fracture at D32. Review of the MIP images confirms the above findings. CT ABDOMEN and PELVIS FINDINGS Hepatobiliary: Gallstone. No biliary dilatation. No focal hepatic abnormality Pancreas: Unremarkable. No pancreatic ductal dilatation or surrounding inflammatory changes. Spleen: Normal in size without focal abnormality. Adrenals/Urinary Tract: Adrenal glands are normal. Nonobstructing stone in the lower pole of the right kidney measuring 9 mm. Subcentimeter hypodensities in the left kidney too small to further characterize. The bladder is normal Stomach/Bowel: The stomach is nonenlarged. No dilated small bowel. Diverticular disease of the colon without acute inflammatory process. Status post appendectomy. Vascular/Lymphatic: Moderate severe aortic atherosclerosis without aneurysm. No suspicious lymph nodes Reproductive: Uterus and bilateral adnexa are unremarkable. Other: Negative for pelvic effusion or free air. Moderate fat containing umbilical hernia Musculoskeletal: Posterior fusion hardware at L4-L5. Chronic inferior endplate fracture at K02. Moderate severe compression fracture at L1 with about 6 mm retropulsion that results in mild canal stenosis. The appearance suggests chronic fracture but this is new since 2020. Review of the MIP images confirms the above findings. IMPRESSION: 1. Negative for acute pulmonary embolus. Cardiomegaly without acute airspace disease. 2. No CT evidence for acute intra-abdominal or  pelvic abnormality. 3. Gallstones 4. Nonobstructing right kidney stone 5. 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 Moderate severe compression fracture L1, suspect chronic but is new as compared with CT from 2020 7. Diverticular disease of the colon without acute wall thickening Electronically Signed   By: Donavan Foil M.D.   On: 03/06/2021 16:28   ECHOCARDIOGRAM COMPLETE  Result Date: 03/07/2021    ECHOCARDIOGRAM REPORT   Patient Name:   Sheila [name] Date of Exam: 03/07/2021 Medical Rec #:  [id number]    Height:       59.0 in Accession #:    [id number]   Weight:       233.0 lb Date of Birth:  1940-09-06    BSA:          1.968 m Patient Age:    48 years     BP:           141/65 mmHg Patient Gender: F            HR:           98 bpm. Exam Location:  [facility] Procedure: 2D Echo Indications:    dyspnea  History:        Patient has prior history of Echocardiogram examinations, most                 recent 09/20/2019. CHF, COPD and chronic kidney disease; Risk                 Factors:Former Smoker, Hypertension and Dyslipidemia.  Sonographer:    Johny Chess RDCS Referring Phys: [id number] [facility]  Sonographer Comments: Patient is morbidly obese. Image acquisition challenging due to patient body habitus. IMPRESSIONS  1. Left ventricular ejection fraction,  by estimation, is 45 to 50%. The left ventricle has mildly decreased function. Left ventricular endocardial border not optimally defined to evaluate regional wall motion. Left ventricular diastolic parameters are indeterminate.  2. Right ventricular systolic function is normal. The right ventricular size is normal. There is normal pulmonary artery systolic pressure. The estimated right ventricular systolic pressure is 30.8 mmHg.  3. The mitral valve is grossly normal. Trivial mitral valve regurgitation.  4. The aortic valve is tricuspid. Aortic valve regurgitation is not visualized. Aortic valve sclerosis/calcification is present,  without any evidence of aortic stenosis.  5. Aortic dilatation noted. There is mild dilatation of the ascending aorta, measuring 39 mm.  6. The inferior vena cava is normal in size with greater than 50% respiratory variability, suggesting right atrial pressure of 3 mmHg. Comparison(s): Prior images reviewed side by side. There has been significant improvement in LVEF in comparison with prior study. FINDINGS  Left Ventricle: Left ventricular ejection fraction, by estimation, is 45 to 50%. The left ventricle has mildly decreased function. Left ventricular endocardial border not optimally defined to evaluate regional wall motion. The left ventricular internal cavity size was normal in size. There is no left ventricular hypertrophy. Left ventricular diastolic parameters are indeterminate. Right Ventricle: The right ventricular size is normal. No increase in right ventricular wall thickness. Right ventricular systolic function is normal. There is normal pulmonary artery systolic pressure. The tricuspid regurgitant velocity is 2.12 m/s, and  with an assumed right atrial pressure of 3 mmHg, the estimated right ventricular systolic pressure is 65.7 mmHg. Left Atrium: Left atrial size was normal in size. Right Atrium: Right atrial size was normal in size. Pericardium: There is no evidence of pericardial effusion. Presence of epicardial fat layer. Mitral Valve: The mitral valve is grossly normal. Trivial mitral valve regurgitation. Tricuspid Valve: The tricuspid valve is grossly normal. Tricuspid valve regurgitation is mild. Aortic Valve: The aortic valve is tricuspid. There is mild to moderate aortic valve annular calcification. Aortic valve regurgitation is not visualized. Aortic valve sclerosis/calcification is present, without any evidence of aortic stenosis. Pulmonic Valve: The pulmonic valve was grossly normal. Pulmonic valve regurgitation is trivial. Aorta: The aortic root is normal in size and structure and aortic  dilatation noted. There is mild dilatation of the ascending aorta, measuring 39 mm. Venous: The inferior vena cava is normal in size with greater than 50% respiratory variability, suggesting right atrial pressure of 3 mmHg. IAS/Shunts: No atrial level shunt detected by color flow Doppler.  LEFT VENTRICLE PLAX 2D LVIDd:         5.10 cm LVIDs:         4.30 cm LV PW:         1.00 cm LV IVS:        1.00 cm LVOT diam:     1.90 cm LVOT Area:     2.84 cm  RIGHT VENTRICLE             IVC RV S prime:     14.90 cm/s  IVC diam: 1.10 cm LEFT ATRIUM             Index        RIGHT ATRIUM           Index LA diam:        3.10 cm 1.58 cm/m   RA Area:     13.80 cm LA Vol (A2C):   52.1 ml 26.47 ml/m  RA Volume:   33.80 ml  17.17 ml/m LA  Vol (A4C):   44.3 ml 22.51 ml/m LA Biplane Vol: 48.4 ml 24.59 ml/m   AORTA Ao Root diam: 2.90 cm Ao Asc diam:  3.90 cm TRICUSPID VALVE TR Peak grad:   18.0 mmHg TR Vmax:        212.00 cm/s  SHUNTS Systemic Diam: 1.90 cm Rozann Lesches MD Electronically signed by Rozann Lesches MD Signature Date/Time: 03/07/2021/1:53:15 PM    Final     Subjective: No complains  Discharge Exam: Vitals:   03/08/21 2134 03/09/21 0630  BP: (!) 143/86 (!) 146/73  Pulse: 82 69  Resp: 17 18  Temp: 98.1 F (36.7 C) 97.9 F (36.6 C)  SpO2: 96% 100%   Vitals:   03/08/21 1526 03/08/21 2020 03/08/21 2134 03/09/21 0630  BP:   (!) 143/86 (!) 146/73  Pulse:   82 69  Resp:   17 18  Temp:   98.1 F (36.7 C) 97.9 F (36.6 C)  TempSrc:      SpO2: 97% 97% 96% 100%  Weight:      Height:        General: Pt is alert, awake, not in acute distress Cardiovascular: RRR, S1/S2 +, no rubs, no gallops Respiratory: CTA bilaterally, no wheezing, no rhonchi Abdominal: Soft, NT, ND, bowel sounds + Extremities: no edema, no cyanosis    The results of significant diagnostics from this hospitalization (including imaging, microbiology, ancillary and laboratory) are listed below for reference.      Microbiology: Recent Results (from the past 240 hour(s))  Urine Culture     Status: Abnormal   Collection Time: 03/06/21  1:51 PM   Specimen: Urine, Clean Catch  Result Value Ref Range Status   Specimen Description   Final    URINE, CLEAN CATCH Performed at [facility], [address] [place], [place] [address]    Special Requests   Final    NONE Performed at [facility], [name], [place],  [address]    Culture MULTIPLE SPECIES PRESENT, SUGGEST RECOLLECTION (A)  Final   Report Status 03/08/2021 FINAL  Final  Resp Panel by RT-PCR (Flu A&B, Covid) Nasopharyngeal Swab     Status: None   Collection Time: 03/06/21  1:53 PM   Specimen: Nasopharyngeal Swab; Nasopharyngeal(NP) swabs in vial transport medium  Result Value Ref Range Status   SARS Coronavirus 2 by RT PCR NEGATIVE NEGATIVE Final    Comment: (NOTE) SARS-CoV-2 target nucleic acids are NOT DETECTED.  The SARS-CoV-2 RNA is generally detectable in upper respiratory specimens during the acute phase of infection. The lowest concentration of SARS-CoV-2 viral copies this assay can detect is 138 copies/mL. A negative result does not preclude SARS-Cov-2 infection and should not be used as the sole basis for treatment or other patient management decisions. A negative result may occur with  improper specimen collection/handling, submission of specimen other than nasopharyngeal swab, presence of viral mutation(s) within the areas targeted by this assay, and inadequate number of viral copies(<138 copies/mL). A negative result must be combined with clinical observations, patient history, and epidemiological information. The expected result is Negative.  Fact Sheet for Patients:  EntrepreneurPulse.com.au  Fact Sheet for Healthcare Providers:  IncredibleEmployment.be  This test is no t yet approved or cleared by the [place] FDA and  has been authorized for detection and/or  diagnosis of SARS-CoV-2 by FDA under an Emergency Use Authorization (EUA). This EUA will remain  in effect (meaning this test can be used) for the duration of the COVID-19 declaration under  Section 564(b)(1) of the Act, 21 U.S.C.section 360bbb-3(b)(1), unless the authorization is terminated  or revoked sooner.       Influenza A by PCR NEGATIVE NEGATIVE Final   Influenza B by PCR NEGATIVE NEGATIVE Final    Comment: (NOTE) The Xpert Xpress SARS-CoV-2/FLU/RSV plus assay is intended as an aid in the diagnosis of influenza from Nasopharyngeal swab specimens and should not be used as a sole basis for treatment. Nasal washings and aspirates are unacceptable for Xpert Xpress SARS-CoV-2/FLU/RSV testing.  Fact Sheet for Patients: EntrepreneurPulse.com.au  Fact Sheet for Healthcare Providers: IncredibleEmployment.be  This test is not yet approved or cleared by the [place] FDA and has been authorized for detection and/or diagnosis of SARS-CoV-2 by FDA under an Emergency Use Authorization (EUA). This EUA will remain in effect (meaning this test can be used) for the duration of the COVID-19 declaration under Section 564(b)(1) of the Act, 21 U.S.C. section 360bbb-3(b)(1), unless the authorization is terminated or revoked.  Performed at [facility], [address], [place], [place] [address]   Blood culture (routine x 2)     Status: None (Preliminary result)   Collection Time: 03/06/21  4:58 PM   Specimen: BLOOD LEFT HAND  Result Value Ref Range Status   Specimen Description BLOOD LEFT HAND  Final   Special Requests   Final    BOTTLES DRAWN AEROBIC AND ANAEROBIC Blood Culture adequate volume   Culture   Final    NO GROWTH 2 DAYS Performed at [facility], [address] [place], [place] [address]    Report Status PENDING  Incomplete  Blood culture (routine x 2)     Status: None (Preliminary result)   Collection Time: 03/06/21  5:03 PM    Specimen: BLOOD RIGHT WRIST  Result Value Ref Range Status   Specimen Description BLOOD RIGHT WRIST  Final   Special Requests   Final    BOTTLES DRAWN AEROBIC AND ANAEROBIC Blood Culture results may not be optimal due to an inadequate volume of blood received in culture bottles   Culture   Final    NO GROWTH 2 DAYS Performed at [facility], [address] [place], [place] [address]    Report Status PENDING  Incomplete     Labs: BNP (last 3 results) Recent Labs    03/06/21 1334  BNP 35.3   Basic Metabolic Panel: Recent Labs  Lab 03/06/21 1142 03/07/21 0011 03/08/21 0511 03/09/21 0618  NA 134* 134* 140 139  K 4.2 3.0* 4.0 3.9  CL 97* 101 105 105  CO2 23 23 26 25   GLUCOSE 134* 152* 126* 109*  BUN 16 15 10 9   CREATININE 1.33* 1.31* 1.17* 1.04*  CALCIUM 9.9 8.5* 9.1 9.3   Liver Function Tests: Recent Labs  Lab 03/06/21 1334  AST 39  ALT 29  ALKPHOS 50  BILITOT 0.8  PROT 7.7  ALBUMIN 4.1   No results for input(s): LIPASE, AMYLASE in the last 168 hours. No results for input(s): AMMONIA in the last 168 hours. CBC: Recent Labs  Lab 03/06/21 1142 03/07/21 0011 03/07/21 0602  WBC 15.1* 13.1* 10.2  NEUTROABS  --   --  6.0  HGB 14.4 11.7* 11.7*  HCT 42.8 34.6* 35.1*  MCV 89.5 89.4 90.5  PLT 332 235 228   Cardiac Enzymes: No results for input(s): CKTOTAL, CKMB, CKMBINDEX, TROPONINI in the last 168 hours. BNP: Invalid input(s): POCBNP CBG: Recent Labs  Lab 03/07/21 0016 03/07/21 0413 03/07/21 0710  GLUCAP 152* 148*  125*   D-Dimer No results for input(s): DDIMER in the last 72 hours. Hgb A1c No results for input(s): HGBA1C in the last 72 hours. Lipid Profile No results for input(s): CHOL, HDL, LDLCALC, TRIG, CHOLHDL, LDLDIRECT in the last 72 hours. Thyroid function studies Recent Labs    03/06/21 1659  TSH 0.803   Anemia work up No results for input(s): VITAMINB12, FOLATE, FERRITIN, TIBC, IRON, RETICCTPCT in the last 72 hours. Urinalysis     Component Value Date/Time   COLORURINE STRAW (A) 03/06/2021 1351   APPEARANCEUR HAZY (A) 03/06/2021 1351   APPEARANCEUR Clear 07/15/2020 1006   LABSPEC 1.005 03/06/2021 1351   PHURINE 7.0 03/06/2021 1351   GLUCOSEU NEGATIVE 03/06/2021 1351   HGBUR NEGATIVE 03/06/2021 1351   BILIRUBINUR NEGATIVE 03/06/2021 1351   BILIRUBINUR Negative 07/15/2020 1006   KETONESUR NEGATIVE 03/06/2021 1351   PROTEINUR NEGATIVE 03/06/2021 1351   UROBILINOGEN 0.2 07/31/2013 1442   NITRITE NEGATIVE 03/06/2021 1351   LEUKOCYTESUR TRACE (A) 03/06/2021 1351   Sepsis Labs Invalid input(s): PROCALCITONIN,  WBC,  LACTICIDVEN Microbiology Recent Results (from the past 240 hour(s))  Urine Culture     Status: Abnormal   Collection Time: 03/06/21  1:51 PM   Specimen: Urine, Clean Catch  Result Value Ref Range Status   Specimen Description   Final    URINE, CLEAN CATCH Performed at [facility], [address], [place], [place] [address]    Special Requests   Final    NONE Performed at [facility], [address] [place], [place] [address]    Culture MULTIPLE SPECIES PRESENT, SUGGEST RECOLLECTION (A)  Final   Report Status 03/08/2021 FINAL  Final  Resp Panel by RT-PCR (Flu A&B, Covid) Nasopharyngeal Swab     Status: None   Collection Time: 03/06/21  1:53 PM   Specimen: Nasopharyngeal Swab; Nasopharyngeal(NP) swabs in vial transport medium  Result Value Ref Range Status   SARS Coronavirus 2 by RT PCR NEGATIVE NEGATIVE Final    Comment: (NOTE) SARS-CoV-2 target nucleic acids are NOT DETECTED.  The SARS-CoV-2 RNA is generally detectable in upper respiratory specimens during the acute phase of infection. The lowest concentration of SARS-CoV-2 viral copies this assay can detect is 138 copies/mL. A negative result does not preclude SARS-Cov-2 infection and should not be used as the sole basis for treatment or other patient management decisions. A negative result may occur with  improper specimen  collection/handling, submission of specimen other than nasopharyngeal swab, presence of viral mutation(s) within the areas targeted by this assay, and inadequate number of viral copies(<138 copies/mL). A negative result must be combined with clinical observations, patient history, and epidemiological information. The expected result is Negative.  Fact Sheet for Patients:  EntrepreneurPulse.com.au  Fact Sheet for Healthcare Providers:  IncredibleEmployment.be  This test is no t yet approved or cleared by the [place] FDA and  has been authorized for detection and/or diagnosis of SARS-CoV-2 by FDA under an Emergency Use Authorization (EUA). This EUA will remain  in effect (meaning this test can be used) for the duration of the COVID-19 declaration under Section 564(b)(1) of the Act, 21 U.S.C.section 360bbb-3(b)(1), unless the authorization is terminated  or revoked sooner.       Influenza A by PCR NEGATIVE NEGATIVE Final   Influenza B by PCR NEGATIVE NEGATIVE Final    Comment: (NOTE) The Xpert Xpress SARS-CoV-2/FLU/RSV plus assay is intended as an aid in the diagnosis of influenza from Nasopharyngeal swab specimens and should not be used as  a sole basis for treatment. Nasal washings and aspirates are unacceptable for Xpert Xpress SARS-CoV-2/FLU/RSV testing.  Fact Sheet for Patients: EntrepreneurPulse.com.au  Fact Sheet for Healthcare Providers: IncredibleEmployment.be  This test is not yet approved or cleared by the [place] FDA and has been authorized for detection and/or diagnosis of SARS-CoV-2 by FDA under an Emergency Use Authorization (EUA). This EUA will remain in effect (meaning this test can be used) for the duration of the COVID-19 declaration under Section 564(b)(1) of the Act, 21 U.S.C. section 360bbb-3(b)(1), unless the authorization is terminated or revoked.  Performed at [facility], [address], [place], [place] [address]   Blood culture (routine x 2)     Status: None (Preliminary result)   Collection Time: 03/06/21  4:58 PM   Specimen: BLOOD LEFT HAND  Result Value Ref Range Status   Specimen Description BLOOD LEFT HAND  Final   Special Requests   Final    BOTTLES DRAWN AEROBIC AND ANAEROBIC Blood Culture adequate volume   Culture   Final    NO GROWTH 2 DAYS Performed at [facility], [address], [place], [place] [address]    Report Status PENDING  Incomplete  Blood culture (routine x 2)     Status: None (Preliminary result)   Collection Time: 03/06/21  5:03 PM   Specimen: BLOOD RIGHT WRIST  Result Value Ref Range Status   Specimen Description BLOOD RIGHT WRIST  Final   Special Requests   Final    BOTTLES DRAWN AEROBIC AND ANAEROBIC Blood Culture results may not be optimal due to an inadequate volume of blood received in culture bottles   Culture   Final    NO GROWTH 2 DAYS Performed at [facility], [address] [place], [place] [address]    Report Status PENDING  Incomplete      SIGNED:   Charlynne Cousins, MD  [facility] 03/09/2021, 7:38 AM Pager   If 7PM-7AM, please contact night-coverage [email or web address] Password [email or web address]

## 2021-03-10 ENCOUNTER — Ambulatory Visit: Payer: Medicare Other | Admitting: Family

## 2021-03-11 ENCOUNTER — Telehealth: Payer: Self-pay

## 2021-03-11 LAB — CULTURE, BLOOD (ROUTINE X 2)
Culture: NO GROWTH
Culture: NO GROWTH
Special Requests: ADEQUATE

## 2021-03-11 NOTE — Telephone Encounter (Signed)
Transition Care Management Follow-up Telephone Call Date of discharge and from where: 03/09/21 [facility] How have you been since you were released from the hospital? Doing a lot better Any questions or concerns? No  Items Reviewed: Did the pt receive and understand the discharge instructions provided? Yes  Medications obtained and verified? Yes  Other? No  Any new allergies since your discharge? No  Dietary orders reviewed? Yes Do you have support at home? Yes   Home Care and Equipment/Supplies: Were home health services ordered? no If so, what is the name of the agency? N/A  Has the agency set up a time to come to the patient's home? not applicable Were any new equipment or medical supplies ordered?  No What is the name of the medical supply agency? N/A Were you able to get the supplies/equipment? not applicable Do you have any questions related to the use of the equipment or supplies? No  Functional Questionnaire: (I = Independent and D = Dependent) ADLs: D  Bathing/Dressing- D  Meal Prep- D  Eating- I  Maintaining continence- I  Transferring/Ambulation- D - walker and someone to monitor and assist at times  Managing Meds- D  Follow up appointments reviewed:  PCP Hospital f/u appt confirmed? Yes  Scheduled to see [name] on 03/18/21 @ 11:10. [facility] f/u appt confirmed? No  waiting on call back from cardiology Are transportation arrangements needed? No  If their condition worsens, is the pt aware to call PCP or go to the Emergency Dept.? Yes Was the patient provided with contact information for the PCP's office or ED? Yes Was to pt encouraged to call back with questions or concerns? Yes

## 2021-03-18 ENCOUNTER — Encounter: Payer: Self-pay | Admitting: Family

## 2021-03-18 ENCOUNTER — Ambulatory Visit (INDEPENDENT_AMBULATORY_CARE_PROVIDER_SITE_OTHER): Payer: Commercial Managed Care - HMO | Admitting: Family

## 2021-03-18 VITALS — BP 133/81 | HR 73 | Temp 97.0°F | Resp 95 | Ht 59.0 in | Wt 232.0 lb

## 2021-03-18 DIAGNOSIS — I10 Essential (primary) hypertension: Secondary | ICD-10-CM | POA: Diagnosis not present

## 2021-03-18 DIAGNOSIS — F411 Generalized anxiety disorder: Secondary | ICD-10-CM

## 2021-03-18 DIAGNOSIS — Z09 Encounter for follow-up examination after completed treatment for conditions other than malignant neoplasm: Secondary | ICD-10-CM

## 2021-03-18 DIAGNOSIS — R6510 Systemic inflammatory response syndrome (SIRS) of non-infectious origin without acute organ dysfunction: Secondary | ICD-10-CM

## 2021-03-18 DIAGNOSIS — N1832 Chronic kidney disease, stage 3b: Secondary | ICD-10-CM

## 2021-03-18 DIAGNOSIS — F331 Major depressive disorder, recurrent, moderate: Secondary | ICD-10-CM

## 2021-03-18 DIAGNOSIS — M810 Age-related osteoporosis without current pathological fracture: Secondary | ICD-10-CM

## 2021-03-18 MED ORDER — DENOSUMAB 60 MG/ML ~~LOC~~ SOSY
60.0000 mg | PREFILLED_SYRINGE | Freq: Once | SUBCUTANEOUS | Status: AC
  Administered 2021-03-18: 12:00:00 60 mg via SUBCUTANEOUS

## 2021-03-18 MED ORDER — BUSPIRONE HCL 5 MG PO TABS: 5.0000 mg | ORAL_TABLET | Freq: Three times a day (TID) | ORAL | 1 refills | Status: DC | PRN

## 2021-03-18 MED ORDER — DESVENLAFAXINE SUCCINATE ER 100 MG PO TB24: 100.0000 mg | ORAL_TABLET | Freq: Every day | ORAL | 1 refills | Status: DC

## 2021-03-18 NOTE — Patient Instructions (Signed)
Major Depressive Disorder, Adult °Major depressive disorder (MDD) is a mental health condition. It may also be called clinical depression or unipolar depression. MDD causes symptoms of sadness, hopelessness, and loss of interest in things. These symptoms last most of the day, almost every day, for 2 weeks. MDD can also cause physical symptoms. It can interfere with relationships and with everyday activities, such as work, school, and activities that are usually pleasant. °MDD may be mild, moderate, or severe. It may be single-episode MDD, which happens once, or recurrent MDD, which may occur multiple times. °What are the causes? °The exact cause of this condition is not known. MDD is most likely caused by a combination of things, which may include: °Your personality traits. °Learned or conditioned behaviors or thoughts or feelings that reinforce negativity. °Any alcohol or substance misuse. °Long-term (chronic) physical or mental health illness. °Going through a traumatic experience or major life changes. °What increases the risk? °The following factors may make someone more likely to develop MDD: °A family history of depression. °Being a woman. °Troubled family relationships. °Abnormally low levels of certain brain chemicals. °Traumatic or painful events in childhood, especially abuse or loss of a parent. °A lot of stress from life experiences, such as poor living conditions or discrimination. °Chronic physical illness or other mental health disorders. °What are the signs or symptoms? °The main symptoms of MDD usually include: °Constant depressed or irritable mood. °A loss of interest in things and activities. °Other symptoms include: °Sleeping or eating too much or too little. °Unexplained weight gain or weight loss. °Tiredness or low energy. °Being agitated, restless, or weak. °Feeling hopeless, worthless, or guilty. °Trouble thinking clearly or making decisions. °Thoughts of suicide or thoughts of harming  others. °Isolating oneself or avoiding other people or activities. °Trouble completing tasks, work, or any normal obligations. °Severe symptoms of this condition may include: °Psychotic depression.This may include false beliefs, or delusions. It may also include seeing, hearing, tasting, smelling, or feeling things that are not real (hallucinations). °Chronic depression or persistent depressive disorder. This is low-level depression that lasts for at least 2 years. °Melancholic depression, or feeling extremely sad and hopeless. °Catatonic depression, which includes trouble speaking and trouble moving. °How is this diagnosed? °This condition may be diagnosed based on: °Your symptoms. °Your medical and mental health history. You may be asked questions about your lifestyle, including any drug and alcohol use. °A physical exam. °Blood tests to rule out other conditions. °MDD is confirmed if you have the following symptoms most of the day, nearly every day, in a 2-week period: °Either a depressed mood or loss of interest. °At least four other MDD symptoms. °How is this treated? °This condition is usually treated by mental health professionals, such as psychologists, psychiatrists, and clinical social workers. You may need more than one type of treatment. Treatment may include: °Psychotherapy, also called talk therapy or counseling. Types of psychotherapy include: °Cognitive behavioral therapy (CBT). This teaches you to recognize unhealthy feelings, thoughts, and behaviors, and replace them with positive thoughts and actions. °Interpersonal therapy (IPT). This helps you to improve the way you communicate with others or relate to them. °Family therapy. This treatment includes members of your family. °Medicines to treat anxiety and depression. These medicines help to balance the brain chemicals that affect your emotions. °Lifestyle changes. You may be asked to: °Limit alcohol use and avoid drug use. °Get regular  exercise. °Get plenty of sleep. °Make healthy eating choices. °Spend more time outdoors. °Brain stimulation. This may   be done if symptoms are very severe and other treatments have not worked. Examples of this treatment are electroconvulsive therapy and transcranial magnetic stimulation. °Follow these instructions at home: °Activity °Exercise regularly and spend time outdoors. °Find activities that you enjoy doing, and make time to do them. °Find healthy ways to manage stress, such as: °Meditation or deep breathing. °Spending time in nature. °Journaling. °Return to your normal activities as told by your health care provider. Ask your health care provider what activities are safe for you. °Alcohol and drug use °If you drink alcohol: °Limit how much you use to: °0-1 drink a day for women who are not pregnant. °0-2 drinks a day for men. °Be aware of how much alcohol is in your drink. In the [place] one drink equals one 12 oz bottle of beer (355 mL), one 5 oz glass of wine (148 mL), or one 1½ oz glass of hard liquor (44 mL). °Discuss your alcohol use with your health care provider. Alcohol can affect any antidepressant medicines you are taking. °Discuss any drug use with your health care provider. °General instructions ° °Take over-the-counter and prescription medicines only as told by your health care provider. °Eat a healthy diet and get plenty of sleep. °Consider joining a support group. Your health care provider may be able to recommend one. °Keep all follow-up visits as told by your health care provider. This is important. °Where to find more information °National Alliance on Mental Illness: www.nami.org °[place] National Institute of Mental Health: www.nimh.nih.gov °Contact a health care provider if: °Your symptoms get worse. °You develop new symptoms. °Get help right away if: °You self-harm. °You have serious thoughts about hurting yourself or others. °You hallucinate. °If you ever feel like you may hurt yourself or  others, or have thoughts about taking your own life, get help right away. Go to your nearest emergency department or: °Call your local emergency services (911 in the [place] °Call a suicide crisis helpline, such as the National Suicide Prevention Lifeline at 1-800-273-8255 or 988 in the [place] This is open 24 hours a day in the [place] °Text the Crisis Text Line at 741741 (in the [place] °Summary °Major depressive disorder (MDD) is a mental health condition. MDD causes symptoms of sadness, hopelessness, and loss of interest in things. These symptoms last most of the day, almost every day, for 2 weeks. °The symptoms of MDD can interfere with relationships and with everyday activities. °Treatments and support are available for people who develop MDD. You may need more than one type of treatment. °Get help right away if you have serious thoughts about hurting yourself or others. °This information is not intended to replace advice given to you by your health care provider. Make sure you discuss any questions you have with your health care provider. °Document Revised: 09/04/2020 Document Reviewed: 01/21/2019 °Elsevier Patient Education © 2022 Elsevier Inc. ° °

## 2021-03-18 NOTE — Progress Notes (Signed)
Subjective:    Patient ID: Sheila [name], female    DOB: [birth year], 81 y.o.   MRN: [id number]  Chief Complaint  Patient presents with   Transitions Of Care   Today's visit was for Transitional Care Management.  The patient was discharged from [facility] on 03/09/21 with a primary diagnosis of SIRs.   Contact with the patient and/or caregiver, by a clinical staff member, was made on 03/11/21 and was documented as a telephone encounter within the EMR.  Through chart review and discussion with the patient I have determined that management of their condition is of moderate complexity.   She went to the ED on 03/06/21 with weakness. She was treated for a UTI. Also her creatinine was elevated from baseline. She had an EF of 45%. She was started on low dose beta blocker.   She reports she is feeling better and doing well.   She wants to change her Cymbalta as she feels like her depression and GAD are not well controlled.  Hypertension This is a chronic problem. The current episode started more than 1 year ago. The problem has been resolved since onset. The problem is controlled. Associated symptoms include anxiety, malaise/fatigue and shortness of breath. Pertinent negatives include no peripheral edema. Risk factors for coronary artery disease include dyslipidemia, obesity and sedentary lifestyle. The current treatment provides moderate improvement.  Depression        This is a chronic problem.  The current episode started more than 1 year ago.   The problem occurs intermittently.  Associated symptoms include fatigue, helplessness, hopelessness, restlessness, decreased interest and sad.  Past treatments include SNRIs - Serotonin and norepinephrine reuptake inhibitors.  Past medical history includes anxiety.   Anxiety Presents for follow-up visit. Symptoms include depressed mood, excessive worry, irritability, nervous/anxious behavior, restlessness and shortness of breath.       Review of  Systems  Constitutional:  Positive for fatigue, irritability and malaise/fatigue.  Respiratory:  Positive for shortness of breath.   Psychiatric/Behavioral:  Positive for depression. The patient is nervous/anxious.   All other systems reviewed and are negative.     Objective:   Physical Exam Vitals reviewed.  Constitutional:      General: She is not in acute distress.    Appearance: She is well-developed. She is obese.  HENT:     Head: Normocephalic and atraumatic.     Right Ear: Tympanic membrane normal.     Left Ear: Tympanic membrane normal.  Eyes:     Pupils: Pupils are equal, round, and reactive to light.  Neck:     Thyroid: No thyromegaly.  Cardiovascular:     Rate and Rhythm: Normal rate and regular rhythm.     Heart sounds: Normal heart sounds. No murmur heard. Pulmonary:     Effort: Pulmonary effort is normal. No respiratory distress.     Breath sounds: Wheezing present.  Abdominal:     General: Bowel sounds are normal. There is no distension.     Palpations: Abdomen is soft.     Tenderness: There is no abdominal tenderness.  Musculoskeletal:        General: No tenderness. Normal range of motion.     Cervical back: Normal range of motion and neck supple.  Skin:    General: Skin is warm and dry.  Neurological:     Mental Status: She is alert and oriented to person, place, and time.     Cranial Nerves: No cranial nerve deficit.  Deep Tendon Reflexes: Reflexes are normal and symmetric.  Psychiatric:        Behavior: Behavior normal.        Thought Content: Thought content normal.        Judgment: Judgment normal.      BP 133/81    Pulse 73    Temp (!) 97 F (36.1 C) (Temporal)    Resp (!) 95    Ht $R'4\' 11"'OJ$  (1.499 m)    Wt 232 lb (105.2 kg)    BMI 46.86 kg/m      Assessment & Plan:  CALYN SIVILS comes in today with chief complaint of Transitions Of Care   Diagnosis and orders addressed:  1. Essential hypertension, benign - CMP14+EGFR - CBC with  Differential/Platelet  2. Stage 3b chronic kidney disease ([place]) - CMP14+EGFR - CBC with Differential/Platelet  3. GAD (generalized anxiety disorder) - desvenlafaxine (PRISTIQ) 100 MG 24 hr tablet; Take 1 tablet (100 mg total) by mouth daily.  Dispense: 90 tablet; Refill: 1 - busPIRone (BUSPAR) 5 MG tablet; Take 1 tablet (5 mg total) by mouth 3 (three) times daily as needed.  Dispense: 90 tablet; Refill: 1 - CMP14+EGFR - CBC with Differential/Platelet  4. Moderate episode of recurrent major depressive disorder (HCC) Will stop Cymbalta and start Pristiq and Buspar 5 mg TID prn  - desvenlafaxine (PRISTIQ) 100 MG 24 hr tablet; Take 1 tablet (100 mg total) by mouth daily.  Dispense: 90 tablet; Refill: 1 - busPIRone (BUSPAR) 5 MG tablet; Take 1 tablet (5 mg total) by mouth 3 (three) times daily as needed.  Dispense: 90 tablet; Refill: 1 - CMP14+EGFR - CBC with Differential/Platelet  5. Morbid obesity ([place]) - CMP14+EGFR - CBC with Differential/Platelet  6. Hospital discharge follow-up - CMP14+EGFR - CBC with Differential/Platelet  7. SIRS (systemic inflammatory response syndrome) (HCC) - CMP14+EGFR - CBC with Differential/Platelet  8. Osteoporosis, unspecified osteoporosis type, unspecified pathological fracture presence - denosumab (PROLIA) injection 60 mg   Labs pending Health Maintenance reviewed Diet and exercise encouraged  Follow up plan: 3  months   [name], FNP

## 2021-03-19 ENCOUNTER — Other Ambulatory Visit: Payer: Self-pay | Admitting: Family

## 2021-03-19 DIAGNOSIS — G629 Polyneuropathy, unspecified: Secondary | ICD-10-CM

## 2021-03-19 LAB — CBC WITH DIFFERENTIAL/PLATELET
Basophils Absolute: 0.1 10*3/uL (ref 0.0–0.2)
Basos: 1 %
EOS (ABSOLUTE): 0.5 10*3/uL — ABNORMAL HIGH (ref 0.0–0.4)
Eos: 4 %
Hematocrit: 41.2 % (ref 34.0–46.6)
Hemoglobin: 13.8 g/dL (ref 11.1–15.9)
Immature Grans (Abs): 0 10*3/uL (ref 0.0–0.1)
Immature Granulocytes: 0 %
Lymphocytes Absolute: 5 10*3/uL — ABNORMAL HIGH (ref 0.7–3.1)
Lymphs: 39 %
MCH: 29.6 pg (ref 26.6–33.0)
MCHC: 33.5 g/dL (ref 31.5–35.7)
MCV: 88 fL (ref 79–97)
Monocytes Absolute: 0.9 10*3/uL (ref 0.1–0.9)
Monocytes: 7 %
Neutrophils Absolute: 6.4 10*3/uL (ref 1.4–7.0)
Neutrophils: 49 %
Platelets: 427 10*3/uL (ref 150–450)
RBC: 4.66 x10E6/uL (ref 3.77–5.28)
RDW: 13.2 % (ref 11.7–15.4)
WBC: 12.8 10*3/uL — ABNORMAL HIGH (ref 3.4–10.8)

## 2021-03-19 LAB — CMP14+EGFR
ALT: 19 IU/L (ref 0–32)
AST: 21 IU/L (ref 0–40)
Albumin/Globulin Ratio: 1.6 (ref 1.2–2.2)
Albumin: 4.7 g/dL (ref 3.7–4.7)
Alkaline Phosphatase: 63 IU/L (ref 44–121)
BUN/Creatinine Ratio: 18 (ref 12–28)
BUN: 27 mg/dL (ref 8–27)
Bilirubin Total: 0.6 mg/dL (ref 0.0–1.2)
CO2: 20 mmol/L (ref 20–29)
Calcium: 10.1 mg/dL (ref 8.7–10.3)
Chloride: 96 mmol/L (ref 96–106)
Creatinine, Ser: 1.52 mg/dL — ABNORMAL HIGH (ref 0.57–1.00)
Globulin, Total: 2.9 g/dL (ref 1.5–4.5)
Glucose: 137 mg/dL — ABNORMAL HIGH (ref 70–99)
Potassium: 4.8 mmol/L (ref 3.5–5.2)
Sodium: 136 mmol/L (ref 134–144)
Total Protein: 7.6 g/dL (ref 6.0–8.5)
eGFR: 35 mL/min/{1.73_m2} — ABNORMAL LOW (ref >59)

## 2021-03-25 ENCOUNTER — Telehealth: Payer: Self-pay | Admitting: Cardiovascular Disease

## 2021-03-25 NOTE — Telephone Encounter (Signed)
LMTCB 1/31.

## 2021-03-25 NOTE — Telephone Encounter (Signed)
Daughter calling to say that she needs dr berry to right later stating the patient cant be left alone. So that she can file fmla papers

## 2021-03-25 NOTE — Telephone Encounter (Signed)
Spoke with daughter Tova Vater who would like to have Dr. Gwenlyn Found provide documentation that patient cannot be left alone. Her sister Angelena Form would be the daughter who would spend time taking care of patient and would ask for FMLA. Please advise.

## 2021-03-26 NOTE — Telephone Encounter (Signed)
LMTCB 2/1.

## 2021-03-27 ENCOUNTER — Ambulatory Visit: Payer: Medicare Other | Admitting: Family

## 2021-03-31 NOTE — Telephone Encounter (Signed)
LMTCB regarding FMLA papers.

## 2021-04-01 ENCOUNTER — Other Ambulatory Visit: Payer: Self-pay | Admitting: Family

## 2021-04-01 DIAGNOSIS — L282 Other prurigo: Secondary | ICD-10-CM

## 2021-04-02 ENCOUNTER — Telehealth: Payer: Self-pay | Admitting: Family

## 2021-04-02 MED ORDER — SERTRALINE HCL 100 MG PO TABS: 100.0000 mg | ORAL_TABLET | Freq: Every day | ORAL | 1 refills | Status: DC

## 2021-04-02 NOTE — Telephone Encounter (Signed)
Stop Pristiq and start Zoloft. Prescription sent to pharmacy.

## 2021-04-02 NOTE — Addendum Note (Signed)
Addended by: [name] A on: 04/02/2021 03:11 PM   Modules accepted: Orders

## 2021-04-02 NOTE — Telephone Encounter (Signed)
Patients daughter aware and verbalized understanding.

## 2021-04-02 NOTE — Telephone Encounter (Signed)
duaghter states pristiq isnt going good, doesn't want to go on what she was on before, is interested in trying zoloft since daughter takes that and works good for daughter

## 2021-04-08 ENCOUNTER — Encounter: Payer: Self-pay | Admitting: Family

## 2021-04-08 ENCOUNTER — Ambulatory Visit (INDEPENDENT_AMBULATORY_CARE_PROVIDER_SITE_OTHER): Payer: Medicare Other | Admitting: Family

## 2021-04-08 VITALS — BP 152/101 | HR 119 | Temp 98.4°F | Ht 59.0 in | Wt 232.8 lb

## 2021-04-08 DIAGNOSIS — J441 Chronic obstructive pulmonary disease with (acute) exacerbation: Secondary | ICD-10-CM | POA: Diagnosis not present

## 2021-04-08 DIAGNOSIS — F331 Major depressive disorder, recurrent, moderate: Secondary | ICD-10-CM | POA: Diagnosis not present

## 2021-04-08 DIAGNOSIS — F411 Generalized anxiety disorder: Secondary | ICD-10-CM

## 2021-04-08 MED ORDER — DOXYCYCLINE HYCLATE 100 MG PO TABS: 100.0000 mg | ORAL_TABLET | Freq: Two times a day (BID) | ORAL | 0 refills | Status: DC

## 2021-04-08 MED ORDER — PREDNISONE 20 MG PO TABS: 40.0000 mg | ORAL_TABLET | Freq: Every day | ORAL | 0 refills | Status: AC

## 2021-04-08 MED ORDER — TRELEGY ELLIPTA 100-62.5-25 MCG/ACT IN AEPB: 1.0000 | INHALATION_SPRAY | Freq: Every day | RESPIRATORY_TRACT | 11 refills | Status: DC

## 2021-04-08 NOTE — Progress Notes (Signed)
Subjective:    Patient ID: Sheila [name], female    DOB: Jun 23, 1940, 81 y.o.   MRN: [id number]  Chief Complaint  Patient presents with   Follow-up   Cough    BEEN GOING ON LONGER THAN A WEEK    PT presents to the office today to recheck GAD and Depression. She was seen on 03/18/21 and we stopped her Cymbalta and started her on Pristiq and Buspar 5 mg TID prn.   She stated the Pristiq made her cry a lot and it was changed to zoloft. Her daughter is on this and requested we try this. Reports she is feeling better since starting the Zoloft.  Cough This is a new problem. The current episode started 1 to 4 weeks ago. The problem has been gradually worsening. The problem occurs every few minutes. The cough is Productive of sputum. Associated symptoms include myalgias, nasal congestion, postnasal drip, a sore throat, shortness of breath and wheezing. Pertinent negatives include no chills, ear congestion, ear pain, fever or headaches. The treatment provided mild relief. Her past medical history is significant for COPD.     Review of Systems  Constitutional:  Negative for chills and fever.  HENT:  Positive for postnasal drip and sore throat. Negative for ear pain.   Respiratory:  Positive for cough, shortness of breath and wheezing.   Musculoskeletal:  Positive for myalgias.  Neurological:  Negative for headaches.  All other systems reviewed and are negative.     Objective:   Physical Exam Vitals reviewed.  Constitutional:      General: She is not in acute distress.    Appearance: She is well-developed. She is obese.  HENT:     Head: Normocephalic and atraumatic.     Right Ear: Tympanic membrane normal.     Left Ear: Tympanic membrane normal.  Eyes:     Pupils: Pupils are equal, round, and reactive to light.  Neck:     Thyroid: No thyromegaly.  Cardiovascular:     Rate and Rhythm: Normal rate and regular rhythm.     Heart sounds: Normal heart sounds. No murmur heard. Pulmonary:      Effort: Pulmonary effort is normal. No respiratory distress.     Breath sounds: Rhonchi present. No wheezing.  Abdominal:     General: Bowel sounds are normal. There is no distension.     Palpations: Abdomen is soft.     Tenderness: There is no abdominal tenderness.  Musculoskeletal:        General: No tenderness. Normal range of motion.     Cervical back: Normal range of motion and neck supple.  Skin:    General: Skin is warm and dry.  Neurological:     Mental Status: She is alert and oriented to person, place, and time.     Cranial Nerves: No cranial nerve deficit.     Motor: Weakness present.     Gait: Gait abnormal.     Deep Tendon Reflexes: Reflexes are normal and symmetric.  Psychiatric:        Behavior: Behavior normal.        Thought Content: Thought content normal.        Judgment: Judgment normal.     BP (!) 152/101    Pulse (!) 119    Temp 98.4 F (36.9 C) (Temporal)    Ht 4\' 11"  (1.499 m)    Wt 232 lb 12.8 oz (105.6 kg)    SpO2 95%    BMI  47.02 kg/m       Assessment & Plan:  Sheila [name] comes in today with chief complaint of Follow-up and Cough (BEEN GOING ON LONGER THAN A WEEK )   Diagnosis and orders addressed:  1. COPD exacerbation (HCC) Start doxycycline and prednisone Will change Symbicort to Trelegy - doxycycline (VIBRA-TABS) 100 MG tablet; Take 1 tablet (100 mg total) by mouth 2 (two) times daily.  Dispense: 20 tablet; Refill: 0 - predniSONE (DELTASONE) 20 MG tablet; Take 2 tablets (40 mg total) by mouth daily with breakfast for 5 days.  Dispense: 10 tablet; Refill: 0 - Fluticasone-Umeclidin-Vilant (TRELEGY ELLIPTA) 100-62.5-25 MCG/ACT AEPB; Inhale 1 puff into the lungs daily.  Dispense: 1 each; Refill: 11  2. GAD (generalized anxiety disorder) Continue Zoloft   3. Moderate episode of recurrent major depressive disorder [facility])    Health Maintenance reviewed Diet and exercise encouraged  Follow up plan: 2 months for chronic follow  up   [name], FNP

## 2021-04-08 NOTE — Patient Instructions (Signed)
Acute Bronchitis, Adult °Acute bronchitis is sudden inflammation of the main airways (bronchi) that come off the windpipe (trachea) in the lungs. The swelling causes the airways to get smaller and make more mucus than normal. This can make it hard to breathe and can cause coughing or noisy breathing (wheezing). °Acute bronchitis may last several weeks. The cough may last longer. Allergies, asthma, and exposure to smoke may make the condition worse. °What are the causes? °This condition can be caused by germs and by substances that irritate the lungs, including: °Cold and flu viruses. The most common cause of this condition is the virus that causes the common cold. °Bacteria. This is less common. °Breathing in substances that irritate the lungs, including: °Smoke from cigarettes and other forms of tobacco. °Dust and pollen. °Fumes from household cleaning products, gases, or burned fuel. °Indoor or outdoor air pollution. °What increases the risk? °The following factors may make you more likely to develop this condition: °A weak body's defense system, also called the immune system. °A condition that affects your lungs and breathing, such as asthma. °What are the signs or symptoms? °Common symptoms of this condition include: °Coughing. This may bring up clear, yellow, or green mucus from your lungs (sputum). °Wheezing. °Runny or stuffy nose. °Having too much mucus in your lungs (chest congestion). °Shortness of breath. °Aches and pains, including sore throat or chest. °How is this diagnosed? °This condition is usually diagnosed based on: °Your symptoms and medical history. °A physical exam. °You may also have other tests, including tests to rule out other conditions, such as pneumonia. These tests include: °A test of lung function. °Test of a mucus sample to look for the presence of bacteria. °Tests to check the oxygen level in your blood. °Blood tests. °Chest X-ray. °How is this treated? °Most cases of acute bronchitis  clear up over time without treatment. Your health care provider may recommend: °Drinking more fluids to help thin your mucus so it is easier to cough up. °Taking inhaled medicine (inhaler) to improve air flow in and out of your lungs. °Using a vaporizer or a humidifier. These are machines that add water to the air to help you breathe better. °Taking a medicine that thins mucus and clears congestion (expectorant). °Taking a medicine that prevents or stops coughing (cough suppressant). °It is notcommon to take an antibiotic medicine for this condition. °Follow these instructions at home: ° °Take over-the-counter and prescription medicines only as told by your health care provider. °Use an inhaler, vaporizer, or humidifier as told by your health care provider. °Take two teaspoons (10 mL) of honey at bedtime to lessen coughing at night. °Drink enough fluid to keep your urine pale yellow. °Do not use any products that contain nicotine or tobacco. These products include cigarettes, chewing tobacco, and vaping devices, such as e-cigarettes. If you need help quitting, ask your health care provider. °Get plenty of rest. °Return to your normal activities as told by your health care provider. Ask your health care provider what activities are safe for you. °Keep all follow-up visits. This is important. °How is this prevented? °To lower your risk of getting this condition again: °Wash your hands often with soap and water for at least 20 seconds. If soap and water are not available, use hand sanitizer. °Avoid contact with people who have cold symptoms. °Try not to touch your mouth, nose, or eyes with your hands. °Avoid breathing in smoke or chemical fumes. Breathing smoke or chemical fumes will make your condition   worse. °Get the flu shot every year. °Contact a health care provider if: °Your symptoms do not improve after 2 weeks. °You have trouble coughing up the mucus. °Your cough keeps you awake at night. °You have a  fever. °Get help right away if you: °Cough up blood. °Feel pain in your chest. °Have severe shortness of breath. °Faint or keep feeling like you are going to faint. °Have a severe headache. °Have a fever or chills that get worse. °These symptoms may represent a serious problem that is an emergency. Do not wait to see if the symptoms will go away. Get medical help right away. Call your local emergency services (911 in the [place] Do not drive yourself to the hospital. °Summary °Acute bronchitis is inflammation of the main airways (bronchi) that come off the windpipe (trachea) in the lungs. The swelling causes the airways to get smaller and make more mucus than normal. °Drinking more fluids can help thin your mucus so it is easier to cough up. °Take over-the-counter and prescription medicines only as told by your health care provider. °Do not use any products that contain nicotine or tobacco. These products include cigarettes, chewing tobacco, and vaping devices, such as e-cigarettes. If you need help quitting, ask your health care provider. °Contact a health care provider if your symptoms do not improve after 2 weeks. °This information is not intended to replace advice given to you by your health care provider. Make sure you discuss any questions you have with your health care provider. °Document Revised: 06/12/2020 Document Reviewed: 06/12/2020 °Elsevier Patient Education © 2022 [employer] ° °

## 2021-04-09 ENCOUNTER — Telehealth: Payer: Self-pay | Admitting: Family

## 2021-04-09 NOTE — Telephone Encounter (Signed)
Spoke with patient and she states the inhaler is fine

## 2021-04-09 NOTE — Telephone Encounter (Signed)
Patient does not like the inhaler that was called in. She wants to know if something else can be called in. Please call back.

## 2021-04-10 ENCOUNTER — Telehealth: Payer: Self-pay | Admitting: Family

## 2021-04-11 MED ORDER — BREZTRI AEROSPHERE 160-9-4.8 MCG/ACT IN AERO: 2.0000 | INHALATION_SPRAY | Freq: Two times a day (BID) | RESPIRATORY_TRACT | 11 refills | Status: DC

## 2021-04-11 NOTE — Telephone Encounter (Signed)
Trelegy changed to [employer]. Prescription sent to pharmacy

## 2021-04-11 NOTE — Telephone Encounter (Signed)
Patient aware and verbalized understanding. °

## 2021-04-14 ENCOUNTER — Other Ambulatory Visit: Payer: Self-pay

## 2021-04-14 ENCOUNTER — Other Ambulatory Visit: Payer: Medicare Other

## 2021-04-14 DIAGNOSIS — Z515 Encounter for palliative care: Secondary | ICD-10-CM

## 2021-04-15 ENCOUNTER — Telehealth: Payer: Self-pay | Admitting: Family

## 2021-04-15 ENCOUNTER — Other Ambulatory Visit: Payer: Self-pay | Admitting: Family

## 2021-04-15 DIAGNOSIS — M549 Dorsalgia, unspecified: Secondary | ICD-10-CM

## 2021-04-15 DIAGNOSIS — G8929 Other chronic pain: Secondary | ICD-10-CM

## 2021-04-15 NOTE — Telephone Encounter (Signed)
Asking why she is only getting 30 pills of tramadol when when she takes every2  12 hours. And also they pharmacy is telling her we still need to send it in.

## 2021-04-15 NOTE — Telephone Encounter (Signed)
This was sent in January with refills. This was decreased because she can not take Ultram and xanax together. Per patient she stated she does not take Ultram regularly and only as needed for increased pain.

## 2021-04-15 NOTE — Progress Notes (Signed)
PATIENT NAME: Sheila [name] DOB: 1940-08-07 MRN: [id number]  PRIMARY CARE PROVIDER: Sharion Balloon, FNP  RESPONSIBLE PARTY:  Acct ID - Guarantor Home Phone Work Phone Relationship Acct Type  [id number] ELAYNE, GRUVER [phone]  Self P/F     [address], [place], [place] [address]   Due to the COVID-19 crisis, this virtual check-in visit was done via telephone from my office and it was initiated and consent by this patient and or family. I connected with Valere Dross OR PROXY on 04/14/21 by telephone and verified that I am speaking with the correct person using two identifiers.   I discussed the limitations of evaluation and management by telemedicine. The patient expressed understanding and agreed to proceed.  COMMUNITY PALLIATIVE CARE RN NOTE     PLAN OF CARE and INTERVENTION:  ADVANCE CARE PLANNING/GOALS OF CARE: comfort, would like to stay in her home CAREGIVER EDUCATION: Safety, Palliative care DISEASE STATUS: RN Palliative care telephonic encounter completed with patient today. Patient verbally engaging. Denies any pain or shortness of breath. She shared that she was started on a new inhaler that she has found helpful. Her anxiety has been managed with the increase of her Xanax. Patient has caregiver in her home. No recent falls reported. Plan is for NP to make an in person visit. HISTORY OF PRESENT ILLNESS: This is a 81- year-old female with a diagnosis of COPD. She has a history of hypertension, non-ischemic cardiomyopathy, hyperlipidemia, and peptic ulcer disease. Palliative care has been asked to follow for additional support, care and complex decision making.   CODE STATUS: DNR PPS: 50% Physical Exam: deferred.       Duration: 40 min       Dietrich Pates, Producer, television/film/video OF CARE and INTERVENTION:  ADVANCE CARE PLANNING/GOALS OF CARE: comfort, would like to stay in her home CAREGIVER EDUCATION: Safety, Palliative care DISEASE STATUS: RN Palliative care telephonic  encounter completed with patient today. Patient verbally engaging. Denies any pain or shortness of breath. She shared that she was started on a new inhaler that she has found helpful. Her anxiety has been managed with the increase of her Xanax. Patient has caregiver in her home. No recent falls reported. Plan is for NP to make an in person visit. HISTORY OF PRESENT ILLNESS: This is a 40- year-old female with a diagnosis of COPD. She has a history of hypertension, non-ischemic cardiomyopathy, hyperlipidemia, and peptic ulcer disease. Palliative care has been asked to follow for additional support, care and complex decision making.   CODE STATUS: DNR PPS: 50% Physical Exam: deferred.       Duration: 40 min       Dietrich Pates, BSN    Cornelius Moras, RN

## 2021-04-15 NOTE — Telephone Encounter (Signed)
Called and spoke with daughter daughter states she is not aware of any of this her and Lindalou and caregiver going to get together to see how patient actually takes medications. FYI she will call back. States she just  needs to switched pcp advised that no provider will give her that mix.

## 2021-04-21 ENCOUNTER — Other Ambulatory Visit: Payer: Self-pay | Admitting: Nurse Practitioner

## 2021-04-22 ENCOUNTER — Telehealth: Payer: Self-pay

## 2021-04-22 NOTE — Telephone Encounter (Signed)
1230 pm.  Return call made to [facility].  Patient is complaining of a headache.  Crystal states they are out of tramadol and is asking if patient can take something over the counter.  Advised that I would follow up with Sheila Hippo, NP.

## 2021-04-24 ENCOUNTER — Other Ambulatory Visit: Payer: Medicare Other | Admitting: Family Medicine

## 2021-04-24 ENCOUNTER — Encounter: Payer: Self-pay | Admitting: Family

## 2021-04-24 ENCOUNTER — Ambulatory Visit (INDEPENDENT_AMBULATORY_CARE_PROVIDER_SITE_OTHER): Payer: Medicare Other | Admitting: Family

## 2021-04-24 VITALS — BP 109/66 | HR 86 | Temp 97.5°F | Ht 59.0 in | Wt 236.4 lb

## 2021-04-24 DIAGNOSIS — Z0289 Encounter for other administrative examinations: Secondary | ICD-10-CM

## 2021-04-24 DIAGNOSIS — M150 Primary generalized (osteo)arthritis: Secondary | ICD-10-CM

## 2021-04-24 DIAGNOSIS — I10 Essential (primary) hypertension: Secondary | ICD-10-CM

## 2021-04-24 DIAGNOSIS — F331 Major depressive disorder, recurrent, moderate: Secondary | ICD-10-CM

## 2021-04-24 DIAGNOSIS — E782 Mixed hyperlipidemia: Secondary | ICD-10-CM

## 2021-04-24 DIAGNOSIS — J449 Chronic obstructive pulmonary disease, unspecified: Secondary | ICD-10-CM | POA: Diagnosis not present

## 2021-04-24 DIAGNOSIS — M545 Low back pain, unspecified: Secondary | ICD-10-CM | POA: Diagnosis not present

## 2021-04-24 DIAGNOSIS — M159 Polyosteoarthritis, unspecified: Secondary | ICD-10-CM

## 2021-04-24 DIAGNOSIS — F411 Generalized anxiety disorder: Secondary | ICD-10-CM

## 2021-04-24 DIAGNOSIS — Z79899 Other long term (current) drug therapy: Secondary | ICD-10-CM | POA: Diagnosis not present

## 2021-04-24 DIAGNOSIS — Z23 Encounter for immunization: Secondary | ICD-10-CM

## 2021-04-24 DIAGNOSIS — M549 Dorsalgia, unspecified: Secondary | ICD-10-CM

## 2021-04-24 DIAGNOSIS — I5022 Chronic systolic (congestive) heart failure: Secondary | ICD-10-CM

## 2021-04-24 DIAGNOSIS — E039 Hypothyroidism, unspecified: Secondary | ICD-10-CM | POA: Diagnosis not present

## 2021-04-24 DIAGNOSIS — G8929 Other chronic pain: Secondary | ICD-10-CM

## 2021-04-24 DIAGNOSIS — N1832 Chronic kidney disease, stage 3b: Secondary | ICD-10-CM

## 2021-04-24 DIAGNOSIS — E210 Primary hyperparathyroidism: Secondary | ICD-10-CM

## 2021-04-24 DIAGNOSIS — Z79891 Long term (current) use of opiate analgesic: Secondary | ICD-10-CM | POA: Diagnosis not present

## 2021-04-24 DIAGNOSIS — F1320 Sedative, hypnotic or anxiolytic dependence, uncomplicated: Secondary | ICD-10-CM

## 2021-04-24 MED ORDER — ALPRAZOLAM 0.5 MG PO TABS: 0.5000 mg | ORAL_TABLET | Freq: Two times a day (BID) | ORAL | 2 refills | Status: DC | PRN

## 2021-04-24 MED ORDER — TRAMADOL HCL 50 MG PO TABS: 100.0000 mg | ORAL_TABLET | Freq: Two times a day (BID) | ORAL | 2 refills | Status: DC | PRN

## 2021-04-24 NOTE — Patient Instructions (Signed)

## 2021-04-24 NOTE — Progress Notes (Signed)
Subjective:    Patient ID: Sheila [name], female    DOB: 08/02/1940, 81 y.o.   MRN: [id number]  Chief Complaint  Patient presents with   Medical Management of Chronic Issues    Tramadol she is not getting enough in quantity    Gastroesophageal Reflux    Med she is on not helping wants to change    Pain Management    Hurts all over, shoulder pain, back.   Headache    =   Pt presents to the office today for chronic follow up. She is followed by Cardiologists every 3 months for CAD and CHF. She is currently  has an aid and family that stays with her 24/7.   She reports she is having a hard time walking long distances. She has to use a rolling walker at all times, however, she can not walk more than a few feet without having to relax.    She saw an Endocrinologists for hypercalcemia & hyperparathyroidism. She is followed by Dermatologists for skin lesion.    She has COPD and states her breathing is stable as long as she it sitting and does not have to wear a mask.   She has CKD and tries to limit NSAID's. Gastroesophageal Reflux She complains of belching, coughing, heartburn, a hoarse voice and nausea. This is a chronic problem. The current episode started more than 1 year ago. The problem occurs occasionally. The problem has been waxing and waning. Associated symptoms include fatigue. She has tried a PPI for the symptoms. The treatment provided mild relief.  Headache  Associated symptoms include back pain, coughing, insomnia and nausea. Her past medical history is significant for hypertension and obesity.  Hypertension This is a chronic problem. The current episode started more than 1 year ago. The problem has been resolved since onset. The problem is controlled. Associated symptoms include anxiety, headaches, malaise/fatigue, peripheral edema and shortness of breath. Pertinent negatives include no PND. Risk factors for coronary artery disease include dyslipidemia, diabetes mellitus,  obesity and sedentary lifestyle. The current treatment provides moderate improvement. Identifiable causes of hypertension include a thyroid problem.  Congestive Heart Failure Presents for follow-up visit. Associated symptoms include edema, fatigue and shortness of breath. The symptoms have been stable.  Thyroid Problem Presents for follow-up visit. Symptoms include anxiety, fatigue and hoarse voice. Patient reports no depressed mood or dry skin. The symptoms have been stable. Her past medical history is significant for hyperlipidemia.  Arthritis Presents for follow-up visit. She complains of pain and stiffness. The symptoms have been worsening. Affected locations include the left knee, right knee, right shoulder, left shoulder, left hip and right hip (back). Her pain is at a severity of 7/10. Associated symptoms include fatigue.  Hyperlipidemia This is a chronic problem. The current episode started more than 1 year ago. Exacerbating diseases include obesity. Associated symptoms include shortness of breath. Current antihyperlipidemic treatment includes diet change. Risk factors for coronary artery disease include dyslipidemia, diabetes mellitus, hypertension, a sedentary lifestyle and post-menopausal.  Back Pain This is a chronic problem. The current episode started more than 1 year ago. The problem occurs intermittently. The problem has been waxing and waning since onset. The pain is present in the lumbar spine. The pain is at a severity of 7/10. The pain is moderate. Associated symptoms include headaches. She has tried bed rest for the symptoms. The treatment provided mild relief.  Anxiety Presents for follow-up visit. Symptoms include excessive worry, insomnia, irritability, nausea, nervous/anxious behavior, restlessness  and shortness of breath. Patient reports no depressed mood. Symptoms occur most days. The severity of symptoms is moderate.   Pain assessment: Pain location- Shoulders and  back Pain on scale of 1-10- 7-8 Frequency- intermittently  What increases pain-Using What makes pain Better- resting and Ultram  Effects on ADL - Can not stand  Current opioids rx- Ultram 50 mg # meds rx- 30 Effectiveness of current meds-States she needs more a month. Does not take daily, but when her pain is flared she will take 2 BID and runs out before 30 days Adverse reactions from pain meds-none Morphine equivalent- 6.25  Pill count performed-No Last drug screen - 04/24/21 ( high risk q36m, moderate risk q22m, low risk yearly ) Urine drug screen today- Yes Was the [place] reviewed- Yes  If yes were their any concerning findings? - none  Pain contract signed on: 04/24/21     Review of Systems  Constitutional:  Positive for fatigue, irritability and malaise/fatigue.  HENT:  Positive for hoarse voice.   Respiratory:  Positive for cough and shortness of breath.   Cardiovascular:  Negative for PND.  Gastrointestinal:  Positive for heartburn and nausea.  Musculoskeletal:  Positive for arthritis, back pain and stiffness.  Neurological:  Positive for headaches.  Psychiatric/Behavioral:  The patient is nervous/anxious and has insomnia.   All other systems reviewed and are negative.     Objective:   Physical Exam Vitals reviewed.  Constitutional:      General: She is not in acute distress.    Appearance: She is well-developed. She is obese.  HENT:     Head: Normocephalic and atraumatic.     Right Ear: External ear normal.  Eyes:     Pupils: Pupils are equal, round, and reactive to light.  Neck:     Thyroid: No thyromegaly.  Cardiovascular:     Rate and Rhythm: Normal rate and regular rhythm.     Heart sounds: Normal heart sounds. No murmur heard. Pulmonary:     Effort: Pulmonary effort is normal. No respiratory distress.     Breath sounds: Normal breath sounds. No wheezing.  Abdominal:     General: Bowel sounds are normal. There is no distension.     Palpations:  Abdomen is soft.     Tenderness: There is no abdominal tenderness.  Musculoskeletal:        General: No tenderness. Normal range of motion.     Cervical back: Normal range of motion and neck supple.  Skin:    General: Skin is warm and dry.  Neurological:     Mental Status: She is alert and oriented to person, place, and time.     Cranial Nerves: No cranial nerve deficit.     Deep Tendon Reflexes: Reflexes are normal and symmetric.  Psychiatric:        Mood and Affect: Mood is anxious.        Behavior: Behavior normal.        Thought Content: Thought content normal.        Judgment: Judgment normal.     BP 109/66    Pulse 86    Temp (!) 97.5 F (36.4 C) (Temporal)    Ht $R'4\' 11"'Uh$  (1.499 m)    Wt 236 lb 6.4 oz (107.2 kg)    BMI 47.75 kg/m       Assessment & Plan:  JACOBI RYANT comes in today with chief complaint of Medical Management of Chronic Issues (Tramadol she is not getting enough  in quantity ), Gastroesophageal Reflux (Med she is on not helping wants to change ), Pain Management (Hurts all over, shoulder pain, back.), and Headache (=)   Diagnosis and orders addressed:  1. Essential hypertension, benign - CMP14+EGFR - CBC with Differential/Platelet  2. Chronic systolic CHF (congestive heart failure) (HCC) - CMP14+EGFR - CBC with Differential/Platelet  3. Chronic obstructive pulmonary disease, unspecified COPD type ([place]) - CMP14+EGFR - CBC with Differential/Platelet  4. Hypothyroidism, unspecified type - CMP14+EGFR - CBC with Differential/Platelet  5. Stage 3b chronic kidney disease ([place]) - CMP14+EGFR - CBC with Differential/Platelet  6. Primary osteoarthritis involving multiple joints - CMP14+EGFR - CBC with Differential/Platelet  7. Moderate episode of recurrent major depressive disorder (HCC) - CMP14+EGFR - CBC with Differential/Platelet  8. GAD (generalized anxiety disorder) Xanax .refilled  States she does not take daily and educated not to take with  Ultram - CMP14+EGFR - CBC with Differential/Platelet - ALPRAZolam (XANAX) 0.5 MG tablet; Take 1 tablet (0.5 mg total) by mouth 2 (two) times daily as needed for anxiety.  Dispense: 60 tablet; Refill: 2  9. Benzodiazepine dependence (HCC) - CMP14+EGFR - CBC with Differential/Platelet - ALPRAZolam (XANAX) 0.5 MG tablet; Take 1 tablet (0.5 mg total) by mouth 2 (two) times daily as needed for anxiety.  Dispense: 60 tablet; Refill: 2  10. Chronic bilateral low back pain, unspecified whether sciatica present - CMP14+EGFR - CBC with Differential/Platelet  11. Controlled substance agreement signed - CMP14+EGFR - CBC with Differential/Platelet - ALPRAZolam (XANAX) 0.5 MG tablet; Take 1 tablet (0.5 mg total) by mouth 2 (two) times daily as needed for anxiety.  Dispense: 60 tablet; Refill: 2 - traMADol (ULTRAM) 50 MG tablet; Take 2 tablets (100 mg total) by mouth every 12 (twelve) hours as needed. TAKE TWO TABLETS EVERY TWELVE HOURS AS NEEDED FOR PAIN  Dispense: 45 tablet; Refill: 2  12. Mixed hyperlipidemia - CMP14+EGFR - CBC with Differential/Platelet  13. Morbid obesity ([place]) - CMP14+EGFR - CBC with Differential/Platelet  14. Pain medication agreement signed - CMP14+EGFR - CBC with Differential/Platelet - ToxASSURE Select 13 (MW), Urine  15. Primary hyperparathyroidism (HCC) - CMP14+EGFR - CBC with Differential/Platelet  16. Chronic bilateral back pain, unspecified back location - traMADol (ULTRAM) 50 MG tablet; Take 2 tablets (100 mg total) by mouth every 12 (twelve) hours as needed. TAKE TWO TABLETS EVERY TWELVE HOURS AS NEEDED FOR PAIN  Dispense: 45 tablet; Refill: 2   Labs pending Patient reviewed in [place] controlled database, no flags noted. Contract and drug screen are up to date.  Health Maintenance reviewed Diet and exercise encouraged  Follow up plan: 3 months    [name], FNP

## 2021-04-24 NOTE — Progress Notes (Incomplete)
? ? ?Manufacturing engineer ?Community Palliative Care Consult Note ?Telephone: [phone]  ?Fax: [phone]  ? ? ?Date of encounter: 04/24/21 ?10:14 PM ?PATIENT NAME: [name] [address]   ?[phone] (home)  ?DOB: [birth year] ?MRN: [id number] ?PRIMARY CARE PROVIDER:    ?Sheila Balloon, FNP,  ?[address] ?[place] [address] ?[phone] ? ?REFERRING PROVIDER:   ?Sheila Balloon, FNP ?[address] ?[place],  [place] [address] ?[phone] ? ?RESPONSIBLE PARTY:    ?Contact Information   ? ? Name Relation Home Work Mobile  ? [name],Sheila Daughter   [phone]  ? [name],Sheila Daughter [phone]  [phone]  ? Sheila [name] Granddaughter   [phone]  ? Sheila [name] Other   [phone]  ? ?  ? ? ? ?I met face to face with patient and family in *** home/facility. Palliative Care was asked to follow this patient by consultation request of  Sheila Balloon, FNP to address advance care planning and complex medical decision making. This is a follow up visit. ? ?                                 ASSESSMENT AND PLAN / RECOMMENDATIONS:  ? ?Advance Care Planning/Goals of Care: Goals include to maximize quality of life and symptom management. Patient/health care surrogate gave his/her permission to discuss. ?Our advance care planning conversation included a discussion about:    ?The value and importance of advance care planning  ?Experiences with loved ones who have been seriously ill or have died  ?Exploration of personal, cultural or spiritual beliefs that might influence medical decisions  ?Exploration of goals of care in the event of a sudden injury or illness  ?Identification of a healthcare agent  ?Review and updating or creation of an  advance directive document . ?Decision not to resuscitate or to de-escalate disease focused treatments due to poor prognosis. ?CODE STATUS: ? ?Symptom Management/Plan: ? ? ? ?Follow up Palliative Care Visit: Palliative care  will continue to follow for complex medical decision making, advance care planning, and clarification of goals. Return *** weeks or prn. ? ?I spent *** minutes providing this consultation. More than 50% of the time in this consultation was spent in counseling and care coordination. ? ?This visit was coded based on medical decision making (MDM).*** ? ?PPS: ***0% ? ?HOSPICE ELIGIBILITY/DIAGNOSIS: TBD ? ?Chief Complaint: *** ? ?HISTORY OF PRESENT ILLNESS:  Sheila [name] is a 81 y.o. year old female  with *** .  ? ?History obtained from review of EMR, discussion with primary team, and interview with family, facility staff/caregiver and/or Sheila [name].  ?I reviewed available labs, medications, imaging, studies and related documents from the EMR.  Records reviewed and summarized above.  ? ?ROS ? ?*** ?General: NAD ?EYES: denies vision changes ?ENMT: denies dysphagia ?Cardiovascular: denies chest pain, denies DOE ?Pulmonary: denies cough, denies increased SOB ?Abdomen: endorses good appetite, denies constipation, endorses continence of bowel ?GU: denies dysuria, endorses continence of urine ?MSK:  denies increased weakness,  no falls reported ?Skin: denies rashes or wounds ?Neurological: denies pain, denies insomnia ?Psych: Endorses positive mood ?Heme/lymph/immuno: denies bruises, abnormal bleeding ? ?Physical Exam: ?Current and past weights: ?Constitutional: NAD ?General: frail appearing, thin/WNWD/obese  ?EYES: anicteric sclera, lids intact, no discharge  ?ENMT: intact hearing, oral mucous membranes moist, dentition intact ?CV: S1S2, RRR, no LE edema ?Pulmonary: LCTA, no increased work of breathing, no cough,  room air ?Abdomen: intake 100%, normo-active BS + 4 quadrants, soft and non tender, no ascites ?GU: deferred ?MSK: no sarcopenia, moves all extremities, ambulatory ?Skin: warm and dry, no rashes or wounds on visible skin ?Neuro:  no generalized weakness,  no cognitive impairment ?Psych: non-anxious affect, A and O x  3 ?Hem/lymph/immuno: no widespread bruising ? ? ?Thank you for the opportunity to participate in the care of Sheila [name].  The palliative care team will continue to follow. Please call our office at [phone] if we can be of additional assistance.  ? ?Sheila Conception, FNP  ? ?COVID-19 PATIENT SCREENING TOOL ?Asked and negative response unless otherwise noted:  ? ?Have you had symptoms of covid, tested positive or been in contact with someone with symptoms/positive test in the past 5-10 days?   ?

## 2021-04-24 NOTE — Addendum Note (Signed)
Addended by: Brynda Peon F on: 04/24/2021 11:48 AM ? ? Modules accepted: Orders ? ?

## 2021-04-25 ENCOUNTER — Telehealth: Payer: Self-pay | Admitting: Family

## 2021-04-25 ENCOUNTER — Other Ambulatory Visit: Payer: Self-pay

## 2021-04-25 LAB — CBC WITH DIFFERENTIAL/PLATELET
Basophils Absolute: 0.1 10*3/uL (ref 0.0–0.2)
Basos: 1 %
EOS (ABSOLUTE): 0.3 10*3/uL (ref 0.0–0.4)
Eos: 2 %
Hematocrit: 43 % (ref 34.0–46.6)
Hemoglobin: 14.4 g/dL (ref 11.1–15.9)
Immature Grans (Abs): 0 10*3/uL (ref 0.0–0.1)
Immature Granulocytes: 0 %
Lymphocytes Absolute: 3.9 10*3/uL — ABNORMAL HIGH (ref 0.7–3.1)
Lymphs: 27 %
MCH: 29.2 pg (ref 26.6–33.0)
MCHC: 33.5 g/dL (ref 31.5–35.7)
MCV: 87 fL (ref 79–97)
Monocytes Absolute: 0.8 10*3/uL (ref 0.1–0.9)
Monocytes: 5 %
Neutrophils Absolute: 9.3 10*3/uL — ABNORMAL HIGH (ref 1.4–7.0)
Neutrophils: 65 %
Platelets: 300 10*3/uL (ref 150–450)
RBC: 4.93 x10E6/uL (ref 3.77–5.28)
RDW: 13.3 % (ref 11.7–15.4)
WBC: 14.5 10*3/uL — ABNORMAL HIGH (ref 3.4–10.8)

## 2021-04-25 LAB — CMP14+EGFR
ALT: 21 IU/L (ref 0–32)
AST: 25 IU/L (ref 0–40)
Albumin/Globulin Ratio: 1.6 (ref 1.2–2.2)
Albumin: 4.5 g/dL (ref 3.7–4.7)
Alkaline Phosphatase: 57 IU/L (ref 44–121)
BUN/Creatinine Ratio: 10 — ABNORMAL LOW (ref 12–28)
BUN: 12 mg/dL (ref 8–27)
Bilirubin Total: 0.5 mg/dL (ref 0.0–1.2)
CO2: 19 mmol/L — ABNORMAL LOW (ref 20–29)
Calcium: 9.9 mg/dL (ref 8.7–10.3)
Chloride: 102 mmol/L (ref 96–106)
Creatinine, Ser: 1.17 mg/dL — ABNORMAL HIGH (ref 0.57–1.00)
Globulin, Total: 2.8 g/dL (ref 1.5–4.5)
Glucose: 132 mg/dL — ABNORMAL HIGH (ref 70–99)
Potassium: 4.9 mmol/L (ref 3.5–5.2)
Sodium: 142 mmol/L (ref 134–144)
Total Protein: 7.3 g/dL (ref 6.0–8.5)
eGFR: 47 mL/min/{1.73_m2} — ABNORMAL LOW (ref >59)

## 2021-04-25 MED ORDER — PANTOPRAZOLE SODIUM 40 MG PO TBEC: 40.0000 mg | DELAYED_RELEASE_TABLET | Freq: Every day | ORAL | 3 refills | Status: DC

## 2021-04-25 NOTE — Telephone Encounter (Signed)
Protonix 40 sent 20mg  changed per [facility]  ?

## 2021-04-25 NOTE — Telephone Encounter (Signed)
Calling because something was supposed to get called in for acid reflux and the pharmacy has not received anything. Please send and let patient know when it has been done.  ?

## 2021-04-25 NOTE — Telephone Encounter (Signed)
Patient aware and verbalized understanding. °

## 2021-04-27 LAB — TOXASSURE SELECT 13 (MW), URINE

## 2021-04-30 ENCOUNTER — Other Ambulatory Visit: Payer: Self-pay | Admitting: Family

## 2021-04-30 DIAGNOSIS — L282 Other prurigo: Secondary | ICD-10-CM

## 2021-05-05 ENCOUNTER — Other Ambulatory Visit: Payer: Self-pay | Admitting: Nurse Practitioner

## 2021-05-05 ENCOUNTER — Other Ambulatory Visit: Payer: Self-pay | Admitting: Cardiology

## 2021-05-05 ENCOUNTER — Other Ambulatory Visit: Payer: Self-pay | Admitting: Family

## 2021-05-05 DIAGNOSIS — E559 Vitamin D deficiency, unspecified: Secondary | ICD-10-CM

## 2021-05-05 MED ORDER — CINACALCET HCL 30 MG PO TABS: 30.0000 mg | ORAL_TABLET | ORAL | 2 refills | Status: DC

## 2021-05-13 ENCOUNTER — Other Ambulatory Visit: Payer: Self-pay | Admitting: Family

## 2021-05-13 DIAGNOSIS — F331 Major depressive disorder, recurrent, moderate: Secondary | ICD-10-CM

## 2021-05-13 DIAGNOSIS — F411 Generalized anxiety disorder: Secondary | ICD-10-CM

## 2021-05-15 ENCOUNTER — Ambulatory Visit (INDEPENDENT_AMBULATORY_CARE_PROVIDER_SITE_OTHER): Payer: Medicare Other | Admitting: Family Medicine

## 2021-05-15 ENCOUNTER — Encounter: Payer: Self-pay | Admitting: Family Medicine

## 2021-05-15 VITALS — BP 106/62 | HR 78 | Temp 97.0°F | Ht 59.0 in | Wt 233.2 lb

## 2021-05-15 DIAGNOSIS — J441 Chronic obstructive pulmonary disease with (acute) exacerbation: Secondary | ICD-10-CM | POA: Diagnosis not present

## 2021-05-15 DIAGNOSIS — E559 Vitamin D deficiency, unspecified: Secondary | ICD-10-CM | POA: Diagnosis not present

## 2021-05-15 MED ORDER — AZITHROMYCIN 250 MG PO TABS: ORAL_TABLET | ORAL | 0 refills | Status: DC

## 2021-05-15 MED ORDER — PREDNISONE 20 MG PO TABS: 40.0000 mg | ORAL_TABLET | Freq: Every day | ORAL | 0 refills | Status: AC

## 2021-05-15 NOTE — Progress Notes (Signed)
? ?Assessment & Plan:  ?1. COPD exacerbation ([place]) ?Advised to use her Albuterol nebulizer every 4 hours scheduled while she is sick. Continue Breztri twice daily. Start azithromycin and prednisone.  ?- azithromycin (ZITHROMAX Z-PAK) 250 MG tablet; Take 2 tablets (500 mg) PO today, then 1 tablet (250 mg) PO daily x4 days.  Dispense: 6 tablet; Refill: 0 ?- predniSONE (DELTASONE) 20 MG tablet; Take 2 tablets (40 mg total) by mouth daily with breakfast for 5 days.  Dispense: 10 tablet; Refill: 0 ? ? ?Follow up plan: Return if symptoms worsen or fail to improve. ? ?Hendricks Limes, MSN, APRN, FNP-C ?[facility] ? ?Subjective:  ? ?Patient ID: Sheila [name], female    DOB: 09-Dec-1940, 81 y.o.   MRN: [id number] ? ?HPI: ?Sheila [name] is a 81 y.o. female presenting on 05/15/2021 for Cough, Nasal Congestion, Abdominal Pain, and Wheezing (X 1 week - otc not helping ) ? ?Patient complains of cough, chest congestion, headache, runny nose, ear pain/pressure, shortness of breath, wheezing, and abdominal pain. Cough is sometimes productive  of thicker than normal sputum. Worsening of wheezing and shortness of breath. Onset of symptoms was 1 week ago, gradually worsening since that time. She is drinking plenty of fluids. Evaluation to date: none. Treatment to date:  Mucinex and Albuterol . She has a history of COPD. She does not smoke, but does dip snuff. ? ? ?ROS: Negative unless specifically indicated above in HPI.  ? ?Relevant past medical history reviewed and updated as indicated.  ? ?Allergies and medications reviewed and updated. ? ? ?Current Outpatient Medications:  ?  albuterol (PROVENTIL) (2.5 MG/3ML) 0.083% nebulizer solution, NEBULIZE 1 VIAL EVERY 6-8 HOURS AS NEEDED FOR WHEEZING OR SHORTNESS OF BREATH, Disp: 180 mL, Rfl: 1 ?  ALPRAZolam (XANAX) 0.5 MG tablet, Take 1 tablet (0.5 mg total) by mouth 2 (two) times daily as needed for anxiety., Disp: 60 tablet, Rfl: 2 ?  Budeson-Glycopyrrol-Formoterol  (BREZTRI AEROSPHERE) 160-9-4.8 MCG/ACT AERO, Inhale 2 puffs into the lungs 2 (two) times daily., Disp: 10.7 g, Rfl: 11 ?  Cholecalciferol (VITAMIN D3) 25 MCG (1000 UT) CAPS, Take 1,000 Units by mouth daily. , Disp: , Rfl:  ?  cinacalcet (SENSIPAR) 30 MG tablet, Take 1 tablet (30 mg total) by mouth every other day., Disp: 60 tablet, Rfl: 2 ?  ENTRESTO 49-51 MG, TAKE 1 TABLET 2 TIMES A DAY, Disp: 60 tablet, Rfl: 11 ?  gabapentin (NEURONTIN) 300 MG capsule, TAKE 1 CAPSULE 3 TIMES A DAY, Disp: 270 capsule, Rfl: 0 ?  hydrOXYzine (VISTARIL) 25 MG capsule, TAKE ONE CAPSULE THREE TIMES DAILY AS NEEDED, Disp: 90 capsule, Rfl: 0 ?  levocetirizine (XYZAL) 5 MG tablet, TAKE 1 TABLET ONCE DAILY IN THE EVENING (Patient taking differently: Take 5 mg by mouth every evening.), Disp: 30 tablet, Rfl: 5 ?  levothyroxine (SYNTHROID) 88 MCG tablet, TAKE 1 TABLET DAILY, Disp: 90 tablet, Rfl: 3 ?  LIVALO 4 MG TABS, TAKE 1 TABLET DAILY, Disp: 90 tablet, Rfl: 0 ?  metoprolol tartrate (LOPRESSOR) 25 MG tablet, Take 0.5 tablets (12.5 mg total) by mouth 2 (two) times daily., Disp: 60 tablet, Rfl: 3 ?  pantoprazole (PROTONIX) 40 MG tablet, Take 1 tablet (40 mg total) by mouth daily., Disp: 90 tablet, Rfl: 3 ?  PROLIA 60 MG/ML SOSY injection, Inject into the skin once., Disp: , Rfl:  ?  sertraline (ZOLOFT) 100 MG tablet, Take 1 tablet (100 mg total) by mouth daily., Disp: 90 tablet, Rfl: 1 ?  spironolactone (ALDACTONE) 25 MG tablet, TAKE 1 TABLET ONCE DAILY, Disp: 30 tablet, Rfl: 11 ?  Tetrahydrozoline HCl (VISINE OP), Place 1 drop into both eyes daily as needed (dry eyes/itching)., Disp: , Rfl:  ?  torsemide (DEMADEX) 20 MG tablet, TAKE 2 TABLETS DAILY AS DIRECTED (Patient taking differently: Take 20 mg by mouth 2 (two) times daily.), Disp: 60 tablet, Rfl: 5 ?  traMADol (ULTRAM) 50 MG tablet, Take 2 tablets (100 mg total) by mouth every 12 (twelve) hours as needed. TAKE TWO TABLETS EVERY TWELVE HOURS AS NEEDED FOR PAIN, Disp: 45 tablet, Rfl:  2 ? ?Allergies  ?Allergen Reactions  ? Ibuprofen Other (See Comments)  ?  History of bleeding ulcers - contra-indicated ?  ? Benadryl [Diphenhydramine Hcl] Other (See Comments)  ?  Worsening depression ?  ? Lipitor [Atorvastatin] Other (See Comments)  ?  Body aches.  ? Baclofen Other (See Comments)  ?  Loopy   ? Hydrocodone Nausea And Vomiting  ? Codeine Nausea And Vomiting  ? Morphine And Related Nausea And Vomiting  ? Tdap [Tetanus-Diphth-Acell Pertussis] Swelling  ?  Redness and localized swelling at injection site ?  ? ? ?Objective:  ? ?BP 106/62   Pulse 78   Temp (!) 97 ?F (36.1 ?C) (Temporal)   Ht '4\' 11"'$  (1.499 m)   Wt 233 lb 3.2 oz (105.8 kg)   SpO2 93%   BMI 47.10 kg/m?   ? ?Physical Exam ?Vitals reviewed.  ?Constitutional:   ?   General: She is not in acute distress. ?   Appearance: Normal appearance. She is not ill-appearing, toxic-appearing or diaphoretic.  ?HENT:  ?   Head: Normocephalic and atraumatic.  ?   Right Ear: Tympanic membrane, ear canal and external ear normal. There is no impacted cerumen.  ?   Left Ear: Tympanic membrane, ear canal and external ear normal. There is no impacted cerumen.  ?   Nose: Nose normal. No congestion or rhinorrhea.  ?   Mouth/Throat:  ?   Mouth: Mucous membranes are moist.  ?   Pharynx: Oropharynx is clear. No oropharyngeal exudate or posterior oropharyngeal erythema.  ?Eyes:  ?   General: No scleral icterus.    ?   Right eye: No discharge.     ?   Left eye: No discharge.  ?   Conjunctiva/sclera: Conjunctivae normal.  ?Cardiovascular:  ?   Rate and Rhythm: Normal rate and regular rhythm.  ?   Heart sounds: Normal heart sounds. No murmur heard. ?  No friction rub. No gallop.  ?Pulmonary:  ?   Effort: Pulmonary effort is normal. No respiratory distress.  ?   Breath sounds: No stridor. Wheezing and rhonchi present. No rales.  ?Musculoskeletal:     ?   General: Normal range of motion.  ?   Cervical back: Normal range of motion.  ?Lymphadenopathy:  ?   Cervical: No  cervical adenopathy.  ?Skin: ?   General: Skin is warm and dry.  ?   Capillary Refill: Capillary refill takes less than 2 seconds.  ?Neurological:  ?   General: No focal deficit present.  ?   Mental Status: She is alert and oriented to person, place, and time. Mental status is at baseline.  ?Psychiatric:     ?   Mood and Affect: Mood normal.     ?   Behavior: Behavior normal.     ?   Thought Content: Thought content normal.     ?   Judgment:  Judgment normal.  ? ? ? ? ? ? ?

## 2021-05-16 ENCOUNTER — Other Ambulatory Visit: Payer: Self-pay | Admitting: Family

## 2021-05-16 LAB — PTH, INTACT AND CALCIUM
Calcium: 10.2 mg/dL (ref 8.7–10.3)
PTH: 157 pg/mL — ABNORMAL HIGH (ref 15–65)

## 2021-05-16 LAB — VITAMIN D 25 HYDROXY (VIT D DEFICIENCY, FRACTURES): Vit D, 25-Hydroxy: 23.7 ng/mL — ABNORMAL LOW (ref 30.0–100.0)

## 2021-05-19 ENCOUNTER — Telehealth: Payer: Self-pay | Admitting: Family

## 2021-05-19 ENCOUNTER — Encounter: Payer: Self-pay | Admitting: Nurse Practitioner

## 2021-05-19 ENCOUNTER — Other Ambulatory Visit: Payer: Self-pay

## 2021-05-19 ENCOUNTER — Ambulatory Visit (INDEPENDENT_AMBULATORY_CARE_PROVIDER_SITE_OTHER): Payer: Medicare Other | Admitting: Nurse Practitioner

## 2021-05-19 DIAGNOSIS — E559 Vitamin D deficiency, unspecified: Secondary | ICD-10-CM

## 2021-05-19 MED ORDER — CINACALCET HCL 30 MG PO TABS: 30.0000 mg | ORAL_TABLET | ORAL | 2 refills | Status: DC

## 2021-05-19 MED ORDER — BENZONATATE 200 MG PO CAPS: 200.0000 mg | ORAL_CAPSULE | Freq: Three times a day (TID) | ORAL | 1 refills | Status: DC | PRN

## 2021-05-19 NOTE — Progress Notes (Signed)
?                                                          Endocrinology Follow Up Note  ?     05/19/2021, 10:41 AM ? ? ? ?SUBJECTIVE: ? ?Sheila [name] is a 81 y.o.-year-old female, referred by her  Sheila Balloon, FNP  , for evaluation for hypercalcemia/hyperparathyroidism. ? ? ?Past Medical History:  ?Diagnosis Date  ? Allergy   ? Bell's palsy   ? Cancer [facility])   ? skin CA on face  ? Cataract   ? Chest pain 07/2009  ? Myoveiw stress test negative.  ? Depression   ? Diverticulosis of colon   ? DJD (degenerative joint disease) of knee   ? Dyspnea   ? on exertion  ? Family history of adverse reaction to anesthesia   ? sister ha nausea/vomiting  ? Gastric ulcer with hemorrhage 01/14/2011  ? GERD (gastroesophageal reflux disease)   ? Headache   ? History of fractured pelvis   ? Hypercholesteremia   ? Hypertension   ? Hypothyroidism   ? LV dysfunction   ? UTI (lower urinary tract infection)   ? ? ?Past Surgical History:  ?Procedure Laterality Date  ? APPENDECTOMY    ? BREAST BIOPSY Left   ? ESOPHAGOGASTRODUODENOSCOPY  01/14/2011  ? Procedure: ESOPHAGOGASTRODUODENOSCOPY (EGD);  Surgeon: Rogene Houston, MD;  Location: [facility];  Service: Endoscopy;  Laterality: N/A;  ? HIP SURGERY    ? pelvis  ? JOINT REPLACEMENT    ? KNEE SURGERY  04/2010.  ? Total left knee replacement  ? LAPAROSCOPIC APPENDECTOMY N/A 08/01/2013  ? Procedure: APPENDECTOMY LAPAROSCOPIC;  Surgeon: Jamesetta So, MD;  Location: [facility] ORS;  Service: General;  Laterality: N/A;  ? RIGHT/LEFT HEART CATH AND CORONARY ANGIOGRAPHY N/A 10/02/2019  ? Procedure: RIGHT/LEFT HEART CATH AND CORONARY ANGIOGRAPHY;  Surgeon: Lorretta Harp, MD;  Location: [facility];  Service: Cardiovascular;  Laterality: N/A;  ? ? ?Social History  ? ?Tobacco Use  ? Smoking status: Former  ?  Types: Cigarettes  ?  Quit date: 02/24/1968  ?  Years since quitting: 53.2  ? Smokeless tobacco: Current  ?  Types: Snuff  ? Tobacco comments:  ?  quit yrs and yrs ago when she was very young   ?Vaping Use  ? Vaping Use: Never used  ?Substance Use Topics  ? Alcohol use: No  ? Drug use: No  ? ? ?Family History  ?Problem Relation Age of Onset  ? Aneurysm Mother   ? Stroke Mother   ? Hypertension Mother   ? Cancer Father   ?     lung  ? Heart disease Sister   ? Hip fracture Sister   ? Cancer Brother   ? Alzheimer's disease Sister   ? Cancer Sister   ?     skin, uterine  ? Cancer Brother   ? ? ?Outpatient Encounter Medications as of 05/19/2021  ?Medication Sig  ? albuterol (PROVENTIL) (2.5 MG/3ML) 0.083% nebulizer solution NEBULIZE 1 VIAL EVERY 6 TO 8 HOURS AS NEEDED FOR WHEEZING OR SHORTNESS OF BREATH  ? ALPRAZolam (XANAX) 0.5 MG tablet Take 1 tablet (0.5 mg total) by mouth 2 (two) times daily as needed for anxiety.  ? azithromycin (ZITHROMAX Z-PAK) 250  MG tablet Take 2 tablets (500 mg) PO today, then 1 tablet (250 mg) PO daily x4 days.  ? Budeson-Glycopyrrol-Formoterol (BREZTRI AEROSPHERE) 160-9-4.8 MCG/ACT AERO Inhale 2 puffs into the lungs 2 (two) times daily.  ? Cholecalciferol (VITAMIN D3) 25 MCG (1000 UT) CAPS Take 1,000 Units by mouth daily.   ? cinacalcet (SENSIPAR) 30 MG tablet Take 1 tablet (30 mg total) by mouth every other day.  ? ENTRESTO 49-51 MG TAKE 1 TABLET 2 TIMES A DAY  ? gabapentin (NEURONTIN) 300 MG capsule TAKE 1 CAPSULE 3 TIMES A DAY  ? hydrOXYzine (VISTARIL) 25 MG capsule TAKE ONE CAPSULE THREE TIMES DAILY AS NEEDED  ? levocetirizine (XYZAL) 5 MG tablet TAKE 1 TABLET ONCE DAILY IN THE EVENING (Patient taking differently: Take 5 mg by mouth every evening.)  ? levothyroxine (SYNTHROID) 88 MCG tablet TAKE 1 TABLET DAILY  ? LIVALO 4 MG TABS TAKE 1 TABLET DAILY  ? metoprolol tartrate (LOPRESSOR) 25 MG tablet Take 0.5 tablets (12.5 mg total) by mouth 2 (two) times daily.  ? pantoprazole (PROTONIX) 40 MG tablet Take 1 tablet (40 mg total) by mouth daily.  ? predniSONE (DELTASONE) 20 MG tablet Take 2 tablets (40 mg total) by mouth daily with breakfast for 5 days.  ? PROLIA 60 MG/ML SOSY  injection Inject into the skin once.  ? sertraline (ZOLOFT) 100 MG tablet Take 1 tablet (100 mg total) by mouth daily.  ? spironolactone (ALDACTONE) 25 MG tablet TAKE 1 TABLET ONCE DAILY  ? Tetrahydrozoline HCl (VISINE OP) Place 1 drop into both eyes daily as needed (dry eyes/itching).  ? torsemide (DEMADEX) 20 MG tablet TAKE 2 TABLETS DAILY AS DIRECTED (Patient taking differently: Take 20 mg by mouth 2 (two) times daily.)  ? traMADol (ULTRAM) 50 MG tablet Take 2 tablets (100 mg total) by mouth every 12 (twelve) hours as needed. TAKE TWO TABLETS EVERY TWELVE HOURS AS NEEDED FOR PAIN  ? [DISCONTINUED] cinacalcet (SENSIPAR) 30 MG tablet Take 1 tablet (30 mg total) by mouth every other day.  ? ?No facility-administered encounter medications on file as of 05/19/2021.  ? ? ?Allergies  ?Allergen Reactions  ? Ibuprofen Other (See Comments)  ?  History of bleeding ulcers - contra-indicated ?  ? Benadryl [Diphenhydramine Hcl] Other (See Comments)  ?  Worsening depression ?  ? Lipitor [Atorvastatin] Other (See Comments)  ?  Body aches.  ? Baclofen Other (See Comments)  ?  Loopy   ? Hydrocodone Nausea And Vomiting  ? Codeine Nausea And Vomiting  ? Morphine And Related Nausea And Vomiting  ? Tdap [Tetanus-Diphth-Acell Pertussis] Swelling  ?  Redness and localized swelling at injection site ?  ? ? ? 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  Patient has no previously known history of parathyroid, pituitary, adrenal dysfunctions; no family history of such dysfunctions. ?-Review of her referral package of most recent labs reveals calcium of 10.8 the corresponding PTH of 116 on 04/01/20. ? ?I reviewed pt's DEXA scans: 2019 Her T score was -2.2 (considered osteopenic) ? ?No prior history of fragility fractures or falls. No history of kidney stones. ? ?Has History of CKD (stage 3a). Last BUN/Cr: 26/1.01 ? ?she is not on HCTZ or other thiazide therapy.   She does have a history of vitamin D deficiency. Last vitamin D level was 20.9 in 2017. ? ?she is not on calcium supplements (is on a MVI for women over  50),  she eats dairy and green, leafy, vegetables on average amounts.  She does drink milk in excess according to her niece who is with her today (drinks 3 gallons of milk per week). ? ?she does not have a family history of hypercalcemia, pituitary tumors, thyroid cancer, or osteoporosis.  ? ?I reviewed her chart and she also has a history of CHF, COPD, PUD, HLD, HTN, and chronic pain.  Recently, palliative care has gotten involved with her care.  ? ? ?Review of systems ? ?Constitutional: + Minimally fluctuating body weight,  current Body mass index is 46.66 kg/m?. , no fatigue, no subjective hyperthermia, no subjective hypothermia ?Eyes: no blurry vision, no xerophthalmia ?ENT: no sore throat, no nodules palpated in throat, no dysphagia/odynophagia, no hoarseness ?Cardiovascular: no chest pain, + shortness of breath with exertion (history of CHF), no palpitations, no leg swelling ?Respiratory: no cough, + shortness of breath with exertion ?Gastrointestinal: no nausea/vomiting/diarrhea ?Musculoskeletal: no muscle/joint aches, walks with walker due to deconditioning ?Skin: no rashes, no hyperemia ?Neurological: + mild resting tremors, no numbness, no tingling, no dizziness ?Psychiatric: no depression, no anxiety ? ?-------------------------------------------------------------------------------------------------------------------------------- ?OBJECTIVE: ? ?BP 103/67   Pulse 82   Ht '4\' 11"'$  (1.499 m)   Wt 231 lb (104.8 kg)   BMI 46.66 kg/m? , Body mass index is 46.66 kg/m?. ?Wt Readings from Last 3 Encounters:  ?05/19/21 231 lb (104.8 kg)  ?05/15/21 233 lb 3.2 oz (105.8 kg)  ?04/24/21 236 lb 6.4 oz (107.2 kg)  ?  ?BP Readings from Last 3 Encounters:  ?05/19/21 103/67  ?05/15/21 106/62  ?04/24/21 109/66  ? ? ? ?Physical Exam- Limited ? ?Constitutional:  Body mass  index is 46.66 kg/m?. , not in acute distress, normal state of mind ?Eyes:  EOMI, no exophthalmos ?Neck: Supple ?Cardiovascular: RRR, no murmurs, rubs, or gallops, no edema ?Respiratory: Adequate breathing efforts

## 2021-05-19 NOTE — Telephone Encounter (Signed)
The Zpak should say in your system up to 10 days. Tessalon Prescription sent to pharmacy.  ?

## 2021-05-19 NOTE — Telephone Encounter (Signed)
lmtcb

## 2021-05-19 NOTE — Telephone Encounter (Signed)
Pt seen last week. She is finished with azithromycin (ZITHROMAX Z-PAK) 250 MG tablet and predniSONE (DELTASONE) 20 MG tablet today. Daughter says that pt is no better. Pt still has a cough. Use [employer].  ?

## 2021-05-27 ENCOUNTER — Other Ambulatory Visit: Payer: Self-pay | Admitting: Family

## 2021-05-27 DIAGNOSIS — L282 Other prurigo: Secondary | ICD-10-CM

## 2021-06-10 ENCOUNTER — Other Ambulatory Visit: Payer: Self-pay | Admitting: Family

## 2021-06-12 ENCOUNTER — Ambulatory Visit: Payer: Commercial Managed Care - HMO | Admitting: Family

## 2021-06-17 ENCOUNTER — Telehealth: Payer: Self-pay | Admitting: Family

## 2021-06-17 NOTE — Telephone Encounter (Signed)
Appt made

## 2021-06-18 ENCOUNTER — Encounter: Payer: Self-pay | Admitting: Family

## 2021-06-19 ENCOUNTER — Ambulatory Visit (INDEPENDENT_AMBULATORY_CARE_PROVIDER_SITE_OTHER): Payer: Medicare Other | Admitting: Family

## 2021-06-19 ENCOUNTER — Encounter: Payer: Self-pay | Admitting: Family

## 2021-06-19 VITALS — BP 115/63 | HR 90 | Ht 59.0 in | Wt 237.0 lb

## 2021-06-19 DIAGNOSIS — N1832 Chronic kidney disease, stage 3b: Secondary | ICD-10-CM | POA: Diagnosis not present

## 2021-06-19 DIAGNOSIS — M545 Low back pain, unspecified: Secondary | ICD-10-CM | POA: Diagnosis not present

## 2021-06-19 DIAGNOSIS — R531 Weakness: Secondary | ICD-10-CM

## 2021-06-19 DIAGNOSIS — M159 Polyosteoarthritis, unspecified: Secondary | ICD-10-CM

## 2021-06-19 DIAGNOSIS — M549 Dorsalgia, unspecified: Secondary | ICD-10-CM

## 2021-06-19 DIAGNOSIS — I10 Essential (primary) hypertension: Secondary | ICD-10-CM | POA: Diagnosis not present

## 2021-06-19 DIAGNOSIS — E039 Hypothyroidism, unspecified: Secondary | ICD-10-CM

## 2021-06-19 DIAGNOSIS — E782 Mixed hyperlipidemia: Secondary | ICD-10-CM

## 2021-06-19 DIAGNOSIS — Z720 Tobacco use: Secondary | ICD-10-CM

## 2021-06-19 DIAGNOSIS — F411 Generalized anxiety disorder: Secondary | ICD-10-CM

## 2021-06-19 DIAGNOSIS — Z0289 Encounter for other administrative examinations: Secondary | ICD-10-CM

## 2021-06-19 DIAGNOSIS — I5022 Chronic systolic (congestive) heart failure: Secondary | ICD-10-CM

## 2021-06-19 DIAGNOSIS — J449 Chronic obstructive pulmonary disease, unspecified: Secondary | ICD-10-CM | POA: Diagnosis not present

## 2021-06-19 DIAGNOSIS — Z79899 Other long term (current) drug therapy: Secondary | ICD-10-CM

## 2021-06-19 DIAGNOSIS — F331 Major depressive disorder, recurrent, moderate: Secondary | ICD-10-CM

## 2021-06-19 DIAGNOSIS — F1320 Sedative, hypnotic or anxiolytic dependence, uncomplicated: Secondary | ICD-10-CM

## 2021-06-19 DIAGNOSIS — G8929 Other chronic pain: Secondary | ICD-10-CM

## 2021-06-19 MED ORDER — TRAMADOL HCL 50 MG PO TABS: 100.0000 mg | ORAL_TABLET | Freq: Two times a day (BID) | ORAL | 2 refills | Status: DC | PRN

## 2021-06-19 NOTE — Patient Instructions (Signed)
COPD and Physical Activity ?Chronic obstructive pulmonary disease (COPD) is a long-term, or chronic, condition that affects the lungs. COPD is a general term that can be used to describe many problems that cause inflammation of the lungs and limit airflow. These conditions include chronic bronchitis and emphysema. ?The main symptom of COPD is shortness of breath, which makes it harder to do even simple tasks. This can also make it harder to exercise and stay active. Talk with your health care provider about treatments to help you breathe better and actions you can take to prevent breathing problems during physical activity. ?What are the benefits of exercising when you have COPD? ?Exercising regularly is an important part of a healthy lifestyle. You can still exercise and do physical activities even though you have COPD. Exercise and physical activity improve your shortness of breath by increasing blood flow (circulation). This causes your heart to pump more oxygen through your body. Moderate exercise can: ?Improve oxygen use. ?Increase your energy level. ?Help with shortness of breath. ?Strengthen your breathing muscles. ?Improve heart health. ?Help with sleep. ?Improve your self-esteem and feelings of self-worth. ?Lower depression, stress, and anxiety. ?Exercise can benefit everyone with COPD. The severity of your disease may affect how hard you can exercise, especially at first, but everyone can benefit. Talk with your health care provider about how much exercise is safe for you, and which activities and exercises are safe for you. ?What actions can I take to prevent breathing problems during physical activity? ?Sign up for a pulmonary rehabilitation program. This type of program may include: ?Education about lung diseases. ?Exercise classes that teach you how to exercise and be more active while improving your breathing. This usually involves: ?Exercise using your lower extremities, such as a stationary  bicycle. ?About 30 minutes of exercise, 2 to 5 times per week, for 6 to 12 weeks. ?Strength training, such as push-ups or leg lifts. ?Nutrition education. ?Group classes in which you can talk with others who also have COPD and learn ways to manage stress. ?If you use an oxygen tank, you should use it while you exercise. Work with your health care provider to adjust your oxygen for your physical activity. Your resting flow rate is different from your flow rate during physical activity. ?How to manage your breathing while exercising ?While you are exercising: ?Take slow breaths. ?Pace yourself, and do nottry to go too fast. ?Purse your lips while breathing out. Pursing your lips is similar to a kissing or whistling position. ?If doing exercise that uses a quick burst of effort, such as weight lifting: ?Breathe in before starting the exercise. ?Breathe out during the hardest part of the exercise, such as raising the weights. ?Where to find support ?You can find support for exercising with COPD from: ?Your health care provider. ?A pulmonary rehabilitation program. ?Your local health department or community health programs. ?Support groups, either online or in-person. Your health care provider may be able to recommend support groups. ?Where to find more information ?You can find more information about exercising with COPD from: ?American Lung Association: lung.org ?COPD Foundation: copdfoundation.org ?Contact a health care provider if: ?Your symptoms get worse. ?You have nausea. ?You have a fever. ?You want to start a new exercise program or a new activity. ?Get help right away if: ?You have chest pain. ?You cannot breathe. ?These symptoms may represent a serious problem that is an emergency. Do not wait to see if the symptoms will go away. Get medical help right away. Call   your local emergency services (911 in the [place] Do not drive yourself to the hospital. ?Summary ?COPD is a general term that can be used to describe  many different lung problems that cause lung inflammation and limit airflow. This includes chronic bronchitis and emphysema. ?Exercise and physical activity improve your shortness of breath by increasing blood flow (circulation). This causes your heart to provide more oxygen to your body. ?Contact your health care provider before starting any exercise program or new activity. Ask your health care provider what exercises and activities are safe for you. ?This information is not intended to replace advice given to you by your health care provider. Make sure you discuss any questions you have with your health care provider. ?Document Revised: 12/19/2019 Document Reviewed: 12/19/2019 ?Elsevier Patient Education ? 2023 [employer] ? ?

## 2021-06-19 NOTE — Progress Notes (Signed)
? ?Subjective:  ? ? Patient ID: Sheila [name], female    DOB: Feb 12, 1941, 81 y.o.   MRN: [id number] ? ?Chief Complaint  ?Patient presents with  ? Medical Management of Chronic Issues  ?  Xanax refills ?  ? ?Pt presents to the office today for chronic follow up. She is followed by Cardiologists every 3 months for CAD and CHF. She is currently  has an aid and family that stays with her 24/7. ?  ?She reports she is having a hard time walking long distances. She has to use a rolling walker at all times, however, she can not walk more than a few feet without having to relax.  ?  ?She saw an Endocrinologists for  hypercalcemia & hyperparathyroidism. She is followed by Dermatologists for skin lesion.  ?  ?She has COPD and states her breathing is stable as long as she it sitting and does not have to wear a mask. States she gets SOB with exertion.  ?  ?She has CKD and tries to limit NSAID's. ?Hypertension ?This is a chronic problem. The current episode started more than 1 year ago. The problem has been resolved since onset. The problem is controlled. Associated symptoms include anxiety, malaise/fatigue, peripheral edema and shortness of breath. Risk factors for coronary artery disease include dyslipidemia, diabetes mellitus, obesity, sedentary lifestyle and smoking/tobacco exposure. The current treatment provides moderate improvement. Hypertensive end-organ damage includes kidney disease and heart failure. Identifiable causes of hypertension include a thyroid problem.  ?Congestive Heart Failure ?Presents for follow-up visit. Associated symptoms include edema, fatigue and shortness of breath.  ?Thyroid Problem ?Presents for follow-up visit. Symptoms include anxiety, depressed mood, dry skin, fatigue and hoarse voice. Patient reports no constipation or diarrhea. The symptoms have been stable. Her past medical history is significant for heart failure and hyperlipidemia.  ?Arthritis ?Presents for follow-up visit. She complains of  pain and stiffness. The symptoms have been stable. Affected locations include the left knee, right knee, left MCP and right MCP. Her pain is at a severity of 0/10. Associated symptoms include fatigue. Pertinent negatives include no diarrhea.  ?Hyperlipidemia ?This is a chronic problem. The current episode started more than 1 year ago. The problem is controlled. Recent lipid tests were reviewed and are normal. Exacerbating diseases include obesity. Associated symptoms include shortness of breath. Current antihyperlipidemic treatment includes statins. The current treatment provides moderate improvement of lipids. Risk factors for coronary artery disease include dyslipidemia, hypertension, a sedentary lifestyle and post-menopausal.  ?Anxiety ?Presents for follow-up visit. Symptoms include depressed mood, excessive worry, irritability, nervous/anxious behavior and shortness of breath. Symptoms occur most days. The severity of symptoms is moderate.  ? ? ?Depression ?       This is a chronic problem.  The current episode started more than 1 year ago.   Associated symptoms include fatigue.  Associated symptoms include no helplessness, no hopelessness and not sad.  Past treatments include SSRIs - Selective serotonin reuptake inhibitors.  Past medical history includes thyroid problem and anxiety.   ?Back Pain ?This is a chronic problem. The current episode started more than 1 year ago. The problem occurs intermittently. The pain is present in the lumbar spine. The pain is at a severity of 8/10 (when walking). The pain is moderate. She has tried analgesics and bed rest for the symptoms. The treatment provided mild relief.  ? ? ?Current opioids rx- ultram 5 mg  ?# meds rx- #45  ?Effectiveness of current meds-Stable  ?Adverse reactions from pain  meds-none ?Morphine equivalent- 20 ? ?Pill count performed-No ?Last drug screen - 04/24/21 ?( high risk q32m moderate risk q643mlow risk yearly ) ?Urine drug screen today- No ?Was the  NCSalliseviewed- Yes ? If yes were their any concerning findings? - none  ? ? ?Pain contract signed on: 04/25/21 ? ? ?Review of Systems  ?Constitutional:  Positive for fatigue, irritability and malaise/fatigue.  ?HENT:  Positive for hoarse voice.   ?Respiratory:  Positive for shortness of breath.   ?Gastrointestinal:  Negative for constipation and diarrhea.  ?Musculoskeletal:  Positive for arthritis, back pain and stiffness.  ?Psychiatric/Behavioral:  Positive for depression. The patient is nervous/anxious.   ?All other systems reviewed and are negative. ? ?   ?Objective:  ? Physical Exam ?Vitals reviewed.  ?Constitutional:   ?   General: She is not in acute distress. ?   Appearance: She is well-developed. She is obese.  ?HENT:  ?   Head: Normocephalic and atraumatic.  ?   Right Ear: Tympanic membrane normal.  ?   Left Ear: Tympanic membrane normal.  ?Eyes:  ?   Pupils: Pupils are equal, round, and reactive to light.  ?Neck:  ?   Thyroid: No thyromegaly.  ?Cardiovascular:  ?   Rate and Rhythm: Normal rate and regular rhythm.  ?   Heart sounds: Normal heart sounds. No murmur heard. ?Pulmonary:  ?   Effort: Pulmonary effort is normal. No respiratory distress.  ?   Breath sounds: Normal breath sounds. Decreased air movement present. No wheezing.  ?Abdominal:  ?   General: Bowel sounds are normal. There is no distension.  ?   Palpations: Abdomen is soft.  ?   Tenderness: There is no abdominal tenderness.  ?Musculoskeletal:     ?   General: No tenderness. Normal range of motion.  ?   Cervical back: Normal range of motion and neck supple.  ?Skin: ?   General: Skin is warm and dry.  ?Neurological:  ?   Mental Status: She is alert and oriented to person, place, and time.  ?   Cranial Nerves: No cranial nerve deficit.  ?   Motor: Weakness (using rolling walker) present.  ?   Gait: Gait abnormal.  ?   Deep Tendon Reflexes: Reflexes are normal and symmetric.  ?Psychiatric:     ?   Behavior: Behavior normal.     ?   Thought  Content: Thought content normal.     ?   Judgment: Judgment normal.  ? ? ?BP 115/63   Pulse 90   Ht '4\' 11"'$  (1.499 m)   Wt 237 lb (107.5 kg)   SpO2 91%   BMI 47.87 kg/m?  ? ? ?   ?Assessment & Plan:  ?ElBREEANNA GALGANOomes in today with chief complaint of Medical Management of Chronic Issues (Xanax refills/) ? ? ?Diagnosis and orders addressed: ? ?1. Essential hypertension, benign ? ? ?2. Chronic systolic CHF (congestive heart failure) ([facility] [place]? ?3. Chronic obstructive pulmonary disease, unspecified COPD type ([place]? ?4. Hypothyroidism, unspecified type ? ?5. Primary osteoarthritis involving multiple joints ?- For home use only DME 4 wheeled rolling walker with seat (D([id number]? ?6. Stage 3b chronic kidney disease ([place]? ?7. Benzodiazepine dependence ([facility] [place]? ?8. Chronic bilateral low back pain, unspecified whether sciatica present ?- For home use only DME 4 wheeled rolling walker with seat (D([id number]? ?9. Controlled substance agreement signed ?- traMADol (ULTRAM) 50 MG tablet; Take 2 tablets (100 mg total) by mouth every  12 (twelve) hours as needed. TAKE TWO TABLETS EVERY TWELVE HOURS AS NEEDED FOR PAIN  Dispense: 45 tablet; Refill: 2 ? ?10. Moderate episode of recurrent major depressive disorder (HCC) ? ?11. Morbid obesity ([place]) ? ?12. Mixed hyperlipidemia ? ?13. GAD (generalized anxiety disorder) ? ?14. Pain medication agreement signed ? ?15. Smokeless tobacco use ? ?16. Chronic bilateral back pain, unspecified back location ?- traMADol (ULTRAM) 50 MG tablet; Take 2 tablets (100 mg total) by mouth every 12 (twelve) hours as needed. TAKE TWO TABLETS EVERY TWELVE HOURS AS NEEDED FOR PAIN  Dispense: 45 tablet; Refill: 2 ? ?17. General weakness ?- For home use only DME 4 wheeled rolling walker with seat ([id number]) ? ? ?Labs pending ?Health Maintenance reviewed ?Diet and exercise encouraged ? ?Follow up plan: ?3 months  ? ?[name], FNP ? ? ? ?

## 2021-06-23 ENCOUNTER — Telehealth: Payer: Self-pay | Admitting: Family

## 2021-06-23 NOTE — Telephone Encounter (Signed)
Pt's last OV does not have documentation, appt made, needs O2 walk test ?

## 2021-06-24 ENCOUNTER — Other Ambulatory Visit: Payer: Self-pay

## 2021-06-24 ENCOUNTER — Other Ambulatory Visit: Payer: Self-pay | Admitting: Family

## 2021-06-24 DIAGNOSIS — G629 Polyneuropathy, unspecified: Secondary | ICD-10-CM

## 2021-06-24 MED ORDER — TORSEMIDE 20 MG PO TABS: 20.0000 mg | ORAL_TABLET | Freq: Two times a day (BID) | ORAL | 1 refills | Status: DC

## 2021-06-26 ENCOUNTER — Other Ambulatory Visit: Payer: Self-pay | Admitting: *Deleted

## 2021-06-26 ENCOUNTER — Other Ambulatory Visit: Payer: Self-pay

## 2021-06-26 ENCOUNTER — Encounter: Payer: Self-pay | Admitting: Family

## 2021-06-26 ENCOUNTER — Telehealth: Payer: Self-pay | Admitting: Cardiovascular Disease

## 2021-06-26 ENCOUNTER — Encounter (HOSPITAL_COMMUNITY): Payer: Self-pay

## 2021-06-26 ENCOUNTER — Inpatient Hospital Stay (HOSPITAL_COMMUNITY)
Admission: EM | Admit: 2021-06-26 | Discharge: 2021-06-30 | DRG: 871 | Disposition: A | Discharge status: Home Health | Payer: Medicare Other | Attending: Family Medicine | Admitting: Family Medicine

## 2021-06-26 ENCOUNTER — Other Ambulatory Visit (HOSPITAL_COMMUNITY): Payer: Self-pay | Admitting: *Deleted

## 2021-06-26 ENCOUNTER — Other Ambulatory Visit: Payer: Self-pay | Admitting: Family

## 2021-06-26 ENCOUNTER — Ambulatory Visit (INDEPENDENT_AMBULATORY_CARE_PROVIDER_SITE_OTHER): Payer: Medicare Other | Admitting: Family

## 2021-06-26 ENCOUNTER — Emergency Department (HOSPITAL_COMMUNITY): Payer: Medicare Other

## 2021-06-26 ENCOUNTER — Inpatient Hospital Stay (HOSPITAL_COMMUNITY): Payer: Medicare Other

## 2021-06-26 DIAGNOSIS — A4151 Sepsis due to Escherichia coli [E. coli]: Principal | ICD-10-CM | POA: Diagnosis present

## 2021-06-26 DIAGNOSIS — F419 Anxiety disorder, unspecified: Secondary | ICD-10-CM | POA: Diagnosis present

## 2021-06-26 DIAGNOSIS — I428 Other cardiomyopathies: Secondary | ICD-10-CM | POA: Diagnosis present

## 2021-06-26 DIAGNOSIS — I130 Hypertensive heart and chronic kidney disease with heart failure and stage 1 through stage 4 chronic kidney disease, or unspecified chronic kidney disease: Secondary | ICD-10-CM | POA: Diagnosis present

## 2021-06-26 DIAGNOSIS — Z20822 Contact with and (suspected) exposure to covid-19: Secondary | ICD-10-CM | POA: Diagnosis not present

## 2021-06-26 DIAGNOSIS — I10 Essential (primary) hypertension: Secondary | ICD-10-CM | POA: Diagnosis not present

## 2021-06-26 DIAGNOSIS — E785 Hyperlipidemia, unspecified: Secondary | ICD-10-CM | POA: Diagnosis not present

## 2021-06-26 DIAGNOSIS — N390 Urinary tract infection, site not specified: Secondary | ICD-10-CM | POA: Diagnosis present

## 2021-06-26 DIAGNOSIS — E7800 Pure hypercholesterolemia, unspecified: Secondary | ICD-10-CM | POA: Diagnosis present

## 2021-06-26 DIAGNOSIS — Z87891 Personal history of nicotine dependence: Secondary | ICD-10-CM

## 2021-06-26 DIAGNOSIS — Z823 Family history of stroke: Secondary | ICD-10-CM

## 2021-06-26 DIAGNOSIS — Z887 Allergy status to serum and vaccine status: Secondary | ICD-10-CM

## 2021-06-26 DIAGNOSIS — A419 Sepsis, unspecified organism: Secondary | ICD-10-CM | POA: Diagnosis not present

## 2021-06-26 DIAGNOSIS — Z820 Family history of epilepsy and other diseases of the nervous system: Secondary | ICD-10-CM | POA: Diagnosis not present

## 2021-06-26 DIAGNOSIS — R531 Weakness: Secondary | ICD-10-CM

## 2021-06-26 DIAGNOSIS — F411 Generalized anxiety disorder: Secondary | ICD-10-CM | POA: Diagnosis present

## 2021-06-26 DIAGNOSIS — Z8249 Family history of ischemic heart disease and other diseases of the circulatory system: Secondary | ICD-10-CM

## 2021-06-26 DIAGNOSIS — N1832 Chronic kidney disease, stage 3b: Secondary | ICD-10-CM | POA: Diagnosis not present

## 2021-06-26 DIAGNOSIS — R0689 Other abnormalities of breathing: Secondary | ICD-10-CM | POA: Diagnosis not present

## 2021-06-26 DIAGNOSIS — R509 Fever, unspecified: Secondary | ICD-10-CM

## 2021-06-26 DIAGNOSIS — Z96652 Presence of left artificial knee joint: Secondary | ICD-10-CM | POA: Diagnosis present

## 2021-06-26 DIAGNOSIS — Z885 Allergy status to narcotic agent status: Secondary | ICD-10-CM

## 2021-06-26 DIAGNOSIS — G9341 Metabolic encephalopathy: Secondary | ICD-10-CM | POA: Diagnosis present

## 2021-06-26 DIAGNOSIS — Z6841 Body Mass Index (BMI) 40.0 and over, adult: Secondary | ICD-10-CM | POA: Diagnosis not present

## 2021-06-26 DIAGNOSIS — K279 Peptic ulcer, site unspecified, unspecified as acute or chronic, without hemorrhage or perforation: Secondary | ICD-10-CM | POA: Diagnosis present

## 2021-06-26 DIAGNOSIS — N1831 Chronic kidney disease, stage 3a: Secondary | ICD-10-CM | POA: Diagnosis present

## 2021-06-26 DIAGNOSIS — Z1612 Extended spectrum beta lactamase (ESBL) resistance: Secondary | ICD-10-CM | POA: Diagnosis not present

## 2021-06-26 DIAGNOSIS — L282 Other prurigo: Secondary | ICD-10-CM

## 2021-06-26 DIAGNOSIS — Z66 Do not resuscitate: Secondary | ICD-10-CM | POA: Diagnosis present

## 2021-06-26 DIAGNOSIS — J449 Chronic obstructive pulmonary disease, unspecified: Secondary | ICD-10-CM

## 2021-06-26 DIAGNOSIS — Z85828 Personal history of other malignant neoplasm of skin: Secondary | ICD-10-CM

## 2021-06-26 DIAGNOSIS — I5022 Chronic systolic (congestive) heart failure: Secondary | ICD-10-CM | POA: Diagnosis present

## 2021-06-26 DIAGNOSIS — N183 Chronic kidney disease, stage 3 unspecified: Secondary | ICD-10-CM | POA: Diagnosis present

## 2021-06-26 DIAGNOSIS — E039 Hypothyroidism, unspecified: Secondary | ICD-10-CM | POA: Diagnosis present

## 2021-06-26 DIAGNOSIS — R29810 Facial weakness: Secondary | ICD-10-CM | POA: Diagnosis not present

## 2021-06-26 DIAGNOSIS — R410 Disorientation, unspecified: Secondary | ICD-10-CM | POA: Diagnosis not present

## 2021-06-26 DIAGNOSIS — Z79899 Other long term (current) drug therapy: Secondary | ICD-10-CM

## 2021-06-26 DIAGNOSIS — Z888 Allergy status to other drugs, medicaments and biological substances status: Secondary | ICD-10-CM

## 2021-06-26 DIAGNOSIS — Z8711 Personal history of peptic ulcer disease: Secondary | ICD-10-CM

## 2021-06-26 DIAGNOSIS — R000 Tachycardia, unspecified: Secondary | ICD-10-CM | POA: Diagnosis not present

## 2021-06-26 DIAGNOSIS — Z743 Need for continuous supervision: Secondary | ICD-10-CM | POA: Diagnosis not present

## 2021-06-26 DIAGNOSIS — R6510 Systemic inflammatory response syndrome (SIRS) of non-infectious origin without acute organ dysfunction: Secondary | ICD-10-CM | POA: Diagnosis not present

## 2021-06-26 DIAGNOSIS — I499 Cardiac arrhythmia, unspecified: Secondary | ICD-10-CM | POA: Diagnosis not present

## 2021-06-26 DIAGNOSIS — K219 Gastro-esophageal reflux disease without esophagitis: Secondary | ICD-10-CM | POA: Diagnosis present

## 2021-06-26 LAB — CBC WITH DIFFERENTIAL/PLATELET
Abs Immature Granulocytes: 0.09 10*3/uL — ABNORMAL HIGH (ref 0.00–0.07)
Basophils Absolute: 0.1 10*3/uL (ref 0.0–0.1)
Basophils Relative: 0 %
Eosinophils Absolute: 0 10*3/uL (ref 0.0–0.5)
Eosinophils Relative: 0 %
HCT: 38.8 % (ref 36.0–46.0)
Hemoglobin: 12.8 g/dL (ref 12.0–15.0)
Immature Granulocytes: 1 %
Lymphocytes Relative: 11 %
Lymphs Abs: 1.9 10*3/uL (ref 0.7–4.0)
MCH: 28.6 pg (ref 26.0–34.0)
MCHC: 33 g/dL (ref 30.0–36.0)
MCV: 86.6 fL (ref 80.0–100.0)
Monocytes Absolute: 1.3 10*3/uL — ABNORMAL HIGH (ref 0.1–1.0)
Monocytes Relative: 8 %
Neutro Abs: 13.7 10*3/uL — ABNORMAL HIGH (ref 1.7–7.7)
Neutrophils Relative %: 80 %
Platelets: 187 10*3/uL (ref 150–400)
RBC: 4.48 MIL/uL (ref 3.87–5.11)
RDW: 13.7 % (ref 11.5–15.5)
WBC: 17 10*3/uL — ABNORMAL HIGH (ref 4.0–10.5)
nRBC: 0 % (ref 0.0–0.2)

## 2021-06-26 LAB — URINALYSIS, ROUTINE W REFLEX MICROSCOPIC
Bilirubin Urine: NEGATIVE
Glucose, UA: NEGATIVE mg/dL
Hgb urine dipstick: NEGATIVE
Ketones, ur: NEGATIVE mg/dL
Nitrite: NEGATIVE
Protein, ur: 100 mg/dL — AB
Specific Gravity, Urine: 1.015 (ref 1.005–1.030)
WBC, UA: >50 WBC/hpf — ABNORMAL HIGH (ref 0–5)
pH: 5 (ref 5.0–8.0)

## 2021-06-26 LAB — COMPREHENSIVE METABOLIC PANEL
ALT: 22 U/L (ref 0–44)
AST: 27 U/L (ref 15–41)
Albumin: 4.2 g/dL (ref 3.5–5.0)
Alkaline Phosphatase: 42 U/L (ref 38–126)
Anion gap: 10 (ref 5–15)
BUN: 24 mg/dL — ABNORMAL HIGH (ref 8–23)
CO2: 25 mmol/L (ref 22–32)
Calcium: 9.8 mg/dL (ref 8.9–10.3)
Chloride: 99 mmol/L (ref 98–111)
Creatinine, Ser: 1.45 mg/dL — ABNORMAL HIGH (ref 0.44–1.00)
GFR, Estimated: 37 mL/min — ABNORMAL LOW (ref >60)
Glucose, Bld: 120 mg/dL — ABNORMAL HIGH (ref 70–99)
Potassium: 4.2 mmol/L (ref 3.5–5.1)
Sodium: 134 mmol/L — ABNORMAL LOW (ref 135–145)
Total Bilirubin: 2 mg/dL — ABNORMAL HIGH (ref 0.3–1.2)
Total Protein: 8.5 g/dL — ABNORMAL HIGH (ref 6.5–8.1)

## 2021-06-26 LAB — PROTIME-INR
INR: 1.1 (ref 0.8–1.2)
Prothrombin Time: 14.2 seconds (ref 11.4–15.2)

## 2021-06-26 LAB — LACTIC ACID, PLASMA
Lactic Acid, Venous: 1.6 mmol/L (ref 0.5–1.9)
Lactic Acid, Venous: 1.2 mmol/L (ref 0.5–1.9)

## 2021-06-26 LAB — RESP PANEL BY RT-PCR (FLU A&B, COVID) ARPGX2
Influenza A by PCR: NEGATIVE
Influenza B by PCR: NEGATIVE
SARS Coronavirus 2 by RT PCR: NEGATIVE

## 2021-06-26 LAB — APTT: aPTT: 33 seconds (ref 24–36)

## 2021-06-26 MED ORDER — MOMETASONE FURO-FORMOTEROL FUM 100-5 MCG/ACT IN AERO
2.0000 | INHALATION_SPRAY | Freq: Two times a day (BID) | RESPIRATORY_TRACT | Status: DC
  Administered 2021-06-27 – 2021-06-30 (×7): 2 via RESPIRATORY_TRACT
  Filled 2021-06-26: qty 8.8

## 2021-06-26 MED ORDER — SPIRONOLACTONE 25 MG PO TABS
25.0000 mg | ORAL_TABLET | Freq: Every day | ORAL | Status: DC
  Administered 2021-06-27 – 2021-06-30 (×4): 25 mg via ORAL
  Filled 2021-06-26 (×4): qty 1

## 2021-06-26 MED ORDER — VENLAFAXINE HCL ER 75 MG PO CP24
150.0000 mg | ORAL_CAPSULE | Freq: Every day | ORAL | Status: DC
  Administered 2021-06-27 – 2021-06-30 (×4): 150 mg via ORAL
  Filled 2021-06-26 (×4): qty 2

## 2021-06-26 MED ORDER — SERTRALINE HCL 50 MG PO TABS
50.0000 mg | ORAL_TABLET | Freq: Two times a day (BID) | ORAL | Status: DC
  Administered 2021-06-26 – 2021-06-30 (×8): 50 mg via ORAL
  Filled 2021-06-26 (×8): qty 1

## 2021-06-26 MED ORDER — LORATADINE 10 MG PO TABS
10.0000 mg | ORAL_TABLET | Freq: Every day | ORAL | Status: DC
  Administered 2021-06-27 – 2021-06-30 (×4): 10 mg via ORAL
  Filled 2021-06-26 (×4): qty 1

## 2021-06-26 MED ORDER — BUDESON-GLYCOPYRROL-FORMOTEROL 160-9-4.8 MCG/ACT IN AERO: 2.0000 | INHALATION_SPRAY | Freq: Two times a day (BID) | RESPIRATORY_TRACT | Status: DC

## 2021-06-26 MED ORDER — METOPROLOL TARTRATE 25 MG PO TABS
12.5000 mg | ORAL_TABLET | Freq: Two times a day (BID) | ORAL | Status: DC
  Administered 2021-06-26 – 2021-06-30 (×8): 12.5 mg via ORAL
  Filled 2021-06-26 (×8): qty 1

## 2021-06-26 MED ORDER — SACUBITRIL-VALSARTAN 49-51 MG PO TABS
1.0000 | ORAL_TABLET | Freq: Two times a day (BID) | ORAL | Status: DC
  Administered 2021-06-26 – 2021-06-30 (×8): 1 via ORAL
  Filled 2021-06-26 (×8): qty 1

## 2021-06-26 MED ORDER — ACETAMINOPHEN 325 MG PO TABS
650.0000 mg | ORAL_TABLET | Freq: Four times a day (QID) | ORAL | Status: DC | PRN
  Administered 2021-06-27 – 2021-06-30 (×9): 650 mg via ORAL
  Filled 2021-06-26 (×9): qty 2

## 2021-06-26 MED ORDER — ACETAMINOPHEN 650 MG RE SUPP: 650.0000 mg | Freq: Four times a day (QID) | RECTAL | Status: DC | PRN

## 2021-06-26 MED ORDER — ENOXAPARIN SODIUM 40 MG/0.4ML IJ SOSY
40.0000 mg | PREFILLED_SYRINGE | INTRAMUSCULAR | Status: DC
  Administered 2021-06-26 – 2021-06-29 (×4): 40 mg via SUBCUTANEOUS
  Filled 2021-06-26 (×4): qty 0.4

## 2021-06-26 MED ORDER — SODIUM CHLORIDE 0.9 % IV SOLN
2.0000 g | INTRAVENOUS | Status: DC
  Administered 2021-06-26 – 2021-06-28 (×3): 2 g via INTRAVENOUS
  Filled 2021-06-26 (×3): qty 20

## 2021-06-26 MED ORDER — CINACALCET HCL 30 MG PO TABS
30.0000 mg | ORAL_TABLET | ORAL | Status: DC
  Administered 2021-06-27 – 2021-06-29 (×2): 30 mg via ORAL
  Filled 2021-06-26 (×5): qty 1

## 2021-06-26 MED ORDER — LEVOCETIRIZINE DIHYDROCHLORIDE 5 MG PO TABS: 5.0000 mg | ORAL_TABLET | Freq: Every evening | ORAL | Status: DC

## 2021-06-26 MED ORDER — POLYETHYLENE GLYCOL 3350 17 G PO PACK: 17.0000 g | PACK | Freq: Every day | ORAL | Status: DC | PRN

## 2021-06-26 MED ORDER — LACTATED RINGERS IV BOLUS (SEPSIS)
1000.0000 mL | Freq: Once | INTRAVENOUS | Status: AC
  Administered 2021-06-26: 1000 mL via INTRAVENOUS

## 2021-06-26 MED ORDER — LACTATED RINGERS IV SOLN: INTRAVENOUS | Status: DC

## 2021-06-26 MED ORDER — PANTOPRAZOLE SODIUM 40 MG PO TBEC
40.0000 mg | DELAYED_RELEASE_TABLET | Freq: Every day | ORAL | Status: DC
  Administered 2021-06-27 – 2021-06-30 (×4): 40 mg via ORAL
  Filled 2021-06-26 (×4): qty 1

## 2021-06-26 MED ORDER — UMECLIDINIUM BROMIDE 62.5 MCG/ACT IN AEPB
1.0000 | INHALATION_SPRAY | Freq: Every day | RESPIRATORY_TRACT | Status: DC
  Administered 2021-06-27 – 2021-06-30 (×4): 1 via RESPIRATORY_TRACT
  Filled 2021-06-26: qty 7

## 2021-06-26 MED ORDER — TORSEMIDE 20 MG PO TABS
20.0000 mg | ORAL_TABLET | Freq: Two times a day (BID) | ORAL | 1 refills | Status: DC
Start: 2021-06-26 — End: 2022-02-27

## 2021-06-26 MED ORDER — ACETAMINOPHEN 325 MG PO TABS
650.0000 mg | ORAL_TABLET | Freq: Four times a day (QID) | ORAL | Status: DC | PRN
  Administered 2021-06-26: 650 mg via ORAL
  Filled 2021-06-26: qty 2

## 2021-06-26 MED ORDER — LEVOTHYROXINE SODIUM 88 MCG PO TABS
88.0000 ug | ORAL_TABLET | Freq: Every day | ORAL | Status: DC
  Administered 2021-06-27 – 2021-06-30 (×4): 88 ug via ORAL
  Filled 2021-06-26 (×4): qty 1

## 2021-06-26 NOTE — Assessment & Plan Note (Signed)
CKD stage III A-B. Creatinine 1.45 close to baseline 1.1-1.5. ?

## 2021-06-26 NOTE — Telephone Encounter (Signed)
? ?*  STAT* If patient is at the pharmacy, call can be transferred to refill team. ? ? ?1. Which medications need to be refilled? (please list name of each medication and dose if known) torsemide (DEMADEX) 20 MG tablet ? ?2. Which pharmacy/location (including street and city if local pharmacy) is medication to be sent to? [place], [place] ? ?3. Do they need a 30 day or 90 day supply? 90 days ? ?Per pharmacy, they did not receive refill for this med, pt needs refill asap ?

## 2021-06-26 NOTE — Progress Notes (Signed)
Elink following Code Sepsis. 

## 2021-06-26 NOTE — Assessment & Plan Note (Signed)
Hemoglobin stable. ?-Resumes Protonix ?

## 2021-06-26 NOTE — ED Triage Notes (Signed)
Pt brought to ED via [facility] for fever 100.9, possible UTI, and generalized weakness states she hasn't been able to stand up. BP 140/100. ?

## 2021-06-26 NOTE — H&P (Addendum)
History and Physical    Sheila [name]:[id number] DOB: [birth year] DOA: 06/26/2021  PCP: Junie Spencer, FNP   Patient coming from: Home  I have personally briefly reviewed patient's old medical records in [facility]  Chief Complaint: Weakness  HPI: Sheila [name] is a 81 y.o. female with medical history significant for COPD, nonischemic cardiomyopathy, congestive heart failure, hypertension, CKD 3. Patient presented to the ED with complaints of shaking and increasing weakness.  Patient's daughter is present at bedside and assists with the history.  At time of my evaluation patient is awake alert oriented and able to answer questions.  Patient's daughter reports that patient has been wheezy.  No change in cough or sputum purulence, no increasing difficulty breathing.  She also reports yesterday patient slept most of the day, and required questions to be repeated before she could answer.  Patient denies any urinary symptoms at this time.  No vomiting no loose stools.  No abdominal pain.  ED Course: Febrile to 102.  Heart rate 80s to 90.  Respiratory rate 20-28.  Blood pressure systolic 115-146.  O2 sats greater than 96% on room air. WBC 17.  Lactic acid 1.6.  UA with large leukocytes many bacteria.  Chest x-ray clear. 2 g IV ceftriaxone started.  1 L bolus given.  Hospitalist to admit for sepsis secondary to UTI.  Review of Systems: As per HPI all other systems reviewed and negative.  Past Medical History:  Diagnosis Date   Allergy    Bell's palsy    Cancer (HCC)    skin CA on face   Cataract    Chest pain 07/2009   Myoveiw stress test negative.   Depression    Diverticulosis of colon    DJD (degenerative joint disease) of knee    Dyspnea    on exertion   Family history of adverse reaction to anesthesia    sister ha nausea/vomiting   Gastric ulcer with hemorrhage 01/14/2011   GERD (gastroesophageal reflux disease)    Headache    History of fractured pelvis     Hypercholesteremia    Hypertension    Hypothyroidism    LV dysfunction    UTI (lower urinary tract infection)     Past Surgical History:  Procedure Laterality Date   APPENDECTOMY     BREAST BIOPSY Left    ESOPHAGOGASTRODUODENOSCOPY  01/14/2011   Procedure: ESOPHAGOGASTRODUODENOSCOPY (EGD);  Surgeon: Malissa Hippo, MD;  Location: [facility];  Service: Endoscopy;  Laterality: N/A;   HIP SURGERY     pelvis   JOINT REPLACEMENT     KNEE SURGERY  04/2010.   Total left knee replacement   LAPAROSCOPIC APPENDECTOMY N/A 08/01/2013   Procedure: APPENDECTOMY LAPAROSCOPIC;  Surgeon: Dalia Heading, MD;  Location: [facility] ORS;  Service: General;  Laterality: N/A;   RIGHT/LEFT HEART CATH AND CORONARY ANGIOGRAPHY N/A 10/02/2019   Procedure: RIGHT/LEFT HEART CATH AND CORONARY ANGIOGRAPHY;  Surgeon: Runell Gess, MD;  Location: [facility];  Service: Cardiovascular;  Laterality: N/A;     reports that she quit smoking about 53 years ago. Her smoking use included cigarettes. Her smokeless tobacco use includes snuff. She reports that she does not drink alcohol and does not use drugs.  Allergies  Allergen Reactions   Ibuprofen Other (See Comments)    History of bleeding ulcers - contra-indicated    Benadryl [Diphenhydramine Hcl] Other (See Comments)    Worsening depression    Lipitor [Atorvastatin] Other (See  Comments)    Body aches.   Baclofen Other (See Comments)    Loopy    Hydrocodone Nausea And Vomiting   Codeine Nausea And Vomiting   Morphine And Related Nausea And Vomiting   Tdap [Tetanus-Diphth-Acell Pertussis] Swelling    Redness and localized swelling at injection site     Family History  Problem Relation Age of Onset   Aneurysm Mother    Stroke Mother    Hypertension Mother    Cancer Father        lung   Heart disease Sister    Hip fracture Sister    Cancer Brother    Alzheimer's disease Sister    Cancer Sister        skin, uterine   Cancer Brother     Prior  to Admission medications   Medication Sig Start Date End Date Taking? Authorizing Provider  albuterol (PROVENTIL) (2.5 MG/3ML) 0.083% nebulizer solution NEBULIZE 1 VIAL EVERY 6 TO 8 HOURS AS NEEDED FOR WHEEZING OR SHORTNESS OF BREATH Patient taking differently: Take 2.5 mg by nebulization See admin instructions. NEBULIZE 1 VIAL EVERY 6 TO 8 HOURS AS NEEDED FOR WHEEZING OR SHORTNESS OF BREATH 06/10/21  Yes Hawks, Christy A, FNP  Budeson-Glycopyrrol-Formoterol (BREZTRI AEROSPHERE) 160-9-4.8 MCG/ACT AERO Inhale 2 puffs into the lungs 2 (two) times daily. 04/11/21  Yes Hawks, Christy A, FNP  Cholecalciferol (VITAMIN D3) 25 MCG (1000 UT) CAPS Take 1,000 Units by mouth daily.    Yes [provider]  cinacalcet (SENSIPAR) 30 MG tablet Take 1 tablet (30 mg total) by mouth every other day. 05/19/21  Yes Reardon, Freddi Starr, NP  CRANBERRY PO Take 1 tablet by mouth daily.   Yes [provider]  desvenlafaxine (PRISTIQ) 100 MG 24 hr tablet Take 100 mg by mouth daily. 06/16/21  Yes [provider]  ENTRESTO 49-51 MG TAKE 1 TABLET 2 TIMES A DAY 05/05/21  Yes Runell Gess, MD  gabapentin (NEURONTIN) 300 MG capsule TAKE 1 CAPSULE 3 TIMES A DAY Patient taking differently: Take 300 mg by mouth 3 (three) times daily. 06/24/21  Yes Hawks, Christy A, FNP  hydrOXYzine (VISTARIL) 25 MG capsule TAKE ONE CAPSULE THREE TIMES DAILY AS NEEDED Patient taking differently: Take 25 mg by mouth 3 (three) times daily as needed for anxiety or itching. 06/26/21  Yes Hawks, Christy A, FNP  levocetirizine (XYZAL) 5 MG tablet TAKE 1 TABLET ONCE DAILY IN THE EVENING Patient taking differently: Take 5 mg by mouth every evening. 05/27/21  Yes Hawks, Neysa Bonito A, FNP  levothyroxine (SYNTHROID) 88 MCG tablet TAKE 1 TABLET DAILY 12/17/20  Yes Jannifer Rodney A, FNP  LIVALO 4 MG TABS TAKE 1 TABLET DAILY 05/05/21  Yes Hawks, Christy A, FNP  metoprolol tartrate (LOPRESSOR) 25 MG tablet Take 0.5 tablets (12.5 mg total) by mouth 2  (two) times daily. 03/09/21  Yes Marinda Elk, MD  pantoprazole (PROTONIX) 40 MG tablet Take 1 tablet (40 mg total) by mouth daily. 04/25/21  Yes Hawks, Christy A, FNP  PROLIA 60 MG/ML SOSY injection Inject into the skin once. 02/28/21  Yes [provider]  sertraline (ZOLOFT) 100 MG tablet Take 1 tablet (100 mg total) by mouth daily. Patient taking differently: Take 50 mg by mouth 2 (two) times daily. 04/02/21  Yes Hawks, Neysa Bonito A, FNP  spironolactone (ALDACTONE) 25 MG tablet TAKE 1 TABLET ONCE DAILY 05/05/21  Yes Runell Gess, MD  Tetrahydrozoline HCl (VISINE OP) Place 1 drop into both eyes daily as needed (  dry eyes/itching).   Yes [provider]  torsemide (DEMADEX) 20 MG tablet Take 1 tablet (20 mg total) by mouth 2 (two) times daily. TAKE 2 TABLETS DAILY AS DIRECTED Strength: 20 mg 06/26/21  Yes Parke Poisson, MD  traMADol (ULTRAM) 50 MG tablet Take 2 tablets (100 mg total) by mouth every 12 (twelve) hours as needed. TAKE TWO TABLETS EVERY TWELVE HOURS AS NEEDED FOR PAIN 06/19/21  Yes Hawks, Christy A, FNP  pantoprazole (PROTONIX) 20 MG tablet Take 20 mg by mouth at bedtime. 05/28/21   [provider]    Physical Exam: Vitals:   06/26/21 1800 06/26/21 1830 06/26/21 1900 06/26/21 1926  BP: (!) 129/101 137/69 137/60   Pulse: 84 83 80   Resp: (!) 26 20 (!) 21   Temp:    98.6 F (37 C)  TempSrc:    Oral  SpO2: 97% 98% 98%   Weight:      Height:        Constitutional: NAD, calm, comfortable Vitals:   06/26/21 1800 06/26/21 1830 06/26/21 1900 06/26/21 1926  BP: (!) 129/101 137/69 137/60   Pulse: 84 83 80   Resp: (!) 26 20 (!) 21   Temp:    98.6 F (37 C)  TempSrc:    Oral  SpO2: 97% 98% 98%   Weight:      Height:       Eyes: PERRL, lids and conjunctivae normal ENMT: Mucous membranes are moist.   Neck: normal, supple, no masses, no thyromegaly Respiratory: clear to auscultation bilaterally, no wheezing, no crackles. Normal respiratory effort.  No accessory muscle use.  Cardiovascular: Regular rate and rhythm, no murmurs / rubs / gallops. No extremity edema. 2+ pedal pulses.  Abdomen: no tenderness, no masses palpated. No hepatosplenomegaly. Bowel sounds positive.  Musculoskeletal: no clubbing / cyanosis. No joint deformity upper and lower extremities. Good ROM, no contractures. Normal muscle tone.  Skin: no rashes, lesions, ulcers. No induration Neurologic: No apparent cranial nerve abnormality, moving all extremities spontaneously. Psychiatric: Normal judgment and insight. Alert and oriented x 3. Normal mood.   Labs on Admission: I have personally reviewed following labs and imaging studies  CBC: Recent Labs  Lab 06/26/21 1615  WBC 17.0*  NEUTROABS 13.7*  HGB 12.8  HCT 38.8  MCV 86.6  PLT 187   Basic Metabolic Panel: Recent Labs  Lab 06/26/21 1615  NA 134*  K 4.2  CL 99  CO2 25  GLUCOSE 120*  BUN 24*  CREATININE 1.45*  CALCIUM 9.8   Liver Function Tests: Recent Labs  Lab 06/26/21 1615  AST 27  ALT 22  ALKPHOS 42  BILITOT 2.0*  PROT 8.5*  ALBUMIN 4.2   Coagulation Profile: Recent Labs  Lab 06/26/21 1615  INR 1.1   Urine analysis:    Component Value Date/Time   COLORURINE YELLOW 06/26/2021 1540   APPEARANCEUR CLOUDY (A) 06/26/2021 1540   APPEARANCEUR Clear 07/15/2020 1006   LABSPEC 1.015 06/26/2021 1540   PHURINE 5.0 06/26/2021 1540   GLUCOSEU NEGATIVE 06/26/2021 1540   HGBUR NEGATIVE 06/26/2021 1540   BILIRUBINUR NEGATIVE 06/26/2021 1540   BILIRUBINUR Negative 07/15/2020 1006   KETONESUR NEGATIVE 06/26/2021 1540   PROTEINUR 100 (A) 06/26/2021 1540   UROBILINOGEN 0.2 07/31/2013 1442   NITRITE NEGATIVE 06/26/2021 1540   LEUKOCYTESUR LARGE (A) 06/26/2021 1540    Radiological Exams on Admission: DG Chest Port 1 View  Result Date: 06/26/2021 CLINICAL DATA:  Possible sepsis, difficulty breathing EXAM: PORTABLE CHEST 1  VIEW COMPARISON:  Previous studies including the examination of  03/06/2021 FINDINGS: Transverse diameter of heart is increased. There are no signs of pulmonary edema or focal pulmonary consolidation. There is no pleural effusion or pneumothorax. IMPRESSION: No active disease. Electronically Signed   By: Ernie Avena M.D.   On: 06/26/2021 16:31    EKG: Independently reviewed.  EKG showing artifact.  Assessment/Plan Principal Problem:   Sepsis (HCC) Active Problems:   PUD (peptic ulcer disease)   GAD (generalized anxiety disorder)   Essential hypertension, benign   Chronic kidney disease, stage 3 unspecified (HCC)   Chronic systolic CHF (congestive heart failure) (HCC)   Assessment and Plan: * Sepsis secondary to UTI (HCC) Sepsis, with fever of 102, leukocytosis of 17.  Normal lactic acid 1.6.  Chest x-ray clear.  UA showing large leukocytes and many bacteria. -Follow-up blood and urine cultures -IV ceftriaxone 2 g daily -1 L bolus given.  Hold further fluids for now.  Acute metabolic encephalopathy Daughter reports patient is not back to baseline.  Reported confusion, increased sleepiness.  Likely due to sepsis, UTI. -Obtain head CT. -Antibiotics. -Hold gabapentin for now   Chronic systolic CHF (congestive heart failure) (HCC) Stable and compensated.  Patient is supposed to be on torsemide, she ran out 2 weeks ago and has not been able to get the refill.  Last echo 02/2021, EF of 45 to 50%. -Also might for now -Resume Entresto, spironolactone, metoprolol  Chronic kidney disease, stage 3 unspecified (HCC) CKD stage III A-B. Creatinine 1.45 close to baseline 1.1-1.5.  Essential hypertension, benign Stable. -Resume Entresto, spironolactone, metoprolol  GAD (generalized anxiety disorder) Resume desvenlafaxine  PUD (peptic ulcer disease) Hemoglobin stable. -Resumes Protonix   DVT prophylaxis: Lovenox Code Status: DNR -Universal DNR form at bedside. Family Communication: youngest Daughter at bedside Disposition Plan: ~ 2  days Consults called: None Admission status: Inpt tele  I certify that at the point of admission it is my clinical judgment that the patient will require inpatient hospital care spanning beyond 2 midnights from the point of admission due to high intensity of service, high risk for further deterioration and high frequency of surveillance required.   Author: Onnie Boer, MD 06/26/2021 9:14 PM  For on call review [email or web address].

## 2021-06-26 NOTE — Assessment & Plan Note (Signed)
Sepsis, with fever of 102, leukocytosis of 17.  Normal lactic acid 1.6.  Chest x-ray clear.  UA showing large leukocytes and many bacteria. ?-Follow-up blood and urine cultures ?-IV ceftriaxone 2 g daily ?-1 L bolus given.  Hold further fluids for now. ?

## 2021-06-26 NOTE — Progress Notes (Signed)
? ?Virtual Visit  Note ?Due to COVID-19 pandemic this visit was conducted virtually. This visit type was conducted due to national recommendations for restrictions regarding the COVID-19 Pandemic (e.g. social distancing, sheltering in place) in an effort to limit this patient's exposure and mitigate transmission in our community. All issues noted in this document were discussed and addressed.  A physical exam was not performed with this format. ? ?I connected with Sheila [name] on 06/26/21 at 2:45 pm  by telephone and verified that I am speaking with the correct person using two identifiers. Sheila [name] is currently located at home and her aid and daughter  is currently with her during visit. The provider, [name], FNP is located in their office at time of visit. ? ?I discussed the limitations, risks, security and privacy concerns of performing an evaluation and management service by telephone and the availability of in person appointments. I also discussed with the patient that there may be a patient responsible charge related to this service. The patient expressed understanding and agreed to proceed. ? ?Ms. Kasler,you are scheduled for a virtual visit with your provider today.   ? ?Just as we do with appointments in the office, we must obtain your consent to participate.  Your consent will be active for this visit and any virtual visit you may have with one of our providers in the next 365 days.   ? ?If you have a MyChart account, I can also send a copy of this consent to you electronically.  All virtual visits are billed to your insurance company just like a traditional visit in the office.  As this is a virtual visit, video technology does not allow for your provider to perform a traditional examination.  This may limit your provider's ability to fully assess your condition.  If your provider identifies any concerns that need to be evaluated in person or the need to arrange testing such as labs, EKG, etc,  we will make arrangements to do so.   ? ?Although advances in technology are sophisticated, we cannot ensure that it will always work on either your end or our end.  If the connection with a video visit is poor, we may have to switch to a telephone visit.  With either a video or telephone visit, we are not always able to ensure that we have a secure connection.   I need to obtain your verbal consent now.   Are you willing to proceed with your visit today?  ? ?Sheila [name] has provided verbal consent on 06/26/2021 for a virtual visit (video or telephone). ? ? ?[name], FNP ?06/26/2021  2:47 PM ? ? ?History and Present Illness: ? ?HPI ?Pt's daughter and aid calling for increased weakness and shaking. She reports she can not stand up to go to bathroom. She feels warm, but does not have a thermostat.   ? ?Reports she is sleeping more than usually and not "acting right".  ? ?They have called EMS to take her to the ED because of her symptoms.  ? ?Review of Systems  ?Constitutional:  Positive for fever and malaise/fatigue.  ?Respiratory:  Positive for shortness of breath.   ? ? ?Observations/Objective: ?Generalized weakness  ? ?Assessment and Plan: ?1. Weakness ? ?2. Fever, unspecified fever cause ? ?Waiting on EMS ?Go to ED to have work up ?Make follow up after hospital  ? ? ?  ?I discussed the assessment and treatment plan with the patient. The patient was  provided an opportunity to ask questions and all were answered. The patient agreed with the plan and demonstrated an understanding of the instructions. ?  ?The patient was advised to call back or seek an in-person evaluation if the symptoms worsen or if the condition fails to improve as anticipated. ? ?The above assessment and management plan was discussed with the patient. The patient verbalized understanding of and has agreed to the management plan. Patient is aware to call the clinic if symptoms persist or worsen. Patient is aware when to return to the clinic for  a follow-up visit. Patient educated on when it is appropriate to go to the emergency department.  ? ?Time call ended: 2:57 pm   ? ?I provided 12 minutes of  non face-to-face time during this encounter. ? ? ? ?[name], FNP ? ? ?

## 2021-06-26 NOTE — Assessment & Plan Note (Signed)
Resume desvenlafaxine ?

## 2021-06-26 NOTE — Assessment & Plan Note (Addendum)
Daughter reports patient is not back to baseline.  Reported confusion, increased sleepiness.  Likely due to sepsis, UTI. ?-Obtain head CT. ?-Antibiotics. ?-Hold gabapentin for now ? ?

## 2021-06-26 NOTE — Assessment & Plan Note (Signed)
Stable. ?-Resume Entresto, spironolactone, metoprolol ?

## 2021-06-26 NOTE — Assessment & Plan Note (Signed)
Stable and compensated.  Patient is supposed to be on torsemide, she ran out 2 weeks ago and has not been able to get the refill.  Last echo 02/2021, EF of 45 to 50%. ?-Also might for now ?-Resume Entresto, spironolactone, metoprolol ?

## 2021-06-26 NOTE — ED Provider Notes (Signed)
?[facility] ?Provider Note ? ? ?CSN: [id number] ?Arrival date & time: 06/26/21  1559 ? ?  ? ?History ? ?Chief Complaint  ?Patient presents with  ? Fever  ? ? ?Sheila [name] is a 80 y.o. female brought in by EMS with chief complaint of fever.  Patient appears altered, unable to get an adequate history.  States she is unable to take Tylenol due to unknown reason.  Lives with somebody but is unable to specify who this is.  Was initially brought in as a possible stroke by EMS with possible facial asymmetry.  None appreciated on initial exam.  Patient appears to be audibly wheezing and has a fever of 102.  Hx of COPD.  Not on O2 at home, is in the process of obtaining this. ? ?Extensive Hx includes of hypothyroidism, diverticulosis, gastric ulcer with previous hemorrhage, PUD, anxiety, hyperlipidemia, HTN, depression, benzodiazepine dependence, metabolic syndrome, smokeless tobacco use, left ventricular dysfunction, nonischemic cardiomyopathy, primary hyperparathyroidism, CKD stage III, chronic systolic CHF ? ?The history is provided by the patient and medical records.  ?Fever ? ?  ? ?Home Medications ?Prior to Admission medications   ?Medication Sig Start Date End Date Taking? Authorizing Provider  ?albuterol (PROVENTIL) (2.5 MG/3ML) 0.083% nebulizer solution NEBULIZE 1 VIAL EVERY 6 TO 8 HOURS AS NEEDED FOR WHEEZING OR SHORTNESS OF BREATH 06/10/21  Yes Sharion Balloon, FNP  ?Budeson-Glycopyrrol-Formoterol (BREZTRI AEROSPHERE) 160-9-4.8 MCG/ACT AERO Inhale 2 puffs into the lungs 2 (two) times daily. 04/11/21  Yes Sharion Balloon, FNP  ?Cholecalciferol (VITAMIN D3) 25 MCG (1000 UT) CAPS Take 1,000 Units by mouth daily.    Yes [provider]  ?cinacalcet (SENSIPAR) 30 MG tablet Take 1 tablet (30 mg total) by mouth every other day. 05/19/21  Yes Brita Romp, NP  ?desvenlafaxine (PRISTIQ) 100 MG 24 hr tablet Take 100 mg by mouth daily. 06/16/21  Yes [provider]  ?ENTRESTO 49-51  MG TAKE 1 TABLET 2 TIMES A DAY 05/05/21   Lorretta Harp, MD  ?gabapentin (NEURONTIN) 300 MG capsule TAKE 1 CAPSULE 3 TIMES A DAY 06/24/21   Sharion Balloon, FNP  ?hydrOXYzine (VISTARIL) 25 MG capsule TAKE ONE CAPSULE THREE TIMES DAILY AS NEEDED 06/26/21   Sharion Balloon, FNP  ?levocetirizine (XYZAL) 5 MG tablet TAKE 1 TABLET ONCE DAILY IN THE EVENING 05/27/21   Sharion Balloon, FNP  ?levothyroxine (SYNTHROID) 88 MCG tablet TAKE 1 TABLET DAILY 12/17/20   Sharion Balloon, FNP  ?LIVALO 4 MG TABS TAKE 1 TABLET DAILY 05/05/21   Sharion Balloon, FNP  ?metoprolol tartrate (LOPRESSOR) 25 MG tablet Take 0.5 tablets (12.5 mg total) by mouth 2 (two) times daily. 03/09/21   Charlynne Cousins, MD  ?pantoprazole (PROTONIX) 20 MG tablet Take 20 mg by mouth at bedtime. 05/28/21   [provider]  ?pantoprazole (PROTONIX) 40 MG tablet Take 1 tablet (40 mg total) by mouth daily. 04/25/21   Sharion Balloon, FNP  ?PROLIA 60 MG/ML SOSY injection Inject into the skin once. 02/28/21   [provider]  ?sertraline (ZOLOFT) 100 MG tablet Take 1 tablet (100 mg total) by mouth daily. 04/02/21   Sharion Balloon, FNP  ?spironolactone (ALDACTONE) 25 MG tablet TAKE 1 TABLET ONCE DAILY 05/05/21   Lorretta Harp, MD  ?Tetrahydrozoline HCl (VISINE OP) Place 1 drop into both eyes daily as needed (dry eyes/itching).    [provider]  ?torsemide (DEMADEX) 20 MG tablet Take 1 tablet (20 mg  total) by mouth 2 (two) times daily. TAKE 2 TABLETS DAILY AS DIRECTED Strength: 20 mg 06/26/21   Elouise Munroe, MD  ?traMADol (ULTRAM) 50 MG tablet Take 2 tablets (100 mg total) by mouth every 12 (twelve) hours as needed. TAKE TWO TABLETS EVERY TWELVE HOURS AS NEEDED FOR PAIN 06/19/21   Sharion Balloon, FNP  ?   ? ?Allergies    ?Ibuprofen, Benadryl [diphenhydramine hcl], Lipitor [atorvastatin], Baclofen, Hydrocodone, Codeine, Morphine and related, and Tdap [tetanus-diphth-acell pertussis]   ? ?Review of Systems   ?Review of Systems   ?Unable to perform ROS: Mental status change  ?Constitutional:  Positive for fever.  ? ?Physical Exam ?Updated Vital Signs ?BP 137/69   Pulse 83   Temp (!) 102 ?F (38.9 ?C) (Oral)   Resp 20   Ht '4\' 11"'$  (1.499 m)   Wt 107.5 kg   SpO2 98%   BMI 47.87 kg/m?  ?Physical Exam ?Vitals and nursing note reviewed.  ?Constitutional:   ?   General: She is not in acute distress. ?   Appearance: She is well-developed. She is obese. She is ill-appearing.  ?HENT:  ?   Head: Normocephalic and atraumatic.  ?Eyes:  ?   Conjunctiva/sclera: Conjunctivae normal.  ?Cardiovascular:  ?   Rate and Rhythm: Normal rate and regular rhythm.  ?   Heart sounds: No murmur heard. ?Pulmonary:  ?   Effort: Pulmonary effort is normal. No respiratory distress.  ?   Breath sounds: Normal breath sounds.  ?Abdominal:  ?   Palpations: Abdomen is soft.  ?   Tenderness: There is abdominal tenderness (Suprapubic).  ?Musculoskeletal:     ?   General: No swelling.  ?   Cervical back: Neck supple. No rigidity or tenderness.  ?   Right lower leg: No edema.  ?   Left lower leg: No edema.  ?Skin: ?   General: Skin is warm and dry.  ?   Capillary Refill: Capillary refill takes less than 2 seconds.  ?Neurological:  ?   Mental Status: She is alert. She is disoriented.  ?   Motor: Weakness present.  ?Psychiatric:     ?   Mood and Affect: Mood normal.  ? ? ?ED Results / Procedures / Treatments   ?Labs ?(all labs ordered are listed, but only abnormal results are displayed) ?Labs Reviewed  ?COMPREHENSIVE METABOLIC PANEL - Abnormal; Notable for the following components:  ?    Result Value  ? Sodium 134 (*)   ? Glucose, Bld 120 (*)   ? BUN 24 (*)   ? Creatinine, Ser 1.45 (*)   ? Total Protein 8.5 (*)   ? Total Bilirubin 2.0 (*)   ? GFR, Estimated 37 (*)   ? All other components within normal limits  ?CBC WITH DIFFERENTIAL/PLATELET - Abnormal; Notable for the following components:  ? WBC 17.0 (*)   ? Neutro Abs 13.7 (*)   ? Monocytes Absolute 1.3 (*)   ? Abs Immature  Granulocytes 0.09 (*)   ? All other components within normal limits  ?URINALYSIS, ROUTINE W REFLEX MICROSCOPIC - Abnormal; Notable for the following components:  ? APPearance CLOUDY (*)   ? Protein, ur 100 (*)   ? Leukocytes,Ua LARGE (*)   ? WBC, UA >50 (*)   ? Bacteria, UA MANY (*)   ? All other components within normal limits  ?CULTURE, BLOOD (ROUTINE X 2)  ?CULTURE, BLOOD (ROUTINE X 2)  ?URINE CULTURE  ?RESP PANEL BY RT-PCR (FLU A&B,  COVID) [id number]  ?LACTIC ACID, PLASMA  ?LACTIC ACID, PLASMA  ?PROTIME-INR  ?APTT  ? ? ?EKG ?EKG Interpretation ? ?Date/Time:  Thursday Jun 26 2021 16:10:10 EDT ?Ventricular Rate:  88 ?PR Interval:    ?QRS Duration: 110 ?QT Interval:  382 ?QTC Calculation: 463 ?R Axis:   21 ?Text Interpretation: duplicate Confirmed by Davonna Belling [phone]) on 06/26/2021 5:08:38 PM ? ?Radiology ?DG Chest Port 1 View ? ?Result Date: 06/26/2021 ?CLINICAL DATA:  Possible sepsis, difficulty breathing EXAM: PORTABLE CHEST 1 VIEW COMPARISON:  Previous studies including the examination of 03/06/2021 FINDINGS: Transverse diameter of heart is increased. There are no signs of pulmonary edema or focal pulmonary consolidation. There is no pleural effusion or pneumothorax. IMPRESSION: No active disease. Electronically Signed   By: Elmer Picker M.D.   On: 06/26/2021 16:31   ? ?Procedures ?Marland KitchenCritical Care ?Performed by: Prince Rome, PA-C ?Authorized by: Prince Rome, PA-C  ? ?Critical care provider statement:  ?  Critical care time (minutes):  45 ?  Critical care was necessary to treat or prevent imminent or life-threatening deterioration of the following conditions:  Sepsis ?  Critical care was time spent personally by me on the following activities:  Development of treatment plan with patient or surrogate, discussions with consultants, evaluation of patient's response to treatment, examination of patient, ordering and review of laboratory studies, ordering and review of radiographic studies,  ordering and performing treatments and interventions, pulse oximetry, re-evaluation of patient's condition, review of old charts, obtaining history from patient or surrogate, vascular access procedures and blood draw

## 2021-06-27 ENCOUNTER — Encounter (HOSPITAL_COMMUNITY): Payer: Self-pay | Admitting: Internal Medicine

## 2021-06-27 DIAGNOSIS — A419 Sepsis, unspecified organism: Secondary | ICD-10-CM | POA: Diagnosis not present

## 2021-06-27 DIAGNOSIS — N390 Urinary tract infection, site not specified: Secondary | ICD-10-CM | POA: Diagnosis not present

## 2021-06-27 LAB — BASIC METABOLIC PANEL
Anion gap: 8 (ref 5–15)
BUN: 22 mg/dL (ref 8–23)
CO2: 26 mmol/L (ref 22–32)
Calcium: 9.2 mg/dL (ref 8.9–10.3)
Chloride: 103 mmol/L (ref 98–111)
Creatinine, Ser: 1.3 mg/dL — ABNORMAL HIGH (ref 0.44–1.00)
GFR, Estimated: 42 mL/min — ABNORMAL LOW (ref >60)
Glucose, Bld: 139 mg/dL — ABNORMAL HIGH (ref 70–99)
Potassium: 4 mmol/L (ref 3.5–5.1)
Sodium: 137 mmol/L (ref 135–145)

## 2021-06-27 LAB — CBC
HCT: 33.7 % — ABNORMAL LOW (ref 36.0–46.0)
Hemoglobin: 10.8 g/dL — ABNORMAL LOW (ref 12.0–15.0)
MCH: 28.2 pg (ref 26.0–34.0)
MCHC: 32 g/dL (ref 30.0–36.0)
MCV: 88 fL (ref 80.0–100.0)
Platelets: 135 10*3/uL — ABNORMAL LOW (ref 150–400)
RBC: 3.83 MIL/uL — ABNORMAL LOW (ref 3.87–5.11)
RDW: 13.7 % (ref 11.5–15.5)
WBC: 11.8 10*3/uL — ABNORMAL HIGH (ref 4.0–10.5)
nRBC: 0 % (ref 0.0–0.2)

## 2021-06-27 MED ORDER — ALBUTEROL SULFATE (2.5 MG/3ML) 0.083% IN NEBU: 2.5000 mg | INHALATION_SOLUTION | RESPIRATORY_TRACT | Status: DC | PRN

## 2021-06-27 MED ORDER — ALPRAZOLAM 0.5 MG PO TABS
0.5000 mg | ORAL_TABLET | Freq: Three times a day (TID) | ORAL | Status: DC | PRN
  Administered 2021-06-27 – 2021-06-30 (×6): 0.5 mg via ORAL
  Filled 2021-06-27 (×6): qty 1

## 2021-06-27 MED ORDER — ONDANSETRON HCL 4 MG/2ML IJ SOLN
4.0000 mg | Freq: Four times a day (QID) | INTRAMUSCULAR | Status: DC | PRN
Start: 2021-06-27 — End: 2021-06-30
  Administered 2021-06-28: 4 mg via INTRAVENOUS
  Filled 2021-06-27 (×2): qty 2

## 2021-06-27 NOTE — TOC Initial Note (Addendum)
Transition of Care (TOC) - Initial/Assessment Note  ? ? ?Patient Details  ?Name: Sheila [name] ?MRN: [id number] ?Date of Birth: 05-05-1940 ? ?Transition of Care (TOC) CM/SW Contact:    ?Boneta Lucks, RN ?Phone Number: ?06/27/2021, 1:51 PM ? ?Clinical Narrative:       Patient admitted  with sepsis. Patient has a high risk for readmission. TOC spoke with her daughter, Hilda Blades. Hilda Blades and other family members take turns staying with the patient. She also has a CNA with her 8 hours a day. She is never left alone. She walks with a walker and has a shower chair. TOC consulted for [facility] Hilda Blades is agreeable.  TOC sent referral to [facility], waiting for Judson Roch to update.  ? ?Addendum : [facility] accepted the referral for HHPT, MD aware to order.  ? ?Expected Discharge Plan: [place] ?Barriers to Discharge: Continued Medical Work up ? ? ?Patient Goals and CMS Choice ?Patient states their goals for this hospitalization and ongoing recovery are:: to go home. ?CMS Medicare.gov Compare Post Acute Care list provided to:: Patient Represenative (must comment) ?Choice offered to / list presented to : Adult Children ? ?Expected Discharge Plan and Services ?Expected Discharge Plan: [place] ?  ?   ?Living arrangements for the past 2 months: [place] ?       ?  ?Prior Living Arrangements/Services ?Living arrangements for the past 2 months: [place] ?Lives with:: Adult Children ?   ?  ?  ?Current home services: DME ?  ?Activities of Daily Living ?Home Assistive Devices/Equipment: Gilford Rile (specify type) ?ADL Screening (condition at time of admission) ?Patient's cognitive ability adequate to safely complete daily activities?: Yes ?Is the patient deaf or have difficulty hearing?: No ?Does the patient have difficulty seeing, even when wearing glasses/contacts?: Yes ?Does the patient have difficulty concentrating, remembering, or making decisions?: Yes ?Patient able to express need for assistance  with ADLs?: Yes ?Does the patient have difficulty dressing or bathing?: Yes ?Independently performs ADLs?: No ?Communication: Independent ?Dressing (OT): Needs assistance ?Is this a change from baseline?: Pre-admission baseline ?Grooming: Needs assistance ?Is this a change from baseline?: Pre-admission baseline ?Feeding: Independent ?Bathing: Needs assistance ?Is this a change from baseline?: Pre-admission baseline ?Toileting: Needs assistance ?Is this a change from baseline?: Pre-admission baseline ?In/Out Bed: Needs assistance ?Is this a change from baseline?: Pre-admission baseline ?Walks in Home: Independent ?Does the patient have difficulty walking or climbing stairs?: Yes ?Weakness of Legs: Both ?Weakness of Arms/Hands: Both ? ?   ?Alcohol / Substance Use: Not Applicable ?Psych Involvement: No (comment) ? ?Admission diagnosis:  Morbid obesity ([place]) [E66.01] ?Sepsis due to urinary tract infection ([place]) [A41.9, N39.0] ?Systemic inflammatory response syndrome (SIRS) (HCC) [R65.10] ?Sepsis ([place]) [A41.9] ?Urinary tract infection with hematuria, site unspecified [N39.0, R31.9] ?Chronic obstructive pulmonary disease, unspecified COPD type ([place]) [J44.9] ?Patient Active Problem List  ? Diagnosis Date Noted  ? Sepsis secondary to UTI ([place]) 06/26/2021  ? Acute metabolic encephalopathy 70/02/7492  ? SIRS (systemic inflammatory response syndrome) ([place]) 03/06/2021  ? Chronic systolic CHF (congestive heart failure) ([place]) 03/06/2021  ? Urinary incontinence 06/03/2020  ? Chronic kidney disease, stage 3 unspecified ([place]) 04/18/2020  ? Primary hyperparathyroidism ([place]) 04/02/2020  ? LV dysfunction 10/02/2019  ? NICM (nonischemic cardiomyopathy) ([place]) 10/02/2019  ? Shortness of breath 08/29/2019  ? Atypical chest pain 08/29/2019  ? Controlled substance agreement signed 07/26/2019  ? Osteoarthritis of right shoulder 02/24/2018  ? Smokeless tobacco use 11/11/2017  ? COPD (  chronic obstructive pulmonary disease) ([place]) 09/09/2017  ?  Unilateral primary osteoarthritis, right knee 03/04/2017  ? Spondylolisthesis of lumbar region 08/24/2016  ? Benzodiazepine dependence ([place]) 04/29/2015  ? Pain medication agreement signed 04/29/2015  ? Chronic back pain 04/29/2015  ? Osteoarthritis 04/29/2015  ? Metabolic syndrome 32/20/2542  ? Morbid obesity ([place]) 10/18/2014  ? Neuropathy 10/18/2014  ? Hypokalemia 10/18/2014  ? Depressed 09/05/2013  ? Osteoporosis 06/16/2012  ? PUD (peptic ulcer disease) 06/16/2012  ? GAD (generalized anxiety disorder) 06/16/2012  ? Hyperlipidemia 06/16/2012  ? DJD (degenerative joint disease) 06/16/2012  ? Hypercalcemia 06/16/2012  ? Essential hypertension, benign 06/16/2012  ? Gastric ulcer with hemorrhage 01/14/2011  ? Diverticulosis of colon 01/13/2011  ? Prerenal azotemia 01/13/2011  ? Leukocytosis 01/13/2011  ? Sacral insufficiency fracture 01/13/2011  ? Hypothyroidism 01/13/2011  ? Orthostatic lightheadedness 01/13/2011  ? ABNORMAL EKG 07/16/2009  ? ?PCP:  Sharion Balloon, FNP ?Pharmacy:   ?[facility] [place] ?[place]  [address] ?Phone: [phone] Fax: [phone] ? ? ?Readmission Risk Interventions ? ?  06/27/2021  ? 12:48 PM  ?Readmission Risk Prevention Plan  ?Transportation Screening Complete  ?PCP or Specialist Appt within 3-5 Days Not Complete  ?[place] or Home Care Consult Complete  ?Social Work Consult for [place] Planning/Counseling Complete  ?Palliative Care Screening Not Applicable  ?Medication Review Press photographer) Complete  ? ? ? ?

## 2021-06-27 NOTE — Progress Notes (Signed)
?PROGRESS NOTE ? ? ? ? ?Sheila [name], is a 81 y.o. female, DOB - 1940/04/17, JKD:[id number] ? ?Admit date - 06/26/2021   Admitting Physician Catrinia Racicot [place] Brick, MD ? ?Outpatient Primary MD for the patient is Sharion Balloon, FNP ? ?LOS - 1 ? ?Chief Complaint  ?Patient presents with  ? Fever  ?    ? ? ?Brief Narrative:  ?81 y.o. female with medical history significant for COPD, nonischemic cardiomyopathy, congestive heart failure, hypertension, CKD 3 admitted on 06/26/2021 with sepsis secondary to urinary/ ? ?  ?-Assessment and Plan: ?1) Sepsis secondary to UTI ---POA ?-BP and mentation improving ?-Fever curve trending down ?Continue IV Rocephin pending blood and urine culture data ? ?2)Acute metabolic encephalopathy--related to #1 above ?-CT head without acute findings ?-Mentation improving overall ?-Continue to hold gabapentin ? ?3)Chronic systolic CHF (congestive heart failure) ([place]) ? not volume overloaded ? last echo 02/2021, EF of 45 to 50%. ?-PTA had not taken torsemide for close to 2 weeks ?Continue Entresto, spironolactone, metoprolol ?-Hold torsemide in the setting of sepsis until oral intake improves significantly more ? ?Chronic kidney disease, stage 3 unspecified ([place]) ?CKD stage III A-B.  ?baseline 1.1-1.5. ?-Creatinine is down to 1.30 from 1.45 on admission ?- renally adjust medications, avoid nephrotoxic agents / dehydration  / hypotension ? ? ?Essential hypertension, benign ?Stable. ?Continue Entresto, spironolactone, metoprolol ? ?GAD (generalized anxiety disorder) ?Continue desvenlafaxine and as needed Xanax ? ?H/O PUD (peptic ulcer disease) ?Hemoglobin stable. ?Continue Protonix ? ?Disposition/Need for in-Hospital Stay- patient unable to be discharged at this time due to --- sepsis secondary to presumed urinary source requiring IV antibiotics pending further culture data ?--Possible discharge in 1 to 2 days with home health pending clinical improvement ? ?Status is: Inpatient  ? ?Disposition: The patient is  from: Home ?             Anticipated d/c is to: Home WITH hh ?             Anticipated d/c date is: 1 day ?             Patient currently is not medically stable to d/c. ?Barriers: Not Clinically Stable-  ? ?Code Status :  -  Code Status: DNR  ? ?Family Communication:   NA (patient is alert, awake and coherent)  ? ?DVT Prophylaxis  :   - SCDs   enoxaparin (LOVENOX) injection 40 mg Start: 06/26/21 2130 ? ? ?Lab Results  ?Component Value Date  ? PLT 135 (L) 06/27/2021  ? ? ?Inpatient Medications ? ?Scheduled Meds: ? cinacalcet  30 mg Oral QODAY  ? enoxaparin (LOVENOX) injection  40 mg Subcutaneous Q24H  ? levothyroxine  88 mcg Oral Daily  ? loratadine  10 mg Oral Daily  ? metoprolol tartrate  12.5 mg Oral BID  ? mometasone-formoterol  2 puff Inhalation BID  ? pantoprazole  40 mg Oral Daily  ? sacubitril-valsartan  1 tablet Oral BID  ? sertraline  50 mg Oral BID  ? spironolactone  25 mg Oral Daily  ? umeclidinium bromide  1 puff Inhalation Daily  ? venlafaxine XR  150 mg Oral Q breakfast  ? ?Continuous Infusions: ? cefTRIAXone (ROCEPHIN)  IV 2 g (06/27/21 1536)  ? ?PRN Meds:.acetaminophen **OR** acetaminophen, albuterol, ALPRAZolam, ondansetron (ZOFRAN) IV, polyethylene glycol ? ? ?Anti-infectives (From admission, onward)  ? ? Start     Dose/Rate Route Frequency Ordered Stop  ? 06/26/21 1645  cefTRIAXone (ROCEPHIN) 2 g in sodium chloride 0.9 % 100 mL  IVPB       ? 2 g ?200 mL/hr over 30 Minutes Intravenous Every 24 hours 06/26/21 1637 07/03/21 1644  ? ?  ? ?  ? ?Subjective: ?Sheila [name] today has no FURTHER fevers, no emesis,  No chest pain,   ?- ?Overall mentation improving patient more alert, denies significant pain ? ?Objective: ?Vitals:  ? 06/27/21 0750 06/27/21 0839 06/27/21 0949 06/27/21 1412  ?BP:  (!) 142/70  114/75  ?Pulse: 82 88  76  ?Resp: '17 18  19  '$ ?Temp:  98.6 ?F (37 ?C)  98 ?F (36.7 ?C)  ?TempSrc:  Oral  Oral  ?SpO2: 97% 98% 99% 98%  ?Weight:  108.2 kg    ?Height:      ? ? ?Intake/Output Summary (Last 24  hours) at 06/27/2021 1855 ?Last data filed at 06/26/2021 2110 ?Gross per 24 hour  ?Intake 659.29 ml  ?Output --  ?Net 659.29 ml  ? ?Filed Weights  ? 06/26/21 1603 06/27/21 0839  ?Weight: 107.5 kg 108.2 kg  ? ? ?Physical Exam ? ?Gen:- Awake Alert, cooperative in no acute distress ?HEENT:- [place].AT, No sclera icterus ?Neck-Supple Neck,No JVD,.  ?Lungs-  CTAB , fair symmetrical air movement ?CV- S1, S2 normal, regular  ?Abd-  +ve B.Sounds, Abd Soft, No tenderness,    ?Extremity/Skin:-  pedal pulses present  ?Psych-affect is appropriate, oriented x3 ?Neuro-generalized weakness, no new focal deficits, no tremors ? ?Data Reviewed: I have personally reviewed following labs and imaging studies ? ?CBC: ?Recent Labs  ?Lab 06/26/21 ?1615 06/27/21 ?6283  ?WBC 17.0* 11.8*  ?NEUTROABS 13.7*  --   ?HGB 12.8 10.8*  ?HCT 38.8 33.7*  ?MCV 86.6 88.0  ?PLT 187 135*  ? ?Basic Metabolic Panel: ?Recent Labs  ?Lab 06/26/21 ?1615 06/27/21 ?6629  ?NA 134* 137  ?K 4.2 4.0  ?CL 99 103  ?CO2 25 26  ?GLUCOSE 120* 139*  ?BUN 24* 22  ?CREATININE 1.45* 1.30*  ?CALCIUM 9.8 9.2  ? ?GFR: ?Estimated Creatinine Clearance: 38.3 mL/min (A) (by C-G formula based on SCr of 1.3 mg/dL (H)). ?Liver Function Tests: ?Recent Labs  ?Lab 06/26/21 ?1615  ?AST 27  ?ALT 22  ?ALKPHOS 42  ?BILITOT 2.0*  ?PROT 8.5*  ?ALBUMIN 4.2  ? ?Cardiac Enzymes: ?No results for input(s): CKTOTAL, CKMB, CKMBINDEX, TROPONINI in the last 168 hours. ?BNP (last 3 results) ?No results for input(s): PROBNP in the last 8760 hours. ?HbA1C: ?No results for input(s): HGBA1C in the last 72 hours. ?Sepsis Labs: ?'@LABRCNTIP'$ (procalcitonin:4,lacticidven:4) ?) ?Recent Results (from the past 240 hour(s))  ?Blood Culture (routine x 2)     Status: None (Preliminary result)  ? Collection Time: 06/26/21  4:15 PM  ? Specimen: Left Antecubital; Blood  ?Result Value Ref Range Status  ? Specimen Description LEFT ANTECUBITAL  Final  ? Special Requests   Final  ?  BOTTLES DRAWN AEROBIC AND ANAEROBIC Blood Culture  adequate volume  ? Culture   Final  ?  NO GROWTH < 24 HOURS ?Performed at [facility], [address] [place], [place] [address] ?  ? Report Status PENDING  Incomplete  ?Blood Culture (routine x 2)     Status: None (Preliminary result)  ? Collection Time: 06/26/21  4:15 PM  ? Specimen: BLOOD LEFT HAND  ?Result Value Ref Range Status  ? Specimen Description BLOOD LEFT HAND  Final  ? Special Requests   Final  ?  BOTTLES DRAWN AEROBIC AND ANAEROBIC Blood Culture results may not be optimal due to an inadequate volume of  blood received in culture bottles  ? Culture   Final  ?  NO GROWTH < 24 HOURS ?Performed at [facility], [address] [place], [place] [address] ?  ? Report Status PENDING  Incomplete  ?Resp Panel by RT-PCR (Flu A&B, Covid) Nasopharyngeal Swab     Status: None  ? Collection Time: 06/26/21  7:14 PM  ? Specimen: Nasopharyngeal Swab; Nasopharyngeal(NP) swabs in vial transport medium  ?Result Value Ref Range Status  ? SARS Coronavirus 2 by RT PCR NEGATIVE NEGATIVE Final  ?  Comment: (NOTE) ?SARS-CoV-2 target nucleic acids are NOT DETECTED. ? ?The SARS-CoV-2 RNA is generally detectable in upper respiratory ?specimens during the acute phase of infection. The lowest ?concentration of SARS-CoV-2 viral copies this assay can detect is ?138 copies/mL. A negative result does not preclude SARS-Cov-2 ?infection and should not be used as the sole basis for treatment or ?other patient management decisions. A negative result may occur with  ?improper specimen collection/handling, submission of specimen other ?than nasopharyngeal swab, presence of viral mutation(s) within the ?areas targeted by this assay, and inadequate number of viral ?copies(<138 copies/mL). A negative result must be combined with ?clinical observations, patient history, and epidemiological ?information. The expected result is Negative. ? ?Fact Sheet for Patients:  ?EntrepreneurPulse.com.au ? ?Fact Sheet for Healthcare Providers:   ?IncredibleEmployment.be ? ?This test is no t yet approved or cleared by the [place] FDA and  ?has been authorized for detection and/or diagnosis of SARS-CoV-2 by ?FDA under an Eme

## 2021-06-27 NOTE — ED Notes (Signed)
Pt alert, NAD, calm, interactive, resps e/u, speaking clearly. Denies pain, sob or nausea.  ?

## 2021-06-28 DIAGNOSIS — A419 Sepsis, unspecified organism: Secondary | ICD-10-CM | POA: Diagnosis not present

## 2021-06-28 DIAGNOSIS — N390 Urinary tract infection, site not specified: Secondary | ICD-10-CM | POA: Diagnosis not present

## 2021-06-28 NOTE — Progress Notes (Signed)
?PROGRESS NOTE ? ? ? ? ?Sheila [name], is a 81 y.o. female, DOB - [birth year], [id number] ? ?Admit date - 06/26/2021   Admitting Physician Billye Nydam Denton Brick, MD ? ?Outpatient Primary MD for the patient is Sharion Balloon, FNP ? ?LOS - 2 ? ?Chief Complaint  ?Patient presents with  ? Fever  ?    ? ? ?Brief Narrative:  ?81 y.o. female with medical history significant for COPD, nonischemic cardiomyopathy, congestive heart failure, hypertension, CKD 3 admitted on 06/26/2021 with sepsis secondary to urinary/ ? ?  ?-Assessment and Plan: ?1) Sepsis secondary to gram-negative rod UTI ---POA ?No further fevers, hemodynamically improved, mentation back to baseline ?-Urine culture from 06/26/2021 with gram-negative rods final ID and sensitivity pending ?Continue IV Rocephin pending blood and urine culture data ? ?2)Acute metabolic encephalopathy--related to #1 above ?-CT head without acute findings ?-Mentation improving overall ?-Continue to hold gabapentin ? ?3)Chronic systolic CHF (congestive heart failure) ([place]) ? not volume overloaded ? last echo 02/2021, EF of 45 to 50%. ?-PTA had not taken torsemide for close to 2 weeks ?Continue Entresto, spironolactone, metoprolol ?-Hold torsemide in the setting of sepsis until oral intake improves significantly more ? ?Chronic kidney disease, stage 3 unspecified ([place]) ?CKD stage III A-B.  ?baseline 1.1-1.5. ?-Creatinine is down to 1.30 from 1.45 on admission ?- renally adjust medications, avoid nephrotoxic agents / dehydration  / hypotension ? ? ?Essential hypertension, benign ?Stable. ?Continue Entresto, spironolactone, metoprolol ? ?GAD (generalized anxiety disorder) ?Continue desvenlafaxine and as needed Xanax ? ?H/O PUD (peptic ulcer disease) ?Hemoglobin stable. ?Continue Protonix ? ?Disposition/Need for in-Hospital Stay- patient unable to be discharged at this time due to --- sepsis secondary to presumed urinary source requiring IV antibiotics pending further culture data ?--Possible  discharge in a.m. if ID and sensitivities of gram-negative rod is available and patient continues to improve clinically ? ?Status is: Inpatient  ? ?Disposition: The patient is from: Home ?             Anticipated d/c is to: Home WITH hh ?             Anticipated d/c date is: 1 day ?             Patient currently is not medically stable to d/c. ?Barriers: Not Clinically Stable-  ? ?Code Status :  -  Code Status: DNR  ? ?Family Communication:  (patient is alert, awake and coherent)  ?-Called and updated daughter Hilda Blades at [phone] ? ?DVT Prophylaxis  :   - SCDs   enoxaparin (LOVENOX) injection 40 mg Start: 06/26/21 2130 ? ? ?Lab Results  ?Component Value Date  ? PLT 135 (L) 06/27/2021  ? ?Inpatient Medications ? ?Scheduled Meds: ? cinacalcet  30 mg Oral QODAY  ? enoxaparin (LOVENOX) injection  40 mg Subcutaneous Q24H  ? levothyroxine  88 mcg Oral Daily  ? loratadine  10 mg Oral Daily  ? metoprolol tartrate  12.5 mg Oral BID  ? mometasone-formoterol  2 puff Inhalation BID  ? pantoprazole  40 mg Oral Daily  ? sacubitril-valsartan  1 tablet Oral BID  ? sertraline  50 mg Oral BID  ? spironolactone  25 mg Oral Daily  ? umeclidinium bromide  1 puff Inhalation Daily  ? venlafaxine XR  150 mg Oral Q breakfast  ? ?Continuous Infusions: ? cefTRIAXone (ROCEPHIN)  IV 2 g (06/28/21 1507)  ? ?PRN Meds:.acetaminophen **OR** acetaminophen, albuterol, ALPRAZolam, ondansetron (ZOFRAN) IV, polyethylene glycol ? ? ?Anti-infectives (From admission, onward)  ? ? Start  Dose/Rate Route Frequency Ordered Stop  ? 06/26/21 1645  cefTRIAXone (ROCEPHIN) 2 g in sodium chloride 0.9 % 100 mL IVPB       ? 2 g ?200 mL/hr over 30 Minutes Intravenous Every 24 hours 06/26/21 1637 07/03/21 1644  ? ?  ? ?  ?Subjective: ?Sheila [name] today has no FURTHER fevers, no emesis,  No chest pain,   ?- ?Oral intake is improving ?Had episode of loose stool today ?Called and updated daughter Hilda Blades at [phone] ? ?Objective: ?Vitals:  ? 06/27/21 1932  06/27/21 2148 06/28/21 0160 06/28/21 0804  ?BP:  (!) 144/67 (!) 165/72   ?Pulse:  74 73   ?Resp:  19 19   ?Temp:  98.7 ?F (37.1 ?C) 98.3 ?F (36.8 ?C)   ?TempSrc:  Oral Oral   ?SpO2: 94% 96% 95% 96%  ?Weight:      ?Height:      ? ? ?Intake/Output Summary (Last 24 hours) at 06/28/2021 1756 ?Last data filed at 06/28/2021 1200 ?Gross per 24 hour  ?Intake 1300.05 ml  ?Output 450 ml  ?Net 850.05 ml  ? ?Filed Weights  ? 06/26/21 1603 06/27/21 0839  ?Weight: 107.5 kg 108.2 kg  ? ? ?Physical Exam ? ?Gen:- Awake Alert, cooperative in no acute distress ?HEENT:- [place].AT, No sclera icterus ?Neck-Supple Neck,No JVD,.  ?Lungs-  CTAB , fair symmetrical air movement ?CV- S1, S2 normal, regular  ?Abd-  +ve B.Sounds, Abd Soft, No tenderness, no CVA area tenderness ?Extremity/Skin:-  pedal pulses present  ?Psych-affect is appropriate, oriented x3 ?Neuro-generalized weakness, no new focal deficits, no tremors ? ?Data Reviewed: I have personally reviewed following labs and imaging studies ? ?CBC: ?Recent Labs  ?Lab 06/26/21 ?1615 06/27/21 ?1093  ?WBC 17.0* 11.8*  ?NEUTROABS 13.7*  --   ?HGB 12.8 10.8*  ?HCT 38.8 33.7*  ?MCV 86.6 88.0  ?PLT 187 135*  ? ?Basic Metabolic Panel: ?Recent Labs  ?Lab 06/26/21 ?1615 06/27/21 ?2355  ?NA 134* 137  ?K 4.2 4.0  ?CL 99 103  ?CO2 25 26  ?GLUCOSE 120* 139*  ?BUN 24* 22  ?CREATININE 1.45* 1.30*  ?CALCIUM 9.8 9.2  ? ?GFR: ?Estimated Creatinine Clearance: 38.3 mL/min (A) (by C-G formula based on SCr of 1.3 mg/dL (H)). ?Liver Function Tests: ?Recent Labs  ?Lab 06/26/21 ?1615  ?AST 27  ?ALT 22  ?ALKPHOS 42  ?BILITOT 2.0*  ?PROT 8.5*  ?ALBUMIN 4.2  ? ?Cardiac Enzymes: ?No results for input(s): CKTOTAL, CKMB, CKMBINDEX, TROPONINI in the last 168 hours. ?BNP (last 3 results) ?No results for input(s): PROBNP in the last 8760 hours. ?HbA1C: ?No results for input(s): HGBA1C in the last 72 hours. ?Sepsis Labs: ?'@LABRCNTIP'$ (procalcitonin:4,lacticidven:4) ?) ?Recent Results (from the past 240 hour(s))  ?Blood Culture  (routine x 2)     Status: None (Preliminary result)  ? Collection Time: 06/26/21  4:15 PM  ? Specimen: Left Antecubital; Blood  ?Result Value Ref Range Status  ? Specimen Description LEFT ANTECUBITAL  Final  ? Special Requests   Final  ?  BOTTLES DRAWN AEROBIC AND ANAEROBIC Blood Culture adequate volume  ? Culture   Final  ?  NO GROWTH 2 DAYS ?Performed at [facility], [address] [place], [place] [address] ?  ? Report Status PENDING  Incomplete  ?Blood Culture (routine x 2)     Status: None (Preliminary result)  ? Collection Time: 06/26/21  4:15 PM  ? Specimen: BLOOD LEFT HAND  ?Result Value Ref Range Status  ? Specimen Description BLOOD LEFT HAND  Final  ? Special Requests   Final  ?  BOTTLES DRAWN AEROBIC AND ANAEROBIC Blood Culture results may not be optimal due to an inadequate volume of blood received in culture bottles  ? Culture   Final  ?  NO GROWTH 2 DAYS ?Performed at [facility], [address], [place], [place] [address] ?  ? Report Status PENDING  Incomplete  ?Urine Culture     Status: Abnormal (Preliminary result)  ? Collection Time: 06/26/21  4:40 PM  ? Specimen: In/Out Cath Urine  ?Result Value Ref Range Status  ? Specimen Description   Final  ?  IN/OUT CATH URINE ?Performed at [facility], [address] [place] [address], [place], [place] [address] ?  ? Special Requests   Final  ?  NONE ?Performed at [facility], [address] [place], [place] [address] ?  ? Culture >=100,000 COLONIES/mL GRAM NEGATIVE RODS (A)  Final  ? Report Status PENDING  Incomplete  ?Resp Panel by RT-PCR (Flu A&B, Covid) Nasopharyngeal Swab     Status: None  ? Collection Time: 06/26/21  7:14 PM  ? Specimen: Nasopharyngeal Swab; Nasopharyngeal(NP) swabs in vial transport medium  ?Result Value Ref Range Status  ? SARS Coronavirus 2 by RT PCR NEGATIVE NEGATIVE Final  ?  Comment: (NOTE) ?SARS-CoV-2 target nucleic acids are NOT DETECTED. ? ?The SARS-CoV-2 RNA is generally detectable in upper respiratory ?specimens during the acute phase of  infection. The lowest ?concentration of SARS-CoV-2 viral copies this assay can detect is ?138 copies/mL. A negative result does not preclude SARS-Cov-2 ?infection and should not be used as the sole basis for treatment

## 2021-06-29 DIAGNOSIS — N390 Urinary tract infection, site not specified: Secondary | ICD-10-CM | POA: Diagnosis not present

## 2021-06-29 DIAGNOSIS — A419 Sepsis, unspecified organism: Secondary | ICD-10-CM | POA: Diagnosis not present

## 2021-06-29 LAB — CBC
HCT: 37.1 % (ref 36.0–46.0)
Hemoglobin: 12.1 g/dL (ref 12.0–15.0)
MCH: 27.3 pg (ref 26.0–34.0)
MCHC: 32.6 g/dL (ref 30.0–36.0)
MCV: 83.7 fL (ref 80.0–100.0)
Platelets: 164 10*3/uL (ref 150–400)
RBC: 4.43 MIL/uL (ref 3.87–5.11)
RDW: 13.2 % (ref 11.5–15.5)
WBC: 9 10*3/uL (ref 4.0–10.5)
nRBC: 0 % (ref 0.0–0.2)

## 2021-06-29 LAB — BASIC METABOLIC PANEL
Anion gap: 11 (ref 5–15)
BUN: 14 mg/dL (ref 8–23)
CO2: 22 mmol/L (ref 22–32)
Calcium: 9.6 mg/dL (ref 8.9–10.3)
Chloride: 105 mmol/L (ref 98–111)
Creatinine, Ser: 0.99 mg/dL (ref 0.44–1.00)
GFR, Estimated: 58 mL/min — ABNORMAL LOW (ref >60)
Glucose, Bld: 113 mg/dL — ABNORMAL HIGH (ref 70–99)
Potassium: 3.6 mmol/L (ref 3.5–5.1)
Sodium: 138 mmol/L (ref 135–145)

## 2021-06-29 LAB — URINE CULTURE: Culture: >=100000 — AB

## 2021-06-29 MED ORDER — LOPERAMIDE HCL 2 MG PO CAPS
2.0000 mg | ORAL_CAPSULE | Freq: Four times a day (QID) | ORAL | Status: DC | PRN
Start: 2021-06-29 — End: 2021-06-30
  Administered 2021-06-29: 2 mg via ORAL
  Filled 2021-06-29: qty 1

## 2021-06-29 MED ORDER — POLYVINYL ALCOHOL 1.4 % OP SOLN: 2.0000 [drp] | Freq: Three times a day (TID) | OPHTHALMIC | Status: DC | PRN

## 2021-06-29 MED ORDER — FOSFOMYCIN TROMETHAMINE 3 G PO PACK: 3.0000 g | PACK | Freq: Once | ORAL | Status: DC

## 2021-06-29 MED ORDER — SODIUM CHLORIDE 0.9 % IV SOLN
1.0000 g | INTRAVENOUS | Status: DC
  Administered 2021-06-29: 1000 mg via INTRAVENOUS
  Filled 2021-06-29 (×3): qty 1

## 2021-06-29 NOTE — Progress Notes (Signed)
?PROGRESS NOTE ? ? ? ? ?Sheila [name], is a 81 y.o. female, DOB - June 09, 1940, [id number] ? ?Admit date - 06/26/2021   Admitting Physician Sheila Kruk Denton Brick, MD ? ?Outpatient Primary MD for the patient is Sheila Balloon, FNP ? ?LOS - 3 ? ?Chief Complaint  ?Patient presents with  ? Fever  ?    ? ? ?Brief Narrative:  ?81 y.o. female with medical history significant for COPD, nonischemic cardiomyopathy, congestive heart failure, hypertension, CKD 3 admitted on 06/26/2021 with sepsis secondary to urinary/ ? ?  ?-Assessment and Plan: ?1) Sepsis secondary to ESBL E. coli UTI ---POA ?No further fevers, hemodynamically improved, mentation back to baseline ?-Urine culture from 06/26/2021 with ESBL E. Coli ?-Okay to stop IV Rocephin and treat empirically with  ertapenem ?-Reluctant to treat with Bactrim as patient is on Entresto with risk of AKI and hyperkalemia with this combo ? ?2)Acute metabolic encephalopathy--related to #1 above ?-CT head without acute findings ?-Mentation improving overall ?-Continue to hold gabapentin ? ?3)Chronic systolic CHF (congestive heart failure) ([place]) ? not volume overloaded ? last echo 02/2021, EF of 45 to 50%. ?-PTA had not taken torsemide for close to 2 weeks ?Continue Entresto, spironolactone, metoprolol ?-Hold torsemide in the setting of sepsis until oral intake improves significantly more ? ?Chronic kidney disease, stage 3 unspecified ([place]) ?CKD stage III A-B.  ?baseline 1.1-1.5. ?-Creatinine is down to 0.99 from 1.45 on admission ?- renally adjust medications, avoid nephrotoxic agents / dehydration  / hypotension ? ? ?Essential hypertension, benign ?Stable. ?Continue Entresto, spironolactone, metoprolol ? ?GAD (generalized anxiety disorder) ?Continue desvenlafaxine and as needed Xanax ? ?H/O PUD (peptic ulcer disease) ?Hemoglobin stable. ?Continue Protonix ? ?Disposition/Need for in-Hospital Stay- patient unable to be discharged at this time due to --- sepsis secondary to presumed urinary source  requiring IV antibiotics pending further culture data ?--Possible discharge in a.m. after additional dose of ertapenem given ESBL E. coli organism ? ?Status is: Inpatient  ? ?Disposition: The patient is from: Home ?             Anticipated d/c is to: Home WITH hh ?             Anticipated d/c date is: 1 day ?             Patient currently is not medically stable to d/c. ?Barriers: Not Clinically Stable-  ? ?Code Status :  -  Code Status: DNR  ? ?Family Communication:  (patient is alert, awake and coherent)  ?-Called and updated daughter Sheila [name] at [phone] ? ?DVT Prophylaxis  :   - SCDs   enoxaparin (LOVENOX) injection 40 mg Start: 06/26/21 2130 ? ? ?Lab Results  ?Component Value Date  ? PLT 164 06/29/2021  ? ?Inpatient Medications ? ?Scheduled Meds: ? cinacalcet  30 mg Oral QODAY  ? enoxaparin (LOVENOX) injection  40 mg Subcutaneous Q24H  ? levothyroxine  88 mcg Oral Daily  ? loratadine  10 mg Oral Daily  ? metoprolol tartrate  12.5 mg Oral BID  ? mometasone-formoterol  2 puff Inhalation BID  ? pantoprazole  40 mg Oral Daily  ? sacubitril-valsartan  1 tablet Oral BID  ? sertraline  50 mg Oral BID  ? spironolactone  25 mg Oral Daily  ? umeclidinium bromide  1 puff Inhalation Daily  ? venlafaxine XR  150 mg Oral Q breakfast  ? ?Continuous Infusions: ? ertapenem 1,000 mg (06/29/21 1451)  ? ?PRN Meds:.acetaminophen **OR** acetaminophen, albuterol, ALPRAZolam, loperamide, ondansetron (ZOFRAN) IV, polyethylene glycol,  polyvinyl alcohol ? ? ?Anti-infectives (From admission, onward)  ? ? Start     Dose/Rate Route Frequency Ordered Stop  ? 06/29/21 1400  ertapenem (INVANZ) 1,000 mg in sodium chloride 0.9 % 100 mL IVPB       ? 1 g ?200 mL/hr over 30 Minutes Intravenous Every 24 hours 06/29/21 1255    ? 06/29/21 1315  fosfomycin (MONUROL) packet 3 g  Status:  Discontinued       ? 3 g Oral  Once 06/29/21 1229 06/29/21 1255  ? 06/26/21 1645  cefTRIAXone (ROCEPHIN) 2 g in sodium chloride 0.9 % 100 mL IVPB  Status:   Discontinued       ? 2 g ?200 mL/hr over 30 Minutes Intravenous Every 24 hours 06/26/21 1637 06/29/21 1255  ? ?  ? ?  ?Subjective: ?Sheila [name] has no FURTHER fevers, no emesis,  No chest pain,   ?- ? daughter Sheila [name] and other family members at bedside, questions answered ?-Patient reports loose stools ? ?Objective: ?Vitals:  ? 06/28/21 2135 06/29/21 0811 06/29/21 0945 06/29/21 1335  ?BP: (!) 126/98  138/73 (!) 141/89  ?Pulse: 77  89 69  ?Resp: 20   18  ?Temp: 98.7 ?F (37.1 ?C)   97.6 ?F (36.4 ?C)  ?TempSrc: Oral   Oral  ?SpO2: 97% 97%  100%  ?Weight:      ?Height:      ? ? ?Intake/Output Summary (Last 24 hours) at 06/29/2021 1820 ?Last data filed at 06/29/2021 1500 ?Gross per 24 hour  ?Intake 508.51 ml  ?Output 505 ml  ?Net 3.51 ml  ? ?Filed Weights  ? 06/26/21 1603 06/27/21 0839  ?Weight: 107.5 kg 108.2 kg  ? ? ?Physical Exam ? ?Gen:- Awake Alert, cooperative in no acute distress ?HEENT:- [place].AT, No sclera icterus ?Neck-Supple Neck,No JVD,.  ?Lungs-  CTAB , fair symmetrical air movement ?CV- S1, S2 normal, regular  ?Abd-  +ve B.Sounds, Abd Soft, No tenderness, no CVA area tenderness ?Extremity/Skin:-  pedal pulses present  ?Psych-affect is appropriate, oriented x3 ?Neuro-generalized weakness, no new focal deficits, no tremors ? ?Data Reviewed: I have personally reviewed following labs and imaging studies ? ?CBC: ?Recent Labs  ?Lab 06/26/21 ?1615 06/27/21 ?1601 06/29/21 ?0932  ?WBC 17.0* 11.8* 9.0  ?NEUTROABS 13.7*  --   --   ?HGB 12.8 10.8* 12.1  ?HCT 38.8 33.7* 37.1  ?MCV 86.6 88.0 83.7  ?PLT 187 135* 164  ? ?Basic Metabolic Panel: ?Recent Labs  ?Lab 06/26/21 ?1615 06/27/21 ?3557 06/29/21 ?3220  ?NA 134* 137 138  ?K 4.2 4.0 3.6  ?CL 99 103 105  ?CO2 '25 26 22  '$ ?GLUCOSE 120* 139* 113*  ?BUN 24* 22 14  ?CREATININE 1.45* 1.30* 0.99  ?CALCIUM 9.8 9.2 9.6  ? ?GFR: ?Estimated Creatinine Clearance: 50.3 mL/min (by C-G formula based on SCr of 0.99 mg/dL). ?Liver Function Tests: ?Recent Labs  ?Lab 06/26/21 ?1615  ?AST 27   ?ALT 22  ?ALKPHOS 42  ?BILITOT 2.0*  ?PROT 8.5*  ?ALBUMIN 4.2  ? ?Cardiac Enzymes: ?No results for input(s): CKTOTAL, CKMB, CKMBINDEX, TROPONINI in the last 168 hours. ?BNP (last 3 results) ?No results for input(s): PROBNP in the last 8760 hours. ?HbA1C: ?No results for input(s): HGBA1C in the last 72 hours. ?Sepsis Labs: ?'@LABRCNTIP'$ (procalcitonin:4,lacticidven:4) ?) ?Recent Results (from the past 240 hour(s))  ?Blood Culture (routine x 2)     Status: None (Preliminary result)  ? Collection Time: 06/26/21  4:15 PM  ? Specimen: Left Antecubital; Blood  ?Result  Value Ref Range Status  ? Specimen Description LEFT ANTECUBITAL  Final  ? Special Requests   Final  ?  BOTTLES DRAWN AEROBIC AND ANAEROBIC Blood Culture adequate volume  ? Culture   Final  ?  NO GROWTH 3 DAYS ?Performed at [facility], [address] [place], [place] [address] ?  ? Report Status PENDING  Incomplete  ?Blood Culture (routine x 2)     Status: None (Preliminary result)  ? Collection Time: 06/26/21  4:15 PM  ? Specimen: BLOOD LEFT HAND  ?Result Value Ref Range Status  ? Specimen Description BLOOD LEFT HAND  Final  ? Special Requests   Final  ?  BOTTLES DRAWN AEROBIC AND ANAEROBIC Blood Culture results may not be optimal due to an inadequate volume of blood received in culture bottles  ? Culture   Final  ?  NO GROWTH 3 DAYS ?Performed at [facility], [address] [place], [place] [address] ?  ? Report Status PENDING  Incomplete  ?Urine Culture     Status: Abnormal  ? Collection Time: 06/26/21  4:40 PM  ? Specimen: In/Out Cath Urine  ?Result Value Ref Range Status  ? Specimen Description   Final  ?  IN/OUT CATH URINE ?Performed at [facility], [address], [place], [place] [address] ?  ? Special Requests   Final  ?  NONE ?Performed at [facility], [address] [place], [place] [address] ?  ? Culture (A)  Final  ?  >=100,000 COLONIES/mL ESCHERICHIA COLI ?Confirmed Extended Spectrum Beta-Lactamase Producer (ESBL).  In bloodstream infections  from ESBL organisms, carbapenems are preferred over piperacillin/tazobactam. They are shown to have a lower risk of mortality. ?  ? Report Status 06/29/2021 FINAL  Final  ? Organism ID, Bacteria ESCHERICHIA COLI

## 2021-06-30 ENCOUNTER — Other Ambulatory Visit (HOSPITAL_COMMUNITY): Payer: Self-pay

## 2021-06-30 DIAGNOSIS — A419 Sepsis, unspecified organism: Secondary | ICD-10-CM | POA: Diagnosis not present

## 2021-06-30 DIAGNOSIS — N390 Urinary tract infection, site not specified: Secondary | ICD-10-CM | POA: Diagnosis not present

## 2021-06-30 MED ORDER — SODIUM CHLORIDE 0.9 % IV SOLN
1.0000 g | Freq: Once | INTRAVENOUS | Status: AC
  Administered 2021-06-30: 1000 mg via INTRAVENOUS
  Filled 2021-06-30: qty 1

## 2021-06-30 MED ORDER — FOSFOMYCIN TROMETHAMINE 3 G PO PACK: 3.0000 g | PACK | Freq: Once | ORAL | 0 refills | Status: AC

## 2021-06-30 NOTE — Progress Notes (Signed)
IV has been removed, discharge instructions provided. Patient is dressed and waiting for ride.  ?

## 2021-06-30 NOTE — TOC Benefit Eligibility Note (Signed)
Patient Advocate Encounter ?  ?Received notification that prior authorization for Fosfomycin Tromethamine 3GM packets is required. ?  ?PA submitted on 06/30/2021 ?Key [id number] ?Status is pending ?   ? ? ? ? ?

## 2021-06-30 NOTE — Discharge Instructions (Signed)
1)Avoid ibuprofen/Advil/Aleve/Motrin/Goody Powders/Naproxen/BC powders/Meloxicam/Diclofenac/Indomethacin and other Nonsteroidal anti-inflammatory medications as these will make you more likely to bleed and can cause stomach ulcers, can also cause Kidney problems.  ? ?2)Take Fosfomycin Antibiotic 3 gm x 1 dose On Tuesday 07/01/2021 ? ?3)Repeat CBC and BMP Blood Tests in 1 week ?

## 2021-06-30 NOTE — Consult Note (Signed)
[facility] CM Inpatient Consult ? ? ?06/30/2021 ? ?[name] ?02-Apr-1940 ?[id number] ? ?[place] [facility] CM) ?  ?Patient chart reviewed with noted high risk score for unplanned readmission. Assessed for post hospital chronic care coordination needs with [facility] CM.  ? ?Primary care provider office offers embedded chronic care management services and team and is listed for TOC follow up and appointments. Per review, patient has family support at home and is for [facility]. ? ?Of note, [facility] Care Management services does not replace or interfere with any services that are arranged by inpatient case management or social work.  ? ?Netta Cedars, MSN, RN ?[facility] Liaison ?Toll free office [phone] ?

## 2021-06-30 NOTE — TOC Benefit Eligibility Note (Signed)
Patient Advocate Encounter ? ?Prior Authorization for Fosfomycin Tromethamine 3GM packets has been approved.   ? ?PA# [id number] ?Effective dates: 06/30/2021 through 02/22/2022 ? ?Patients co-pay is $0.  ? ? ? ? ?

## 2021-06-30 NOTE — TOC Benefit Eligibility Note (Signed)
Patient Advocate Encounter ?  ?Insurance verification completed.   ?  ?The patient is currently admitted and upon discharge could be taking fosfomycin 3 g. ?  ?non preferred. preferred drugs are TRIMETHOPRIM;LEVOFLOXACIN;SULFAMETHOXAZO ?LE/TRIMETHOPRIM;AMOXICILLIN/CLAVULANATE; ?CIPROFLOXACIN '250MG'$  PREF'D ? ?The patient is insured through [employer] part d. ? ? ?   ?

## 2021-06-30 NOTE — Discharge Summary (Signed)
?                                                                                ? ? ?Sheila [name], is a 81 y.o. female  DOB Apr 08, 1940  MRN [id number]. ? ?Admission date:  06/26/2021  Admitting Physician  Krista Godsil Denton Brick, MD ? ?Discharge Date:  06/30/2021  ? ?Primary MD  Sharion Balloon, FNP ? ?Recommendations for primary care physician for things to follow:  ? ?1)Avoid ibuprofen/Advil/Aleve/Motrin/Goody Powders/Naproxen/BC powders/Meloxicam/Diclofenac/Indomethacin and other Nonsteroidal anti-inflammatory medications as these will make you more likely to bleed and can cause stomach ulcers, can also cause Kidney problems.  ? ?2)Take Fosfomycin Antibiotic 3 gm x 1 dose On Tuesday 07/01/2021 ? ?3)Repeat CBC and BMP Blood Tests in 1 week ? ?Admission Diagnosis  Morbid obesity ([place]) [E66.01] ?Sepsis due to urinary tract infection ([place]) [A41.9, N39.0] ?Systemic inflammatory response syndrome (SIRS) (HCC) [R65.10] ?Sepsis ([place]) [A41.9] ?Urinary tract infection with hematuria, site unspecified [N39.0, R31.9] ?Chronic obstructive pulmonary disease, unspecified COPD type ([place]) [J44.9] ? ? ?Discharge Diagnosis  Morbid obesity ([place]) [E66.01] ?Sepsis due to urinary tract infection ([place]) [A41.9, N39.0] ?Systemic inflammatory response syndrome (SIRS) (HCC) [R65.10] ?Sepsis ([place]) [A41.9] ?Urinary tract infection with hematuria, site unspecified [N39.0, R31.9] ?Chronic obstructive pulmonary disease, unspecified COPD type ([place]) [J44.9]   ? ?Principal Problem: ?  Sepsis secondary to UTI [facility]) ?Active Problems: ?  PUD (peptic ulcer disease) ?  GAD (generalized anxiety disorder) ?  Essential hypertension, benign ?  Chronic kidney disease, stage 3 unspecified ([place]) ?  Chronic systolic CHF (congestive heart failure) ([place]) ?  Acute metabolic encephalopathy ?    ? ?Past Medical History:  ?Diagnosis Date  ? Allergy   ? Bell's palsy   ? Cancer [facility])   ? skin CA on face  ? Cataract   ? Chest pain 07/2009  ? Myoveiw stress test negative.  ?  Depression   ? Diverticulosis of colon   ? DJD (degenerative joint disease) of knee   ? Dyspnea   ? on exertion  ? Family history of adverse reaction to anesthesia   ? sister ha nausea/vomiting  ? Gastric ulcer with hemorrhage 01/14/2011  ? GERD (gastroesophageal reflux disease)   ? Headache   ? History of fractured pelvis   ? Hypercholesteremia   ? Hypertension   ? Hypothyroidism   ? LV dysfunction   ? UTI (lower urinary tract infection)   ? ? ?Past Surgical History:  ?Procedure Laterality Date  ? APPENDECTOMY    ? BREAST BIOPSY Left   ? ESOPHAGOGASTRODUODENOSCOPY  01/14/2011  ? Procedure: ESOPHAGOGASTRODUODENOSCOPY (EGD);  Surgeon: Rogene Houston, MD;  Location: [facility] ENDO SUITE;  Service: Endoscopy;  Laterality: N/A;  ? HIP SURGERY    ? pelvis  ? JOINT REPLACEMENT    ? KNEE SURGERY  04/2010.  ? Total left knee replacement  ? LAPAROSCOPIC APPENDECTOMY N/A 08/01/2013  ? Procedure: APPENDECTOMY LAPAROSCOPIC;  Surgeon: Jamesetta So, MD;  Location: [facility] ORS;  Service: General;  Laterality: N/A;  ? RIGHT/LEFT HEART CATH AND CORONARY ANGIOGRAPHY N/A 10/02/2019  ? Procedure: RIGHT/LEFT HEART CATH AND CORONARY ANGIOGRAPHY;  Surgeon: Lorretta Harp, MD;  Location: [facility]  CV LAB;  Service: Cardiovascular;  Laterality: N/A;  ? ? ? HPI  from the history and physical done on the day of admission:  ? ?  ?Chief Complaint: Weakness ?  ?HPI: Sheila [name] is a 81 y.o. female with medical history significant for COPD, nonischemic cardiomyopathy, congestive heart failure, hypertension, CKD 3. ?Patient presented to the ED with complaints of shaking and increasing weakness.  Patient's daughter is present at bedside and assists with the history.  At time of my evaluation patient is awake alert oriented and able to answer questions.  Patient's daughter reports that patient has been wheezy.  No change in cough or sputum purulence, no increasing difficulty breathing.  She also reports yesterday patient slept most of the day, and required  questions to be repeated before she could answer.  Patient denies any urinary symptoms at this time.  No vomiting no loose stools.  No abdominal pain. ?  ?ED Course: Febrile to 102.  Heart rate 80s to 90.  Respiratory rate 20-28.  Blood pressure systolic 703-500.  O2 sats greater than 96% on room air. ?WBC 17.  Lactic acid 1.6.  UA with large leukocytes many bacteria.  Chest x-ray clear. ?2 g IV ceftriaxone started.  1 L bolus given.  Hospitalist to admit for sepsis secondary to UTI. ?  ?Review of Systems: As per HPI all other systems reviewed and negative. ? ? ? ? Hospital Course:  ?Brief Narrative:  ?81 y.o. female with medical history significant for COPD, nonischemic cardiomyopathy, congestive heart failure, hypertension, CKD 3 admitted on 06/26/2021 with sepsis secondary to urinary ? ?Assessment and Plan: ? ?1) Sepsis secondary to ESBL E. coli UTI ---POA ?No further fevers, hemodynamically improved, mentation back to baseline ?-Urine culture from 06/26/2021 with ESBL E. Coli ?-Initially treated with IV Rocephin  ?-Switched over to iv ertapenem on 06/29/2021 and 06/30/2021 ?-Sepsis pathophysiology resolved ?-Reluctant to treat with Bactrim as patient is on Entresto with risk of AKI and hyperkalemia with this combo ?-Okay to discharge on fosfomycin to be taking on 07/01/2021 ?  ?2)Acute metabolic encephalopathy--related to #1 above ?-CT head without acute findings ?-Mentation is back to normal as per family members ?-Okay to restart gabapentin ?  ?3)Chronic systolic CHF (congestive heart failure) ([place]) ? not volume overloaded ? last echo 02/2021, EF of 45 to 50%. ?-PTA had not taken torsemide for close to 2 weeks ?Okay to restart PTA torsemide, Entresto, spironolactone, metoprolol ? ?  ?Chronic kidney disease, stage 3 unspecified ([place]) ?CKD stage III A-B.  ?baseline 1.1-1.5. ?-Creatinine is down to 0.99 from 1.45 on admission ?- renally adjust medications, avoid nephrotoxic agents / dehydration  / hypotension ?  ?   ?Essential hypertension, benign ?Stable. ?Continue Entresto, spironolactone, metoprolol ?  ?GAD (generalized anxiety disorder) ?Continue desvenlafaxine and as needed Xanax ?  ?H/O PUD (peptic ulcer disease) ?Hemoglobin stable. ?Continue Protonix ?  ?Disposition--discharge home with home health services ?  ?Disposition: The patient is from: Home ?             Anticipated d/c is to: Home with HH ?    ?Code Status :  -  Code Status: DNR  ?  ?Family Communication:  (patient is alert, awake and coherent)  ?-Called and updated daughter Hilda Blades at [phone] ? ?discharge Condition: Stable, ? ?Follow UP ? ? Follow-up Information   ? ? [facility] Follow up.   ?Why: PT will call to schedule your first home visit. ? ?  ?  ? ?  [name] A, FNP Follow up in 1 week(s).   ?Specialty: Family Medicine ?Why: CBC and BMP in 1 week ?Contact information: ?[address] ?[place] [address] ?[phone] ? ? ?  ?  ? ? Pixie Casino, MD .   ?Specialty: Cardiology ?Contact information: ?[address] ?[address] ?[place] [address] ?[phone] ? ? ?  ?  ? ?  ?  ? ?  ?  ?Diet and Activity recommendation:  As advised ? ?Discharge Instructions   ? ?Discharge Instructions   ? ? Call MD for:  difficulty breathing, headache or visual disturbances   Complete by: As directed ?  ? Call MD for:  persistant dizziness or light-headedness   Complete by: As directed ?  ? Call MD for:  persistant nausea and vomiting   Complete by: As directed ?  ? Call MD for:  temperature >100.4   Complete by: As directed ?  ? Diet - low sodium heart healthy   Complete by: As directed ?  ? Discharge instructions   Complete by: As directed ?  ? 1)Avoid ibuprofen/Advil/Aleve/Motrin/Goody Powders/Naproxen/BC powders/Meloxicam/Diclofenac/Indomethacin and other Nonsteroidal anti-inflammatory medications as these will make you more likely to bleed and can cause stomach ulcers, can also cause Kidney problems.  ? ?2)Take Fosfomycin Antibiotic  3 gm x 1 dose On Tuesday 07/01/2021 ? ?3)Repeat CBC and BMP Blood Tests in 1 week  ? Increase activity slowly   Complete by: As directed ?  ? ?  ? ? Discharge Medications  ? ?  ?Allergies as of 06/30/2021   ? ?   Reactions

## 2021-06-30 NOTE — TOC Transition Note (Addendum)
Transition of Care (TOC) - CM/SW Discharge Note ? ? ?Patient Details  ?Name: Sheila [name] ?MRN: [id number] ?Date of Birth: Dec 30, 1940 ? ?Transition of Care (TOC) CM/SW Contact:  ?Boneta Lucks, RN ?Phone Number: ?06/30/2021, 9:56 AM ? ? ?Clinical Narrative:   Discharging home today. [facility] accepted for home health PT and OT to address ADL's.  Orders are in and [facility] Roch updated. Added to AVS. ? ? ?Final next level of care: [place] ?Barriers to Discharge: Barriers Resolved ? ? ?Patient Goals and CMS Choice ?Patient states their goals for this hospitalization and ongoing recovery are:: to go home. ?CMS Medicare.gov Compare Post Acute Care list provided to:: Patient Represenative (must comment) ?Choice offered to / list presented to : Adult Children ? ?Discharge Placement ?   ?Patient and family notified of of transfer: 06/30/21 ? ?Discharge Plan and Services ?  ?    ?HH Arranged: PT ?  ?Date HH Agency Contacted: 06/30/21 ?Time [place]: [phone] ?Representative spoke with at [place]: [place] with Elliot Cousin ? ?Readmission Risk Interventions ? ?  06/27/2021  ? 12:48 PM  ?Readmission Risk Prevention Plan  ?Transportation Screening Complete  ?PCP or Specialist Appt within 3-5 Days Not Complete  ?[place] or Home Care Consult Complete  ?Social Work Consult for [place] Planning/Counseling Complete  ?Palliative Care Screening Not Applicable  ?Medication Review Press photographer) Complete  ? ? ? ? ? ?

## 2021-07-01 ENCOUNTER — Telehealth: Payer: Self-pay

## 2021-07-01 ENCOUNTER — Ambulatory Visit: Payer: Medicaid Other | Admitting: Cardiovascular Disease

## 2021-07-01 DIAGNOSIS — I5022 Chronic systolic (congestive) heart failure: Secondary | ICD-10-CM | POA: Diagnosis not present

## 2021-07-01 DIAGNOSIS — G9341 Metabolic encephalopathy: Secondary | ICD-10-CM | POA: Diagnosis not present

## 2021-07-01 DIAGNOSIS — N390 Urinary tract infection, site not specified: Secondary | ICD-10-CM | POA: Diagnosis not present

## 2021-07-01 DIAGNOSIS — Z1612 Extended spectrum beta lactamase (ESBL) resistance: Secondary | ICD-10-CM | POA: Diagnosis not present

## 2021-07-01 DIAGNOSIS — I130 Hypertensive heart and chronic kidney disease with heart failure and stage 1 through stage 4 chronic kidney disease, or unspecified chronic kidney disease: Secondary | ICD-10-CM | POA: Diagnosis not present

## 2021-07-01 LAB — CULTURE, BLOOD (ROUTINE X 2)
Culture: NO GROWTH
Culture: NO GROWTH
Special Requests: ADEQUATE

## 2021-07-01 NOTE — Telephone Encounter (Signed)
Transition Care Management Follow-up Telephone Call ?Date of discharge and from where: [facility] 06-30-21 Dx: sepsis d/t UTI ?How have you been since you were released from the hospital? Doing ok just tired  ?Any questions or concerns? No ? ?Items Reviewed: ?Did the pt receive and understand the discharge instructions provided? Yes  ?Medications obtained and verified? Yes  ?Other? No  ?Any new allergies since your discharge? No  ?Dietary orders reviewed? Yes ?Do you have support at home? Yes  ? ?Home Care and Equipment/Supplies: ?Were home health services ordered? yes ?If so, what is the name of the agency? [facility]   ?Has the agency set up a time to come to the patient's home? no ?Were any new equipment or medical supplies ordered?  No ?What is the name of the medical supply agency? na ?Were you able to get the supplies/equipment? not applicable ?Do you have any questions related to the use of the equipment or supplies? No ? ?Functional Questionnaire: (I = Independent and D = Dependent) ?ADLs: D ? ?Bathing/Dressing- D ? ?Meal Prep- D ? ?Eating- I ? ?Maintaining continence- I ? ?Transferring/Ambulation- Selena Lesser ? ?Managing Meds- D ? ?Follow up appointments reviewed: ? ?PCP Hospital f/u appt confirmed? Yes  Scheduled to see [name], FNP on 07-14-21@ 225PM. ?[facility] f/u appt confirmed? No . ?Are transportation arrangements needed? No  ?If their condition worsens, is the pt aware to call PCP or go to the Emergency Dept.? Yes ?Was the patient provided with contact information for the PCP's office or ED? Yes ?Was to pt encouraged to call back with questions or concerns? Yes  ?

## 2021-07-04 DIAGNOSIS — G9341 Metabolic encephalopathy: Secondary | ICD-10-CM | POA: Diagnosis not present

## 2021-07-04 DIAGNOSIS — N390 Urinary tract infection, site not specified: Secondary | ICD-10-CM | POA: Diagnosis not present

## 2021-07-04 DIAGNOSIS — I5022 Chronic systolic (congestive) heart failure: Secondary | ICD-10-CM | POA: Diagnosis not present

## 2021-07-04 DIAGNOSIS — Z1612 Extended spectrum beta lactamase (ESBL) resistance: Secondary | ICD-10-CM | POA: Diagnosis not present

## 2021-07-04 DIAGNOSIS — I130 Hypertensive heart and chronic kidney disease with heart failure and stage 1 through stage 4 chronic kidney disease, or unspecified chronic kidney disease: Secondary | ICD-10-CM | POA: Diagnosis not present

## 2021-07-08 ENCOUNTER — Other Ambulatory Visit: Payer: Self-pay | Admitting: Family

## 2021-07-08 DIAGNOSIS — I5022 Chronic systolic (congestive) heart failure: Secondary | ICD-10-CM | POA: Diagnosis not present

## 2021-07-08 DIAGNOSIS — G9341 Metabolic encephalopathy: Secondary | ICD-10-CM | POA: Diagnosis not present

## 2021-07-08 DIAGNOSIS — N390 Urinary tract infection, site not specified: Secondary | ICD-10-CM | POA: Diagnosis not present

## 2021-07-08 DIAGNOSIS — I130 Hypertensive heart and chronic kidney disease with heart failure and stage 1 through stage 4 chronic kidney disease, or unspecified chronic kidney disease: Secondary | ICD-10-CM | POA: Diagnosis not present

## 2021-07-08 DIAGNOSIS — Z1612 Extended spectrum beta lactamase (ESBL) resistance: Secondary | ICD-10-CM | POA: Diagnosis not present

## 2021-07-09 DIAGNOSIS — Z1612 Extended spectrum beta lactamase (ESBL) resistance: Secondary | ICD-10-CM | POA: Diagnosis not present

## 2021-07-09 DIAGNOSIS — N390 Urinary tract infection, site not specified: Secondary | ICD-10-CM | POA: Diagnosis not present

## 2021-07-09 DIAGNOSIS — G9341 Metabolic encephalopathy: Secondary | ICD-10-CM | POA: Diagnosis not present

## 2021-07-09 DIAGNOSIS — I5022 Chronic systolic (congestive) heart failure: Secondary | ICD-10-CM | POA: Diagnosis not present

## 2021-07-09 DIAGNOSIS — I130 Hypertensive heart and chronic kidney disease with heart failure and stage 1 through stage 4 chronic kidney disease, or unspecified chronic kidney disease: Secondary | ICD-10-CM | POA: Diagnosis not present

## 2021-07-10 DIAGNOSIS — I130 Hypertensive heart and chronic kidney disease with heart failure and stage 1 through stage 4 chronic kidney disease, or unspecified chronic kidney disease: Secondary | ICD-10-CM | POA: Diagnosis not present

## 2021-07-10 DIAGNOSIS — Z1612 Extended spectrum beta lactamase (ESBL) resistance: Secondary | ICD-10-CM | POA: Diagnosis not present

## 2021-07-10 DIAGNOSIS — G9341 Metabolic encephalopathy: Secondary | ICD-10-CM | POA: Diagnosis not present

## 2021-07-10 DIAGNOSIS — I5022 Chronic systolic (congestive) heart failure: Secondary | ICD-10-CM | POA: Diagnosis not present

## 2021-07-10 DIAGNOSIS — N390 Urinary tract infection, site not specified: Secondary | ICD-10-CM | POA: Diagnosis not present

## 2021-07-14 ENCOUNTER — Encounter: Payer: Self-pay | Admitting: Family

## 2021-07-14 ENCOUNTER — Ambulatory Visit (INDEPENDENT_AMBULATORY_CARE_PROVIDER_SITE_OTHER): Payer: Medicare Other | Admitting: Family

## 2021-07-14 VITALS — BP 123/66 | HR 81 | Temp 98.5°F | Ht 59.0 in | Wt 233.8 lb

## 2021-07-14 DIAGNOSIS — N390 Urinary tract infection, site not specified: Secondary | ICD-10-CM

## 2021-07-14 DIAGNOSIS — Z09 Encounter for follow-up examination after completed treatment for conditions other than malignant neoplasm: Secondary | ICD-10-CM

## 2021-07-14 DIAGNOSIS — A419 Sepsis, unspecified organism: Secondary | ICD-10-CM

## 2021-07-14 DIAGNOSIS — N1832 Chronic kidney disease, stage 3b: Secondary | ICD-10-CM | POA: Diagnosis not present

## 2021-07-14 DIAGNOSIS — I5022 Chronic systolic (congestive) heart failure: Secondary | ICD-10-CM | POA: Diagnosis not present

## 2021-07-14 LAB — URINALYSIS, COMPLETE
Bilirubin, UA: NEGATIVE
Glucose, UA: NEGATIVE
Ketones, UA: NEGATIVE
Nitrite, UA: NEGATIVE
Protein,UA: NEGATIVE
RBC, UA: NEGATIVE
Specific Gravity, UA: 1.01 (ref 1.005–1.030)
Urobilinogen, Ur: 0.2 mg/dL (ref 0.2–1.0)
pH, UA: 6.5 (ref 5.0–7.5)

## 2021-07-14 LAB — MICROSCOPIC EXAMINATION: RBC, Urine: NONE SEEN /hpf (ref 0–2)

## 2021-07-14 NOTE — Progress Notes (Signed)
Subjective:    Patient ID: Sheila [name], female    DOB: 10-02-1940, 81 y.o.   MRN: [id number]  Chief Complaint  Patient presents with   Hospitalization Follow-up    Systemic inflammatory response syndrome (SIRS) (HCC)  Urinary tract infection with hematuria, site unspecified  Chronic obstructive pulmonary disease, unspecified COPD type ([place])  Sepsis due to urinary tract infection ([place])  Morbid obesity ([place]      Today's visit was for Transitional Care Management.  The patient was discharged from [facility] on 06/30/21 with a primary diagnosis of sepsis r/t UTI.   Contact with the patient and/or caregiver, by a clinical staff member, was made on 07/01/21 and was documented as a telephone encounter within the EMR.  Through chart review and discussion with the patient I have determined that management of their condition is of high complexity.    PT was discharged on fosfomycin. She completed this. Denies any dysuria or frequency. States she can only "pee a little at a time".   She has [facility] doing PT and OT.   Congestive Heart Failure Presents for follow-up visit. Associated symptoms include fatigue and shortness of breath (when walking). Pertinent negatives include no edema. The symptoms have been stable.  Hypertension This is a chronic problem. The current episode started more than 1 year ago. The problem has been resolved since onset. The problem is controlled. Associated symptoms include malaise/fatigue and shortness of breath (when walking). Pertinent negatives include no peripheral edema. Risk factors for coronary artery disease include dyslipidemia, diabetes mellitus, obesity and sedentary lifestyle. The current treatment provides moderate improvement. Hypertensive end-organ damage includes heart failure.     Review of Systems  Constitutional:  Positive for fatigue and malaise/fatigue.  Respiratory:  Positive for shortness of breath (when walking).   All other systems reviewed  and are negative.     Objective:   Physical Exam Vitals reviewed.  Constitutional:      General: She is not in acute distress.    Appearance: She is well-developed. She is obese.  HENT:     Head: Normocephalic and atraumatic.     Right Ear: Tympanic membrane normal.     Left Ear: Tympanic membrane normal.  Eyes:     Pupils: Pupils are equal, round, and reactive to light.  Neck:     Thyroid: No thyromegaly.  Cardiovascular:     Rate and Rhythm: Normal rate and regular rhythm.     Heart sounds: Normal heart sounds. No murmur heard. Pulmonary:     Effort: Pulmonary effort is normal. No respiratory distress.     Breath sounds: Normal breath sounds. No wheezing.  Abdominal:     General: Bowel sounds are normal. There is no distension.     Palpations: Abdomen is soft.     Tenderness: There is no abdominal tenderness.  Musculoskeletal:        General: No tenderness. Normal range of motion.     Cervical back: Normal range of motion and neck supple.  Skin:    General: Skin is warm and dry.  Neurological:     Mental Status: She is alert and oriented to person, place, and time.     Cranial Nerves: No cranial nerve deficit.     Motor: Weakness (generalized weakness) present.     Gait: Gait abnormal.     Deep Tendon Reflexes: Reflexes are normal and symmetric.  Psychiatric:        Behavior: Behavior normal.  Thought Content: Thought content normal.        Judgment: Judgment normal.      BP 123/66   Pulse 81   Temp 98.5 F (36.9 C)   Ht 4' 11" (1.499 m)   Wt 233 lb 12.8 oz (106.1 kg)   SpO2 94%   BMI 47.22 kg/m      Assessment & Plan:  Sheila [name] comes in today with chief complaint of Hospitalization Follow-up (Systemic inflammatory response syndrome (SIRS) (HCC) /Urinary tract infection with hematuria, site unspecified /Chronic obstructive pulmonary disease, unspecified COPD type ([place]) /Sepsis due to urinary tract infection ([place]) /Morbid obesity  ([place] //)   Diagnosis and orders addressed:  1. Chronic systolic CHF (congestive heart failure) (HCC) - CMP14+EGFR - CBC with Differential/Platelet  2. Stage 3b chronic kidney disease ([place]) - CMP14+EGFR - CBC with Differential/Platelet  3. Sepsis secondary to UTI ([place]) - CMP14+EGFR - CBC with Differential/Platelet - Urinalysis, Complete - Urine Culture  4. Morbid obesity ([place]) - CMP14+EGFR - CBC with Differential/Platelet  5. Hospital discharge follow-up - CMP14+EGFR - CBC with Differential/Platelet   Labs pending Health Maintenance reviewed Diet and exercise encouraged  Follow up plan: Keep chronic follow up   [name], FNP

## 2021-07-14 NOTE — Patient Instructions (Signed)
Chronic Kidney Disease, Adult Chronic kidney disease (CKD) occurs when the kidneys are slowly and permanently damaged over a long period of time. The kidneys are a pair of organs that do many important jobs in the body, including: Removing waste and extra fluid from the blood to make urine. Making hormones that maintain the amount of fluid in tissues and blood vessels. Maintaining the right amount of fluids and chemicals in the body. A small amount of kidney damage may not cause problems, but a large amount of damage may make it hard or impossible for the kidneys to work right. Steps must be taken to slow kidney damage or to stop it from getting worse. If steps are not taken, the kidneys may stop working permanently (end-stage renal disease, or ESRD). Most of the time, CKD does not go away, but it can often be controlled. People who have CKD are usually able to live full lives. What are the causes? The most common causes of this condition are diabetes and high blood pressure (hypertension). Other causes include: Cardiovascular diseases. These affect the heart and blood vessels. Kidney diseases. These include: Glomerulonephritis, or inflammation of the tiny filters in the kidneys. Interstitial nephritis. This is swelling of the small tubes of the kidneys and of the surrounding structures. Polycystic kidney disease, in which clusters of fluid-filled sacs form within the kidneys. Renal vascular disease. This includes disorders that affect the arteries and veins of the kidneys. Diseases that affect the body's defense system (immune system). A problem with urine flow. This may be caused by: Kidney stones. Cancer. An enlarged prostate, in males. A kidney infection or urinary tract infection (UTI) that keeps coming back. Vasculitis. This is swelling or inflammation of the blood vessels. What increases the risk? Your chances of having kidney disease increase with age. The following factors may make  you more likely to develop this condition: A family history of kidney disease or kidney failure. Kidney failure means the kidneys can no longer work right. Certain genetic diseases. Taking medicines often that are damaging to the kidneys. Being around or being in contact with toxic substances. Obesity. A history of tobacco use. What are the signs or symptoms? Symptoms of this condition include: Feeling very tired (lethargic) and having less energy. Swelling, or edema, of the face, legs, ankles, or feet. Nausea or vomiting, or loss of appetite. Confusion or trouble concentrating. Muscle twitches and cramps, especially in the legs. Dry, itchy skin. A metallic taste in the mouth. Producing less urine, or producing more urine (especially at night). Shortness of breath. Trouble sleeping. CKD may also result in not having enough red blood cells or hemoglobin in the blood (anemia) or having weak bones (bone disease). Symptoms develop slowly and may not be obvious until the kidney damage becomes severe. It is possible to have kidney disease for years without having symptoms. How is this diagnosed? This condition may be diagnosed based on: Blood tests. Urine tests. Imaging tests, such as an ultrasound or a CT scan. A kidney biopsy. This involves removing a sample of kidney tissue to be looked at under a microscope. Results from these tests will help to determine how serious the CKD is. How is this treated? There is no cure for most cases of this condition, but treatment usually relieves symptoms and prevents or slows the worsening of the disease. Treatment may include: Diet changes, which may require you to avoid alcohol and foods that are high in salt, potassium, phosphorous, and protein. Medicines. These may:   Lower blood pressure. Control blood sugar (glucose). Relieve anemia. Relieve swelling. Protect your bones. Improve the balance of salts and minerals in your blood  (electrolytes). Dialysis, which is a type of treatment that removes toxic waste from the body. It may be needed if you have kidney failure. Managing any other conditions that are causing your CKD or making it worse. Follow these instructions at home: Medicines Take over-the-counter and prescription medicines only as told by your health care provider. The amount of some medicines that you take may need to be changed. Do not take any new medicines unless approved by your health care provider. Many medicines can make kidney damage worse. Do not take any vitamin and mineral supplements unless approved by your health care provider. Many nutritional supplements can make kidney damage worse. Lifestyle  Do not use any products that contain nicotine or tobacco, such as cigarettes, e-cigarettes, and chewing tobacco. If you need help quitting, ask your health care provider. If you drink alcohol: Limit how much you use to: 0-1 drink a day for women who are not pregnant. 0-2 drinks a day for men. Know how much alcohol is in your drink. In the [place] one drink equals one 12 oz bottle of beer (355 mL), one 5 oz glass of wine (148 mL), or one 1 oz glass of hard liquor (44 mL). Maintain a healthy weight. If you need help, ask your health care provider. General instructions  Follow instructions from your health care provider about eating or drinking restrictions, including any prescribed diet. Track your blood pressure at home. Report changes in your blood pressure as told. If you are being treated for diabetes, track your blood glucose levels as told. Start or continue an exercise plan. Exercise at least 30 minutes a day, 5 days a week. Keep your immunizations up to date as told. Keep all follow-up visits. This is important. Where to find more information American Association of Kidney Patients: www.aakp.org National Kidney Foundation: www.kidney.org American Kidney Fund: www.akfinc.org Life Options:  www.lifeoptions.org Kidney School: www.kidneyschool.org Contact a health care provider if: Your symptoms get worse. You develop new symptoms. Get help right away if: You develop symptoms of ESRD. These include: Headaches. Numbness in your hands or feet. Easy bruising. Frequent hiccups. Chest pain. Shortness of breath. Lack of menstrual periods, in women. You have a fever. You are producing less urine than usual. You have pain or bleeding when you urinate or when you have a bowel movement. These symptoms may represent a serious problem that is an emergency. Do not wait to see if the symptoms will go away. Get medical help right away. Call your local emergency services (911 in the [place] Do not drive yourself to the hospital. Summary Chronic kidney disease (CKD) occurs when the kidneys become damaged slowly over a long period of time. The most common causes of this condition are diabetes and high blood pressure (hypertension). There is no cure for most cases of CKD, but treatment usually relieves symptoms and prevents or slows the worsening of the disease. Treatment may include a combination of lifestyle changes, medicines, and dialysis. This information is not intended to replace advice given to you by your health care provider. Make sure you discuss any questions you have with your health care provider. Document Revised: 05/17/2019 Document Reviewed: 05/17/2019 Elsevier Patient Education  2023 [employer] Inc.  

## 2021-07-15 ENCOUNTER — Ambulatory Visit: Payer: Medicare Other | Admitting: Family

## 2021-07-15 DIAGNOSIS — Z1612 Extended spectrum beta lactamase (ESBL) resistance: Secondary | ICD-10-CM | POA: Diagnosis not present

## 2021-07-15 DIAGNOSIS — G9341 Metabolic encephalopathy: Secondary | ICD-10-CM | POA: Diagnosis not present

## 2021-07-15 DIAGNOSIS — I5022 Chronic systolic (congestive) heart failure: Secondary | ICD-10-CM | POA: Diagnosis not present

## 2021-07-15 DIAGNOSIS — N390 Urinary tract infection, site not specified: Secondary | ICD-10-CM | POA: Diagnosis not present

## 2021-07-15 DIAGNOSIS — I130 Hypertensive heart and chronic kidney disease with heart failure and stage 1 through stage 4 chronic kidney disease, or unspecified chronic kidney disease: Secondary | ICD-10-CM | POA: Diagnosis not present

## 2021-07-15 LAB — CMP14+EGFR
ALT: 32 IU/L (ref 0–32)
AST: 43 IU/L — ABNORMAL HIGH (ref 0–40)
Albumin/Globulin Ratio: 1.5 (ref 1.2–2.2)
Albumin: 4.7 g/dL (ref 3.7–4.7)
Alkaline Phosphatase: 57 IU/L (ref 44–121)
BUN/Creatinine Ratio: 19 (ref 12–28)
BUN: 23 mg/dL (ref 8–27)
Bilirubin Total: 0.8 mg/dL (ref 0.0–1.2)
CO2: 18 mmol/L — ABNORMAL LOW (ref 20–29)
Calcium: 9.7 mg/dL (ref 8.7–10.3)
Chloride: 98 mmol/L (ref 96–106)
Creatinine, Ser: 1.24 mg/dL — ABNORMAL HIGH (ref 0.57–1.00)
Globulin, Total: 3.2 g/dL (ref 1.5–4.5)
Glucose: 115 mg/dL — ABNORMAL HIGH (ref 70–99)
Potassium: 4.9 mmol/L (ref 3.5–5.2)
Sodium: 138 mmol/L (ref 134–144)
Total Protein: 7.9 g/dL (ref 6.0–8.5)
eGFR: 44 mL/min/{1.73_m2} — ABNORMAL LOW (ref >59)

## 2021-07-15 LAB — CBC WITH DIFFERENTIAL/PLATELET
Basophils Absolute: 0.1 10*3/uL (ref 0.0–0.2)
Basos: 1 %
EOS (ABSOLUTE): 0.3 10*3/uL (ref 0.0–0.4)
Eos: 3 %
Hematocrit: 42.4 % (ref 34.0–46.6)
Hemoglobin: 13.8 g/dL (ref 11.1–15.9)
Immature Grans (Abs): 0 10*3/uL (ref 0.0–0.1)
Immature Granulocytes: 0 %
Lymphocytes Absolute: 4.2 10*3/uL — ABNORMAL HIGH (ref 0.7–3.1)
Lymphs: 34 %
MCH: 28.1 pg (ref 26.6–33.0)
MCHC: 32.5 g/dL (ref 31.5–35.7)
MCV: 86 fL (ref 79–97)
Monocytes Absolute: 0.8 10*3/uL (ref 0.1–0.9)
Monocytes: 7 %
Neutrophils Absolute: 6.8 10*3/uL (ref 1.4–7.0)
Neutrophils: 55 %
Platelets: 387 10*3/uL (ref 150–450)
RBC: 4.91 x10E6/uL (ref 3.77–5.28)
RDW: 13.6 % (ref 11.7–15.4)
WBC: 12.3 10*3/uL — ABNORMAL HIGH (ref 3.4–10.8)

## 2021-07-16 DIAGNOSIS — I130 Hypertensive heart and chronic kidney disease with heart failure and stage 1 through stage 4 chronic kidney disease, or unspecified chronic kidney disease: Secondary | ICD-10-CM | POA: Diagnosis not present

## 2021-07-16 DIAGNOSIS — G9341 Metabolic encephalopathy: Secondary | ICD-10-CM | POA: Diagnosis not present

## 2021-07-16 DIAGNOSIS — I5022 Chronic systolic (congestive) heart failure: Secondary | ICD-10-CM | POA: Diagnosis not present

## 2021-07-16 DIAGNOSIS — N390 Urinary tract infection, site not specified: Secondary | ICD-10-CM | POA: Diagnosis not present

## 2021-07-16 DIAGNOSIS — Z1612 Extended spectrum beta lactamase (ESBL) resistance: Secondary | ICD-10-CM | POA: Diagnosis not present

## 2021-07-16 LAB — URINE CULTURE

## 2021-07-23 DIAGNOSIS — G9341 Metabolic encephalopathy: Secondary | ICD-10-CM | POA: Diagnosis not present

## 2021-07-23 DIAGNOSIS — I130 Hypertensive heart and chronic kidney disease with heart failure and stage 1 through stage 4 chronic kidney disease, or unspecified chronic kidney disease: Secondary | ICD-10-CM | POA: Diagnosis not present

## 2021-07-23 DIAGNOSIS — N390 Urinary tract infection, site not specified: Secondary | ICD-10-CM | POA: Diagnosis not present

## 2021-07-23 DIAGNOSIS — I5022 Chronic systolic (congestive) heart failure: Secondary | ICD-10-CM | POA: Diagnosis not present

## 2021-07-23 DIAGNOSIS — Z1612 Extended spectrum beta lactamase (ESBL) resistance: Secondary | ICD-10-CM | POA: Diagnosis not present

## 2021-07-24 ENCOUNTER — Ambulatory Visit (INDEPENDENT_AMBULATORY_CARE_PROVIDER_SITE_OTHER): Payer: Medicare Other

## 2021-07-24 DIAGNOSIS — M1711 Unilateral primary osteoarthritis, right knee: Secondary | ICD-10-CM

## 2021-07-24 DIAGNOSIS — G9341 Metabolic encephalopathy: Secondary | ICD-10-CM | POA: Diagnosis not present

## 2021-07-24 DIAGNOSIS — M19011 Primary osteoarthritis, right shoulder: Secondary | ICD-10-CM

## 2021-07-24 DIAGNOSIS — N390 Urinary tract infection, site not specified: Secondary | ICD-10-CM | POA: Diagnosis not present

## 2021-07-24 DIAGNOSIS — F32A Depression, unspecified: Secondary | ICD-10-CM

## 2021-07-24 DIAGNOSIS — I5022 Chronic systolic (congestive) heart failure: Secondary | ICD-10-CM

## 2021-07-24 DIAGNOSIS — N183 Chronic kidney disease, stage 3 unspecified: Secondary | ICD-10-CM | POA: Diagnosis not present

## 2021-07-24 DIAGNOSIS — G629 Polyneuropathy, unspecified: Secondary | ICD-10-CM

## 2021-07-24 DIAGNOSIS — E039 Hypothyroidism, unspecified: Secondary | ICD-10-CM

## 2021-07-24 DIAGNOSIS — Z1612 Extended spectrum beta lactamase (ESBL) resistance: Secondary | ICD-10-CM | POA: Diagnosis not present

## 2021-07-24 DIAGNOSIS — F1320 Sedative, hypnotic or anxiolytic dependence, uncomplicated: Secondary | ICD-10-CM

## 2021-07-24 DIAGNOSIS — R32 Unspecified urinary incontinence: Secondary | ICD-10-CM

## 2021-07-24 DIAGNOSIS — F17220 Nicotine dependence, chewing tobacco, uncomplicated: Secondary | ICD-10-CM

## 2021-07-24 DIAGNOSIS — J449 Chronic obstructive pulmonary disease, unspecified: Secondary | ICD-10-CM | POA: Diagnosis not present

## 2021-07-24 DIAGNOSIS — M4316 Spondylolisthesis, lumbar region: Secondary | ICD-10-CM

## 2021-07-24 DIAGNOSIS — G319 Degenerative disease of nervous system, unspecified: Secondary | ICD-10-CM

## 2021-07-24 DIAGNOSIS — I130 Hypertensive heart and chronic kidney disease with heart failure and stage 1 through stage 4 chronic kidney disease, or unspecified chronic kidney disease: Secondary | ICD-10-CM

## 2021-07-24 DIAGNOSIS — B9620 Unspecified Escherichia coli [E. coli] as the cause of diseases classified elsewhere: Secondary | ICD-10-CM

## 2021-07-24 DIAGNOSIS — I428 Other cardiomyopathies: Secondary | ICD-10-CM | POA: Diagnosis not present

## 2021-07-24 DIAGNOSIS — F411 Generalized anxiety disorder: Secondary | ICD-10-CM

## 2021-07-25 ENCOUNTER — Ambulatory Visit: Payer: Medicaid Other | Admitting: Cardiovascular Disease

## 2021-07-28 ENCOUNTER — Other Ambulatory Visit: Payer: Self-pay | Admitting: Family

## 2021-07-28 DIAGNOSIS — Z1612 Extended spectrum beta lactamase (ESBL) resistance: Secondary | ICD-10-CM | POA: Diagnosis not present

## 2021-07-28 DIAGNOSIS — L282 Other prurigo: Secondary | ICD-10-CM

## 2021-07-28 DIAGNOSIS — I130 Hypertensive heart and chronic kidney disease with heart failure and stage 1 through stage 4 chronic kidney disease, or unspecified chronic kidney disease: Secondary | ICD-10-CM | POA: Diagnosis not present

## 2021-07-28 DIAGNOSIS — I5022 Chronic systolic (congestive) heart failure: Secondary | ICD-10-CM | POA: Diagnosis not present

## 2021-07-28 DIAGNOSIS — N390 Urinary tract infection, site not specified: Secondary | ICD-10-CM | POA: Diagnosis not present

## 2021-07-28 DIAGNOSIS — G9341 Metabolic encephalopathy: Secondary | ICD-10-CM | POA: Diagnosis not present

## 2021-07-29 ENCOUNTER — Ambulatory Visit (INDEPENDENT_AMBULATORY_CARE_PROVIDER_SITE_OTHER): Payer: Medicare Other | Admitting: Family Medicine

## 2021-07-29 ENCOUNTER — Encounter: Payer: Self-pay | Admitting: Family Medicine

## 2021-07-29 VITALS — BP 130/77 | HR 79 | Temp 98.0°F | Ht 59.0 in | Wt 231.0 lb

## 2021-07-29 DIAGNOSIS — B372 Candidiasis of skin and nail: Secondary | ICD-10-CM

## 2021-07-29 MED ORDER — NYSTATIN-TRIAMCINOLONE 100000-0.1 UNIT/GM-% EX OINT: 1.0000 "application " | TOPICAL_OINTMENT | Freq: Two times a day (BID) | CUTANEOUS | 0 refills | Status: DC

## 2021-07-29 NOTE — Progress Notes (Signed)
Subjective:  Patient ID: Sheila [name], female    DOB: 15-Jun-1940, 81 y.o.   MRN: [id number]  Patient Care Team: Sharion Balloon, FNP as PCP - General (Family Medicine) Debara Pickett Nadean Corwin, MD as PCP - Cardiology (Cardiology) Rogene Houston, MD as Consulting Physician (Gastroenterology) Burnell Blanks, MD as Consulting Physician (Cardiology) Ilean China, RN as Registered Nurse Shea Evans, Norva Riffle, LCSW as Social Worker (Licensed Clinical Social Worker)   Chief Complaint:  Rash (Stomach/Left arm pit)   HPI: Sheila [name] is a 81 y.o. female presenting on 07/29/2021 for Rash (Stomach/Left arm pit)   Rash This is a new problem. The current episode started 1 to 4 weeks ago. The problem has been gradually worsening since onset. The affected locations include the left axilla and abdomen (breast). The rash is characterized by itchiness, redness and scaling. She was exposed to nothing. Pertinent negatives include no anorexia, congestion, cough, diarrhea, eye pain, facial edema, fatigue, fever, joint pain, nail changes, rhinorrhea, shortness of breath, sore throat or vomiting. Treatments tried: medicated powders. The treatment provided no relief.    Relevant past medical, surgical, family, and social history reviewed and updated as indicated.  Allergies and medications reviewed and updated. Data reviewed: Chart in Epic.   Past Medical History:  Diagnosis Date   Allergy    Bell's palsy    Cancer ([place])    skin CA on face   Cataract    Chest pain 07/2009   Myoveiw stress test negative.   Depression    Diverticulosis of colon    DJD (degenerative joint disease) of knee    Dyspnea    on exertion   Family history of adverse reaction to anesthesia    sister ha nausea/vomiting   Gastric ulcer with hemorrhage 01/14/2011   GERD (gastroesophageal reflux disease)    Headache    History of fractured pelvis    Hypercholesteremia    Hypertension    Hypothyroidism    LV  dysfunction    UTI (lower urinary tract infection)     Past Surgical History:  Procedure Laterality Date   APPENDECTOMY     BREAST BIOPSY Left    ESOPHAGOGASTRODUODENOSCOPY  01/14/2011   Procedure: ESOPHAGOGASTRODUODENOSCOPY (EGD);  Surgeon: Rogene Houston, MD;  [facility];  Service: Endoscopy;  Laterality: N/A;   HIP SURGERY     pelvis   JOINT REPLACEMENT     KNEE SURGERY  04/2010.   Total left knee replacement   LAPAROSCOPIC APPENDECTOMY N/A 08/01/2013   Procedure: APPENDECTOMY LAPAROSCOPIC;  Surgeon: Jamesetta So, MD;  Location: [facility];  Service: General;  Laterality: N/A;   RIGHT/LEFT HEART CATH AND CORONARY ANGIOGRAPHY N/A 10/02/2019   Procedure: RIGHT/LEFT HEART CATH AND CORONARY ANGIOGRAPHY;  Surgeon: Lorretta Harp, MD;  Location: [facility];  Service: Cardiovascular;  Laterality: N/A;    Social History   Socioeconomic History   Marital status: Widowed    Spouse name: Not on file   Number of children: 2   Years of education: 0   Highest education level: Never attended school  Occupational History   Occupation: retired  Tobacco Use   Smoking status: Former    Types: Cigarettes    Quit date: 02/24/1968    Years since quitting: 53.4   Smokeless tobacco: Current    Types: Snuff   Tobacco comments:    quit yrs and yrs ago when she was very young  Media planner  Vaping Use: Never used  Substance and Sexual Activity   Alcohol use: No   Drug use: No   Sexual activity: Not Currently    Birth control/protection: Post-menopausal  Other Topics Concern   Not on file  Social History Narrative   Lives alone, but has 24 hour caregiver, palliative care   Daughter lives nearby and helps her with paying bills, shopping, etc.   Social Determinants of Health   Financial Resource Strain: Low Risk    Difficulty of Paying Living Expenses: Not very hard  Food Insecurity: No Food Insecurity   Worried About [name] in the Last Year: Never true    [place] in the Last Year: Never true  Transportation Needs: No Transportation Needs   Lack of Transportation (Medical): No   Lack of Transportation (Non-Medical): No  Physical Activity: Inactive   Days of Exercise per Week: 0 days   Minutes of Exercise per Session: 0 min  Stress: No Stress Concern Present   Feeling of Stress : Only a little  Social Connections: Socially Isolated   Frequency of Communication with Friends and Family: More than three times a week   Frequency of Social Gatherings with Friends and Family: More than three times a week   Attends Religious Services: Never   Marine scientist or Organizations: No   Attends Archivist Meetings: Never   Marital Status: Widowed  Intimate Partner Violence: Not At Risk   Fear of Current or Ex-Partner: No   Emotionally Abused: No   Physically Abused: No   Sexually Abused: No    Outpatient Encounter Medications as of 07/29/2021  Medication Sig   albuterol (PROVENTIL) (2.5 MG/3ML) 0.083% nebulizer solution NEBULIZE 1 VIAL EVERY 6 TO 8 HOURS AS NEEDED FOR WHEEZING OR SHORTNESS OF BREATH   ALPRAZolam (XANAX) 0.5 MG tablet Take 0.5 mg by mouth 3 (three) times daily as needed.   Budeson-Glycopyrrol-Formoterol (BREZTRI AEROSPHERE) 160-9-4.8 MCG/ACT AERO Inhale 2 puffs into the lungs 2 (two) times daily.   Cholecalciferol (VITAMIN D3) 25 MCG (1000 UT) CAPS Take 1,000 Units by mouth daily.    cinacalcet (SENSIPAR) 30 MG tablet Take 1 tablet (30 mg total) by mouth every other day.   CRANBERRY PO Take 1 tablet by mouth daily.   desvenlafaxine (PRISTIQ) 100 MG 24 hr tablet Take 100 mg by mouth daily.   ENTRESTO 49-51 MG TAKE 1 TABLET 2 TIMES A DAY   gabapentin (NEURONTIN) 300 MG capsule TAKE 1 CAPSULE 3 TIMES A DAY (Patient taking differently: Take 300 mg by mouth 3 (three) times daily.)   hydrOXYzine (VISTARIL) 25 MG capsule TAKE ONE CAPSULE THREE TIMES DAILY AS NEEDED   levocetirizine (XYZAL) 5 MG tablet TAKE 1 TABLET  ONCE DAILY IN THE EVENING   levothyroxine (SYNTHROID) 88 MCG tablet TAKE 1 TABLET DAILY   LIVALO 4 MG TABS TAKE 1 TABLET DAILY   metoprolol tartrate (LOPRESSOR) 25 MG tablet Take 0.5 tablets (12.5 mg total) by mouth 2 (two) times daily.   nystatin-triamcinolone ointment (MYCOLOG) Apply 1 application. topically 2 (two) times daily.   pantoprazole (PROTONIX) 40 MG tablet Take 1 tablet (40 mg total) by mouth daily.   PROLIA 60 MG/ML SOSY injection Inject into the skin once.   sertraline (ZOLOFT) 100 MG tablet Take 1 tablet (100 mg total) by mouth daily. (Patient taking differently: Take 50 mg by mouth 2 (two) times daily.)   spironolactone (ALDACTONE) 25 MG tablet TAKE 1 TABLET ONCE DAILY  Tetrahydrozoline HCl (VISINE OP) Place 1 drop into both eyes daily as needed (dry eyes/itching).   torsemide (DEMADEX) 20 MG tablet Take 1 tablet (20 mg total) by mouth 2 (two) times daily. TAKE 2 TABLETS DAILY AS DIRECTED Strength: 20 mg   traMADol (ULTRAM) 50 MG tablet Take 2 tablets (100 mg total) by mouth every 12 (twelve) hours as needed. TAKE TWO TABLETS EVERY TWELVE HOURS AS NEEDED FOR PAIN   No facility-administered encounter medications on file as of 07/29/2021.    Allergies  Allergen Reactions   Ibuprofen Other (See Comments)    History of bleeding ulcers - contra-indicated    Benadryl [Diphenhydramine Hcl] Other (See Comments)    Worsening depression    Lipitor [Atorvastatin] Other (See Comments)    Body aches.   Baclofen Other (See Comments)    Loopy    Hydrocodone Nausea And Vomiting   Codeine Nausea And Vomiting   Morphine And Related Nausea And Vomiting   Tdap [Tetanus-Diphth-Acell Pertussis] Swelling    Redness and localized swelling at injection site     Review of Systems  Constitutional:  Negative for activity change, appetite change, chills, diaphoresis, fatigue, fever and unexpected weight change.  HENT:  Negative for congestion, rhinorrhea and sore throat.   Eyes:  Negative  for pain.  Respiratory:  Negative for cough and shortness of breath.   Cardiovascular:  Positive for leg swelling (not new or worsening). Negative for chest pain and palpitations.  Gastrointestinal:  Negative for abdominal pain, anorexia, diarrhea and vomiting.  Genitourinary:  Negative for decreased urine volume and difficulty urinating.  Musculoskeletal:  Negative for joint pain.  Skin:  Positive for color change and rash. Negative for nail changes, pallor and wound.  All other systems reviewed and are negative.      Objective:  BP 130/77   Pulse 79   Temp 98 F (36.7 C)   Ht $R'4\' 11"'BO$  (1.499 m)   Wt 231 lb (104.8 kg)   SpO2 92%   BMI 46.66 kg/m    Wt Readings from Last 3 Encounters:  07/29/21 231 lb (104.8 kg)  07/14/21 233 lb 12.8 oz (106.1 kg)  06/27/21 238 lb 8.6 oz (108.2 kg)    Physical Exam Vitals and nursing note reviewed.  Constitutional:      General: She is not in acute distress.    Appearance: She is morbidly obese. She is not toxic-appearing.  HENT:     Head: Normocephalic and atraumatic.     Mouth/Throat:     Mouth: Mucous membranes are moist.  Eyes:     Pupils: Pupils are equal, round, and reactive to light.  Cardiovascular:     Rate and Rhythm: Normal rate and regular rhythm.  Pulmonary:     Effort: Pulmonary effort is normal.     Breath sounds: Normal breath sounds.  Abdominal:     Palpations: Abdomen is soft.  Musculoskeletal:     Right lower leg: Edema present.     Left lower leg: Edema present.  Skin:    General: Skin is warm and dry.     Capillary Refill: Capillary refill takes less than 2 seconds.     Findings: Erythema and rash present.       Neurological:     General: No focal deficit present.     Mental Status: She is alert and oriented to person, place, and time.     Gait: Abnormal gait: slow, using walker.  Psychiatric:  Mood and Affect: Mood normal.        Behavior: Behavior normal.        Thought Content: Thought content  normal.        Judgment: Judgment normal.     Results for orders placed or performed in visit on 07/14/21  Urine Culture   Specimen: Urine   CU  Result Value Ref Range   Urine Culture, Routine Final report    Organism ID, Bacteria Comment   Microscopic Examination   Urine  Result Value Ref Range   WBC, UA 0-5 0 - 5 /hpf   RBC None seen 0 - 2 /hpf   Epithelial Cells (non renal) 0-10 0 - 10 /hpf   Renal Epithel, UA 0-10 (A) None seen /hpf   Casts Present (A) None seen /lpf   Cast Type Hyaline casts N/A   Bacteria, UA Few (A) None seen/Few  CMP14+EGFR  Result Value Ref Range   Glucose 115 (H) 70 - 99 mg/dL   BUN 23 8 - 27 mg/dL   Creatinine, Ser 1.24 (H) 0.57 - 1.00 mg/dL   eGFR 44 (L) >59 mL/min/1.73   BUN/Creatinine Ratio 19 12 - 28   Sodium 138 134 - 144 mmol/L   Potassium 4.9 3.5 - 5.2 mmol/L   Chloride 98 96 - 106 mmol/L   CO2 18 (L) 20 - 29 mmol/L   Calcium 9.7 8.7 - 10.3 mg/dL   Total Protein 7.9 6.0 - 8.5 g/dL   Albumin 4.7 3.7 - 4.7 g/dL   Globulin, Total 3.2 1.5 - 4.5 g/dL   Albumin/Globulin Ratio 1.5 1.2 - 2.2   Bilirubin Total 0.8 0.0 - 1.2 mg/dL   Alkaline Phosphatase 57 44 - 121 IU/L   AST 43 (H) 0 - 40 IU/L   ALT 32 0 - 32 IU/L  CBC with Differential/Platelet  Result Value Ref Range   WBC 12.3 (H) 3.4 - 10.8 x10E3/uL   RBC 4.91 3.77 - 5.28 x10E6/uL   Hemoglobin 13.8 11.1 - 15.9 g/dL   Hematocrit 42.4 34.0 - 46.6 %   MCV 86 79 - 97 fL   MCH 28.1 26.6 - 33.0 pg   MCHC 32.5 31.5 - 35.7 g/dL   RDW 13.6 11.7 - 15.4 %   Platelets 387 150 - 450 x10E3/uL   Neutrophils 55 Not Estab. %   Lymphs 34 Not Estab. %   Monocytes 7 Not Estab. %   Eos 3 Not Estab. %   Basos 1 Not Estab. %   Neutrophils Absolute 6.8 1.4 - 7.0 x10E3/uL   Lymphocytes Absolute 4.2 (H) 0.7 - 3.1 x10E3/uL   Monocytes Absolute 0.8 0.1 - 0.9 x10E3/uL   EOS (ABSOLUTE) 0.3 0.0 - 0.4 x10E3/uL   Basophils Absolute 0.1 0.0 - 0.2 x10E3/uL   Immature Granulocytes 0 Not Estab. %   Immature  Grans (Abs) 0.0 0.0 - 0.1 x10E3/uL  Urinalysis, Complete  Result Value Ref Range   Specific Gravity, UA 1.010 1.005 - 1.030   pH, UA 6.5 5.0 - 7.5   Color, UA Yellow Yellow   Appearance Ur Clear Clear   Leukocytes,UA Trace (A) Negative   Protein,UA Negative Negative/Trace   Glucose, UA Negative Negative   Ketones, UA Negative Negative   RBC, UA Negative Negative   Bilirubin, UA Negative Negative   Urobilinogen, Ur 0.2 0.2 - 1.0 mg/dL   Nitrite, UA Negative Negative   Microscopic Examination See below:    *Note: Due to a large number of results  and/or encounters for the requested time period, some results have not been displayed. A complete set of results can be found in Results Review.       Pertinent labs & imaging results that were available during my care of the patient were reviewed by me and considered in my medical decision making.  Assessment & Plan:  Tranika was seen today for rash.  Diagnoses and all orders for this visit:  Candidal intertrigo Classic candidal intertrigo. Will treat with Mycolog as prescribed. Pt aware to make sure to keep skin folds clean and dry. Aware to report new, worsening, or persistent symptoms. Will add one oral difucan if not improving.  -     nystatin-triamcinolone ointment (MYCOLOG); Apply 1 application. topically 2 (two) times daily.     Continue all other maintenance medications.  Follow up plan: Return if symptoms worsen or fail to improve.   Continue healthy lifestyle choices, including diet (rich in fruits, vegetables, and lean proteins, and low in salt and simple carbohydrates) and exercise (at least 30 minutes of moderate physical activity daily).  Educational handout given for skin yeast infection   The above assessment and management plan was discussed with the patient. The patient verbalized understanding of and has agreed to the management plan. Patient is aware to call the clinic if they develop any new symptoms or if  symptoms persist or worsen. Patient is aware when to return to the clinic for a follow-up visit. Patient educated on when it is appropriate to go to the emergency department.   Monia Pouch, FNP-C [facility] [phone]

## 2021-07-30 ENCOUNTER — Telehealth: Payer: Self-pay | Admitting: Family

## 2021-07-30 MED ORDER — NYSTATIN 100000 UNIT/GM EX CREA: 1.0000 "application " | TOPICAL_CREAM | Freq: Two times a day (BID) | CUTANEOUS | 0 refills | Status: DC

## 2021-07-30 NOTE — Addendum Note (Signed)
Addended by: Baruch Gouty on: 07/30/2021 10:19 AM   Modules accepted: Orders

## 2021-07-30 NOTE — Telephone Encounter (Signed)
Monia Pouch, FNP has sent in preferred alternative. Nystatin cream.  Patient aware

## 2021-08-05 ENCOUNTER — Telehealth: Payer: Self-pay | Admitting: Family

## 2021-08-05 DIAGNOSIS — B372 Candidiasis of skin and nail: Secondary | ICD-10-CM

## 2021-08-05 MED ORDER — FLUCONAZOLE 150 MG PO TABS: ORAL_TABLET | ORAL | 0 refills | Status: DC

## 2021-08-05 MED ORDER — NYSTATIN-TRIAMCINOLONE 100000-0.1 UNIT/GM-% EX OINT: 1.0000 "application " | TOPICAL_OINTMENT | Freq: Two times a day (BID) | CUTANEOUS | 0 refills | Status: DC

## 2021-08-05 NOTE — Telephone Encounter (Signed)
New medications sent to pharmacy 

## 2021-08-05 NOTE — Telephone Encounter (Signed)
Patient aware and verbalizes understanding. 

## 2021-08-06 ENCOUNTER — Other Ambulatory Visit: Payer: Medicare Other

## 2021-08-08 ENCOUNTER — Other Ambulatory Visit: Payer: Self-pay | Admitting: Family

## 2021-08-08 NOTE — Telephone Encounter (Signed)
Last OV 07/29/2021. Last RF 05/16/202. Next OV 09/19/2021

## 2021-08-14 ENCOUNTER — Other Ambulatory Visit: Payer: Self-pay | Admitting: Family

## 2021-08-16 DIAGNOSIS — Z743 Need for continuous supervision: Secondary | ICD-10-CM | POA: Diagnosis not present

## 2021-08-27 ENCOUNTER — Other Ambulatory Visit: Payer: Self-pay | Admitting: Family

## 2021-08-27 DIAGNOSIS — L282 Other prurigo: Secondary | ICD-10-CM

## 2021-09-02 ENCOUNTER — Ambulatory Visit: Payer: Self-pay | Admitting: *Deleted

## 2021-09-02 NOTE — Chronic Care Management (AMB) (Signed)
  Chronic Care Management   Note  09/02/2021 Name: Sheila [name] MRN: [id number] DOB: 10/04/1940   Patient has not recently engaged with the Chronic Care Management RN Care Manager. Removing RN Care Manager from Care Team and closing [place]. If patient is currently engaged with another CCM team member I will forward this encounter to inform them of my case closure. Patient may be eligible for re-engagement with RN Care Manager in the future if necessary and can discuss this with their PCP.  Chong Sicilian, BSN, RN-BC Embedded Chronic Care Manager [facility] [place] [facility] Direct Dial: [phone]

## 2021-09-08 ENCOUNTER — Other Ambulatory Visit: Payer: Self-pay | Admitting: Family

## 2021-09-08 ENCOUNTER — Other Ambulatory Visit: Payer: Self-pay | Admitting: Family Medicine

## 2021-09-08 DIAGNOSIS — G629 Polyneuropathy, unspecified: Secondary | ICD-10-CM

## 2021-09-08 DIAGNOSIS — F411 Generalized anxiety disorder: Secondary | ICD-10-CM

## 2021-09-08 DIAGNOSIS — F331 Major depressive disorder, recurrent, moderate: Secondary | ICD-10-CM

## 2021-09-16 ENCOUNTER — Other Ambulatory Visit: Payer: Self-pay | Admitting: *Deleted

## 2021-09-16 MED ORDER — PROLIA 60 MG/ML ~~LOC~~ SOSY: 60.0000 mg | PREFILLED_SYRINGE | Freq: Once | SUBCUTANEOUS | 0 refills | Status: AC

## 2021-09-16 NOTE — Progress Notes (Signed)
LM detailed for pt family - aware sending to [facility] and once they pick up to put in fridge and call [facility] to sch injection appt with triage

## 2021-09-17 ENCOUNTER — Telehealth: Payer: Self-pay | Admitting: Family

## 2021-09-17 NOTE — Telephone Encounter (Signed)
Patient's daughter calling to check on her Prolia shot. Please call back.

## 2021-09-18 ENCOUNTER — Other Ambulatory Visit: Payer: Self-pay | Admitting: Family

## 2021-09-18 DIAGNOSIS — G8929 Other chronic pain: Secondary | ICD-10-CM

## 2021-09-18 DIAGNOSIS — Z79899 Other long term (current) drug therapy: Secondary | ICD-10-CM

## 2021-09-19 ENCOUNTER — Encounter: Payer: Self-pay | Admitting: Family

## 2021-09-19 ENCOUNTER — Ambulatory Visit (INDEPENDENT_AMBULATORY_CARE_PROVIDER_SITE_OTHER): Payer: Medicare Other | Admitting: Family

## 2021-09-19 VITALS — BP 121/81 | HR 82 | Temp 98.1°F | Ht 59.0 in | Wt 237.6 lb

## 2021-09-19 DIAGNOSIS — M810 Age-related osteoporosis without current pathological fracture: Secondary | ICD-10-CM | POA: Diagnosis not present

## 2021-09-19 DIAGNOSIS — E782 Mixed hyperlipidemia: Secondary | ICD-10-CM | POA: Diagnosis not present

## 2021-09-19 DIAGNOSIS — N1832 Chronic kidney disease, stage 3b: Secondary | ICD-10-CM | POA: Diagnosis not present

## 2021-09-19 DIAGNOSIS — F331 Major depressive disorder, recurrent, moderate: Secondary | ICD-10-CM

## 2021-09-19 DIAGNOSIS — Z79899 Other long term (current) drug therapy: Secondary | ICD-10-CM

## 2021-09-19 DIAGNOSIS — E210 Primary hyperparathyroidism: Secondary | ICD-10-CM | POA: Diagnosis not present

## 2021-09-19 DIAGNOSIS — G8929 Other chronic pain: Secondary | ICD-10-CM | POA: Diagnosis not present

## 2021-09-19 DIAGNOSIS — M159 Polyosteoarthritis, unspecified: Secondary | ICD-10-CM | POA: Diagnosis not present

## 2021-09-19 DIAGNOSIS — M549 Dorsalgia, unspecified: Secondary | ICD-10-CM

## 2021-09-19 DIAGNOSIS — J449 Chronic obstructive pulmonary disease, unspecified: Secondary | ICD-10-CM

## 2021-09-19 DIAGNOSIS — F411 Generalized anxiety disorder: Secondary | ICD-10-CM

## 2021-09-19 DIAGNOSIS — Z0289 Encounter for other administrative examinations: Secondary | ICD-10-CM

## 2021-09-19 DIAGNOSIS — F1320 Sedative, hypnotic or anxiolytic dependence, uncomplicated: Secondary | ICD-10-CM

## 2021-09-19 DIAGNOSIS — E039 Hypothyroidism, unspecified: Secondary | ICD-10-CM

## 2021-09-19 MED ORDER — ALPRAZOLAM 0.5 MG PO TABS: 0.5000 mg | ORAL_TABLET | Freq: Two times a day (BID) | ORAL | 2 refills | Status: DC | PRN

## 2021-09-19 MED ORDER — DENOSUMAB 60 MG/ML ~~LOC~~ SOSY
60.0000 mg | PREFILLED_SYRINGE | Freq: Once | SUBCUTANEOUS | Status: AC
  Administered 2021-09-19: 60 mg via SUBCUTANEOUS

## 2021-09-19 MED ORDER — TRAMADOL HCL 50 MG PO TABS: 100.0000 mg | ORAL_TABLET | Freq: Two times a day (BID) | ORAL | 2 refills | Status: DC | PRN

## 2021-09-19 NOTE — Patient Instructions (Signed)
Osteoarthritis  Osteoarthritis is a type of arthritis. It refers to joint pain or joint disease. Osteoarthritis affects tissue that covers the ends of bones in joints (cartilage). Cartilage acts as a cushion between the bones and helps them move smoothly. Osteoarthritis occurs when cartilage in the joints gets worn down. Osteoarthritis is sometimes called "wear and tear" arthritis. Osteoarthritis is the most common form of arthritis. It often occurs in older people. It is a condition that gets worse over time. The joints most often affected by this condition are in the fingers, toes, hips, knees, and spine, including the neck and lower back. What are the causes? This condition is caused by the wearing down of cartilage that covers the ends of bones. What increases the risk? The following factors may make you more likely to develop this condition: Being age 50 or older. Obesity. Overuse of joints. Past injury of a joint. Past surgery on a joint. Family history of osteoarthritis. What are the signs or symptoms? The main symptoms of this condition are pain, swelling, and stiffness in the joint. Other symptoms may include: An enlarged joint. More pain and further damage caused by small pieces of bone or cartilage that break off and float inside of the joint. Small deposits of bone (osteophytes) that grow on the edges of the joint. A grating or scraping feeling inside the joint when you move it. Popping or creaking sounds when you move. Difficulty walking or exercising. An inability to grip items, twist your hand(s), or control the movements of your hands and fingers. How is this diagnosed? This condition may be diagnosed based on: Your medical history. A physical exam. Your symptoms. X-rays of the affected joint(s). Blood tests to rule out other types of arthritis. How is this treated? There is no cure for this condition, but treatment can help control pain and improve joint function.  Treatment may include a combination of therapies, such as: Pain relief techniques, such as: Applying heat and cold to the joint. Massage. A form of talk therapy called cognitive behavioral therapy (CBT). This therapy helps you set goals and follow up on the changes that you make. Medicines for pain and inflammation. The medicines can be taken by mouth or applied to the skin. They include: NSAIDs, such as ibuprofen. Prescription medicines. Strong anti-inflammatory medicines (corticosteroids). Certain nutritional supplements. A prescribed exercise program. You may work with a physical therapist. Assistive devices, such as a brace, wrap, splint, specialized glove, or cane. A weight control plan. Surgery, such as: An osteotomy. This is done to reposition the bones and relieve pain or to remove loose pieces of bone and cartilage. Joint replacement surgery. You may need this surgery if you have advanced osteoarthritis. Follow these instructions at home: Activity Rest your affected joints as told by your health care provider. Exercise as told by your health care provider. He or she may recommend specific types of exercise, such as: Strengthening exercises. These are done to strengthen the muscles that support joints affected by arthritis. Aerobic activities. These are exercises, such as brisk walking or water aerobics, that increase your heart rate. Range-of-motion activities. These help your joints move more easily. Balance and agility exercises. Managing pain, stiffness, and swelling     If directed, apply heat to the affected area as often as told by your health care provider. Use the heat source that your health care provider recommends, such as a moist heat pack or a heating pad. If you have a removable assistive device, remove it   as told by your health care provider. Place a towel between your skin and the heat source. If your health care provider tells you to keep the assistive device  on while you apply heat, place a towel between the assistive device and the heat source. Leave the heat on for 20-30 minutes. Remove the heat if your skin turns bright red. This is especially important if you are unable to feel pain, heat, or cold. You may have a greater risk of getting burned. If directed, put ice on the affected area. To do this: If you have a removable assistive device, remove it as told by your health care provider. Put ice in a plastic bag. Place a towel between your skin and the bag. If your health care provider tells you to keep the assistive device on during icing, place a towel between the assistive device and the bag. Leave the ice on for 20 minutes, 2-3 times a day. Move your fingers or toes often to reduce stiffness and swelling. Raise (elevate) the injured area above the level of your heart while you are sitting or lying down. General instructions Take over-the-counter and prescription medicines only as told by your health care provider. Maintain a healthy weight. Follow instructions from your health care provider for weight control. Do not use any products that contain nicotine or tobacco, such as cigarettes, e-cigarettes, and chewing tobacco. If you need help quitting, ask your health care provider. Use assistive devices as told by your health care provider. Keep all follow-up visits as told by your health care provider. This is important. Where to find more information National Institute of Arthritis and Musculoskeletal and Skin Diseases: www.niams.nih.gov National Institute on Aging: www.nia.nih.gov American College of Rheumatology: www.rheumatology.org Contact a health care provider if: You have redness, swelling, or a feeling of warmth in a joint that gets worse. You have a fever along with joint or muscle aches. You develop a rash. You have trouble doing your normal activities. Get help right away if: You have pain that gets worse and is not relieved by  pain medicine. Summary Osteoarthritis is a type of arthritis that affects tissue covering the ends of bones in joints (cartilage). This condition is caused by the wearing down of cartilage that covers the ends of bones. The main symptom of this condition is pain, swelling, and stiffness in the joint. There is no cure for this condition, but treatment can help control pain and improve joint function. This information is not intended to replace advice given to you by your health care provider. Make sure you discuss any questions you have with your health care provider. Document Revised: 02/06/2019 Document Reviewed: 02/06/2019 Elsevier Patient Education  2023 [employer]  

## 2021-09-19 NOTE — Progress Notes (Signed)
Subjective:    Patient ID: Sheila [name], female    DOB: 1940-05-16, 81 y.o.   MRN: [id number]  Chief Complaint  Patient presents with   Medical Management of Chronic Issues   Pain    Slept in chair last  night      Pt presents to the office today for chronic follow up. She is followed by Cardiologists every 3 months for CAD and CHF. She is currently  has an aid and family that stays with her 24/7.   She reports she is having a hard time walking long distances. She has to use a rolling walker at all times, however, she can not walk more than a few feet without having to relax.    She saw an Endocrinologists for  hypercalcemia & hyperparathyroidism. She is followed by Dermatologists for skin lesion.    She has COPD and states her breathing is stable as long as she it sitting and does not have to wear a mask. States she gets SOB with exertion.    She has CKD and tries to limit NSAID's. Hypertension This is a chronic problem. The current episode started more than 1 year ago. The problem has been resolved since onset. The problem is controlled. Associated symptoms include anxiety, malaise/fatigue and shortness of breath. Pertinent negatives include no peripheral edema. Risk factors for coronary artery disease include dyslipidemia, obesity and sedentary lifestyle. The current treatment provides moderate improvement. There is no history of heart failure. Identifiable causes of hypertension include a thyroid problem.  Congestive Heart Failure Presents for follow-up visit. Associated symptoms include edema, fatigue and shortness of breath. The symptoms have been stable.  Thyroid Problem Presents for follow-up visit. Symptoms include anxiety, depressed mood and fatigue. Patient reports no constipation or diarrhea. The symptoms have been stable. Her past medical history is significant for hyperlipidemia. There is no history of heart failure.  Arthritis Presents for follow-up visit. She complains of  pain and stiffness. The symptoms have been stable. Affected locations include the right knee, left knee, left hip and right hip. Her pain is at a severity of 8/10. Associated symptoms include fatigue. Pertinent negatives include no diarrhea.  Hyperlipidemia This is a chronic problem. The current episode started more than 1 year ago. The problem is controlled. Recent lipid tests were reviewed and are normal. Exacerbating diseases include obesity. Associated symptoms include shortness of breath. Current antihyperlipidemic treatment includes statins. The current treatment provides moderate improvement of lipids. Risk factors for coronary artery disease include dyslipidemia, hypertension, a sedentary lifestyle and post-menopausal.  Anxiety Presents for follow-up visit. Symptoms include depressed mood, excessive worry, irritability, nervous/anxious behavior and shortness of breath. Symptoms occur occasionally. The severity of symptoms is moderate.    Depression        This is a chronic problem.  The current episode started more than 1 year ago.   Associated symptoms include fatigue, helplessness, hopelessness and sad.  Past medical history includes thyroid problem and anxiety.     Current opioids rx- Ultram 50 mg #45 and Xanax 0.5 mg #60 Effectiveness of current meds-stable Adverse reactions from pain meds-none Morphine equivalent- 20  Pill count performed-No Last drug screen - 05/15/21 ( high risk q3m, moderate risk q97m, low risk yearly ) Urine drug screen today- No Was the [facility] reviewed- Yes  If yes were their any concerning findings? - none  Pain contract signed on: 04/25/21   Review of Systems  Constitutional:  Positive for fatigue, irritability and malaise/fatigue.  Respiratory:  Positive for shortness of breath.   Gastrointestinal:  Negative for constipation and diarrhea.  Musculoskeletal:  Positive for arthritis and stiffness.  Psychiatric/Behavioral:  Positive for depression. The  patient is nervous/anxious.   All other systems reviewed and are negative.      Objective:   Physical Exam Vitals reviewed.  Constitutional:      General: She is not in acute distress.    Appearance: She is well-developed. She is obese.  HENT:     Head: Normocephalic and atraumatic.     Right Ear: Tympanic membrane normal.     Left Ear: Tympanic membrane normal.  Eyes:     Pupils: Pupils are equal, round, and reactive to light.  Neck:     Thyroid: No thyromegaly.  Cardiovascular:     Rate and Rhythm: Normal rate and regular rhythm.     Heart sounds: Normal heart sounds. No murmur heard. Pulmonary:     Effort: Pulmonary effort is normal. No respiratory distress.     Breath sounds: Normal breath sounds. No wheezing.     Comments: Diminished  Abdominal:     General: Bowel sounds are normal. There is no distension.     Palpations: Abdomen is soft.     Tenderness: There is no abdominal tenderness.  Musculoskeletal:        General: No tenderness. Normal range of motion.     Cervical back: Normal range of motion and neck supple.  Skin:    General: Skin is warm and dry.  Neurological:     Mental Status: She is alert and oriented to person, place, and time.     Cranial Nerves: No cranial nerve deficit.     Deep Tendon Reflexes: Reflexes are normal and symmetric.  Psychiatric:        Behavior: Behavior normal.        Thought Content: Thought content normal.        Judgment: Judgment normal.       BP 121/81   Pulse 82   Temp 98.1 F (36.7 C) (Temporal)   Ht $R'4\' 11"'qg$  (1.499 m)   Wt 237 lb 9.6 oz (107.8 kg)   SpO2 94%   BMI 47.99 kg/m      Assessment & Plan:  Sheila [name] comes in today with chief complaint of Medical Management of Chronic Issues and Pain (Slept in chair last  night )   Diagnosis and orders addressed:  1. Controlled substance agreement signed - traMADol (ULTRAM) 50 MG tablet; Take 2 tablets (100 mg total) by mouth every 12 (twelve) hours as needed.  TAKE TWO TABLETS EVERY TWELVE HOURS AS NEEDED FOR PAIN  Dispense: 45 tablet; Refill: 2 - ALPRAZolam (XANAX) 0.5 MG tablet; Take 1 tablet (0.5 mg total) by mouth 2 (two) times daily as needed.  Dispense: 60 tablet; Refill: 2 - CMP14+EGFR  2. Chronic bilateral back pain, unspecified back location - traMADol (ULTRAM) 50 MG tablet; Take 2 tablets (100 mg total) by mouth every 12 (twelve) hours as needed. TAKE TWO TABLETS EVERY TWELVE HOURS AS NEEDED FOR PAIN  Dispense: 45 tablet; Refill: 2 - CMP14+EGFR  3. Age related osteoporosis, unspecified pathological fracture presence - CMP14+EGFR  4. Osteoporosis, unspecified osteoporosis type, unspecified pathological fracture presence - denosumab (PROLIA) injection 60 mg - CMP14+EGFR  5. Chronic obstructive pulmonary disease, unspecified COPD type ([place]) - CMP14+EGFR  6. Hypothyroidism, unspecified type - CMP14+EGFR - TSH  7. Primary hyperparathyroidism ([place]) - CMP14+EGFR  8. Primary osteoarthritis involving multiple  joints - traMADol (ULTRAM) 50 MG tablet; Take 2 tablets (100 mg total) by mouth every 12 (twelve) hours as needed. TAKE TWO TABLETS EVERY TWELVE HOURS AS NEEDED FOR PAIN  Dispense: 45 tablet; Refill: 2 - CMP14+EGFR  9. Stage 3b chronic kidney disease ([place]) - CMP14+EGFR  10. GAD (generalized anxiety disorder) - ALPRAZolam (XANAX) 0.5 MG tablet; Take 1 tablet (0.5 mg total) by mouth 2 (two) times daily as needed.  Dispense: 60 tablet; Refill: 2 - CMP14+EGFR  11. Mixed hyperlipidemia - CMP14+EGFR  12. Moderate episode of recurrent major depressive disorder (HCC) - CMP14+EGFR  13. Morbid obesity ([place]) - CMP14+EGFR  14. Pain medication agreement signed - CMP14+EGFR  15. Benzodiazepine dependence ([facility]) - ALPRAZolam (XANAX) 0.5 MG tablet; Take 1 tablet (0.5 mg total) by mouth 2 (two) times daily as needed.  Dispense: 60 tablet; Refill: 2 - CMP14+EGFR   Labs pending Patient reviewed in [place] controlled database, no flags  noted. Contract and drug screen are up to date.  Health Maintenance reviewed Diet and exercise encouraged  Follow up plan: 3 months    [name], FNP

## 2021-09-20 LAB — CMP14+EGFR
ALT: 22 IU/L (ref 0–32)
AST: 27 IU/L (ref 0–40)
Albumin/Globulin Ratio: 1.5 (ref 1.2–2.2)
Albumin: 4.6 g/dL (ref 3.8–4.8)
Alkaline Phosphatase: 63 IU/L (ref 44–121)
BUN/Creatinine Ratio: 36 — ABNORMAL HIGH (ref 12–28)
BUN: 47 mg/dL — ABNORMAL HIGH (ref 8–27)
Bilirubin Total: 0.5 mg/dL (ref 0.0–1.2)
CO2: 20 mmol/L (ref 20–29)
Calcium: 11 mg/dL — ABNORMAL HIGH (ref 8.7–10.3)
Chloride: 99 mmol/L (ref 96–106)
Creatinine, Ser: 1.32 mg/dL — ABNORMAL HIGH (ref 0.57–1.00)
Globulin, Total: 3.1 g/dL (ref 1.5–4.5)
Glucose: 123 mg/dL — ABNORMAL HIGH (ref 70–99)
Potassium: 4.5 mmol/L (ref 3.5–5.2)
Sodium: 140 mmol/L (ref 134–144)
Total Protein: 7.7 g/dL (ref 6.0–8.5)
eGFR: 41 mL/min/{1.73_m2} — ABNORMAL LOW (ref >59)

## 2021-09-20 LAB — TSH: TSH: 5.12 u[IU]/mL — ABNORMAL HIGH (ref 0.450–4.500)

## 2021-09-22 ENCOUNTER — Other Ambulatory Visit: Payer: Self-pay | Admitting: Family

## 2021-09-22 MED ORDER — LEVOTHYROXINE SODIUM 100 MCG PO TABS: 100.0000 ug | ORAL_TABLET | Freq: Every day | ORAL | 1 refills | Status: DC

## 2021-09-24 ENCOUNTER — Other Ambulatory Visit: Payer: Self-pay | Admitting: Family

## 2021-09-24 DIAGNOSIS — L282 Other prurigo: Secondary | ICD-10-CM

## 2021-09-26 ENCOUNTER — Ambulatory Visit (INDEPENDENT_AMBULATORY_CARE_PROVIDER_SITE_OTHER): Payer: Medicare Other | Admitting: Family

## 2021-09-26 ENCOUNTER — Encounter: Payer: Self-pay | Admitting: Family

## 2021-09-26 DIAGNOSIS — L282 Other prurigo: Secondary | ICD-10-CM

## 2021-09-26 DIAGNOSIS — B372 Candidiasis of skin and nail: Secondary | ICD-10-CM | POA: Diagnosis not present

## 2021-09-26 MED ORDER — NYSTATIN 100000 UNIT/GM EX CREA: 1.0000 | TOPICAL_CREAM | Freq: Two times a day (BID) | CUTANEOUS | 1 refills | Status: DC

## 2021-09-26 MED ORDER — PREDNISONE 20 MG PO TABS: 40.0000 mg | ORAL_TABLET | Freq: Every day | ORAL | 0 refills | Status: AC

## 2021-09-26 MED ORDER — NYSTATIN 100000 UNIT/GM EX POWD: 1.0000 | Freq: Three times a day (TID) | CUTANEOUS | 2 refills | Status: DC

## 2021-09-26 NOTE — Patient Instructions (Signed)
SARNA LOTION  Pruritus Pruritus is an itchy feeling on the skin. One of the most common causes is dry skin, but many different things can cause itching. Most cases of itching do not require medical attention. Sometimes itchy skin can turn into a rash or a secondary infection. Follow these instructions at home: Skin care  Do not use scented soaps, detergents, perfumes, and cosmetic products. Instead, use gentle, unscented versions of these items. Apply moisturizing creams to your skin frequently, at least twice daily. Apply immediately after bathing while skin is still wet. Take medicines or apply medicated creams only as told by your health care provider. This may include: Corticosteroid cream or topical calcineurin inhibitor. Anti-itch lotions containing urea, camphor, or menthol. Oral antihistamines. Do not take hot showers or baths, which can make itching worse. A short, cool shower may help with itching as long as you apply moisturizing lotion after the shower. Apply a cool, wet cloth (cool compress) to the affected areas. You may take lukewarm baths with one of the following: Epsom salts. You can get these at your local pharmacy or grocery store. Follow the instructions on the packaging. Baking soda. Pour a small amount into the bath as told by your health care provider. Colloidal oatmeal. You can get this at your local pharmacy or grocery store. Follow the instructions on the packaging. Do not scratch your skin. General instructions Avoid wearing tight clothes. Keep a journal to help find out what is causing your itching. Write down: What you eat and drink. What cosmetic products you use. What soaps or detergents you use. What you wear, including jewelry. Use a humidifier. This keeps the air moist, which helps to prevent dry skin. Be aware of any changes in your itchiness. Tell your health care provider about any changes. Contact a health care provider if: The itching does not go  away after several days. You notice redness, warmth, or drainage on the skin where you have scratched. You are unusually thirsty or urinating more than normal. Your skin tingles or feels numb. Your skin or the white parts of your eyes turn yellow (jaundice). You feel weak. You have any of the following: Night sweats. Tiredness (fatigue). Weight loss. Abdominal pain. Summary Pruritus is an itchy feeling on the skin. One of the most common causes is dry skin, but many different conditions and factors can cause itching. Apply moisturizing creams to your skin frequently, at least twice daily. Apply immediately after bathing while skin is still wet. Take medicines or apply medicated creams only as told by your health care provider. Do not take hot showers or baths. Do not use scented soaps, detergents, perfumes, or cosmetic products. Keep a journal to help find out what is causing your itching. This information is not intended to replace advice given to you by your health care provider. Make sure you discuss any questions you have with your health care provider. Document Revised: 03/19/2021 Document Reviewed: 03/19/2021 Elsevier Patient Education  2023 Else[employer] Inc.  

## 2021-09-26 NOTE — Progress Notes (Signed)
Virtual Visit  Note Due to COVID-19 pandemic this visit was conducted virtually. This visit type was conducted due to national recommendations for restrictions regarding the COVID-19 Pandemic (e.g. social distancing, sheltering in place) in an effort to limit this patient's exposure and mitigate transmission in our community. All issues noted in this document were discussed and addressed.  A physical exam was not performed with this format.  I connected with Sheila [name] on 09/26/21 at 11:40 AM  by telephone and verified that I am speaking with the correct person using two identifiers. Sheila [name] is currently located at home and daughter is currently with her during visit. The provider, [name], FNP is located in their office at time of visit.  I discussed the limitations, risks, security and privacy concerns of performing an evaluation and management service by telephone and the availability of in person appointments. I also discussed with the patient that there may be a patient responsible charge related to this service. The patient expressed understanding and agreed to proceed.  Sheila [name], Sheila [name] are scheduled for a virtual visit with your provider today.    Just as we do with appointments in the office, we must obtain your consent to participate.  Your consent will be active for this visit and any virtual visit you may have with one of our providers in the next 365 days.    If you have a MyChart account, I can also send a copy of this consent to you electronically.  All virtual visits are billed to your insurance company just like a traditional visit in the office.  As this is a virtual visit, video technology does not allow for your provider to perform a traditional examination.  This may limit your provider's ability to fully assess your condition.  If your provider identifies any concerns that need to be evaluated in person or the need to arrange testing such as labs, EKG, etc, we will make  arrangements to do so.    Although advances in technology are sophisticated, we cannot ensure that it will always work on either your end or our end.  If the connection with a video visit is poor, we may have to switch to a telephone visit.  With either a video or telephone visit, we are not always able to ensure that we have a secure connection.   I need to obtain your verbal consent now.   Are you willing to proceed with your visit today?   Sheila [name] has provided verbal consent on 09/26/2021 for a virtual visit (video or telephone).   [name], [place] 09/26/2021  11:41 AM   History and Present Illness:  Pt calls the office today with a yeast rash and pruritic rash. Reports she is itching all over with mild redness. She has been using Vistaril 25 mg TID and Xyzal 5 mg with no relief.  Rash This is a new problem. The current episode started 1 to 4 weeks ago. The problem is unchanged. Location: under abdomen. The rash is characterized by burning, pain and redness. She was exposed to nothing.     Review of Systems  Skin:  Positive for rash.     Observations/Objective: No SOB or distress noted, moaning "because of the itching".   Assessment and Plan: 1. Candidal skin infection - nystatin (MYCOSTATIN/NYSTOP) powder; Apply 1 Application topically 3 (three) times daily.  Dispense: 45 g; Refill: 2 - nystatin cream (MYCOSTATIN); Apply 1 Application topically 2 (two) times daily.  Dispense: 60 g; Refill: 1  2. Pruritic rash - predniSONE (DELTASONE) 20 MG tablet; Take 2 tablets (40 mg total) by mouth daily with breakfast for 5 days.  Dispense: 10 tablet; Refill: 0  Will give prednisone  Continue Xyzal and Vistaril  Nystatin powder and cream given for yeast  Keep clean and dry Avoid scratching   I discussed the assessment and treatment plan with the patient. The patient was provided an opportunity to ask questions and all were answered. The patient agreed with the plan and demonstrated  an understanding of the instructions.   The patient was advised to call back or seek an in-person evaluation if the symptoms worsen or if the condition fails to improve as anticipated.  The above assessment and management plan was discussed with the patient. The patient verbalized understanding of and has agreed to the management plan. Patient is aware to call the clinic if symptoms persist or worsen. Patient is aware when to return to the clinic for a follow-up visit. Patient educated on when it is appropriate to go to the emergency department.   Time call ended:  11:52 AM   I provided 12 minutes of  non face-to-face time during this encounter.    [name], FNP

## 2021-09-29 NOTE — Telephone Encounter (Signed)
Pt got injection on 09/19/21 Closing encounter

## 2021-10-02 ENCOUNTER — Other Ambulatory Visit: Payer: Self-pay | Admitting: Family

## 2021-10-03 ENCOUNTER — Other Ambulatory Visit: Payer: Self-pay | Admitting: Family

## 2021-10-09 ENCOUNTER — Telehealth: Payer: Self-pay | Admitting: Family

## 2021-10-09 DIAGNOSIS — L282 Other prurigo: Secondary | ICD-10-CM

## 2021-10-09 NOTE — Telephone Encounter (Signed)
Pts CNA Crystal called stating that pts PCP prescribed pt some medicine on 09/26/21 for itching and says the medicine helped while pt was taking it but as soon as she stopped taking it, the itching came back.  Wants to know if there is something else PCP can prescribe to pt.  Please advise.

## 2021-10-10 NOTE — Telephone Encounter (Signed)
Lmtcb.

## 2021-10-10 NOTE — Telephone Encounter (Signed)
Referral placed to dermatologists.

## 2021-10-13 ENCOUNTER — Ambulatory Visit (INDEPENDENT_AMBULATORY_CARE_PROVIDER_SITE_OTHER): Payer: Medicare Other | Admitting: Family

## 2021-10-13 ENCOUNTER — Encounter: Payer: Self-pay | Admitting: Family

## 2021-10-13 DIAGNOSIS — G8929 Other chronic pain: Secondary | ICD-10-CM

## 2021-10-13 DIAGNOSIS — R0602 Shortness of breath: Secondary | ICD-10-CM | POA: Diagnosis not present

## 2021-10-13 DIAGNOSIS — I5022 Chronic systolic (congestive) heart failure: Secondary | ICD-10-CM | POA: Diagnosis not present

## 2021-10-13 DIAGNOSIS — M159 Polyosteoarthritis, unspecified: Secondary | ICD-10-CM

## 2021-10-13 DIAGNOSIS — M545 Low back pain, unspecified: Secondary | ICD-10-CM | POA: Diagnosis not present

## 2021-10-13 DIAGNOSIS — J449 Chronic obstructive pulmonary disease, unspecified: Secondary | ICD-10-CM | POA: Diagnosis not present

## 2021-10-13 NOTE — Progress Notes (Signed)
Virtual Visit Consent   Sheila [name], you are scheduled for a virtual visit with a [facility] provider today. Just as with appointments in the office, your consent must be obtained to participate. Your consent will be active for this visit and any virtual visit you may have with one of our providers in the next 365 days. If you have a MyChart account, a copy of this consent can be sent to you electronically.  As this is a virtual visit, video technology does not allow for your provider to perform a traditional examination. This may limit your provider's ability to fully assess your condition. If your provider identifies any concerns that need to be evaluated in person or the need to arrange testing (such as labs, EKG, etc.), we will make arrangements to do so. Although advances in technology are sophisticated, we cannot ensure that it will always work on either your end or our end. If the connection with a video visit is poor, the visit may have to be switched to a telephone visit. With either a video or telephone visit, we are not always able to ensure that we have a secure connection.  By engaging in this virtual visit, you consent to the provision of healthcare and authorize for your insurance to be billed (if applicable) for the services provided during this visit. Depending on your insurance coverage, you may receive a charge related to this service.  I need to obtain your verbal consent now. Are you willing to proceed with your visit today? [name] has provided verbal consent on 10/13/2021 for a virtual visit (video or telephone). [name], FNP  Date: 10/13/2021 4:09 PM  Virtual Visit via Video Note   I, [name], connected with  Sheila [name]  ([id number], May 21, 1940) on 10/13/21 at  3:40 PM EDT by a video-enabled telemedicine application and verified that I am speaking with the correct person using two identifiers.  Location: Patient: Virtual Visit Location Patient:  Home Provider: Virtual Visit Location Provider: Home Office   I discussed the limitations of evaluation and management by telemedicine and the availability of in person appointments. The patient expressed understanding and agreed to proceed.    History of Present Illness: Sheila [name] is a 81 y.o. who identifies as a female who was assigned female at birth, and is being seen today for requesting new rx for lift chair. States she sleeps in her lift chair at night. She had bedbugs and had to throw her chair out. She has chair at this time, but is having difficulties getting in out and out.   She has CHF and COPD and can not lay flat.   She has osteoarthritis in her knees and back that is a 8 out 10. [name]  HPI: HPI  Problems:  Patient Active Problem List   Diagnosis Date Noted   Acute metabolic encephalopathy 59/56/3875   SIRS (systemic inflammatory response syndrome) ([place]) 64/33/2951   Chronic systolic CHF (congestive heart failure) ([place]) 03/06/2021   Urinary incontinence 06/03/2020   Chronic kidney disease, stage 3 unspecified ([place]) 04/18/2020   Primary hyperparathyroidism ([place]) 04/02/2020   LV dysfunction 10/02/2019   NICM (nonischemic cardiomyopathy) ([place]) 10/02/2019   Shortness of breath 08/29/2019   Atypical chest pain 08/29/2019   Controlled substance agreement signed 07/26/2019   Osteoarthritis of right shoulder 02/24/2018   Smokeless tobacco use 11/11/2017   COPD (chronic obstructive pulmonary disease) ([place]) 09/09/2017   Unilateral primary osteoarthritis, right knee 03/04/2017   Spondylolisthesis  of lumbar region 08/24/2016   Benzodiazepine dependence ([place]) 04/29/2015   Pain medication agreement signed 04/29/2015   Chronic back pain 04/29/2015   Osteoarthritis 16/08/3708   Metabolic syndrome 62/69/4854   Morbid obesity ([place]) 10/18/2014   Neuropathy 10/18/2014   Hypokalemia 10/18/2014   Depressed 09/05/2013   Osteoporosis 06/16/2012   PUD (peptic ulcer disease) 06/16/2012    GAD (generalized anxiety disorder) 06/16/2012   Hyperlipidemia 06/16/2012   DJD (degenerative joint disease) 06/16/2012   Hypercalcemia 06/16/2012   Essential hypertension, benign 06/16/2012   Gastric ulcer with hemorrhage 01/14/2011   Diverticulosis of colon 01/13/2011   Prerenal azotemia 01/13/2011   Leukocytosis 01/13/2011   Sacral insufficiency fracture 01/13/2011   Hypothyroidism 01/13/2011   Orthostatic lightheadedness 01/13/2011   ABNORMAL EKG 07/16/2009    Allergies:  Allergies  Allergen Reactions   Ibuprofen Other (See Comments)    History of bleeding ulcers - contra-indicated    Benadryl [Diphenhydramine Hcl] Other (See Comments)    Worsening depression    Lipitor [Atorvastatin] Other (See Comments)    Body aches.   Baclofen Other (See Comments)    Loopy    Hydrocodone Nausea And Vomiting   Codeine Nausea And Vomiting   Morphine And Related Nausea And Vomiting   Tdap [Tetanus-Diphth-Acell Pertussis] Swelling    Redness and localized swelling at injection site    Medications:  Current Outpatient Medications:    albuterol (PROVENTIL) (2.5 MG/3ML) 0.083% nebulizer solution, NEBULIZE 1 VIAL EVERY 6 TO 8 HOURS AS NEEDED FOR WHEEZING OR SHORTNESS OF BREATH, Disp: 180 mL, Rfl: 0   ALPRAZolam (XANAX) 0.5 MG tablet, Take 1 tablet (0.5 mg total) by mouth 2 (two) times daily as needed., Disp: 60 tablet, Rfl: 2   Budeson-Glycopyrrol-Formoterol (BREZTRI AEROSPHERE) 160-9-4.8 MCG/ACT AERO, Inhale 2 puffs into the lungs 2 (two) times daily., Disp: 10.7 g, Rfl: 11   Cholecalciferol (VITAMIN D3) 25 MCG (1000 UT) CAPS, Take 1,000 Units by mouth daily. , Disp: , Rfl:    cinacalcet (SENSIPAR) 30 MG tablet, Take 1 tablet (30 mg total) by mouth every other day., Disp: 60 tablet, Rfl: 2   CRANBERRY PO, Take 1 tablet by mouth daily., Disp: , Rfl:    ENTRESTO 49-51 MG, TAKE 1 TABLET 2 TIMES A DAY, Disp: 60 tablet, Rfl: 11   gabapentin (NEURONTIN) 300 MG capsule, TAKE ONE CAPSULE THREE  TIMES DAILY, Disp: 270 capsule, Rfl: 1   hydrOXYzine (VISTARIL) 25 MG capsule, TAKE ONE CAPSULE THREE TIMES DAILY AS NEEDED, Disp: 90 capsule, Rfl: 1   levocetirizine (XYZAL) 5 MG tablet, TAKE ONE TABLET ONCE DAILY EVERY EVENING, Disp: 30 tablet, Rfl: 5   levothyroxine (SYNTHROID) 100 MCG tablet, Take 1 tablet (100 mcg total) by mouth daily., Disp: 90 tablet, Rfl: 1   LIVALO 4 MG TABS, TAKE 1 TABLET DAILY, Disp: 90 tablet, Rfl: 0   metoprolol tartrate (LOPRESSOR) 25 MG tablet, Take 0.5 tablets (12.5 mg total) by mouth 2 (two) times daily., Disp: 60 tablet, Rfl: 3   nystatin (MYCOSTATIN/NYSTOP) powder, Apply 1 Application topically 3 (three) times daily., Disp: 45 g, Rfl: 2   nystatin cream (MYCOSTATIN), Apply 1 Application topically 2 (two) times daily., Disp: 60 g, Rfl: 1   pantoprazole (PROTONIX) 40 MG tablet, Take 1 tablet (40 mg total) by mouth daily., Disp: 90 tablet, Rfl: 3   sertraline (ZOLOFT) 100 MG tablet, TAKE ONE TABLET ONCE DAILY, Disp: 90 tablet, Rfl: 1   spironolactone (ALDACTONE) 25 MG tablet, TAKE 1 TABLET ONCE DAILY, Disp: 30  tablet, Rfl: 11   Tetrahydrozoline HCl (VISINE OP), Place 1 drop into both eyes daily as needed (dry eyes/itching)., Disp: , Rfl:    torsemide (DEMADEX) 20 MG tablet, Take 1 tablet (20 mg total) by mouth 2 (two) times daily. TAKE 2 TABLETS DAILY AS DIRECTED Strength: 20 mg, Disp: 180 tablet, Rfl: 1   traMADol (ULTRAM) 50 MG tablet, Take 2 tablets (100 mg total) by mouth every 12 (twelve) hours as needed. TAKE TWO TABLETS EVERY TWELVE HOURS AS NEEDED FOR PAIN, Disp: 45 tablet, Rfl: 2  Observations/Objective: Patient is well-developed, well-nourished in no acute distress.  Resting comfortably  at home.  Head is normocephalic, atraumatic.  No labored breathing.  Speech is clear and coherent with logical content.  Patient is alert and oriented at baseline.    Assessment and Plan: 1. Chronic systolic CHF (congestive heart failure) ([place]) - For home use only  DME Other see comment  2. Chronic obstructive pulmonary disease, unspecified COPD type ([place]) - For home use only DME Other see comment  3. Primary osteoarthritis involving multiple joints - For home use only DME Other see comment  4. Chronic bilateral low back pain, unspecified whether sciatica present - For home use only DME Other see comment  5. Shortness of breath - For home use only DME Other see comment  Fall precautions discussed Continue medications  Keep chronic follow up Rx Lift Chair faxed to [facility]  Follow up if symptoms worsen or do not improve   Follow Up Instructions: I discussed the assessment and treatment plan with the patient. The patient was provided an opportunity to ask questions and all were answered. The patient agreed with the plan and demonstrated an understanding of the instructions.  A copy of instructions were sent to the patient via MyChart unless otherwise noted below.    The patient was advised to call back or seek an in-person evaluation if the symptoms worsen or if the condition fails to improve as anticipated.  Time:  I spent 18 minutes with the patient via telehealth technology discussing the above problems/concerns.    [name], FNP

## 2021-10-14 NOTE — Progress Notes (Signed)
Order for liftchair faxed to Sheila [name] w/ [facility] at [phone]

## 2021-10-17 ENCOUNTER — Telehealth: Payer: Self-pay | Admitting: Family

## 2021-10-17 NOTE — Telephone Encounter (Signed)
Patient is still having issues with itching, wants to know if anything else can be called in. Please call back.

## 2021-10-17 NOTE — Telephone Encounter (Signed)
Continue Vistaril TID prn. Make sure she has removed all bed bugs

## 2021-10-17 NOTE — Telephone Encounter (Signed)
Patient aware.

## 2021-10-22 ENCOUNTER — Encounter: Payer: Self-pay | Admitting: Internal Medicine

## 2021-10-22 ENCOUNTER — Encounter: Payer: Medicare Other | Admitting: Family Medicine

## 2021-10-22 ENCOUNTER — Ambulatory Visit: Payer: Medicare Other | Attending: Internal Medicine | Admitting: Internal Medicine

## 2021-10-22 VITALS — BP 98/63 | HR 91 | Ht 59.0 in | Wt 236.2 lb

## 2021-10-22 DIAGNOSIS — E782 Mixed hyperlipidemia: Secondary | ICD-10-CM | POA: Diagnosis not present

## 2021-10-22 DIAGNOSIS — I10 Essential (primary) hypertension: Secondary | ICD-10-CM

## 2021-10-22 DIAGNOSIS — I5022 Chronic systolic (congestive) heart failure: Secondary | ICD-10-CM | POA: Diagnosis not present

## 2021-10-22 DIAGNOSIS — I428 Other cardiomyopathies: Secondary | ICD-10-CM | POA: Diagnosis not present

## 2021-10-22 NOTE — Progress Notes (Unsigned)
   Virtual Visit  Note Due to COVID-19 pandemic this visit was conducted virtually. This visit type was conducted due to national recommendations for restrictions regarding the COVID-19 Pandemic (e.g. social distancing, sheltering in place) in an effort to limit this patient's exposure and mitigate transmission in our community. All issues noted in this document were discussed and addressed.  A physical exam was not performed with this format.  I connected with Sheila [name] on 10/22/21 at *** by telephone and verified that I am speaking with the correct person using two identifiers. Sheila [name] is currently located at *** and *** is currently with *** during visit. The provider, Gwenlyn Perking, FNP is located in their office at time of visit.  I discussed the limitations, risks, security and privacy concerns of performing an evaluation and management service by telephone and the availability of in person appointments. I also discussed with the patient that there may be a patient responsible charge related to this service. The patient expressed understanding and agreed to proceed.   History and Present Illness:  HPI    ROS   Observations/Objective: ***  Assessment and Plan: ***  Follow Up Instructions: ***    I discussed the assessment and treatment plan with the patient. The patient was provided an opportunity to ask questions and all were answered. The patient agreed with the plan and demonstrated an understanding of the instructions.   The patient was advised to call back or seek an in-person evaluation if the symptoms worsen or if the condition fails to improve as anticipated.  The above assessment and management plan was discussed with the patient. The patient verbalized understanding of and has agreed to the management plan. Patient is aware to call the clinic if symptoms persist or worsen. Patient is aware when to return to the clinic for a follow-up visit. Patient educated  on when it is appropriate to go to the emergency department.   Time call ended:    I provided *** minutes of  non face-to-face time during this encounter.    Gwenlyn Perking, FNP

## 2021-10-22 NOTE — Patient Instructions (Signed)
Medication Instructions:  NO CHANGES  *If you need a refill on your cardiac medications before your next appointment, please call your pharmacy*   Testing/Procedures: Your physician has requested that you have an echocardiogram due in 6 months at [facility]. Echocardiography is a painless test that uses sound waves to create images of your heart. It provides your doctor with information about the size and shape of your heart and how well your heart's chambers and valves are working. This procedure takes approximately one hour. There are no restrictions for this procedure.    Follow-Up: At [facility], you and your health needs are our priority.  As part of our continuing mission to provide you with exceptional heart care, we have created designated Provider Care Teams.  These Care Teams include your primary Cardiologist (physician) and Advanced Practice Providers (APPs -  Physician Assistants and Nurse Practitioners) who all work together to provide you with the care you need, when you need it.  We recommend signing up for the patient portal called "MyChart".  Sign up information is provided on this After Visit Summary.  MyChart is used to connect with patients for Virtual Visits (Telemedicine).  Patients are able to view lab/test results, encounter notes, upcoming appointments, etc.  Non-urgent messages can be sent to your provider as well.   To learn more about what you can do with MyChart, go to [email or web address].    Your next appointment:   6 month(s)  The format for your next appointment:   In Person  Provider:   Pixie Casino, MD

## 2021-10-22 NOTE — Progress Notes (Signed)
Cardiology Office Note:    Date:  10/22/2021   ID:  [name], DOB 10/11/1940, MRN [id number]  PCP:  Sharion Balloon, FNP  Cardiologist:  Dr. Gwenlyn Found --> pt family wishes to switch providers  Referring MD: Sharion Balloon, FNP   No chief complaint on file.   History of Present Illness:    Sheila [name] is a 81 y.o. female with a hx of GAD on chronic benzodiazepine, chronic back pain, HTN, COPD, hyperparathyroidism, OA, CKD stage III, HLD, MDD, obesity, chronic systolic and diastolic heart failure, NICM with normal coronaries by heart cath 2021.   Echo 09/17/19 with LVEF 20-25% grade 3 DD, moderate MR and TR. Right and left heart cath 10/02/19 with normal coronary arteries. She was admitted and AHF service consulted. She was diuresed using ionotropes and discharged on entresto and spironolactone. Coreg D/C'ed for low cardiac output. On follow up 10/2019, she was somewhat confused. Digoxin level obtained was 1.1 and this was discontinued. She saw Dr. Haroldine Laws in the heart failure clinic who recommended palliative care. She is minimally ambulatory and generally wheelchair-bound with symptoms of chronic heart failure. She last saw Dr. Gwenlyn Found 03/29/20 and was doing a bit better. She had home health and hospice care at that time. She was walking with a walker.   She called our office stating she no longer had help or hospice care coming to the house. She wants to get this reinstated. On presentation today, she presents in a wheelchair. They request palliative care, a bedside commode and something for skin itching. I actually do not think she is extremely volume up. She is taking 49-51 mg entresto, spiro, and 20 mg torsemide daily. She has stopped potassium supplement.   10/22/2021  Sheila [name] is seen today in follow-up.  She has requested transfer of care from Dr. Gwenlyn Found to a new cardiologist and was placed on the DOD schedule today.  She previously saw Angie Duke as above for follow-up.  Unfortunately in  2021 she had significant heart failure which was a nonischemic cardiomyopathy.  It was felt that she had limited therapy options and palliative care was recommended.  She was started on guideline directed medical therapy for heart failure.  Fortunately she had a repeat echo in January 2023 which shows a significant improvement in her LVEF up to 45 to 50%.  She has not had any hospitalizations for heart failure but was admitted this year for UTI/urosepsis at [facility].  She denies any worsening shortness of breath or chest pain.  Blood pressure was noted to be low today however is not clear if this is an outlier.  She does get home health who evaluates that intermittently.   Past Medical History:  Diagnosis Date   Allergy    Bell's palsy    Cancer ([place])    skin CA on face   Cataract    Chest pain 07/2009   Myoveiw stress test negative.   Depression    Diverticulosis of colon    DJD (degenerative joint disease) of knee    Dyspnea    on exertion   Family history of adverse reaction to anesthesia    sister ha nausea/vomiting   Gastric ulcer with hemorrhage 01/14/2011   GERD (gastroesophageal reflux disease)    Headache    History of fractured pelvis    Hypercholesteremia    Hypertension    Hypothyroidism    LV dysfunction    UTI (lower urinary tract infection)  Past Surgical History:  Procedure Laterality Date   APPENDECTOMY     BREAST BIOPSY Left    ESOPHAGOGASTRODUODENOSCOPY  01/14/2011   Procedure: ESOPHAGOGASTRODUODENOSCOPY (EGD);  Surgeon: Rogene Houston, MD;  Location: [facility];  Service: Endoscopy;  Laterality: N/A;   HIP SURGERY     pelvis   JOINT REPLACEMENT     KNEE SURGERY  04/2010.   Total left knee replacement   LAPAROSCOPIC APPENDECTOMY N/A 08/01/2013   Procedure: APPENDECTOMY LAPAROSCOPIC;  Surgeon: Jamesetta So, MD;  Location: [facility] ORS;  Service: General;  Laterality: N/A;   RIGHT/LEFT HEART CATH AND CORONARY ANGIOGRAPHY N/A 10/02/2019   Procedure:  RIGHT/LEFT HEART CATH AND CORONARY ANGIOGRAPHY;  Surgeon: Lorretta Harp, MD;  Location: [place] [facility];  Service: Cardiovascular;  Laterality: N/A;    Current Medications: Current Meds  Medication Sig   albuterol (PROVENTIL) (2.5 MG/3ML) 0.083% nebulizer solution NEBULIZE 1 VIAL EVERY 6 TO 8 HOURS AS NEEDED FOR WHEEZING OR SHORTNESS OF BREATH   ALPRAZolam (XANAX) 0.5 MG tablet Take 1 tablet (0.5 mg total) by mouth 2 (two) times daily as needed.   benzonatate (TESSALON) 100 MG capsule TAKE 1 CAPSULE BY MOUTH THREE TIMES A DAY AS NEEDED FOR COUGH   Budeson-Glycopyrrol-Formoterol (BREZTRI AEROSPHERE) 160-9-4.8 MCG/ACT AERO Inhale 2 puffs into the lungs 2 (two) times daily.   Cholecalciferol (VITAMIN D3) 25 MCG (1000 UT) CAPS Take 1,000 Units by mouth daily.    cinacalcet (SENSIPAR) 30 MG tablet Take 1 tablet (30 mg total) by mouth every other day.   CRANBERRY PO Take 1 tablet by mouth daily.   ENTRESTO 49-51 MG TAKE 1 TABLET 2 TIMES A DAY   furosemide (LASIX) 20 MG tablet    gabapentin (NEURONTIN) 300 MG capsule TAKE ONE CAPSULE THREE TIMES DAILY   hydrOXYzine (VISTARIL) 25 MG capsule TAKE ONE CAPSULE THREE TIMES DAILY AS NEEDED   levocetirizine (XYZAL) 5 MG tablet TAKE ONE TABLET ONCE DAILY EVERY EVENING   levothyroxine (SYNTHROID) 100 MCG tablet Take 1 tablet (100 mcg total) by mouth daily.   LIVALO 4 MG TABS TAKE 1 TABLET DAILY   metoprolol tartrate (LOPRESSOR) 25 MG tablet Take 0.5 tablets (12.5 mg total) by mouth 2 (two) times daily.   nystatin (MYCOSTATIN/NYSTOP) powder Apply 1 Application topically 3 (three) times daily.   nystatin cream (MYCOSTATIN) Apply 1 Application topically 2 (two) times daily.   pantoprazole (PROTONIX) 40 MG tablet Take 1 tablet (40 mg total) by mouth daily.   PROLIA 60 MG/ML SOSY injection Inject into the skin.   sertraline (ZOLOFT) 100 MG tablet TAKE ONE TABLET ONCE DAILY   spironolactone (ALDACTONE) 25 MG tablet TAKE 1 TABLET ONCE DAILY    Tetrahydrozoline HCl (VISINE OP) Place 1 drop into both eyes daily as needed (dry eyes/itching).   torsemide (DEMADEX) 20 MG tablet Take 1 tablet (20 mg total) by mouth 2 (two) times daily. TAKE 2 TABLETS DAILY AS DIRECTED Strength: 20 mg   traMADol (ULTRAM) 50 MG tablet Take 2 tablets (100 mg total) by mouth every 12 (twelve) hours as needed. TAKE TWO TABLETS EVERY TWELVE HOURS AS NEEDED FOR PAIN     Allergies:   Ibuprofen, Benadryl [diphenhydramine hcl], Lipitor [atorvastatin], Baclofen, Hydrocodone, Codeine, Morphine and related, and Tdap [tetanus-diphth-acell pertussis]   Social History   Socioeconomic History   Marital status: Widowed    Spouse name: Not on file   Number of children: 2   Years of education: 0   Highest education level: Never  attended school  Occupational History   Occupation: retired  Tobacco Use   Smoking status: Former    Types: Cigarettes    Quit date: 02/24/1968    Years since quitting: 53.6   Smokeless tobacco: Current    Types: Snuff   Tobacco comments:    quit yrs and yrs ago when she was very young  Media planner   Vaping Use: Never used  Substance and Sexual Activity   Alcohol use: No   Drug use: No   Sexual activity: Not Currently    Birth control/protection: Post-menopausal  Other Topics Concern   Not on file  Social History Narrative   Lives alone, but has 24 hour caregiver, palliative care   Daughter lives nearby and helps her with paying bills, shopping, etc.   Social Determinants of Health   Financial Resource Strain: Low Risk  (12/30/2020)   Overall Financial Resource Strain (CARDIA)    Difficulty of Paying Living Expenses: Not very hard  Food Insecurity: No Food Insecurity (12/30/2020)   Hunger Vital Sign    Worried About Running Out of Food in the Last Year: Never true    [place] in the Last Year: Never true  Transportation Needs: No Transportation Needs (12/30/2020)   PRAPARE - Hydrologist  (Medical): No    Lack of Transportation (Non-Medical): No  Physical Activity: Inactive (12/30/2020)   Exercise Vital Sign    Days of Exercise per Week: 0 days    Minutes of Exercise per Session: 0 min  Stress: No Stress Concern Present (12/30/2020)   [place]    Feeling of Stress : Only a little  Social Connections: Socially Isolated (12/30/2020)   Social Connection and Isolation Panel [NHANES]    Frequency of Communication with Friends and Family: More than three times a week    Frequency of Social Gatherings with Friends and Family: More than three times a week    Attends Religious Services: Never    Marine scientist or Organizations: No    Attends Archivist Meetings: Never    Marital Status: Widowed     Family History: The patient's family history includes Alzheimer's disease in her sister; Aneurysm in her mother; Cancer in her brother, brother, father, and sister; Heart disease in her sister; Hip fracture in her sister; Hypertension in her mother; Stroke in her mother.  ROS:   Please see the history of present illness.     All other systems reviewed and are negative.  EKGs/Labs/Other Studies Reviewed:    The following studies were reviewed today:  R/L heart cath 10/02/19: IMPRESSION: Ms. Magno has severe nonischemic cardiomyopathy with incidentally noted ostial/proximal first diagonal branch lesion and a small to medium sized vessel. I think this is true/true and unrelated and probably does not require intervention. Given her severe desaturation and markedly depressed cardiac index we will keep her in the hospital on the heart failure service for guideline directed optimal medical therapy/optimization. She is already on Coreg and would benefit from additional diuretics and initiation of Entresto. The sheaths were removed and a TR band was placed on the right wrist to achieve patent hemostasis. The  patient left lab in stable condition. I have communicated her clinical status to Dr. Haroldine Laws who service she will be on.  EKG: Normal sinus rhythm 91, low voltage QRS, nonspecific ST and T wave changes-personally reviewed  Recent Labs: 03/06/2021: B Natriuretic  Peptide 21.0 07/14/2021: Hemoglobin 13.8; Platelets 387 09/19/2021: ALT 22; BUN 47; Creatinine, Ser 1.32; Potassium 4.5; Sodium 140; TSH 5.120  Recent Lipid Panel    Component Value Date/Time   CHOL 163 07/26/2019 1454   CHOL 154 07/28/2012 1248   TRIG 200 (H) 07/26/2019 1454   TRIG 142 05/11/2014 1559   TRIG 120 07/28/2012 1248   HDL 43 07/26/2019 1454   HDL 45 05/11/2014 1559   HDL 44 07/28/2012 1248   CHOLHDL 3.8 07/26/2019 1454   LDLCALC 86 07/26/2019 1454   LDLCALC 48 11/15/2012 1255   LDLCALC 86 07/28/2012 1248    Physical Exam:    VS:  BP 98/63   Pulse 91   Ht '4\' 11"'$  (1.499 m)   Wt 236 lb 3.2 oz (107.1 kg)   SpO2 97%   BMI 47.71 kg/m     Wt Readings from Last 3 Encounters:  10/22/21 236 lb 3.2 oz (107.1 kg)  09/19/21 237 lb 9.6 oz (107.8 kg)  07/29/21 231 lb (104.8 kg)     GEN: morbidly obese female in NAD, in wheelchair HEENT: Normal NECK: No JVD; No carotid bruits LYMPHATICS: No lymphadenopathy CARDIAC: RRR, no murmurs, rubs, gallops RESPIRATORY:  Clear to auscultation without rales, wheezing or rhonchi  ABDOMEN: Soft, non-tender, non-distended MUSCULOSKELETAL:  No edema; No deformity  SKIN: Warm and dry NEUROLOGIC:  Alert and oriented x 3 PSYCHIATRIC:  Normal affect   ASSESSMENT:    1. NICM (nonischemic cardiomyopathy) ([place])    PLAN:    In order of problems listed above:  NICM Chronic systolic and diastolic heart failure LVEF 20-25%, grade 3 DD - > improved to 45 to 50% (02/2021) Normal coronaries by heart cath Maintained on entresto, metoprolol, torsemide and aldactone Not a candidate for SGLT2 inhibitor given recent UTIs/urosepsis She was placed on palliative care  - seen  intermittently, gets home health Appears euvolemic on exam today. Will repeat echo in 6 months at [facility]  Hypertension Borderline hypotensive today- advised to monitor with home health. May need to reduce meds if her BP remains in the 90's.  Hyperlipidemia Followed by her PCP  Follow up in 6 months.   Medication Adjustments/Labs and Tests Ordered: Current medicines are reviewed at length with the patient today.  Concerns regarding medicines are outlined above.  Orders Placed This Encounter  Procedures   EKG 12-Lead   ECHOCARDIOGRAM COMPLETE   No orders of the defined types were placed in this encounter.  Pixie Casino, MD, [facility], [place] Director of the Advanced Lipid Disorders &  Cardiovascular Risk Reduction Clinic Diplomate of the American Board of Clinical Lipidology Attending Cardiologist  Direct Dial: [phone]  Fax: [phone]  Website:  [email or web address]

## 2021-10-23 ENCOUNTER — Encounter: Payer: Self-pay | Admitting: Family

## 2021-10-23 ENCOUNTER — Telehealth: Payer: Medicare Other

## 2021-10-23 ENCOUNTER — Ambulatory Visit (INDEPENDENT_AMBULATORY_CARE_PROVIDER_SITE_OTHER): Payer: Medicare Other | Admitting: Family

## 2021-10-23 DIAGNOSIS — L282 Other prurigo: Secondary | ICD-10-CM

## 2021-10-23 DIAGNOSIS — B372 Candidiasis of skin and nail: Secondary | ICD-10-CM | POA: Diagnosis not present

## 2021-10-23 MED ORDER — NYSTATIN 100000 UNIT/GM EX POWD: 1.0000 | Freq: Three times a day (TID) | CUTANEOUS | 2 refills | Status: DC

## 2021-10-23 MED ORDER — KETOCONAZOLE 2 % EX CREA: 1.0000 | TOPICAL_CREAM | Freq: Every day | CUTANEOUS | 0 refills | Status: DC

## 2021-10-23 MED ORDER — FLUCONAZOLE 150 MG PO TABS: 150.0000 mg | ORAL_TABLET | ORAL | 0 refills | Status: DC | PRN

## 2021-10-23 NOTE — Progress Notes (Signed)
Virtual Visit  Note Due to COVID-19 pandemic this visit was conducted virtually. This visit type was conducted due to national recommendations for restrictions regarding the COVID-19 Pandemic (e.g. social distancing, sheltering in place) in an effort to limit this patient's exposure and mitigate transmission in our community. All issues noted in this document were discussed and addressed.  A physical exam was not performed with this format.  I connected with Sheila [name] on 10/23/21 at 2:23 pm by telephone and verified that I am speaking with the correct person using two identifiers. Sheila [name] is currently located at home and nursing aid is currently with her during visit. The provider, [name], FNP is located in their office at time of visit.  I discussed the limitations, risks, security and privacy concerns of performing an evaluation and management service by telephone and the availability of in person appointments. I also discussed with the patient that there may be a patient responsible charge related to this service. The patient expressed understanding and agreed to proceed.  Ms. Sheila [name], dils are scheduled for a virtual visit with your provider today.    Just as we do with appointments in the office, we must obtain your consent to participate.  Your consent will be active for this visit and any virtual visit you may have with one of our providers in the next 365 days.    If you have a MyChart account, I can also send a copy of this consent to you electronically.  All virtual visits are billed to your insurance company just like a traditional visit in the office.  As this is a virtual visit, video technology does not allow for your provider to perform a traditional examination.  This may limit your provider's ability to fully assess your condition.  If your provider identifies any concerns that need to be evaluated in person or the need to arrange testing such as labs, EKG, etc, we will make  arrangements to do so.    Although advances in technology are sophisticated, we cannot ensure that it will always work on either your end or our end.  If the connection with a video visit is poor, we may have to switch to a telephone visit.  With either a video or telephone visit, we are not always able to ensure that we have a secure connection.   I need to obtain your verbal consent now.   Are you willing to proceed with your visit today?   Sheila [name] has provided verbal consent on 10/23/2021 for a virtual visit (video or telephone).   [name], [place] 10/23/2021  2:36 PM   History and Present Illness:  PT calls the office with pruritis. She did have bedbugs, but states she was cleared.   Rash This is a recurrent problem. The current episode started more than 1 month ago. The problem has been waxing and waning since onset. The affected locations include the back, abdomen, left arm, right arm, right lower leg and right upper leg. The rash is characterized by redness and itchiness. Pertinent negatives include no fatigue, shortness of breath or sore throat. Past treatments include topical steroids, antihistamine and anti-itch cream. The treatment provided mild relief.      Review of Systems  Constitutional:  Negative for fatigue.  HENT:  Negative for sore throat.   Respiratory:  Negative for shortness of breath.   Skin:  Positive for rash.     Observations/Objective: No SOB or distress noted  Assessment and Plan: 1. Pruritic rash Encourage writing down all triggers - Ambulatory referral to Dermatology  2. Candidal skin infection Keep clean and dry Avoid scratching - fluconazole (DIFLUCAN) 150 MG tablet; Take 1 tablet (150 mg total) by mouth every three (3) days as needed.  Dispense: 7 tablet; Refill: 0 - nystatin (MYCOSTATIN/NYSTOP) powder; Apply 1 Application topically 3 (three) times daily.  Dispense: 45 g; Refill: 2 - ketoconazole (NIZORAL) 2 % cream; Apply 1 Application  topically daily.  Dispense: 15 g; Refill: 0 - Ambulatory referral to Dermatology  - She states her house was cleared from bedbugs. Encouraged to continue to monitor for these.    I discussed the assessment and treatment plan with the patient. The patient was provided an opportunity to ask questions and all were answered. The patient agreed with the plan and demonstrated an understanding of the instructions.   The patient was advised to call back or seek an in-person evaluation if the symptoms worsen or if the condition fails to improve as anticipated.  The above assessment and management plan was discussed with the patient. The patient verbalized understanding of and has agreed to the management plan. Patient is aware to call the clinic if symptoms persist or worsen. Patient is aware when to return to the clinic for a follow-up visit. Patient educated on when it is appropriate to go to the emergency department.   Time call ended:  2:36 pm   I provided 13 minutes of  non face-to-face time during this encounter.    [name], FNP

## 2021-10-23 NOTE — Progress Notes (Signed)
Patient rescheduled

## 2021-10-28 ENCOUNTER — Other Ambulatory Visit: Payer: Self-pay | Admitting: Family

## 2021-11-04 ENCOUNTER — Other Ambulatory Visit: Payer: Self-pay | Admitting: Family

## 2021-11-04 DIAGNOSIS — G8929 Other chronic pain: Secondary | ICD-10-CM

## 2021-11-04 DIAGNOSIS — M159 Polyosteoarthritis, unspecified: Secondary | ICD-10-CM

## 2021-11-04 DIAGNOSIS — M150 Primary generalized (osteo)arthritis: Secondary | ICD-10-CM

## 2021-11-04 DIAGNOSIS — B372 Candidiasis of skin and nail: Secondary | ICD-10-CM

## 2021-11-04 DIAGNOSIS — Z79899 Other long term (current) drug therapy: Secondary | ICD-10-CM

## 2021-11-07 ENCOUNTER — Ambulatory Visit (INDEPENDENT_AMBULATORY_CARE_PROVIDER_SITE_OTHER): Payer: Medicare Other | Admitting: Family

## 2021-11-07 ENCOUNTER — Encounter: Payer: Self-pay | Admitting: Family

## 2021-11-07 ENCOUNTER — Telehealth: Payer: Self-pay | Admitting: Family Medicine

## 2021-11-07 VITALS — BP 124/89 | HR 87 | Temp 97.2°F | Ht 59.0 in | Wt 234.0 lb

## 2021-11-07 DIAGNOSIS — M19011 Primary osteoarthritis, right shoulder: Secondary | ICD-10-CM

## 2021-11-07 DIAGNOSIS — G629 Polyneuropathy, unspecified: Secondary | ICD-10-CM

## 2021-11-07 DIAGNOSIS — G8929 Other chronic pain: Secondary | ICD-10-CM

## 2021-11-07 DIAGNOSIS — I10 Essential (primary) hypertension: Secondary | ICD-10-CM | POA: Diagnosis not present

## 2021-11-07 DIAGNOSIS — M549 Dorsalgia, unspecified: Secondary | ICD-10-CM

## 2021-11-07 DIAGNOSIS — F1320 Sedative, hypnotic or anxiolytic dependence, uncomplicated: Secondary | ICD-10-CM | POA: Diagnosis not present

## 2021-11-07 DIAGNOSIS — Z0289 Encounter for other administrative examinations: Secondary | ICD-10-CM

## 2021-11-07 DIAGNOSIS — I5022 Chronic systolic (congestive) heart failure: Secondary | ICD-10-CM

## 2021-11-07 DIAGNOSIS — N1832 Chronic kidney disease, stage 3b: Secondary | ICD-10-CM

## 2021-11-07 DIAGNOSIS — F331 Major depressive disorder, recurrent, moderate: Secondary | ICD-10-CM

## 2021-11-07 DIAGNOSIS — M159 Polyosteoarthritis, unspecified: Secondary | ICD-10-CM

## 2021-11-07 DIAGNOSIS — E039 Hypothyroidism, unspecified: Secondary | ICD-10-CM | POA: Diagnosis not present

## 2021-11-07 DIAGNOSIS — J449 Chronic obstructive pulmonary disease, unspecified: Secondary | ICD-10-CM

## 2021-11-07 DIAGNOSIS — F411 Generalized anxiety disorder: Secondary | ICD-10-CM

## 2021-11-07 DIAGNOSIS — Z23 Encounter for immunization: Secondary | ICD-10-CM

## 2021-11-07 DIAGNOSIS — E210 Primary hyperparathyroidism: Secondary | ICD-10-CM

## 2021-11-07 DIAGNOSIS — M1711 Unilateral primary osteoarthritis, right knee: Secondary | ICD-10-CM | POA: Diagnosis not present

## 2021-11-07 DIAGNOSIS — E782 Mixed hyperlipidemia: Secondary | ICD-10-CM

## 2021-11-07 DIAGNOSIS — Z79899 Other long term (current) drug therapy: Secondary | ICD-10-CM

## 2021-11-07 DIAGNOSIS — B372 Candidiasis of skin and nail: Secondary | ICD-10-CM

## 2021-11-07 MED ORDER — TRAMADOL HCL 50 MG PO TABS: 100.0000 mg | ORAL_TABLET | Freq: Two times a day (BID) | ORAL | 2 refills | Status: DC | PRN

## 2021-11-07 MED ORDER — ALPRAZOLAM 0.5 MG PO TABS: 0.5000 mg | ORAL_TABLET | Freq: Two times a day (BID) | ORAL | 2 refills | Status: DC | PRN

## 2021-11-07 MED ORDER — NYSTATIN 100000 UNIT/GM EX POWD: 1.0000 | Freq: Three times a day (TID) | CUTANEOUS | 2 refills | Status: DC

## 2021-11-07 NOTE — Progress Notes (Signed)
Subjective:    Sheila [name] ID: Sheila [name], female    DOB: [birth year], 81 y.o.   MRN: [id number]  Chief Complaint  Sheila [name] presents with   Medical Management of Chronic Issues   Gastroesophageal Reflux   Pt presents to the office today for chronic follow up. Sheila [name] is followed by Cardiologists every 3 months for CAD and CHF. Sheila [name] is currently  has an aid and family that stays with her 24/7.   Sheila [name] reports Sheila [name] is having a hard time walking long distances. Sheila [name] has to use a rolling walker at all times, however, Sheila [name] can not walk more than a few feet without having to relax.    Sheila [name] saw an Endocrinologists for  hypercalcemia & hyperparathyroidism. Sheila [name] is followed by Dermatologists for skin lesion.    Sheila [name] has COPD and states her breathing is stable as long as Sheila [name] it sitting and does not have to wear a mask. States Sheila [name] gets SOB with exertion.    Sheila [name] has CKD and tries to limit NSAID's. Gastroesophageal Reflux Sheila [name] complains of belching, heartburn and a hoarse voice. Sheila [name] reports no chest pain, no choking or no coughing. This is a chronic problem. The current episode started more than 1 year ago. The problem occurs occasionally. Pertinent negatives include no fatigue. Risk factors include obesity. Sheila [name] has tried a PPI for the symptoms. The treatment provided mild relief.  Hypertension This is a chronic problem. The current episode started more than 1 year ago. The problem has been resolved since onset. The problem is controlled. Associated symptoms include anxiety and shortness of breath. Pertinent negatives include no chest pain, malaise/fatigue or peripheral edema. Risk factors for coronary artery disease include dyslipidemia, obesity and sedentary lifestyle. The current treatment provides moderate improvement. Hypertensive end-organ damage includes heart failure. Identifiable causes of hypertension include a thyroid problem.  Congestive Heart Failure Presents for follow-up visit. Associated symptoms include  shortness of breath. Pertinent negatives include no chest pain, chest pressure, edema or fatigue. The symptoms have been stable.  Thyroid Problem Presents for follow-up visit. Symptoms include anxiety, depressed mood, dry skin and hoarse voice. Sheila [name] reports no fatigue. The symptoms have been stable. Her past medical history is significant for heart failure and hyperlipidemia.  Arthritis Presents for follow-up visit. Sheila [name] complains of pain and stiffness. The symptoms have been stable. Affected locations include the left knee, right knee, left MCP and right MCP. Her pain is at a severity of 8/10. Pertinent negatives include no fatigue.  Hyperlipidemia This is a chronic problem. The current episode started more than 1 year ago. The problem is controlled. Recent lipid tests were reviewed and are normal. Exacerbating diseases include obesity. Associated symptoms include shortness of breath. Pertinent negatives include no chest pain. Current antihyperlipidemic treatment includes statins. The current treatment provides moderate improvement of lipids. Risk factors for coronary artery disease include dyslipidemia, hypertension, a sedentary lifestyle and post-menopausal.  Anxiety Presents for follow-up visit. Symptoms include depressed mood, excessive worry, irritability, nervous/anxious behavior and shortness of breath. Sheila [name] reports no chest pain. Symptoms occur occasionally. The severity of symptoms is moderate.    Depression        This is a chronic problem.  The current episode started more than 1 year ago.   The onset quality is gradual.   The problem occurs intermittently.  Associated symptoms include no fatigue, no helplessness, no hopelessness and not sad.  Past medical history includes thyroid problem and anxiety.   Back Pain This is a chronic  problem. The current episode started more than 1 year ago. The problem occurs intermittently. The problem has been waxing and waning since onset. The pain is  present in the lumbar spine. The pain is moderate. Pertinent negatives include no chest pain.   Current opioids rx- Ultram 50 mg #45 and Xanax 0.5 mg #60 Effectiveness of current meds-stable Adverse reactions from pain meds-none Morphine equivalent- 20   Pill count performed-No Last drug screen - 05/15/21 ( high risk q74m moderate risk q667mlow risk yearly ) Urine drug screen today- No [place]- Yes             If yes were their any concerning findings? - none   Pain contract signed on: 04/25/21   Review of Systems  Constitutional:  Positive for irritability. Negative for fatigue and malaise/fatigue.  HENT:  Positive for hoarse voice.   Respiratory:  Positive for shortness of breath. Negative for cough and choking.   Cardiovascular:  Negative for chest pain.  Gastrointestinal:  Positive for heartburn.  Musculoskeletal:  Positive for arthritis, back pain and stiffness.  Psychiatric/Behavioral:  Positive for depression. The Sheila [name] is nervous/anxious.   All other systems reviewed and are negative.      Objective:   Physical Exam Vitals reviewed.  Constitutional:      General: Sheila [name] is not in acute distress.    Appearance: Sheila [name] is well-developed. Sheila [name] is obese.  HENT:     Head: Normocephalic and atraumatic.     Right Ear: Tympanic membrane normal.     Left Ear: Tympanic membrane normal.  Eyes:     Pupils: Pupils are equal, round, and reactive to light.  Neck:     Thyroid: No thyromegaly.  Cardiovascular:     Rate and Rhythm: Normal rate and regular rhythm.     Heart sounds: Normal heart sounds. No murmur heard. Pulmonary:     Effort: Pulmonary effort is normal. No respiratory distress.     Breath sounds: Normal breath sounds. No wheezing.  Abdominal:     General: Bowel sounds are normal. There is no distension.     Palpations: Abdomen is soft.     Tenderness: There is no abdominal tenderness.  Musculoskeletal:        General: Tenderness (pain in lumbar and  bilateral knees) present. Normal range of motion.     Cervical back: Normal range of motion and neck supple.  Skin:    General: Skin is warm and dry.  Neurological:     Mental Status: Sheila [name] is alert and oriented to person, place, and time.     Cranial Nerves: No cranial nerve deficit.     Deep Tendon Reflexes: Reflexes are normal and symmetric.  Psychiatric:        Behavior: Behavior normal.        Thought Content: Thought content normal.        Judgment: Judgment normal.      BP 124/89   Pulse 87   Temp (!) 97.2 F (36.2 C) (Temporal)   Ht 4' 11" (1.499 m)   Wt 234 lb (106.1 kg)   SpO2 96%   BMI 47.26 kg/m       Assessment & Plan:   ElAVERLY ERICSONomes in today with chief complaint of Medical Management of Chronic Issues and Gastroesophageal Reflux   Diagnosis and orders addressed:  1. Controlled substance agreement signed - ALPRAZolam (XANAX) 0.5 MG tablet; Take 1 tablet (0.5 mg total) by mouth 2 (two) times  daily as needed.  Dispense: 60 tablet; Refill: 2 - traMADol (ULTRAM) 50 MG tablet; Take 2 tablets (100 mg total) by mouth every 12 (twelve) hours as needed. TAKE TWO TABLETS EVERY TWELVE HOURS AS NEEDED FOR PAIN  Dispense: 45 tablet; Refill: 2 - CMP14+EGFR - CBC with Differential/Platelet  2. GAD (generalized anxiety disorder) - ALPRAZolam (XANAX) 0.5 MG tablet; Take 1 tablet (0.5 mg total) by mouth 2 (two) times daily as needed.  Dispense: 60 tablet; Refill: 2 - CMP14+EGFR - CBC with Differential/Platelet  3. Benzodiazepine dependence (HCC) - ALPRAZolam (XANAX) 0.5 MG tablet; Take 1 tablet (0.5 mg total) by mouth 2 (two) times daily as needed.  Dispense: 60 tablet; Refill: 2 - CMP14+EGFR - CBC with Differential/Platelet  4. Chronic bilateral back pain, unspecified back location - traMADol (ULTRAM) 50 MG tablet; Take 2 tablets (100 mg total) by mouth every 12 (twelve) hours as needed. TAKE TWO TABLETS EVERY TWELVE HOURS AS NEEDED FOR PAIN  Dispense: 45 tablet;  Refill: 2 - CMP14+EGFR - CBC with Differential/Platelet - For home use only DME 4 wheeled rolling walker with seat ([id number])  5. Primary osteoarthritis involving multiple joints - traMADol (ULTRAM) 50 MG tablet; Take 2 tablets (100 mg total) by mouth every 12 (twelve) hours as needed. TAKE TWO TABLETS EVERY TWELVE HOURS AS NEEDED FOR PAIN  Dispense: 45 tablet; Refill: 2 - CMP14+EGFR - CBC with Differential/Platelet - For home use only DME 4 wheeled rolling walker with seat ([id number])  6. Need for immunization against influenza - Flu Vaccine QUAD High Dose(Fluad) - CMP14+EGFR - CBC with Differential/Platelet  7. Chronic systolic CHF (congestive heart failure) (HCC) - CMP14+EGFR - CBC with Differential/Platelet - For home use only DME 4 wheeled rolling walker with seat ([id number])  8. Essential hypertension, benign - CMP14+EGFR - CBC with Differential/Platelet  9. Chronic obstructive pulmonary disease, unspecified COPD type ([place])  - CMP14+EGFR - CBC with Differential/Platelet - For home use only DME 4 wheeled rolling walker with seat ([id number])  10. Hypothyroidism, unspecified type - CMP14+EGFR - TSH - CBC with Differential/Platelet  11. Neuropathy - CMP14+EGFR - CBC with Differential/Platelet  12. Unilateral primary osteoarthritis, right knee  - CMP14+EGFR - CBC with Differential/Platelet  13. Primary osteoarthritis of right shoulder - CMP14+EGFR - CBC with Differential/Platelet - For home use only DME 4 wheeled rolling walker with seat ([id number])  14. Stage 3b chronic kidney disease (HCC) - CMP14+EGFR - CBC with Differential/Platelet  15. Mixed hyperlipidemia  - CMP14+EGFR - CBC with Differential/Platelet  16. Morbid obesity ([place]) - CMP14+EGFR - CBC with Differential/Platelet - For home use only DME 4 wheeled rolling walker with seat ([id number])  17. Pain medication agreement signed - CMP14+EGFR - CBC with Differential/Platelet  18. Moderate episode  of recurrent major depressive disorder (HCC) - CMP14+EGFR - CBC with Differential/Platelet  19. Primary hyperparathyroidism (HCC) - CMP14+EGFR - CBC with Differential/Platelet  20. Candidal skin infection - nystatin (MYCOSTATIN/NYSTOP) powder; Apply 1 Application topically 3 (three) times daily.  Dispense: 90 g; Refill: 2   Labs pending Sheila [name] reviewed in [place] controlled database, no flags noted. Contract and drug screen are up to date.  Health Maintenance reviewed Diet and exercise encouraged  Follow up plan: 3 months    [name], FNP

## 2021-11-07 NOTE — Telephone Encounter (Signed)
Lmtcb.

## 2021-11-07 NOTE — Telephone Encounter (Signed)
PT stated she would try Protonix 40 mg in AM and if that doesn't work we switch to 20 mg BID.  Avoid fried, spicy, citrus foods, caffeine and alcohol -Do not eat 2-3 hours before bedtime -Encouraged small frequent meals -Avoid NSAID's

## 2021-11-07 NOTE — Patient Instructions (Signed)

## 2021-11-08 LAB — CMP14+EGFR
ALT: 35 IU/L — ABNORMAL HIGH (ref 0–32)
AST: 44 IU/L — ABNORMAL HIGH (ref 0–40)
Albumin/Globulin Ratio: 1.6 (ref 1.2–2.2)
Albumin: 4.9 g/dL — ABNORMAL HIGH (ref 3.8–4.8)
Alkaline Phosphatase: 63 IU/L (ref 44–121)
BUN/Creatinine Ratio: 29 — ABNORMAL HIGH (ref 12–28)
BUN: 61 mg/dL — ABNORMAL HIGH (ref 8–27)
Bilirubin Total: 0.5 mg/dL (ref 0.0–1.2)
CO2: 17 mmol/L — ABNORMAL LOW (ref 20–29)
Calcium: 11 mg/dL — ABNORMAL HIGH (ref 8.7–10.3)
Chloride: 95 mmol/L — ABNORMAL LOW (ref 96–106)
Creatinine, Ser: 2.08 mg/dL — ABNORMAL HIGH (ref 0.57–1.00)
Globulin, Total: 3 g/dL (ref 1.5–4.5)
Glucose: 153 mg/dL — ABNORMAL HIGH (ref 70–99)
Potassium: 5.1 mmol/L (ref 3.5–5.2)
Sodium: 134 mmol/L (ref 134–144)
Total Protein: 7.9 g/dL (ref 6.0–8.5)
eGFR: 24 mL/min/{1.73_m2} — ABNORMAL LOW (ref >59)

## 2021-11-08 LAB — CBC WITH DIFFERENTIAL/PLATELET
Basophils Absolute: 0.1 10*3/uL (ref 0.0–0.2)
Basos: 1 %
EOS (ABSOLUTE): 0.3 10*3/uL (ref 0.0–0.4)
Eos: 3 %
Hematocrit: 40.2 % (ref 34.0–46.6)
Hemoglobin: 13.6 g/dL (ref 11.1–15.9)
Immature Grans (Abs): 0.1 10*3/uL (ref 0.0–0.1)
Immature Granulocytes: 1 %
Lymphocytes Absolute: 4 10*3/uL — ABNORMAL HIGH (ref 0.7–3.1)
Lymphs: 31 %
MCH: 28.5 pg (ref 26.6–33.0)
MCHC: 33.8 g/dL (ref 31.5–35.7)
MCV: 84 fL (ref 79–97)
Monocytes Absolute: 0.9 10*3/uL (ref 0.1–0.9)
Monocytes: 7 %
Neutrophils Absolute: 7.6 10*3/uL — ABNORMAL HIGH (ref 1.4–7.0)
Neutrophils: 57 %
Platelets: 321 10*3/uL (ref 150–450)
RBC: 4.78 x10E6/uL (ref 3.77–5.28)
RDW: 14.4 % (ref 11.7–15.4)
WBC: 13 10*3/uL — ABNORMAL HIGH (ref 3.4–10.8)

## 2021-11-08 LAB — TSH: TSH: 3.06 u[IU]/mL (ref 0.450–4.500)

## 2021-11-10 ENCOUNTER — Other Ambulatory Visit: Payer: Self-pay | Admitting: Family

## 2021-11-11 ENCOUNTER — Telehealth: Payer: Self-pay | Admitting: *Deleted

## 2021-11-11 NOTE — Telephone Encounter (Signed)
Rcvd fax from [place]: rollator Note: We Currently don't offer a rollator that can be billed to insurance. Pt is on the CAPS program w/ [employer], faxed order for the 4 wheeled rolling walker w/ seat & copy of fax from [place] to [employer] ATTN: Vickie Corum to see if she can help patient find where she can get this.

## 2021-11-12 ENCOUNTER — Other Ambulatory Visit: Payer: Self-pay | Admitting: Family Medicine

## 2021-11-12 ENCOUNTER — Other Ambulatory Visit: Payer: Medicare Other

## 2021-11-12 DIAGNOSIS — R7989 Other specified abnormal findings of blood chemistry: Secondary | ICD-10-CM

## 2021-11-13 ENCOUNTER — Other Ambulatory Visit: Payer: Self-pay | Admitting: Family

## 2021-11-13 LAB — CMP14+EGFR
ALT: 30 IU/L (ref 0–32)
AST: 34 IU/L (ref 0–40)
Albumin/Globulin Ratio: 1.6 (ref 1.2–2.2)
Albumin: 4.7 g/dL (ref 3.8–4.8)
Alkaline Phosphatase: 52 IU/L (ref 44–121)
BUN/Creatinine Ratio: 33 — ABNORMAL HIGH (ref 12–28)
BUN: 63 mg/dL — ABNORMAL HIGH (ref 8–27)
Bilirubin Total: 0.5 mg/dL (ref 0.0–1.2)
CO2: 18 mmol/L — ABNORMAL LOW (ref 20–29)
Calcium: 10.2 mg/dL (ref 8.7–10.3)
Chloride: 96 mmol/L (ref 96–106)
Creatinine, Ser: 1.91 mg/dL — ABNORMAL HIGH (ref 0.57–1.00)
Globulin, Total: 3 g/dL (ref 1.5–4.5)
Glucose: 141 mg/dL — ABNORMAL HIGH (ref 70–99)
Potassium: 4.6 mmol/L (ref 3.5–5.2)
Sodium: 137 mmol/L (ref 134–144)
Total Protein: 7.7 g/dL (ref 6.0–8.5)
eGFR: 26 mL/min/{1.73_m2} — ABNORMAL LOW (ref >59)

## 2021-11-19 ENCOUNTER — Ambulatory Visit: Payer: Medicare Other | Admitting: Nurse Practitioner

## 2021-11-21 ENCOUNTER — Ambulatory Visit: Payer: Medicare Other | Admitting: Family

## 2021-11-24 DIAGNOSIS — L304 Erythema intertrigo: Secondary | ICD-10-CM | POA: Diagnosis not present

## 2021-11-24 DIAGNOSIS — L853 Xerosis cutis: Secondary | ICD-10-CM | POA: Diagnosis not present

## 2021-11-24 DIAGNOSIS — L308 Other specified dermatitis: Secondary | ICD-10-CM | POA: Diagnosis not present

## 2021-11-26 ENCOUNTER — Other Ambulatory Visit: Payer: Self-pay | Admitting: Family

## 2021-11-26 DIAGNOSIS — L282 Other prurigo: Secondary | ICD-10-CM

## 2021-11-28 ENCOUNTER — Encounter: Payer: Self-pay | Admitting: Family

## 2021-11-28 ENCOUNTER — Ambulatory Visit (INDEPENDENT_AMBULATORY_CARE_PROVIDER_SITE_OTHER): Payer: Medicare Other | Admitting: Family

## 2021-11-28 VITALS — BP 138/75 | HR 81 | Temp 97.0°F | Ht 59.0 in | Wt 234.0 lb

## 2021-11-28 DIAGNOSIS — N1832 Chronic kidney disease, stage 3b: Secondary | ICD-10-CM | POA: Diagnosis not present

## 2021-11-28 DIAGNOSIS — I10 Essential (primary) hypertension: Secondary | ICD-10-CM | POA: Diagnosis not present

## 2021-11-28 DIAGNOSIS — N179 Acute kidney failure, unspecified: Secondary | ICD-10-CM

## 2021-11-28 DIAGNOSIS — K219 Gastro-esophageal reflux disease without esophagitis: Secondary | ICD-10-CM

## 2021-11-28 LAB — CMP14+EGFR
ALT: 29 IU/L (ref 0–32)
AST: 34 IU/L (ref 0–40)
Albumin/Globulin Ratio: 1.6 (ref 1.2–2.2)
Albumin: 4.6 g/dL (ref 3.8–4.8)
Alkaline Phosphatase: 53 IU/L (ref 44–121)
BUN/Creatinine Ratio: 20 (ref 12–28)
BUN: 30 mg/dL — ABNORMAL HIGH (ref 8–27)
Bilirubin Total: 0.5 mg/dL (ref 0.0–1.2)
CO2: 18 mmol/L — ABNORMAL LOW (ref 20–29)
Calcium: 9.9 mg/dL (ref 8.7–10.3)
Chloride: 100 mmol/L (ref 96–106)
Creatinine, Ser: 1.51 mg/dL — ABNORMAL HIGH (ref 0.57–1.00)
Globulin, Total: 2.9 g/dL (ref 1.5–4.5)
Glucose: 128 mg/dL — ABNORMAL HIGH (ref 70–99)
Potassium: 4.4 mmol/L (ref 3.5–5.2)
Sodium: 139 mmol/L (ref 134–144)
Total Protein: 7.5 g/dL (ref 6.0–8.5)
eGFR: 35 mL/min/{1.73_m2} — ABNORMAL LOW (ref >59)

## 2021-11-28 MED ORDER — PANTOPRAZOLE SODIUM 20 MG PO TBEC: 20.0000 mg | DELAYED_RELEASE_TABLET | Freq: Two times a day (BID) | ORAL | 2 refills | Status: DC

## 2021-11-28 NOTE — Progress Notes (Signed)
Subjective:    Patient ID: Sheila [name], female    DOB: 12-30-1940, 81 y.o.   MRN: [id number]  Chief Complaint  Patient presents with   Hypothyroidism    2 mth rck    PT presents to the office today to recheck creatinine. She was seen on 11/07/21 and her Creatinine was bumped to 2.08. She as taking Lasix and Demadex together. She stopped the lasix and rechecked her labs on 11/12/21 and her creatinine improved to 1.91.  She then decreased her demedex to 20 mg daily from BID.   Her weight is stable.      11/28/2021   10:26 AM 11/07/2021    8:00 AM 10/22/2021    3:52 PM  Last 3 Weights  Weight (lbs) 234 lb 234 lb 236 lb 3.2 oz  Weight (kg) 106.142 kg 106.142 kg 107.14 kg     Gastroesophageal Reflux She complains of belching, heartburn and a hoarse voice. This is a chronic problem. The current episode started more than 1 year ago. The problem occurs occasionally. Risk factors include obesity. She has tried a PPI for the symptoms. The treatment provided moderate relief.  Hypertension This is a chronic problem. The current episode started more than 1 year ago. The problem has been resolved since onset. The problem is controlled. Associated symptoms include malaise/fatigue. Pertinent negatives include no peripheral edema or shortness of breath. Risk factors for coronary artery disease include dyslipidemia, obesity and sedentary lifestyle. The current treatment provides moderate improvement.      Review of Systems  Constitutional:  Positive for malaise/fatigue.  HENT:  Positive for hoarse voice.   Respiratory:  Negative for shortness of breath.   Gastrointestinal:  Positive for heartburn.  All other systems reviewed and are negative.      Objective:   Physical Exam Vitals reviewed.  Constitutional:      General: She is not in acute distress.    Appearance: She is well-developed.  HENT:     Head: Normocephalic and atraumatic.     Right Ear: Tympanic membrane normal.     Left  Ear: Tympanic membrane normal.  Eyes:     Pupils: Pupils are equal, round, and reactive to light.  Neck:     Thyroid: No thyromegaly.  Cardiovascular:     Rate and Rhythm: Normal rate and regular rhythm.     Heart sounds: Normal heart sounds. No murmur heard. Pulmonary:     Effort: Pulmonary effort is normal. No respiratory distress.     Breath sounds: Normal breath sounds. No wheezing.  Abdominal:     General: Bowel sounds are normal. There is no distension.     Palpations: Abdomen is soft.     Tenderness: There is no abdominal tenderness.  Musculoskeletal:        General: No tenderness. Normal range of motion.     Cervical back: Normal range of motion and neck supple.  Skin:    General: Skin is warm and dry.  Neurological:     Mental Status: She is alert and oriented to person, place, and time.     Cranial Nerves: No cranial nerve deficit.     Deep Tendon Reflexes: Reflexes are normal and symmetric.  Psychiatric:        Behavior: Behavior normal.        Thought Content: Thought content normal.        Judgment: Judgment normal.       BP 138/75   Pulse 81  Temp (!) 97 F (36.1 C) (Temporal)   Ht $R'4\' 11"'RB$  (1.499 m)   Wt 234 lb (106.1 kg)   SpO2 95%   BMI 47.26 kg/m      Assessment & Plan:  Sheila [name] comes in today with chief complaint of Chronic Kidney Disease   Diagnosis and orders addressed:  1. Stage 3b chronic kidney disease ([place]) - CMP14+EGFR  2. Acute kidney injury ([place]) - CMP14+EGFR  3. Gastroesophageal reflux disease without esophagitis - CMP14+EGFR  4. Essential hypertension, benign - CMP14+EGFR   Long discussion with medications She brought in all home medications, she has stopped lasix. She is taking the demadex.  Labs pending  Avoid NSAID's  Follow up in 1 month   [name], FNP

## 2021-11-28 NOTE — Patient Instructions (Signed)
Acute Kidney Injury, Adult Acute kidney injury is a sudden worsening of kidney function. The kidneys are a pair of organs that do many important jobs in the body, including: Make urine. Make hormones. Keep the right amount of fluids and chemicals in the body. This condition ranges from mild to severe. Over time, it may develop into long-lasting (chronic) kidney disease. Finding it and treating it early may keep it from becoming a long-lasting disease. What are the causes? Common causes of this condition include: A problem with blood flow to the kidneys. This may be caused by: Low blood pressure or shock. Blood loss. Heart and blood vessel disease. Severe burns. Liver disease. Direct damage to the kidneys. This may be caused by: Certain medicines. A kidney infection. Poisoning. Being around or in contact with toxic substances. A wound from surgery. A hard, direct hit to the kidney area. A sudden block in urine flow. This may be caused by: Cancer. Kidney stones. An enlarged prostate. What increases the risk? Being older than age 65. Being female. Being in the hospital. This is especially true if you are very sick. Having certain conditions, such as: Long-lasting kidney or liver disease. Diabetes. Heart disease and heart failure. Lung disease. What are the signs or symptoms? This condition may not cause symptoms until it becomes severe. Symptoms can include: Feeling very tired or having trouble staying awake. Nausea or vomiting. Swelling (edema) of the face, legs, ankles, or feet. Pain in the belly or pain along the side of your stomach (flank). Urine changes, such as: Making little or no urine. Passing urine with a weak flow. Muscle twitches and cramps, most often in the legs. Confusion or trouble concentrating. Not feeling the urge to eat. Fever. How is this diagnosed? This condition may be diagnosed based on: Your symptoms. Your medical history. A physical  exam. You may have other tests, such as: Blood tests. Urine tests. Imaging tests. A kidney biopsy. This involves removing a sample of kidney tissue to be looked at under a microscope. How is this treated? Treatment depends on the cause and how severe the condition is. In mild cases, treatment may not be needed. The kidneys may heal on their own. In severe cases, treatment may include: Treating the cause of the kidney injury. This may mean that you have to change your medicines or the doses you take. Getting fluids through an IV tube. Having a small, thin tube (catheter) put in. This tube will drain urine and prevent blockages. Trying to keep problems from starting. This may mean not using certain medicines or not having tests done that could cause more kidney injury. In some cases, these treatments are also needed: Dialysis or continuous renal replacement therapy (CRRT). This treatment uses a machine to do the job of the kidneys. Surgery. This may be done to repair a damaged kidney. It could also be done to remove a blockage in the urinary tract. Follow these instructions at home: Medicines Take over-the-counter and prescription medicines only as told by your health care provider. Do not take any new medicines unless approved by your health care provider. Many medicines can make kidney damage worse. Do not take any vitamin or mineral supplements unless approved by your health care provider. Some of these can make kidney damage worse. Lifestyle  Make changes to your diet as told by your health care provider. You may need to eat less protein. Get to, and stay at, a healthy weight. If you need help, ask your health   care provider. Start or keep up an exercise plan. Exercise at least 30 minutes a day, 5 days a week. Do not smoke or use any products that contain nicotine or tobacco. If you need help quitting, ask your health care provider. General instructions  Keep track of your blood  pressure. Tell your health care provider if you notice any changes. Keep your vaccines up to date. Ask your health care provider which vaccines you need. Keep all follow-up visits. Where to find more information American Association of Kidney Patients: www.aakp.org National Kidney Foundation: www.kidney.org American Kidney Fund: www.akfinc.org Medical Education Institute: LifeOptions: www.lifeoptions.org Kidney School: www.kidneyschool.org Contact a health care provider if: Your symptoms get worse. You have new symptoms such as: Headaches. Skin that is darker or lighter than normal. Easy bruising. Itchiness. Hiccups. Lack of menstrual periods. You have a fever. Get help right away if: You have symptoms of worsening kidney disease, such as: Chest pain. Shortness of breath. Seizures. Confusion or trouble thinking. Belly or back pain. You have pain or bleeding when you pass urine. You are making little or no urine. These symptoms may be an emergency. Get help right away. Call 911. Do not wait to see if the symptoms will go away. Do not drive yourself to the hospital. Summary Acute kidney injury is a sudden worsening of kidney function. This condition can be caused by problems with blood flow to the kidneys, damage to the kidneys, or a sudden block in urine flow. This condition may not cause symptoms until it becomes severe. Acute kidney injury can be diagnosed with blood tests, urine tests, imaging tests, and other tests. Treatment depends on the cause and how severe the condition is. This information is not intended to replace advice given to you by your health care provider. Make sure you discuss any questions you have with your health care provider. Document Revised: 05/19/2021 Document Reviewed: 12/20/2018 Elsevier Patient Education  2023 Elsevier Inc.  

## 2021-12-03 NOTE — Telephone Encounter (Signed)
Advised at appt

## 2021-12-16 ENCOUNTER — Telehealth: Payer: Self-pay | Admitting: Family

## 2021-12-16 NOTE — Telephone Encounter (Signed)
Patient aware she has refills wil need to call the pharmacy she should have enough until 01/07/22. Will call back if they need [place].

## 2021-12-26 ENCOUNTER — Other Ambulatory Visit: Payer: Self-pay | Admitting: Family

## 2021-12-26 DIAGNOSIS — L282 Other prurigo: Secondary | ICD-10-CM

## 2021-12-29 ENCOUNTER — Ambulatory Visit (INDEPENDENT_AMBULATORY_CARE_PROVIDER_SITE_OTHER): Payer: Medicare Other | Admitting: Family

## 2021-12-29 ENCOUNTER — Other Ambulatory Visit: Payer: Self-pay | Admitting: Family

## 2021-12-29 ENCOUNTER — Encounter: Payer: Self-pay | Admitting: Family

## 2021-12-29 VITALS — BP 111/72 | HR 86 | Temp 97.5°F | Ht 59.0 in | Wt 235.0 lb

## 2021-12-29 DIAGNOSIS — E782 Mixed hyperlipidemia: Secondary | ICD-10-CM

## 2021-12-29 DIAGNOSIS — F411 Generalized anxiety disorder: Secondary | ICD-10-CM

## 2021-12-29 DIAGNOSIS — E039 Hypothyroidism, unspecified: Secondary | ICD-10-CM | POA: Diagnosis not present

## 2021-12-29 DIAGNOSIS — M19011 Primary osteoarthritis, right shoulder: Secondary | ICD-10-CM

## 2021-12-29 DIAGNOSIS — N1832 Chronic kidney disease, stage 3b: Secondary | ICD-10-CM

## 2021-12-29 DIAGNOSIS — F1320 Sedative, hypnotic or anxiolytic dependence, uncomplicated: Secondary | ICD-10-CM | POA: Diagnosis not present

## 2021-12-29 DIAGNOSIS — I10 Essential (primary) hypertension: Secondary | ICD-10-CM | POA: Diagnosis not present

## 2021-12-29 DIAGNOSIS — M549 Dorsalgia, unspecified: Secondary | ICD-10-CM

## 2021-12-29 DIAGNOSIS — N3941 Urge incontinence: Secondary | ICD-10-CM

## 2021-12-29 DIAGNOSIS — M159 Polyosteoarthritis, unspecified: Secondary | ICD-10-CM | POA: Diagnosis not present

## 2021-12-29 DIAGNOSIS — G8929 Other chronic pain: Secondary | ICD-10-CM

## 2021-12-29 DIAGNOSIS — J449 Chronic obstructive pulmonary disease, unspecified: Secondary | ICD-10-CM | POA: Diagnosis not present

## 2021-12-29 DIAGNOSIS — Z79899 Other long term (current) drug therapy: Secondary | ICD-10-CM | POA: Diagnosis not present

## 2021-12-29 DIAGNOSIS — I5022 Chronic systolic (congestive) heart failure: Secondary | ICD-10-CM

## 2021-12-29 DIAGNOSIS — R350 Frequency of micturition: Secondary | ICD-10-CM | POA: Diagnosis not present

## 2021-12-29 DIAGNOSIS — F331 Major depressive disorder, recurrent, moderate: Secondary | ICD-10-CM

## 2021-12-29 LAB — URINALYSIS, COMPLETE
Bilirubin, UA: NEGATIVE
Glucose, UA: NEGATIVE
Ketones, UA: NEGATIVE
Nitrite, UA: POSITIVE — AB
Protein,UA: NEGATIVE
RBC, UA: NEGATIVE
Specific Gravity, UA: >1.03 — ABNORMAL HIGH (ref 1.005–1.030)
Urobilinogen, Ur: 0.2 mg/dL (ref 0.2–1.0)
pH, UA: 5.5 (ref 5.0–7.5)

## 2021-12-29 LAB — MICROSCOPIC EXAMINATION
Epithelial Cells (non renal): NONE SEEN /hpf (ref 0–10)
RBC, Urine: NONE SEEN /hpf (ref 0–2)
Renal Epithel, UA: NONE SEEN /hpf
WBC, UA: >30 /hpf — AB (ref 0–5)

## 2021-12-29 MED ORDER — TRAMADOL HCL 50 MG PO TABS: 100.0000 mg | ORAL_TABLET | Freq: Two times a day (BID) | ORAL | 2 refills | Status: DC | PRN

## 2021-12-29 MED ORDER — ALPRAZOLAM 0.5 MG PO TABS: 0.5000 mg | ORAL_TABLET | Freq: Two times a day (BID) | ORAL | 2 refills | Status: DC | PRN

## 2021-12-29 NOTE — Progress Notes (Signed)
Subjective:    Patient ID: Sheila [name], female    DOB: May 01, 1940, 81 y.o.   MRN: [id number]  Chief Complaint  Patient presents with   Follow-up    Wants a whole new change on Protonix . Wants a different brand    Pt presents to the office today for chronic follow up. She is followed by Cardiologists every 3 months for CAD and CHF. She is currently has an aid and family that stays with her 24/7.   She reports she is having a hard time walking long distances. She has to use a rolling walker at all times, however, she can not walk more than a few feet without having to relax.    She saw an Endocrinologists for  hypercalcemia & hyperparathyroidism. She is followed by Dermatologists for skin lesion.    She has COPD and states her breathing is stable as long as she it sitting and does not have to wear a mask. States she gets SOB with exertion.    She has CKD and tries to limit NSAID's.  Hypertension This is a chronic problem. The current episode started more than 1 year ago. The problem has been resolved since onset. The problem is controlled. Associated symptoms include anxiety, malaise/fatigue and shortness of breath. Pertinent negatives include no peripheral edema. Risk factors for coronary artery disease include dyslipidemia, diabetes mellitus, obesity and sedentary lifestyle. The current treatment provides moderate improvement. Identifiable causes of hypertension include a thyroid problem.  Congestive Heart Failure Presents for follow-up visit. Associated symptoms include fatigue and shortness of breath. Pertinent negatives include no edema. The symptoms have been stable.  Thyroid Problem Presents for follow-up visit. Symptoms include anxiety, depressed mood, fatigue and hoarse voice. Patient reports no constipation or diarrhea. The symptoms have been stable. Her past medical history is significant for hyperlipidemia.  Arthritis Presents for follow-up visit. She complains of pain and  stiffness. Affected locations include the left knee, right knee, left MCP and right MCP. Her pain is at a severity of 8/10. Associated symptoms include fatigue. Pertinent negatives include no diarrhea.  Anxiety Presents for follow-up visit. Symptoms include depressed mood, excessive worry, irritability, nervous/anxious behavior and shortness of breath. Patient reports no restlessness. Symptoms occur occasionally. The severity of symptoms is moderate.    Hyperlipidemia This is a chronic problem. The problem is controlled. Recent lipid tests were reviewed and are normal. Exacerbating diseases include obesity. Associated symptoms include shortness of breath. Current antihyperlipidemic treatment includes statins. The current treatment provides moderate improvement of lipids. Risk factors for coronary artery disease include dyslipidemia, diabetes mellitus, hypertension, a sedentary lifestyle and post-menopausal.  Depression        This is a chronic problem.  The current episode started more than 1 year ago.   Associated symptoms include fatigue.  Associated symptoms include no helplessness, no hopelessness, not irritable, no restlessness and not sad.  Past medical history includes thyroid problem and anxiety.    Current opioids rx- Ultram 50 mg #45 and Xanax 0.5 mg #60 Effectiveness of current meds-stable Adverse reactions from pain meds-none Morphine equivalent- 20   Pill count performed-No Last drug screen - 05/15/21 ( high risk q7m moderate risk q628mlow risk yearly ) Urine drug screen today- No [place]- Yes             If yes were their any concerning findings? - none   Pain contract signed on: 04/25/21   Review of Systems  Constitutional:  Positive  for fatigue, irritability and malaise/fatigue.  HENT:  Positive for hoarse voice.   Respiratory:  Positive for shortness of breath.   Gastrointestinal:  Negative for constipation and diarrhea.  Musculoskeletal:  Positive for  arthritis and stiffness.  Psychiatric/Behavioral:  Positive for depression. The patient is nervous/anxious.   All other systems reviewed and are negative.      Objective:   Physical Exam Vitals reviewed.  Constitutional:      General: She is not irritable.She is not in acute distress.    Appearance: She is well-developed. She is obese.  HENT:     Head: Normocephalic and atraumatic.     Right Ear: Tympanic membrane normal.     Left Ear: Tympanic membrane normal.  Eyes:     Pupils: Pupils are equal, round, and reactive to light.  Neck:     Thyroid: No thyromegaly.  Cardiovascular:     Rate and Rhythm: Normal rate and regular rhythm.     Heart sounds: Normal heart sounds. No murmur heard. Pulmonary:     Effort: Pulmonary effort is normal. No respiratory distress.     Breath sounds: Normal breath sounds. No wheezing.  Abdominal:     General: Bowel sounds are normal. There is no distension.     Palpations: Abdomen is soft.     Tenderness: There is no abdominal tenderness.  Musculoskeletal:        General: No tenderness. Normal range of motion.     Cervical back: Normal range of motion and neck supple.  Skin:    General: Skin is warm and dry.  Neurological:     Mental Status: She is alert and oriented to person, place, and time.     Cranial Nerves: No cranial nerve deficit.     Motor: Weakness (using rolling walker) present.     Gait: Gait abnormal.     Deep Tendon Reflexes: Reflexes are normal and symmetric.  Psychiatric:        Behavior: Behavior normal.        Thought Content: Thought content normal.        Judgment: Judgment normal.       BP 111/72   Pulse 86   Temp (!) 97.5 F (36.4 C) (Temporal)   Ht 4' 11" (1.499 m)   Wt 235 lb (106.6 kg)   SpO2 94%   BMI 47.46 kg/m      Assessment & Plan:  Tameah H Coberly comes in today with chief complaint of Follow-up (Wants a whole new change on Protonix . Wants a different brand )   Diagnosis and orders  addressed:  1. GAD (generalized anxiety disorder) - ALPRAZolam (XANAX) 0.5 MG tablet; Take 1 tablet (0.5 mg total) by mouth 2 (two) times daily as needed.  Dispense: 60 tablet; Refill: 2 - CMP14+EGFR  2. Benzodiazepine dependence (HCC) - ALPRAZolam (XANAX) 0.5 MG tablet; Take 1 tablet (0.5 mg total) by mouth 2 (two) times daily as needed.  Dispense: 60 tablet; Refill: 2 - CMP14+EGFR  3. Controlled substance agreement signed - ALPRAZolam (XANAX) 0.5 MG tablet; Take 1 tablet (0.5 mg total) by mouth 2 (two) times daily as needed.  Dispense: 60 tablet; Refill: 2 - traMADol (ULTRAM) 50 MG tablet; Take 2 tablets (100 mg total) by mouth every 12 (twelve) hours as needed. TAKE TWO TABLETS EVERY TWELVE HOURS AS NEEDED FOR PAIN  Dispense: 45 tablet; Refill: 2 - CMP14+EGFR  4. Primary osteoarthritis involving multiple joints - traMADol (ULTRAM) 50 MG tablet; Take 2 tablets (  100 mg total) by mouth every 12 (twelve) hours as needed. TAKE TWO TABLETS EVERY TWELVE HOURS AS NEEDED FOR PAIN  Dispense: 45 tablet; Refill: 2 - CMP14+EGFR  5. Chronic bilateral back pain, unspecified back location - traMADol (ULTRAM) 50 MG tablet; Take 2 tablets (100 mg total) by mouth every 12 (twelve) hours as needed. TAKE TWO TABLETS EVERY TWELVE HOURS AS NEEDED FOR PAIN  Dispense: 45 tablet; Refill: 2 - CMP14+EGFR  6. Urgency incontinence - Urinalysis, Complete - CMP14+EGFR  7. Hypothyroidism, unspecified type - CMP14+EGFR  8. Mixed hyperlipidemia - CMP14+EGFR  9. Essential hypertension, benign - CMP14+EGFR  10. Moderate episode of recurrent major depressive disorder (HCC) - CMP14+EGFR  11. Morbid obesity (HCC) - CMP14+EGFR  12. Chronic obstructive pulmonary disease, unspecified COPD type (HCC) - CMP14+EGFR  13. Primary osteoarthritis of right shoulder - CMP14+EGFR  14. Stage 3b chronic kidney disease (HCC) - CMP14+EGFR  15. Chronic systolic CHF (congestive heart failure) (HCC)  - CMP14+EGFR  16.  Urinary frequency - Urinalysis, Complete   Labs pending Patient reviewed in [place] controlled database, no flags noted. Contract and drug screen are up to date.  Health Maintenance reviewed Diet and exercise encouraged  Follow up plan: 3 months     , FNP    

## 2021-12-29 NOTE — Patient Instructions (Signed)
Health Maintenance After Age 80 After age 80, you are at a higher risk for certain long-term diseases and infections as well as injuries from falls. Falls are a major cause of broken bones and head injuries in people who are older than age 80. Getting regular preventive care can help to keep you healthy and well. Preventive care includes getting regular testing and making lifestyle changes as recommended by your health care provider. Talk with your health care provider about: Which screenings and tests you should have. A screening is a test that checks for a disease when you have no symptoms. A diet and exercise plan that is right for you. What should I know about screenings and tests to prevent falls? Screening and testing are the best ways to find a health problem early. Early diagnosis and treatment give you the best chance of managing medical conditions that are common after age 80. Certain conditions and lifestyle choices may make you more likely to have a fall. Your health care provider may recommend: Regular vision checks. Poor vision and conditions such as cataracts can make you more likely to have a fall. If you wear glasses, make sure to get your prescription updated if your vision changes. Medicine review. Work with your health care provider to regularly review all of the medicines you are taking, including over-the-counter medicines. Ask your health care provider about any side effects that may make you more likely to have a fall. Tell your health care provider if any medicines that you take make you feel dizzy or sleepy. Strength and balance checks. Your health care provider may recommend certain tests to check your strength and balance while standing, walking, or changing positions. Foot health exam. Foot pain and numbness, as well as not wearing proper footwear, can make you more likely to have a fall. Screenings, including: Osteoporosis screening. Osteoporosis is a condition that causes  the bones to get weaker and break more easily. Blood pressure screening. Blood pressure changes and medicines to control blood pressure can make you feel dizzy. Depression screening. You may be more likely to have a fall if you have a fear of falling, feel depressed, or feel unable to do activities that you used to do. Alcohol use screening. Using too much alcohol can affect your balance and may make you more likely to have a fall. Follow these instructions at home: Lifestyle Do not drink alcohol if: Your health care provider tells you not to drink. If you drink alcohol: Limit how much you have to: 0-1 drink a day for women. 0-2 drinks a day for men. Know how much alcohol is in your drink. In the [place] one drink equals one 12 oz bottle of beer (355 mL), one 5 oz glass of wine (148 mL), or one 1 oz glass of hard liquor (44 mL). Do not use any products that contain nicotine or tobacco. These products include cigarettes, chewing tobacco, and vaping devices, such as e-cigarettes. If you need help quitting, ask your health care provider. Activity  Follow a regular exercise program to stay fit. This will help you maintain your balance. Ask your health care provider what types of exercise are appropriate for you. If you need a cane or walker, use it as recommended by your health care provider. Wear supportive shoes that have nonskid soles. Safety  Remove any tripping hazards, such as rugs, cords, and clutter. Install safety equipment such as grab bars in bathrooms and safety rails on stairs. Keep rooms and walkways   well-lit. General instructions Talk with your health care provider about your risks for falling. Tell your health care provider if: You fall. Be sure to tell your health care provider about all falls, even ones that seem minor. You feel dizzy, tiredness (fatigue), or off-balance. Take over-the-counter and prescription medicines only as told by your health care provider. These include  supplements. Eat a healthy diet and maintain a healthy weight. A healthy diet includes low-fat dairy products, low-fat (lean) meats, and fiber from whole grains, beans, and lots of fruits and vegetables. Stay current with your vaccines. Schedule regular health, dental, and eye exams. Summary Having a healthy lifestyle and getting preventive care can help to protect your health and wellness after age 80. Screening and testing are the best way to find a health problem early and help you avoid having a fall. Early diagnosis and treatment give you the best chance for managing medical conditions that are more common for people who are older than age 80. Falls are a major cause of broken bones and head injuries in people who are older than age 80. Take precautions to prevent a fall at home. Work with your health care provider to learn what changes you can make to improve your health and wellness and to prevent falls. This information is not intended to replace advice given to you by your health care provider. Make sure you discuss any questions you have with your health care provider. Document Revised: 07/01/2020 Document Reviewed: 07/01/2020 Elsevier Patient Education  2023 [employer]  

## 2021-12-30 ENCOUNTER — Other Ambulatory Visit: Payer: Self-pay | Admitting: Family

## 2021-12-30 LAB — CMP14+EGFR
ALT: 23 IU/L (ref 0–32)
AST: 29 IU/L (ref 0–40)
Albumin/Globulin Ratio: 1.7 (ref 1.2–2.2)
Albumin: 4.7 g/dL (ref 3.8–4.8)
Alkaline Phosphatase: 45 IU/L (ref 44–121)
BUN/Creatinine Ratio: 17 (ref 12–28)
BUN: 24 mg/dL (ref 8–27)
Bilirubin Total: 0.8 mg/dL (ref 0.0–1.2)
CO2: 21 mmol/L (ref 20–29)
Calcium: 10.4 mg/dL — ABNORMAL HIGH (ref 8.7–10.3)
Chloride: 100 mmol/L (ref 96–106)
Creatinine, Ser: 1.41 mg/dL — ABNORMAL HIGH (ref 0.57–1.00)
Globulin, Total: 2.8 g/dL (ref 1.5–4.5)
Glucose: 96 mg/dL (ref 70–99)
Potassium: 4.6 mmol/L (ref 3.5–5.2)
Sodium: 140 mmol/L (ref 134–144)
Total Protein: 7.5 g/dL (ref 6.0–8.5)
eGFR: 38 mL/min/{1.73_m2} — ABNORMAL LOW (ref >59)

## 2021-12-30 MED ORDER — CEPHALEXIN 500 MG PO CAPS: 500.0000 mg | ORAL_CAPSULE | Freq: Two times a day (BID) | ORAL | 0 refills | Status: DC

## 2022-01-20 ENCOUNTER — Other Ambulatory Visit: Payer: Self-pay | Admitting: Family

## 2022-01-20 DIAGNOSIS — L282 Other prurigo: Secondary | ICD-10-CM

## 2022-01-31 ENCOUNTER — Other Ambulatory Visit: Payer: Self-pay | Admitting: Family

## 2022-01-31 DIAGNOSIS — Z79899 Other long term (current) drug therapy: Secondary | ICD-10-CM

## 2022-01-31 DIAGNOSIS — M159 Polyosteoarthritis, unspecified: Secondary | ICD-10-CM

## 2022-01-31 DIAGNOSIS — G8929 Other chronic pain: Secondary | ICD-10-CM

## 2022-02-13 ENCOUNTER — Other Ambulatory Visit: Payer: Self-pay | Admitting: Family

## 2022-02-18 ENCOUNTER — Other Ambulatory Visit: Payer: Self-pay | Admitting: Internal Medicine

## 2022-02-19 ENCOUNTER — Other Ambulatory Visit: Payer: Self-pay | Admitting: Family

## 2022-02-19 DIAGNOSIS — M159 Polyosteoarthritis, unspecified: Secondary | ICD-10-CM

## 2022-02-19 DIAGNOSIS — Z79899 Other long term (current) drug therapy: Secondary | ICD-10-CM

## 2022-02-19 DIAGNOSIS — G8929 Other chronic pain: Secondary | ICD-10-CM

## 2022-02-27 ENCOUNTER — Telehealth: Payer: Self-pay | Admitting: Family

## 2022-02-27 MED ORDER — TORSEMIDE 20 MG PO TABS: 20.0000 mg | ORAL_TABLET | Freq: Two times a day (BID) | ORAL | 1 refills | Status: DC

## 2022-02-27 NOTE — Telephone Encounter (Signed)
Prescription sent to pharmacy.

## 2022-03-05 ENCOUNTER — Other Ambulatory Visit: Payer: Self-pay | Admitting: Family

## 2022-03-05 DIAGNOSIS — F331 Major depressive disorder, recurrent, moderate: Secondary | ICD-10-CM

## 2022-03-05 DIAGNOSIS — F411 Generalized anxiety disorder: Secondary | ICD-10-CM

## 2022-03-14 ENCOUNTER — Other Ambulatory Visit: Payer: Self-pay | Admitting: Family

## 2022-03-20 ENCOUNTER — Other Ambulatory Visit: Payer: Self-pay | Admitting: Family

## 2022-03-20 DIAGNOSIS — L282 Other prurigo: Secondary | ICD-10-CM

## 2022-03-24 ENCOUNTER — Telehealth: Payer: Self-pay | Admitting: Family

## 2022-03-24 MED ORDER — DENOSUMAB 60 MG/ML ~~LOC~~ SOSY: 60.0000 mg | PREFILLED_SYRINGE | SUBCUTANEOUS | 0 refills | Status: DC

## 2022-03-24 NOTE — Telephone Encounter (Signed)
Patient daughter aware that Prolia is due and rx sent to [facility].

## 2022-03-28 IMAGING — CT CT CARDIAC CORONARY ARTERY CALCIUM SCORE
3 series · 14 of 20 positions shown, 15 images · non-contrast
Comparison: None.
COMPARISON: None.

Addendum:
EXAM:
OVER-READ INTERPRETATION  CT CHEST

The following report is an over-read performed by radiologist Dr.
Shiro De Reyes [REDACTED] on 09/20/2019. This
over-read does not include interpretation of cardiac or coronary
anatomy or pathology. The coronary calcium score interpretation by
the cardiologist is attached.
CLINICAL DATA: Risk stratification
Coronary Calcium Score
TECHNIQUE: The patient was scanned on a Siemens Force scanner. Axial
non-contrast 3 mm slices were carried out through the heart. The
data set was analyzed on a dedicated work station and scored using
the Agatson method.

[Series 2: casc 3.0 bv41 2 bestsyst 37 % · axial · 0.41mm/px · z∈[-189,-108]mm · 4 of 46 slices shown, 5 images]
[im 10/46  vessel]
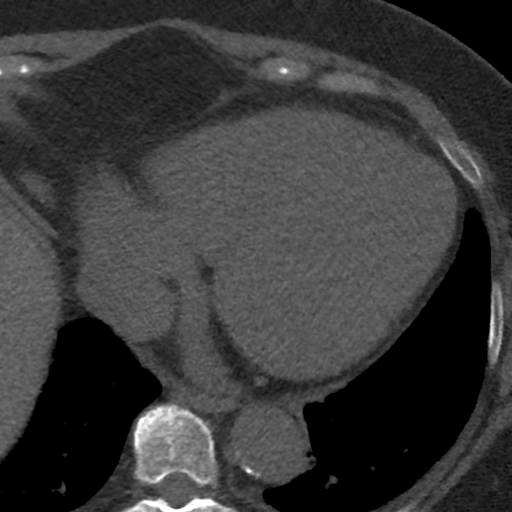
[im 10/46  lung]
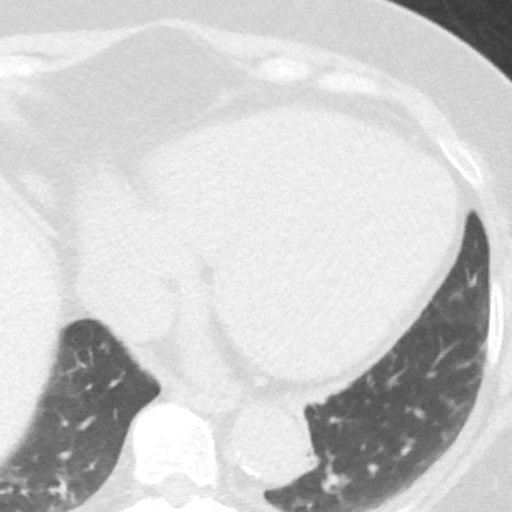
[im 19/46  vessel]
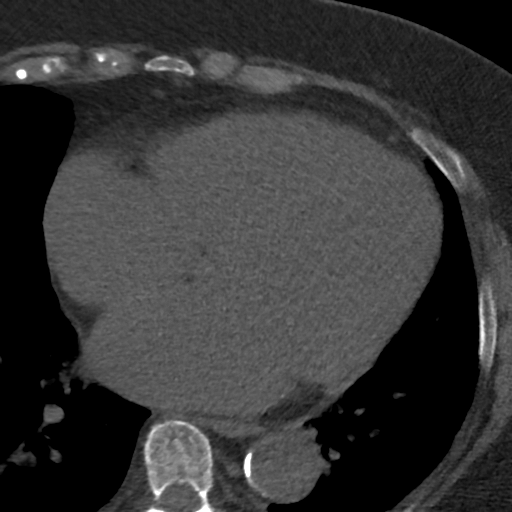
[im 28/46  vessel]
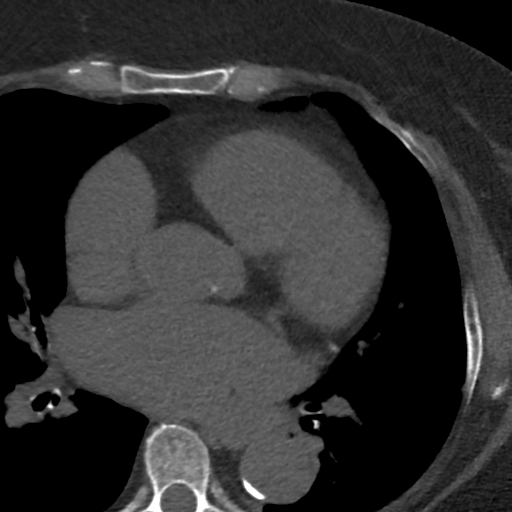
[im 37/46  vessel]
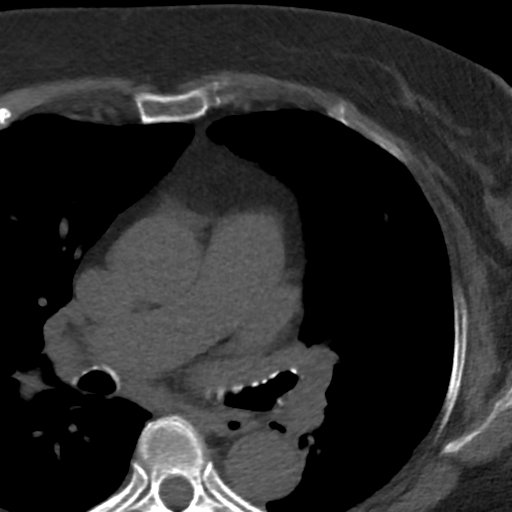

[Series 3: lung 38 % · axial · 0.68mm/px · z∈[-194,-104]mm · 5 of 46 slices shown]
[im 8/46  lung]
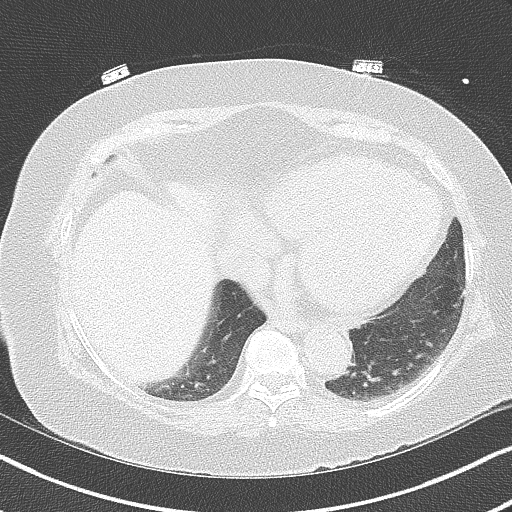
[im 16/46  lung]
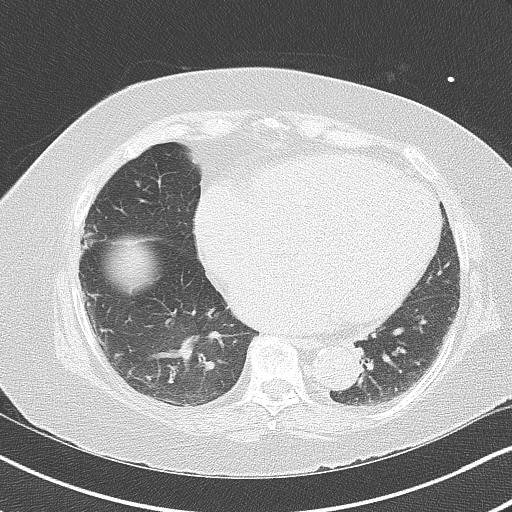
[im 23/46  lung]
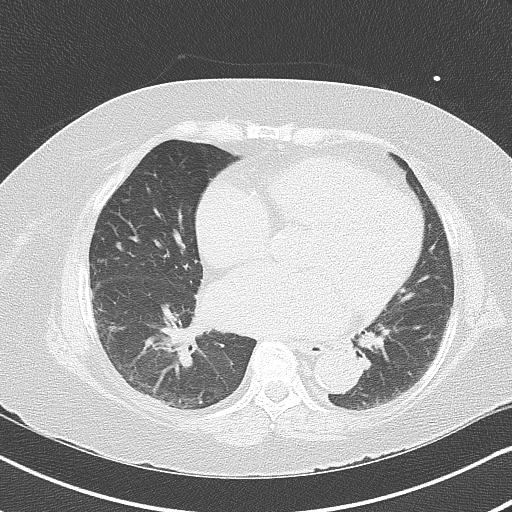
[im 31/46  lung]
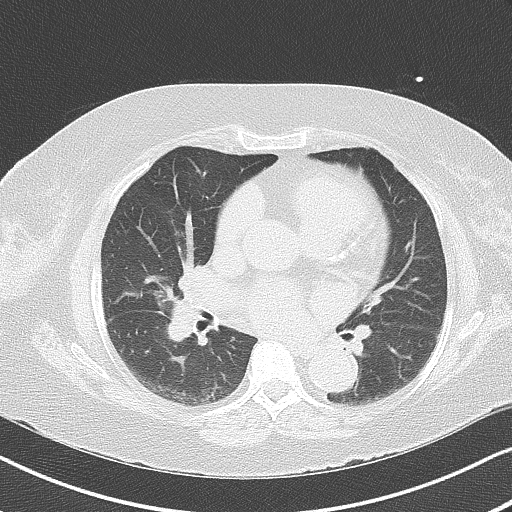
[im 38/46  lung]
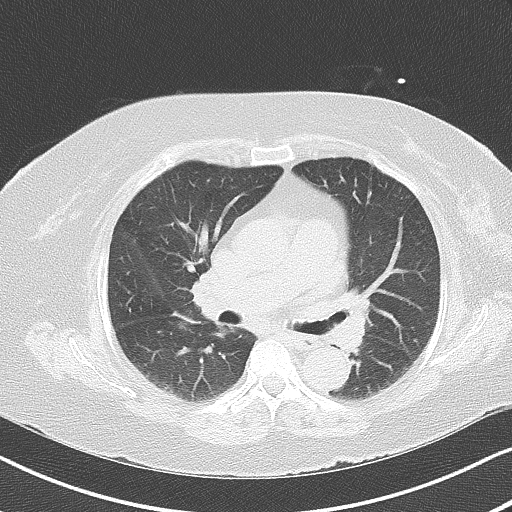

[Series 4: lung st 38 % · axial · 0.68mm/px · z∈[-194,-104]mm · 5 of 46 slices shown]
[im 8/46  lung]
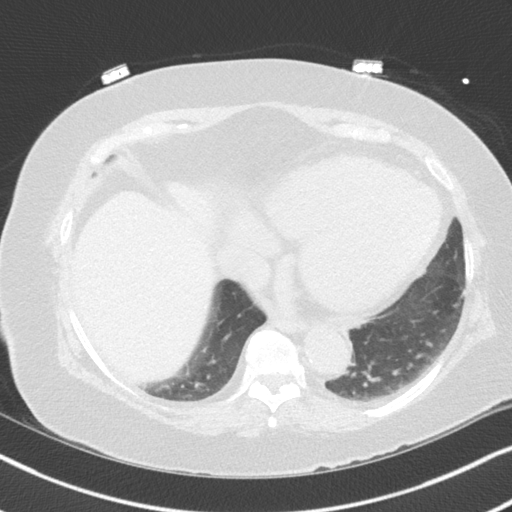
[im 16/46  lung]
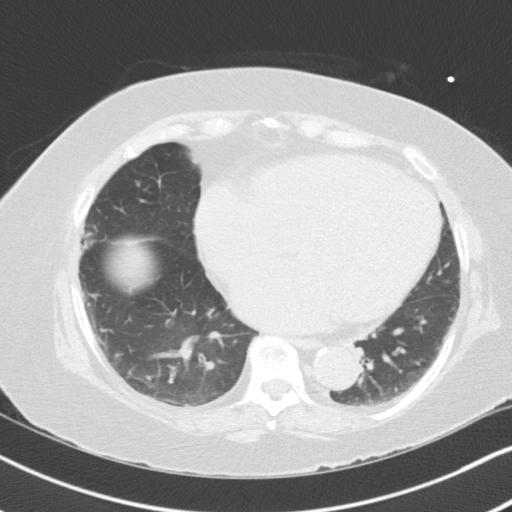
[im 23/46  lung]
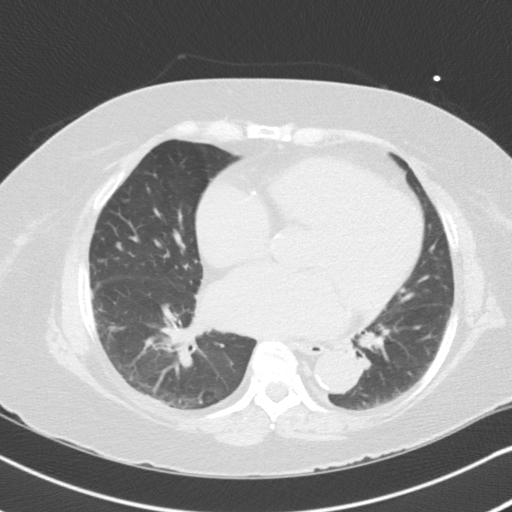
[im 31/46  lung]
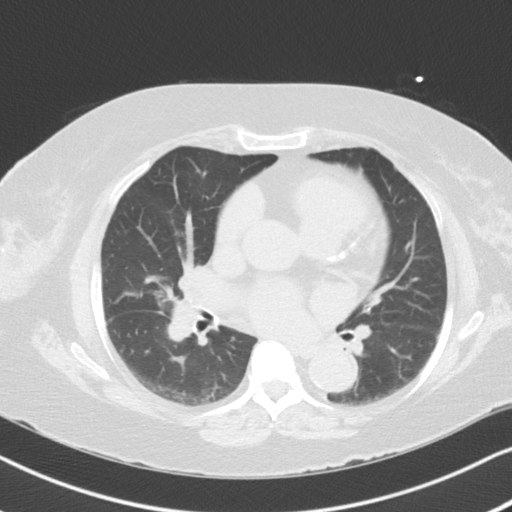
[im 38/46  lung]
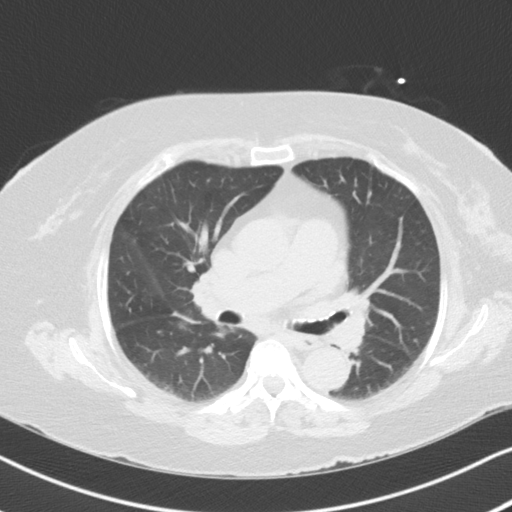

[14 of 20 positions shown; findings below may reference images not displayed]

FINDINGS: Aortic atherosclerosis. Within the visualized portions of the thorax
there are no suspicious appearing pulmonary nodules or masses, there
is no acute consolidative airspace disease, no pleural effusions, no
pneumothorax and no lymphadenopathy. Visualized portions of the
upper abdomen are unremarkable. There are no aggressive appearing
lytic or blastic lesions noted in the visualized portions of the
skeleton.
IMPRESSION: 1.  Aortic Atherosclerosis (9L40L-O5A.A).
FINDINGS: Non-cardiac: See separate report from [REDACTED].

Ascending Aorta: Normal.

Pericardium: Normal.

Coronary arteries: Normal Origin.
IMPRESSION: Coronary calcium score of 318 (mostly in the LAD). This was
percentile 69 for age and sex matched control.

Franco Pipkin,DO

*** End of Addendum ***
EXAM:
OVER-READ INTERPRETATION  CT CHEST

The following report is an over-read performed by radiologist Dr.
Shiro De Reyes [REDACTED] on 09/20/2019. This
over-read does not include interpretation of cardiac or coronary
anatomy or pathology. The coronary calcium score interpretation by
the cardiologist is attached.
FINDINGS: Aortic atherosclerosis. Within the visualized portions of the thorax
there are no suspicious appearing pulmonary nodules or masses, there
is no acute consolidative airspace disease, no pleural effusions, no
pneumothorax and no lymphadenopathy. Visualized portions of the
upper abdomen are unremarkable. There are no aggressive appearing
lytic or blastic lesions noted in the visualized portions of the
skeleton.
IMPRESSION: 1.  Aortic Atherosclerosis (9L40L-O5A.A).

## 2022-03-30 ENCOUNTER — Other Ambulatory Visit: Payer: Self-pay | Admitting: Family

## 2022-03-30 DIAGNOSIS — G629 Polyneuropathy, unspecified: Secondary | ICD-10-CM

## 2022-03-31 ENCOUNTER — Other Ambulatory Visit: Payer: Self-pay | Admitting: Family

## 2022-03-31 ENCOUNTER — Ambulatory Visit: Payer: 59 | Admitting: Family

## 2022-04-01 ENCOUNTER — Other Ambulatory Visit: Payer: Self-pay | Admitting: Family

## 2022-04-01 DIAGNOSIS — M159 Polyosteoarthritis, unspecified: Secondary | ICD-10-CM

## 2022-04-01 DIAGNOSIS — G8929 Other chronic pain: Secondary | ICD-10-CM

## 2022-04-01 DIAGNOSIS — Z79899 Other long term (current) drug therapy: Secondary | ICD-10-CM

## 2022-04-14 ENCOUNTER — Ambulatory Visit (HOSPITAL_COMMUNITY)
Admission: RE | Admit: 2022-04-14 | Discharge: 2022-04-14 | Disposition: A | Discharge status: Home / Self Care | Payer: 59 | Source: Ambulatory Visit | Attending: Internal Medicine | Admitting: Internal Medicine

## 2022-04-14 ENCOUNTER — Other Ambulatory Visit: Payer: Self-pay | Admitting: Family

## 2022-04-14 DIAGNOSIS — I428 Other cardiomyopathies: Secondary | ICD-10-CM

## 2022-04-14 DIAGNOSIS — L282 Other prurigo: Secondary | ICD-10-CM

## 2022-04-14 LAB — ECHOCARDIOGRAM COMPLETE
AR max vel: 1.17 cm2
AV Area VTI: 1.09 cm2
AV Area mean vel: 1.28 cm2
AV Mean grad: 5 mmHg
AV Peak grad: 10.4 mmHg
Ao pk vel: 1.61 m/s
Area-P 1/2: 3.37 cm2
S' Lateral: 4.3 cm

## 2022-04-14 MED ORDER — PERFLUTREN LIPID MICROSPHERE
1.0000 mL | INTRAVENOUS | Status: AC | PRN
  Administered 2022-04-14: 4 mL via INTRAVENOUS

## 2022-04-14 NOTE — Progress Notes (Signed)
*  PRELIMINARY RESULTS* Echocardiogram 2D Echocardiogram has been performed with Definity.  Sheila [name] 04/14/2022, 12:50 PM

## 2022-04-16 ENCOUNTER — Ambulatory Visit: Payer: 59 | Attending: Internal Medicine | Admitting: Internal Medicine

## 2022-04-16 ENCOUNTER — Encounter: Payer: Self-pay | Admitting: Internal Medicine

## 2022-04-16 VITALS — BP 110/74 | HR 74 | Ht 59.0 in | Wt 235.0 lb

## 2022-04-16 DIAGNOSIS — I5043 Acute on chronic combined systolic (congestive) and diastolic (congestive) heart failure: Secondary | ICD-10-CM

## 2022-04-16 DIAGNOSIS — I10 Essential (primary) hypertension: Secondary | ICD-10-CM

## 2022-04-16 DIAGNOSIS — I428 Other cardiomyopathies: Secondary | ICD-10-CM | POA: Diagnosis not present

## 2022-04-16 MED ORDER — SACUBITRIL-VALSARTAN 97-103 MG PO TABS: 1.0000 | ORAL_TABLET | Freq: Two times a day (BID) | ORAL | 11 refills | Status: DC

## 2022-04-16 NOTE — Progress Notes (Signed)
Cardiology Office Note:    Date:  04/16/2022   ID:  Sheila [name], DOB 22-Jan-1941, MRN [id number]  PCP:  Sharion Balloon, FNP  Cardiologist:  Dr. Gwenlyn Found --> pt family wishes to switch providers  Referring MD: Sharion Balloon, FNP   No chief complaint on file.   History of Present Illness:    Sheila [name] is a 82 y.o. female with a hx of GAD on chronic benzodiazepine, chronic back pain, HTN, COPD, hyperparathyroidism, OA, CKD stage III, HLD, MDD, obesity, chronic systolic and diastolic heart failure, NICM with normal coronaries by heart cath 2021.   Echo 09/17/19 with LVEF 20-25% grade 3 DD, moderate MR and TR. Right and left heart cath 10/02/19 with normal coronary arteries. She was admitted and AHF service consulted. She was diuresed using ionotropes and discharged on entresto and spironolactone. Coreg D/C'ed for low cardiac output. On follow up 10/2019, she was somewhat confused. Digoxin level obtained was 1.1 and this was discontinued. She saw Dr. Haroldine Laws in the heart failure clinic who recommended palliative care. She is minimally ambulatory and generally wheelchair-bound with symptoms of chronic heart failure. She last saw Dr. Gwenlyn Found 03/29/20 and was doing a bit better. She had home health and hospice care at that time. She was walking with a walker.   She called our office stating she no longer had help or hospice care coming to the house. She wants to get this reinstated. On presentation today, she presents in a wheelchair. They request palliative care, a bedside commode and something for skin itching. I actually do not think she is extremely volume up. She is taking 49-51 mg entresto, spiro, and 20 mg torsemide daily. She has stopped potassium supplement.   10/22/2021  Sheila [name] is seen today in follow-up.  She has requested transfer of care from Dr. Gwenlyn Found to a new cardiologist and was placed on the DOD schedule today.  She previously saw Angie Duke as above for follow-up.  Unfortunately in  2021 she had significant heart failure which was a nonischemic cardiomyopathy.  It was felt that she had limited therapy options and palliative care was recommended.  She was started on guideline directed medical therapy for heart failure.  Fortunately she had a repeat echo in January 2023 which shows a significant improvement in her LVEF up to 45 to 50%.  She has not had any hospitalizations for heart failure but was admitted this year for UTI/urosepsis at [facility].  She denies any worsening shortness of breath or chest pain.  Blood pressure was noted to be low today however is not clear if this is an outlier.  She does get home health who evaluates that intermittently.  04/16/2022  Sheila [name] is seen today in follow-up.  She is accompanied by her daughter who lives in [place].  She reports she has been fairly stable.  She has outlived hospice but remains in palliative care.  She had a recent repeat echo that showed a decline in her LVEF from 45 to 50% down to 35 to 40%.  This is not quite as low as it has been in the distant past.  Recently she has had some worsening shortness of breath with exertion and lower extremity swelling.   Past Medical History:  Diagnosis Date   Allergy    Bell's palsy    Cancer ([place])    skin CA on face   Cataract    Chest pain 07/2009   Myoveiw stress test negative.  Depression    Diverticulosis of colon    DJD (degenerative joint disease) of knee    Dyspnea    on exertion   Family history of adverse reaction to anesthesia    sister ha nausea/vomiting   Gastric ulcer with hemorrhage 01/14/2011   GERD (gastroesophageal reflux disease)    Headache    History of fractured pelvis    Hypercholesteremia    Hypertension    Hypothyroidism    LV dysfunction    UTI (lower urinary tract infection)     Past Surgical History:  Procedure Laterality Date   APPENDECTOMY     BREAST BIOPSY Left    ESOPHAGOGASTRODUODENOSCOPY  01/14/2011   Procedure:  ESOPHAGOGASTRODUODENOSCOPY (EGD);  Surgeon: Rogene Houston, MD;  Location: [facility];  Service: Endoscopy;  Laterality: N/A;   HIP SURGERY     pelvis   JOINT REPLACEMENT     KNEE SURGERY  04/2010.   Total left knee replacement   LAPAROSCOPIC APPENDECTOMY N/A 08/01/2013   Procedure: APPENDECTOMY LAPAROSCOPIC;  Surgeon: Jamesetta So, MD;  Location: [facility];  Service: General;  Laterality: N/A;   RIGHT/LEFT HEART CATH AND CORONARY ANGIOGRAPHY N/A 10/02/2019   Procedure: RIGHT/LEFT HEART CATH AND CORONARY ANGIOGRAPHY;  Surgeon: Lorretta Harp, MD;  Location: [facility];  Service: Cardiovascular;  Laterality: N/A;    Current Medications: Current Meds  Medication Sig   albuterol (PROVENTIL) (2.5 MG/3ML) 0.083% nebulizer solution NEBULIZE 1 VIAL EVERY 6 TO 8 HOURS AS NEEDED FOR WHEEZING OR SHORTNESS OF BREATH   ALPRAZolam (XANAX) 0.5 MG tablet Take 1 tablet (0.5 mg total) by mouth 2 (two) times daily as needed.   BREZTRI AEROSPHERE 160-9-4.8 MCG/ACT AERO INHALE 2 PUFFS INTO LUNGS 2 TIMES A DAY   Cholecalciferol (VITAMIN D3) 25 MCG (1000 UT) CAPS Take 1,000 Units by mouth daily.    cinacalcet (SENSIPAR) 30 MG tablet Take 1 tablet (30 mg total) by mouth every other day.   CRANBERRY PO Take 1 tablet by mouth daily.   denosumab (PROLIA) 60 MG/ML SOSY injection Inject 60 mg into the skin every 6 (six) months.   fluticasone (CUTIVATE) 0.05 % cream Apply topically 2 (two) times daily as needed.   gabapentin (NEURONTIN) 300 MG capsule TAKE ONE CAPSULE THREE TIMES DAILY   hydrOXYzine (VISTARIL) 25 MG capsule TAKE ONE CAPSULE THREE TIMES DAILY AS NEEDED   ketoconazole (NIZORAL) 2 % cream APPLY TO THE AFFECTED AREA(S) DAILY AS DIRECTED   levocetirizine (XYZAL) 5 MG tablet TAKE ONE TABLET ONCE DAILY EVERY EVENING   levothyroxine (SYNTHROID) 100 MCG tablet TAKE ONE TABLET ONCE DAILY   LIVALO 4 MG TABS TAKE ONE TABLET ONCE DAILY   metoprolol tartrate (LOPRESSOR) 25 MG tablet TAKE 1/2 TABLET TWICE  DAILY   nystatin (MYCOSTATIN/NYSTOP) powder Apply 1 Application topically 3 (three) times daily.   pantoprazole (PROTONIX) 20 MG tablet Take 1 tablet (20 mg total) by mouth 2 (two) times daily.   PROLIA 60 MG/ML SOSY injection Inject into the skin.   sacubitril-valsartan (ENTRESTO) 97-103 MG Take 1 tablet by mouth 2 (two) times daily.   sertraline (ZOLOFT) 100 MG tablet TAKE ONE TABLET ONCE DAILY   spironolactone (ALDACTONE) 25 MG tablet TAKE 1 TABLET ONCE DAILY   Tetrahydrozoline HCl (VISINE OP) Place 1 drop into both eyes daily as needed (dry eyes/itching).   torsemide (DEMADEX) 20 MG tablet Take 1 tablet (20 mg total) by mouth 2 (two) times daily. TAKE 2 TABLETS DAILY AS DIRECTED Strength: 20  mg   traMADol (ULTRAM) 50 MG tablet TAKE TWO TABLETS EVERY TWELVE HOURS AS NEEDED FOR PAIN   triamcinolone cream (KENALOG) 0.1 % Apply topically 2 (two) times daily as needed.   [DISCONTINUED] ENTRESTO 49-51 MG TAKE 1 TABLET 2 TIMES A DAY     Allergies:   Ibuprofen, Benadryl [diphenhydramine hcl], Lipitor [atorvastatin], Baclofen, Hydrocodone, Codeine, Morphine and related, and Tdap [tetanus-diphth-acell pertussis]   Social History   Socioeconomic History   Marital status: Widowed    Spouse name: Not on file   Number of children: 2   Years of education: 0   Highest education level: Never attended school  Occupational History   Occupation: retired  Tobacco Use   Smoking status: Former    Types: Cigarettes    Quit date: 02/24/1968    Years since quitting: 54.1   Smokeless tobacco: Current    Types: Snuff   Tobacco comments:    quit yrs and yrs ago when she was very young  Media planner   Vaping Use: Never used  Substance and Sexual Activity   Alcohol use: No   Drug use: No   Sexual activity: Not Currently    Birth control/protection: Post-menopausal  Other Topics Concern   Not on file  Social History Narrative   Lives alone, but has 24 hour caregiver, palliative care   Daughter lives  nearby and helps her with paying bills, shopping, etc.   Social Determinants of Health   Financial Resource Strain: [place]  (12/30/2020)   Overall Financial Resource Strain (CARDIA)    Difficulty of Paying Living Expenses: Not very hard  Food Insecurity: No Food Insecurity (12/30/2020)   Hunger Vital Sign    Worried About Running Out of Food in the Last Year: Never true    [place] in the Last Year: Never true  Transportation Needs: No Transportation Needs (12/30/2020)   PRAPARE - Hydrologist (Medical): No    Lack of Transportation (Non-Medical): No  Physical Activity: Inactive (12/30/2020)   Exercise Vital Sign    Days of Exercise per Week: 0 days    Minutes of Exercise per Session: 0 min  Stress: No Stress Concern Present (12/30/2020)   [place]    Feeling of Stress : Only a little  Social Connections: Socially Isolated (12/30/2020)   Social Connection and Isolation Panel [NHANES]    Frequency of Communication with Friends and Family: More than three times a week    Frequency of Social Gatherings with Friends and Family: More than three times a week    Attends Religious Services: Never    Marine scientist or Organizations: No    Attends Archivist Meetings: Never    Marital Status: Widowed     Family History: The patient's family history includes Alzheimer's disease in her sister; Aneurysm in her mother; Cancer in her brother, brother, father, and sister; Heart disease in her sister; Hip fracture in her sister; Hypertension in her mother; Stroke in her mother.  ROS:   Pertinent items noted in HPI and remainder of comprehensive ROS otherwise negative.   EKGs/Labs/Other Studies Reviewed:    The following studies were reviewed today:  R/L heart cath 10/02/19: IMPRESSION: Ms. Fazzone has severe nonischemic cardiomyopathy with incidentally noted ostial/proximal first  diagonal branch lesion and a small to medium sized vessel. I think this is true/true and unrelated and probably does not require intervention. Given  her severe desaturation and markedly depressed cardiac index we will keep her in the hospital on the heart failure service for guideline directed optimal medical therapy/optimization. She is already on Coreg and would benefit from additional diuretics and initiation of Entresto. The sheaths were removed and a TR band was placed on the right wrist to achieve patent hemostasis. The patient left lab in stable condition. I have communicated her clinical status to Dr. Haroldine Laws who service she will be on.  EKG: Normal sinus rhythm at 75, low voltage QRS-personally reviewed  Recent Labs: 11/07/2021: Hemoglobin 13.6; Platelets 321; TSH 3.060 12/29/2021: ALT 23; BUN 24; Creatinine, Ser 1.41; Potassium 4.6; Sodium 140  Recent Lipid Panel    Component Value Date/Time   CHOL 163 07/26/2019 1454   CHOL 154 07/28/2012 1248   TRIG 200 (H) 07/26/2019 1454   TRIG 142 05/11/2014 1559   TRIG 120 07/28/2012 1248   HDL 43 07/26/2019 1454   HDL 45 05/11/2014 1559   HDL 44 07/28/2012 1248   CHOLHDL 3.8 07/26/2019 1454   LDLCALC 86 07/26/2019 1454   LDLCALC 48 11/15/2012 1255   LDLCALC 86 07/28/2012 1248    Physical Exam:    VS:  BP 110/74   Pulse 74   Ht 4' 11"$  (1.499 m)   Wt 235 lb (106.6 kg)   SpO2 100%   BMI 47.46 kg/m     Wt Readings from Last 3 Encounters:  04/16/22 235 lb (106.6 kg)  12/29/21 235 lb (106.6 kg)  11/28/21 234 lb (106.1 kg)     GEN: morbidly obese female in NAD, in wheelchair HEENT: Normal NECK: No JVD; No carotid bruits LYMPHATICS: No lymphadenopathy CARDIAC: RRR, no murmurs, rubs, gallops RESPIRATORY: Decreased breath sounds and dullness at the bases of the lungs ABDOMEN: Soft, non-tender, non-distended MUSCULOSKELETAL: 1+ bilateral nonpitting edema SKIN: Warm and dry NEUROLOGIC:  Alert and oriented x 3 PSYCHIATRIC:   Normal affect   ASSESSMENT:    Acute on chronic systolic congestive heart failure Hypertension Morbid obesity On chronic palliative care Dyslipidemia  PLAN:    In order of problems listed above:  NICM Chronic systolic and diastolic heart failure LVEF 20-25%, grade 3 DD - > improved to 45 to 50% (02/2021) -> 35-40% (03/2022) Normal coronaries by heart cath (2021) Maintained on entresto, metoprolol, torsemide and aldactone Not a candidate for SGLT2 inhibitor given recent UTIs/urosepsis She was placed on palliative care  - seen intermittently, gets home health Appears volume overloaded on exam today.  Noted recent decline in LVEF. Plan increase Entresto to the highest dose  Hypertension There is blood pressure room today for medication adjustment.  Will plan to increase her Entresto to the high dose  Hyperlipidemia No recent lipids in our system, followed by PCP  Follow up in 3 months in [place].   Medication Adjustments/Labs and Tests Ordered: Current medicines are reviewed at length with the patient today.  Concerns regarding medicines are outlined above.  Orders Placed This Encounter  Procedures   Basic metabolic panel   Brain natriuretic peptide   EKG 12-Lead   Meds ordered this encounter  Medications   sacubitril-valsartan (ENTRESTO) 97-103 MG    Sig: Take 1 tablet by mouth 2 (two) times daily.    Dispense:  60 tablet    Refill:  11   Pixie Casino, MD, [facility], [place] Director of the Advanced Lipid Disorders &  Cardiovascular Risk Reduction Clinic Diplomate of the [employer] of  Clinical Lipidology Attending Cardiologist  Direct Dial: [phone]  Fax: [phone]  Website:  [email or web address]

## 2022-04-16 NOTE — Patient Instructions (Signed)
Medication Instructions:  INCREASE entresto to 97/134m twice daily  *If you need a refill on your cardiac medications before your next appointment, please call your pharmacy*   Lab Work: Non-Fasting BMET and BNP in 2 weeks  If you have labs (blood work) drawn today and your tests are completely normal, you will receive your results only by: [place](if you have MyChart) OR A paper copy in the mail If you have any lab test that is abnormal or we need to change your treatment, we will call you to review the results.    Follow-Up: [facility] and [facility] our [facility].  As part of our continuing mission to provide you with exceptional heart care, we have created designated Provider Care Teams.  These Care Teams include your primary Cardiologist (physician) and Advanced Practice Providers (APPs -  Physician Assistants and Nurse Practitioners) who all work together to provide you with the care you need, when you need it.  We recommend signing up for the patient portal called "MyChart".  Sign up information is provided on this After Visit Summary.  MyChart is used to connect with patients for Virtual Visits (Telemedicine).  Patients are able to view lab/test results, encounter notes, upcoming appointments, etc.  Non-urgent messages can be sent to your provider as well.   To learn more about what you can do with MyChart, go to [email or web address]    Your next appointment:    [facility]

## 2022-04-20 ENCOUNTER — Ambulatory Visit: Payer: 59 | Admitting: Family

## 2022-04-21 ENCOUNTER — Encounter: Payer: Self-pay | Admitting: Family

## 2022-04-29 ENCOUNTER — Other Ambulatory Visit: Payer: Self-pay | Admitting: Family

## 2022-04-29 DIAGNOSIS — Z79899 Other long term (current) drug therapy: Secondary | ICD-10-CM

## 2022-04-29 DIAGNOSIS — G8929 Other chronic pain: Secondary | ICD-10-CM

## 2022-04-29 DIAGNOSIS — M159 Polyosteoarthritis, unspecified: Secondary | ICD-10-CM

## 2022-04-30 ENCOUNTER — Other Ambulatory Visit (HOSPITAL_COMMUNITY)
Admission: RE | Admit: 2022-04-30 | Discharge: 2022-04-30 | Disposition: A | Discharge status: Home / Self Care | Payer: 59 | Source: Ambulatory Visit | Attending: Internal Medicine | Admitting: Internal Medicine

## 2022-04-30 ENCOUNTER — Other Ambulatory Visit: Payer: Self-pay | Admitting: Cardiovascular Disease

## 2022-04-30 ENCOUNTER — Other Ambulatory Visit: Payer: Self-pay | Admitting: Family

## 2022-04-30 ENCOUNTER — Encounter: Payer: Self-pay | Admitting: Internal Medicine

## 2022-04-30 DIAGNOSIS — I5043 Acute on chronic combined systolic (congestive) and diastolic (congestive) heart failure: Secondary | ICD-10-CM | POA: Insufficient documentation

## 2022-04-30 LAB — BASIC METABOLIC PANEL
Anion gap: 13 (ref 5–15)
BUN: 25 mg/dL — ABNORMAL HIGH (ref 8–23)
CO2: 24 mmol/L (ref 22–32)
Calcium: 10.2 mg/dL (ref 8.9–10.3)
Chloride: 99 mmol/L (ref 98–111)
Creatinine, Ser: 1.23 mg/dL — ABNORMAL HIGH (ref 0.44–1.00)
GFR, Estimated: 44 mL/min — ABNORMAL LOW (ref >60)
Glucose, Bld: 143 mg/dL — ABNORMAL HIGH (ref 70–99)
Potassium: 3.8 mmol/L (ref 3.5–5.1)
Sodium: 136 mmol/L (ref 135–145)

## 2022-04-30 LAB — BRAIN NATRIURETIC PEPTIDE: B Natriuretic Peptide: 51 pg/mL (ref 0.0–100.0)

## 2022-05-05 ENCOUNTER — Ambulatory Visit (INDEPENDENT_AMBULATORY_CARE_PROVIDER_SITE_OTHER): Payer: 59 | Admitting: Family

## 2022-05-05 ENCOUNTER — Encounter: Payer: Self-pay | Admitting: Family

## 2022-05-05 ENCOUNTER — Other Ambulatory Visit: Payer: Medicaid Other

## 2022-05-05 VITALS — BP 112/74 | HR 61 | Temp 99.3°F | Ht 59.0 in

## 2022-05-05 DIAGNOSIS — M545 Low back pain, unspecified: Secondary | ICD-10-CM

## 2022-05-05 DIAGNOSIS — E039 Hypothyroidism, unspecified: Secondary | ICD-10-CM | POA: Diagnosis not present

## 2022-05-05 DIAGNOSIS — Z0001 Encounter for general adult medical examination with abnormal findings: Secondary | ICD-10-CM

## 2022-05-05 DIAGNOSIS — M549 Dorsalgia, unspecified: Secondary | ICD-10-CM | POA: Diagnosis not present

## 2022-05-05 DIAGNOSIS — I5022 Chronic systolic (congestive) heart failure: Secondary | ICD-10-CM

## 2022-05-05 DIAGNOSIS — N1832 Chronic kidney disease, stage 3b: Secondary | ICD-10-CM

## 2022-05-05 DIAGNOSIS — Z0000 Encounter for general adult medical examination without abnormal findings: Secondary | ICD-10-CM | POA: Diagnosis not present

## 2022-05-05 DIAGNOSIS — M159 Polyosteoarthritis, unspecified: Secondary | ICD-10-CM | POA: Diagnosis not present

## 2022-05-05 DIAGNOSIS — M810 Age-related osteoporosis without current pathological fracture: Secondary | ICD-10-CM | POA: Diagnosis not present

## 2022-05-05 DIAGNOSIS — F1320 Sedative, hypnotic or anxiolytic dependence, uncomplicated: Secondary | ICD-10-CM | POA: Diagnosis not present

## 2022-05-05 DIAGNOSIS — Z6841 Body Mass Index (BMI) 40.0 and over, adult: Secondary | ICD-10-CM

## 2022-05-05 DIAGNOSIS — J449 Chronic obstructive pulmonary disease, unspecified: Secondary | ICD-10-CM | POA: Diagnosis not present

## 2022-05-05 DIAGNOSIS — Z79891 Long term (current) use of opiate analgesic: Secondary | ICD-10-CM | POA: Diagnosis not present

## 2022-05-05 DIAGNOSIS — F331 Major depressive disorder, recurrent, moderate: Secondary | ICD-10-CM

## 2022-05-05 DIAGNOSIS — N3000 Acute cystitis without hematuria: Secondary | ICD-10-CM | POA: Diagnosis not present

## 2022-05-05 DIAGNOSIS — I130 Hypertensive heart and chronic kidney disease with heart failure and stage 1 through stage 4 chronic kidney disease, or unspecified chronic kidney disease: Secondary | ICD-10-CM | POA: Diagnosis not present

## 2022-05-05 DIAGNOSIS — G8929 Other chronic pain: Secondary | ICD-10-CM

## 2022-05-05 DIAGNOSIS — R509 Fever, unspecified: Secondary | ICD-10-CM | POA: Diagnosis not present

## 2022-05-05 DIAGNOSIS — E782 Mixed hyperlipidemia: Secondary | ICD-10-CM

## 2022-05-05 DIAGNOSIS — Z0289 Encounter for other administrative examinations: Secondary | ICD-10-CM | POA: Diagnosis not present

## 2022-05-05 DIAGNOSIS — F411 Generalized anxiety disorder: Secondary | ICD-10-CM

## 2022-05-05 DIAGNOSIS — Z79899 Other long term (current) drug therapy: Secondary | ICD-10-CM | POA: Diagnosis not present

## 2022-05-05 DIAGNOSIS — I10 Essential (primary) hypertension: Secondary | ICD-10-CM

## 2022-05-05 LAB — URINALYSIS, COMPLETE
Bilirubin, UA: NEGATIVE
Glucose, UA: NEGATIVE
Ketones, UA: NEGATIVE
Nitrite, UA: NEGATIVE
Protein,UA: NEGATIVE
RBC, UA: NEGATIVE
Specific Gravity, UA: 1.01 (ref 1.005–1.030)
Urobilinogen, Ur: 0.2 mg/dL (ref 0.2–1.0)
pH, UA: 6 (ref 5.0–7.5)

## 2022-05-05 LAB — MICROSCOPIC EXAMINATION
RBC, Urine: NONE SEEN /hpf (ref 0–2)
Renal Epithel, UA: NONE SEEN /hpf

## 2022-05-05 MED ORDER — PANTOPRAZOLE SODIUM 40 MG PO TBEC: 40.0000 mg | DELAYED_RELEASE_TABLET | Freq: Two times a day (BID) | ORAL | 1 refills | Status: DC

## 2022-05-05 MED ORDER — TRAMADOL HCL 50 MG PO TABS: ORAL_TABLET | ORAL | 2 refills | Status: DC

## 2022-05-05 MED ORDER — CEPHALEXIN 500 MG PO CAPS: 500.0000 mg | ORAL_CAPSULE | Freq: Two times a day (BID) | ORAL | 0 refills | Status: DC

## 2022-05-05 MED ORDER — DENOSUMAB 60 MG/ML ~~LOC~~ SOSY
60.0000 mg | PREFILLED_SYRINGE | Freq: Once | SUBCUTANEOUS | Status: AC
  Administered 2022-05-05: 60 mg via SUBCUTANEOUS

## 2022-05-05 MED ORDER — ALPRAZOLAM 0.5 MG PO TABS: 0.5000 mg | ORAL_TABLET | Freq: Two times a day (BID) | ORAL | 2 refills | Status: DC | PRN

## 2022-05-05 NOTE — Progress Notes (Signed)
Subjective:    Patient ID: Sheila [name], female    DOB: [birth year], 82 y.o.   MRN: [id number]  Chief Complaint  Patient presents with   Medical Management of Chronic Issues   Fever   Pt presents to the office today for CPE and chronic follow up. She is followed by Cardiologists every 3 months for CAD and CHF. She is currently has an aid and family that stays with her 24/7.   She reports she is having a hard time walking long distances. She has to use a rolling walker at all times, however, she can not walk more than a few feet without having to relax.    She saw an Endocrinologists for  hypercalcemia & hyperparathyroidism. She is followed by Dermatologists for skin lesion.    She has COPD and states her breathing is stable as long as she it sitting and does not have to wear a mask. States she gets SOB with exertion.    She has CKD and tries to limit NSAID's. Fever  This is a new problem. The current episode started yesterday. The problem occurs constantly. The problem has been unchanged. The maximum temperature noted was 99 to 99.9 F. Pertinent negatives include no congestion or coughing. Associated symptoms comments: Urinary frequency and dysuria .  Hypertension This is a chronic problem. The current episode started more than 1 year ago. The problem is unchanged. The problem is controlled. Associated symptoms include anxiety, malaise/fatigue and shortness of breath. Pertinent negatives include no peripheral edema. Risk factors for coronary artery disease include obesity, dyslipidemia and sedentary lifestyle. The current treatment provides moderate improvement. Identifiable causes of hypertension include a thyroid problem.  Congestive Heart Failure Presents for follow-up visit. Associated symptoms include fatigue and shortness of breath. Pertinent negatives include no edema. The symptoms have been stable.  Thyroid Problem Presents for follow-up visit. Symptoms include anxiety and fatigue.  Patient reports no constipation or depressed mood. The symptoms have been stable. Her past medical history is significant for hyperlipidemia.  Arthritis Presents for follow-up visit. She complains of pain and stiffness. Affected locations include the right knee, left knee, left MCP and right MCP. Her pain is at a severity of 8/10. Associated symptoms include fatigue and fever.  Depression        This is a chronic problem.  The current episode started more than 1 year ago.   The onset quality is gradual.   The problem occurs intermittently.  Associated symptoms include fatigue and restlessness.  Associated symptoms include no helplessness, no hopelessness and not sad.  Past treatments include SSRIs - Selective serotonin reuptake inhibitors.  Past medical history includes thyroid problem and anxiety.   Hyperlipidemia This is a chronic problem. The current episode started more than 1 year ago. Exacerbating diseases include obesity. Associated symptoms include shortness of breath. The current treatment provides moderate improvement of lipids. Risk factors for coronary artery disease include dyslipidemia, diabetes mellitus, hypertension, a sedentary lifestyle and post-menopausal.  Anxiety Presents for follow-up visit. Symptoms include excessive worry, nervous/anxious behavior, restlessness and shortness of breath. Patient reports no depressed mood. Symptoms occur most days. The severity of symptoms is moderate.    Back Pain This is a chronic problem. The current episode started more than 1 year ago. The problem occurs intermittently. The problem has been waxing and waning since onset. The pain is present in the lumbar spine. The quality of the pain is described as aching. The pain is at a severity of 8/10.  The pain is moderate. Associated symptoms include a fever.  Gastroesophageal Reflux She complains of belching and heartburn. She reports no coughing. This is a chronic problem. The current episode started  more than 1 year ago. The problem occurs occasionally. Associated symptoms include fatigue. She has tried a PPI for the symptoms. The treatment provided mild relief.   Current opioids rx- Ultram 50 mg #45 and Xanax 0.5 mg #60 Effectiveness of current meds-stable Adverse reactions from pain meds-none Morphine equivalent- 20   Pill count performed-No Last drug screen - 05/15/21 ( high risk q75m moderate risk q649mlow risk yearly ) Urine drug screen today- No [place]             If yes were their any concerning findings? - none   Pain contract signed on: 04/25/21   Review of Systems  Constitutional:  Positive for fatigue, fever and malaise/fatigue.  HENT:  Negative for congestion.   Respiratory:  Positive for shortness of breath. Negative for cough.   Gastrointestinal:  Positive for heartburn. Negative for constipation.  Musculoskeletal:  Positive for arthritis, back pain and stiffness.  Psychiatric/Behavioral:  Positive for depression. The patient is nervous/anxious.   All other systems reviewed and are negative.  Family History  Problem Relation Age of Onset   Aneurysm Mother    Stroke Mother    Hypertension Mother    Cancer Father        lung   Heart disease Sister    Hip fracture Sister    Cancer Brother    Alzheimer's disease Sister    Cancer Sister        skin, uterine   Cancer Brother    Social History   Socioeconomic History   Marital status: Widowed    Spouse name: Not on file   Number of children: 2   Years of education: 0   Highest education level: Never attended school  Occupational History   Occupation: retired  Tobacco Use   Smoking status: Former    Types: Cigarettes    Quit date: 02/24/1968    Years since quitting: 54.2   Smokeless tobacco: Current    Types: Snuff   Tobacco comments:    quit yrs and yrs ago when she was very young  [employer] planner Vaping Use: Never used  Substance and Sexual Activity   Alcohol use: No   Drug  use: No   Sexual activity: Not Currently    Birth control/protection: Post-menopausal  Other Topics Concern   Not on file  Social History Narrative   Lives alone, but has 24 hour caregiver, palliative care   Daughter lives nearby and helps her with paying bills, shopping, etc.   Social Determinants of Health   Financial Resource Strain: [place](12/30/2020)   Overall Financial Resource Strain (CARDIA)    Difficulty of Paying Living Expenses: Not very hard  Food Insecurity: No Food Insecurity (12/30/2020)   Hunger Vital Sign    Worried About Running Out of Food in the Last Year: Never true    [place] the Last Year: Never true  Transportation Needs: No Transportation Needs (12/30/2020)   PRAPARE - TrHydrologistMedical): No    Lack of Transportation (Non-Medical): No  Physical Activity: Inactive (12/30/2020)   Exercise Vital Sign    Days of Exercise per Week: 0 days    Minutes of Exercise per Session: 0 min  Stress: No  Stress Concern Present (12/30/2020)   [place]    Feeling of Stress : Only a little  Social Connections: Socially Isolated (12/30/2020)   Social Connection and Isolation Panel [NHANES]    Frequency of Communication with Friends and Family: More than three times a week    Frequency of Social Gatherings with Friends and Family: More than three times a week    Attends Religious Services: Never    Marine scientist or Organizations: No    Attends Archivist Meetings: Never    Marital Status: Widowed       Objective:   Physical Exam Vitals reviewed.  Constitutional:      General: She is not in acute distress.    Appearance: She is well-developed. She is obese.  HENT:     Head: Normocephalic and atraumatic.     Right Ear: Tympanic membrane normal.     Left Ear: Tympanic membrane normal.  Eyes:     Pupils: Pupils are equal, round, and reactive  to light.  Neck:     Thyroid: No thyromegaly.  Cardiovascular:     Rate and Rhythm: Normal rate and regular rhythm.     Heart sounds: Normal heart sounds. No murmur heard. Pulmonary:     Effort: Pulmonary effort is normal. No respiratory distress.     Breath sounds: Normal breath sounds. No wheezing.     Comments: Diminished  Abdominal:     General: Bowel sounds are normal. There is no distension.     Palpations: Abdomen is soft.     Tenderness: There is no abdominal tenderness.  Musculoskeletal:        General: No tenderness.     Cervical back: Normal range of motion and neck supple.     Comments: Pain in lumbar with flexion and extension  Skin:    General: Skin is warm and dry.  Neurological:     Mental Status: She is alert and oriented to person, place, and time.     Cranial Nerves: No cranial nerve deficit.     Deep Tendon Reflexes: Reflexes are normal and symmetric.  Psychiatric:        Behavior: Behavior normal.        Thought Content: Thought content normal.        Judgment: Judgment normal.       Pulse 61   Temp 99.3 F (37.4 C) (Temporal)   Ht '4\' 11"'$  (1.499 m)   SpO2 92%   BMI 47.46 kg/m      Assessment & Plan:  Sheila [name] comes in today with chief complaint of Medical Management of Chronic Issues and Fever   Diagnosis and orders addressed:  1. Fever, unspecified fever cause - Urinalysis, Complete - Urine Culture - CMP14+EGFR - CBC with Differential/Platelet  2. Osteoporosis, unspecified osteoporosis type, unspecified pathological fracture presence - denosumab (PROLIA) injection 60 mg - CMP14+EGFR - CBC with Differential/Platelet  3. Hypothyroidism, unspecified type - CMP14+EGFR - CBC with Differential/Platelet  4. GAD (generalized anxiety disorder) - CMP14+EGFR - CBC with Differential/Platelet - ALPRAZolam (XANAX) 0.5 MG tablet; Take 1 tablet (0.5 mg total) by mouth 2 (two) times daily as needed.  Dispense: 60 tablet; Refill: 2  5.  Mixed hyperlipidemia - CMP14+EGFR - CBC with Differential/Platelet  6. Essential hypertension, benign - CMP14+EGFR - CBC with Differential/Platelet  7. Moderate episode of recurrent major depressive disorder (HCC)  - CMP14+EGFR - CBC with Differential/Platelet  8. Morbid  obesity ([place]) - CMP14+EGFR - CBC with Differential/Platelet  9. Benzodiazepine dependence (HCC) - CMP14+EGFR - CBC with Differential/Platelet - ALPRAZolam (XANAX) 0.5 MG tablet; Take 1 tablet (0.5 mg total) by mouth 2 (two) times daily as needed.  Dispense: 60 tablet; Refill: 2 - ToxASSURE Select 13 (MW), Urine  10. Pain medication agreement signed - CMP14+EGFR - CBC with Differential/Platelet - traMADol (ULTRAM) 50 MG tablet; TAKE TWO TABLETS EVERY TWELVE HOURS AS NEEDED FOR PAIN  Dispense: 45 tablet; Refill: 2 - ToxASSURE Select 13 (MW), Urine  11. Chronic bilateral low back pain, unspecified whether sciatica present - CMP14+EGFR - CBC with Differential/Platelet - traMADol (ULTRAM) 50 MG tablet; TAKE TWO TABLETS EVERY TWELVE HOURS AS NEEDED FOR PAIN  Dispense: 45 tablet; Refill: 2 - Ambulatory referral to Physical Therapy  12. Primary osteoarthritis involving multiple joints - CMP14+EGFR - CBC with Differential/Platelet - traMADol (ULTRAM) 50 MG tablet; TAKE TWO TABLETS EVERY TWELVE HOURS AS NEEDED FOR PAIN  Dispense: 45 tablet; Refill: 2 - Ambulatory referral to Physical Therapy  13. Chronic obstructive pulmonary disease, unspecified COPD type (HCC) - CMP14+EGFR - CBC with Differential/Platelet - traMADol (ULTRAM) 50 MG tablet; TAKE TWO TABLETS EVERY TWELVE HOURS AS NEEDED FOR PAIN  Dispense: 45 tablet; Refill: 2  14. Chronic systolic CHF (congestive heart failure) (HCC) - CMP14+EGFR - CBC with Differential/Platelet  15. Stage 3b chronic kidney disease (HCC) - CMP14+EGFR - CBC with Differential/Platelet  16. Annual physical exam - CMP14+EGFR - CBC with Differential/Platelet - Lipid panel -  TSH  17. Acute cystitis without hematuria Force fluids AZO over the counter X2 days RTO prn Culture pending - cephALEXin (KEFLEX) 500 MG capsule; Take 1 capsule (500 mg total) by mouth 2 (two) times daily.  Dispense: 14 capsule; Refill: 0  18. Controlled substance agreement signed - ALPRAZolam (XANAX) 0.5 MG tablet; Take 1 tablet (0.5 mg total) by mouth 2 (two) times daily as needed.  Dispense: 60 tablet; Refill: 2 - traMADol (ULTRAM) 50 MG tablet; TAKE TWO TABLETS EVERY TWELVE HOURS AS NEEDED FOR PAIN  Dispense: 45 tablet; Refill: 2  19. Chronic bilateral back pain, unspecified back location - traMADol (ULTRAM) 50 MG tablet; TAKE TWO TABLETS EVERY TWELVE HOURS AS NEEDED FOR PAIN  Dispense: 45 tablet; Refill: 2 - Ambulatory referral to Physical Therapy   Labs pending Patient reviewed in [place] controlled database, no flags noted. Contract and drug screen are up to date.  Continue current medications Increased Protonix to 40 mg BID from 20 mg BID Keflex started today  Referral to PT placed Health Maintenance reviewed Diet and exercise encouraged  Follow up plan: 3 months    [name], FNP

## 2022-05-05 NOTE — Patient Instructions (Signed)
Health Maintenance After Age 81 After age 81, you are at a higher risk for certain long-term diseases and infections as well as injuries from falls. Falls are a major cause of broken bones and head injuries in people who are older than age 81. Getting regular preventive care can help to keep you healthy and well. Preventive care includes getting regular testing and making lifestyle changes as recommended by your health care provider. Talk with your health care provider about: Which screenings and tests you should have. A screening is a test that checks for a disease when you have no symptoms. A diet and exercise plan that is right for you. What should I know about screenings and tests to prevent falls? Screening and testing are the best ways to find a health problem early. Early diagnosis and treatment give you the best chance of managing medical conditions that are common after age 81. Certain conditions and lifestyle choices may make you more likely to have a fall. Your health care provider may recommend: Regular vision checks. Poor vision and conditions such as cataracts can make you more likely to have a fall. If you wear glasses, make sure to get your prescription updated if your vision changes. Medicine review. Work with your health care provider to regularly review all of the medicines you are taking, including over-the-counter medicines. Ask your health care provider about any side effects that may make you more likely to have a fall. Tell your health care provider if any medicines that you take make you feel dizzy or sleepy. Strength and balance checks. Your health care provider may recommend certain tests to check your strength and balance while standing, walking, or changing positions. Foot health exam. Foot pain and numbness, as well as not wearing proper footwear, can make you more likely to have a fall. Screenings, including: Osteoporosis screening. Osteoporosis is a condition that causes  the bones to get weaker and break more easily. Blood pressure screening. Blood pressure changes and medicines to control blood pressure can make you feel dizzy. Depression screening. You may be more likely to have a fall if you have a fear of falling, feel depressed, or feel unable to do activities that you used to do. Alcohol use screening. Using too much alcohol can affect your balance and may make you more likely to have a fall. Follow these instructions at home: Lifestyle Do not drink alcohol if: Your health care provider tells you not to drink. If you drink alcohol: Limit how much you have to: 0-1 drink a day for women. 0-2 drinks a day for men. Know how much alcohol is in your drink. In the [place] one drink equals one 12 oz bottle of beer (355 mL), one 5 oz glass of wine (148 mL), or one 1 oz glass of hard liquor (44 mL). Do not use any products that contain nicotine or tobacco. These products include cigarettes, chewing tobacco, and vaping devices, such as e-cigarettes. If you need help quitting, ask your health care provider. Activity  Follow a regular exercise program to stay fit. This will help you maintain your balance. Ask your health care provider what types of exercise are appropriate for you. If you need a cane or walker, use it as recommended by your health care provider. Wear supportive shoes that have nonskid soles. Safety  Remove any tripping hazards, such as rugs, cords, and clutter. Install safety equipment such as grab bars in bathrooms and safety rails on stairs. Keep rooms and walkways   well-lit. General instructions Talk with your health care provider about your risks for falling. Tell your health care provider if: You fall. Be sure to tell your health care provider about all falls, even ones that seem minor. You feel dizzy, tiredness (fatigue), or off-balance. Take over-the-counter and prescription medicines only as told by your health care provider. These include  supplements. Eat a healthy diet and maintain a healthy weight. A healthy diet includes low-fat dairy products, low-fat (lean) meats, and fiber from whole grains, beans, and lots of fruits and vegetables. Stay current with your vaccines. Schedule regular health, dental, and eye exams. Summary Having a healthy lifestyle and getting preventive care can help to protect your health and wellness after age 81. Screening and testing are the best way to find a health problem early and help you avoid having a fall. Early diagnosis and treatment give you the best chance for managing medical conditions that are more common for people who are older than age 81. Falls are a major cause of broken bones and head injuries in people who are older than age 81. Take precautions to prevent a fall at home. Work with your health care provider to learn what changes you can make to improve your health and wellness and to prevent falls. This information is not intended to replace advice given to you by your health care provider. Make sure you discuss any questions you have with your health care provider. Document Revised: 07/01/2020 Document Reviewed: 07/01/2020 Elsevier Patient Education  2023 [employer]  

## 2022-05-06 LAB — CBC WITH DIFFERENTIAL/PLATELET
Basophils Absolute: 0.1 10*3/uL (ref 0.0–0.2)
Basos: 1 %
EOS (ABSOLUTE): 0.2 10*3/uL (ref 0.0–0.4)
Eos: 2 %
Hematocrit: 38.3 % (ref 34.0–46.6)
Hemoglobin: 12.7 g/dL (ref 11.1–15.9)
Immature Grans (Abs): 0 10*3/uL (ref 0.0–0.1)
Immature Granulocytes: 0 %
Lymphocytes Absolute: 2.8 10*3/uL (ref 0.7–3.1)
Lymphs: 23 %
MCH: 28.3 pg (ref 26.6–33.0)
MCHC: 33.2 g/dL (ref 31.5–35.7)
MCV: 86 fL (ref 79–97)
Monocytes Absolute: 0.9 10*3/uL (ref 0.1–0.9)
Monocytes: 7 %
Neutrophils Absolute: 8.5 10*3/uL — ABNORMAL HIGH (ref 1.4–7.0)
Neutrophils: 67 %
Platelets: 252 10*3/uL (ref 150–450)
RBC: 4.48 x10E6/uL (ref 3.77–5.28)
RDW: 13.4 % (ref 11.7–15.4)
WBC: 12.6 10*3/uL — ABNORMAL HIGH (ref 3.4–10.8)

## 2022-05-06 LAB — TSH: TSH: 3.3 u[IU]/mL (ref 0.450–4.500)

## 2022-05-06 LAB — CMP14+EGFR
ALT: 17 IU/L (ref 0–32)
AST: 21 IU/L (ref 0–40)
Albumin/Globulin Ratio: 1.9 (ref 1.2–2.2)
Albumin: 4.7 g/dL (ref 3.8–4.8)
Alkaline Phosphatase: 51 IU/L (ref 44–121)
BUN/Creatinine Ratio: 18 (ref 12–28)
BUN: 24 mg/dL (ref 8–27)
Bilirubin Total: 0.9 mg/dL (ref 0.0–1.2)
CO2: 23 mmol/L (ref 20–29)
Calcium: 10.8 mg/dL — ABNORMAL HIGH (ref 8.7–10.3)
Chloride: 98 mmol/L (ref 96–106)
Creatinine, Ser: 1.31 mg/dL — ABNORMAL HIGH (ref 0.57–1.00)
Globulin, Total: 2.5 g/dL (ref 1.5–4.5)
Glucose: 94 mg/dL (ref 70–99)
Potassium: 4.1 mmol/L (ref 3.5–5.2)
Sodium: 140 mmol/L (ref 134–144)
Total Protein: 7.2 g/dL (ref 6.0–8.5)
eGFR: 41 mL/min/{1.73_m2} — ABNORMAL LOW (ref >59)

## 2022-05-06 LAB — LIPID PANEL
Chol/HDL Ratio: 4.1 ratio (ref 0.0–4.4)
Cholesterol, Total: 192 mg/dL (ref 100–199)
HDL: 47 mg/dL (ref >39)
LDL Chol Calc (NIH): 95 mg/dL (ref 0–99)
Triglycerides: 301 mg/dL — ABNORMAL HIGH (ref 0–149)
VLDL Cholesterol Cal: 50 mg/dL — ABNORMAL HIGH (ref 5–40)

## 2022-05-09 LAB — TOXASSURE SELECT 13 (MW), URINE

## 2022-05-10 LAB — URINE CULTURE

## 2022-05-12 ENCOUNTER — Other Ambulatory Visit: Payer: Self-pay | Admitting: Family

## 2022-05-12 DIAGNOSIS — L282 Other prurigo: Secondary | ICD-10-CM

## 2022-05-13 ENCOUNTER — Ambulatory Visit (INDEPENDENT_AMBULATORY_CARE_PROVIDER_SITE_OTHER): Payer: 59

## 2022-05-13 VITALS — Ht 59.0 in | Wt 235.0 lb

## 2022-05-13 DIAGNOSIS — Z0000 Encounter for general adult medical examination without abnormal findings: Secondary | ICD-10-CM

## 2022-05-13 DIAGNOSIS — Z780 Asymptomatic menopausal state: Secondary | ICD-10-CM

## 2022-05-13 NOTE — Patient Instructions (Signed)
Sheila [name] , Thank you for taking time to come for your Medicare Wellness Visit. I appreciate your ongoing commitment to your health goals. Please review the following plan we discussed and let me know if I can assist you in the future.   These are the goals we discussed:  Goals       Client will talk with LCSW about social work needs of client (pt-stated)      Current Barriers:  Ambulation challenges COPD Breathing challenges in client with Chronic Diagnoses of HTN, DJD, HLD, GAD, Osteoarthritis,and COPD  Clinical Social Work Clinical Goal(s):  LCSW will talk with client in next 30 days to discuss the social work needs of client at that time  Interventions: Discussed with client CCM program services Talked with client about [place] support Talked with client about ambulation needs of client (uses walker to ambulate) Talked with client about client stress or anxiety issues Talked with client about her breathing challenges and her use of nebulizer machine  Patient Self Care Activities:  Self administers medications as prescribed Attends all scheduled provider appointments  Patient Self Care Deficits Unable to perform IADLs independently  Initial goal documentation       DIET - INCREASE WATER INTAKE      Try to drink 6-8 glasses of water daily      Patient Stated (pt-stated)      Set an alarm or ask aid to help remember to take evening medications each day.         This is a list of the screening recommended for you and due dates:  Health Maintenance  Topic Date Due   DEXA scan (bone density measurement)  12/23/2019   COVID-19 Vaccine (1) 07/06/2022*   Medicare Annual Wellness Visit  05/13/2023   Pneumonia Vaccine  Completed   Flu Shot  Completed   Zoster (Shingles) Vaccine  Completed   HPV Vaccine  Aged Out   DTaP/Tdap/Td vaccine  Discontinued  *Topic was postponed. The date shown is not the original due date.    Advanced directives: In Chart    Conditions/risks identified: Aim for 30 minutes of exercise or brisk walking, 6-8 glasses of water, and 5 servings of fruits and vegetables each day.   Next appointment: Follow up in one year for your annual wellness visit    Preventive Care 65 Years and Older, Female Preventive care refers to lifestyle choices and visits with your health care provider that can promote health and wellness. What does preventive care include? A yearly physical exam. This is also called an annual well check. Dental exams once or twice a year. Routine eye exams. Ask your health care provider how often you should have your eyes checked. Personal lifestyle choices, including: Daily care of your teeth and gums. Regular physical activity. Eating a healthy diet. Avoiding tobacco and drug use. Limiting alcohol use. Practicing safe sex. Taking low-dose aspirin every day. Taking vitamin and mineral supplements as recommended by your health care provider. What happens during an annual well check? The services and screenings done by your health care provider during your annual well check will depend on your age, overall health, lifestyle risk factors, and family history of disease. Counseling  Your health care provider may ask you questions about your: Alcohol use. Tobacco use. Drug use. Emotional well-being. Home and relationship well-being. Sexual activity. Eating habits. History of falls. Memory and ability to understand (cognition). Work and work Statistician. Reproductive health. Screening  You may have the following  tests or measurements: Height, weight, and BMI. Blood pressure. Lipid and cholesterol levels. These may be checked every 5 years, or more frequently if you are over 43 years old. Skin check. Lung cancer screening. You may have this screening every year starting at age 80 if you have a 30-pack-year history of smoking and currently smoke or have quit within the past 15 years. Fecal  occult blood test (FOBT) of the stool. You may have this test every year starting at age 2. Flexible sigmoidoscopy or colonoscopy. You may have a sigmoidoscopy every 5 years or a colonoscopy every 10 years starting at age 75. Hepatitis C blood test. Hepatitis B blood test. Sexually transmitted disease (STD) testing. Diabetes screening. This is done by checking your blood sugar (glucose) after you have not eaten for a while (fasting). You may have this done every 1-3 years. Bone density scan. This is done to screen for osteoporosis. You may have this done starting at age 35. Mammogram. This may be done every 1-2 years. Talk to your health care provider about how often you should have regular mammograms. Talk with your health care provider about your test results, treatment options, and if necessary, the need for more tests. Vaccines  Your health care provider may recommend certain vaccines, such as: Influenza vaccine. This is recommended every year. Tetanus, diphtheria, and acellular pertussis (Tdap, Td) vaccine. You may need a Td booster every 10 years. Zoster vaccine. You may need this after age 15. Pneumococcal 13-valent conjugate (PCV13) vaccine. One dose is recommended after age 56. Pneumococcal polysaccharide (PPSV23) vaccine. One dose is recommended after age 53. Talk to your health care provider about which screenings and vaccines you need and how often you need them. This information is not intended to replace advice given to you by your health care provider. Make sure you discuss any questions you have with your health care provider. Document Released: 03/08/2015 Document Revised: 10/30/2015 Document Reviewed: 12/11/2014 Elsevier Interactive Patient Education  2017 [place] Prevention in the Home Falls can cause injuries. They can happen to people of all ages. There are many things you can do to make your home safe and to help prevent falls. What can I do on the outside  of my home? Regularly fix the edges of walkways and driveways and fix any cracks. Remove anything that might make you trip as you walk through a door, such as a raised step or threshold. Trim any bushes or trees on the path to your home. Use bright outdoor lighting. Clear any walking paths of anything that might make someone trip, such as rocks or tools. Regularly check to see if handrails are loose or broken. Make sure that both sides of any steps have handrails. Any raised decks and porches should have guardrails on the edges. Have any leaves, snow, or ice cleared regularly. Use sand or salt on walking paths during winter. Clean up any spills in your garage right away. This includes oil or grease spills. What can I do in the bathroom? Use night lights. Install grab bars by the toilet and in the tub and shower. Do not use towel bars as grab bars. Use non-skid mats or decals in the tub or shower. If you need to sit down in the shower, use a plastic, non-slip stool. Keep the floor dry. Clean up any water that spills on the floor as soon as it happens. Remove soap buildup in the tub or shower regularly. Attach bath mats securely with  double-sided non-slip rug tape. Do not have throw rugs and other things on the floor that can make you trip. What can I do in the bedroom? Use night lights. Make sure that you have a light by your bed that is easy to reach. Do not use any sheets or blankets that are too big for your bed. They should not hang down onto the floor. Have a firm chair that has side arms. You can use this for support while you get dressed. Do not have throw rugs and other things on the floor that can make you trip. What can I do in the kitchen? Clean up any spills right away. Avoid walking on wet floors. Keep items that you use a lot in easy-to-reach places. If you need to reach something above you, use a strong step stool that has a grab bar. Keep electrical cords out of the  way. Do not use floor polish or wax that makes floors slippery. If you must use wax, use non-skid floor wax. Do not have throw rugs and other things on the floor that can make you trip. What can I do with my stairs? Do not leave any items on the stairs. Make sure that there are handrails on both sides of the stairs and use them. Fix handrails that are broken or loose. Make sure that handrails are as long as the stairways. Check any carpeting to make sure that it is firmly attached to the stairs. Fix any carpet that is loose or worn. Avoid having throw rugs at the top or bottom of the stairs. If you do have throw rugs, attach them to the floor with carpet tape. Make sure that you have a light switch at the top of the stairs and the bottom of the stairs. If you do not have them, ask someone to add them for you. What else can I do to help prevent falls? Wear shoes that: Do not have high heels. Have rubber bottoms. Are comfortable and fit you well. Are closed at the toe. Do not wear sandals. If you use a stepladder: Make sure that it is fully opened. Do not climb a closed stepladder. Make sure that both sides of the stepladder are locked into place. Ask someone to hold it for you, if possible. Clearly mark and make sure that you can see: Any grab bars or handrails. First and last steps. Where the edge of each step is. Use tools that help you move around (mobility aids) if they are needed. These include: Canes. Walkers. Scooters. Crutches. Turn on the lights when you go into a dark area. Replace any light bulbs as soon as they burn out. Set up your furniture so you have a clear path. Avoid moving your furniture around. If any of your floors are uneven, fix them. If there are any pets around you, be aware of where they are. Review your medicines with your doctor. Some medicines can make you feel dizzy. This can increase your chance of falling. Ask your doctor what other things that you can  do to help prevent falls. This information is not intended to replace advice given to you by your health care provider. Make sure you discuss any questions you have with your health care provider. Document Released: 12/06/2008 Document Revised: 07/18/2015 Document Reviewed: 03/16/2014 Elsevier Interactive Patient Education  2017 [employer].

## 2022-05-13 NOTE — Progress Notes (Signed)
Subjective:   Sheila [name] is a 82 y.o. female who presents for Medicare Annual (Subsequent) preventive examination. I connected with  [name] on 05/13/22 by a audio enabled telemedicine application and verified that I am speaking with the correct person using two identifiers.  Patient Location: Home  Provider Location: Home Office  I discussed the limitations of evaluation and management by telemedicine. The patient expressed understanding and agreed to proceed.  Review of Systems     Cardiac Risk Factors include: advanced age (>72men, >83 women);hypertension;dyslipidemia     Objective:    Today's Vitals   05/13/22 1205  Weight: 235 lb (106.6 kg)  Height: 4\' 11"  (1.499 m)   Body mass index is 47.46 kg/m.     05/13/2022   12:08 PM 06/27/2021    8:44 AM 06/26/2021    4:04 PM 03/06/2021   11:21 AM 12/30/2020    1:52 PM 12/28/2019    1:34 PM 10/24/2019    8:23 PM  Advanced Directives  Does Patient Have a Medical Advance Directive? Yes No Yes No Yes No No  Type of Paramedic of [place];Living will  Out of facility DNR (pink MOST or yellow form)  [place];Living will    Does patient want to make changes to medical advance directive? No - Patient declined        Copy of [place] in Chart? Yes - validated most recent copy scanned in chart (See row information)    Yes - validated most recent copy scanned in chart (See row information)    Would patient like information on creating a medical advance directive?  No - Patient declined  No - Patient declined  No - Patient declined     Current Medications (verified) Outpatient Encounter Medications as of 05/13/2022  Medication Sig   albuterol (PROVENTIL) (2.5 MG/3ML) 0.083% nebulizer solution NEBULIZE 1 VIAL EVERY 6 TO 8 HOURS AS NEEDED FOR WHEEZING OR SHORTNESS OF BREATH   ALPRAZolam (XANAX) 0.5 MG tablet Take 1 tablet (0.5 mg total) by mouth 2 (two) times daily as  needed.   BREZTRI AEROSPHERE 160-9-4.8 MCG/ACT AERO INHALE 2 PUFFS INTO LUNGS 2 TIMES A DAY   cephALEXin (KEFLEX) 500 MG capsule Take 1 capsule (500 mg total) by mouth 2 (two) times daily.   Cholecalciferol (VITAMIN D3) 25 MCG (1000 UT) CAPS Take 1,000 Units by mouth daily.    cinacalcet (SENSIPAR) 30 MG tablet Take 1 tablet (30 mg total) by mouth every other day.   CRANBERRY PO Take 1 tablet by mouth daily.   denosumab (PROLIA) 60 MG/ML SOSY injection Inject 60 mg into the skin every 6 (six) months.   fluticasone (CUTIVATE) 0.05 % cream Apply topically 2 (two) times daily as needed.   gabapentin (NEURONTIN) 300 MG capsule TAKE ONE CAPSULE THREE TIMES DAILY   hydrOXYzine (VISTARIL) 25 MG capsule TAKE ONE CAPSULE THREE TIMES DAILY AS NEEDED   ketoconazole (NIZORAL) 2 % cream APPLY TO THE AFFECTED AREA(S) DAILY AS DIRECTED   levocetirizine (XYZAL) 5 MG tablet TAKE ONE TABLET ONCE DAILY EVERY EVENING   levothyroxine (SYNTHROID) 100 MCG tablet TAKE ONE TABLET ONCE DAILY   LIVALO 4 MG TABS TAKE ONE TABLET ONCE DAILY   metoprolol tartrate (LOPRESSOR) 25 MG tablet TAKE 1/2 TABLET TWICE DAILY   nystatin (MYCOSTATIN/NYSTOP) powder Apply 1 Application topically 3 (three) times daily.   pantoprazole (PROTONIX) 40 MG tablet Take 1 tablet (40 mg total) by mouth 2 (  two) times daily.   PROLIA 60 MG/ML SOSY injection Inject into the skin.   sacubitril-valsartan (ENTRESTO) 97-103 MG Take 1 tablet by mouth 2 (two) times daily.   sertraline (ZOLOFT) 100 MG tablet TAKE ONE TABLET ONCE DAILY   spironolactone (ALDACTONE) 25 MG tablet TAKE ONE TABLET ONCE DAILY   Tetrahydrozoline HCl (VISINE OP) Place 1 drop into both eyes daily as needed (dry eyes/itching).   torsemide (DEMADEX) 20 MG tablet Take 1 tablet (20 mg total) by mouth 2 (two) times daily. TAKE 2 TABLETS DAILY AS DIRECTED Strength: 20 mg   traMADol (ULTRAM) 50 MG tablet TAKE TWO TABLETS EVERY TWELVE HOURS AS NEEDED FOR PAIN   triamcinolone cream (KENALOG)  0.1 % Apply topically 2 (two) times daily as needed.   No facility-administered encounter medications on file as of 05/13/2022.    Allergies (verified) Ibuprofen, Benadryl [diphenhydramine hcl], Lipitor [atorvastatin], Baclofen, Hydrocodone, Codeine, Morphine and related, and Tdap [tetanus-diphth-acell pertussis]   History: Past Medical History:  Diagnosis Date   Allergy    Bell's palsy    Cancer ([place])    skin CA on face   Cataract    Chest pain 07/2009   Myoveiw stress test negative.   Depression    Diverticulosis of colon    DJD (degenerative joint disease) of knee    Dyspnea    on exertion   Family history of adverse reaction to anesthesia    sister ha nausea/vomiting   Gastric ulcer with hemorrhage 01/14/2011   GERD (gastroesophageal reflux disease)    Headache    History of fractured pelvis    Hypercholesteremia    Hypertension    Hypothyroidism    LV dysfunction    UTI (lower urinary tract infection)    Past Surgical History:  Procedure Laterality Date   APPENDECTOMY     BREAST BIOPSY Left    ESOPHAGOGASTRODUODENOSCOPY  01/14/2011   Procedure: ESOPHAGOGASTRODUODENOSCOPY (EGD);  Surgeon: Rogene Houston, MD;  Location: [facility];  Service: Endoscopy;  Laterality: N/A;   HIP SURGERY     pelvis   JOINT REPLACEMENT     KNEE SURGERY  04/2010.   Total left knee replacement   LAPAROSCOPIC APPENDECTOMY N/A 08/01/2013   Procedure: APPENDECTOMY LAPAROSCOPIC;  Surgeon: Jamesetta So, MD;  Location: [facility];  Service: General;  Laterality: N/A;   RIGHT/LEFT HEART CATH AND CORONARY ANGIOGRAPHY N/A 10/02/2019   Procedure: RIGHT/LEFT HEART CATH AND CORONARY ANGIOGRAPHY;  Surgeon: Lorretta Harp, MD;  Location: [facility];  Service: Cardiovascular;  Laterality: N/A;   Family History  Problem Relation Age of Onset   Aneurysm Mother    Stroke Mother    Hypertension Mother    Cancer Father        lung   Heart disease Sister    Hip fracture Sister    Cancer  Brother    Alzheimer's disease Sister    Cancer Sister        skin, uterine   Cancer Brother    Social History   Socioeconomic History   Marital status: Widowed    Spouse name: Not on file   Number of children: 2   Years of education: 0   Highest education level: Never attended school  Occupational History   Occupation: retired  Tobacco Use   Smoking status: Former    Types: Cigarettes    Quit date: 02/24/1968    Years since quitting: 54.2   Smokeless tobacco: Current    Types: Snuff  Tobacco comments:    quit yrs and yrs ago when she was very young  Media planner   Vaping Use: Never used  Substance and Sexual Activity   Alcohol use: No   Drug use: No   Sexual activity: Not Currently    Birth control/protection: Post-menopausal  Other Topics Concern   Not on file  Social History Narrative   Lives alone, but has 24 hour caregiver, palliative care   Daughter lives nearby and helps her with paying bills, shopping, etc.   Social Determinants of Health   Financial Resource Strain: Low Risk  (05/13/2022)   Overall Financial Resource Strain (CARDIA)    Difficulty of Paying Living Expenses: Not hard at all  Food Insecurity: No Food Insecurity (05/13/2022)   Hunger Vital Sign    Worried About Running Out of Food in the Last Year: Never true    [place] in the Last Year: Never true  Transportation Needs: No Transportation Needs (05/13/2022)   PRAPARE - Hydrologist (Medical): No    Lack of Transportation (Non-Medical): No  Physical Activity: Insufficiently Active (05/13/2022)   Exercise Vital Sign    Days of Exercise per Week: 3 days    Minutes of Exercise per Session: 10 min  Stress: No Stress Concern Present (05/13/2022)   [place]    Feeling of Stress : Not at all  Social Connections: Socially Isolated (05/13/2022)   Social Connection and Isolation Panel [NHANES]     Frequency of Communication with Friends and Family: More than three times a week    Frequency of Social Gatherings with Friends and Family: More than three times a week    Attends Religious Services: Never    Marine scientist or Organizations: No    Attends Archivist Meetings: Never    Marital Status: Widowed    Tobacco Counseling Ready to quit: Not Answered Counseling given: Not Answered Tobacco comments: quit yrs and yrs ago when she was very young   Clinical Intake:  Pre-visit preparation completed: Yes  Pain : No/denies pain     Nutritional Risks: None Diabetes: No  How often do you need to have someone help you when you read instructions, pamphlets, or other written materials from your doctor or pharmacy?: 1 - Never  Diabetic?no   Interpreter Needed?: No  Information entered by :: Jadene Pierini, LPN   Activities of Daily Living    05/13/2022   12:09 PM 06/27/2021    8:49 AM  In your present state of health, do you have any difficulty performing the following activities:  Hearing? 0   Vision? 0   Difficulty concentrating or making decisions? 0   Walking or climbing stairs? 0   Dressing or bathing? 0   Doing errands, shopping? 0 0  Preparing Food and eating ? N   Using the Toilet? N   In the past six months, have you accidently leaked urine? N   Do you have problems with loss of bowel control? N   Managing your Medications? Y   Managing your Finances? Y   Housekeeping or managing your Housekeeping? Y     Patient Care Team: Sharion Balloon, FNP as PCP - General (Family Medicine) Debara Pickett Nadean Corwin, MD as PCP - Cardiology (Cardiology) Rogene Houston, MD as Consulting Physician (Gastroenterology) Burnell Blanks, MD as Consulting Physician (Cardiology) Shea Evans Norva Riffle, LCSW as Social Worker (Licensed  Clinical Social Worker)  Indicate any recent Medical Services you may have received from other than [facility] providers in the past year  (date may be approximate).     Assessment:   This is a routine wellness examination for Sheila [name].  Hearing/Vision screen Vision Screening - Comments:: Wears rx glasses - up to date with routine eye exams with  Dr.Lee   Dietary issues and exercise activities discussed: Current Exercise Habits: Home exercise routine, Type of exercise: walking, Time (Minutes): 10, Frequency (Times/Week): 3, Weekly Exercise (Minutes/Week): 30, Intensity: Mild, Exercise limited by: orthopedic condition(s)   Goals Addressed             This Visit's Progress    DIET - INCREASE WATER INTAKE   On track    Try to drink 6-8 glasses of water daily       Depression Screen    05/13/2022   12:07 PM 05/05/2022    1:44 PM 07/29/2021   10:36 AM 07/14/2021    2:38 PM 06/19/2021    3:38 PM 03/18/2021   10:59 AM 02/18/2021    3:58 PM  PHQ 2/9 Scores  PHQ - 2 Score 0 2 2 2  1 1   PHQ- 9 Score 0 4 4 7   5   Exception Documentation     Patient refusal      Fall Risk    05/13/2022   12:06 PM 05/05/2022    1:36 PM 12/29/2021   12:21 PM 11/28/2021   10:24 AM 11/07/2021    7:59 AM  Fall Risk   Falls in the past year? 0 1 1 1 1   Number falls in past yr: 0 0 1 1 1   Injury with Fall? 0 0 1 1 1   Risk for fall due to : No Fall Risks History of fall(s) History of fall(s) History of fall(s) History of fall(s)  Follow up Falls prevention discussed Falls evaluation completed Falls evaluation completed Falls evaluation completed     [place]:  Any stairs in or around the home? Yes  If so, are there any without handrails? No  Home free of loose throw rugs in walkways, pet beds, electrical cords, etc? Yes  Adequate lighting in your home to reduce risk of falls? Yes   ASSISTIVE DEVICES UTILIZED TO PREVENT FALLS:  Life alert? No  Use of a cane, walker or w/c? Yes  Grab bars in the bathroom? Yes  Shower chair or bench in shower? Yes  Elevated toilet seat or a handicapped toilet? Yes        12/22/2017    4:48 PM 07/08/2016    3:37 PM 07/10/2015    2:53 PM 07/06/2014    9:32 AM 07/06/2014    9:29 AM  MMSE - Mini Mental State Exam  Not completed: Unable to complete Unable to complete Unable to complete    Orientation to time  3 4 5 5   Orientation to Place  3 5 2 2   Registration  3 3 3 3   Attention/ Calculation  0 0 0 0  Recall  3 3 3 3   Language- name 2 objects  2 2 2    Language- repeat  1 1 1    Language- follow 3 step command  3 3 3    Language- read & follow direction  0 1 0   Language-read & follow direction-comments    patient is not able to compete this question because does not read   Write a sentence  0 0 0   Write a sentence-comments    unable to complete this question b/c does not read   Copy design  1 0 1   Total score  19 22 20          12/28/2019    1:37 PM 12/27/2018    2:52 PM 12/22/2017    4:48 PM 12/22/2017   10:55 AM  6CIT Screen  What Year? 0 points 0 points 4 points 0 points  What month? 3 points 3 points 3 points 0 points  What time? 0 points 0 points  0 points  Count back from 20 4 points 4 points 4 points 0 points  Months in reverse 4 points 4 points 4 points 0 points  Repeat phrase 6 points 8 points  2 points  Total Score 17 points 19 points  2 points    Immunizations Immunization History  Administered Date(s) Administered   Fluad Quad(high Dose 65+) 02/15/2020, 12/24/2020, 11/07/2021   Influenza, High Dose Seasonal PF 02/10/2016, 12/15/2016, 12/09/2017   Influenza, Seasonal, Injecte, Preservative Fre 11/29/2008, 12/10/2009, 11/30/2011, 04/05/2012   Influenza,inj,Quad PF,6+ Mos 11/15/2012, 12/11/2013, 01/07/2015, 01/05/2019, 01/20/2019   Influenza-Unspecified 02/10/2016, 12/15/2016, 12/09/2017, 01/05/2019   Pneumococcal Conjugate-13 07/06/2014   Pneumococcal Polysaccharide-23 12/12/2007, 12/15/2016   Td 04/06/1989, 06/03/1989   Td (Adult), 2 Lf Tetanus Toxid, Preservative Free 04/06/1989, 06/03/1989   Tdap 02/23/2006   Zoster  Recombinat (Shingrix) 09/23/2020, 04/24/2021   Zoster, Live 09/02/2010, 01/07/2015    TDAP status: Due, Education has been provided regarding the importance of this vaccine. Advised may receive this vaccine at local pharmacy or Health Dept. Aware to provide a copy of the vaccination record if obtained from local pharmacy or Health Dept. Verbalized acceptance and understanding.  Flu Vaccine status: Due, Education has been provided regarding the importance of this vaccine. Advised may receive this vaccine at local pharmacy or Health Dept. Aware to provide a copy of the vaccination record if obtained from local pharmacy or Health Dept. Verbalized acceptance and understanding.  Pneumococcal vaccine status: Up to date  Covid-19 vaccine status: Completed vaccines  Qualifies for Shingles Vaccine? Yes   Zostavax completed Yes   Shingrix Completed?: Yes  Screening Tests Health Maintenance  Topic Date Due   DEXA SCAN  12/23/2019   COVID-19 Vaccine (1) 07/06/2022 (Originally 07/12/1945)   Medicare Annual Wellness (AWV)  05/13/2023   Pneumonia Vaccine 90+ Years old  Completed   INFLUENZA VACCINE  Completed   Zoster Vaccines- Shingrix  Completed   HPV VACCINES  Aged Out   DTaP/Tdap/Td  Discontinued    Health Maintenance  Health Maintenance Due  Topic Date Due   DEXA SCAN  12/23/2019    Colorectal cancer screening: No longer required.   Mammogram status: No longer required due to age.  Bone Density status: Ordered 03/120/2024. Pt provided with contact info and advised to call to schedule appt.  Lung Cancer Screening: (Low Dose CT Chest recommended if Age 35-80 years, 30 pack-year currently smoking OR have quit w/in 15years.) does not qualify.   Lung Cancer Screening Referral: n/a  Additional Screening:  Hepatitis C Screening: does not qualify;   Vision Screening: Recommended annual ophthalmology exams for early detection of glaucoma and other disorders of the eye. Is the patient  up to date with their annual eye exam?  Yes  Who is the provider or what is the name of the office in which the patient attends annual eye exams? Dr.Lee  If pt is not established with a  provider, would they like to be referred to a provider to establish care? No .   Dental Screening: Recommended annual dental exams for proper oral hygiene  Community Resource Referral / Chronic Care Management: CRR required this visit?  No   CCM required this visit?  No      Plan:     I have personally reviewed and noted the following in the patient's chart:   Medical and social history Use of alcohol, tobacco or illicit drugs  Current medications and supplements including opioid prescriptions. Patient is not currently taking opioid prescriptions. Functional ability and status Nutritional status Physical activity Advanced directives List of other physicians Hospitalizations, surgeries, and ER visits in previous 12 months Vitals Screenings to include cognitive, depression, and falls Referrals and appointments  In addition, I have reviewed and discussed with patient certain preventive protocols, quality metrics, and best practice recommendations. A written personalized care plan for preventive services as well as general preventive health recommendations were provided to patient.     Daphane Shepherd, LPN   [id number]   Nurse Notes: Allergy to TDAP vaccine

## 2022-05-18 ENCOUNTER — Other Ambulatory Visit: Payer: Self-pay | Admitting: Family

## 2022-05-20 ENCOUNTER — Ambulatory Visit (INDEPENDENT_AMBULATORY_CARE_PROVIDER_SITE_OTHER): Payer: 59

## 2022-05-20 ENCOUNTER — Other Ambulatory Visit: Payer: Self-pay | Admitting: Family Medicine

## 2022-05-20 DIAGNOSIS — Z780 Asymptomatic menopausal state: Secondary | ICD-10-CM

## 2022-05-20 DIAGNOSIS — Z0000 Encounter for general adult medical examination without abnormal findings: Secondary | ICD-10-CM

## 2022-05-21 DIAGNOSIS — Z780 Asymptomatic menopausal state: Secondary | ICD-10-CM | POA: Diagnosis not present

## 2022-05-21 DIAGNOSIS — M8589 Other specified disorders of bone density and structure, multiple sites: Secondary | ICD-10-CM | POA: Diagnosis not present

## 2022-05-26 ENCOUNTER — Ambulatory Visit: Payer: 59 | Admitting: Physical Therapy

## 2022-05-28 ENCOUNTER — Other Ambulatory Visit: Payer: Self-pay | Admitting: Family

## 2022-06-03 ENCOUNTER — Other Ambulatory Visit: Payer: Self-pay | Admitting: Family

## 2022-06-09 ENCOUNTER — Other Ambulatory Visit: Payer: Self-pay | Admitting: Family

## 2022-06-09 ENCOUNTER — Other Ambulatory Visit: Payer: Self-pay | Admitting: Nurse Practitioner

## 2022-06-09 DIAGNOSIS — G629 Polyneuropathy, unspecified: Secondary | ICD-10-CM

## 2022-06-09 DIAGNOSIS — L282 Other prurigo: Secondary | ICD-10-CM

## 2022-06-10 ENCOUNTER — Telehealth: Payer: Self-pay | Admitting: Family

## 2022-06-10 DIAGNOSIS — M19011 Primary osteoarthritis, right shoulder: Secondary | ICD-10-CM

## 2022-06-10 NOTE — Telephone Encounter (Signed)
Pts caregiver called stating that she was told that PT office that we referred pt to is unable to do PT with pt because referral is not coded right so they can't file the referral with insurance.  Needs someone to correct and resend.

## 2022-06-11 ENCOUNTER — Other Ambulatory Visit: Payer: Self-pay | Admitting: Family

## 2022-06-15 ENCOUNTER — Encounter: Payer: Self-pay | Admitting: Family

## 2022-06-15 ENCOUNTER — Ambulatory Visit (INDEPENDENT_AMBULATORY_CARE_PROVIDER_SITE_OTHER): Payer: 59 | Admitting: Family

## 2022-06-15 VITALS — BP 160/89 | HR 100 | Temp 97.8°F | Ht 59.0 in | Wt 237.0 lb

## 2022-06-15 DIAGNOSIS — K219 Gastro-esophageal reflux disease without esophagitis: Secondary | ICD-10-CM

## 2022-06-15 DIAGNOSIS — B370 Candidal stomatitis: Secondary | ICD-10-CM | POA: Diagnosis not present

## 2022-06-15 DIAGNOSIS — B351 Tinea unguium: Secondary | ICD-10-CM | POA: Diagnosis not present

## 2022-06-15 DIAGNOSIS — M1711 Unilateral primary osteoarthritis, right knee: Secondary | ICD-10-CM | POA: Diagnosis not present

## 2022-06-15 MED ORDER — METHYLPREDNISOLONE ACETATE 80 MG/ML IJ SUSP
80.0000 mg | Freq: Once | INTRAMUSCULAR | Status: AC
  Administered 2022-06-15: 80 mg via INTRA_ARTICULAR

## 2022-06-15 MED ORDER — TERBINAFINE HCL 250 MG PO TABS
250.0000 mg | ORAL_TABLET | Freq: Every day | ORAL | 0 refills | Status: DC
Start: 2022-06-15 — End: 2022-07-09

## 2022-06-15 MED ORDER — DEXLANSOPRAZOLE 60 MG PO CPDR: 60.0000 mg | DELAYED_RELEASE_CAPSULE | Freq: Every day | ORAL | 1 refills | Status: DC

## 2022-06-15 MED ORDER — BUPIVACAINE HCL 0.25 % IJ SOLN
1.0000 mL | Freq: Once | INTRAMUSCULAR | Status: AC
  Administered 2022-06-15: 1 mL via INTRA_ARTICULAR

## 2022-06-15 MED ORDER — MAGIC MOUTHWASH W/LIDOCAINE: 10.0000 mL | Freq: Three times a day (TID) | ORAL | 0 refills | Status: DC | PRN

## 2022-06-15 NOTE — Progress Notes (Signed)
Subjective:    Patient ID: Sheila [name], female    DOB: 06-21-1940, 82 y.o.   MRN: [id number]  Chief Complaint  Patient presents with   Knee Pain    Right knee pain wants injections    Abdominal Pain    Throat burning    Nail Problem   PT presents to the office today with multiple complaints.Complaining of right knee pain that is aching 8 out 10.   Complaining of right great toenail thickness and discoloration that has been on going for years.   Complaining of mouth pain on and off.  Knee Pain  The incident occurred more than 1 week ago. There was no injury mechanism. The pain is present in the right knee. The pain is at a severity of 10/10. The pain is moderate. The pain has been Intermittent since onset.  Abdominal Pain This is a recurrent problem. The current episode started more than 1 month ago. The onset quality is gradual. The pain is located in the generalized abdominal region and epigastric region. Her past medical history is significant for GERD.  Gastroesophageal Reflux She complains of abdominal pain, chest pain, heartburn and a hoarse voice. She reports no globus sensation. This is a chronic problem. The current episode started more than 1 year ago. The problem occurs occasionally. She has tried a PPI for the symptoms. The treatment provided moderate relief.      Review of Systems  HENT:  Positive for hoarse voice.   Cardiovascular:  Positive for chest pain.  Gastrointestinal:  Positive for abdominal pain and heartburn.  All other systems reviewed and are negative.      Objective:   Physical Exam Vitals reviewed.  Constitutional:      General: She is not in acute distress.    Appearance: She is well-developed. She is obese.  HENT:     Head: Normocephalic and atraumatic.     Mouth/Throat:     Comments: White coating on tongue Eyes:     Pupils: Pupils are equal, round, and reactive to light.  Neck:     Thyroid: No thyromegaly.  Cardiovascular:     Rate  and Rhythm: Normal rate and regular rhythm.     Heart sounds: Normal heart sounds. No murmur heard. Pulmonary:     Effort: Pulmonary effort is normal. No respiratory distress.     Breath sounds: Normal breath sounds. No wheezing.  Abdominal:     General: Bowel sounds are normal. There is no distension.     Palpations: Abdomen is soft.     Tenderness: There is no abdominal tenderness.  Musculoskeletal:        General: No tenderness.     Cervical back: Normal range of motion and neck supple.     Comments: Pain in bilateral knees with flexion and extension   Skin:    General: Skin is warm and dry.  Neurological:     Mental Status: She is alert and oriented to person, place, and time.     Cranial Nerves: No cranial nerve deficit.     Motor: Weakness (using rolling walker) present.     Gait: Gait abnormal.     Deep Tendon Reflexes: Reflexes are normal and symmetric.  Psychiatric:        Behavior: Behavior normal.        Thought Content: Thought content normal.        Judgment: Judgment normal.    rightknee prepped with betadine Injected with Marcaine .25% plain  and methylprednisolone with 25 guage needle x 1. Patient tolerated well.    BP (!) 160/89   Pulse 100   Temp 97.8 F (36.6 C) (Temporal)   Ht  (1.499 m)   Wt 237 lb (107.5 kg)   SpO2 95%   BMI 47.87 kg/m       Assessment & Plan:   Sheila [name] comes in today with chief complaint of Knee Pain (Right knee pain wants injections ), Abdominal Pain (Throat burning ), and Nail Problem   Diagnosis and orders addressed:  1. Gastroesophageal reflux disease, unspecified whether esophagitis present Stop Protonix and start Dexilant 60 mg  -Diet discussed- Avoid fried, spicy, citrus foods, caffeine and alcohol -Do not eat 2-3 hours before bedtime -Encouraged small frequent meals -Avoid NSAID's - dexlansoprazole (DEXILANT) 60 MG capsule; Take 1 capsule (60 mg total) by mouth daily.  Dispense: 90 capsule; Refill:  1  2. Unilateral primary osteoarthritis, right knee - methylPREDNISolone acetate (DEPO-MEDROL) injection 80 mg - bupivacaine (MARCAINE) 0.25 % (with pres) injection 1 mL  3. Oral thrush Magic mouthwash - magic mouthwash w/lidocaine SOLN; Take 10 mLs by mouth 3 (three) times daily as needed for mouth pain.  Dispense: 250 mL; Refill: 0  4. Onychomycosis Start lamisil  CMP stable  - terbinafine (LAMISIL) 250 MG tablet; Take 1 tablet (250 mg total) by mouth daily.  Dispense: 90 tablet; Refill: 0   Labs pending Health Maintenance reviewed Diet and exercise encouraged  Follow up plan: Keep chronic follow up    Jannifer Rodney, FNP

## 2022-06-15 NOTE — Patient Instructions (Signed)

## 2022-06-16 ENCOUNTER — Other Ambulatory Visit: Payer: Self-pay | Admitting: Family

## 2022-06-16 ENCOUNTER — Other Ambulatory Visit: Payer: Self-pay | Admitting: Nurse Practitioner

## 2022-06-16 ENCOUNTER — Telehealth: Payer: Self-pay | Admitting: Nurse Practitioner

## 2022-06-16 DIAGNOSIS — G8929 Other chronic pain: Secondary | ICD-10-CM

## 2022-06-16 DIAGNOSIS — Z0289 Encounter for other administrative examinations: Secondary | ICD-10-CM

## 2022-06-16 DIAGNOSIS — J449 Chronic obstructive pulmonary disease, unspecified: Secondary | ICD-10-CM

## 2022-06-16 DIAGNOSIS — E559 Vitamin D deficiency, unspecified: Secondary | ICD-10-CM

## 2022-06-16 DIAGNOSIS — M545 Low back pain, unspecified: Secondary | ICD-10-CM

## 2022-06-16 DIAGNOSIS — M159 Polyosteoarthritis, unspecified: Secondary | ICD-10-CM

## 2022-06-16 DIAGNOSIS — Z79899 Other long term (current) drug therapy: Secondary | ICD-10-CM

## 2022-06-16 MED ORDER — TRAMADOL HCL 50 MG PO TABS
ORAL_TABLET | ORAL | 2 refills | Status: DC
Start: 2022-06-16 — End: 2022-07-09

## 2022-06-16 NOTE — Addendum Note (Signed)
Addended by: Julious Payer D on: 06/16/2022 03:53 PM   Modules accepted: Orders

## 2022-06-16 NOTE — Telephone Encounter (Signed)
I just entered orders

## 2022-06-16 NOTE — Telephone Encounter (Signed)
TC from [facility] pharmacy RE: Tramadol #[id number] is a 12-d supply which was RFd at last OV on 05/05/22 Pt was seen yesterday 06/15/22, needs refill, please advise

## 2022-06-16 NOTE — Telephone Encounter (Signed)
Patient is an requesting appt. Can you update labs and let me know and I will call her for an appt. Thanks  [phone]

## 2022-06-17 NOTE — Telephone Encounter (Signed)
Tried calling-no answer.  

## 2022-06-23 ENCOUNTER — Ambulatory Visit: Payer: 59 | Admitting: Physical Therapy

## 2022-06-30 ENCOUNTER — Other Ambulatory Visit: Payer: Self-pay

## 2022-06-30 ENCOUNTER — Encounter (HOSPITAL_COMMUNITY): Payer: Self-pay | Admitting: *Deleted

## 2022-06-30 ENCOUNTER — Emergency Department (HOSPITAL_COMMUNITY): Payer: 59

## 2022-06-30 ENCOUNTER — Inpatient Hospital Stay (HOSPITAL_COMMUNITY)
Admission: EM | Admit: 2022-06-30 | Discharge: 2022-07-09 | DRG: 854 | Disposition: A | Discharge status: Skilled Nursing Facility | Payer: 59 | Attending: Internal Medicine | Admitting: Internal Medicine

## 2022-06-30 DIAGNOSIS — Z0289 Encounter for other administrative examinations: Secondary | ICD-10-CM

## 2022-06-30 DIAGNOSIS — R6520 Severe sepsis without septic shock: Secondary | ICD-10-CM | POA: Diagnosis not present

## 2022-06-30 DIAGNOSIS — E785 Hyperlipidemia, unspecified: Secondary | ICD-10-CM | POA: Diagnosis not present

## 2022-06-30 DIAGNOSIS — W010XXA Fall on same level from slipping, tripping and stumbling without subsequent striking against object, initial encounter: Secondary | ICD-10-CM | POA: Diagnosis present

## 2022-06-30 DIAGNOSIS — Y92000 Kitchen of unspecified non-institutional (private) residence as  the place of occurrence of the external cause: Secondary | ICD-10-CM

## 2022-06-30 DIAGNOSIS — R0603 Acute respiratory distress: Secondary | ICD-10-CM | POA: Diagnosis not present

## 2022-06-30 DIAGNOSIS — S82842A Displaced bimalleolar fracture of left lower leg, initial encounter for closed fracture: Secondary | ICD-10-CM

## 2022-06-30 DIAGNOSIS — F1320 Sedative, hypnotic or anxiolytic dependence, uncomplicated: Secondary | ICD-10-CM

## 2022-06-30 DIAGNOSIS — J449 Chronic obstructive pulmonary disease, unspecified: Secondary | ICD-10-CM | POA: Diagnosis present

## 2022-06-30 DIAGNOSIS — S82892A Other fracture of left lower leg, initial encounter for closed fracture: Secondary | ICD-10-CM | POA: Insufficient documentation

## 2022-06-30 DIAGNOSIS — M542 Cervicalgia: Secondary | ICD-10-CM | POA: Diagnosis not present

## 2022-06-30 DIAGNOSIS — I959 Hypotension, unspecified: Secondary | ICD-10-CM | POA: Diagnosis not present

## 2022-06-30 DIAGNOSIS — I428 Other cardiomyopathies: Secondary | ICD-10-CM | POA: Diagnosis present

## 2022-06-30 DIAGNOSIS — Z79890 Hormone replacement therapy (postmenopausal): Secondary | ICD-10-CM

## 2022-06-30 DIAGNOSIS — N390 Urinary tract infection, site not specified: Secondary | ICD-10-CM | POA: Diagnosis present

## 2022-06-30 DIAGNOSIS — E7800 Pure hypercholesterolemia, unspecified: Secondary | ICD-10-CM | POA: Diagnosis not present

## 2022-06-30 DIAGNOSIS — I509 Heart failure, unspecified: Secondary | ICD-10-CM | POA: Diagnosis not present

## 2022-06-30 DIAGNOSIS — Z602 Problems related to living alone: Secondary | ICD-10-CM | POA: Diagnosis present

## 2022-06-30 DIAGNOSIS — R6889 Other general symptoms and signs: Secondary | ICD-10-CM | POA: Diagnosis not present

## 2022-06-30 DIAGNOSIS — S82832A Other fracture of upper and lower end of left fibula, initial encounter for closed fracture: Secondary | ICD-10-CM | POA: Diagnosis not present

## 2022-06-30 DIAGNOSIS — M62838 Other muscle spasm: Secondary | ICD-10-CM | POA: Diagnosis present

## 2022-06-30 DIAGNOSIS — F411 Generalized anxiety disorder: Secondary | ICD-10-CM | POA: Diagnosis present

## 2022-06-30 DIAGNOSIS — D649 Anemia, unspecified: Secondary | ICD-10-CM | POA: Diagnosis not present

## 2022-06-30 DIAGNOSIS — Z8249 Family history of ischemic heart disease and other diseases of the circulatory system: Secondary | ICD-10-CM

## 2022-06-30 DIAGNOSIS — I5022 Chronic systolic (congestive) heart failure: Secondary | ICD-10-CM | POA: Diagnosis present

## 2022-06-30 DIAGNOSIS — R197 Diarrhea, unspecified: Secondary | ICD-10-CM | POA: Diagnosis present

## 2022-06-30 DIAGNOSIS — Z7401 Bed confinement status: Secondary | ICD-10-CM

## 2022-06-30 DIAGNOSIS — I5020 Unspecified systolic (congestive) heart failure: Secondary | ICD-10-CM | POA: Diagnosis not present

## 2022-06-30 DIAGNOSIS — S82891A Other fracture of right lower leg, initial encounter for closed fracture: Secondary | ICD-10-CM | POA: Insufficient documentation

## 2022-06-30 DIAGNOSIS — Z7951 Long term (current) use of inhaled steroids: Secondary | ICD-10-CM

## 2022-06-30 DIAGNOSIS — Z886 Allergy status to analgesic agent status: Secondary | ICD-10-CM

## 2022-06-30 DIAGNOSIS — Z01810 Encounter for preprocedural cardiovascular examination: Secondary | ICD-10-CM | POA: Diagnosis not present

## 2022-06-30 DIAGNOSIS — G8929 Other chronic pain: Secondary | ICD-10-CM | POA: Diagnosis present

## 2022-06-30 DIAGNOSIS — Z66 Do not resuscitate: Secondary | ICD-10-CM | POA: Diagnosis present

## 2022-06-30 DIAGNOSIS — K219 Gastro-esophageal reflux disease without esophagitis: Secondary | ICD-10-CM | POA: Diagnosis not present

## 2022-06-30 DIAGNOSIS — Z6841 Body Mass Index (BMI) 40.0 and over, adult: Secondary | ICD-10-CM

## 2022-06-30 DIAGNOSIS — Z743 Need for continuous supervision: Secondary | ICD-10-CM | POA: Diagnosis not present

## 2022-06-30 DIAGNOSIS — S82842K Displaced bimalleolar fracture of left lower leg, subsequent encounter for closed fracture with nonunion: Secondary | ICD-10-CM | POA: Diagnosis not present

## 2022-06-30 DIAGNOSIS — I110 Hypertensive heart disease with heart failure: Secondary | ICD-10-CM | POA: Diagnosis not present

## 2022-06-30 DIAGNOSIS — G8918 Other acute postprocedural pain: Secondary | ICD-10-CM | POA: Diagnosis not present

## 2022-06-30 DIAGNOSIS — Z85828 Personal history of other malignant neoplasm of skin: Secondary | ICD-10-CM

## 2022-06-30 DIAGNOSIS — E876 Hypokalemia: Secondary | ICD-10-CM | POA: Diagnosis not present

## 2022-06-30 DIAGNOSIS — Z823 Family history of stroke: Secondary | ICD-10-CM

## 2022-06-30 DIAGNOSIS — L853 Xerosis cutis: Secondary | ICD-10-CM | POA: Diagnosis present

## 2022-06-30 DIAGNOSIS — E871 Hypo-osmolality and hyponatremia: Secondary | ICD-10-CM | POA: Diagnosis not present

## 2022-06-30 DIAGNOSIS — I5042 Chronic combined systolic (congestive) and diastolic (congestive) heart failure: Secondary | ICD-10-CM | POA: Diagnosis not present

## 2022-06-30 DIAGNOSIS — I130 Hypertensive heart and chronic kidney disease with heart failure and stage 1 through stage 4 chronic kidney disease, or unspecified chronic kidney disease: Secondary | ICD-10-CM | POA: Diagnosis not present

## 2022-06-30 DIAGNOSIS — Z0389 Encounter for observation for other suspected diseases and conditions ruled out: Secondary | ICD-10-CM | POA: Diagnosis not present

## 2022-06-30 DIAGNOSIS — S82892K Other fracture of left lower leg, subsequent encounter for closed fracture with nonunion: Secondary | ICD-10-CM | POA: Diagnosis not present

## 2022-06-30 DIAGNOSIS — F32A Depression, unspecified: Secondary | ICD-10-CM | POA: Diagnosis not present

## 2022-06-30 DIAGNOSIS — R7989 Other specified abnormal findings of blood chemistry: Secondary | ICD-10-CM | POA: Diagnosis present

## 2022-06-30 DIAGNOSIS — N179 Acute kidney failure, unspecified: Secondary | ICD-10-CM | POA: Diagnosis present

## 2022-06-30 DIAGNOSIS — E039 Hypothyroidism, unspecified: Secondary | ICD-10-CM | POA: Diagnosis present

## 2022-06-30 DIAGNOSIS — I499 Cardiac arrhythmia, unspecified: Secondary | ICD-10-CM | POA: Diagnosis not present

## 2022-06-30 DIAGNOSIS — S82424A Nondisplaced transverse fracture of shaft of right fibula, initial encounter for closed fracture: Secondary | ICD-10-CM | POA: Diagnosis not present

## 2022-06-30 DIAGNOSIS — R0902 Hypoxemia: Secondary | ICD-10-CM | POA: Diagnosis not present

## 2022-06-30 DIAGNOSIS — N1832 Chronic kidney disease, stage 3b: Secondary | ICD-10-CM | POA: Diagnosis present

## 2022-06-30 DIAGNOSIS — Z96659 Presence of unspecified artificial knee joint: Secondary | ICD-10-CM | POA: Diagnosis present

## 2022-06-30 DIAGNOSIS — M1710 Unilateral primary osteoarthritis, unspecified knee: Secondary | ICD-10-CM | POA: Diagnosis present

## 2022-06-30 DIAGNOSIS — S82842D Displaced bimalleolar fracture of left lower leg, subsequent encounter for closed fracture with routine healing: Secondary | ICD-10-CM | POA: Diagnosis not present

## 2022-06-30 DIAGNOSIS — S82425A Nondisplaced transverse fracture of shaft of left fibula, initial encounter for closed fracture: Secondary | ICD-10-CM | POA: Diagnosis not present

## 2022-06-30 DIAGNOSIS — M159 Polyosteoarthritis, unspecified: Secondary | ICD-10-CM

## 2022-06-30 DIAGNOSIS — A419 Sepsis, unspecified organism: Principal | ICD-10-CM | POA: Diagnosis present

## 2022-06-30 DIAGNOSIS — S82831A Other fracture of upper and lower end of right fibula, initial encounter for closed fracture: Secondary | ICD-10-CM | POA: Diagnosis not present

## 2022-06-30 DIAGNOSIS — Z79899 Other long term (current) drug therapy: Secondary | ICD-10-CM

## 2022-06-30 DIAGNOSIS — Z885 Allergy status to narcotic agent status: Secondary | ICD-10-CM

## 2022-06-30 DIAGNOSIS — S82402D Unspecified fracture of shaft of left fibula, subsequent encounter for closed fracture with routine healing: Secondary | ICD-10-CM | POA: Diagnosis not present

## 2022-06-30 DIAGNOSIS — Z7983 Long term (current) use of bisphosphonates: Secondary | ICD-10-CM

## 2022-06-30 DIAGNOSIS — R000 Tachycardia, unspecified: Secondary | ICD-10-CM | POA: Diagnosis not present

## 2022-06-30 DIAGNOSIS — K279 Peptic ulcer, site unspecified, unspecified as acute or chronic, without hemorrhage or perforation: Secondary | ICD-10-CM | POA: Diagnosis not present

## 2022-06-30 DIAGNOSIS — S82424D Nondisplaced transverse fracture of shaft of right fibula, subsequent encounter for closed fracture with routine healing: Secondary | ICD-10-CM | POA: Diagnosis not present

## 2022-06-30 DIAGNOSIS — Z888 Allergy status to other drugs, medicaments and biological substances status: Secondary | ICD-10-CM

## 2022-06-30 DIAGNOSIS — F17290 Nicotine dependence, other tobacco product, uncomplicated: Secondary | ICD-10-CM | POA: Diagnosis present

## 2022-06-30 DIAGNOSIS — Z887 Allergy status to serum and vaccine status: Secondary | ICD-10-CM

## 2022-06-30 DIAGNOSIS — Z820 Family history of epilepsy and other diseases of the nervous system: Secondary | ICD-10-CM

## 2022-06-30 DIAGNOSIS — R110 Nausea: Secondary | ICD-10-CM | POA: Diagnosis not present

## 2022-06-30 DIAGNOSIS — I10 Essential (primary) hypertension: Secondary | ICD-10-CM | POA: Diagnosis present

## 2022-06-30 DIAGNOSIS — R739 Hyperglycemia, unspecified: Secondary | ICD-10-CM | POA: Diagnosis not present

## 2022-06-30 DIAGNOSIS — I2510 Atherosclerotic heart disease of native coronary artery without angina pectoris: Secondary | ICD-10-CM | POA: Diagnosis present

## 2022-06-30 HISTORY — DX: Atherosclerotic heart disease of native coronary artery without angina pectoris: I25.10

## 2022-06-30 HISTORY — DX: Unspecified systolic (congestive) heart failure: I50.20

## 2022-06-30 HISTORY — DX: Unspecified malignant neoplasm of skin, unspecified: C44.90

## 2022-06-30 HISTORY — DX: Other cardiomyopathies: I42.8

## 2022-06-30 LAB — CBC WITH DIFFERENTIAL/PLATELET
Abs Immature Granulocytes: 0.13 10*3/uL — ABNORMAL HIGH (ref 0.00–0.07)
Basophils Absolute: 0.1 10*3/uL (ref 0.0–0.1)
Basophils Relative: 0 %
Eosinophils Absolute: 0 10*3/uL (ref 0.0–0.5)
Eosinophils Relative: 0 %
HCT: 36.4 % (ref 36.0–46.0)
Hemoglobin: 12 g/dL (ref 12.0–15.0)
Immature Granulocytes: 1 %
Lymphocytes Relative: 8 %
Lymphs Abs: 1.7 10*3/uL (ref 0.7–4.0)
MCH: 28.5 pg (ref 26.0–34.0)
MCHC: 33 g/dL (ref 30.0–36.0)
MCV: 86.5 fL (ref 80.0–100.0)
Monocytes Absolute: 1.8 10*3/uL — ABNORMAL HIGH (ref 0.1–1.0)
Monocytes Relative: 9 %
Neutro Abs: 17 10*3/uL — ABNORMAL HIGH (ref 1.7–7.7)
Neutrophils Relative %: 82 %
Platelets: 167 10*3/uL (ref 150–400)
RBC: 4.21 MIL/uL (ref 3.87–5.11)
RDW: 14.6 % (ref 11.5–15.5)
WBC: 20.6 10*3/uL — ABNORMAL HIGH (ref 4.0–10.5)
nRBC: 0 % (ref 0.0–0.2)

## 2022-06-30 LAB — URINALYSIS, W/ REFLEX TO CULTURE (INFECTION SUSPECTED)
Bilirubin Urine: NEGATIVE
Glucose, UA: NEGATIVE mg/dL
Ketones, ur: NEGATIVE mg/dL
Nitrite: NEGATIVE
Protein, ur: 100 mg/dL — AB
Specific Gravity, Urine: 1.014 (ref 1.005–1.030)
WBC, UA: >50 WBC/hpf (ref 0–5)
pH: 5 (ref 5.0–8.0)

## 2022-06-30 LAB — PROTIME-INR
INR: 1.1 (ref 0.8–1.2)
Prothrombin Time: 14.9 seconds (ref 11.4–15.2)

## 2022-06-30 LAB — COMPREHENSIVE METABOLIC PANEL
ALT: 25 U/L (ref 0–44)
AST: 30 U/L (ref 15–41)
Albumin: 3.7 g/dL (ref 3.5–5.0)
Alkaline Phosphatase: 38 U/L (ref 38–126)
Anion gap: 15 (ref 5–15)
BUN: 26 mg/dL — ABNORMAL HIGH (ref 8–23)
CO2: 21 mmol/L — ABNORMAL LOW (ref 22–32)
Calcium: 8.8 mg/dL — ABNORMAL LOW (ref 8.9–10.3)
Chloride: 100 mmol/L (ref 98–111)
Creatinine, Ser: 1.91 mg/dL — ABNORMAL HIGH (ref 0.44–1.00)
GFR, Estimated: 26 mL/min — ABNORMAL LOW (ref >60)
Glucose, Bld: 124 mg/dL — ABNORMAL HIGH (ref 70–99)
Potassium: 3.8 mmol/L (ref 3.5–5.1)
Sodium: 136 mmol/L (ref 135–145)
Total Bilirubin: 2 mg/dL — ABNORMAL HIGH (ref 0.3–1.2)
Total Protein: 7.5 g/dL (ref 6.5–8.1)

## 2022-06-30 LAB — CULTURE, BLOOD (ROUTINE X 2)

## 2022-06-30 LAB — LACTIC ACID, PLASMA
Lactic Acid, Venous: 1.9 mmol/L (ref 0.5–1.9)
Lactic Acid, Venous: 1.7 mmol/L (ref 0.5–1.9)

## 2022-06-30 LAB — APTT: aPTT: 23 seconds — ABNORMAL LOW (ref 24–36)

## 2022-06-30 LAB — BRAIN NATRIURETIC PEPTIDE: B Natriuretic Peptide: 991 pg/mL — ABNORMAL HIGH (ref 0.0–100.0)

## 2022-06-30 MED ORDER — LEVOCETIRIZINE DIHYDROCHLORIDE 5 MG PO TABS: 5.0000 mg | ORAL_TABLET | Freq: Every evening | ORAL | Status: DC

## 2022-06-30 MED ORDER — LEVOTHYROXINE SODIUM 100 MCG PO TABS
100.0000 ug | ORAL_TABLET | Freq: Every day | ORAL | Status: DC
  Administered 2022-07-01 – 2022-07-09 (×9): 100 ug via ORAL
  Filled 2022-06-30 (×9): qty 1

## 2022-06-30 MED ORDER — CHLORHEXIDINE GLUCONATE CLOTH 2 % EX PADS
6.0000 | MEDICATED_PAD | Freq: Every day | CUTANEOUS | Status: DC
  Administered 2022-06-30 – 2022-07-07 (×8): 6 via TOPICAL

## 2022-06-30 MED ORDER — HYDROXYZINE HCL 25 MG PO TABS
25.0000 mg | ORAL_TABLET | Freq: Three times a day (TID) | ORAL | Status: DC | PRN
  Administered 2022-07-01 – 2022-07-09 (×9): 25 mg via ORAL
  Filled 2022-06-30 (×9): qty 1

## 2022-06-30 MED ORDER — PANTOPRAZOLE SODIUM 40 MG PO TBEC: 40.0000 mg | DELAYED_RELEASE_TABLET | Freq: Every day | ORAL | Status: DC

## 2022-06-30 MED ORDER — METOPROLOL TARTRATE 25 MG PO TABS: 12.5000 mg | ORAL_TABLET | Freq: Two times a day (BID) | ORAL | Status: DC

## 2022-06-30 MED ORDER — ACETAMINOPHEN 325 MG PO TABS
650.0000 mg | ORAL_TABLET | Freq: Once | ORAL | Status: AC
  Administered 2022-06-30: 650 mg via ORAL
  Filled 2022-06-30: qty 2

## 2022-06-30 MED ORDER — HYDROXYZINE PAMOATE 25 MG PO CAPS: 25.0000 mg | ORAL_CAPSULE | Freq: Three times a day (TID) | ORAL | Status: DC | PRN

## 2022-06-30 MED ORDER — TRAMADOL HCL 50 MG PO TABS
50.0000 mg | ORAL_TABLET | Freq: Four times a day (QID) | ORAL | Status: DC | PRN
  Administered 2022-06-30 – 2022-07-01 (×2): 50 mg via ORAL
  Filled 2022-06-30 (×2): qty 1

## 2022-06-30 MED ORDER — MOMETASONE FURO-FORMOTEROL FUM 200-5 MCG/ACT IN AERO
2.0000 | INHALATION_SPRAY | Freq: Two times a day (BID) | RESPIRATORY_TRACT | Status: DC
  Administered 2022-07-01 – 2022-07-09 (×15): 2 via RESPIRATORY_TRACT
  Filled 2022-06-30 (×3): qty 8.8

## 2022-06-30 MED ORDER — LACTATED RINGERS IV SOLN: 150.0000 mL/h | INTRAVENOUS | Status: DC

## 2022-06-30 MED ORDER — HEPARIN SODIUM (PORCINE) 5000 UNIT/ML IJ SOLN
5000.0000 [IU] | Freq: Three times a day (TID) | INTRAMUSCULAR | Status: DC
  Administered 2022-06-30 – 2022-07-03 (×8): 5000 [IU] via SUBCUTANEOUS
  Filled 2022-06-30 (×8): qty 1

## 2022-06-30 MED ORDER — LORATADINE 10 MG PO TABS
10.0000 mg | ORAL_TABLET | Freq: Every day | ORAL | Status: DC
  Administered 2022-07-01 – 2022-07-09 (×9): 10 mg via ORAL
  Filled 2022-06-30 (×9): qty 1

## 2022-06-30 MED ORDER — VANCOMYCIN HCL IN DEXTROSE 1-5 GM/200ML-% IV SOLN
1000.0000 mg | Freq: Once | INTRAVENOUS | Status: AC
  Administered 2022-06-30: 1000 mg via INTRAVENOUS
  Filled 2022-06-30: qty 200

## 2022-06-30 MED ORDER — GABAPENTIN 300 MG PO CAPS
300.0000 mg | ORAL_CAPSULE | Freq: Three times a day (TID) | ORAL | Status: DC
  Administered 2022-06-30 – 2022-07-09 (×26): 300 mg via ORAL
  Filled 2022-06-30 (×26): qty 1

## 2022-06-30 MED ORDER — SODIUM CHLORIDE 0.45 % IV SOLN: INTRAVENOUS | Status: DC

## 2022-06-30 MED ORDER — SACUBITRIL-VALSARTAN 97-103 MG PO TABS: 1.0000 | ORAL_TABLET | Freq: Two times a day (BID) | ORAL | Status: DC

## 2022-06-30 MED ORDER — VITAMIN D 25 MCG (1000 UNIT) PO TABS
1000.0000 [IU] | ORAL_TABLET | Freq: Every day | ORAL | Status: DC
  Administered 2022-06-30 – 2022-07-09 (×10): 1000 [IU] via ORAL
  Filled 2022-06-30 (×10): qty 1

## 2022-06-30 MED ORDER — UMECLIDINIUM BROMIDE 62.5 MCG/ACT IN AEPB
1.0000 | INHALATION_SPRAY | Freq: Every day | RESPIRATORY_TRACT | Status: DC
  Administered 2022-07-01 – 2022-07-09 (×8): 1 via RESPIRATORY_TRACT
  Filled 2022-06-30 (×2): qty 7

## 2022-06-30 MED ORDER — CINACALCET HCL 30 MG PO TABS
30.0000 mg | ORAL_TABLET | ORAL | Status: DC
  Administered 2022-07-01 – 2022-07-09 (×5): 30 mg via ORAL
  Filled 2022-06-30 (×6): qty 1

## 2022-06-30 MED ORDER — METOPROLOL TARTRATE 25 MG PO TABS
12.5000 mg | ORAL_TABLET | Freq: Two times a day (BID) | ORAL | Status: DC
  Administered 2022-06-30 – 2022-07-09 (×18): 12.5 mg via ORAL
  Filled 2022-06-30 (×18): qty 1

## 2022-06-30 MED ORDER — LACTATED RINGERS IV BOLUS
1000.0000 mL | Freq: Once | INTRAVENOUS | Status: AC
  Administered 2022-06-30: 1000 mL via INTRAVENOUS

## 2022-06-30 MED ORDER — ALPRAZOLAM 0.5 MG PO TABS
0.5000 mg | ORAL_TABLET | Freq: Two times a day (BID) | ORAL | Status: DC | PRN
  Administered 2022-06-30 – 2022-07-08 (×12): 0.5 mg via ORAL
  Filled 2022-06-30 (×13): qty 1

## 2022-06-30 MED ORDER — BUDESON-GLYCOPYRROL-FORMOTEROL 160-9-4.8 MCG/ACT IN AERO: 2.0000 | INHALATION_SPRAY | Freq: Two times a day (BID) | RESPIRATORY_TRACT | Status: DC

## 2022-06-30 MED ORDER — NAPHAZOLINE-PHENIRAMINE 0.025-0.3 % OP SOLN: 1.0000 [drp] | Freq: Every day | OPHTHALMIC | Status: DC | PRN

## 2022-06-30 MED ORDER — SPIRONOLACTONE 25 MG PO TABS: 25.0000 mg | ORAL_TABLET | Freq: Every day | ORAL | Status: DC

## 2022-06-30 MED ORDER — PRAVASTATIN SODIUM 40 MG PO TABS
80.0000 mg | ORAL_TABLET | Freq: Every day | ORAL | Status: DC
  Administered 2022-07-01 – 2022-07-08 (×8): 80 mg via ORAL
  Filled 2022-06-30 (×8): qty 2

## 2022-06-30 MED ORDER — FUROSEMIDE 10 MG/ML IJ SOLN
40.0000 mg | Freq: Four times a day (QID) | INTRAMUSCULAR | Status: AC
  Administered 2022-06-30 – 2022-07-01 (×2): 40 mg via INTRAVENOUS
  Filled 2022-06-30 (×2): qty 4

## 2022-06-30 MED ORDER — SERTRALINE HCL 50 MG PO TABS
100.0000 mg | ORAL_TABLET | Freq: Every day | ORAL | Status: DC
  Administered 2022-07-01 – 2022-07-09 (×9): 100 mg via ORAL
  Filled 2022-06-30 (×9): qty 2

## 2022-06-30 MED ORDER — TORSEMIDE 20 MG PO TABS: 20.0000 mg | ORAL_TABLET | Freq: Two times a day (BID) | ORAL | Status: DC

## 2022-06-30 MED ORDER — PANTOPRAZOLE SODIUM 40 MG PO TBEC
40.0000 mg | DELAYED_RELEASE_TABLET | Freq: Two times a day (BID) | ORAL | Status: DC
  Administered 2022-06-30 – 2022-07-09 (×18): 40 mg via ORAL
  Filled 2022-06-30 (×18): qty 1

## 2022-06-30 MED ORDER — SODIUM CHLORIDE 0.9 % IV SOLN
2.0000 g | INTRAVENOUS | Status: DC
  Administered 2022-06-30 – 2022-07-02 (×3): 2 g via INTRAVENOUS
  Filled 2022-06-30 (×3): qty 20

## 2022-06-30 MED ORDER — ALBUTEROL SULFATE (2.5 MG/3ML) 0.083% IN NEBU: 2.5000 mg | INHALATION_SOLUTION | Freq: Four times a day (QID) | RESPIRATORY_TRACT | Status: DC | PRN

## 2022-06-30 MED ORDER — ACETAMINOPHEN 500 MG PO TABS
1000.0000 mg | ORAL_TABLET | Freq: Once | ORAL | Status: AC
  Administered 2022-06-30: 1000 mg via ORAL
  Filled 2022-06-30: qty 2

## 2022-06-30 MED ORDER — ENOXAPARIN SODIUM 40 MG/0.4ML IJ SOSY: 40.0000 mg | PREFILLED_SYRINGE | INTRAMUSCULAR | Status: DC

## 2022-06-30 NOTE — H&P (Addendum)
History and Physical    Sheila [name] WUJ:[id number] DOB: 1940/12/07 DOA: 06/30/2022  DOS: the patient was seen and examined on 06/30/2022  PCP: Sheila [name]   Patient coming from: Home  I have personally briefly reviewed patient's old medical records in [facility]  Sheila [name], an 82 y/o woman with HFrEF, chronic joint pain, anxiety, PUD, COPD by report has been ill for several days. Today she felt worse and weaker. She did suffer [name] fall landing on her ankles with subsequent pain. Due to her illness and ankle pain EMS activated: EMS called [name] CODE SEPSIS at 0941 en route to the hospital. EMS reports HR 127, EtCO2 32 (originally 19 upon their arrival), BP 68/38 (after NS bolus), temp 99.4. EMS reports foul odor to urine and frequent urination. Rocephin 1gm IM given by EMS at 0941. She was transported to [facility] for further evaluation.    ED Course: T 97.5  BP 77/49 - to 95/59 after fluid bolus. HR 66 RR 19. Patient appeared ill, had bruised ankles that were tender. Code sepsis initiated in the field as above. IN ED she received 1 L IVF then LR at 150/hr. She receved Rocephin in the field and Vancomycin in ED. Lab: glucose 124, WBC 20.6 with 82/8/9 BUN 26, Cr 1.91 (baseline 24/1.3) lactic acid 1.9 to 1.7 after resuscitation. BNP 991 (baseline 54) U/[name] - cloudy, LE lrg, many bacteria/hpf, >50 wbc/hpf. X-ray revealed bilateral ankle fractures. TRH called to admit for continued management of urosepsis and chronic disease mgt.   Review of Systems:  Review of Systems  Constitutional:  Positive for malaise/fatigue. Negative for chills, diaphoresis, fever and weight loss.  HENT: Negative.    Eyes: Negative.   Respiratory:  Positive for shortness of breath. Negative for cough, sputum production and wheezing.   Cardiovascular:  Positive for leg swelling. Negative for chest pain, palpitations and PND.  Gastrointestinal:  Negative for abdominal pain, heartburn, nausea and vomiting.   Genitourinary:  Negative for dysuria, frequency, hematuria and urgency.  Musculoskeletal:  Positive for falls and joint pain.  Skin: Negative.   Neurological:  Positive for weakness.  Endo/Heme/Allergies: Negative.   Psychiatric/Behavioral:  The patient is nervous/anxious.     Past Medical History:  Diagnosis Date   Allergy    Bell's palsy    Cancer (HCC)    skin CA on face   Cataract    Chest pain 07/2009   Myoveiw stress test negative.   Depression    Diverticulosis of colon    DJD (degenerative joint disease) of knee    Dyspnea    on exertion   Family history of adverse reaction to anesthesia    sister ha nausea/vomiting   Gastric ulcer with hemorrhage 01/14/2011   GERD (gastroesophageal reflux disease)    Headache    History of fractured pelvis    Hypercholesteremia    Hypertension    Hypothyroidism    LV dysfunction    UTI (lower urinary tract infection)     Past Surgical History:  Procedure Laterality Date   APPENDECTOMY     BREAST BIOPSY Left    ESOPHAGOGASTRODUODENOSCOPY  01/14/2011   Procedure: ESOPHAGOGASTRODUODENOSCOPY (EGD);  Surgeon: Sheila Hippo, MD;  Location: [facility];  Service: Endoscopy;  Laterality: N/[name];   HIP SURGERY     pelvis   JOINT REPLACEMENT     KNEE SURGERY  04/2010.   Total left knee replacement   LAPAROSCOPIC APPENDECTOMY N/[name] 08/01/2013   Procedure:  APPENDECTOMY LAPAROSCOPIC;  Surgeon: Sheila Heading, MD;  Location: [facility] ORS;  Service: General;  Laterality: N/[name];   RIGHT/LEFT HEART CATH AND CORONARY ANGIOGRAPHY N/[name] 10/02/2019   Procedure: RIGHT/LEFT HEART CATH AND CORONARY ANGIOGRAPHY;  Surgeon: Sheila Gess, MD;  Location: [facility] INV[facility] CV [facility];  Service: Cardiovascular;  Laterality: N/[name];    Soc Hx - widowed, has two daughters. Lives at home with family.    reports that she quit smoking about 54 years ago. Her smoking use included cigarettes. Her smokeless tobacco use includes snuff. She reports that she does not drink alcohol and  does not use drugs.  Allergies  Allergen Reactions   Ibuprofen Other (See Comments)    History of bleeding ulcers - contra-indicated    Benadryl [Diphenhydramine Hcl] Other (See Comments)    Worsening depression    Lipitor [Atorvastatin] Other (See Comments)    Body aches.   Baclofen Other (See Comments)    Loopy    Hydrocodone Nausea And Vomiting   Codeine Nausea And Vomiting   Morphine And Related Nausea And Vomiting   Tdap [Tetanus-Diphth-Acell Pertussis] Swelling    Redness and localized swelling at injection site     Family History  Problem Relation Age of Onset   Aneurysm Mother    Stroke Mother    Hypertension Mother    Cancer Father        lung   Heart disease Sister    Hip fracture Sister    Cancer Brother    Alzheimer's disease Sister    Cancer Sister        skin, uterine   Cancer Brother     Prior to Admission medications   Medication Sig Start Date End Date Taking? Authorizing Provider  albuterol (PROVENTIL) (2.5 MG/3ML) 0.083% nebulizer solution NEBULIZE 1 VIAL EVERY 6 TO 8 HOURS AS NEEDED FOR WHEEZING OR SHORTNESS OF BREATH 06/03/22   Sheila [name] [name], [name]  ALPRAZolam Prudy Feeler) 0.5 MG tablet Take 1 tablet (0.5 mg total) by mouth 2 (two) times daily as needed. 05/05/22   Sheila [name]  BREZTRI AEROSPHERE 160-9-4.8 MCG/ACT AERO INHALE 2 PUFFS INTO LUNGS 2 TIMES [name] DAY 06/10/22   Sheila [name]  cephALEXin (KEFLEX) 500 MG capsule Take 1 capsule (500 mg total) by mouth 2 (two) times daily. 05/05/22   Sheila [name] [name], [name]  Cholecalciferol (VITAMIN D3) 25 MCG (1000 UT) CAPS Take 1,000 Units by mouth daily.     [provider]  cinacalcet (SENSIPAR) 30 MG tablet Take 1 tablet (30 mg total) by mouth every other day. 05/19/21   Sheila Gobble, NP  CRANBERRY PO Take 1 tablet by mouth daily.    [provider]  denosumab (PROLIA) 60 MG/ML SOSY injection Inject 60 mg into the skin every 6 (six) months. 03/24/22   Sheila [name]   dexlansoprazole (DEXILANT) 60 MG capsule Take 1 capsule (60 mg total) by mouth daily. 06/15/22   Sheila [name] [name], [name]  fluticasone (CUTIVATE) 0.05 % cream Apply topically 2 (two) times daily as needed. 11/24/21   [provider]  gabapentin (NEURONTIN) 300 MG capsule TAKE ONE CAPSULE THREE TIMES DAILY 06/10/22   Sheila [name] [name], [name]  hydrOXYzine (VISTARIL) 25 MG capsule TAKE ONE CAPSULE THREE TIMES DAILY AS NEEDED 06/10/22   Sheila [name] [name], [name]  ketoconazole (NIZORAL) 2 % cream APPLY TO THE AFFECTED AREA(S) DAILY AS DIRECTED 11/05/21   Sheila [name]  levocetirizine (XYZAL) 5 MG tablet TAKE  ONE TABLET ONCE DAILY EVERY EVENING 04/30/22   Sheila [name] [name], [name]  levothyroxine (SYNTHROID) 100 MCG tablet TAKE ONE TABLET ONCE DAILY 03/14/22   Sheila [name]  magic mouthwash w/lidocaine SOLN Take 10 mLs by mouth 3 (three) times daily as needed for mouth pain. 06/15/22   Sheila [name]  metoprolol tartrate (LOPRESSOR) 25 MG tablet TAKE 1/2 TABLET TWICE DAILY 06/10/22   Sheila [name] [name], [name]  nystatin (MYCOSTATIN/NYSTOP) powder Apply 1 Application topically 3 (three) times daily. 11/07/21   Sheila [name]  Pitavastatin Calcium (LIVALO) 4 MG TABS TAKE ONE TABLET ONCE DAILY 05/28/22   Sheila [name] [name], [name]  PROLIA 60 MG/ML SOSY injection Inject into the skin. 09/16/21   [provider]  sacubitril-valsartan (ENTRESTO) 97-103 MG Take 1 tablet by mouth 2 (two) times daily. 04/16/22   Chrystie Nose, MD  sertraline (ZOLOFT) 100 MG tablet TAKE ONE TABLET ONCE DAILY 05/18/22   Sheila [name] [name], [name]  spironolactone (ALDACTONE) 25 MG tablet TAKE ONE TABLET ONCE DAILY 04/30/22   Hilty, Lisette Abu, MD  terbinafine (LAMISIL) 250 MG tablet Take 1 tablet (250 mg total) by mouth daily. 06/15/22   Sheila [name]  Tetrahydrozoline HCl (VISINE OP) Place 1 drop into both eyes daily as needed (dry eyes/itching).    [provider]  torsemide (DEMADEX) 20 MG tablet Take 1 tablet  (20 mg total) by mouth 2 (two) times daily. TAKE 2 TABLETS DAILY AS DIRECTED Strength: 20 mg 02/27/22   Sheila [name] [name], [name]  traMADol (ULTRAM) 50 MG tablet TAKE TWO TABLETS EVERY TWELVE HOURS AS NEEDED FOR PAIN 06/16/22   Sheila [name] [name], [name]  triamcinolone cream (KENALOG) 0.1 % Apply topically 2 (two) times daily as needed. 11/24/21   [provider]    Physical Exam: Vitals:   06/30/22 1600 06/30/22 1630 06/30/22 1700 06/30/22 1712  BP: (!) 90/47 (!) 77/49 (!) 98/59   Pulse: 66 66 66   Resp: (!) 23 (!) 22 19   Temp:    (!) 97.5 F (36.4 C)  TempSrc:    Oral  SpO2: 93% 92% (!) 83%   Weight:      Height:        Physical Exam Vitals and nursing note reviewed.  Constitutional:      General: She is in acute distress.     Appearance: She is obese. She is not ill-appearing or diaphoretic.  HENT:     Head: Normocephalic and atraumatic.     Mouth/Throat:     Mouth: Mucous membranes are moist.     Pharynx: Oropharynx is clear. No oropharyngeal exudate.  Eyes:     Extraocular Movements: Extraocular movements intact.     Conjunctiva/sclera: Conjunctivae normal.     Pupils: Pupils are equal, round, and reactive to light.  Cardiovascular:     Rate and Rhythm: Normal rate and regular rhythm.     Pulses: Normal pulses.     Heart sounds: No murmur heard.    Comments: Pulses-2+ radial and DP pulses easily palpated.  Heart sounds distant Pulmonary:     Effort: Pulmonary effort is normal.     Breath sounds: No wheezing, rhonchi or rales.  Abdominal:     Palpations: Abdomen is soft.     Tenderness: There is no abdominal tenderness. There is no guarding or rebound.     Hernia: [name] hernia is present.     Comments: Morbidly obese. Soft umbilical hernia  Musculoskeletal:  General: Swelling and deformity present.     Cervical back: Normal range of motion and neck supple.     Right lower leg: No edema.     Left lower leg: No edema.     Comments: Morbidly obese. Old TKR scar  left. Right ankle with moderate swelling, bruising and very tender to palpation or movement. Left ankle with marked swelling, severe bruising laterally, very tender to palpation or any movement.   Skin:    General: Skin is warm and dry.  Neurological:     General: No focal deficit present.     Mental Status: She is alert and oriented to person, place, and time.     Cranial Nerves: No cranial nerve deficit.  Psychiatric:        Behavior: Behavior normal.      Labs on Admission: I have personally reviewed following labs and imaging studies  CBC: Recent Labs  Lab 06/30/22 1131  WBC 20.6*  NEUTROABS 17.0*  HGB 12.0  HCT 36.4  MCV 86.5  PLT 167   Basic Metabolic Panel: Recent Labs  Lab 06/30/22 1131  NA 136  K 3.8  CL 100  CO2 21*  GLUCOSE 124*  BUN 26*  CREATININE 1.91*  CALCIUM 8.8*   GFR: Estimated Creatinine Clearance: 26.1 mL/min ([name]) (by C-G formula based on SCr of 1.91 mg/dL (H)). Liver Function Tests: Recent Labs  Lab 06/30/22 1131  AST 30  ALT 25  ALKPHOS 38  BILITOT 2.0*  PROT 7.5  ALBUMIN 3.7   No results for input(s): "LIPASE", "AMYLASE" in the last 168 hours. No results for input(s): "AMMONIA" in the last 168 hours. Coagulation Profile: Recent Labs  Lab 06/30/22 1131  INR 1.1   Cardiac Enzymes: No results for input(s): "CKTOTAL", "CKMB", "CKMBINDEX", "TROPONINI" in the last 168 hours. BNP (last 3 results) No results for input(s): "PROBNP" in the last 8760 hours. HbA1C: No results for input(s): "HGBA1C" in the last 72 hours. CBG: No results for input(s): "GLUCAP" in the last 168 hours. Lipid Profile: No results for input(s): "CHOL", "HDL", "LDLCALC", "TRIG", "CHOLHDL", "LDLDIRECT" in the last 72 hours. Thyroid Function Tests: No results for input(s): "TSH", "T4TOTAL", "FREET4", "T3FREE", "THYROIDAB" in the last 72 hours. Anemia Panel: No results for input(s): "VITAMINB12", "FOLATE", "FERRITIN", "TIBC", "IRON", "RETICCTPCT" in the last 72  hours. Urine analysis:    Component Value Date/Time   COLORURINE YELLOW 06/30/2022 1502   APPEARANCEUR CLOUDY ([name]) 06/30/2022 1502   APPEARANCEUR Clear 05/05/2022 1351   LABSPEC 1.014 06/30/2022 1502   PHURINE 5.0 06/30/2022 1502   GLUCOSEU NEGATIVE 06/30/2022 1502   HGBUR SMALL ([name]) 06/30/2022 1502   BILIRUBINUR NEGATIVE 06/30/2022 1502   BILIRUBINUR Negative 05/05/2022 1351   KETONESUR NEGATIVE 06/30/2022 1502   PROTEINUR 100 ([name]) 06/30/2022 1502   UROBILINOGEN 0.2 07/31/2013 1442   NITRITE NEGATIVE 06/30/2022 1502   LEUKOCYTESUR LARGE ([name]) 06/30/2022 1502    Radiological Exams on Admission: I have personally reviewed images DG Ankle Complete Right  Result Date: 06/30/2022 CLINICAL DATA:  Bilateral ankle pain and swelling after fall EXAM: RIGHT ANKLE - COMPLETE 3+ VIEW; LEFT ANKLE COMPLETE - 3+ VIEW COMPARISON:  Left ankle radiographs 12/29/2014 FINDINGS: Left: Acute mildly displaced oblique fracture through the distal left fibular metadiaphysis. The fracture line extends into the tibiofibular syndesmosis above the ankle mortise. Mild lateral subluxation of the talus in relation to the tibia with widening of the medial ankle mortise. Marked soft tissue swelling about the left foot. Right: Nondisplaced transverse fracture  through the distal right fibular metaphysis. The fracture line extends into the tibiofibular syndesmosis at the level of the ankle mortise. Marked soft tissue swelling about the lateral malleolus. IMPRESSION: 1. Left: Oblique displaced fracture through the distal left fibular metadiaphysis. Lateral subluxation of the left talus in relation to the tibia. 2. Right: Acute nondisplaced transverse fracture through the distal right fibular metaphysis. Electronically Signed   By: Minerva Fester M.D.   On: 06/30/2022 18:13   DG Ankle Complete Left  Result Date: 06/30/2022 CLINICAL DATA:  Bilateral ankle pain and swelling after fall EXAM: RIGHT ANKLE - COMPLETE 3+ VIEW; LEFT ANKLE  COMPLETE - 3+ VIEW COMPARISON:  Left ankle radiographs 12/29/2014 FINDINGS: Left: Acute mildly displaced oblique fracture through the distal left fibular metadiaphysis. The fracture line extends into the tibiofibular syndesmosis above the ankle mortise. Mild lateral subluxation of the talus in relation to the tibia with widening of the medial ankle mortise. Marked soft tissue swelling about the left foot. Right: Nondisplaced transverse fracture through the distal right fibular metaphysis. The fracture line extends into the tibiofibular syndesmosis at the level of the ankle mortise. Marked soft tissue swelling about the lateral malleolus. IMPRESSION: 1. Left: Oblique displaced fracture through the distal left fibular metadiaphysis. Lateral subluxation of the left talus in relation to the tibia. 2. Right: Acute nondisplaced transverse fracture through the distal right fibular metaphysis. Electronically Signed   By: Minerva Fester M.D.   On: 06/30/2022 18:13   DG Chest Port 1 View  Result Date: 06/30/2022 CLINICAL DATA:  Possible sepsis. EXAM: PORTABLE CHEST 1 VIEW COMPARISON:  Chest x-ray dated Jun 26, 2021. FINDINGS: Stable cardiomediastinal silhouette. Normal pulmonary vascularity. No focal consolidation, pleural effusion, or pneumothorax. No acute osseous abnormality. IMPRESSION: No active disease. Electronically Signed   By: Obie Dredge M.D.   On: 06/30/2022 11:05    EKG: I have personally reviewed EKG: SR, increased QT interval.   Assessment/Plan Principal Problem:   Sepsis secondary to UTI [facility]) Active Problems:   Prerenal azotemia   COPD (chronic obstructive pulmonary disease) (HCC)   Chronic systolic CHF (congestive heart failure) (HCC)   Closed left ankle fracture   Closed right ankle fracture   PUD (peptic ulcer disease)   GAD (generalized anxiety disorder)   Essential hypertension, benign    Assessment and Plan: * Sepsis secondary to UTI [facility]) Patient presents with hypotension,  AKI, hypothermic, with [name] leukocytosis with left shift. In transit she was given Rocephin. IN the ED she was given Vancomycin and mild fluid resuscitation. Blood and Urine cultures pending.   Plan Continue IV fluids at low rate  Admit to Step-down with both heart failure and sepsis, transfer to med-surg when stable  Continue Rocephin-adjust abx per pending cultures.   Closed right ankle fracture Patient fell at home and had post-fall pain in her ankles. X-ray revealed right non- displaced fracture thru distal fibular metaphysis and associated pain and swelling.  Plan Continue home pain medications  Fracture boot to stabalize ankle  Ortho consult place vis secure chat - Dr. Dallas Schimke.  Closed left ankle fracture Patient fell at home and had post-fall pain in her ankles. X-ray reveal left oblique displaced fracture thru distal fibular metaphysis, lateral subluxation talus and associated pain and swelling.  Plan Continue home pain medications  Fracture boot to stabalize ankle  Ortho consult place vis secure chat - Dr. Dallas Schimke.  Chronic systolic CHF (congestive heart failure) (HCC) Patient with HFrEF with last echo 04/14/22 EF 35-40%, global hypokinesis,  grade I DD. Now with BNP 991 - after receiving IV bolus resuscitation for sepsis.  Plan Continue home meds: BB, Entresto, Torsemide  Acutely administer IV lasix 40 mg q 8 x 2  COPD (chronic obstructive pulmonary disease) (HCC) COPD on LABA and SABA. At admission no respiratory distress, no acute wheezing.  Plan Continue home meds.  Supplemental oxygen to keep O2 sats > 90%  Prerenal azotemia Patient with mild elevation in Cr to 1.91 from baseline 1.3. She has already been given 1.3 L IV fluids  Plan Maintenance IV fluids while diuresing mild heart failure  F/u Bmet  Essential hypertension, benign Patient presented with hypotension 2/2 sepsis. At admit exam BP still soft.   PLan Will hold BB but continue other cardiac meds.  Close  observation  GAD (generalized anxiety disorder) Chronic problem - on several prns and daily SSRI  Plan Continue home regimen  PUD (peptic ulcer disease) No abdominal tenderness or guarding. NO report of hematemesis or change in stools.  Plan Continue PPI       DVT prophylaxis: IV heparin gtts Code Status: DNR/DNI(Do NOT Intubate) Family Communication: spoke with Alphonzo Lemmings, SO of Gelene Mink - daughter  Disposition Plan: TBD  Consults called: Ortho- Dr. Dallas Schimke  Admission status: Inpatient, Telemetry bed   Illene Regulus, MD [facility] 06/30/2022, 7:30 PM

## 2022-06-30 NOTE — Assessment & Plan Note (Signed)
COPD on LABA and SABA. At admission no respiratory distress, no acute wheezing.  Plan Continue home meds.  Supplemental oxygen to keep O2 sats > 90%

## 2022-06-30 NOTE — ED Triage Notes (Signed)
Pt brought in from home by [facility] with c/o weakness. EMS called a CODE SEPSIS at 0941 en route to the hospital. EMS reports HR 127, EtCO2 32 (originally 19 upon their arrival), BP 68/38 (after NS bolus), temp 99.4. EMS reports foul odor to urine and frequent urination. Rocephin 1gm IM given by EMS at 0941.

## 2022-06-30 NOTE — Assessment & Plan Note (Signed)
Patient fell at home and had post-fall pain in her ankles. X-ray reveal left oblique displaced fracture thru distal fibular metaphysis, lateral subluxation talus and associated pain and swelling.  Plan Continue home pain medications  Fracture boot to stabalize ankle  Ortho consult place vis secure chat - Dr. Dallas Schimke.

## 2022-06-30 NOTE — Assessment & Plan Note (Signed)
Patient fell at home and had post-fall pain in her ankles. X-ray revealed right non- displaced fracture thru distal fibular metaphysis and associated pain and swelling.  Plan Continue home pain medications  Fracture boot to stabalize ankle  Ortho consult place vis secure chat - Dr. Dallas Schimke.

## 2022-06-30 NOTE — Assessment & Plan Note (Signed)
Patient presented with hypotension 2/2 sepsis. At admit exam BP still soft.   PLan Will hold BB but continue other cardiac meds.  Close observation

## 2022-06-30 NOTE — Assessment & Plan Note (Addendum)
Patient presents with hypotension, AKI, hypothermic, with a leukocytosis with left shift. In transit she was given Rocephin. IN the ED she was given Vancomycin and mild fluid resuscitation. Blood and Urine cultures pending.   Plan Continue IV fluids at low rate  Admit to Step-down with both heart failure and sepsis, transfer to med-surg when stable  Continue Rocephin-adjust abx per pending cultures.   Addendum - BP has remained soft. Medications adjusted including reducing IV fluids 2/2 heart failure. May require pressors.

## 2022-06-30 NOTE — Assessment & Plan Note (Signed)
Patient with mild elevation in Cr to 1.91 from baseline 1.3. She has already been given 1.3 L IV fluids  Plan Maintenance IV fluids while diuresing mild heart failure  F/u Bmet

## 2022-06-30 NOTE — Assessment & Plan Note (Signed)
Chronic problem - on several prns and daily SSRI  Plan Continue home regimen

## 2022-06-30 NOTE — Assessment & Plan Note (Addendum)
Patient with HFrEF with last echo 04/14/22 EF 35-40%, global hypokinesis, grade I DD. Now with BNP 991 - after receiving IV bolus resuscitation for sepsis.  Plan home meds: adjusted due to sepsis and hypotension: hold BB, reduce Entresto due to mild AKI, continue Torsemide  Acutely administer IV lasix 40 mg q 8 x 2

## 2022-06-30 NOTE — ED Notes (Signed)
Attempted to call report receiving RN busy with patient care, will call back in 15 minutes.

## 2022-06-30 NOTE — ED Provider Notes (Signed)
[place] EMERGENCY DEPARTMENT AT [facility] Provider Note   CSN: [id number] Arrival date & time: 06/30/22  1005     History {Add pertinent medical, surgical, social history, OB history to HPI:1} Chief Complaint  Patient presents with   Weakness    Sheila [name] is a 82 y.o. female.  HPI Patient presents from home via EMS as a code sepsis. Patient describes weakness, notes that she typically has some degree of this, is possibly more so today, denies new focal pain though she notes chronic back pain which is still present. EMS reports that on their arrival the patient was weak, with tachycardia, heart rate 120s, hypotension, blood pressure 70/40.  They note malodorous urine.  En route the patient received fluids, ceftriaxone, had slight improvement in her blood pressure.    Home Medications Prior to Admission medications   Medication Sig Start Date End Date Taking? Authorizing Provider  albuterol (PROVENTIL) (2.5 MG/3ML) 0.083% nebulizer solution NEBULIZE 1 VIAL EVERY 6 TO 8 HOURS AS NEEDED FOR WHEEZING OR SHORTNESS OF BREATH 06/03/22   Jannifer Rodney A, FNP  ALPRAZolam Prudy Feeler) 0.5 MG tablet Take 1 tablet (0.5 mg total) by mouth 2 (two) times daily as needed. 05/05/22   Junie Spencer, FNP  BREZTRI AEROSPHERE 160-9-4.8 MCG/ACT AERO INHALE 2 PUFFS INTO LUNGS 2 TIMES A DAY 06/10/22   Hawks, Christy A, FNP  cephALEXin (KEFLEX) 500 MG capsule Take 1 capsule (500 mg total) by mouth 2 (two) times daily. 05/05/22   Jannifer Rodney A, FNP  Cholecalciferol (VITAMIN D3) 25 MCG (1000 UT) CAPS Take 1,000 Units by mouth daily.     [provider]  cinacalcet (SENSIPAR) 30 MG tablet Take 1 tablet (30 mg total) by mouth every other day. 05/19/21   Dani Gobble, NP  CRANBERRY PO Take 1 tablet by mouth daily.    [provider]  denosumab (PROLIA) 60 MG/ML SOSY injection Inject 60 mg into the skin every 6 (six) months. 03/24/22   Junie Spencer, FNP  dexlansoprazole  (DEXILANT) 60 MG capsule Take 1 capsule (60 mg total) by mouth daily. 06/15/22   Jannifer Rodney A, FNP  fluticasone (CUTIVATE) 0.05 % cream Apply topically 2 (two) times daily as needed. 11/24/21   [provider]  gabapentin (NEURONTIN) 300 MG capsule TAKE ONE CAPSULE THREE TIMES DAILY 06/10/22   Jannifer Rodney A, FNP  hydrOXYzine (VISTARIL) 25 MG capsule TAKE ONE CAPSULE THREE TIMES DAILY AS NEEDED 06/10/22   Jannifer Rodney A, FNP  ketoconazole (NIZORAL) 2 % cream APPLY TO THE AFFECTED AREA(S) DAILY AS DIRECTED 11/05/21   Junie Spencer, FNP  levocetirizine (XYZAL) 5 MG tablet TAKE ONE TABLET ONCE DAILY EVERY EVENING 04/30/22   Jannifer Rodney A, FNP  levothyroxine (SYNTHROID) 100 MCG tablet TAKE ONE TABLET ONCE DAILY 03/14/22   Junie Spencer, FNP  magic mouthwash w/lidocaine SOLN Take 10 mLs by mouth 3 (three) times daily as needed for mouth pain. 06/15/22   Junie Spencer, FNP  metoprolol tartrate (LOPRESSOR) 25 MG tablet TAKE 1/2 TABLET TWICE DAILY 06/10/22   Jannifer Rodney A, FNP  nystatin (MYCOSTATIN/NYSTOP) powder Apply 1 Application topically 3 (three) times daily. 11/07/21   Junie Spencer, FNP  Pitavastatin Calcium (LIVALO) 4 MG TABS TAKE ONE TABLET ONCE DAILY 05/28/22   Jannifer Rodney A, FNP  PROLIA 60 MG/ML SOSY injection Inject into the skin. 09/16/21   [provider]  sacubitril-valsartan (ENTRESTO) 97-103 MG Take 1 tablet by mouth 2 (  two) times daily. 04/16/22   Chrystie Nose, MD  sertraline (ZOLOFT) 100 MG tablet TAKE ONE TABLET ONCE DAILY 05/18/22   Jannifer Rodney A, FNP  spironolactone (ALDACTONE) 25 MG tablet TAKE ONE TABLET ONCE DAILY 04/30/22   Hilty, Lisette Abu, MD  terbinafine (LAMISIL) 250 MG tablet Take 1 tablet (250 mg total) by mouth daily. 06/15/22   Junie Spencer, FNP  Tetrahydrozoline HCl (VISINE OP) Place 1 drop into both eyes daily as needed (dry eyes/itching).    [provider]  torsemide (DEMADEX) 20 MG tablet Take 1 tablet (20 mg total) by  mouth 2 (two) times daily. TAKE 2 TABLETS DAILY AS DIRECTED Strength: 20 mg 02/27/22   Jannifer Rodney A, FNP  traMADol (ULTRAM) 50 MG tablet TAKE TWO TABLETS EVERY TWELVE HOURS AS NEEDED FOR PAIN 06/16/22   Jannifer Rodney A, FNP  triamcinolone cream (KENALOG) 0.1 % Apply topically 2 (two) times daily as needed. 11/24/21   [provider]      Allergies    Ibuprofen, Benadryl [diphenhydramine hcl], Lipitor [atorvastatin], Baclofen, Hydrocodone, Codeine, Morphine and related, and Tdap [tetanus-diphth-acell pertussis]    Review of Systems   Review of Systems  All other systems reviewed and are negative.   Physical Exam Updated Vital Signs BP (!) 96/55   Pulse 72   Temp (!) 101.1 F (38.4 C) (Rectal)   Resp (!) 25   Ht 4\' 11"  (1.499 m)   Wt 111.1 kg   SpO2 96%   BMI 49.48 kg/m  Physical Exam Vitals and nursing note reviewed.  Constitutional:      General: She is not in acute distress.    Appearance: She is well-developed. She is obese. She is ill-appearing. She is not toxic-appearing.  HENT:     Head: Normocephalic and atraumatic.  Eyes:     Conjunctiva/sclera: Conjunctivae normal.  Cardiovascular:     Rate and Rhythm: Normal rate and regular rhythm.  Pulmonary:     Effort: Pulmonary effort is normal. No respiratory distress.     Breath sounds: Normal breath sounds. No stridor.  Abdominal:     General: There is no distension.     Tenderness: There is no abdominal tenderness. There is no guarding.  Skin:    General: Skin is warm and dry.     Coloration: Skin is pale.  Neurological:     Mental Status: She is alert and oriented to person, place, and time.     Cranial Nerves: No cranial nerve deficit.  Psychiatric:        Mood and Affect: Mood normal.     ED Results / Procedures / Treatments   Labs (all labs ordered are listed, but only abnormal results are displayed) Labs Reviewed  COMPREHENSIVE METABOLIC PANEL - Abnormal; Notable for the following components:       Result Value   CO2 21 (*)    Glucose, Bld 124 (*)    BUN 26 (*)    Creatinine, Ser 1.91 (*)    Calcium 8.8 (*)    Total Bilirubin 2.0 (*)    GFR, Estimated 26 (*)    All other components within normal limits  CBC WITH DIFFERENTIAL/PLATELET - Abnormal; Notable for the following components:   WBC 20.6 (*)    Neutro Abs 17.0 (*)    Monocytes Absolute 1.8 (*)    Abs Immature Granulocytes 0.13 (*)    All other components within normal limits  APTT - Abnormal; Notable for the following components:  aPTT 23 (*)    All other components within normal limits  BRAIN NATRIURETIC PEPTIDE - Abnormal; Notable for the following components:   B Natriuretic Peptide 991.0 (*)    All other components within normal limits  CULTURE, BLOOD (ROUTINE X 2)  CULTURE, BLOOD (ROUTINE X 2)  LACTIC ACID, PLASMA  LACTIC ACID, PLASMA  PROTIME-INR  URINALYSIS, W/ REFLEX TO CULTURE (INFECTION SUSPECTED)    EKG EKG Interpretation  Date/Time:  Tuesday Jun 30 2022 10:21:55 EDT Ventricular Rate:  83 PR Interval:  187 QRS Duration: 90 QT Interval:  429 QTC Calculation: 505 R Axis:   28 Text Interpretation: Sinus rhythm Low voltage, precordial leads Prolonged QT interval Baseline wander Artifact Confirmed by Gerhard Munch [phone]) on 06/30/2022 11:04:59 AM  Radiology DG Chest Port 1 View  Result Date: 06/30/2022 CLINICAL DATA:  Possible sepsis. EXAM: PORTABLE CHEST 1 VIEW COMPARISON:  Chest x-ray dated Jun 26, 2021. FINDINGS: Stable cardiomediastinal silhouette. Normal pulmonary vascularity. No focal consolidation, pleural effusion, or pneumothorax. No acute osseous abnormality. IMPRESSION: No active disease. Electronically Signed   By: Obie Dredge M.D.   On: 06/30/2022 11:05    Procedures Procedures  {Document cardiac monitor, telemetry assessment procedure when appropriate:1}  Medications Ordered in ED Medications  lactated ringers bolus 1,000 mL (0 mLs Intravenous Stopped 06/30/22 1503)   acetaminophen (TYLENOL) tablet 1,000 mg (1,000 mg Oral Given 06/30/22 1216)    ED Course/ Medical Decision Making/ A&P   {   Click here for ABCD2, HEART and other calculatorsREFRESH Note before signing :1}                          Medical Decision Making Patient with multiple medical issues including morbid obesity, hypertension, presents with weakness, and per EMS is hypotensive, tachycardic.  On arrival the patient is found to be febrile as well.  Patient received empiric antibiotics by EMS, and on arrival additional evaluation including labs, urinalysis, x-ray started, patient placed on continuous monitoring.  Blood pressure has improved. Cardiac 85 sinus normal Pulse ox 94% room air borderline   Amount and/or Complexity of Data Reviewed Independent Historian: EMS External Data Reviewed: notes. Labs: ordered. Decision-making details documented in ED Course. Radiology: ordered and independent interpretation performed. Decision-making details documented in ED Course. ECG/medicine tests: ordered and independent interpretation performed. Decision-making details documented in ED Course.  Risk OTC drugs. Prescription drug management. Decision regarding hospitalization.  Echocardiogram from February of this year below: 1. Left ventricular ejection fraction, by estimation, is 35 to 40%. The  left ventricle has moderately decreased function. The left ventricle  demonstrates global hypokinesis. Left ventricular diastolic parameters are  consistent with Grade I diastolic  dysfunction (impaired relaxation).   2. Right ventricular systolic function is mildly reduced. The right  ventricular size is mildly enlarged. There is normal pulmonary artery  systolic pressure. The estimated right ventricular systolic pressure is  25.1 mmHg.   3. The mitral valve is normal in structure. No evidence of mitral valve  regurgitation. No evidence of mitral stenosis.   4. The aortic valve is tricuspid.  There is mild calcification of the  aortic valve. Aortic valve regurgitation is not visualized. No aortic  stenosis is present.   5. The inferior vena cava is normal in size with greater than 50%  respiratory variability, suggesting right atrial pressure of 3 mmHg.    3:45 PM Patient much more comfortable.  Blood pressure improved, and now has MAP 75.  Labs notable for no lactic acidosis but substantial leukocytosis nearly 20,000.  BNP almost 1000, given the patient's history of heart failure with an EF of 40% earlier this year fluids have been provided judiciously. She had received ceftriaxone per EMS, vancomycin here for concern for SIRS versus sepsis.  Suspicion for urinary tract source given her history, absence of cutaneous findings, absence of pneumonia on x-ray.  With preserved mentation, little evidence for septic shock.  Similarly, the patient had no lactic acidosis. With dehydration, obtaining urine was delayed.  An initial attempt at catheterizing for sample soon after the patient's arrival was unsuccessful, and after fluid resuscitation, finally a sample was obtained.   {Document critical care time when appropriate:1} {Document review of labs and clinical decision tools ie heart score, Chads2Vasc2 etc:1}  {Document your independent review of radiology images, and any outside records:1} {Document your discussion with family members, caretakers, and with consultants:1} {Document social determinants of health affecting pt's care:1} {Document your decision making why or why not admission, treatments were needed:1} Final Clinical Impression(s) / ED Diagnoses Final diagnoses:  Severe sepsis (HCC)   CRITICAL CARE Performed by: Gerhard Munch Total critical care time: 40 minutes Critical care time was exclusive of separately billable procedures and treating other patients. Critical care was necessary to treat or prevent imminent or life-threatening deterioration. Critical care was  time spent personally by me on the following activities: development of treatment plan with patient and/or surrogate as well as nursing, discussions with consultants, evaluation of patient's response to treatment, examination of patient, obtaining history from patient or surrogate, ordering and performing treatments and interventions, ordering and review of laboratory studies, ordering and review of radiographic studies, pulse oximetry and re-evaluation of patient's condition.

## 2022-06-30 NOTE — Subjective & Objective (Signed)
Sheila [name], an 82 y/o woman with HFrEF, chronic joint pain, anxiety, PUD, COPD by report has been ill for several days. Today she felt worse and weaker. She did suffer a fall landing on her ankles with subsequent pain. Due to her illness and ankle pain EMS activated: EMS called a CODE SEPSIS at 0941 en route to the hospital. EMS reports HR 127, EtCO2 32 (originally 19 upon their arrival), BP 68/38 (after NS bolus), temp 99.4. EMS reports foul odor to urine and frequent urination. Rocephin 1gm IM given by EMS at 0941. She was transported to [facility] for further evaluation.

## 2022-06-30 NOTE — Assessment & Plan Note (Signed)
No abdominal tenderness or guarding. NO report of hematemesis or change in stools.  Plan Continue PPI

## 2022-07-01 ENCOUNTER — Inpatient Hospital Stay (HOSPITAL_COMMUNITY): Payer: 59

## 2022-07-01 DIAGNOSIS — S82842A Displaced bimalleolar fracture of left lower leg, initial encounter for closed fracture: Secondary | ICD-10-CM | POA: Diagnosis not present

## 2022-07-01 DIAGNOSIS — S82831A Other fracture of upper and lower end of right fibula, initial encounter for closed fracture: Secondary | ICD-10-CM | POA: Diagnosis not present

## 2022-07-01 DIAGNOSIS — N390 Urinary tract infection, site not specified: Secondary | ICD-10-CM | POA: Diagnosis not present

## 2022-07-01 DIAGNOSIS — A419 Sepsis, unspecified organism: Secondary | ICD-10-CM | POA: Diagnosis not present

## 2022-07-01 LAB — CBC
HCT: 31.9 % — ABNORMAL LOW (ref 36.0–46.0)
Hemoglobin: 10.8 g/dL — ABNORMAL LOW (ref 12.0–15.0)
MCH: 29.3 pg (ref 26.0–34.0)
MCHC: 33.9 g/dL (ref 30.0–36.0)
MCV: 86.4 fL (ref 80.0–100.0)
Platelets: 174 10*3/uL (ref 150–400)
RBC: 3.69 MIL/uL — ABNORMAL LOW (ref 3.87–5.11)
RDW: 14.5 % (ref 11.5–15.5)
WBC: 17 10*3/uL — ABNORMAL HIGH (ref 4.0–10.5)
nRBC: 0 % (ref 0.0–0.2)

## 2022-07-01 LAB — PROCALCITONIN: Procalcitonin: 0.97 ng/mL

## 2022-07-01 LAB — BASIC METABOLIC PANEL
Anion gap: 12 (ref 5–15)
BUN: 35 mg/dL — ABNORMAL HIGH (ref 8–23)
CO2: 21 mmol/L — ABNORMAL LOW (ref 22–32)
Calcium: 8 mg/dL — ABNORMAL LOW (ref 8.9–10.3)
Chloride: 99 mmol/L (ref 98–111)
Creatinine, Ser: 2.24 mg/dL — ABNORMAL HIGH (ref 0.44–1.00)
GFR, Estimated: 22 mL/min — ABNORMAL LOW (ref >60)
Glucose, Bld: 130 mg/dL — ABNORMAL HIGH (ref 70–99)
Potassium: 3.6 mmol/L (ref 3.5–5.1)
Sodium: 132 mmol/L — ABNORMAL LOW (ref 135–145)

## 2022-07-01 LAB — BRAIN NATRIURETIC PEPTIDE: B Natriuretic Peptide: 117 pg/mL — ABNORMAL HIGH (ref 0.0–100.0)

## 2022-07-01 LAB — CORTISOL-AM, BLOOD: Cortisol - AM: 21.9 ug/dL (ref 6.7–22.6)

## 2022-07-01 LAB — PROTIME-INR
INR: 1.1 (ref 0.8–1.2)
Prothrombin Time: 14.5 seconds (ref 11.4–15.2)

## 2022-07-01 LAB — MRSA NEXT GEN BY PCR, NASAL: MRSA by PCR Next Gen: NOT DETECTED

## 2022-07-01 MED ORDER — FENTANYL CITRATE PF 50 MCG/ML IJ SOSY
25.0000 ug | PREFILLED_SYRINGE | INTRAMUSCULAR | Status: DC | PRN
  Administered 2022-07-02 – 2022-07-03 (×4): 25 ug via INTRAVENOUS
  Filled 2022-07-01 (×4): qty 1

## 2022-07-01 MED ORDER — ONDANSETRON 4 MG PO TBDP
4.0000 mg | ORAL_TABLET | Freq: Three times a day (TID) | ORAL | Status: DC | PRN
  Administered 2022-07-01 – 2022-07-09 (×7): 4 mg via ORAL
  Filled 2022-07-01 (×7): qty 1

## 2022-07-01 MED ORDER — OXYCODONE HCL 5 MG PO TABS
2.5000 mg | ORAL_TABLET | ORAL | Status: DC | PRN
  Administered 2022-07-01 – 2022-07-09 (×24): 5 mg via ORAL
  Filled 2022-07-01 (×25): qty 1

## 2022-07-01 MED ORDER — FENTANYL CITRATE PF 50 MCG/ML IJ SOSY
25.0000 ug | PREFILLED_SYRINGE | INTRAMUSCULAR | Status: DC | PRN
  Administered 2022-07-01 (×3): 25 ug via INTRAVENOUS
  Filled 2022-07-01 (×3): qty 1

## 2022-07-01 NOTE — Consult Note (Signed)
ORTHOPAEDIC CONSULTATION  REQUESTING PHYSICIAN: Tyrone Nine, MD  ASSESSMENT AND PLAN: 82 y.o. female with the following: Bilateral ankle fractures.  Right distal fibula fracture to be treated without surgery.  Walking boot and weightbearing as tolerated.  Left bimalleolar ankle fracture which will require operative fixation once the patient is stabilized.  Orthopedics recommends admission to a medical service and we will provide consultation and follow along.  Once patient's medical status has improved, we can consider operative fixation of the left bimalleolar ankle fracture.  Will discuss with anesthesia given her medical comorbidities, to ensure that she is healthy enough to go to surgery.  This does not need to be done on an urgent basis.  However, I do think it is reasonable to take care of her injuries while she is admitted.  - Weight Bearing Status/Activity: Nonweightbearing on the right leg.  Weightbearing as tolerated on the left leg.  Walking boots are appropriate at this time.  Okay for short breaks.  - Additional recommended labs/tests: None  -VTE Prophylaxis: At the discretion of the primary team.  Will communicate the plan for surgery, with a goal of stopping anticoagulation prior to surgery.  - Pain control: As needed  - Follow-up plan: To be determined  -Procedures: Likely operative fixation of left bimalleolar ankle fracture.  Chief Complaint: Bilateral ankle pain.  HPI: Sheila [name] is a 82 y.o. female with bilateral ankle pain.  She reports that her legs gave way yesterday in her kitchen, and she fell.  She had immediate pain.  She presented emergency department, was noted to be in urosepsis.  X-rays of the ankles demonstrates bilateral ankle fractures, with displacement of the left ankle.  Her pain is controlled when she is not moving.  She does not try to ambulate.  She is admitted to the ICU for treatment for sepsis.  Past Medical History:  Diagnosis Date    Allergy    Bell's palsy    Cancer (HCC)    skin CA on face   Cataract    Chest pain 07/2009   Myoveiw stress test negative.   Depression    Diverticulosis of colon    DJD (degenerative joint disease) of knee    Dyspnea    on exertion   Family history of adverse reaction to anesthesia    sister ha nausea/vomiting   Gastric ulcer with hemorrhage 01/14/2011   GERD (gastroesophageal reflux disease)    Headache    History of fractured pelvis    Hypercholesteremia    Hypertension    Hypothyroidism    LV dysfunction    UTI (lower urinary tract infection)    Past Surgical History:  Procedure Laterality Date   APPENDECTOMY     BREAST BIOPSY Left    ESOPHAGOGASTRODUODENOSCOPY  01/14/2011   Procedure: ESOPHAGOGASTRODUODENOSCOPY (EGD);  Surgeon: Malissa Hippo, MD;  Location: [facility];  Service: Endoscopy;  Laterality: N/A;   HIP SURGERY     pelvis   JOINT REPLACEMENT     KNEE SURGERY  04/2010.   Total left knee replacement   LAPAROSCOPIC APPENDECTOMY N/A 08/01/2013   Procedure: APPENDECTOMY LAPAROSCOPIC;  Surgeon: Dalia Heading, MD;  Location: [facility];  Service: General;  Laterality: N/A;   RIGHT/LEFT HEART CATH AND CORONARY ANGIOGRAPHY N/A 10/02/2019   Procedure: RIGHT/LEFT HEART CATH AND CORONARY ANGIOGRAPHY;  Surgeon: Runell Gess, MD;  Location: [facility];  Service: Cardiovascular;  Laterality: N/A;   Social History   Socioeconomic History  Marital status: Widowed    Spouse name: Not on file   Number of children: 2   Years of education: 0   Highest education level: Never attended school  Occupational History   Occupation: retired  Tobacco Use   Smoking status: Former    Types: Cigarettes    Quit date: 02/24/1968    Years since quitting: 54.3   Smokeless tobacco: Current    Types: Snuff   Tobacco comments:    quit yrs and yrs ago when she was very young  Advertising account planner   Vaping Use: Never used  Substance and Sexual Activity   Alcohol use: No   Drug use:  No   Sexual activity: Not Currently    Birth control/protection: Post-menopausal  Other Topics Concern   Not on file  Social History Narrative   Lives alone, but has 24 hour caregiver, palliative care   Daughter lives nearby and helps her with paying bills, shopping, etc.   Social Determinants of Health   Financial Resource Strain: Low Risk  (05/13/2022)   Overall Financial Resource Strain (CARDIA)    Difficulty of Paying Living Expenses: Not hard at all  Food Insecurity: No Food Insecurity (05/13/2022)   Hunger Vital Sign    Worried About Running Out of Food in the Last Year: Never true    Ran Out of Food in the Last Year: Never true  Transportation Needs: No Transportation Needs (05/13/2022)   PRAPARE - Administrator, Civil Service (Medical): No    Lack of Transportation (Non-Medical): No  Physical Activity: Insufficiently Active (05/13/2022)   Exercise Vital Sign    Days of Exercise per Week: 3 days    Minutes of Exercise per Session: 10 min  Stress: No Stress Concern Present (05/13/2022)   [employer] of Occupational Health - Occupational Stress Questionnaire    Feeling of Stress : Not at all  Social Connections: Socially Isolated (05/13/2022)   Social Connection and Isolation Panel [NHANES]    Frequency of Communication with Friends and Family: More than three times a week    Frequency of Social Gatherings with Friends and Family: More than three times a week    Attends Religious Services: Never    Database administrator or Organizations: No    Attends Banker Meetings: Never    Marital Status: Widowed   Family History  Problem Relation Age of Onset   Aneurysm Mother    Stroke Mother    Hypertension Mother    Cancer Father        lung   Heart disease Sister    Hip fracture Sister    Cancer Brother    Alzheimer's disease Sister    Cancer Sister        skin, uterine   Cancer Brother    Allergies  Allergen Reactions   Ibuprofen Other  (See Comments)    History of bleeding ulcers - contra-indicated    Benadryl [Diphenhydramine Hcl] Other (See Comments)    Worsening depression    Lipitor [Atorvastatin] Other (See Comments)    Body aches.   Baclofen Other (See Comments)    Loopy    Hydrocodone Nausea And Vomiting   Codeine Nausea And Vomiting   Morphine And Related Nausea And Vomiting   Tdap [Tetanus-Diphth-Acell Pertussis] Swelling    Redness and localized swelling at injection site    Prior to Admission medications   Medication Sig Start Date End Date Taking? Authorizing Provider  albuterol (PROVENTIL) (  2.5 MG/3ML) 0.083% nebulizer solution NEBULIZE 1 VIAL EVERY 6 TO 8 HOURS AS NEEDED FOR WHEEZING OR SHORTNESS OF BREATH 06/03/22   Jannifer Rodney A, FNP  ALPRAZolam Prudy Feeler) 0.5 MG tablet Take 1 tablet (0.5 mg total) by mouth 2 (two) times daily as needed. 05/05/22   Junie Spencer, FNP  BREZTRI AEROSPHERE 160-9-4.8 MCG/ACT AERO INHALE 2 PUFFS INTO LUNGS 2 TIMES A DAY 06/10/22   Hawks, Christy A, FNP  cephALEXin (KEFLEX) 500 MG capsule Take 1 capsule (500 mg total) by mouth 2 (two) times daily. 05/05/22   Jannifer Rodney A, FNP  Cholecalciferol (VITAMIN D3) 25 MCG (1000 UT) CAPS Take 1,000 Units by mouth daily.     [provider]  cinacalcet (SENSIPAR) 30 MG tablet Take 1 tablet (30 mg total) by mouth every other day. 05/19/21   Dani Gobble, NP  CRANBERRY PO Take 1 tablet by mouth daily.    [provider]  denosumab (PROLIA) 60 MG/ML SOSY injection Inject 60 mg into the skin every 6 (six) months. 03/24/22   Junie Spencer, FNP  dexlansoprazole (DEXILANT) 60 MG capsule Take 1 capsule (60 mg total) by mouth daily. 06/15/22   Jannifer Rodney A, FNP  fluticasone (CUTIVATE) 0.05 % cream Apply topically 2 (two) times daily as needed. 11/24/21   [provider]  gabapentin (NEURONTIN) 300 MG capsule TAKE ONE CAPSULE THREE TIMES DAILY 06/10/22   Jannifer Rodney A, FNP  hydrOXYzine (VISTARIL) 25 MG  capsule TAKE ONE CAPSULE THREE TIMES DAILY AS NEEDED 06/10/22   Jannifer Rodney A, FNP  ketoconazole (NIZORAL) 2 % cream APPLY TO THE AFFECTED AREA(S) DAILY AS DIRECTED 11/05/21   Junie Spencer, FNP  levocetirizine (XYZAL) 5 MG tablet TAKE ONE TABLET ONCE DAILY EVERY EVENING 04/30/22   Jannifer Rodney A, FNP  levothyroxine (SYNTHROID) 100 MCG tablet TAKE ONE TABLET ONCE DAILY 03/14/22   Junie Spencer, FNP  magic mouthwash w/lidocaine SOLN Take 10 mLs by mouth 3 (three) times daily as needed for mouth pain. 06/15/22   Junie Spencer, FNP  metoprolol tartrate (LOPRESSOR) 25 MG tablet TAKE 1/2 TABLET TWICE DAILY 06/10/22   Jannifer Rodney A, FNP  nystatin (MYCOSTATIN/NYSTOP) powder Apply 1 Application topically 3 (three) times daily. 11/07/21   Junie Spencer, FNP  Pitavastatin Calcium (LIVALO) 4 MG TABS TAKE ONE TABLET ONCE DAILY 05/28/22   Jannifer Rodney A, FNP  PROLIA 60 MG/ML SOSY injection Inject into the skin. 09/16/21   [provider]  sacubitril-valsartan (ENTRESTO) 97-103 MG Take 1 tablet by mouth 2 (two) times daily. 04/16/22   Chrystie Nose, MD  sertraline (ZOLOFT) 100 MG tablet TAKE ONE TABLET ONCE DAILY 05/18/22   Jannifer Rodney A, FNP  spironolactone (ALDACTONE) 25 MG tablet TAKE ONE TABLET ONCE DAILY 04/30/22   Hilty, Lisette Abu, MD  terbinafine (LAMISIL) 250 MG tablet Take 1 tablet (250 mg total) by mouth daily. 06/15/22   Junie Spencer, FNP  Tetrahydrozoline HCl (VISINE OP) Place 1 drop into both eyes daily as needed (dry eyes/itching).    [provider]  torsemide (DEMADEX) 20 MG tablet Take 1 tablet (20 mg total) by mouth 2 (two) times daily. TAKE 2 TABLETS DAILY AS DIRECTED Strength: 20 mg 02/27/22   Jannifer Rodney A, FNP  traMADol (ULTRAM) 50 MG tablet TAKE TWO TABLETS EVERY TWELVE HOURS AS NEEDED FOR PAIN 06/16/22   Jannifer Rodney A, FNP  triamcinolone cream (KENALOG) 0.1 % Apply topically 2 (two) times daily as needed.  11/24/21   [provider]   DG Ankle  Complete Right  Result Date: 06/30/2022 CLINICAL DATA:  Bilateral ankle pain and swelling after fall EXAM: RIGHT ANKLE - COMPLETE 3+ VIEW; LEFT ANKLE COMPLETE - 3+ VIEW COMPARISON:  Left ankle radiographs 12/29/2014 FINDINGS: Left: Acute mildly displaced oblique fracture through the distal left fibular metadiaphysis. The fracture line extends into the tibiofibular syndesmosis above the ankle mortise. Mild lateral subluxation of the talus in relation to the tibia with widening of the medial ankle mortise. Marked soft tissue swelling about the left foot. Right: Nondisplaced transverse fracture through the distal right fibular metaphysis. The fracture line extends into the tibiofibular syndesmosis at the level of the ankle mortise. Marked soft tissue swelling about the lateral malleolus. IMPRESSION: 1. Left: Oblique displaced fracture through the distal left fibular metadiaphysis. Lateral subluxation of the left talus in relation to the tibia. 2. Right: Acute nondisplaced transverse fracture through the distal right fibular metaphysis. Electronically Signed   By: Minerva Fester M.D.   On: 06/30/2022 18:13   DG Ankle Complete Left  Result Date: 06/30/2022 CLINICAL DATA:  Bilateral ankle pain and swelling after fall EXAM: RIGHT ANKLE - COMPLETE 3+ VIEW; LEFT ANKLE COMPLETE - 3+ VIEW COMPARISON:  Left ankle radiographs 12/29/2014 FINDINGS: Left: Acute mildly displaced oblique fracture through the distal left fibular metadiaphysis. The fracture line extends into the tibiofibular syndesmosis above the ankle mortise. Mild lateral subluxation of the talus in relation to the tibia with widening of the medial ankle mortise. Marked soft tissue swelling about the left foot. Right: Nondisplaced transverse fracture through the distal right fibular metaphysis. The fracture line extends into the tibiofibular syndesmosis at the level of the ankle mortise. Marked soft tissue swelling about the lateral malleolus. IMPRESSION: 1.  Left: Oblique displaced fracture through the distal left fibular metadiaphysis. Lateral subluxation of the left talus in relation to the tibia. 2. Right: Acute nondisplaced transverse fracture through the distal right fibular metaphysis. Electronically Signed   By: Minerva Fester M.D.   On: 06/30/2022 18:13   DG Chest Port 1 View  Result Date: 06/30/2022 CLINICAL DATA:  Possible sepsis. EXAM: PORTABLE CHEST 1 VIEW COMPARISON:  Chest x-ray dated Jun 26, 2021. FINDINGS: Stable cardiomediastinal silhouette. Normal pulmonary vascularity. No focal consolidation, pleural effusion, or pneumothorax. No acute osseous abnormality. IMPRESSION: No active disease. Electronically Signed   By: Obie Dredge M.D.   On: 06/30/2022 11:05    Family History Reviewed and non-contributory, no pertinent history of problems with bleeding or anesthesia    Review of Systems + fevers or chills No numbness or tingling No chest pain + shortness of breath No bowel or bladder dysfunction No GI distress No headaches    OBJECTIVE  Vitals:Patient Vitals for the past 8 hrs:  BP Temp Temp src Pulse Resp SpO2  07/01/22 0830 -- -- -- -- -- 100 %  07/01/22 0700 105/62 99.7 F (37.6 C) Oral 94 (!) 27 96 %  07/01/22 0500 (!) 104/49 -- -- 95 (!) 26 97 %  07/01/22 0423 (!) 120/52 100 F (37.8 C) Oral 98 14 99 %  07/01/22 0300 (!) 85/58 -- -- 89 (!) 22 97 %  07/01/22 0200 129/79 -- -- 85 (!) 24 96 %  07/01/22 0100 (!) 129/49 -- -- 89 (!) 27 97 %   General: Alert, no acute distress Cardiovascular: Warm extremities noted Respiratory: No cyanosis, no use of accessory musculature GI: No organomegaly, abdomen is soft and non-tender Skin: No lesions  in the area of chief complaint other than those listed below in MSK exam.  Neurologic: Sensation intact distally save for the below mentioned MSK exam Psychiatric: Patient is competent for consent with normal mood and affect Lymphatic: No swelling obvious and reported other  than the area involved in the exam below Extremities   RLE: Evaluation of the right ankle demonstrates diffuse swelling.  Tenderness to palpation laterally.  There is some bruising.  Toes are warm and well-perfused.  2+ DP pulse.  Sensation is intact over the dorsum of the foot. LLE: Evaluation left ankle demonstrates diffuse swelling.  There is ecchymosis medial and lateral.  Tenderness to palpation over the medial and lateral ankle.  Active motion intact.  Sensation intact over the dorsum of the foot.  Toes warm well-perfused.  2+ DP pulse.    Test Results Imaging X-ray of the right ankle demonstrates a non-displaced fracture of the distal fibula.  X-ray of the left ankle demonstrates an oblique fracture through the distal fibula, with some lateral translation of the talus.  Small avulsion fracture of the medial mall.  Labs cbc Recent Labs    06/30/22 1131 07/01/22 0435  WBC 20.6* 17.0*  HGB 12.0 10.8*  HCT 36.4 31.9*  PLT 167 174      Labs coag Recent Labs    06/30/22 1131 07/01/22 0435  INR 1.1 1.1    Recent Labs    06/30/22 1131 07/01/22 0435  NA 136 132*  K 3.8 3.6  CL 100 99  CO2 21* 21*  GLUCOSE 124* 130*  BUN 26* 35*  CREATININE 1.91* 2.24*  CALCIUM 8.8* 8.0*

## 2022-07-01 NOTE — TOC Initial Note (Signed)
Transition of Care [facility]) - Initial/Assessment Note    Patient Details  Name: Sheila [name] MRN: [id number] Date of Birth: 03-02-1940  [facility] [place] CM/SW Contact:    Villa Herb, LCSWA Phone Number: 07/01/2022, 10:50 AM  Clinical Narrative:                 Pt is high risk for readmission. CSW spoke with pts daughter to complete assessment. Pt has CNAs in the home daily and pts daughter stays in the home at night with pt. Pts daughters are able to provide transportation for pts to appointments. Pt has had HH in the past. Pt has a wheelchair and walker though pts daughter states they are both too small for pt. Pt sleeps in a lift recliner chair in the home. TOC to follow.   Expected Discharge Plan:  (Unknown at this time, PT will work will pt when more stable.) Barriers to Discharge: Continued Medical Work up   Patient Goals and CMS Choice Patient states their goals for this hospitalization and ongoing recovery are:: get better CMS Medicare.gov Compare Post Acute Care list provided to:: Patient Represenative (must comment) Choice offered to / list presented to : Adult Children      Expected Discharge Plan and Services In-house Referral: Clinical Social Work Discharge Planning Services: CM Consult   Living arrangements for the past 2 months: Single Family Home                                      Prior Living Arrangements/Services Living arrangements for the past 2 months: Single Family Home Lives with:: Adult Children Patient language and need for interpreter reviewed:: Yes Do you feel safe going back to the place where you live?: Yes      Need for Family Participation in Patient Care: Yes (Comment) Care giver support system in place?: Yes (comment) Current home services: DME Criminal Activity/Legal Involvement Pertinent to Current Situation/Hospitalization: No - Comment as needed  Activities of Daily Living Home Assistive Devices/Equipment: Walker  (specify type), Other (Comment) (lift chair) ADL Screening (condition at time of admission) Patient's cognitive ability adequate to safely complete daily activities?: Yes Is the patient deaf or have difficulty hearing?: No Does the patient have difficulty seeing, even when wearing glasses/contacts?: No Does the patient have difficulty concentrating, remembering, or making decisions?: No Patient able to express need for assistance with ADLs?: Yes Does the patient have difficulty dressing or bathing?: Yes Independently performs ADLs?: No Communication: Independent Dressing (OT): Needs assistance Is this a change from baseline?: Pre-admission baseline Grooming: Needs assistance Is this a change from baseline?: Pre-admission baseline Feeding: Independent Bathing: Needs assistance Is this a change from baseline?: Pre-admission baseline Toileting: Needs assistance Is this a change from baseline?: Pre-admission baseline In/Out Bed: Needs assistance, Independent with device (comment) Is this a change from baseline?: Pre-admission baseline Walks in Home: Needs assistance, Independent with device (comment) Is this a change from baseline?: Pre-admission baseline Does the patient have difficulty walking or climbing stairs?: Yes Weakness of Legs: Both Weakness of Arms/Hands: None  Permission Sought/Granted                  Emotional Assessment         Alcohol / Substance Use: Not Applicable Psych Involvement: No (comment)  Admission diagnosis:  Sepsis secondary to UTI (HCC) [A41.9, N39.0] Severe sepsis (HCC) [A41.9, R65.20] Patient Active Problem  List   Diagnosis Date Noted   Closed bimalleolar fracture of left ankle 07/01/2022   Closed left ankle fracture 06/30/2022   Closed fracture of right distal fibula 06/30/2022   Moderate episode of recurrent major depressive disorder (HCC) 11/07/2021   Sepsis secondary to UTI (HCC) 06/26/2021   Acute metabolic encephalopathy 06/26/2021    SIRS (systemic inflammatory response syndrome) (HCC) 03/06/2021   Chronic systolic CHF (congestive heart failure) (HCC) 03/06/2021   Urinary incontinence 06/03/2020   Chronic kidney disease, stage 3 unspecified (HCC) 04/18/2020   Primary hyperparathyroidism (HCC) 04/02/2020   LV dysfunction 10/02/2019   NICM (nonischemic cardiomyopathy) (HCC) 10/02/2019   Shortness of breath 08/29/2019   Atypical chest pain 08/29/2019   Controlled substance agreement signed 07/26/2019   Osteoarthritis of right shoulder 02/24/2018   Smokeless tobacco use 11/11/2017   COPD (chronic obstructive pulmonary disease) (HCC) 09/09/2017   Unilateral primary osteoarthritis, right knee 03/04/2017   Spondylolisthesis of lumbar region 08/24/2016   Benzodiazepine dependence (HCC) 04/29/2015   Pain medication agreement signed 04/29/2015   Chronic back pain 04/29/2015   Osteoarthritis 04/29/2015   Metabolic syndrome 04/29/2015   Morbid obesity (HCC) 10/18/2014   Neuropathy 10/18/2014   Hypokalemia 10/18/2014   Depressed 09/05/2013   Osteoporosis 06/16/2012   PUD (peptic ulcer disease) 06/16/2012   GAD (generalized anxiety disorder) 06/16/2012   Hyperlipidemia 06/16/2012   DJD (degenerative joint disease) 06/16/2012   Hypercalcemia 06/16/2012   Essential hypertension, benign 06/16/2012   Gastric ulcer with hemorrhage 01/14/2011   Diverticulosis of colon 01/13/2011   Prerenal azotemia 01/13/2011   Leukocytosis 01/13/2011   Sacral insufficiency fracture 01/13/2011   Hypothyroidism 01/13/2011   Orthostatic lightheadedness 01/13/2011   ABNORMAL EKG 07/16/2009   PCP:  Junie Spencer, FNP Pharmacy:   [facility] [place], [place] - [address] [place] [address] Phone: [phone] Fax: [phone]     Social Determinants of Health (SDOH) Social History: SDOH Screenings   Food Insecurity: No Food Insecurity (05/13/2022)  Housing: Low Risk  (05/13/2022)   Transportation Needs: No Transportation Needs (05/13/2022)  Utilities: Not At Risk (05/13/2022)  Alcohol Screen: Low Risk  (05/13/2022)  Depression (PHQ2-9): Low Risk  (05/13/2022)  Financial Resource Strain: Low Risk  (05/13/2022)  Physical Activity: Insufficiently Active (05/13/2022)  Social Connections: Socially Isolated (05/13/2022)  Stress: No Stress Concern Present (05/13/2022)  Tobacco Use: High Risk (06/30/2022)   SDOH Interventions:     Readmission Risk Interventions    07/01/2022   10:48 AM 06/27/2021   12:48 PM  Readmission Risk Prevention Plan  Transportation Screening Complete Complete  PCP or Specialist Appt within 3-5 Days  Not Complete  HRI or Home Care Consult Complete Complete  Social Work Consult for Recovery Care Planning/Counseling Complete Complete  Palliative Care Screening Not Applicable Not Applicable  Medication Review Oceanographer) Complete Complete

## 2022-07-01 NOTE — Progress Notes (Signed)
[facility] PROGRESS NOTE  Sheila [name] (DOB: 1940/10/29) ZOX:[id number] PCP: Junie Spencer, FNP  Brief Narrative: Sheila [name] is an 82 y.o. female with a history of COPD, HFrEF, chronic pain, anxiety, PUD who presented to the ED on 06/30/2022 due to worsening diffuse weakness culminating in a fall at home and subsequent inability to bear weight. EMS reported tachycardia and hypotension. Due to reports of change in urination and dysuria, ceftriaxone was given IM by EMS en route along with IV fluids. In the ED, T 97.5  BP 77/49 - to 95/59 after fluid bolus. HR 66 RR 19. Patient appeared ill, had bruised ankles that were tender. Code sepsis initiated in the field as above. IN ED she received 1 L IVF then LR at 150/hr. She receved Rocephin in the field and Vancomycin in ED. Lab: glucose 124, WBC 20.6 with 82/8/9 BUN 26, Cr 1.91 (baseline 24/1.3) lactic acid 1.9 to 1.7 after resuscitation. BNP 991 (baseline 54) U/A - cloudy, LE lrg, many bacteria/hpf, >50 wbc/hpf. X-ray revealed bilateral ankle fractures. [facility] called to admit for continued management of urosepsis and chronic disease mgt.  Subjective: Pain not controlled. Fentanyl ineffective, tramadol ineffective. Extensive discussion with the patient, and her grandson, then later with her daughter, then later with her other daughter with whom she lives all at the bedside regarding patient's operative possibilities. They have reservations about that with her cardiopulmonary function. They also worry that she'll have bad reaction to any opioid pain medications, has never tried oxycodone.   Objective: BP (!) 119/57   Pulse 87   Temp 99.1 F (37.3 C) (Oral)   Resp (!) 24   Ht 4\' 11"  (1.499 m)   Wt 112 kg   SpO2 97%   BMI 49.87 kg/m   Gen: Obese female in evident discomfort at rest Pulm: Clear, nonlabored  CV: RRR, no MRG or pitting edema GI: Soft, NT, ND, +BS Neuro: Alert and oriented. No new focal deficits. Ext: Warm, dry. Tender bruising  and diffuse edema to ankles. NVI bilateral feet. Skin: No other rashes, lesions or ulcers on visualized skin   Assessment & Plan: Principal Problem:   Sepsis secondary to UTI [facility]) Active Problems:   Prerenal azotemia   COPD (chronic obstructive pulmonary disease) (HCC)   Chronic systolic CHF (congestive heart failure) (HCC)   Closed left ankle fracture   Closed fracture of right distal fibula   PUD (peptic ulcer disease)   GAD (generalized anxiety disorder)   Essential hypertension, benign   Closed bimalleolar fracture of left ankle  Sepsis due to UTI:  - Continue ceftriaxone 2g q24h pending urine culture and blood cultures (pending, NGTD). No EMR history of MDRO on my review today. WBC responding (20 > 17). PCT is 0.97. Hypotension noted, improving. Cortisol 21.9.   Right distal fibula fracture:  - Orthopedics evaluated, recommends NWB RLE - Trial tramadol prn pain, may need to escalate though has allergies to hydrocodone and "morphine and related" on allergy list (N/V). This was discussed at length. The options as I see them are to either continue treating this pain inadequately or trial different opioids at low doses supported with antiemetics. They have opted for the latter, will start with oxycodone. Prefer oral agents.   Left bimalleolar ankle fracture:  - WBAT in walking boot, anticipate operative fixation per orthopedics, d/w Dr. Dallas Schimke, once medically optimized during this hospitalization.   Chronic combined HFrEF, HTN: Last echo Feb 2024 showed LVEF 35-40% (down from Jan 2023 at  45-50%), G1DD, global hypokinesis, mildly reduced RV systolic function, mild enlargement, normal estimated PA pressure, RVSP 25.  - Given lasix IV x2 overnight, SCr up. Stop home torsemide for today, monitor clinically. BNP down 991 > 117 with that.  - Hold entresto, spironolactone, torsemide with AKI.  - Anticipate cardiology formal evaluation given her heart failure and need for  surgery.  Normocytic anemia: Hemodilution effect in absence of overt bleeding.  - Continue monitoring.   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 SCr elevated and rising. SCr baseline 1.3-1.4, was 1.9 on admission up to 2.2 now. - Renal U/S ordered - RN reports multiple episodes of retention >400cc, will insert a foley given her current bedbound status and need for close I/O monitoring with recurrent retention. - Hold diuretic and IVF, allow po, monitor in AM while holding nephrotoxins (was given vancomycin at admission as well).  - Continue home sensipar  COPD:  - Continue formulary equivalent controller BD and prn albuterol  Hyponatremia:  - Monitor.  GAD:  - Continue SSRI - prn alprazolam, hydroxyzine  PUD: No abd pain. With renal impairment, this contraindicates NSAID use. - Continue PPI.   Hypothyroidism: Recent TSH 3.300.  - Continue synthroid.   HLD:  - Continue statin   Morbid obesity: Body mass index is 49.87 kg/m.   Tyrone Nine, MD [facility] [email or web address] 07/01/2022, 10:42 AM

## 2022-07-02 DIAGNOSIS — A419 Sepsis, unspecified organism: Secondary | ICD-10-CM | POA: Diagnosis not present

## 2022-07-02 DIAGNOSIS — N390 Urinary tract infection, site not specified: Secondary | ICD-10-CM | POA: Diagnosis not present

## 2022-07-02 LAB — BASIC METABOLIC PANEL
Anion gap: 12 (ref 5–15)
BUN: 29 mg/dL — ABNORMAL HIGH (ref 8–23)
CO2: 21 mmol/L — ABNORMAL LOW (ref 22–32)
Calcium: 7.9 mg/dL — ABNORMAL LOW (ref 8.9–10.3)
Chloride: 101 mmol/L (ref 98–111)
Creatinine, Ser: 1.46 mg/dL — ABNORMAL HIGH (ref 0.44–1.00)
GFR, Estimated: 36 mL/min — ABNORMAL LOW (ref >60)
Glucose, Bld: 110 mg/dL — ABNORMAL HIGH (ref 70–99)
Potassium: 4.1 mmol/L (ref 3.5–5.1)
Sodium: 134 mmol/L — ABNORMAL LOW (ref 135–145)

## 2022-07-02 LAB — URINE CULTURE: Culture: >=100000 — AB

## 2022-07-02 LAB — CULTURE, BLOOD (ROUTINE X 2)

## 2022-07-02 MED ORDER — METHOCARBAMOL 500 MG PO TABS
500.0000 mg | ORAL_TABLET | Freq: Four times a day (QID) | ORAL | Status: DC | PRN
  Administered 2022-07-02 – 2022-07-09 (×5): 500 mg via ORAL
  Filled 2022-07-02 (×5): qty 1

## 2022-07-02 MED ORDER — LOPERAMIDE HCL 2 MG PO CAPS
2.0000 mg | ORAL_CAPSULE | ORAL | Status: DC | PRN
  Administered 2022-07-02 – 2022-07-07 (×2): 2 mg via ORAL
  Filled 2022-07-02 (×2): qty 1

## 2022-07-02 MED ORDER — DICLOFENAC SODIUM 1 % EX GEL
2.0000 g | Freq: Four times a day (QID) | CUTANEOUS | Status: DC
  Administered 2022-07-02: 2 g via TOPICAL
  Filled 2022-07-02 (×2): qty 100

## 2022-07-02 MED ORDER — MUSCLE RUB 10-15 % EX CREA
TOPICAL_CREAM | CUTANEOUS | Status: DC | PRN
  Administered 2022-07-05: 1 via TOPICAL
  Filled 2022-07-02 (×2): qty 85

## 2022-07-02 NOTE — Progress Notes (Signed)
[facility] PROGRESS NOTE  INGE DOENGES (DOB: 1941/01/16) ZOX:[id number] PCP: Junie Spencer, FNP  Brief Narrative: Sheila [name] is an 82 y.o. female with a history of COPD, HFrEF, chronic pain, anxiety, PUD who presented to the ED on 06/30/2022 due to worsening diffuse weakness culminating in a fall at home and subsequent inability to bear weight. EMS reported tachycardia and hypotension. Due to reports of change in urination and dysuria, ceftriaxone was given IM by EMS en route along with IV fluids. In the ED, T 97.5  BP 77/49 - to 95/59 after fluid bolus. HR 66 RR 19. Patient appeared ill, had bruised ankles that were tender. Code sepsis initiated in the field as above. IN ED she received 1 L IVF then LR at 150/hr. She receved Rocephin in the field and Vancomycin in ED. Lab: glucose 124, WBC 20.6 with 82/8/9 BUN 26, Cr 1.91 (baseline 24/1.3) lactic acid 1.9 to 1.7 after resuscitation. BNP 991 (baseline 54) U/A - cloudy, LE lrg, many bacteria/hpf, >50 wbc/hpf. X-ray revealed bilateral ankle fractures. [facility] called to admit for continued management of urosepsis and chronic disease mgt.  Subjective: Having recurrence of chronic MSK pain/right neck muscle spasm usually abates with bengay at home. Having loose stools which is her normal, requests imodium which she takes regularly, denies abd pain.   Objective: BP (!) 141/55   Pulse 82   Temp 98.4 F (36.9 C) (Axillary)   Resp 19   Ht 4\' 11"  (1.499 m)   Wt 109.9 kg   SpO2 95%   BMI 48.94 kg/m   Gen: No distress Neck: Full ROM, Right paraspinal spasm without midline tenderness or meningismus. Pulm: Clear, nonlabored  CV: RRR, no MRG or pitting edema GI: Soft, NT, ND, +BS  Neuro: Alert and oriented. No new focal deficits. Ext: Warm, no deformities. Bilateral ankle boots on, distal feet NVI. Skin: No new rashes, lesions or ulcers on visualized skin   Assessment & Plan: Principal Problem:   Sepsis secondary to UTI [facility]) Active  Problems:   Prerenal azotemia   COPD (chronic obstructive pulmonary disease) (HCC)   Chronic systolic CHF (congestive heart failure) (HCC)   Closed left ankle fracture   Closed fracture of right distal fibula   PUD (peptic ulcer disease)   GAD (generalized anxiety disorder)   Essential hypertension, benign   Closed bimalleolar fracture of left ankle  Severe sepsis due to UTI: with organ dysfunction (AKI) - Continue ceftriaxone 2g q24h pending urine culture and blood cultures (E. coli, NG2D). WBC responding, afebrile x24 hours. Stabilizing and can safely be transferred to the floor.  Right distal fibula fracture:  - Orthopedics evaluated, recommends NWB RLE, no surgery.  - Continue pain control as ordered with oxycodone (tolerated despite multiple allergies).    Left bimalleolar ankle fracture:  - WBAT in walking boot, anticipate operative fixation per orthopedics, d/w Dr. Dallas Schimke, once medically optimized during this hospitalization.   Chronic combined HFrEF, HTN: Last echo Feb 2024 showed LVEF 35-40% (down from Jan 2023 at 45-50%), G1DD, global hypokinesis, mildly reduced RV systolic function, mild enlargement, normal estimated PA pressure, RVSP 25.  - Given lasix IV x2 overnight, SCr up. BNP down 991 > 117 with that.  - Will plan to restart home entresto, spironolactone, torsemide 5/10 if renal parameters stable. - She is at some risk for orthopedic surgery but medically optimized for surgery. Could ask for cardiology formal evaluation if requested by orthopedics.  Normocytic anemia: Hemodilution effect in absence of overt  bleeding.  - Continue monitoring.   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 SCr elevated and rising. SCr baseline 1.3-1.4, was 1.9 > 2.2 > 1.46.  - Renal U/S showed no hydro, though has had urinary retention. Continue foley catheter for now, plan TOV in next 24-48 hours.  - Continue holding nephrotoxins (was given vancomycin at  admission) - Continue home sensipar  COPD:  - Continue formulary equivalent controller BD and prn albuterol  Hyponatremia:  - Monitor.  GAD:  - Continue SSRI - prn alprazolam, hydroxyzine  PUD: No abd pain. With renal impairment, this contraindicates systemic NSAID use. - Continue PPI.   Hypothyroidism: Recent TSH 3.300.  - Continue synthroid.   HLD:  - Continue statin   Chronic neck pain:  - Robaxin prn - Topical camphor (takes this at home), could try voltaren topically if ineffective.   Loose stools: Chronic. No abd pain, benign exam.  - Imodium prn is ok.  Morbid obesity: Body mass index is 48.94 kg/m.   Nocturnal hypoxia:  - Sleep study recommended as outpatient  Tyrone Nine, MD [facility] [email or web address] 07/02/2022, 1:39 PM

## 2022-07-02 NOTE — Progress Notes (Signed)
Patient slept  well throughout the night after the analgesics and anxiolytics, when patient was in deep sleep she would desat to low 80s, and that sustained for a while, put on 2 litres of [place], did fine with it, took the  off after the patient is awake, satting fine, patient might need to be evaluated for sleep apnea. Endorsed to oncoming nurse

## 2022-07-03 ENCOUNTER — Encounter (HOSPITAL_COMMUNITY): Payer: Self-pay | Admitting: Internal Medicine

## 2022-07-03 DIAGNOSIS — S82831A Other fracture of upper and lower end of right fibula, initial encounter for closed fracture: Secondary | ICD-10-CM

## 2022-07-03 DIAGNOSIS — I5020 Unspecified systolic (congestive) heart failure: Secondary | ICD-10-CM

## 2022-07-03 DIAGNOSIS — I5022 Chronic systolic (congestive) heart failure: Secondary | ICD-10-CM | POA: Diagnosis not present

## 2022-07-03 DIAGNOSIS — I428 Other cardiomyopathies: Secondary | ICD-10-CM

## 2022-07-03 DIAGNOSIS — A419 Sepsis, unspecified organism: Secondary | ICD-10-CM | POA: Diagnosis not present

## 2022-07-03 DIAGNOSIS — Z01810 Encounter for preprocedural cardiovascular examination: Secondary | ICD-10-CM

## 2022-07-03 DIAGNOSIS — N390 Urinary tract infection, site not specified: Secondary | ICD-10-CM | POA: Diagnosis not present

## 2022-07-03 LAB — CBC
HCT: 30 % — ABNORMAL LOW (ref 36.0–46.0)
Hemoglobin: 9.7 g/dL — ABNORMAL LOW (ref 12.0–15.0)
MCH: 28.4 pg (ref 26.0–34.0)
MCHC: 32.3 g/dL (ref 30.0–36.0)
MCV: 88 fL (ref 80.0–100.0)
Platelets: 158 10*3/uL (ref 150–400)
RBC: 3.41 MIL/uL — ABNORMAL LOW (ref 3.87–5.11)
RDW: 14.4 % (ref 11.5–15.5)
WBC: 8.7 10*3/uL (ref 4.0–10.5)
nRBC: 0 % (ref 0.0–0.2)

## 2022-07-03 LAB — BASIC METABOLIC PANEL
Anion gap: 11 (ref 5–15)
BUN: 21 mg/dL (ref 8–23)
CO2: 21 mmol/L — ABNORMAL LOW (ref 22–32)
Calcium: 8.3 mg/dL — ABNORMAL LOW (ref 8.9–10.3)
Chloride: 104 mmol/L (ref 98–111)
Creatinine, Ser: 1.14 mg/dL — ABNORMAL HIGH (ref 0.44–1.00)
GFR, Estimated: 49 mL/min — ABNORMAL LOW (ref >60)
Glucose, Bld: 110 mg/dL — ABNORMAL HIGH (ref 70–99)
Potassium: 3.3 mmol/L — ABNORMAL LOW (ref 3.5–5.1)
Sodium: 136 mmol/L (ref 135–145)

## 2022-07-03 LAB — CULTURE, BLOOD (ROUTINE X 2): Special Requests: ADEQUATE

## 2022-07-03 MED ORDER — POTASSIUM CHLORIDE CRYS ER 20 MEQ PO TBCR
20.0000 meq | EXTENDED_RELEASE_TABLET | Freq: Once | ORAL | Status: AC
  Administered 2022-07-03: 20 meq via ORAL
  Filled 2022-07-03: qty 1

## 2022-07-03 MED ORDER — CEFAZOLIN SODIUM-DEXTROSE 1-4 GM/50ML-% IV SOLN
1.0000 g | Freq: Three times a day (TID) | INTRAVENOUS | Status: DC
  Administered 2022-07-03 – 2022-07-07 (×11): 1 g via INTRAVENOUS
  Filled 2022-07-03 (×18): qty 50

## 2022-07-03 MED ORDER — SPIRONOLACTONE 25 MG PO TABS
25.0000 mg | ORAL_TABLET | Freq: Every day | ORAL | Status: DC
  Administered 2022-07-03 – 2022-07-06 (×4): 25 mg via ORAL
  Filled 2022-07-03 (×4): qty 1

## 2022-07-03 MED ORDER — SACUBITRIL-VALSARTAN 97-103 MG PO TABS
1.0000 | ORAL_TABLET | Freq: Two times a day (BID) | ORAL | Status: DC
  Administered 2022-07-03 – 2022-07-06 (×8): 1 via ORAL
  Filled 2022-07-03 (×11): qty 1

## 2022-07-03 MED ORDER — HEPARIN SODIUM (PORCINE) 5000 UNIT/ML IJ SOLN
5000.0000 [IU] | Freq: Three times a day (TID) | INTRAMUSCULAR | Status: AC
  Administered 2022-07-03 – 2022-07-05 (×7): 5000 [IU] via SUBCUTANEOUS
  Filled 2022-07-03 (×7): qty 1

## 2022-07-03 MED ORDER — TORSEMIDE 20 MG PO TABS
20.0000 mg | ORAL_TABLET | Freq: Two times a day (BID) | ORAL | Status: DC
  Administered 2022-07-03 – 2022-07-09 (×13): 20 mg via ORAL
  Filled 2022-07-03 (×13): qty 1

## 2022-07-03 MED ORDER — CEFAZOLIN SODIUM-DEXTROSE 2-4 GM/100ML-% IV SOLN: 2.0000 g | Freq: Three times a day (TID) | INTRAVENOUS | Status: DC

## 2022-07-03 NOTE — Progress Notes (Signed)
[facility] PROGRESS NOTE  Sheila [name] (DOB: 1941-01-03) ZOX:[id number] PCP: Junie Spencer, FNP  Brief Narrative: Sheila [name] is an 82 y.o. female with a history of COPD, HFrEF, chronic pain, anxiety, PUD who presented to the ED on 06/30/2022 due to worsening diffuse weakness culminating in a fall at home and subsequent inability to bear weight. EMS reported tachycardia and hypotension. Due to reports of change in urination and dysuria, ceftriaxone was given IM by EMS en route along with IV fluids. In the ED, T 97.5  BP 77/49 - to 95/59 after fluid bolus. HR 66 RR 19. Patient appeared ill, had bruised ankles that were tender. Code sepsis initiated in the field as above. IN ED she received 1 L IVF then LR at 150/hr. She receved Rocephin in the field and Vancomycin in ED. Lab: glucose 124, WBC 20.6 with 82/8/9 BUN 26, Cr 1.91 (baseline 24/1.3) lactic acid 1.9 to 1.7 after resuscitation. BNP 991 (baseline 54) U/A - cloudy, LE lrg, many bacteria/hpf, >50 wbc/hpf. X-ray revealed bilateral ankle fractures. [facility] called to admit for continued management of urosepsis and chronic disease mgt.  Subjective: No new issues. Pain is controlled, nervous about surgery but understands need. No current or recent chest pain or exertional dyspnea. Denies orthopnea.   Objective: BP (!) 140/51 (BP Location: Left Arm)   Pulse 72   Temp 97.7 F (36.5 C)   Resp 17   Ht 4\' 11"  (1.499 m)   Wt 109.9 kg   SpO2 92%   BMI 48.94 kg/m   Gen: No distress Pulm: Clear, nonlabored  CV: RRR, no MRG or pitting dependent edema GI: Soft, NT, ND, +BS  Neuro: Alert and oriented. No new focal deficits. Ext: Warm, no deformities. Ankles with boots in place, feet NVI Skin: No other rashes, lesions or ulcers on visualized skin   Assessment & Plan: Principal Problem:   Sepsis secondary to UTI [facility]) Active Problems:   Prerenal azotemia   COPD (chronic obstructive pulmonary disease) (HCC)   Chronic systolic CHF (congestive  heart failure) (HCC)   Closed left ankle fracture   Closed fracture of right distal fibula   PUD (peptic ulcer disease)   GAD (generalized anxiety disorder)   Essential hypertension, benign   Closed bimalleolar fracture of left ankle  Severe sepsis due to UTI: with organ dysfunction (AKI) - Based on urine culture susceptibilities, will narrow ceftriaxone to ancef. Leukocytosis resolved, remaining afebrile, blood cultures remain NGTD.   Right distal fibula fracture:  - Orthopedics evaluated, recommends NWB RLE, no surgery.  - Continue pain control as ordered with oxycodone (tolerated despite multiple allergies).    Left bimalleolar ankle fracture:  - WBAT in walking boot, anticipate operative fixation per orthopedics on 5/13. Holding heparin night before, made NPO p MN.   Chronic combined HFrEF, HTN: Last echo Feb 2024 showed LVEF 35-40% (down from Jan 2023 at 45-50%), G1DD, global hypokinesis, mildly reduced RV systolic function, mild enlargement, normal estimated PA pressure, RVSP 25.   - Will restart home entresto, spironolactone, torsemide 5/10   - She is at intermediate-to-high, but not prohibitive, perioperative risk per cardiology who has made recommendations for the perioperative period. Appreciate their preoperative evaluation. No further testing recommended. Will remain on telemetry monitoring.  Normocytic anemia: Likely due to fractures and exaggerated by hemodilution. No gross bleeding currently. - Continue monitoring with transfusion threshold of 7-8 g/dl depending on symptoms.   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 SCr elevated and rising. SCr baseline 1.3-1.4, was 1.9 > 2.2 > 1.46 > 1.14.  - Renal U/S showed no hydro, though has had urinary retention. Continue foley catheter for now, plan TOV over the weekend.  - Continue holding nephrotoxins (was given vancomycin at admission) - Continue home sensipar  COPD:  - Continue formulary  equivalent controller BD and prn albuterol  Hyponatremia: Improved.  GAD:  - Continue SSRI - prn alprazolam, hydroxyzine  PUD: No abd pain. With renal impairment, this contraindicates systemic NSAID use. - Continue PPI.   Hypothyroidism: Recent TSH 3.300.  - Continue synthroid.   HLD:  - Continue statin   Chronic neck pain:  - Robaxin prn - Topical camphor (takes this at home), could try voltaren topically if ineffective.   Loose stools: Chronic. No abd pain, benign exam.  - Imodium prn is ok.  Morbid obesity: Body mass index is 48.94 kg/m.   Nocturnal hypoxia:  - Sleep study recommended as outpatient  Tyrone Nine, MD [facility] [email or web address] 07/03/2022, 11:07 AM

## 2022-07-03 NOTE — Progress Notes (Addendum)
Pharmacy Antibiotic Note  Sheila [name] is a 82 y.o. female admitted on 06/30/2022 with UTI.  Pharmacy has been consulted for cefazolin dosing.  Plan: Cefazolin 1000 mg IV every 8 hours. Monitor labs, c/s, and patient improvement.  Height: 4\' 11"  (149.9 cm) Weight: 109.9 kg (242 lb 4.6 oz) IBW/kg (Calculated) : 43.2  Temp (24hrs), Avg:98.6 F (37 C), Min:97.7 F (36.5 C), Max:99.3 F (37.4 C)  Recent Labs  Lab 06/30/22 1131 06/30/22 1341 07/01/22 0435 07/02/22 0750 07/03/22 0510  WBC 20.6*  --  17.0*  --  8.7  CREATININE 1.91*  --  2.24* 1.46* 1.14*  LATICACIDVEN 1.9 1.7  --   --   --     Estimated Creatinine Clearance: 43.4 mL/min (A) (by C-G formula based on SCr of 1.14 mg/dL (H)).    Allergies  Allergen Reactions   Ibuprofen Other (See Comments)    History of bleeding ulcers - contra-indicated    Benadryl [Diphenhydramine Hcl] Other (See Comments)    Worsening depression    Lipitor [Atorvastatin] Other (See Comments)    Body aches.   Baclofen Other (See Comments)    Loopy    Hydrocodone Nausea And Vomiting   Codeine Nausea And Vomiting   Morphine And Related Nausea And Vomiting   Tdap [Tetanus-Diphth-Acell Pertussis] Swelling    Redness and localized swelling at injection site     Antimicrobials this admission: Cefazolin 5/10 >> CTX 5/7 >> 5/9   Microbiology results: 5/7 BCx: ngtd 5/7 UCx: >100k pansensitive e. coli    Thank you for allowing pharmacy to be a part of this patient's care.  Judeth Cornfield, PharmD Clinical Pharmacist 07/03/2022 7:39 AM

## 2022-07-03 NOTE — Care Management Important Message (Signed)
Important Message  Patient Details  Name: Sheila [name] MRN: [id number] Date of Birth: Apr 25, 1940   Medicare Important Message Given:  Yes     Corey Harold 07/03/2022, 10:17 AM

## 2022-07-03 NOTE — Progress Notes (Signed)
   ORTHOPAEDIC PROGRESS NOTE  Nondisplaced right distal fibula fracture Displaced left distal fibula fracture  DOS: Will plan for surgery 07/06/2022  SUBJECTIVE: No issues overnight.  She continues to have pain in both ankles.  She has been transferred from the ICU, to the floor.  Infection is improving.  OBJECTIVE: PE:  Alert and oriented.  No acute distress.  Bilateral ankles with boots in place. Diffuse swelling over the lateral right ankle.  Diffuse swelling and bruising over the medial and lateral left ankle.  Sensation intact over the dorsum of her feet.  2+ DP pulses.  Vitals:   07/03/22 0340 07/03/22 0927  BP: (!) 140/51   Pulse: 72   Resp: 17   Temp: 97.7 F (36.5 C)   SpO2: 94% 92%     ASSESSMENT: Sheila [name] is a 82 y.o. female who is stable, improving overall in relation to her sepsis.  PLAN: Weightbearing: WBAT RLE in a walking boot; nonweightbearing left lower extremity in a walking boot. Incision and dressing care: No dressing currently Orthopedic device(s): CAM boot VTE prophylaxis:  As needed.  At the discretion of the medicine team.  Please hold for surgery on 07/06/2022   Pain control: As needed Follow - up plan: I will see her in clinic approximately 2 weeks following surgery.   Will plan to proceed with surgery for operative fixation of the left distal fibula fracture.  The procedure was discussed in great detail.  All questions were answered.  She is to remain nonweightbearing in the left ankle.  Okay to be weightbearing in the walking boot on the right ankle.  Please hold anticoagulation the night prior to surgery.  N.p.o. at midnight, in preparation for surgery.   Contact information:     Shameeka Silliman A. Dallas Schimke, MD MS [facility] [address] [place],  [place]  [address] Phone: [phone] Fax: [phone]

## 2022-07-03 NOTE — Consult Note (Signed)
Cardiology Consultation:   Patient ID: Sheila [name]; [id number]; 1940-08-23   Admit date: 06/30/2022 Date of Consult: 07/03/2022  Primary Care Provider: Junie Spencer, FNP Primary Cardiologist: Sheila Nose, MD  History of Present Illness:   [name] is an 82 y.o. female currently admitted to the hospital due to weakness and a mechanical fall, ultimately diagnosed with urosepsis and acute renal insufficiency as well as fractures including the right distal fibula and left bimalleolar regions.  She is being managed on the hospital service with overall clinical improvement and has been seen by orthopedics with plan for ankle fracture surgery under general anesthesia next week.  Cardiology consulted to provide perioperative risk assessment.  Cardiac history includes nonischemic cardiomyopathy, documentation of branch vessel disease with no major epicardial stenosis at cardiac catheterization in 2021.  Most recent LVEF 35 to 40% by echocardiogram in February of this year, global hypokinesis and mild diastolic dysfunction at that time.  Also mildly reduced RV contraction with normal estimated RVSP and no major valvular abnormalities.  She is functionally limited, has an Geophysicist/field seismologist for most hours of the day during the week, her daughter also his caregiver.  Typical activities less than 4 METS.  He denies any palpitations or syncope.  Has intermittent leg edema.  She had been in a hospice situation with palliative care but one point per chart review.  She reports compliance with her cardiac medications.  These were held initially during hospital stay in the setting of acute renal insufficiency, however resumed at this point.  ROS:  Pertinent review in history of present illness.  No orthopnea or PND.  No syncope.  Past Medical History:  Diagnosis Date   Allergy    Bell's palsy    CAD (coronary artery disease)    Branch vessel disease 2021 managed medically   Cataract    Depression     Diverticulosis of colon    DJD (degenerative joint disease) of knee    Family history of adverse reaction to anesthesia    sister ha nausea/vomiting   Gastric ulcer with hemorrhage 01/14/2011   GERD (gastroesophageal reflux disease)    Headache    HFrEF (heart failure with reduced ejection fraction) (HCC)    History of fractured pelvis    Hypercholesteremia    Hypertension    Hypothyroidism    Nonischemic cardiomyopathy (HCC)    Skin cancer    UTI (lower urinary tract infection)     Past Surgical History:  Procedure Laterality Date   APPENDECTOMY     BREAST BIOPSY Left    ESOPHAGOGASTRODUODENOSCOPY  01/14/2011   Procedure: ESOPHAGOGASTRODUODENOSCOPY (EGD);  Surgeon: Sheila Hippo, MD;  Location: [facility];  Service: Endoscopy;  Laterality: N/A;   HIP SURGERY     pelvis   JOINT REPLACEMENT     KNEE SURGERY  04/2010.   Total left knee replacement   LAPAROSCOPIC APPENDECTOMY N/A 08/01/2013   Procedure: APPENDECTOMY LAPAROSCOPIC;  Surgeon: Sheila Heading, MD;  Location: [facility];  Service: General;  Laterality: N/A;   RIGHT/LEFT HEART CATH AND CORONARY ANGIOGRAPHY N/A 10/02/2019   Procedure: RIGHT/LEFT HEART CATH AND CORONARY ANGIOGRAPHY;  Surgeon: Sheila Gess, MD;  Location: [facility];  Service: Cardiovascular;  Laterality: N/A;     Inpatient Medications: Scheduled Meds:  Chlorhexidine Gluconate Cloth  6 each Topical Q0600   cholecalciferol  1,000 Units Oral Daily   cinacalcet  30 mg Oral QODAY   gabapentin  300 mg Oral TID  heparin  5,000 Units Subcutaneous Q8H   levothyroxine  100 mcg Oral Q0600   loratadine  10 mg Oral Daily   metoprolol tartrate  12.5 mg Oral BID   mometasone-formoterol  2 puff Inhalation BID   pantoprazole  40 mg Oral BID   pravastatin  80 mg Oral q1800   sacubitril-valsartan  1 tablet Oral BID   sertraline  100 mg Oral Daily   spironolactone  25 mg Oral Daily   torsemide  20 mg Oral BID   umeclidinium bromide  1 puff Inhalation  Daily   Continuous Infusions:   ceFAZolin (ANCEF) IV     PRN Meds: albuterol, ALPRAZolam, fentaNYL (SUBLIMAZE) injection, hydrOXYzine, loperamide, methocarbamol, Muscle Rub, ondansetron, oxyCODONE  Allergies:    Allergies  Allergen Reactions   Ibuprofen Other (See Comments)    History of bleeding ulcers - contra-indicated    Benadryl [Diphenhydramine Hcl] Other (See Comments)    Worsening depression    Lipitor [Atorvastatin] Other (See Comments)    Body aches.   Baclofen Other (See Comments)    Loopy    Hydrocodone Nausea And Vomiting   Codeine Nausea And Vomiting   Morphine And Related Nausea And Vomiting   Tdap [Tetanus-Diphth-Acell Pertussis] Swelling    Redness and localized swelling at injection site     Social History:   Social History   Tobacco Use   Smoking status: Former    Types: Cigarettes    Quit date: 02/24/1968    Years since quitting: 54.3   Smokeless tobacco: Current    Types: Snuff   Tobacco comments:    quit yrs and yrs ago when she was very young  Substance Use Topics   Alcohol use: No    Family History:   The patient's family history includes Alzheimer's disease in her sister; Aneurysm in her mother; Cancer in her brother, brother, father, and sister; Heart disease in her sister; Hip fracture in her sister; Hypertension in her mother; Stroke in her mother.  Physical Exam/Data:   Vitals:   07/02/22 2029 07/03/22 0009 07/03/22 0340 07/03/22 0927  BP: 124/82 (!) 146/80 (!) 140/51   Pulse: 84 75 72   Resp: 18 20 17    Temp: 98.9 F (37.2 C) 98.7 F (37.1 C) 97.7 F (36.5 C)   TempSrc: Oral Oral    SpO2: 95% 97% 94% 92%  Weight:      Height:        Intake/Output Summary (Last 24 hours) at 07/03/2022 0941 Last data filed at 07/03/2022 0900 Gross per 24 hour  Intake 480 ml  Output 1300 ml  Net -820 ml   Filed Weights   06/30/22 2057 06/30/22 2100 07/02/22 0600  Weight: 112 kg 112 kg 109.9 kg   Body mass index is 48.94 kg/m.   Gen:  Obese woman, appears comfortable at rest. HEENT: Conjunctiva and lids normal. Neck: Supple, no elevated JVP. Lungs: Clear to auscultation, nonlabored breathing at rest. Cardiac: Indistinct PMI, RRR without gallop or significant murmur. Abdomen: Soft, bowel sounds present. Extremities: Boot stabilizers on both lower legs. Skin: Warm and dry. Musculoskeletal: No kyphosis. Neuropsychiatric: Alert and oriented x3, affect grossly appropriate.  EKG:  An ECG dated 06/30/2022 was personally reviewed today and demonstrated:  Sinus rhythm with low voltage, nonspecific ST changes.  Telemetry:  I personally reviewed telemetry which shows sinus rhythm with rare PVCs.  Relevant CV Studies:  Echocardiogram 04/14/2022:  1. Left ventricular ejection fraction, by estimation, is 35 to 40%. The  left  ventricle has moderately decreased function. The left ventricle  demonstrates global hypokinesis. Left ventricular diastolic parameters are  consistent with Grade I diastolic  dysfunction (impaired relaxation).   2. Right ventricular systolic function is mildly reduced. The right  ventricular size is mildly enlarged. There is normal pulmonary artery  systolic pressure. The estimated right ventricular systolic pressure is  25.1 mmHg.   3. The mitral valve is normal in structure. No evidence of mitral valve  regurgitation. No evidence of mitral stenosis.   4. The aortic valve is tricuspid. There is mild calcification of the  aortic valve. Aortic valve regurgitation is not visualized. No aortic  stenosis is present.   5. The inferior vena cava is normal in size with greater than 50%  respiratory variability, suggesting right atrial pressure of 3 mmHg.   Laboratory Data:  Chemistry Recent Labs  Lab Jul 16, 2022 0435 07/02/22 0750 07/03/22 0510  NA 132* 134* 136  K 3.6 4.1 3.3*  CL 99 101 104  CO2 21* 21* 21*  GLUCOSE 130* 110* 110*  BUN 35* 29* 21  CREATININE 2.24* 1.46* 1.14*  CALCIUM 8.0* 7.9* 8.3*   GFRNONAA 22* 36* 49*  ANIONGAP 12 12 11     Recent Labs  Lab 06/30/22 1131  PROT 7.5  ALBUMIN 3.7  AST 30  ALT 25  ALKPHOS 38  BILITOT 2.0*   Hematology Recent Labs  Lab 06/30/22 1131 16-Jul-2022 0435 07/03/22 0510  WBC 20.6* 17.0* 8.7  RBC 4.21 3.69* 3.41*  HGB 12.0 10.8* 9.7*  HCT 36.4 31.9* 30.0*  MCV 86.5 86.4 88.0  MCH 28.5 29.3 28.4  MCHC 33.0 33.9 32.3  RDW 14.6 14.5 14.4  PLT 167 174 158   BNP Recent Labs  Lab 06/30/22 1130 Jul 16, 2022 0435  BNP 991.0* 117.0*    Lipid Panel     Component Value Date/Time   CHOL 192 05/05/2022 1430   CHOL 154 07/28/2012 1248   TRIG 301 (H) 05/05/2022 1430   TRIG 142 05/11/2014 1559   TRIG 120 07/28/2012 1248   HDL 47 05/05/2022 1430   HDL 45 05/11/2014 1559   HDL 44 07/28/2012 1248   CHOLHDL 4.1 05/05/2022 1430   LDLCALC 95 05/05/2022 1430   LDLCALC 48 11/15/2012 1255   LDLCALC 86 07/28/2012 1248   LABVLDL 50 (H) 05/05/2022 1430    Radiology/Studies:  US RENAL  Result Date: 07-16-22 CLINICAL DATA:  Acute kidney injury EXAM: RENAL / URINARY TRACT ULTRASOUND COMPLETE COMPARISON:  CT 03/06/2021 FINDINGS: Right Kidney: Renal measurements: 10.3 x 4.0 x 5.3 cm = volume: 114.47 mL. Echogenicity within normal limits. No mass or hydronephrosis visualized. Left Kidney: Renal measurements: 11.0 x 4.8 x 4.2 cm = volume: 114.59 mL. Echogenicity within normal limits. No mass or hydronephrosis visualized. Bladder: Appears normal for degree of bladder distention. Ureteral jets are seen. Other: Note is made of a gallstone. IMPRESSION: No collecting system dilatation. Incidental note made of a gallstone Electronically Signed   By: Karen Kays M.D.   On: 2022/07/16 11:51   DG Ankle Complete Right  Result Date: 06/30/2022 CLINICAL DATA:  Bilateral ankle pain and swelling after fall EXAM: RIGHT ANKLE - COMPLETE 3+ VIEW; LEFT ANKLE COMPLETE - 3+ VIEW COMPARISON:  Left ankle radiographs 12/29/2014 FINDINGS: Left: Acute mildly displaced oblique  fracture through the distal left fibular metadiaphysis. The fracture line extends into the tibiofibular syndesmosis above the ankle mortise. Mild lateral subluxation of the talus in relation to the tibia with widening of the medial ankle mortise.  Marked soft tissue swelling about the left foot. Right: Nondisplaced transverse fracture through the distal right fibular metaphysis. The fracture line extends into the tibiofibular syndesmosis at the level of the ankle mortise. Marked soft tissue swelling about the lateral malleolus. IMPRESSION: 1. Left: Oblique displaced fracture through the distal left fibular metadiaphysis. Lateral subluxation of the left talus in relation to the tibia. 2. Right: Acute nondisplaced transverse fracture through the distal right fibular metaphysis. Electronically Signed   By: Minerva Fester M.D.   On: 06/30/2022 18:13   DG Ankle Complete Left  Result Date: 06/30/2022 CLINICAL DATA:  Bilateral ankle pain and swelling after fall EXAM: RIGHT ANKLE - COMPLETE 3+ VIEW; LEFT ANKLE COMPLETE - 3+ VIEW COMPARISON:  Left ankle radiographs 12/29/2014 FINDINGS: Left: Acute mildly displaced oblique fracture through the distal left fibular metadiaphysis. The fracture line extends into the tibiofibular syndesmosis above the ankle mortise. Mild lateral subluxation of the talus in relation to the tibia with widening of the medial ankle mortise. Marked soft tissue swelling about the left foot. Right: Nondisplaced transverse fracture through the distal right fibular metaphysis. The fracture line extends into the tibiofibular syndesmosis at the level of the ankle mortise. Marked soft tissue swelling about the lateral malleolus. IMPRESSION: 1. Left: Oblique displaced fracture through the distal left fibular metadiaphysis. Lateral subluxation of the left talus in relation to the tibia. 2. Right: Acute nondisplaced transverse fracture through the distal right fibular metaphysis. Electronically Signed   By:  Minerva Fester M.D.   On: 06/30/2022 18:13   DG Chest Port 1 View  Result Date: 06/30/2022 CLINICAL DATA:  Possible sepsis. EXAM: PORTABLE CHEST 1 VIEW COMPARISON:  Chest x-ray dated Jun 26, 2021. FINDINGS: Stable cardiomediastinal silhouette. Normal pulmonary vascularity. No focal consolidation, pleural effusion, or pneumothorax. No acute osseous abnormality. IMPRESSION: No active disease. Electronically Signed   By: Obie Dredge M.D.   On: 06/30/2022 11:05    Assessment and Plan:   1.  Perioperative cardiac risk assessment in an 82 year old woman with history of nonischemic cardiomyopathy, LVEF 35 to 40% by echocardiogram in February and mild RV dysfunction with normal estimated RVSP.  She has branch vessel CAD managed medically as of 2021 with no major epicardial stenosis, hypertension, hyperlipidemia, and recent urosepsis with acute renal insufficiency.  Creatinine is improved from 2.24 down to 1.14 and baseline cardiac regimen resumed.  RCRI perioperative cardiac risk index is intermediate to high, class III-IV with 6% to 11% chance of major adverse cardiac event.  This is not a modifiable risk however and based on her substrate and comorbidities.  She is relatively stable from a cardiac perspective otherwise and no further cardiac testing is planned at this time.  2.  HFrEF with nonischemic cardiomyopathy, clinically stable at this time with low-level activity, generally NYHA class II dyspnea, no palpitations or syncope.  LVEF 35 to 40% by echocardiogram in February.  She is currently on Lopressor, Entresto, Aldactone, and Demadex.  Not optimal candidate for SGLT2 inhibitor given UTI risk and in fact recent urosepsis.  3.  Branch vessel CAD, specifically diagonal lesion managed medically in 2021.  No active angina.  She is on Pravachol.  No further inpatient cardiac testing planned at this time.  Intermediate to high perioperative cardiac risk is not modifiable as discussed above and does not  preclude proceeding with orthopedic surgery under general anesthesia.  She is at risk for heart failure, watch fluid status perioperatively as diuretics may need to be adjusted.  Would also  keep her on telemetry to monitor rhythm.  At the present time renal function is stable on current regimen and no changes were made.  For questions or updates, please contact [facility] Please consult [email or web address] for contact info under   Signed, Nona Dell, MD  07/03/2022 9:41 AM

## 2022-07-04 DIAGNOSIS — N390 Urinary tract infection, site not specified: Secondary | ICD-10-CM | POA: Diagnosis not present

## 2022-07-04 DIAGNOSIS — S82831A Other fracture of upper and lower end of right fibula, initial encounter for closed fracture: Secondary | ICD-10-CM | POA: Diagnosis not present

## 2022-07-04 DIAGNOSIS — I5022 Chronic systolic (congestive) heart failure: Secondary | ICD-10-CM | POA: Diagnosis not present

## 2022-07-04 DIAGNOSIS — A419 Sepsis, unspecified organism: Secondary | ICD-10-CM | POA: Diagnosis not present

## 2022-07-04 LAB — CULTURE, BLOOD (ROUTINE X 2): Culture: NO GROWTH

## 2022-07-04 LAB — BASIC METABOLIC PANEL
Anion gap: 10 (ref 5–15)
BUN: 24 mg/dL — ABNORMAL HIGH (ref 8–23)
CO2: 23 mmol/L (ref 22–32)
Calcium: 8.1 mg/dL — ABNORMAL LOW (ref 8.9–10.3)
Chloride: 103 mmol/L (ref 98–111)
Creatinine, Ser: 1.27 mg/dL — ABNORMAL HIGH (ref 0.44–1.00)
GFR, Estimated: 43 mL/min — ABNORMAL LOW (ref >60)
Glucose, Bld: 121 mg/dL — ABNORMAL HIGH (ref 70–99)
Potassium: 3.5 mmol/L (ref 3.5–5.1)
Sodium: 136 mmol/L (ref 135–145)

## 2022-07-04 LAB — GLUCOSE, CAPILLARY: Glucose-Capillary: 127 mg/dL — ABNORMAL HIGH (ref 70–99)

## 2022-07-04 MED ORDER — GUAIFENESIN ER 600 MG PO TB12
600.0000 mg | ORAL_TABLET | Freq: Two times a day (BID) | ORAL | Status: DC
  Administered 2022-07-04 – 2022-07-09 (×10): 600 mg via ORAL
  Filled 2022-07-04 (×10): qty 1

## 2022-07-04 MED ORDER — TRIAMCINOLONE ACETONIDE 0.1 % EX CREA
TOPICAL_CREAM | Freq: Two times a day (BID) | CUTANEOUS | Status: DC
  Administered 2022-07-06 – 2022-07-07 (×2): 1 via TOPICAL
  Filled 2022-07-04: qty 15

## 2022-07-04 MED ORDER — TRIAMCINOLONE 0.1 % CREAM:EUCERIN CREAM 1:1
TOPICAL_CREAM | Freq: Two times a day (BID) | CUTANEOUS | Status: DC
  Filled 2022-07-04: qty 1

## 2022-07-04 MED ORDER — DERMACERIN EX CREA
1.0000 | TOPICAL_CREAM | Freq: Two times a day (BID) | CUTANEOUS | Status: DC
  Administered 2022-07-04 – 2022-07-09 (×8): 1 via CUTANEOUS
  Filled 2022-07-04: qty 107

## 2022-07-04 NOTE — Progress Notes (Signed)
[facility] PROGRESS NOTE  Sheila [name] (DOB: [birth year]) ZOX:[id number] PCP: Sheila [name], Sheila [name]  Brief Narrative: Sheila [name] is an 82 y.o. female with a history of COPD, HFrEF, chronic pain, anxiety, PUD who presented to the ED on 06/30/2022 due to worsening diffuse weakness culminating in a fall at home and subsequent inability to bear weight. EMS reported tachycardia and hypotension. Due to reports of change in urination and dysuria, ceftriaxone was given IM by EMS en route along with IV fluids. In the ED, T 97.5  BP 77/49 - to 95/59 after fluid bolus. HR 66 RR 19. Patient appeared ill, had bruised ankles that were tender. Code sepsis initiated in the field as above. IN ED she received 1 L IVF then LR at 150/hr. She receved Rocephin in the field and Vancomycin in ED. Lab: glucose 124, WBC 20.6 with 82/8/9 BUN 26, Cr 1.91 (baseline 24/1.3) lactic acid 1.9 to 1.7 after resuscitation. BNP 991 (baseline 54) U/A - cloudy, LE lrg, many bacteria/hpf, >50 wbc/hpf. X-ray revealed bilateral ankle fractures. [facility] called to admit for continued management of urosepsis and chronic disease mgt.  Foley catheter was inserted due to urinary retention, TOV planned 5/12. With antibiotics, sepsis physiology has resolved, leukocytosis resolved, and antibiotics have been narrowed to ancef per culture data. Cardiology was consulted, recommending no further testing prior to operative fixation of left ankle fracture which is planned 5/13. PT evaluations are ongoing.   Subjective: Having dry skin/itching across back and hips which she has at home at homes, applies a cream prescribed by her doctor that she requests. Having BMs per her usual. No fever, chills. Pain is controlled, none in right neck/shoulder currently.   Objective: BP (!) 153/68 (BP Location: Left Arm)   Pulse 76   Temp 98.5 F (36.9 C) (Oral)   Resp 17   Ht 4\' 11"  (1.499 m)   Wt 109.9 kg   SpO2 90%   BMI 48.94 kg/m   Gen: No distress Pulm:  Clear, nonlabored  CV: RRR, no MRG GI: Soft, NT, ND, +BS Neuro: Alert and oriented. No new focal deficits. Ext: Warm, no deformities. BL boots in place, feet with intact cap refill, sensation and motor function.  Skin: Diffuse dry skin without rashes, lesions or ulcers on visualized skin   Assessment & Plan: Principal Problem:   Sepsis secondary to UTI [facility]) Active Problems:   Prerenal azotemia   COPD (chronic obstructive pulmonary disease) (HCC)   Chronic systolic CHF (congestive heart failure) (HCC)   Closed left ankle fracture   Closed fracture of right distal fibula   PUD (peptic ulcer disease)   GAD (generalized anxiety disorder)   Essential hypertension, benign   Closed bimalleolar fracture of left ankle  Severe sepsis due to UTI: with organ dysfunction (AKI) - Based on urine culture susceptibilities, continue ancef. Leukocytosis resolved, remaining afebrile, blood cultures remain NGTD.   Right distal fibula fracture:  - Orthopedics evaluated, recommends NWB RLE, no surgery.  - Continue pain control as ordered with oxycodone (tolerated despite multiple allergies).    Left bimalleolar ankle fracture:  - WBAT in walking boot, anticipate operative fixation per orthopedics on 5/13. Holding heparin night before, made NPO p MN.  - Get PT evaluations, change to WBAT/NWB in LE's per orthopedics, DC bedrest order.  Chronic combined HFrEF, HTN: Last echo Feb 2024 showed LVEF 35-40% (down from Jan 2023 at 45-50%), G1DD, global hypokinesis, mildly reduced RV systolic function, mild enlargement, normal estimated PA pressure, RVSP  25.   - Restarted home entresto, spironolactone, torsemide. Appears stable, will monitor BMP an additional day prior to voiding trial - She is at intermediate-to-high, but not prohibitive, perioperative risk per cardiology who has made recommendations for the perioperative period. Appreciate their preoperative evaluation. No further testing recommended. Will  remain on telemetry monitoring.  Normocytic anemia: Likely due to fractures and exaggerated by hemodilution. No gross bleeding currently. - Continue monitoring with transfusion threshold of 7-8 g/dl depending on symptoms.   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 SCr elevated and rising. SCr baseline 1.3-1.4, was 1.9 > 2.2 > 1.46 > 1.14. Up to 1.27 with reinitiation of cardiac medications.  - Renal U/S showed no hydro, though has had urinary retention. Continue foley catheter for now, plan TOV tmrw if renal function stable. - Continue holding nephrotoxins. - Continue home sensipar  COPD:  - Continue formulary equivalent controller BD and prn albuterol  Hyponatremia: Improved.  GAD:  - Continue SSRI - prn alprazolam, hydroxyzine  PUD: No abd pain. With renal impairment, this contraindicates systemic NSAID use. - Continue PPI.   Hypothyroidism: Recent TSH 3.300.  - Continue synthroid.   HLD:  - Continue statin   Chronic neck pain:  - Robaxin prn - Topical camphor (takes this at home), could try voltaren topically if ineffective.   Dry skin:  - Topical Tx ordered. May also benefit from mobilizing OOB.  Loose stools: Chronic. No abd pain, remains with benign exam.  - Imodium prn is ok.  Morbid obesity: Body mass index is 48.94 kg/m.   Nocturnal hypoxia:  - Sleep study recommended as outpatient  Tyrone Nine, MD [facility] [email or web address] 07/04/2022, 9:54 AM

## 2022-07-04 NOTE — Plan of Care (Signed)
  Problem: Acute Rehab PT Goals(only PT should resolve) Goal: Pt Will Go Supine/Side To Sit Outcome: Progressing Flowsheets (Taken 07/04/2022 0952) Pt will go Supine/Side to Sit: with minimal assist Goal: Patient Will Transfer Sit To/From Stand Outcome: Progressing Flowsheets (Taken 07/04/2022 [id number]) Patient will transfer sit to/from stand: with moderate assist Goal: Pt Will Transfer Bed To Chair/Chair To Bed Outcome: Progressing Flowsheets (Taken 07/04/2022 0952) Pt will Transfer Bed to Chair/Chair to Bed: with mod assist Goal: Pt Will Ambulate Outcome: Progressing Flowsheets (Taken 07/04/2022 0952) Pt will Ambulate:  10 feet  with moderate assist  with rolling walker

## 2022-07-04 NOTE — Evaluation (Signed)
Physical Therapy Evaluation Patient Details Name: Sheila [name] MRN: [id number] DOB: 1940/07/17 Today's Date: 07/04/2022  History of Present Illness  Mrs. Klaphake, an 82 y/o woman with HFrEF, chronic joint pain, anxiety, PUD, COPD by report has been ill for several days. Today she felt worse and weaker. She did suffer a fall landing on her ankles with subsequent pain. Due to her illness and ankle pain EMS activated: EMS called a CODE SEPSIS at 0941 en route to the hospital. EMS reports HR 127, EtCO2 32 (originally 19 upon their arrival), BP 68/38 (after NS bolus), temp 99.4. EMS reports foul odor to urine and frequent urination. Rocephin 1gm IM given by EMS at 0941. She was transported to [facility] for further evaluation.    Clinical Impression  Patient lying in bed on therapist arrival.  Agreeable to therapist assessment.  She is wearing boots on both ankles and verbalizes understanding of weight bearing status for both extremities; NWB on the left and WBAT on the right.  Patient needs moderate assist for her legs and to bring her trunk upright for supine to sit with the head of bed slightly elevated.  She takes extra effort and is fatigued and short of breath after coming up to sitting. Once sitting, patient needs both her arms for support for balance and states she has chronic back pain that limits her ability to sit with her back unsupported.   Patient needs moderate assist to scoot fully to the edge of the bed.  Patient with PT max assist tried multiple times to come to standing with RW while maintaining NWB status on her left lower extremity but patient is unable to clear buttocks from the bed.  Patient needs max A to return to supine from sitting and max A to scoot back up in bed.  Patient with max fatigue after session today. Per chart review, patient is scheduled for surgery left ankle on Monday 5/13.   Patient will benefit from continued skilled therapy services during the remainder of her hospital  stay and at the next recommended venue of care to address deficits and promote return to optimal function.         Recommendations for follow up therapy are one component of a multi-disciplinary discharge planning process, led by the attending physician.  Recommendations may be updated based on patient status, additional functional criteria and insurance authorization.  Follow Up Recommendations Can patient physically be transported by private vehicle: No     Assistance Recommended at Discharge Frequent or constant Supervision/Assistance  Patient can return home with the following  A lot of help with walking and/or transfers;A lot of help with bathing/dressing/bathroom;Help with stairs or ramp for entrance    Equipment Recommendations None recommended by PT  Recommendations for Other Services       Functional Status Assessment Patient has had a recent decline in their functional status and demonstrates the ability to make significant improvements in function in a reasonable and predictable amount of time.     Precautions / Restrictions Precautions Precautions: Fall Restrictions Weight Bearing Restrictions: Yes RLE Weight Bearing: Weight bearing as tolerated (in boot) LLE Weight Bearing: Non weight bearing (in boot) Other Position/Activity Restrictions: per chart review surgery scheduled for Monday 5/13 left ankle      Mobility  Bed Mobility Overal bed mobility: Needs Assistance Bed Mobility: Supine to Sit, Sit to Supine     Supine to sit: Mod assist Sit to supine: Max assist, Mod assist   General bed  mobility comments: takes extra effort and time for bed mobility; quickly fatigues and get short of breath.  Patient needs max A to return legs to bed from sitting and for scooting up n bed Patient Response: Cooperative  Transfers                   General transfer comment: patient unable to perform sit to stand to RW despite multiple attempts today; unable to clear  buttocks from bed    Ambulation/Gait                  Stairs            Wheelchair Mobility    Modified Rankin (Stroke Patients Only)       Balance Overall balance assessment: Needs assistance Sitting-balance support: Feet supported, Bilateral upper extremity supported Sitting balance-Leahy Scale: Fair Sitting balance - Comments: fair to good sitting balance on the edge of the bed.  fatigues quickly without back support Postural control: Posterior lean                                   Pertinent Vitals/Pain Pain Assessment Pain Assessment: No/denies pain    Home Living Family/patient expects to be discharged to:: Private residence Living Arrangements: Children Available Help at Discharge: Family;Personal care attendant;Available 24 hours/day Type of Home: Apartment Home Access: Ramped entrance       Home Layout: One level Home Equipment: Rollator (4 wheels);Shower seat;Wheelchair - manual;Hand held shower head;Grab bars - tub/shower Additional Comments: uses rollator at baseline    Prior Function Prior Level of Function : Needs assist             Mobility Comments: household ambulator with rollator ADLs Comments: aide assist with bathing in shower, meals and cleaning; has 24/7 assist; daughter lives with her     Hand Dominance   Dominant Hand: Right    Extremity/Trunk Assessment   Upper Extremity Assessment Upper Extremity Assessment: Generalized weakness    Lower Extremity Assessment Lower Extremity Assessment: Generalized weakness;RLE deficits/detail;LLE deficits/detail RLE Deficits / Details: WBAT in boot LLE Deficits / Details: NWBing in boot    Cervical / Trunk Assessment Cervical / Trunk Assessment:  (history of back surgery; back pain)  Communication   Communication: No difficulties  Cognition Arousal/Alertness: Awake/alert Behavior During Therapy: WFL for tasks assessed/performed Overall Cognitive Status:  Within Functional Limits for tasks assessed                                 General Comments: states she first thought she was in [facility] hospital        General Comments General comments (skin integrity, edema, etc.): unable to stand today    Exercises     Assessment/Plan    PT Assessment Patient needs continued PT services  PT Problem List Decreased strength;Obesity;Decreased activity tolerance;Decreased balance;Decreased mobility       PT Treatment Interventions DME instruction;Balance training;Gait training;Neuromuscular re-education;Functional mobility training;Patient/family education;Therapeutic activities;Therapeutic exercise    PT Goals (Current goals can be found in the Care Plan section)  Acute Rehab PT Goals Patient Stated Goal: return home with family and aide to assist PT Goal Formulation: With patient Time For Goal Achievement: 07/18/22 Potential to Achieve Goals: Good    Frequency Min 3X/week     Co-evaluation  AM-PAC PT "6 Clicks" Mobility  Outcome Measure Help needed turning from your back to your side while in a flat bed without using bedrails?: A Lot Help needed moving from lying on your back to sitting on the side of a flat bed without using bedrails?: A Lot Help needed moving to and from a bed to a chair (including a wheelchair)?: A Lot Help needed standing up from a chair using your arms (e.g., wheelchair or bedside chair)?: Total Help needed to walk in hospital room?: Total Help needed climbing 3-5 steps with a railing? : Total 6 Click Score: 9    End of Session Equipment Utilized During Treatment: Gait belt Activity Tolerance: Patient limited by fatigue;Patient tolerated treatment well Patient left: in bed;with call bell/phone within reach;with bed alarm set Nurse Communication: Mobility status PT Visit Diagnosis: Other abnormalities of gait and mobility (R26.89);Muscle weakness (generalized)  (M62.81);History of falling (Z91.81)    Time: 1610-9604 PT Time Calculation (min) (ACUTE ONLY): 27 min   Charges:   PT Evaluation $PT Eval Low Complexity: 1 Low          9:51 AM, 07/04/22 [place] Shaler Small Daltyn Degroat MPT [place] [facility] [place] [phone] Ph:[phone]

## 2022-07-05 DIAGNOSIS — S82831A Other fracture of upper and lower end of right fibula, initial encounter for closed fracture: Secondary | ICD-10-CM | POA: Diagnosis not present

## 2022-07-05 DIAGNOSIS — A419 Sepsis, unspecified organism: Secondary | ICD-10-CM | POA: Diagnosis not present

## 2022-07-05 DIAGNOSIS — N390 Urinary tract infection, site not specified: Secondary | ICD-10-CM | POA: Diagnosis not present

## 2022-07-05 DIAGNOSIS — I5022 Chronic systolic (congestive) heart failure: Secondary | ICD-10-CM | POA: Diagnosis not present

## 2022-07-05 LAB — BASIC METABOLIC PANEL
Anion gap: 12 (ref 5–15)
BUN: 24 mg/dL — ABNORMAL HIGH (ref 8–23)
CO2: 22 mmol/L (ref 22–32)
Calcium: 8.5 mg/dL — ABNORMAL LOW (ref 8.9–10.3)
Chloride: 101 mmol/L (ref 98–111)
Creatinine, Ser: 1.16 mg/dL — ABNORMAL HIGH (ref 0.44–1.00)
GFR, Estimated: 48 mL/min — ABNORMAL LOW (ref >60)
Glucose, Bld: 132 mg/dL — ABNORMAL HIGH (ref 70–99)
Potassium: 3.4 mmol/L — ABNORMAL LOW (ref 3.5–5.1)
Sodium: 135 mmol/L (ref 135–145)

## 2022-07-05 LAB — CULTURE, BLOOD (ROUTINE X 2)

## 2022-07-05 LAB — CBC
HCT: 31.2 % — ABNORMAL LOW (ref 36.0–46.0)
Hemoglobin: 10.5 g/dL — ABNORMAL LOW (ref 12.0–15.0)
MCH: 28.8 pg (ref 26.0–34.0)
MCHC: 33.7 g/dL (ref 30.0–36.0)
MCV: 85.5 fL (ref 80.0–100.0)
Platelets: 260 K/uL (ref 150–400)
RBC: 3.65 MIL/uL — ABNORMAL LOW (ref 3.87–5.11)
RDW: 14.4 % (ref 11.5–15.5)
WBC: 8.9 K/uL (ref 4.0–10.5)
nRBC: 0 % (ref 0.0–0.2)

## 2022-07-05 MED ORDER — POTASSIUM CHLORIDE CRYS ER 20 MEQ PO TBCR
20.0000 meq | EXTENDED_RELEASE_TABLET | Freq: Once | ORAL | Status: AC
  Administered 2022-07-05: 20 meq via ORAL
  Filled 2022-07-05: qty 1

## 2022-07-05 NOTE — [place] FL2 (Signed)
[place] MEDICAID FL2 LEVEL OF CARE FORM     IDENTIFICATION  Patient Name: Sheila [name] Birthdate: 09-29-1940 Sex: female Admission Date (Current Location): 06/30/2022  [facility] and [place] Number:  [employer] and Address:  [facility],  [address], [place] [address]      Provider Number: [id number]  Attending Physician Name and Address:  Tyrone Nine, MD  Relative Name and Phone Number:       Current Level of Care: Hospital Recommended Level of Care: Skilled Nursing Facility Prior Approval Number:    Date Approved/Denied:   PASRR Number: [id number]  Discharge Plan: SNF    Current Diagnoses: Patient Active Problem List   Diagnosis Date Noted   Closed bimalleolar fracture of left ankle 07/01/2022   Closed left ankle fracture 06/30/2022   Closed fracture of right distal fibula 06/30/2022   Moderate episode of recurrent major depressive disorder (HCC) 11/07/2021   Sepsis secondary to UTI (HCC) 06/26/2021   Acute metabolic encephalopathy 06/26/2021   SIRS (systemic inflammatory response syndrome) (HCC) 03/06/2021   Chronic systolic CHF (congestive heart failure) (HCC) 03/06/2021   Urinary incontinence 06/03/2020   Chronic kidney disease, stage 3 unspecified (HCC) 04/18/2020   Primary hyperparathyroidism (HCC) 04/02/2020   LV dysfunction 10/02/2019   NICM (nonischemic cardiomyopathy) (HCC) 10/02/2019   Shortness of breath 08/29/2019   Atypical chest pain 08/29/2019   Controlled substance agreement signed 07/26/2019   Osteoarthritis of right shoulder 02/24/2018   Smokeless tobacco use 11/11/2017   COPD (chronic obstructive pulmonary disease) (HCC) 09/09/2017   Unilateral primary osteoarthritis, right knee 03/04/2017   Spondylolisthesis of lumbar region 08/24/2016   Benzodiazepine dependence (HCC) 04/29/2015   Pain medication agreement signed 04/29/2015   Chronic back pain 04/29/2015   Osteoarthritis 04/29/2015   Metabolic syndrome  04/29/2015   Morbid obesity (HCC) 10/18/2014   Neuropathy 10/18/2014   Hypokalemia 10/18/2014   Depressed 09/05/2013   Osteoporosis 06/16/2012   PUD (peptic ulcer disease) 06/16/2012   GAD (generalized anxiety disorder) 06/16/2012   Hyperlipidemia 06/16/2012   DJD (degenerative joint disease) 06/16/2012   Hypercalcemia 06/16/2012   Essential hypertension, benign 06/16/2012   Gastric ulcer with hemorrhage 01/14/2011   Diverticulosis of colon 01/13/2011   Prerenal azotemia 01/13/2011   Leukocytosis 01/13/2011   Sacral insufficiency fracture 01/13/2011   Hypothyroidism 01/13/2011   Orthostatic lightheadedness 01/13/2011   ABNORMAL EKG 07/16/2009    Orientation RESPIRATION BLADDER Height & Weight     Self, Time, Situation, Place  Normal Incontinent, External catheter Weight: 242 lb 4.6 oz (109.9 kg) Height:  4\' 11"  (149.9 cm)  BEHAVIORAL SYMPTOMS/MOOD NEUROLOGICAL BOWEL NUTRITION STATUS      Incontinent Diet (Heart healthy)  AMBULATORY STATUS COMMUNICATION OF NEEDS Skin   Extensive Assist Verbally Normal                       Personal Care Assistance Level of Assistance  Bathing, Feeding, Dressing Bathing Assistance: Limited assistance Feeding assistance: Independent Dressing Assistance: Limited assistance     Functional Limitations Info  Sight, Hearing, Speech Sight Info: Adequate Hearing Info: Adequate Speech Info: Adequate    SPECIAL CARE FACTORS FREQUENCY  PT (By licensed PT), OT (By licensed OT)     PT Frequency: 5 times weekly OT Frequency: 5 times weekly            Contractures Contractures Info: Not present    Additional Factors Info  Code Status, Allergies Code Status Info: DNR Allergies Info:  Ibuprofen, Benadryl (Diphenhydramine Hcl), Lipitor (Atorvastatin), Baclofen, Hydrocodone, Codeine, Morphine And Related, Tdap (Tetanus-diphth-acell Pertussis)           Current Medications (07/05/2022):  This is the current hospital active medication  list Current Facility-Administered Medications  Medication Dose Route Frequency Provider Last Rate Last Admin   albuterol (PROVENTIL) (2.5 MG/3ML) 0.083% nebulizer solution 2.5 mg  2.5 mg Nebulization Q6H PRN Norins, Rosalyn Gess, MD       ALPRAZolam Prudy Feeler) tablet 0.5 mg  0.5 mg Oral BID PRN Jacques Navy, MD   0.5 mg at 07/04/22 1238   ceFAZolin (ANCEF) IVPB 1 g/50 mL premix  1 g Intravenous Q8H Ann Held, RPH 100 mL/hr at 07/05/22 0548 1 g at 07/05/22 0548   Chlorhexidine Gluconate Cloth 2 % PADS 6 each  6 each Topical Q0600 Jacques Navy, MD   6 each at 07/05/22 0548   cholecalciferol (VITAMIN D3) 25 MCG (1000 UNIT) tablet 1,000 Units  1,000 Units Oral Daily Jacques Navy, MD   1,000 Units at 07/05/22 0904   cinacalcet (SENSIPAR) tablet 30 mg  30 mg Oral Shelia Media, MD   30 mg at 07/05/22 1610   triamcinolone cream (KENALOG) 0.1 % cream   Topical BID Tyrone Nine, MD   Given at 07/05/22 9604   And   DermaCerin CREA 1 Application  1 Application Apply externally BID Tyrone Nine, MD   1 Application at 07/05/22 0905   fentaNYL (SUBLIMAZE) injection 25 mcg  25 mcg Intravenous Q2H PRN Tyrone Nine, MD   25 mcg at 07/03/22 1246   gabapentin (NEURONTIN) capsule 300 mg  300 mg Oral TID Jacques Navy, MD   300 mg at 07/05/22 0904   guaiFENesin (MUCINEX) 12 hr tablet 600 mg  600 mg Oral BID Zierle-Ghosh, Asia B, DO   600 mg at 07/05/22 0904   heparin injection 5,000 Units  5,000 Units Subcutaneous Q8H Tyrone Nine, MD   5,000 Units at 07/05/22 0548   hydrOXYzine (ATARAX) tablet 25 mg  25 mg Oral TID PRN Leander Rams, RPH   25 mg at 07/03/22 1524   levothyroxine (SYNTHROID) tablet 100 mcg  100 mcg Oral Q0600 Jacques Navy, MD   100 mcg at 07/05/22 0548   loperamide (IMODIUM) capsule 2 mg  2 mg Oral PRN Tyrone Nine, MD   2 mg at 07/02/22 1458   loratadine (CLARITIN) tablet 10 mg  10 mg Oral Daily Leander Rams, RPH   10 mg at 07/05/22 5409   methocarbamol  (ROBAXIN) tablet 500 mg  500 mg Oral Q6H PRN Tyrone Nine, MD   500 mg at 07/04/22 1955   metoprolol tartrate (LOPRESSOR) tablet 12.5 mg  12.5 mg Oral BID Jacques Navy, MD   12.5 mg at 07/05/22 0904   mometasone-formoterol (DULERA) 200-5 MCG/ACT inhaler 2 puff  2 puff Inhalation BID Leander Rams, RPH   2 puff at 07/05/22 8119   Muscle Rub CREA   Topical PRN Tyrone Nine, MD       ondansetron (ZOFRAN-ODT) disintegrating tablet 4 mg  4 mg Oral Q8H PRN Tyrone Nine, MD   4 mg at 07/05/22 0236   oxyCODONE (Oxy IR/ROXICODONE) immediate release tablet 2.5-5 mg  2.5-5 mg Oral Q4H PRN Tyrone Nine, MD   5 mg at 07/05/22 0231   pantoprazole (PROTONIX) EC tablet 40 mg  40 mg Oral BID Norins, Rosalyn Gess, MD  40 mg at 07/05/22 0904   pravastatin (PRAVACHOL) tablet 80 mg  80 mg Oral q1800 Jacques Navy, MD   80 mg at 07/04/22 1613   sacubitril-valsartan (ENTRESTO) 97-103 mg per tablet  1 tablet Oral BID Tyrone Nine, MD   1 tablet at 07/05/22 5409   sertraline (ZOLOFT) tablet 100 mg  100 mg Oral Daily Jacques Navy, MD   100 mg at 07/05/22 8119   spironolactone (ALDACTONE) tablet 25 mg  25 mg Oral Daily Tyrone Nine, MD   25 mg at 07/05/22 0904   torsemide (DEMADEX) tablet 20 mg  20 mg Oral BID Tyrone Nine, MD   20 mg at 07/05/22 0904   umeclidinium bromide (INCRUSE ELLIPTA) 62.5 MCG/ACT 1 puff  1 puff Inhalation Daily Leander Rams, RPH   1 puff at 07/05/22 1478     Discharge Medications: Please see discharge summary for a list of discharge medications.  Relevant Imaging Results:  Relevant Lab Results:   Additional Information SSN: [id number] [place]

## 2022-07-05 NOTE — TOC Progression Note (Signed)
Transition of Care [facility]) - Progression Note    Patient Details  Name: Sheila [name] MRN: [id number] Date of Birth: [birth year]  [facility] CM/SW Contact  [place], [place] Phone Number: 07/05/2022, 11:44 AM  Clinical Narrative:    CSW spoke with pts daughter about PT recommendation for SNF. Pts daughter states they are agreeable to SNF referral but she will speak with pt and other sister to confirm. She would like SNF referral sent out to local facilities. TOC to follow with pt and family on final D/C plan after pt has surgery. TOC to follow.   Expected Discharge Plan:  (Unknown at this time, PT will work will pt when more stable.) Barriers to Discharge: Continued Medical Work up  Expected Discharge Plan and Services In-house Referral: Clinical Social Work Discharge Planning Services: CM Consult   Living arrangements for the past 2 months: Single Family Home                                       Social Determinants of Health (SDOH) Interventions SDOH Screenings   Food Insecurity: No Food Insecurity (05/13/2022)  Housing: Low Risk  (05/13/2022)  Transportation Needs: No Transportation Needs (05/13/2022)  Utilities: Not At Risk (05/13/2022)  Alcohol Screen: Low Risk  (05/13/2022)  Depression (PHQ2-9): Low Risk  (05/13/2022)  Financial Resource Strain: Low Risk  (05/13/2022)  Physical Activity: Insufficiently Active (05/13/2022)  Social Connections: Socially Isolated (05/13/2022)  Stress: No Stress Concern Present (05/13/2022)  Tobacco Use: High Risk (07/03/2022)    Readmission Risk Interventions    07/01/2022   10:48 AM 06/27/2021   12:48 PM  Readmission Risk Prevention Plan  Transportation Screening Complete Complete  PCP or Specialist Appt within 3-5 Days  Not Complete  HRI or Home Care Consult Complete Complete  Social Work Consult for Recovery Care Planning/Counseling Complete Complete  Palliative Care Screening Not Applicable Not Applicable   Medication Review Oceanographer) Complete Complete

## 2022-07-05 NOTE — Progress Notes (Signed)
[facility] PROGRESS NOTE  Sheila [name] (DOB: 25-Jun-1940) WUJ:[id number] PCP: Junie Spencer, FNP  Brief Narrative: LASHEKA BOGDON is an 82 y.o. female with a history of COPD, HFrEF, chronic pain, anxiety, PUD who presented to the ED on 06/30/2022 due to worsening diffuse weakness culminating in a fall at home and subsequent inability to bear weight. EMS reported tachycardia and hypotension. Due to reports of change in urination and dysuria, ceftriaxone was given IM by EMS en route along with IV fluids. In the ED, T 97.5  BP 77/49 - to 95/59 after fluid bolus. HR 66 RR 19. Patient appeared ill, had bruised ankles that were tender. Code sepsis initiated in the field as above. IN ED she received 1 L IVF then LR at 150/hr. She receved Rocephin in the field and Vancomycin in ED. Lab: glucose 124, WBC 20.6 with 82/8/9 BUN 26, Cr 1.91 (baseline 24/1.3) lactic acid 1.9 to 1.7 after resuscitation. BNP 991 (baseline 54) U/A - cloudy, LE lrg, many bacteria/hpf, >50 wbc/hpf. X-ray revealed bilateral ankle fractures. TR[facility] called to admit for continued management of urosepsis and chronic disease mgt.  Foley catheter was inserted due to urinary retention, TOV planned 5/12. With antibiotics, sepsis physiology has resolved, leukocytosis resolved, and antibiotics have been narrowed to ancef per culture data. Cardiology was consulted, recommending no further testing prior to operative fixation of left ankle fracture which is planned 5/13. PT evaluations are ongoing.   Subjective: No new complaints, got foley taken out recently and has not voided, but it was very recently. No uncontrolled pain. Wants to make sure she gets prescription for pain medications after surgery, doesn't want to go to a nursing facility. Her sister died in one. No family at bedside.   Objective: BP (!) 145/82 (BP Location: Left Arm)   Pulse 77   Temp 98.9 F (37.2 C)   Resp 16   Ht 4\' 11"  (1.499 m)   Wt 109.9 kg   SpO2 93%   BMI 48.94  kg/m   Gen: No distress Pulm: Clear, nonlabored  CV: RRR, no MRG or edema GI: Soft, NT, ND, +BS  Neuro: Alert and oriented. No new focal deficits. Ext: Warm, no deformities. LE's with boots in place, NVI. Skin: No rashes, lesions or ulcers on visualized skin   Assessment & Plan: Principal Problem:   Sepsis secondary to UTI [facility]) Active Problems:   Prerenal azotemia   COPD (chronic obstructive pulmonary disease) (HCC)   Chronic systolic CHF (congestive heart failure) (HCC)   Closed left ankle fracture   Closed fracture of right distal fibula   PUD (peptic ulcer disease)   GAD (generalized anxiety disorder)   Essential hypertension, benign   Closed bimalleolar fracture of left ankle  Severe sepsis due to UTI: with organ dysfunction (AKI) - Based on urine culture susceptibilities, continue ancef. Leukocytosis resolved, remaining afebrile, blood cultures remain NGTD x4 days.   Right distal fibula fracture:  - Orthopedics evaluated, recommends NWB RLE, no surgery.  - Continue pain control as ordered with oxycodone (tolerated despite multiple allergies).    Left bimalleolar ankle fracture:  - WBAT in walking boot, anticipate operative fixation per orthopedics on 5/13. Holding heparin night before, made NPO p MN.  - Get PT evaluations. The patient was declining functionally at home, then fell, is now more impaired functionally than before. I will strongly suggest that she go to short term rehab postoperatively.  Chronic combined HFrEF, HTN: Last echo Feb 2024 showed LVEF 35-40% (down  from Jan 2023 at 45-50%), G1DD, global hypokinesis, mildly reduced RV systolic function, mild enlargement, normal estimated PA pressure, RVSP 25.   - Restarted home entresto, spironolactone, torsemide. Appears stable. - She is at intermediate-to-high, but not prohibitive, perioperative risk per cardiology who has made recommendations for the perioperative period. Appreciate their preoperative evaluation. No  further testing recommended. Will remain on telemetry monitoring.  Normocytic anemia: Likely due to fractures and exaggerated by hemodilution. No gross bleeding currently. - Continue monitoring with transfusion threshold of 7-8 g/dl depending on symptoms.   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 SCr elevated and rising. SCr baseline 1.3-1.4, was 1.9 > 2.2 > now back to baseline. - Renal U/S showed no hydro, though has had urinary retention. Remove foley for TOV 5/12.   - Continue holding nephrotoxins. - Continue home sensipar  COPD:  - Continue formulary equivalent controller BD and prn albuterol  Hyponatremia: Improved.  Hypokalemia:  - Supplement and monitor  GAD:  - Continue SSRI - prn alprazolam, hydroxyzine  PUD: No abd pain. With renal impairment, this contraindicates systemic NSAID use. - Continue PPI.   Hypothyroidism: Recent TSH 3.300.  - Continue synthroid.   HLD:  - Continue statin   Chronic neck pain:  - Robaxin prn - Topical camphor (takes this at home), could try voltaren topically if ineffective.   Dry skin:  - Topical Tx ordered. May also benefit from mobilizing OOB.  Loose stools: Chronic. No abd pain, remains with benign exam.  - Imodium prn is ok.  Morbid obesity: Body mass index is 48.94 kg/m.   Nocturnal hypoxia:  - Sleep study recommended as outpatient  Tyrone Nine, MD [facility] [email or web address] 07/05/2022, 11:25 AM

## 2022-07-05 NOTE — Plan of Care (Signed)
Patient Sheila [name], disoriented to time.  VSS throughout shift.  All meds given on time as ordered.  Pt c/o pain relieved by PRN oxycodone.  Foley care provided with CHG bath.  Pt c/o nausea relieved by PRN zofran.  POC maintained, will continue to monitor.  Problem: Fluid Volume: Goal: Hemodynamic stability will improve Outcome: Progressing   Problem: Clinical Measurements: Goal: Diagnostic test results will improve Outcome: Progressing Goal: Signs and symptoms of infection will decrease Outcome: Progressing   Problem: Respiratory: Goal: Ability to maintain adequate ventilation will improve Outcome: Progressing   Problem: Education: Goal: Knowledge of General Education information will improve Description: Including pain rating scale, medication(s)/side effects and non-pharmacologic comfort measures Outcome: Progressing   Problem: Health Behavior/Discharge Planning: Goal: Ability to manage health-related needs will improve Outcome: Progressing   Problem: Clinical Measurements: Goal: Ability to maintain clinical measurements within normal limits will improve Outcome: Progressing Goal: Will remain free from infection Outcome: Progressing Goal: Diagnostic test results will improve Outcome: Progressing Goal: Respiratory complications will improve Outcome: Progressing Goal: Cardiovascular complication will be avoided Outcome: Progressing   Problem: Activity: Goal: Risk for activity intolerance will decrease Outcome: Progressing   Problem: Nutrition: Goal: Adequate nutrition will be maintained Outcome: Progressing   Problem: Coping: Goal: Level of anxiety will decrease Outcome: Progressing   Problem: Elimination: Goal: Will not experience complications related to bowel motility Outcome: Progressing Goal: Will not experience complications related to urinary retention Outcome: Progressing   Problem: Pain Managment: Goal: General experience of comfort will improve Outcome:  Progressing   Problem: Safety: Goal: Ability to remain free from injury will improve Outcome: Progressing   Problem: Skin Integrity: Goal: Risk for impaired skin integrity will decrease Outcome: Progressing

## 2022-07-06 ENCOUNTER — Inpatient Hospital Stay (HOSPITAL_COMMUNITY): Payer: 59

## 2022-07-06 ENCOUNTER — Ambulatory Visit: Payer: 59 | Admitting: Physical Therapy

## 2022-07-06 ENCOUNTER — Inpatient Hospital Stay (HOSPITAL_COMMUNITY): Payer: 59 | Admitting: Anesthesiology

## 2022-07-06 ENCOUNTER — Encounter (HOSPITAL_COMMUNITY): Payer: Self-pay | Admitting: Internal Medicine

## 2022-07-06 ENCOUNTER — Encounter (HOSPITAL_COMMUNITY)
Admission: EM | Disposition: A | Discharge status: Skilled Nursing Facility | Payer: Self-pay | Source: Home / Self Care | Attending: Family Medicine

## 2022-07-06 DIAGNOSIS — S82842A Displaced bimalleolar fracture of left lower leg, initial encounter for closed fracture: Secondary | ICD-10-CM | POA: Diagnosis not present

## 2022-07-06 DIAGNOSIS — I5022 Chronic systolic (congestive) heart failure: Secondary | ICD-10-CM | POA: Diagnosis not present

## 2022-07-06 DIAGNOSIS — S82831A Other fracture of upper and lower end of right fibula, initial encounter for closed fracture: Secondary | ICD-10-CM | POA: Diagnosis not present

## 2022-07-06 DIAGNOSIS — A419 Sepsis, unspecified organism: Secondary | ICD-10-CM | POA: Diagnosis not present

## 2022-07-06 DIAGNOSIS — N390 Urinary tract infection, site not specified: Secondary | ICD-10-CM | POA: Diagnosis not present

## 2022-07-06 HISTORY — PX: ORIF ANKLE FRACTURE: SHX5408

## 2022-07-06 LAB — BLOOD GAS, VENOUS
Acid-Base Excess: 0.9 mmol/L (ref 0.0–2.0)
Bicarbonate: 25.2 mmol/L (ref 20.0–28.0)
Drawn by: 66297
O2 Saturation: 70.2 %
Patient temperature: 36.8
pCO2, Ven: 38 mmHg — ABNORMAL LOW (ref 44–60)
pH, Ven: 7.43 (ref 7.25–7.43)
pO2, Ven: 38 mmHg (ref 32–45)

## 2022-07-06 LAB — BASIC METABOLIC PANEL
Anion gap: 11 (ref 5–15)
BUN: 25 mg/dL — ABNORMAL HIGH (ref 8–23)
CO2: 24 mmol/L (ref 22–32)
Calcium: 8.4 mg/dL — ABNORMAL LOW (ref 8.9–10.3)
Chloride: 100 mmol/L (ref 98–111)
Creatinine, Ser: 1.25 mg/dL — ABNORMAL HIGH (ref 0.44–1.00)
GFR, Estimated: 44 mL/min — ABNORMAL LOW (ref >60)
Glucose, Bld: 128 mg/dL — ABNORMAL HIGH (ref 70–99)
Potassium: 3.4 mmol/L — ABNORMAL LOW (ref 3.5–5.1)
Sodium: 135 mmol/L (ref 135–145)

## 2022-07-06 LAB — CBC
HCT: 32 % — ABNORMAL LOW (ref 36.0–46.0)
Hemoglobin: 10.5 g/dL — ABNORMAL LOW (ref 12.0–15.0)
MCH: 28.1 pg (ref 26.0–34.0)
MCHC: 32.8 g/dL (ref 30.0–36.0)
MCV: 85.6 fL (ref 80.0–100.0)
Platelets: 269 10*3/uL (ref 150–400)
RBC: 3.74 MIL/uL — ABNORMAL LOW (ref 3.87–5.11)
RDW: 14.5 % (ref 11.5–15.5)
WBC: 9.1 10*3/uL (ref 4.0–10.5)
nRBC: 0 % (ref 0.0–0.2)

## 2022-07-06 LAB — SURGICAL PCR SCREEN
MRSA, PCR: NEGATIVE
Staphylococcus aureus: NEGATIVE

## 2022-07-06 SURGERY — OPEN REDUCTION INTERNAL FIXATION (ORIF) ANKLE FRACTURE
Anesthesia: General | Site: Ankle | Laterality: Left

## 2022-07-06 MED ORDER — IPRATROPIUM-ALBUTEROL 0.5-2.5 (3) MG/3ML IN SOLN: 3.0000 mL | Freq: Four times a day (QID) | RESPIRATORY_TRACT | Status: DC | PRN

## 2022-07-06 MED ORDER — LIDOCAINE HCL (PF) 1 % IJ SOLN
INTRAMUSCULAR | Status: AC
  Filled 2022-07-06: qty 30

## 2022-07-06 MED ORDER — 0.9 % SODIUM CHLORIDE (POUR BTL) OPTIME
TOPICAL | Status: DC | PRN
  Administered 2022-07-06: 1000 mL

## 2022-07-06 MED ORDER — CEFAZOLIN SODIUM-DEXTROSE 2-4 GM/100ML-% IV SOLN
2.0000 g | Freq: Once | INTRAVENOUS | Status: AC
  Administered 2022-07-06: 2 g via INTRAVENOUS

## 2022-07-06 MED ORDER — BUPIVACAINE HCL (PF) 0.25 % IJ SOLN
INTRAMUSCULAR | Status: AC
  Filled 2022-07-06: qty 60

## 2022-07-06 MED ORDER — BUPIVACAINE HCL (PF) 0.5 % IJ SOLN
INTRAMUSCULAR | Status: AC
  Filled 2022-07-06: qty 30

## 2022-07-06 MED ORDER — ESMOLOL HCL 100 MG/10ML IV SOLN
INTRAVENOUS | Status: DC | PRN
  Administered 2022-07-06: 30 mg via INTRAVENOUS
  Administered 2022-07-06: 20 mg via INTRAVENOUS

## 2022-07-06 MED ORDER — DEXAMETHASONE SODIUM PHOSPHATE 10 MG/ML IJ SOLN
INTRAMUSCULAR | Status: AC
  Filled 2022-07-06: qty 1

## 2022-07-06 MED ORDER — SUGAMMADEX SODIUM 200 MG/2ML IV SOLN
INTRAVENOUS | Status: DC | PRN
  Administered 2022-07-06: 200 mg via INTRAVENOUS

## 2022-07-06 MED ORDER — SUCCINYLCHOLINE CHLORIDE 200 MG/10ML IV SOSY
PREFILLED_SYRINGE | INTRAVENOUS | Status: AC
  Filled 2022-07-06: qty 10

## 2022-07-06 MED ORDER — IPRATROPIUM-ALBUTEROL 0.5-2.5 (3) MG/3ML IN SOLN
RESPIRATORY_TRACT | Status: AC
  Filled 2022-07-06: qty 3

## 2022-07-06 MED ORDER — PROPOFOL 10 MG/ML IV BOLUS
INTRAVENOUS | Status: AC
  Filled 2022-07-06: qty 20

## 2022-07-06 MED ORDER — ESMOLOL HCL 100 MG/10ML IV SOLN
INTRAVENOUS | Status: AC
  Filled 2022-07-06: qty 10

## 2022-07-06 MED ORDER — ALBUTEROL SULFATE (2.5 MG/3ML) 0.083% IN NEBU: 2.5000 mg | INHALATION_SOLUTION | RESPIRATORY_TRACT | Status: DC | PRN

## 2022-07-06 MED ORDER — LACTATED RINGERS IV SOLN: INTRAVENOUS | Status: DC

## 2022-07-06 MED ORDER — DEXAMETHASONE SODIUM PHOSPHATE 4 MG/ML IJ SOLN
INTRAMUSCULAR | Status: DC | PRN
  Administered 2022-07-06: 5 mg via PERINEURAL

## 2022-07-06 MED ORDER — DEXAMETHASONE SODIUM PHOSPHATE 4 MG/ML IJ SOLN
INTRAMUSCULAR | Status: AC
  Filled 2022-07-06: qty 2

## 2022-07-06 MED ORDER — PHENYLEPHRINE 80 MCG/ML (10ML) SYRINGE FOR IV PUSH (FOR BLOOD PRESSURE SUPPORT)
PREFILLED_SYRINGE | INTRAVENOUS | Status: DC | PRN
  Administered 2022-07-06 (×5): 80 ug via INTRAVENOUS

## 2022-07-06 MED ORDER — FENTANYL CITRATE (PF) 250 MCG/5ML IJ SOLN
INTRAMUSCULAR | Status: AC
  Filled 2022-07-06: qty 5

## 2022-07-06 MED ORDER — FENTANYL CITRATE PF 50 MCG/ML IJ SOSY: 50.0000 ug | PREFILLED_SYRINGE | INTRAMUSCULAR | Status: DC | PRN

## 2022-07-06 MED ORDER — LIDOCAINE HCL (PF) 2 % IJ SOLN
INTRAMUSCULAR | Status: AC
  Filled 2022-07-06: qty 5

## 2022-07-06 MED ORDER — CEFAZOLIN SODIUM-DEXTROSE 2-4 GM/100ML-% IV SOLN
INTRAVENOUS | Status: AC
  Filled 2022-07-06: qty 100

## 2022-07-06 MED ORDER — SUCCINYLCHOLINE CHLORIDE 200 MG/10ML IV SOSY
PREFILLED_SYRINGE | INTRAVENOUS | Status: DC | PRN
  Administered 2022-07-06: 120 mg via INTRAVENOUS

## 2022-07-06 MED ORDER — LIDOCAINE HCL (CARDIAC) PF 100 MG/5ML IV SOSY
PREFILLED_SYRINGE | INTRAVENOUS | Status: DC | PRN
  Administered 2022-07-06: 80 mg via INTRATRACHEAL

## 2022-07-06 MED ORDER — IPRATROPIUM-ALBUTEROL 0.5-2.5 (3) MG/3ML IN SOLN
3.0000 mL | Freq: Once | RESPIRATORY_TRACT | Status: AC
  Administered 2022-07-06: 3 mL via RESPIRATORY_TRACT

## 2022-07-06 MED ORDER — PROPOFOL 10 MG/ML IV BOLUS
INTRAVENOUS | Status: DC | PRN
  Administered 2022-07-06: 80 mg via INTRAVENOUS

## 2022-07-06 MED ORDER — ORAL CARE MOUTH RINSE: 15.0000 mL | Freq: Once | OROMUCOSAL | Status: DC

## 2022-07-06 MED ORDER — FENTANYL CITRATE (PF) 100 MCG/2ML IJ SOLN
INTRAMUSCULAR | Status: DC | PRN
  Administered 2022-07-06 (×2): 50 ug via INTRAVENOUS

## 2022-07-06 MED ORDER — FENTANYL CITRATE PF 50 MCG/ML IJ SOSY
PREFILLED_SYRINGE | INTRAMUSCULAR | Status: AC
  Filled 2022-07-06: qty 1

## 2022-07-06 MED ORDER — POTASSIUM CHLORIDE IN NACL 20-0.45 MEQ/L-% IV SOLN: INTRAVENOUS | Status: AC

## 2022-07-06 MED ORDER — ROCURONIUM BROMIDE 10 MG/ML (PF) SYRINGE
PREFILLED_SYRINGE | INTRAVENOUS | Status: AC
  Filled 2022-07-06: qty 10

## 2022-07-06 MED ORDER — BUPIVACAINE HCL (PF) 0.25 % IJ SOLN
INTRAMUSCULAR | Status: DC | PRN
  Administered 2022-07-06: 24 mL via PERINEURAL

## 2022-07-06 MED ORDER — ONDANSETRON HCL 4 MG/2ML IJ SOLN
INTRAMUSCULAR | Status: AC
  Filled 2022-07-06: qty 2

## 2022-07-06 MED ORDER — DEXAMETHASONE SODIUM PHOSPHATE 10 MG/ML IJ SOLN
INTRAMUSCULAR | Status: DC | PRN
  Administered 2022-07-06: 5 mg via INTRAVENOUS

## 2022-07-06 MED ORDER — METHYLPREDNISOLONE SODIUM SUCC 125 MG IJ SOLR
125.0000 mg | Freq: Two times a day (BID) | INTRAMUSCULAR | Status: DC
  Administered 2022-07-06: 125 mg via INTRAVENOUS
  Filled 2022-07-06: qty 2

## 2022-07-06 MED ORDER — ROCURONIUM BROMIDE 10 MG/ML (PF) SYRINGE
PREFILLED_SYRINGE | INTRAVENOUS | Status: DC | PRN
  Administered 2022-07-06: 50 mg via INTRAVENOUS

## 2022-07-06 MED ORDER — CHLORHEXIDINE GLUCONATE 0.12 % MT SOLN: 15.0000 mL | Freq: Once | OROMUCOSAL | Status: DC

## 2022-07-06 MED ORDER — BUPIVACAINE-EPINEPHRINE (PF) 0.5% -1:200000 IJ SOLN
INTRAMUSCULAR | Status: DC | PRN
  Administered 2022-07-06: 5 mL

## 2022-07-06 SURGICAL SUPPLY — 50 items
APL PRP STRL LF DISP 70% ISPRP (MISCELLANEOUS) ×2
BANDAGE ESMARK 4X12 BL STRL LF (DISPOSABLE) ×1 IMPLANT
BLADE SURG 15 STRL LF DISP TIS (BLADE) ×1 IMPLANT
BLADE SURG 15 STRL SS (BLADE) ×1
BNDG CMPR 12X4 ELC STRL LF (DISPOSABLE) ×1
BNDG CMPR STD VLCR NS LF 5.8X4 (GAUZE/BANDAGES/DRESSINGS) ×1
BNDG ELASTIC 4X5.8 VLCR NS LF (GAUZE/BANDAGES/DRESSINGS) ×1 IMPLANT
BNDG ESMARK 4X12 BLUE STRL LF (DISPOSABLE) ×1
CHLORAPREP W/TINT 26 (MISCELLANEOUS) ×1 IMPLANT
CLOTH BEACON ORANGE TIMEOUT ST (SAFETY) ×1 IMPLANT
COVER LIGHT HANDLE STERIS (MISCELLANEOUS) ×2 IMPLANT
CUFF TOURN SGL QUICK 42 (TOURNIQUET CUFF) IMPLANT
DRAPE C-ARM FOLDED MOBILE STRL (DRAPES) ×1 IMPLANT
DRAPE C-ARMOR (DRAPES) ×1 IMPLANT
DRSG TEGADERM 4X4.75 (GAUZE/BANDAGES/DRESSINGS) IMPLANT
ELECT REM PT RETURN 9FT ADLT (ELECTROSURGICAL) ×1
ELECTRODE REM PT RTRN 9FT ADLT (ELECTROSURGICAL) ×1 IMPLANT
FIBULOCK IMPLANT SYSTEM STER (Miscellaneous) ×1 IMPLANT
GAUZE 4X4 16PLY ~~LOC~~+RFID DBL (SPONGE) IMPLANT
GAUZE SPONGE 4X4 12PLY STRL (GAUZE/BANDAGES/DRESSINGS) ×1 IMPLANT
GAUZE XEROFORM 1X8 LF (GAUZE/BANDAGES/DRESSINGS) ×1 IMPLANT
GLOVE BIO SURGEON STRL SZ8 (GLOVE) ×2 IMPLANT
GLOVE BIOGEL PI IND STRL 7.0 (GLOVE) ×2 IMPLANT
GLOVE SRG 8 PF TXTR STRL LF DI (GLOVE) ×1 IMPLANT
GLOVE SURG UNDER POLY LF SZ8 (GLOVE) ×1
GOWN STRL REUS W/ TWL XL LVL3 (GOWN DISPOSABLE) ×1 IMPLANT
GOWN STRL REUS W/TWL LRG LVL3 (GOWN DISPOSABLE) ×2 IMPLANT
GOWN STRL REUS W/TWL XL LVL3 (GOWN DISPOSABLE) ×1
INST SET MINOR BONE (KITS) ×1 IMPLANT
KIT INST FIBULOCK NL STL DISP (Miscellaneous) IMPLANT
KIT TURNOVER KIT A (KITS) ×1 IMPLANT
MANIFOLD NEPTUNE II (INSTRUMENTS) ×1 IMPLANT
NAIL FIBULOCK 3.0X130 LEFT (Nail) IMPLANT
NDL HYPO 21X1.5 SAFETY (NEEDLE) IMPLANT
NEEDLE HYPO 21X1.5 SAFETY (NEEDLE) ×1 IMPLANT
NS IRRIG 1000ML POUR BTL (IV SOLUTION) ×1 IMPLANT
PACK BASIC LIMB (CUSTOM PROCEDURE TRAY) ×1 IMPLANT
PAD ARMBOARD 7.5X6 YLW CONV (MISCELLANEOUS) ×1 IMPLANT
SCREW CAN THREAD 3X18 (Screw) IMPLANT
SCREW CANCELLOUS 3X16MM (Screw) IMPLANT
SET BASIN LINEN APH (SET/KITS/TRAYS/PACK) ×1 IMPLANT
SPONGE T-LAP 18X18 ~~LOC~~+RFID (SPONGE) ×1 IMPLANT
SUT ETHILON 3 0 FSL (SUTURE) ×1 IMPLANT
SUT MON AB 0 CT1 (SUTURE) IMPLANT
SUT MON AB 2-0 CT1 36 (SUTURE) IMPLANT
SUT VIC AB 2-0 CT1 27 (SUTURE)
SUT VIC AB 2-0 CT1 TAPERPNT 27 (SUTURE) ×1 IMPLANT
SYR 30ML LL (SYRINGE) IMPLANT
SYR BULB IRRIG 60ML STRL (SYRINGE) ×1 IMPLANT
SYSTEM IMPLANT FIBULOCK STRL (Miscellaneous) ×1 IMPLANT

## 2022-07-06 NOTE — Progress Notes (Signed)
Pt found to be using SNUS (smokeless tobacco product), informed pt that she is not allowed to use tobacco products on campus, as well as educated pt on potential impairment of fracture healing and reduced circulation. Pt handed over snus/ spoon, labeled with pt sticker and placed out of reach to be sent home with family tomorrow.

## 2022-07-06 NOTE — Anesthesia Preprocedure Evaluation (Addendum)
Anesthesia Evaluation  Patient identified by MRN, date of birth, ID band Patient awake    Reviewed: Allergy & Precautions, H&P , NPO status , Patient's Chart, lab work & pertinent test results, reviewed documented beta blocker date and time   History of Anesthesia Complications (+) Family history of anesthesia reaction  Airway Mallampati: III  TM Distance: >3 FB Neck ROM: Full    Dental  (+) Dental Advisory Given, Missing   Pulmonary shortness of breath and with exertion, COPD,  COPD inhaler, former smoker   Pulmonary exam normal breath sounds clear to auscultation       Cardiovascular Exercise Tolerance: Poor hypertension, Pt. on medications and Pt. on home beta blockers + CAD and +CHF  Normal cardiovascular exam Rhythm:Regular Rate:Normal  1. Left ventricular ejection fraction, by estimation, is 35 to 40%. The  left ventricle has moderately decreased function. The left ventricle  demonstrates global hypokinesis. Left ventricular diastolic parameters are  consistent with Grade I diastolic  dysfunction (impaired relaxation).   2. Right ventricular systolic function is mildly reduced. The right  ventricular size is mildly enlarged. There is normal pulmonary artery  systolic pressure. The estimated right ventricular systolic pressure is  25.1 mmHg.   3. The mitral valve is normal in structure. No evidence of mitral valve  regurgitation. No evidence of mitral stenosis.   4. The aortic valve is tricuspid. There is mild calcification of the  aortic valve. Aortic valve regurgitation is not visualized. No aortic  stenosis is present.   5. The inferior vena cava is normal in size with greater than 50%  respiratory variability, suggesting right atrial pressure of 3 mmHg.   Comparison(s): Changes from prior study are noted. LVEF decreased from  45-50% to 35-40% now.     Neuro/Psych  Headaches PSYCHIATRIC DISORDERS Anxiety  Depression     Neuromuscular disease    GI/Hepatic Neg liver ROS, PUD,GERD  Medicated and Controlled,,  Endo/Other  Hypothyroidism    Renal/GU Renal InsufficiencyRenal disease  negative genitourinary   Musculoskeletal negative musculoskeletal ROS (+)    Abdominal   Peds negative pediatric ROS (+)  Hematology negative hematology ROS (+)   Anesthesia Other Findings   Reproductive/Obstetrics negative OB ROS                             Anesthesia Physical Anesthesia Plan  ASA: 4  Anesthesia Plan: General   Post-op Pain Management: Dilaudid IV and Regional block*   Induction: Intravenous  PONV Risk Score and Plan: Ondansetron and Dexamethasone  Airway Management Planned: Oral ETT  Additional Equipment:   Intra-op Plan:   Post-operative Plan: Extubation in OR  Informed Consent: I have reviewed the patients History and Physical, chart, labs and discussed the procedure including the risks, benefits and alternatives for the proposed anesthesia with the patient or authorized representative who has indicated his/her understanding and acceptance.    Discussed DNR with patient and Suspend DNR.   Dental advisory given  Plan Discussed with: CRNA and Surgeon  Anesthesia Plan Comments:         Anesthesia Quick Evaluation

## 2022-07-06 NOTE — Op Note (Signed)
Orthopaedic Surgery Operative Note (CSN: [id number])  Sheila [name]  [birth year] Date of Surgery: 07/06/2022   Diagnoses:  Left distal fibula fracture, with medial clear space widening.  Functional bimalleolar ankle fracture  Procedure: Operative fixation of left distal fibula fracture with a fibulock, intramedullary nail   Operative Finding Successful completion of the planned procedure.  Placement of a 3 mm x 130 mm fibula lock intramedullary nail, with stabilization of the talar position, and reduction of medial clear space widening.   Post-Op Diagnosis: Same Surgeons:Primary: Oliver Barre, MD Assistants: None Location: [facility] OR ROOM 4 Anesthesia: General with regional anesthesia Antibiotics: Ancef 2 g Tourniquet time:  Total Tourniquet Time Documented: Thigh (Left) - 39 minutes Total: Thigh (Left) - 39 minutes  Estimated Blood Loss: 30 cc Complications: None Specimens: None  Implants: Implant Name Type Inv. Item Serial No. Manufacturer Lot No. LRB No. Used Action  [employer] FIBULOCK NAIL 3.0 X Nail   [employer] [id number] Left 1 Implanted  SCREW CANCELLOUS 3X16MM - SSTERILE ON SET Screw SCREW CANCELLOUS 3X16MM STERILE ON SET [employer]  Left 1 Implanted  SCREW CAN THREAD 3X18 - SSTERILE ON SET Screw SCREW CAN THREAD 3X18 STERILE ON SET [employer]  Left 1 Implanted    Indications for Surgery:   Sheila [name] is a 82 y.o. female who lost her balance and fell.  She was diagnosed with urosepsis, and noted to have bilateral ankle fractures.  Her right ankle was a distal fibula fracture, nondisplaced that can be treated without surgery.  Unfortunately, the left ankle demonstrated a functional bimalleolar ankle fracture with distal fibula injury, and medial clear space widening.  As such, I recommended operative fixation, in order to allow her to get back on her feet quicker.  Benefits and risks of operative and nonoperative management were discussed prior to surgery with the  patient and informed consent form was completed.  Specific risks including infection, need for additional surgery, bleeding, malunion, nonunion, persistent pain, damage to surrounding structures and more severe complications associate with anesthesia.  She elected proceed.  Surgical consent was finalized.   Procedure:   The patient was identified properly. Informed consent was obtained and the surgical site was marked. The patient was taken to the OR where general anesthesia was induced.  The patient was positioned supine, with her leg on bone foam.  The left ankle was prepped and draped in the usual sterile fashion.  Timeout was performed before the beginning of the case.  Tourniquet was used for the above duration.  She received 2 g of Ancef prior to making incision  With the assistance of fluoroscopy, the fibula was outlined.  We then used a large fracture reduction clamp to improve the alignment of the distal fibula.  Next, we made a longitudinal incision, distal to the distal tip of the fibula.  We dissected bluntly to the distal tip of the fibula.  Under direct visualization of fluoroscopy, we used a K wire to find the correct starting point.  The K wire was advanced to the level of the fracture.  At this point, we are having difficulty passing the K wire across the fracture.  Next, we used the entry reamer to create a path so that we could use the finger fracture reduction tool.  The fracture reduction tool was then advanced across the fracture site, and we were able to confirm that the finger tool was within the proximal fragment, directly within the intramedullary canal.  We  then introduced a thin guidewire across the fracture.  Over top of this then guidewire, we reamed to a 3.2 mm canal.  We used fluoroscopy to confirm that we were directly within the intramedullary canal.  We then selected a 3.0 mm nail, with a length of 130 mm.  This was assembled on the back table.  We then used the entry  reamer to gain full access to the intramedullary canal.  Once the step was complete, the fibulock nail was introduced in retrograde fashion, across the fracture site, under direct visualization of fluoroscopy.  We confirmed that the nail was fully seated under fluoroscopy.  Next, we deployed the talons in the proximal portion of the nail, to secure it within the intramedullary canal.  We then used the outrigger to place 2 interlocking screws, distal to the fracture.  Her bone was of poor quality, and so we placed a cancellous locking screw initially.  The more proximal interlocking screw achieved better purchase.  We then completed orthogonal views under fluoroscopy, and confirmed placement of the intramedullary nail.  The ankle was then stressed under fluoroscopy, and the medial clear space had improved.  The mortise was congruent.  Nothing further was needed.  We irrigated the wound copiously.  We closed the incision with 3-0 nylon.  Sterile dressing was placed, followed by an Ace wrap.  Walking boots were then applied to bilateral legs.Patient was awoken taken to PACU in stable condition.   Post-operative plan:  The patient will be WBAT on the operative extremity Discharge home from the PACU once they have recovered DVT prophylaxis per primary team, no orthopedic contraindications.    Pain control with PRN pain medication preferring oral medicines.   Follow up plan will be scheduled in approximately 10-14 days days for incision check and XR.

## 2022-07-06 NOTE — Anesthesia Postprocedure Evaluation (Signed)
Anesthesia Post Note  Patient: Sheila [name]  Procedure(s) Performed: OPEN REDUCTION INTERNAL FIXATION (ORIF) ANKLE FRACTURE (Left: Ankle)  Patient location during evaluation: PACU Anesthesia Type: General Level of consciousness: awake and alert and oriented Pain management: pain level controlled Vital Signs Assessment: post-procedure vital signs reviewed and stable Respiratory status: spontaneous breathing, nonlabored ventilation, respiratory function stable and patient connected to nasal cannula oxygen Cardiovascular status: blood pressure returned to baseline and stable Postop Assessment: no apparent nausea or vomiting Anesthetic complications: no Comments: Post op wheezing, discussed with dr. Camillo Flaming, patient will be transferred to stepdown.  No notable events documented.   Last Vitals:  Vitals:   07/06/22 1845 07/06/22 1900  BP: 129/61 100/70  Pulse: 88 85  Resp: (!) 25 19  Temp:    SpO2: 99% 98%    Last Pain:  Vitals:   07/06/22 1900  TempSrc:   PainSc: 0-No pain                 Donta Mcinroy C Ashaun Gaughan

## 2022-07-06 NOTE — TOC Progression Note (Signed)
Transition of Care [facility]) - Progression Note    Patient Details  Name: Sheila [name] MRN: [id number] Date of Birth: [birth year]  Transition of Care [facility]) CM/SW Contact  Annice Needy, LCSW Phone Number: 07/06/2022, 2:06 PM  Clinical Narrative:    Bed offers provided to daughter, Stanton Kidney. Stanton Kidney wants to speak with sister, Olegario Messier, before making a decision. Authorization started today.    Expected Discharge Plan:  (Unknown at this time, PT will work will pt when more stable.) Barriers to Discharge: Continued Medical Work up  Expected Discharge Plan and Services In-house Referral: Clinical Social Work Discharge Planning Services: CM Consult   Living arrangements for the past 2 months: Single Family Home                                       Social Determinants of Health (SDOH) Interventions SDOH Screenings   Food Insecurity: No Food Insecurity (05/13/2022)  Housing: Low Risk  (05/13/2022)  Transportation Needs: No Transportation Needs (05/13/2022)  Utilities: Not At Risk (05/13/2022)  Alcohol Screen: Low Risk  (05/13/2022)  Depression (PHQ2-9): Low Risk  (05/13/2022)  Financial Resource Strain: Low Risk  (05/13/2022)  Physical Activity: Insufficiently Active (05/13/2022)  Social Connections: Socially Isolated (05/13/2022)  Stress: No Stress Concern Present (05/13/2022)  Tobacco Use: High Risk (07/03/2022)    Readmission Risk Interventions    07/01/2022   10:48 AM 06/27/2021   12:48 PM  Readmission Risk Prevention Plan  Transportation Screening Complete Complete  PCP or Specialist Appt within 3-5 Days  Not Complete  HRI or Home Care Consult Complete Complete  Social Work Consult for Recovery Care Planning/Counseling Complete Complete  Palliative Care Screening Not Applicable Not Applicable  Medication Review Oceanographer) Complete Complete

## 2022-07-06 NOTE — Progress Notes (Signed)
ORTHOPAEDIC PROGRESS NOTE  Scheduled for Procedure(s): Operative fixation of left ankle  DOS: 07/06/2022  SUBJECTIVE: No issues overnight.  All questions have been answered.  Patient has been n.p.o. all day.  OBJECTIVE: PE:  Alert and oriented.  No acute distress.  Left ankle with diffuse swelling and diffuse bruising.  Sensation is intact over the dorsum of the foot.  Tenderness to palpation laterally.  Vitals:   07/06/22 0926 07/06/22 1406  BP: 136/83 127/73  Pulse: 76 78  Resp:  19  Temp:  98.4 F (36.9 C)  SpO2: 94% 98%      Latest Ref Rng & Units 07/06/2022    4:50 AM 07/05/2022    4:53 AM 07/03/2022    5:10 AM  CBC  WBC 4.0 - 10.5 K/uL 9.1  8.9  8.7   Hemoglobin 12.0 - 15.0 g/dL 16.1  09.6  9.7   Hematocrit 36.0 - 46.0 % 32.0  31.2  30.0   Platelets 150 - 400 K/uL 269  260  158      ASSESSMENT: Sheila [name] is a 82 y.o. female stable, ready for surgery.  NPO since midnight.  PLAN: Weightbearing: NWB LLE; weightbearing as tolerated in the right lower extremity, while wearing a boot. Insicional and dressing care: Reinforce dressings as needed; none currently Orthopedic device(s): CAM boot bilaterally VTE prophylaxis: None currently, please hold until POD#1 Pain control: PRN medications, judicious use of narcotics Follow - up plan: 2 weeks postop   Contact information:     Dhiya Smits A. Dallas Schimke, MD MS [facility] [address] [place],  [place]  [address] Phone: [phone] Fax: [phone]

## 2022-07-06 NOTE — Progress Notes (Signed)
[facility] PROGRESS NOTE  Sheila [name] (DOB: Jul 25, 1940) ZHY:[id number] PCP: Junie Spencer, FNP  Brief Narrative: Sheila [name] is an 82 y.o. female with a history of COPD, HFrEF, chronic pain, anxiety, PUD who presented to the ED on 06/30/2022 due to worsening diffuse weakness culminating in a fall at home and subsequent inability to bear weight. EMS reported tachycardia and hypotension. Due to reports of change in urination and dysuria, ceftriaxone was given IM by EMS en route along with IV fluids. In the ED, T 97.5  BP 77/49 - to 95/59 after fluid bolus. HR 66 RR 19. Patient appeared ill, had bruised ankles that were tender. Code sepsis initiated in the field as above. IN ED she received 1 L IVF then LR at 150/hr. She receved Rocephin in the field and Vancomycin in ED. Lab: glucose 124, WBC 20.6 with 82/8/9 BUN 26, Cr 1.91 (baseline 24/1.3) lactic acid 1.9 to 1.7 after resuscitation. BNP 991 (baseline 54) U/A - cloudy, LE lrg, many bacteria/hpf, >50 wbc/hpf. X-ray revealed bilateral ankle fractures. [facility] called to admit for continued management of urosepsis and chronic disease mgt.  Foley catheter was inserted due to urinary retention, TOV planned 5/12. With antibiotics, sepsis physiology has resolved, leukocytosis resolved, and antibiotics have been narrowed to ancef per culture data. Cardiology was consulted, recommending no further testing prior to operative fixation of left ankle fracture which is planned 5/13. PT evaluations are ongoing.   Subjective: Lips are dry, has been NPO. No other new complaints. Voiding.  Objective: BP 136/83 (BP Location: Right Arm)   Pulse 76   Temp 99.1 F (37.3 C)   Resp 18   Ht 4\' 11"  (1.499 m)   Wt 109.9 kg   SpO2 94%   BMI 48.94 kg/m   Gen: Elderly obese female in no distress Pulm: Clear, nonlabored  CV: RRR, no MRG or pitting edema GI: Soft, NT, ND, +BS Neuro: Alert and oriented. No new focal deficits. Ext: Warm, no deformities. Boots in  place bilaterally. Skin: No new rashes, lesions or ulcers on visualized skin   Assessment & Plan: Principal Problem:   Sepsis secondary to UTI [facility]) Active Problems:   Prerenal azotemia   COPD (chronic obstructive pulmonary disease) (HCC)   Chronic systolic CHF (congestive heart failure) (HCC)   Closed left ankle fracture   Closed fracture of right distal fibula   PUD (peptic ulcer disease)   GAD (generalized anxiety disorder)   Essential hypertension, benign   Closed bimalleolar fracture of left ankle  Severe sepsis due to UTI: with organ dysfunction (AKI) - Based on urine culture susceptibilities, continue ancef. Leukocytosis resolved, remaining afebrile, blood cultures negative.  Right distal fibula fracture:  - Orthopedics evaluated, recommends NWB RLE, no surgery.  - Continue pain control as ordered with oxycodone (tolerated despite multiple allergies).    Left bimalleolar ankle fracture:  - WBAT in walking boot, anticipate operative fixation per orthopedics on 5/13. Holding heparin night before, made NPO p MN.  - Get PT evaluations. The patient was declining functionally at home, then fell, is now more impaired functionally than before. I will strongly suggest that she go to short term rehab postoperatively. Daughter amenable.   Chronic combined HFrEF, HTN: Last echo Feb 2024 showed LVEF 35-40% (down from Jan 2023 at 45-50%), G1DD, global hypokinesis, mildly reduced RV systolic function, mild enlargement, normal estimated PA pressure, RVSP 25.   - Restarted home entresto, spironolactone, torsemide. Appears stable. - She is at intermediate-to-high, but not  prohibitive, perioperative risk per cardiology who has made recommendations for the perioperative period. Appreciate their preoperative evaluation. No further testing recommended. Will remain on telemetry monitoring.  Normocytic anemia: Likely due to fractures and exaggerated by hemodilution. No gross bleeding currently. -  Continue monitoring with transfusion threshold of 7-8 g/dl depending on symptoms.   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 SCr elevated and rising. SCr baseline 1.3-1.4, was 1.9 > 2.2 > now back to baseline. - Renal U/S showed no hydro, though has had urinary retention, passed voiding trial 5/12, CrCl stable. - Continue holding nephrotoxins. - Continue home sensipar  COPD:  - Continue formulary equivalent controller BD and prn albuterol  Hyponatremia: Improved.  Hypokalemia:  - Supplement in MIVF (give while NPO) and monitor in AM.  GAD:  - Continue SSRI - prn alprazolam, hydroxyzine  PUD: No abd pain. With renal impairment, this contraindicates systemic NSAID use. - Continue PPI.   Hypothyroidism: Recent TSH 3.300.  - Continue synthroid.   HLD:  - Continue statin   Chronic neck pain:  - Robaxin prn - Topical camphor (takes this at home), could try voltaren topically if ineffective.   Dry skin:  - Topical Tx ordered. May also benefit from mobilizing OOB.  Loose stools: Chronic. No abd pain, remains with benign exam.  - Imodium prn is ok.  Morbid obesity: Body mass index is 48.94 kg/m.   Nocturnal hypoxia:  - Sleep study recommended as outpatient  Tyrone Nine, MD [facility] [email or web address] 07/06/2022, 10:54 AM

## 2022-07-06 NOTE — Anesthesia Procedure Notes (Signed)
Anesthesia Regional Block: Popliteal block   Pre-Anesthetic Checklist: , timeout performed,  Correct Patient, Correct Site, Correct Laterality,  Correct Procedure, Correct Position, site marked,  Risks and benefits discussed,  Surgical consent,  Pre-op evaluation,  At surgeon's request and post-op pain management  Laterality: Left  Prep: Betadine       Needles:  Injection technique: Single-shot  Needle Type: Echogenic Stimulator Needle     Needle Length: 10cm  Needle Gauge: 20   Needle insertion depth: 6 cm   Additional Needles:   Procedures:,,,, ultrasound used (permanent image in chart),,    Narrative:  Start time: 07/06/2022 4:05 PM End time: 07/06/2022 4:20 PM Injection made incrementally with aspirations every 5 mL.  Performed by: Personally  Anesthesiologist: Molli Barrows, MD  Additional Notes: BP cuff, EKG monitors applied. Sedation begun. After nerve location anesthetic injected incrementally, slowly , and after neg aspirations. Tolerated well.

## 2022-07-06 NOTE — Anesthesia Procedure Notes (Signed)
Procedure Name: Intubation Date/Time: 07/06/2022 5:08 PM  Performed by: Molli Barrows, MDPre-anesthesia Checklist: Patient identified, Emergency Drugs available, Suction available and Patient being monitored Patient Re-evaluated:Patient Re-evaluated prior to induction Oxygen Delivery Method: Circle system utilized Preoxygenation: Pre-oxygenation with 100% oxygen Induction Type: IV induction and Rapid sequence Grade View: Grade II Tube type: Oral Number of attempts: 1 Airway Equipment and Method: Stylet Placement Confirmation: ETT inserted through vocal cords under direct vision, positive ETCO2 and breath sounds checked- equal and bilateral Secured at: 21 cm Tube secured with: Tape Dental Injury: Teeth and Oropharynx as per pre-operative assessment

## 2022-07-06 NOTE — Evaluation (Signed)
Occupational Therapy Evaluation Patient Details Name: Sheila [name] MRN: [id number] DOB: Jun 08, 1940 Today's Date: 07/06/2022   History of Present Illness Mrs. Vock, an 82 y/o woman with HFrEF, chronic joint pain, anxiety, PUD, COPD by report has been ill for several days. Today she felt worse and weaker. She did suffer a fall landing on her ankles with subsequent pain. Due to her illness and ankle pain EMS activated: EMS called a CODE SEPSIS at 0941 en route to the hospital. EMS reports HR 127, EtCO2 32 (originally 19 upon their arrival), BP 68/38 (after NS bolus), temp 99.4. EMS reports foul odor to urine and frequent urination. Rocephin 1gm IM given by EMS at 0941. She was transported to [facility] for further evaluation.   Clinical Impression   Pt agreeable to OT evaluation. Pt donning B LE boots for session. Pt required mod A for bed mobility with slow labored movement. Sit to stand attempted twice with ability to achieve brief standing the second attempt with cuing for pt to maintain L LE weight bearing and keep hands on the RW. Pt noted to be generally weak in B UE with some A/ROM deficits of the shoulders. Pt requires ADL assist at baseline as she did today for lower body dressing. Pt was left in the bed with call bell within reach. Pt will benefit from continued OT in the hospital and recommended venue below to increase strength, balance, and endurance for safe ADL's.         Recommendations for follow up therapy are one component of a multi-disciplinary discharge planning process, led by the attending physician.  Recommendations may be updated based on patient status, additional functional criteria and insurance authorization.   Assistance Recommended at Discharge Intermittent Supervision/Assistance  Patient can return home with the following Two people to help with walking and/or transfers;A lot of help with bathing/dressing/bathroom;Assistance with cooking/housework;Assist for  transportation;Help with stairs or ramp for entrance    Functional Status Assessment  Patient has had a recent decline in their functional status and demonstrates the ability to make significant improvements in function in a reasonable and predictable amount of time.  Equipment Recommendations  None recommended by OT    Recommendations for Other Services       Precautions / Restrictions Precautions Precautions: Fall Restrictions Weight Bearing Restrictions: Yes RLE Weight Bearing: Weight bearing as tolerated LLE Weight Bearing: Non weight bearing      Mobility Bed Mobility Overal bed mobility: Needs Assistance Bed Mobility: Supine to Sit, Sit to Supine     Supine to sit: Mod assist, HOB elevated Sit to supine: Mod assist   General bed mobility comments: Labored effort and extended time. Assist to manage B LE during mobiilty.    Transfers Overall transfer level: Needs assistance Equipment used: Rolling walker (2 wheels) Transfers: Sit to/from Stand Sit to Stand: Max assist           General transfer comment: x2 attemtps with ability to stand on second try with RW. Cuing to maintain L LE weight bearing, which pt reported she did.      Balance Overall balance assessment: Needs assistance Sitting-balance support: Feet supported, Bilateral upper extremity supported Sitting balance-Leahy Scale: Fair Sitting balance - Comments: fair to good sitting balance on the edge of the bed.  fatigues quickly without back support   Standing balance support: Bilateral upper extremity supported, During functional activity, Reliant on assistive device for balance Standing balance-Leahy Scale: Poor Standing balance comment: using RW  ADL either performed or assessed with clinical judgement   ADL Overall ADL's : Needs assistance/impaired Eating/Feeding: Set up;Sitting   Grooming: Set up;Sitting   Upper Body Bathing: Set up;Minimal  assistance;Sitting   Lower Body Bathing: Maximal assistance;Sitting/lateral leans   Upper Body Dressing : Set up;Minimal assistance;Sitting   Lower Body Dressing: Maximal assistance;Bed level Lower Body Dressing Details (indicate cue type and reason): Max A to don R boot in bed. Toilet Transfer: Maximal assistance;Rolling walker (2 wheels) Toilet Transfer Details (indicate cue type and reason): Partially simulated via sit to stand from EOB. Toileting- Clothing Manipulation and Hygiene: Moderate assistance;Maximal assistance;Sitting/lateral lean               Vision Baseline Vision/History: 1 Wears glasses Ability to See in Adequate Light: 1 Impaired Patient Visual Report: No change from baseline Vision Assessment?: No apparent visual deficits                Pertinent Vitals/Pain Pain Assessment Pain Assessment: Faces Faces Pain Scale: Hurts little more Pain Location: B ankles Pain Descriptors / Indicators: Grimacing, Guarding Pain Intervention(s): Limited activity within patient's tolerance, Monitored during session, Repositioned     Hand Dominance Right   Extremity/Trunk Assessment Upper Extremity Assessment Upper Extremity Assessment: Generalized weakness;RUE deficits/detail;LUE deficits/detail RUE Deficits / Details: LImited to ~75% of available A/ROM for shoulder flexion. P/ROM limited the same. Generally weak otherwise. LUE Deficits / Details: LImited to ~75% of available A/ROM for shoulder flexion. P/ROM [facility] for shoulder flexion and abduction. Generally weak otherwise.   Lower Extremity Assessment Lower Extremity Assessment: Defer to PT evaluation   Cervical / Trunk Assessment Cervical / Trunk Assessment: Normal   Communication Communication Communication: No difficulties   Cognition Arousal/Alertness: Awake/alert Behavior During Therapy: WFL for tasks assessed/performed Overall Cognitive Status: Within Functional Limits for tasks assessed                                        General Comments  Pt reported feeling dizzy when returning to supine in bed.               Home Living Family/patient expects to be discharged to:: Private residence Living Arrangements: Children Available Help at Discharge: Family;Personal care attendant;Available 24 hours/day Type of Home: Apartment Home Access: Ramped entrance     Home Layout: One level     Bathroom Shower/Tub: Chief Strategy Officer: Standard Bathroom Accessibility: Yes   Home Equipment: Rollator (4 wheels);Shower seat;Wheelchair - manual;Hand held shower head;Grab bars - tub/shower   Additional Comments: uses rollator at baseline      Prior Functioning/Environment Prior Level of Function : Needs assist             Mobility Comments: household ambulator with rollator ADLs Comments: Assist needed for bathing, dressing, and toileting.        OT Problem List: Decreased strength;Decreased range of motion;Decreased activity tolerance;Impaired balance (sitting and/or standing);Obesity      OT Treatment/Interventions: Self-care/ADL training;Therapeutic exercise;Therapeutic activities;Patient/family education;Balance training    OT Goals(Current goals can be found in the care plan section) Acute Rehab OT Goals Patient Stated Goal: return home OT Goal Formulation: With patient Time For Goal Achievement: 07/20/22 Potential to Achieve Goals: Fair  OT Frequency: Min 2X/week  End of Session Equipment Utilized During Treatment: Rolling walker (2 wheels);Gait belt;Other (comment) (bilateral boots)  Activity Tolerance: Patient tolerated treatment well Patient left: in bed;with call bell/phone within reach;with bed alarm set  OT Visit Diagnosis: Unsteadiness on feet (R26.81);Other abnormalities of gait and mobility (R26.89);Muscle weakness (generalized) (M62.81);History of falling (Z91.81)                 Time: 1610-9604 OT Time Calculation (min): 26 min Charges:  OT General Charges $OT Visit: 1 Visit OT Evaluation $OT Eval Low Complexity: 1 Low  Maddeline Roorda OT, MOT  Danie Chandler 07/06/2022, 9:20 AM

## 2022-07-06 NOTE — Plan of Care (Signed)
  Problem: Acute Rehab OT Goals (only OT should resolve) Goal: Pt. Will Perform Grooming Flowsheets (Taken 07/06/2022 0923) Pt Will Perform Grooming:  with modified independence  sitting Goal: Pt. Will Perform Upper Body Bathing Flowsheets (Taken 07/06/2022 0923) Pt Will Perform Upper Body Bathing:  with modified independence  sitting Goal: Pt. Will Perform Upper Body Dressing Flowsheets (Taken 07/06/2022 0923) Pt Will Perform Upper Body Dressing:  with min assist  with adaptive equipment  sitting Goal: Pt. Will Perform Lower Body Dressing Flowsheets (Taken 07/06/2022 0923) Pt Will Perform Lower Body Dressing:  with min assist  with adaptive equipment  sitting/lateral leans Goal: Pt. Will Transfer To Toilet Flowsheets (Taken 07/06/2022 [phone]) Pt Will Transfer to Toilet:  with mod assist  stand pivot transfer  with min assist Goal: Pt/Caregiver Will Perform Home Exercise Program Flowsheets (Taken 07/06/2022 [phone]) Pt/caregiver will Perform Home Exercise Program:  Increased ROM  Increased strength  Both right and left upper extremity  Independently  Mayana Irigoyen OT, MOT

## 2022-07-06 NOTE — Transfer of Care (Signed)
Immediate Anesthesia Transfer of Care Note  Patient: Sheila [name]  Procedure(s) Performed: OPEN REDUCTION INTERNAL FIXATION (ORIF) ANKLE FRACTURE (Left: Ankle)  Patient Location: PACU  Anesthesia Type:General  Level of Consciousness: awake, alert , oriented, sedated, and patient cooperative  Airway & Oxygen Therapy: Patient Spontanous Breathing and Patient connected to nasal cannula oxygen  Post-op Assessment: Report given to RN and Post -op Vital signs reviewed and stable  Post vital signs: Reviewed and stable  Last Vitals:  Vitals Value Taken Time  BP 110/93 07/06/22 1840  Temp 98.5 07/06/22   1830  Pulse 89 07/06/22 1846  Resp 24 07/06/22 1846  SpO2 100 % 07/06/22 1846  Vitals shown include unvalidated device data.  Last Pain:  Vitals:   07/06/22 0800  TempSrc:   PainSc: Asleep      Patients Stated Pain Goal: 0 (07/05/22 0231)  Complications: post op wheezing, duoneb nebulizer treatment was given.

## 2022-07-06 NOTE — Progress Notes (Signed)
PO meds not given d/t NPO status for surgery today with Dr. Dallas Schimke.

## 2022-07-06 NOTE — Progress Notes (Signed)
eLink Physician-Brief Progress Note Patient Name: Sheila [name] DOB: 1941/01/01 MRN: [id number]   Date of Service  07/06/2022  HPI/Events of Note  82 year old female with a history of chronic obstructive pulmonary disease, heart failure with reduced ejection fraction, chronic pain and anxiety, peptic ulcer disease who initially presented to the emergency department on 5/7 with worsening diffuse weakness complicated by a fall and inability to bear weight.  She was found to have severe sepsis secondary to urinary tract infection, right distal fibular fracture, knee fracture, and on 5/13 underwent operative fixation of the left distal fibula with an intramedullary nail.  She returned to the ICU postoperatively  She was given perioperative cefazolin.  Returns to the ICU after she had increased work of breathing in the PACU, received DuoNeb, steroids, and returned with normal vitals.  She is not currently on any vasopressors, continuous IV infusions, and VBG is within normal limits.  Metabolic panel consistent with mild hyperglycemia, elevated creatinine, and hemoglobin of 10.5.  Postoperative films within normal limits.  Chest radiograph reviewed and unremarkable.   eICU Interventions  She is also on 1 for her urinary tract infection.  Continuing her goal-directed medical therapy for her heart failure with Entresto, Actamin, torsemide, metoprolol.  Unclear why she is on high-dose steroids, but this could contribute to worsening PUD, delirium,  and impaired tissue healing -will defer management to primary team as indication is unclear.  DNR reviewed.  Currently no indication for GI prophylaxis although she is on pantoprazole.  DVT prophylaxis to resume per orthopedic surgery.        [name] 07/06/2022, 10:09 PM

## 2022-07-07 DIAGNOSIS — S82892A Other fracture of left lower leg, initial encounter for closed fracture: Secondary | ICD-10-CM

## 2022-07-07 DIAGNOSIS — S82831A Other fracture of upper and lower end of right fibula, initial encounter for closed fracture: Secondary | ICD-10-CM | POA: Diagnosis not present

## 2022-07-07 DIAGNOSIS — A419 Sepsis, unspecified organism: Secondary | ICD-10-CM | POA: Diagnosis not present

## 2022-07-07 DIAGNOSIS — I5022 Chronic systolic (congestive) heart failure: Secondary | ICD-10-CM | POA: Diagnosis not present

## 2022-07-07 DIAGNOSIS — N390 Urinary tract infection, site not specified: Secondary | ICD-10-CM | POA: Diagnosis not present

## 2022-07-07 LAB — CBC
HCT: 32.3 % — ABNORMAL LOW (ref 36.0–46.0)
Hemoglobin: 10.7 g/dL — ABNORMAL LOW (ref 12.0–15.0)
MCH: 28.5 pg (ref 26.0–34.0)
MCHC: 33.1 g/dL (ref 30.0–36.0)
MCV: 85.9 fL (ref 80.0–100.0)
Platelets: 296 10*3/uL (ref 150–400)
RBC: 3.76 MIL/uL — ABNORMAL LOW (ref 3.87–5.11)
RDW: 14.9 % (ref 11.5–15.5)
WBC: 10.4 10*3/uL (ref 4.0–10.5)
nRBC: 0 % (ref 0.0–0.2)

## 2022-07-07 LAB — BASIC METABOLIC PANEL
Anion gap: 12 (ref 5–15)
BUN: 28 mg/dL — ABNORMAL HIGH (ref 8–23)
CO2: 23 mmol/L (ref 22–32)
Calcium: 8.1 mg/dL — ABNORMAL LOW (ref 8.9–10.3)
Chloride: 101 mmol/L (ref 98–111)
Creatinine, Ser: 1.37 mg/dL — ABNORMAL HIGH (ref 0.44–1.00)
GFR, Estimated: 39 mL/min — ABNORMAL LOW (ref >60)
Glucose, Bld: 191 mg/dL — ABNORMAL HIGH (ref 70–99)
Potassium: 4.1 mmol/L (ref 3.5–5.1)
Sodium: 136 mmol/L (ref 135–145)

## 2022-07-07 LAB — GLUCOSE, CAPILLARY: Glucose-Capillary: 110 mg/dL — ABNORMAL HIGH (ref 70–99)

## 2022-07-07 MED ORDER — SACUBITRIL-VALSARTAN 97-103 MG PO TABS
1.0000 | ORAL_TABLET | Freq: Two times a day (BID) | ORAL | Status: DC
  Administered 2022-07-07 – 2022-07-09 (×4): 1 via ORAL
  Filled 2022-07-07 (×6): qty 1

## 2022-07-07 MED ORDER — SPIRONOLACTONE 25 MG PO TABS
25.0000 mg | ORAL_TABLET | Freq: Every day | ORAL | Status: DC
  Administered 2022-07-08 – 2022-07-09 (×2): 25 mg via ORAL
  Filled 2022-07-07 (×2): qty 1

## 2022-07-07 MED ORDER — HEPARIN SODIUM (PORCINE) 5000 UNIT/ML IJ SOLN
5000.0000 [IU] | Freq: Three times a day (TID) | INTRAMUSCULAR | Status: DC
  Administered 2022-07-07 – 2022-07-09 (×5): 5000 [IU] via SUBCUTANEOUS
  Filled 2022-07-07 (×5): qty 1

## 2022-07-07 MED ORDER — ACETAMINOPHEN 325 MG PO TABS
650.0000 mg | ORAL_TABLET | ORAL | Status: DC | PRN
  Administered 2022-07-07: 650 mg via ORAL
  Filled 2022-07-07: qty 2

## 2022-07-07 NOTE — TOC Progression Note (Signed)
Transition of Care [facility]) - Progression Note    Patient Details  Name: Sheila [name] MRN: [id number] Date of Birth: [birth year]  [facility] of [facility]) CM/SW Contact  Elliot Gault, LCSW Phone Number: 07/07/2022, 1:13 PM  Clinical Narrative:     TOC following. Met with pt and daughters at bedside to follow up on SNF bed offers. Pt and family select [facility]. MD anticipating dc in 1-2 days.  Administrator, sports at [facility]. Updated insurance of SNF choice and also sent requested additional clinical.  Will follow.  Expected Discharge Plan:  (Unknown at this time, PT will work will pt when more stable.) Barriers to Discharge: Continued Medical Work up  Expected Discharge Plan and Services In-house Referral: Clinical Social Work Discharge Planning Services: CM Consult   Living arrangements for the past 2 months: Single Family Home                                       Social Determinants of Health (SDOH) Interventions SDOH Screenings   Food Insecurity: No Food Insecurity (05/13/2022)  Housing: Low Risk  (05/13/2022)  Transportation Needs: No Transportation Needs (05/13/2022)  Utilities: Not At Risk (05/13/2022)  Alcohol Screen: Low Risk  (05/13/2022)  Depression (PHQ2-9): Low Risk  (05/13/2022)  Financial Resource Strain: Low Risk  (05/13/2022)  Physical Activity: Insufficiently Active (05/13/2022)  Social Connections: Socially Isolated (05/13/2022)  Stress: No Stress Concern Present (05/13/2022)  Tobacco Use: High Risk (07/06/2022)    Readmission Risk Interventions    07/01/2022   10:48 AM 06/27/2021   12:48 PM  Readmission Risk Prevention Plan  Transportation Screening Complete Complete  PCP or Specialist Appt within 3-5 Days  Not Complete  HRI or Home Care Consult Complete Complete  Social Work Consult for Recovery Care Planning/Counseling Complete Complete  Palliative Care Screening Not Applicable Not Applicable  Medication Review Oceanographer) Complete  Complete

## 2022-07-07 NOTE — Progress Notes (Signed)
[facility] PROGRESS NOTE  Sheila [name] (DOB: April 13, 1940) ZOX:[id number] PCP: Junie Spencer, FNP  Brief Narrative: Sheila [name] is an 82 y.o. female with a history of COPD, HFrEF, chronic pain, anxiety, PUD who presented to the ED on 06/30/2022 due to worsening diffuse weakness culminating in a fall at home and subsequent inability to bear weight. EMS reported tachycardia and hypotension. Due to reports of change in urination and dysuria, ceftriaxone was given IM by EMS en route along with IV fluids. In the ED, T 97.5  BP 77/49 - to 95/59 after fluid bolus. HR 66 RR 19. Patient appeared ill, had bruised ankles that were tender. Code sepsis initiated in the field as above. IN ED she received 1 L IVF then LR at 150/hr. She receved Rocephin in the field and Vancomycin in ED. Lab: glucose 124, WBC 20.6 with 82/8/9 BUN 26, Cr 1.91 (baseline 24/1.3) lactic acid 1.9 to 1.7 after resuscitation. BNP 991 (baseline 54) U/A - cloudy, LE lrg, many bacteria/hpf, >50 wbc/hpf. X-ray revealed bilateral ankle fractures. TR[facility] called to admit for continued management of urosepsis and chronic disease mgt.  Foley catheter was inserted due to urinary retention, TOV planned 5/12. With antibiotics, sepsis physiology has resolved, leukocytosis resolved, and antibiotics have been narrowed to ancef per culture data. Cardiology was consulted, recommending no further testing prior to operative fixation of left ankle fracture which was performed 5/13. Went to SDU postoperatively due to respiratory distress which seems to have resolved.   PT evaluations postoperatively are ongoing, SNF disposition is anticipated.   Subjective: Feels fine this morning, was caught with smokeless tobacco. Having no wheezing or dyspnea this morning. Wants to eat. No chest pain. Pain in legs is controlled, "I'm not in any pain at all."   Objective: BP 116/79   Pulse 97   Temp 98.5 F (36.9 C) (Oral)   Resp 14   Ht 4\' 11"  (1.499 m)   Wt 108.6  kg   SpO2 95%   BMI 48.36 kg/m   Gen: Obese, elderly well-appearing female in no distress Pulm: No wheezes or crackles, nonlabored breathing  CV: RRR, no MRG, no pitting edema. GI: Soft, NT, ND, +BS  Neuro: Alert and oriented. No new focal deficits. Ext: Warm, dry. CAM boots bilaterally with sensation and motor function intact in toes and proximally. Skin: Surgical dressing is c/d/I. No other/new rashes, lesions or ulcers on visualized skin   Assessment & Plan: Principal Problem:   Sepsis secondary to UTI [facility]) Active Problems:   Prerenal azotemia   COPD (chronic obstructive pulmonary disease) (HCC)   Chronic systolic CHF (congestive heart failure) (HCC)   Closed left ankle fracture   Closed fracture of right distal fibula   PUD (peptic ulcer disease)   GAD (generalized anxiety disorder)   Essential hypertension, benign   Closed bimalleolar fracture of left ankle  Severe sepsis due to UTI: with organ dysfunction (AKI) - Based on urine culture susceptibilities, continue ancef. Leukocytosis resolved, remaining afebrile, blood cultures negative.  Right distal fibula fracture:  - Orthopedics evaluated, recommends WBAT RLE in CAM boot, no surgery.  - Continue pain control as ordered with oxycodone (tolerated despite multiple allergies).    Left bimalleolar ankle fracture: s/p operative fixation of left distal fibula fracture with fibulock, IM nail 5/13 by Dr. Dallas Schimke.  - WBAT in CAM boot. Will restart heparin VTE ppx this PM >24 hours postoperative on POD #1. - Get PT evaluations. The patient was declining functionally at home,  then fell, is now more impaired functionally than before. I will strongly suggest that she go to short term rehab postoperatively. Daughter amenable.   Respiratory distress: Postoperatively, now resolved. VBG reassuring. Remaining in SDU for close monitoring.  Chronic combined HFrEF, HTN: Last echo Feb 2024 showed LVEF 35-40% (down from Jan 2023 at 45-50%),  G1DD, global hypokinesis, mildly reduced RV systolic function, mild enlargement, normal estimated PA pressure, RVSP 25.   - Continue torsemide.  - BP soft, so will hold spironolactone today and AM dose of entresto today. Orders to resume otherwise are in.  Normocytic anemia: Likely due to fractures and exaggerated by hemodilution. No gross bleeding currently. - Continue monitoring. Postoperatively has stable hgb.   AKI on stage IIIb CKD: Was given sepsis IVF then IV diuretic. SCr elevated and rising. SCr baseline 1.3-1.4, was 1.9 > 2.2 > now back to baseline. Cr 1.37 postoperatively.  - Recheck BMP in AM, holding ARNI and MRA as above today, continue torsemide given respiratory issues postoperatively to avoid further deterioration.  - Monitor urine output, bladder scan prn. Had urinary retention earlier in hospitalization (passed voiding trial 5/12) - Continue holding nephrotoxins. - Continue home sensipar  COPD:  - Continue formulary equivalent controller BD and prn albuterol - No wheezing on exam this morning. Was given high dose steroids overnight which we will hold for the time being.   Hyponatremia: Improved.  Hypokalemia: Resolved.  - Monitor in AM.  GAD:  - Continue SSRI - prn alprazolam, hydroxyzine  PUD: No abd pain. With renal impairment, this contraindicates systemic NSAID use. - Continue PPI.   Hypothyroidism: Recent TSH 3.300.  - Continue synthroid.   HLD:  - Continue statin   Chronic neck pain:  - Robaxin prn - Topical camphor (takes this at home), could try voltaren topically if ineffective.   Dry skin:  - Topical Tx ordered. May also benefit from mobilizing OOB.  Loose stools: Chronic. No abd pain, remains with benign exam.  - Imodium prn is ok.  Morbid obesity: Body mass index is 48.36 kg/m.   Nocturnal hypoxia:  - Sleep study recommended as outpatient  Tyrone Nine, MD [facility] [email or web address] 07/07/2022, 9:36 AM

## 2022-07-07 NOTE — Progress Notes (Signed)
ORTHOPAEDIC PROGRESS NOTE  Status post: Operative fixation of left ankle  DOS: 07/06/2022  SUBJECTIVE: Transferred to ICU over night due to issues with her breathing.  She is comfortable this morning.  She denies pain.  No numbness or tingling.  OBJECTIVE: PE:  Alert and oriented.  No acute distress.  Left ankle dressing is clean, dry and intact Toes are WWP She is able to wiggle her toes Sensation intact to exposed toes CAM boot in place  Right ankle in boot Toes are WWP Able to wiggle exposed toes Sensation intact   Vitals:   07/07/22 0700 07/07/22 0831  BP: (!) 108/42   Pulse: 64   Resp: (!) 23   Temp:  98.5 F (36.9 C)  SpO2: 97%       Latest Ref Rng & Units 07/07/2022    4:51 AM 07/06/2022    4:50 AM 07/05/2022    4:53 AM  CBC  WBC 4.0 - 10.5 K/uL 10.4  9.1  8.9   Hemoglobin 12.0 - 15.0 g/dL 16.1  09.6  04.5   Hematocrit 36.0 - 46.0 % 32.3  32.0  31.2   Platelets 150 - 400 K/uL 296  269  260      ASSESSMENT: Sheila [name] is a 82 y.o. female stable this morning.  Graham in place.  No issues with conversing.   PLAN: Weightbearing: WBAT BLE in walking boots Insicional and dressing care: Reinforce dressings as needed Orthopedic device(s): CAM boot bilaterally VTE prophylaxis: None currently, please hold until POD#1 Pain control: PRN medications, judicious use of narcotics Follow - up plan: 2 weeks postop  PT/OT Dispo pending   Contact information:     Alexcis Bicking A. Dallas Schimke, MD MS [facility] [address] [place],  [place]  [address] Phone: [phone] Fax: [phone]

## 2022-07-07 NOTE — Evaluation (Signed)
Occupational Therapy Evaluation Patient Details Name: Sheila [name] MRN: [id number] DOB: 03-06-1940 Today's Date: 07/07/2022   History of Present Illness Sheila [name], an 82 y/o woman with HFrEF, chronic joint pain, anxiety, PUD, COPD by report has been ill for several days. Today she felt worse and weaker. She did suffer a fall landing on her ankles with subsequent pain. Due to her illness and ankle pain EMS activated: EMS called a CODE SEPSIS at 0941 en route to the hospital. EMS reports HR 127, EtCO2 32 (originally 19 upon their arrival), BP 68/38 (after NS bolus), temp 99.4. EMS reports foul odor to urine and frequent urination. Rocephin 1gm IM given by EMS at 0941. She was transported to [facility] for further evaluation.   Clinical Impression   Pt agreeable to OT and PT evaluation post L ankle surgery. Pt re-evaluated today due to being moved to ICU with a new order placed. Pt demonstrates improved mobility with min A mostly for extended time and HOB elevation. Pt also improved transfers to mod A with ability to weight bear through bilateral ankles today. Pt was removed from supplemental O2 during the session and remained at 90% or above for most of the session. Brief cuing for pursed lip breathing needed. Will continue with the same goals in previous plan of care. Pt will benefit from continued OT in the hospital and recommended venue below to increase strength, balance, and endurance for safe ADL's.       Recommendations for follow up therapy are one component of a multi-disciplinary discharge planning process, led by the attending physician.  Recommendations may be updated based on patient status, additional functional criteria and insurance authorization.   Assistance Recommended at Discharge Intermittent Supervision/Assistance  Patient can return home with the following A lot of help with bathing/dressing/bathroom;Assistance with cooking/housework;Assist for transportation;Help with stairs or  ramp for entrance;A lot of help with walking and/or transfers    Functional Status Assessment  Patient has had a recent decline in their functional status and demonstrates the ability to make significant improvements in function in a reasonable and predictable amount of time.  Equipment Recommendations  None recommended by OT    Recommendations for Other Services       Precautions / Restrictions Precautions Precautions: Fall Restrictions Weight Bearing Restrictions: Yes RLE Weight Bearing: Weight bearing as tolerated LLE Weight Bearing: Weight bearing as tolerated      Mobility Bed Mobility Overal bed mobility: Needs Assistance Bed Mobility: Supine to Sit     Supine to sit: Min assist, HOB elevated     General bed mobility comments: Elevated head of bed; extended time.    Transfers Overall transfer level: Needs assistance Equipment used: Rolling walker (2 wheels) Transfers: Sit to/from Stand, Bed to chair/wheelchair/BSC Sit to Stand: Mod assist     Step pivot transfers: Mod assist     General transfer comment: Labored effort and extended time with use of RW. EOB to [facility] and BSC to chair.      Balance Overall balance assessment: Needs assistance Sitting-balance support: Feet supported, Bilateral upper extremity supported Sitting balance-Leahy Scale: Fair Sitting balance - Comments: fair to good sitting balance on the edge of the bed.  fatigues quickly without back support   Standing balance support: Bilateral upper extremity supported, During functional activity, Reliant on assistive device for balance Standing balance-Leahy Scale: Poor Standing balance comment: poor to fair with RW  ADL either performed or assessed with clinical judgement   ADL Overall ADL's : Needs assistance/impaired Eating/Feeding: Set up;Sitting   Grooming: Set up;Sitting   Upper Body Bathing: Set up;Minimal assistance;Sitting   Lower Body  Bathing: Maximal assistance;Sitting/lateral leans   Upper Body Dressing : Set up;Minimal assistance;Sitting   Lower Body Dressing: Maximal assistance;Bed level   Toilet Transfer: Rolling walker (2 wheels);Moderate assistance;Stand-pivot Toilet Transfer Details (indicate cue type and reason): EOB to [facility] Toileting- Clothing Manipulation and Hygiene: Moderate assistance;Sitting/lateral lean;Maximal assistance;Bed level       Functional mobility during ADLs: Moderate assistance;Rolling walker (2 wheels)       Vision Baseline Vision/History: 1 Wears glasses Ability to See in Adequate Light: 1 Impaired Patient Visual Report: No change from baseline Vision Assessment?: No apparent visual deficits                Pertinent Vitals/Pain Pain Assessment Pain Assessment: No/denies pain     Hand Dominance Right   Extremity/Trunk Assessment Upper Extremity Assessment Upper Extremity Assessment: Generalized weakness;RUE deficits/detail;LUE deficits/detail RUE Deficits / Details: LImited to ~ 50 to 75% of available A/ROM for shoulder flexion. Shoulder issues at baseline. LUE Deficits / Details: LImited to ~75% of available A/ROM for shoulder flexion. Generally weak otherwise.   Lower Extremity Assessment Lower Extremity Assessment: Defer to PT evaluation   Cervical / Trunk Assessment Cervical / Trunk Assessment: Normal   Communication Communication Communication: No difficulties   Cognition Arousal/Alertness: Awake/alert Behavior During Therapy: WFL for tasks assessed/performed Overall Cognitive Status: Within Functional Limits for tasks assessed                                                        Home Living Family/patient expects to be discharged to:: Private residence Living Arrangements: Children Available Help at Discharge: Family;Personal care attendant;Available 24 hours/day Type of Home: Apartment Home Access: Ramped entrance     Home  Layout: One level     Bathroom Shower/Tub: Chief Strategy Officer: Standard Bathroom Accessibility: Yes   Home Equipment: Rollator (4 wheels);Shower seat;Wheelchair - manual;Hand held shower head;Grab bars - tub/shower   Additional Comments: uses rollator at baseline      Prior Functioning/Environment Prior Level of Function : Needs assist             Mobility Comments: household ambulator with rollator ADLs Comments: Assist needed for bathing, dressing, and toileting.        OT Problem List: Decreased strength;Decreased range of motion;Decreased activity tolerance;Impaired balance (sitting and/or standing);Obesity      OT Treatment/Interventions: Self-care/ADL training;Therapeutic exercise;Therapeutic activities;Patient/family education;Balance training    OT Goals(Current goals can be found in the care plan section) Acute Rehab OT Goals Patient Stated Goal: return home OT Goal Formulation: With patient Time For Goal Achievement:  Potential to Achieve Goals: Fair  OT Frequency: Min 2X/week    Co-evaluation PT/OT/SLP Co-Evaluation/Treatment: Yes Reason for Co-Treatment: To address functional/ADL transfers   OT goals addressed during session: ADL's and self-care                       End of Session Equipment Utilized During Treatment: Rolling walker (2 wheels) Nurse Communication: Mobility status  Activity Tolerance: Patient tolerated treatment well Patient left: in chair;with call bell/phone within reach  OT Visit Diagnosis: Unsteadiness  on feet (R26.81);Other abnormalities of gait and mobility (R26.89);Muscle weakness (generalized) (M62.81);History of falling (Z91.81)                Time: 1610-9604 OT Time Calculation (min): 15 min Charges:  OT General Charges $OT Visit: 1 Visit OT Evaluation $OT Eval Low Complexity: 1 Low  Brindley Madarang OT, MOT  Danie Chandler 07/07/2022, 9:16 AM

## 2022-07-07 NOTE — Evaluation (Signed)
Physical Therapy Evaluation Patient Details Name: Sheila [name] MRN: [id number] DOB: 21-Apr-1940 Today's Date: 07/07/2022  History of Present Illness  Sheila [name], an 82 y/o woman s/p Operative fixation of left distal fibula fracture with a fibulock, intramedullary nail on 07/06/22, with HFrEF, chronic joint pain, anxiety, PUD, COPD by report has been ill for several days. Today she felt worse and weaker. She did suffer a fall landing on her ankles with subsequent pain. Due to her illness and ankle pain EMS activated: EMS called a CODE SEPSIS at 0941 en route to the hospital. EMS reports HR 127, EtCO2 32 (originally 19 upon their arrival), BP 68/38 (after NS bolus), temp 99.4. EMS reports foul odor to urine and frequent urination. Rocephin 1gm IM given by EMS at 0941. She was transported to [facility] for further evaluation.   Clinical Impression  Patient demonstrates labored movement for sitting up at bedside, tolerated standing on bilateral feet without c/o pain and limited to a few steps at bedside before having to sit due to c/o fatigue and generalized weakness.  Patient able to transfer to [facility] and later tolerated sitting up in chair after therapy - nursing staff notified.  Patient will benefit from continued skilled physical therapy in hospital and recommended venue below to increase strength, balance, endurance for safe ADLs and gait.        Recommendations for follow up therapy are one component of a multi-disciplinary discharge planning process, led by the attending physician.  Recommendations may be updated based on patient status, additional functional criteria and insurance authorization.  Follow Up Recommendations Can patient physically be transported by private vehicle: No     Assistance Recommended at Discharge Set up Supervision/Assistance  Patient can return home with the following  A lot of help with bathing/dressing/bathroom;A lot of help with walking and/or transfers;Help with  stairs or ramp for entrance;Assistance with cooking/housework    Equipment Recommendations None recommended by PT  Recommendations for Other Services       Functional Status Assessment Patient has had a recent decline in their functional status and demonstrates the ability to make significant improvements in function in a reasonable and predictable amount of time.     Precautions / Restrictions Precautions Precautions: Fall Restrictions Weight Bearing Restrictions: Yes RLE Weight Bearing: Weight bearing as tolerated LLE Weight Bearing: Weight bearing as tolerated      Mobility  Bed Mobility Overal bed mobility: Needs Assistance Bed Mobility: Supine to Sit     Supine to sit: Min assist, HOB elevated     General bed mobility comments: increased time, labored movement    Transfers Overall transfer level: Needs assistance Equipment used: Rolling walker (2 wheels) Transfers: Sit to/from Stand, Bed to chair/wheelchair/BSC Sit to Stand: Mod assist   Step pivot transfers: Mod assist       General transfer comment: slow labored movement , fatigues easily    Ambulation/Gait Ambulation/Gait assistance: Mod assist Gait Distance (Feet): 5 Feet Assistive device: Rolling walker (2 wheels) Gait Pattern/deviations: Decreased step length - right, Decreased step length - left, Decreased stride length, Trunk flexed Gait velocity: slow     General Gait Details: limited to a few slow labored side steps at bedside before having to sit due to c/o fatigue  Stairs            Wheelchair Mobility    Modified Rankin (Stroke Patients Only)       Balance Overall balance assessment: Needs assistance Sitting-balance support: Feet supported, No upper  extremity supported Sitting balance-Leahy Scale: Fair Sitting balance - Comments: fair/good seated at EOB   Standing balance support: Reliant on assistive device for balance, During functional activity, Bilateral upper extremity  supported Standing balance-Leahy Scale: Poor Standing balance comment: fair/poor using RW                             Pertinent Vitals/Pain Pain Assessment Pain Assessment: No/denies pain    Home Living Family/patient expects to be discharged to:: Private residence Living Arrangements: Children Available Help at Discharge: Family;Personal care attendant;Available 24 hours/day Type of Home: Apartment Home Access: Ramped entrance       Home Layout: One level Home Equipment: Rollator (4 wheels);Shower seat;Wheelchair - manual;Hand held shower head;Grab bars - tub/shower Additional Comments: uses rollator at baseline    Prior Function Prior Level of Function : Needs assist       Physical Assist : Mobility (physical);ADLs (physical) Mobility (physical): Bed mobility;Transfers;Gait;Stairs   Mobility Comments: household ambulator with rollator ADLs Comments: Assist needed for bathing, dressing, and toileting.     Hand Dominance   Dominant Hand: Right    Extremity/Trunk Assessment   Upper Extremity Assessment Upper Extremity Assessment: Defer to OT evaluation RUE Deficits / Details: LImited to ~ 50 to 75% of available A/ROM for shoulder flexion. Shoulder issues at baseline. LUE Deficits / Details: LImited to ~75% of available A/ROM for shoulder flexion. Generally weak otherwise.    Lower Extremity Assessment Lower Extremity Assessment: Generalized weakness;RLE deficits/detail RLE Deficits / Details: grossly 3+/5 RLE: Unable to fully assess due to immobilization (wearing CAM boots BLE) RLE Sensation: WNL RLE Coordination: WNL    Cervical / Trunk Assessment Cervical / Trunk Assessment: Normal  Communication   Communication: No difficulties  Cognition Arousal/Alertness: Awake/alert Behavior During Therapy: WFL for tasks assessed/performed Overall Cognitive Status: Within Functional Limits for tasks assessed                                           General Comments      Exercises     Assessment/Plan    PT Assessment Patient needs continued PT services  PT Problem List Decreased strength;Decreased activity tolerance;Decreased balance;Decreased mobility       PT Treatment Interventions DME instruction;Balance training;Gait training;Neuromuscular re-education;Functional mobility training;Patient/family education;Therapeutic activities;Therapeutic exercise    PT Goals (Current goals can be found in the Care Plan section)  Acute Rehab PT Goals Patient Stated Goal: return home after rehab PT Goal Formulation: With patient Time For Goal Achievement: 07/18/22 Potential to Achieve Goals: Good    Frequency Min 3X/week     Co-evaluation PT/OT/SLP Co-Evaluation/Treatment: Yes Reason for Co-Treatment: To address functional/ADL transfers PT goals addressed during session: Mobility/safety with mobility;Balance;Proper use of DME OT goals addressed during session: ADL's and self-care       AM-PAC PT "6 Clicks" Mobility  Outcome Measure Help needed turning from your back to your side while in a flat bed without using bedrails?: A Little Help needed moving from lying on your back to sitting on the side of a flat bed without using bedrails?: A Little Help needed moving to and from a bed to a chair (including a wheelchair)?: A Lot Help needed standing up from a chair using your arms (e.g., wheelchair or bedside chair)?: A Lot Help needed to walk in hospital room?: A Lot Help  needed climbing 3-5 steps with a railing? : A Lot 6 Click Score: 14    End of Session   Activity Tolerance: Patient tolerated treatment well;Patient limited by fatigue Patient left: in chair;with call bell/phone within reach Nurse Communication: Mobility status PT Visit Diagnosis: Other abnormalities of gait and mobility (R26.89);Muscle weakness (generalized) (M62.81);History of falling (Z91.81);Unsteadiness on feet (R26.81)    Time: 1914-7829 PT  Time Calculation (min) (ACUTE ONLY): 20 min   Charges:   PT Evaluation $PT Eval Moderate Complexity: 1 Mod PT Treatments $Therapeutic Activity: 8-22 mins        12:35 PM, 07/07/22 Ocie Bob, MPT Physical Therapist with [facility] 336 [phone] office [phone] mobile phone

## 2022-07-07 NOTE — Progress Notes (Signed)
   07/07/22 1617  Spiritual Encounters  Type of Visit Initial  Care provided to: Patient  Conversation partners present during encounter Nurse  Referral source IDT Rounds  Reason for visit Routine spiritual support  OnCall Visit No  Spiritual Framework  Presenting Themes Meaning/purpose/sources of inspiration;Significant life change  Community/Connection Family  Patient Stress Factors Health changes  Family Stress Factors None identified  Interventions  Spiritual Care Interventions Made Compassionate presence;Reflective listening;Established relationship of care and support;Prayer  Intervention Outcomes  Outcomes Connection to spiritual care;Awareness of support;Reduced fear  Spiritual Care Plan  Spiritual Care Issues Still Outstanding Chaplain will continue to follow   Routine spiritual care visit today with Sheila [name]. She was sitting up in her recliner in ICU room and stated that she had eaten a good breakfast. She was watching one of her favorite shows on the TV and had a smile on her face today. She engaged well in reflection around her upbringing, relationships, spiritual heritage and illness story. She shared that she is grateful that she is doing better and acknowledges that she will have to go to rehab in order to get stronger. She hopes to return home soon after this. Chaplain provided prayer to close the visit and patient thanked Chaplain for stopping by. No immediate spiritual care needs noted at this time. Chaplain will remain available in order to provide spiritual support and to assess for spiritual need.   Rev. Jolyn Lent, M.Div. Chaplain

## 2022-07-08 DIAGNOSIS — I5022 Chronic systolic (congestive) heart failure: Secondary | ICD-10-CM | POA: Diagnosis not present

## 2022-07-08 DIAGNOSIS — S82842K Displaced bimalleolar fracture of left lower leg, subsequent encounter for closed fracture with nonunion: Secondary | ICD-10-CM

## 2022-07-08 DIAGNOSIS — A419 Sepsis, unspecified organism: Secondary | ICD-10-CM | POA: Diagnosis not present

## 2022-07-08 DIAGNOSIS — N390 Urinary tract infection, site not specified: Secondary | ICD-10-CM | POA: Diagnosis not present

## 2022-07-08 LAB — BASIC METABOLIC PANEL
Anion gap: 14 (ref 5–15)
BUN: 41 mg/dL — ABNORMAL HIGH (ref 8–23)
CO2: 22 mmol/L (ref 22–32)
Calcium: 7.7 mg/dL — ABNORMAL LOW (ref 8.9–10.3)
Chloride: 101 mmol/L (ref 98–111)
Creatinine, Ser: 1.48 mg/dL — ABNORMAL HIGH (ref 0.44–1.00)
GFR, Estimated: 36 mL/min — ABNORMAL LOW (ref >60)
Glucose, Bld: 124 mg/dL — ABNORMAL HIGH (ref 70–99)
Potassium: 3.2 mmol/L — ABNORMAL LOW (ref 3.5–5.1)
Sodium: 137 mmol/L (ref 135–145)

## 2022-07-08 LAB — CBC
HCT: 31.3 % — ABNORMAL LOW (ref 36.0–46.0)
Hemoglobin: 10.3 g/dL — ABNORMAL LOW (ref 12.0–15.0)
MCH: 28.6 pg (ref 26.0–34.0)
MCHC: 32.9 g/dL (ref 30.0–36.0)
MCV: 86.9 fL (ref 80.0–100.0)
Platelets: 322 10*3/uL (ref 150–400)
RBC: 3.6 MIL/uL — ABNORMAL LOW (ref 3.87–5.11)
RDW: 14.9 % (ref 11.5–15.5)
WBC: 11.2 10*3/uL — ABNORMAL HIGH (ref 4.0–10.5)
nRBC: 0 % (ref 0.0–0.2)

## 2022-07-08 MED ORDER — NICOTINE 14 MG/24HR TD PT24
14.0000 mg | MEDICATED_PATCH | Freq: Every day | TRANSDERMAL | Status: DC
  Administered 2022-07-08 – 2022-07-09 (×2): 14 mg via TRANSDERMAL
  Filled 2022-07-08 (×2): qty 1

## 2022-07-08 MED ORDER — POTASSIUM CHLORIDE CRYS ER 20 MEQ PO TBCR
40.0000 meq | EXTENDED_RELEASE_TABLET | Freq: Once | ORAL | Status: AC
  Administered 2022-07-08: 40 meq via ORAL
  Filled 2022-07-08: qty 2

## 2022-07-08 NOTE — Progress Notes (Signed)
Physical Therapy Treatment Patient Details Name: Sheila [name] MRN: [id number] DOB: November 11, 1940 Today's Date: 07/08/2022   History of Present Illness Mrs. Hert, an 82 y/o woman s/p Operative fixation of left distal fibula fracture with a fibulock, intramedullary nail on 07/06/22, with HFrEF, chronic joint pain, anxiety, PUD, COPD by report has been ill for several days. Today she felt worse and weaker. She did suffer a fall landing on her ankles with subsequent pain. Due to her illness and ankle pain EMS activated: EMS called a CODE SEPSIS at 0941 en route to the hospital. EMS reports HR 127, EtCO2 32 (originally 19 upon their arrival), BP 68/38 (after NS bolus), temp 99.4. EMS reports foul odor to urine and frequent urination. Rocephin 1gm IM given by EMS at 0941. She was transported to [facility] for further evaluation.    PT Comments    Patient demonstrates slow labored movement for sitting up at bedside with difficulty moving BLE due to knee stiffness, mild pain in ankles, fair return for completing BLE ROM/strengthening exercises while seated at bedside requiring frequent rest breaks and limited to a few side steps at bedside before having to sit due to c/o fatigue/generalized weakness.  Patient tolerated sitting up in chair after therapy - nurse notified.  Patient will benefit from continued skilled physical therapy in hospital and recommended venue below to increase strength, balance, endurance for safe ADLs and gait.      Recommendations for follow up therapy are one component of a multi-disciplinary discharge planning process, led by the attending physician.  Recommendations may be updated based on patient status, additional functional criteria and insurance authorization.  Follow Up Recommendations  Can patient physically be transported by private vehicle: No    Assistance Recommended at Discharge Set up Supervision/Assistance  Patient can return home with the following A lot of help  with bathing/dressing/bathroom;A lot of help with walking and/or transfers;Help with stairs or ramp for entrance;Assistance with cooking/housework   Equipment Recommendations  None recommended by PT    Recommendations for Other Services       Precautions / Restrictions Precautions Precautions: Fall Restrictions Weight Bearing Restrictions: Yes RLE Weight Bearing: Weight bearing as tolerated LLE Weight Bearing: Weight bearing as tolerated     Mobility  Bed Mobility Overal bed mobility: Needs Assistance Bed Mobility: Supine to Sit     Supine to sit: Min assist, HOB elevated     General bed mobility comments: increased time, labored movment with HOB partially raised    Transfers Overall transfer level: Needs assistance Equipment used: Rolling walker (2 wheels) Transfers: Sit to/from Stand, Bed to chair/wheelchair/BSC Sit to Stand: Min assist, Mod assist   Step pivot transfers: Mod assist       General transfer comment: unsteady labored movement    Ambulation/Gait Ambulation/Gait assistance: Mod assist Gait Distance (Feet): 4 Feet Assistive device: Rolling walker (2 wheels) Gait Pattern/deviations: Decreased step length - right, Decreased step length - left, Decreased stride length, Trunk flexed Gait velocity: slow     General Gait Details: limited to a unsteady labored side steps before having to sit due to c/o fatigue and bilateral ankle discomfort   Stairs             Wheelchair Mobility    Modified Rankin (Stroke Patients Only)       Balance Overall balance assessment: Needs assistance Sitting-balance support: Feet supported, No upper extremity supported Sitting balance-Leahy Scale: Fair Sitting balance - Comments: fair/good seated at EOB   Standing  balance support: During functional activity, Reliant on assistive device for balance, Bilateral upper extremity supported Standing balance-Leahy Scale: Poor Standing balance comment: fair/poor  using RW                            Cognition Arousal/Alertness: Awake/alert Behavior During Therapy: WFL for tasks assessed/performed Overall Cognitive Status: Within Functional Limits for tasks assessed                                          Exercises General Exercises - Lower Extremity Long Arc Quad: Seated, AROM, Strengthening, 10 reps Hip Flexion/Marching: Seated, AROM, Strengthening, Both, 10 reps Other Exercises Other Exercises: BLE isometric gastroc squeezes x 10    General Comments        Pertinent Vitals/Pain Pain Assessment Pain Assessment: Faces Faces Pain Scale: Hurts a little bit Pain Location: Bilateral ankles Pain Descriptors / Indicators: Grimacing, Guarding, Sore Pain Intervention(s): Limited activity within patient's tolerance, Monitored during session, Repositioned    Home Living                          Prior Function            PT Goals (current goals can now be found in the care plan section) Acute Rehab PT Goals Patient Stated Goal: return home after rehab PT Goal Formulation: With patient Time For Goal Achievement: 07/18/22 Potential to Achieve Goals: Good Progress towards PT goals: Progressing toward goals    Frequency    Min 3X/week      PT Plan Current plan remains appropriate    Co-evaluation              AM-PAC PT "6 Clicks" Mobility   Outcome Measure  Help needed turning from your back to your side while in a flat bed without using bedrails?: A Little Help needed moving from lying on your back to sitting on the side of a flat bed without using bedrails?: A Little Help needed moving to and from a bed to a chair (including a wheelchair)?: A Lot Help needed standing up from a chair using your arms (e.g., wheelchair or bedside chair)?: A Lot Help needed to walk in hospital room?: A Lot Help needed climbing 3-5 steps with a railing? : A Lot 6 Click Score: 14    End of Session    Activity Tolerance: Patient tolerated treatment well;Patient limited by fatigue Patient left: in chair;with call bell/phone within reach Nurse Communication: Mobility status PT Visit Diagnosis: Unsteadiness on feet (R26.81);Other abnormalities of gait and mobility (R26.89);Muscle weakness (generalized) (M62.81)     Time: 9604-5409 PT Time Calculation (min) (ACUTE ONLY): 28 min  Charges:  $Therapeutic Exercise: 8-22 mins $Therapeutic Activity: 8-22 mins                     3:15 PM, 07/08/22 Ocie Bob, MPT Physical Therapist with [facility] 336 [phone] office [phone] mobile phone

## 2022-07-08 NOTE — Progress Notes (Signed)
OT Cancellation Note  Patient Details Name: Sheila [name] MRN: [id number] DOB: 1940/07/26   Cancelled Treatment:    Reason Eval/Treat Not Completed: Other (comment). Pt declined treatment today due to "not feeling like it" and "feeling sore." Pt reports she did not sleep well and is not feeling well. Will attempt to see pt later as time permits.   Mckinnley Smithey OT, MOT   Danie Chandler 07/08/2022, 8:20 AM

## 2022-07-08 NOTE — Progress Notes (Signed)
PROGRESS NOTE  Sheila [name]:[id number] DOB: Aug 30, 1940 DOA: 06/30/2022 PCP: Junie Spencer, FNP  Brief History:  VUNG SCHER is an 82 y.o. female with a history of COPD, HFrEF, chronic pain, anxiety, PUD who presented to the ED on 06/30/2022 due to worsening diffuse weakness culminating in a fall at home and subsequent inability to bear weight. EMS reported tachycardia and hypotension. Due to reports of change in urination and dysuria, ceftriaxone was given IM by EMS en route along with IV fluids. In the ED, T 97.5  BP 77/49 - to 95/59 after fluid bolus. HR 66 RR 19. Patient appeared ill, had bruised ankles that were tender. Code sepsis initiated in the field as above. IN ED she received 1 L IVF then LR at 150/hr. She receved Rocephin in the field and Vancomycin in ED. Lab: glucose 124, WBC 20.6 with 82/8/9 BUN 26, Cr 1.91 (baseline 24/1.3) lactic acid 1.9 to 1.7 after resuscitation. BNP 991 (baseline 54) U/A - cloudy, LE lrg, many bacteria/hpf, >50 wbc/hpf. X-ray revealed bilateral ankle fractures. TRH called to admit for continued management of urosepsis and chronic disease mgt.   Foley catheter was inserted due to urinary retention, TOV planned 5/12. With antibiotics, sepsis physiology has resolved, leukocytosis resolved, and antibiotics have been narrowed to ancef per culture data. Cardiology was consulted, recommending no further testing prior to operative fixation of left ankle fracture which was performed 5/13. Went to SDU postoperatively due to increased work of breathing which seems to have resolved with IV solumedrol x 1.   PT evaluations postoperatively recommends SNF with which agrees  Assessment/Plan:  Severe sepsis due to UTI: with organ dysfunction (AKI) -initially on empiric ceftriaxone - Based on urine culture susceptibilities, continued ancef. Leukocytosis resolved, remaining afebrile, blood cultures negative. -sepsis physiology resolved -finished 8 days IV abx    Right distal fibula fracture:  - Orthopedics evaluated, recommends WBAT RLE in CAM boot, no surgery.  - Continue pain control as ordered with oxycodone (tolerated despite multiple allergies).     Left bimalleolar ankle fracture: s/p operative fixation of left distal fibula fracture with fibulock, IM nail 5/13 by Dr. Dallas Schimke.  - WBAT in CAM boot. Will restart heparin VTE ppx this PM >24 hours postoperative on POD #1. - PT evaluations. The patient was declining functionally at home, then fell, is now more impaired functionally than before. I will strongly suggest that she go to short term rehab postoperatively. Daughter amenable.    Increased Work of Breathing:  -Postoperatively, now resolved. VBG reassuring. Remaining in SDU for close monitoring.   Chronic combined HFrEF, HTN:  -Last echo Feb 2024 showed LVEF 35-40% (down from Jan 2023 at 45-50%), G1DD, global hypokinesis, mildly reduced RV systolic function, mild enlargement, normal estimated PA pressure, RVSP 25 - BP soft intially -now back on GDMT-- spironolactone, Entresto, torsemide   Normocytic anemia: Likely due to fractures and exaggerated by hemodilution. No gross bleeding currently. - Continue monitoring. Postoperatively has stable hgb.    AKI on stage IIIb CKD:  -Was given sepsis IVF then IV diuretic. SCr elevated and rising. SCr baseline 1.3-1.4, was 1.9 > 2.2 > now back to baseline. Cr 1.37 postoperatively.  - baseline creatinine 1.2-1.4 -continue torsemide given respiratory issues postoperatively to avoid further deterioration.  - Monitor urine output, bladder scan prn. Had urinary retention earlier in hospitalization (passed voiding trial 5/12) - Continue holding nephrotoxins. - Continue home sensipar   COPD:  -  Continue formulary equivalent controller BD and prn albuterol - No wheezing on exam this morning. Was given high dose steroids overnight which we will hold for the time being.  -continue Dulera and Incruse    Hyponatremia: Improved.   Hypokalemia: Resolved.  - Monitor in AM.   GAD:  - Continue SSRI - prn alprazolam, hydroxyzine   PUD: No abd pain. With renal impairment, this contraindicates systemic NSAID use. - Continue PPI.    Hypothyroidism: Recent TSH 3.300.  - Continue synthroid.    HLD:  - Continue statin    Chronic neck pain:  - Robaxin prn - Topical camphor (takes this at home), could try voltaren topically if ineffective.    Dry skin:  - Topical Tx ordered. May also benefit from mobilizing OOB.   Loose stools: Chronic. No abd pain, remains with benign exam.  - Imodium prn is ok.   Morbid obesity: Body mass index is 48.36 kg/m.    Nocturnal hypoxia:  - Sleep study recommended as outpatient         Family Communication:  no Family at bedside  Consultants:  ortho  Code Status:  DNR  DVT Prophylaxis:  [place] Heparin    Procedures: As Listed in Progress Note Above  Antibiotics: None     Subjective: Patient denies fevers, chills, headache, chest pain, dyspnea, nausea, vomiting, diarrhea, abdominal pain, dysuria, hematuria, hematochezia, and melena.   Objective: Vitals:   07/08/22 0730 07/08/22 0800 07/08/22 0900 07/08/22 1117  BP:  136/62 (!) 147/98   Pulse:  77 70   Resp:  17 20   Temp: 98 F (36.7 C)   97.6 F (36.4 C)  TempSrc: Axillary   Oral  SpO2:  94% 96%   Weight:      Height:        Intake/Output Summary (Last 24 hours) at 07/08/2022 1354 Last data filed at 07/08/2022 1304 Gross per 24 hour  Intake 750 ml  Output 1850 ml  Net -1100 ml   Weight change:  Exam:  General:  Pt is alert, follows commands appropriately, not in acute distress HEENT: No icterus, No thrush, No neck mass, [place]/AT Cardiovascular: RRR, S1/S2, no rubs, no gallops Respiratory: bibasilar rales.  No wheeze Abdomen: Soft/+BS, non tender, non distended, no guarding Extremities: Nonpitting edema, No lymphangitis, No petechiae, No rashes, no synovitis   Data  Reviewed: I have personally reviewed following labs and imaging studies Basic Metabolic Panel: Recent Labs  Lab 07/04/22 0543 07/05/22 0453 07/06/22 0450 07/07/22 0451 07/08/22 0454  NA 136 135 135 136 137  K 3.5 3.4* 3.4* 4.1 3.2*  CL 103 101 100 101 101  CO2 23 22 24 23 22   GLUCOSE 121* 132* 128* 191* 124*  BUN 24* 24* 25* 28* 41*  CREATININE 1.27* 1.16* 1.25* 1.37* 1.48*  CALCIUM 8.1* 8.5* 8.4* 8.1* 7.7*   Liver Function Tests: No results for input(s): "AST", "ALT", "ALKPHOS", "BILITOT", "PROT", "ALBUMIN" in the last 168 hours. No results for input(s): "LIPASE", "AMYLASE" in the last 168 hours. No results for input(s): "AMMONIA" in the last 168 hours. Coagulation Profile: No results for input(s): "INR", "PROTIME" in the last 168 hours. CBC: Recent Labs  Lab 07/03/22 0510 07/05/22 0453 07/06/22 0450 07/07/22 0451 07/08/22 0454  WBC 8.7 8.9 9.1 10.4 11.2*  HGB 9.7* 10.5* 10.5* 10.7* 10.3*  HCT 30.0* 31.2* 32.0* 32.3* 31.3*  MCV 88.0 85.5 85.6 85.9 86.9  PLT 158 260 269 296 322   Cardiac Enzymes: No results for  input(s): "CKTOTAL", "CKMB", "CKMBINDEX", "TROPONINI" in the last 168 hours. BNP: Invalid input(s): "POCBNP" CBG: Recent Labs  Lab 07/04/22 1705 07/07/22 1124  GLUCAP 127* 110*   HbA1C: No results for input(s): "HGBA1C" in the last 72 hours. Urine analysis:    Component Value Date/Time   COLORURINE YELLOW 06/30/2022 1502   APPEARANCEUR CLOUDY (A) 06/30/2022 1502   APPEARANCEUR Clear 05/05/2022 1351   LABSPEC 1.014 06/30/2022 1502   PHURINE 5.0 06/30/2022 1502   GLUCOSEU NEGATIVE 06/30/2022 1502   HGBUR SMALL (A) 06/30/2022 1502   BILIRUBINUR NEGATIVE 06/30/2022 1502   BILIRUBINUR Negative 05/05/2022 1351   KETONESUR NEGATIVE 06/30/2022 1502   PROTEINUR 100 (A) 06/30/2022 1502   UROBILINOGEN 0.2 07/31/2013 1442   NITRITE NEGATIVE 06/30/2022 1502   LEUKOCYTESUR LARGE (A) 06/30/2022 1502   Sepsis  Labs: @LABRCNTIP (procalcitonin:4,lacticidven:4) ) Recent Results (from the past 240 hour(s))  Blood Culture (routine x 2)     Status: None   Collection Time: 06/30/22 11:31 AM   Specimen: Right Antecubital; Blood  Result Value Ref Range Status   Specimen Description RIGHT ANTECUBITAL  Final   Special Requests   Final    BOTTLES DRAWN AEROBIC AND ANAEROBIC Blood Culture results may not be optimal due to an excessive volume of blood received in culture bottles   Culture   Final    NO GROWTH 5 DAYS Performed at [facility], [address], [place], [place] [address]    Report Status 07/05/2022 FINAL  Final  Blood Culture (routine x 2)     Status: None   Collection Time: 06/30/22 11:31 AM   Specimen: Left Antecubital; Blood  Result Value Ref Range Status   Specimen Description LEFT ANTECUBITAL  Final   Special Requests   Final    BOTTLES DRAWN AEROBIC AND ANAEROBIC Blood Culture adequate volume   Culture   Final    NO GROWTH 5 DAYS Performed at [facility], [address], [place], [place] [address]    Report Status 07/05/2022 FINAL  Final  Urine Culture     Status: Abnormal   Collection Time: 06/30/22  3:02 PM   Specimen: Urine, Random  Result Value Ref Range Status   Specimen Description   Final    URINE, RANDOM Performed at [facility], [address], [place], [place] [address]    Special Requests   Final    NONE Reflexed from [id number] Performed at [facility], [address] [place], [place] [address]    Culture >=100,000 COLONIES/mL ESCHERICHIA COLI (A)  Final   Report Status 07/02/2022 FINAL  Final   Organism ID, Bacteria ESCHERICHIA COLI (A)  Final      Susceptibility   Escherichia coli - MIC*    AMPICILLIN <=2 SENSITIVE Sensitive     CEFAZOLIN <=4 SENSITIVE Sensitive     CEFEPIME <=0.12 SENSITIVE Sensitive     CEFTRIAXONE <=0.25 SENSITIVE Sensitive     CIPROFLOXACIN <=0.25 SENSITIVE Sensitive     GENTAMICIN <=1 SENSITIVE Sensitive     IMIPENEM <=0.25 SENSITIVE  Sensitive     NITROFURANTOIN <=16 SENSITIVE Sensitive     TRIMETH/SULFA <=20 SENSITIVE Sensitive     AMPICILLIN/SULBACTAM <=2 SENSITIVE Sensitive     PIP/TAZO <=4 SENSITIVE Sensitive     * >=100,000 COLONIES/mL ESCHERICHIA COLI  MRSA Next Gen by PCR, Nasal     Status: None   Collection Time: 06/30/22 10:00 PM   Specimen: Nasal Mucosa; Nasal Swab  Result Value Ref Range Status   MRSA by PCR Next  Gen NOT DETECTED NOT DETECTED Final    Comment: (NOTE) The GeneXpert MRSA Assay (FDA approved for NASAL specimens only), is one component of a comprehensive MRSA colonization surveillance program. It is not intended to diagnose MRSA infection nor to guide or monitor treatment for MRSA infections. Test performance is not FDA approved in patients less than 65 years old. Performed at [facility], [address] [place], [place] [address]   Surgical pcr screen     Status: None   Collection Time: 07/06/22  5:40 AM   Specimen: Nasal Mucosa; Nasal Swab  Result Value Ref Range Status   MRSA, PCR NEGATIVE NEGATIVE Final   Staphylococcus aureus NEGATIVE NEGATIVE Final    Comment: (NOTE) The Xpert SA Assay (FDA approved for NASAL specimens in patients 34 years of age and older), is one component of a comprehensive surveillance program. It is not intended to diagnose infection nor to guide or monitor treatment. Performed at [facility], [address] [place], [place] [address]      Scheduled Meds:  Chlorhexidine Gluconate Cloth  6 each Topical Q0600   cholecalciferol  1,000 Units Oral Daily   cinacalcet  30 mg Oral QODAY   triamcinolone cream   Topical BID   And   DermaCerin  1 Application Apply externally BID   gabapentin  300 mg Oral TID   guaiFENesin  600 mg Oral BID   heparin injection (subcutaneous)  5,000 Units Subcutaneous Q8H   levothyroxine  100 mcg Oral Q0600   loratadine  10 mg Oral Daily   metoprolol tartrate  12.5 mg Oral BID   mometasone-formoterol  2 puff Inhalation BID    nicotine  14 mg Transdermal Daily   pantoprazole  40 mg Oral BID   pravastatin  80 mg Oral q1800   sacubitril-valsartan  1 tablet Oral BID   sertraline  100 mg Oral Daily   spironolactone  25 mg Oral Daily   torsemide  20 mg Oral BID   umeclidinium bromide  1 puff Inhalation Daily   Continuous Infusions:  Procedures/Studies: DG Chest Port 1 View  Result Date: 07/06/2022 CLINICAL DATA:  Hypoxia EXAM: PORTABLE CHEST 1 VIEW COMPARISON:  06/30/2022 FINDINGS: Redemonstrated cardiomegaly. Aortic tortuosity and atherosclerosis. No focal pulmonary opacity. No pleural effusion pneumothorax. No acute osseous abnormality. IMPRESSION: No acute cardiopulmonary process. Electronically Signed   By: Wiliam Ke M.D.   On: 07/06/2022 19:40   DG Ankle Complete Left  Result Date: 07/06/2022 CLINICAL DATA:  Left distal fibular ORIF. EXAM: LEFT ANKLE COMPLETE - 3+ VIEW COMPARISON:  Left ankle radiographs 06/30/2022 FINDINGS: Images were performed intraoperatively without the presence of a radiologist. Interval distal approach intramedullary nail fixation of the previously seen fracture of the distal femoral metadiaphysis. Total fluoroscopy images: 3 Total fluoroscopy time: 82 seconds Total dose: Radiation Exposure Index (as provided by the fluoroscopic device): 3.98 mGy air Kerma Please see intraoperative findings for further detail. IMPRESSION: Intraoperative fluoroscopy provided for left distal fibular fracture ORIF. Electronically Signed   By: Neita Garnet M.D.   On: 07/06/2022 19:30   DG Ankle Left Port  Result Date: 07/06/2022 CLINICAL DATA:  Closed ankle fracture EXAM: PORTABLE LEFT ANKLE - 2 VIEW COMPARISON:  None Available. FINDINGS: Distal left fibular intramedullary nail is present fixating a distal fibular fracture. Alignment is anatomic. There is no dislocation. There is soft tissue swelling surrounding the ankle. IMPRESSION: Distal left fibular intramedullary nail fixating a distal fibular fracture.  Alignment is anatomic. Electronically Signed  By: Darliss Cheney M.D.   On: 07/06/2022 19:18   DG C-Arm 1-60 Min-No Report  Result Date: 07/06/2022 Fluoroscopy was utilized by the requesting physician.  No radiographic interpretation.   US RENAL  Result Date: 07/01/2022 CLINICAL DATA:  Acute kidney injury EXAM: RENAL / URINARY TRACT ULTRASOUND COMPLETE COMPARISON:  CT 03/06/2021 FINDINGS: Right Kidney: Renal measurements: 10.3 x 4.0 x 5.3 cm = volume: 114.47 mL. Echogenicity within normal limits. No mass or hydronephrosis visualized. Left Kidney: Renal measurements: 11.0 x 4.8 x 4.2 cm = volume: 114.59 mL. Echogenicity within normal limits. No mass or hydronephrosis visualized. Bladder: Appears normal for degree of bladder distention. Ureteral jets are seen. Other: Note is made of a gallstone. IMPRESSION: No collecting system dilatation. Incidental note made of a gallstone Electronically Signed   By: Karen Kays M.D.   On: 07/01/2022 11:51   DG Ankle Complete Right  Result Date: 06/30/2022 CLINICAL DATA:  Bilateral ankle pain and swelling after fall EXAM: RIGHT ANKLE - COMPLETE 3+ VIEW; LEFT ANKLE COMPLETE - 3+ VIEW COMPARISON:  Left ankle radiographs 12/29/2014 FINDINGS: Left: Acute mildly displaced oblique fracture through the distal left fibular metadiaphysis. The fracture line extends into the tibiofibular syndesmosis above the ankle mortise. Mild lateral subluxation of the talus in relation to the tibia with widening of the medial ankle mortise. Marked soft tissue swelling about the left foot. Right: Nondisplaced transverse fracture through the distal right fibular metaphysis. The fracture line extends into the tibiofibular syndesmosis at the level of the ankle mortise. Marked soft tissue swelling about the lateral malleolus. IMPRESSION: 1. Left: Oblique displaced fracture through the distal left fibular metadiaphysis. Lateral subluxation of the left talus in relation to the tibia. 2. Right: Acute  nondisplaced transverse fracture through the distal right fibular metaphysis. Electronically Signed   By: Minerva Fester M.D.   On: 06/30/2022 18:13   DG Ankle Complete Left  Result Date: 06/30/2022 CLINICAL DATA:  Bilateral ankle pain and swelling after fall EXAM: RIGHT ANKLE - COMPLETE 3+ VIEW; LEFT ANKLE COMPLETE - 3+ VIEW COMPARISON:  Left ankle radiographs 12/29/2014 FINDINGS: Left: Acute mildly displaced oblique fracture through the distal left fibular metadiaphysis. The fracture line extends into the tibiofibular syndesmosis above the ankle mortise. Mild lateral subluxation of the talus in relation to the tibia with widening of the medial ankle mortise. Marked soft tissue swelling about the left foot. Right: Nondisplaced transverse fracture through the distal right fibular metaphysis. The fracture line extends into the tibiofibular syndesmosis at the level of the ankle mortise. Marked soft tissue swelling about the lateral malleolus. IMPRESSION: 1. Left: Oblique displaced fracture through the distal left fibular metadiaphysis. Lateral subluxation of the left talus in relation to the tibia. 2. Right: Acute nondisplaced transverse fracture through the distal right fibular metaphysis. Electronically Signed   By: Minerva Fester M.D.   On: 06/30/2022 18:13   DG Chest Port 1 View  Result Date: 06/30/2022 CLINICAL DATA:  Possible sepsis. EXAM: PORTABLE CHEST 1 VIEW COMPARISON:  Chest x-ray dated Jun 26, 2021. FINDINGS: Stable cardiomediastinal silhouette. Normal pulmonary vascularity. No focal consolidation, pleural effusion, or pneumothorax. No acute osseous abnormality. IMPRESSION: No active disease. Electronically Signed   By: Obie Dredge M.D.   On: 06/30/2022 11:05    Catarina Hartshorn, DO  [facility]  If 7PM-7AM, please contact night-coverage [email or web address] Password [email or web address] 07/08/2022, 1:54 PM   LOS: 8 days

## 2022-07-09 DIAGNOSIS — A419 Sepsis, unspecified organism: Secondary | ICD-10-CM | POA: Diagnosis not present

## 2022-07-09 DIAGNOSIS — I5042 Chronic combined systolic (congestive) and diastolic (congestive) heart failure: Secondary | ICD-10-CM | POA: Diagnosis not present

## 2022-07-09 DIAGNOSIS — R079 Chest pain, unspecified: Secondary | ICD-10-CM | POA: Diagnosis not present

## 2022-07-09 DIAGNOSIS — R092 Respiratory arrest: Secondary | ICD-10-CM | POA: Diagnosis not present

## 2022-07-09 DIAGNOSIS — R001 Bradycardia, unspecified: Secondary | ICD-10-CM | POA: Diagnosis not present

## 2022-07-09 DIAGNOSIS — I5022 Chronic systolic (congestive) heart failure: Secondary | ICD-10-CM | POA: Diagnosis not present

## 2022-07-09 DIAGNOSIS — I130 Hypertensive heart and chronic kidney disease with heart failure and stage 1 through stage 4 chronic kidney disease, or unspecified chronic kidney disease: Secondary | ICD-10-CM | POA: Diagnosis not present

## 2022-07-09 DIAGNOSIS — Z7189 Other specified counseling: Secondary | ICD-10-CM | POA: Diagnosis not present

## 2022-07-09 DIAGNOSIS — R6520 Severe sepsis without septic shock: Secondary | ICD-10-CM | POA: Diagnosis not present

## 2022-07-09 DIAGNOSIS — S82424D Nondisplaced transverse fracture of shaft of right fibula, subsequent encounter for closed fracture with routine healing: Secondary | ICD-10-CM | POA: Diagnosis not present

## 2022-07-09 DIAGNOSIS — S82892K Other fracture of left lower leg, subsequent encounter for closed fracture with nonunion: Secondary | ICD-10-CM

## 2022-07-09 DIAGNOSIS — S82842D Displaced bimalleolar fracture of left lower leg, subsequent encounter for closed fracture with routine healing: Secondary | ICD-10-CM | POA: Diagnosis not present

## 2022-07-09 DIAGNOSIS — S82831A Other fracture of upper and lower end of right fibula, initial encounter for closed fracture: Secondary | ICD-10-CM | POA: Diagnosis not present

## 2022-07-09 DIAGNOSIS — I10 Essential (primary) hypertension: Secondary | ICD-10-CM | POA: Diagnosis not present

## 2022-07-09 DIAGNOSIS — I468 Cardiac arrest due to other underlying condition: Secondary | ICD-10-CM | POA: Diagnosis not present

## 2022-07-09 DIAGNOSIS — K279 Peptic ulcer, site unspecified, unspecified as acute or chronic, without hemorrhage or perforation: Secondary | ICD-10-CM | POA: Diagnosis not present

## 2022-07-09 DIAGNOSIS — R464 Slowness and poor responsiveness: Secondary | ICD-10-CM | POA: Diagnosis not present

## 2022-07-09 DIAGNOSIS — E039 Hypothyroidism, unspecified: Secondary | ICD-10-CM | POA: Diagnosis not present

## 2022-07-09 DIAGNOSIS — R52 Pain, unspecified: Secondary | ICD-10-CM | POA: Diagnosis not present

## 2022-07-09 DIAGNOSIS — N189 Chronic kidney disease, unspecified: Secondary | ICD-10-CM | POA: Diagnosis not present

## 2022-07-09 DIAGNOSIS — I499 Cardiac arrhythmia, unspecified: Secondary | ICD-10-CM | POA: Diagnosis not present

## 2022-07-09 DIAGNOSIS — S82842A Displaced bimalleolar fracture of left lower leg, initial encounter for closed fracture: Secondary | ICD-10-CM | POA: Diagnosis not present

## 2022-07-09 DIAGNOSIS — R6889 Other general symptoms and signs: Secondary | ICD-10-CM | POA: Diagnosis not present

## 2022-07-09 DIAGNOSIS — Z743 Need for continuous supervision: Secondary | ICD-10-CM | POA: Diagnosis not present

## 2022-07-09 DIAGNOSIS — J449 Chronic obstructive pulmonary disease, unspecified: Secondary | ICD-10-CM | POA: Diagnosis not present

## 2022-07-09 DIAGNOSIS — Y93C9 Activity, other involving computer technology and electronic devices: Secondary | ICD-10-CM | POA: Diagnosis not present

## 2022-07-09 DIAGNOSIS — N390 Urinary tract infection, site not specified: Secondary | ICD-10-CM | POA: Diagnosis not present

## 2022-07-09 DIAGNOSIS — N179 Acute kidney failure, unspecified: Secondary | ICD-10-CM | POA: Diagnosis not present

## 2022-07-09 DIAGNOSIS — F32A Depression, unspecified: Secondary | ICD-10-CM | POA: Diagnosis not present

## 2022-07-09 DIAGNOSIS — R7309 Other abnormal glucose: Secondary | ICD-10-CM | POA: Diagnosis not present

## 2022-07-09 DIAGNOSIS — E785 Hyperlipidemia, unspecified: Secondary | ICD-10-CM | POA: Diagnosis not present

## 2022-07-09 DIAGNOSIS — N1832 Chronic kidney disease, stage 3b: Secondary | ICD-10-CM | POA: Diagnosis not present

## 2022-07-09 DIAGNOSIS — R404 Transient alteration of awareness: Secondary | ICD-10-CM | POA: Diagnosis not present

## 2022-07-09 DIAGNOSIS — Z79899 Other long term (current) drug therapy: Secondary | ICD-10-CM | POA: Diagnosis not present

## 2022-07-09 DIAGNOSIS — G8929 Other chronic pain: Secondary | ICD-10-CM | POA: Diagnosis not present

## 2022-07-09 DIAGNOSIS — S82832D Other fracture of upper and lower end of left fibula, subsequent encounter for closed fracture with routine healing: Secondary | ICD-10-CM | POA: Diagnosis not present

## 2022-07-09 DIAGNOSIS — I469 Cardiac arrest, cause unspecified: Secondary | ICD-10-CM | POA: Diagnosis not present

## 2022-07-09 DIAGNOSIS — D649 Anemia, unspecified: Secondary | ICD-10-CM | POA: Diagnosis not present

## 2022-07-09 LAB — BASIC METABOLIC PANEL
Anion gap: 12 (ref 5–15)
BUN: 42 mg/dL — ABNORMAL HIGH (ref 8–23)
CO2: 23 mmol/L (ref 22–32)
Calcium: 8.1 mg/dL — ABNORMAL LOW (ref 8.9–10.3)
Chloride: 103 mmol/L (ref 98–111)
Creatinine, Ser: 1.41 mg/dL — ABNORMAL HIGH (ref 0.44–1.00)
GFR, Estimated: 38 mL/min — ABNORMAL LOW (ref >60)
Glucose, Bld: 129 mg/dL — ABNORMAL HIGH (ref 70–99)
Potassium: 3.7 mmol/L (ref 3.5–5.1)
Sodium: 138 mmol/L (ref 135–145)

## 2022-07-09 LAB — MAGNESIUM: Magnesium: 1.6 mg/dL — ABNORMAL LOW (ref 1.7–2.4)

## 2022-07-09 MED ORDER — OXYCODONE HCL 5 MG PO TABS: 2.5000 mg | ORAL_TABLET | ORAL | 0 refills | Status: DC | PRN

## 2022-07-09 MED ORDER — POTASSIUM CHLORIDE CRYS ER 20 MEQ PO TBCR
20.0000 meq | EXTENDED_RELEASE_TABLET | Freq: Once | ORAL | Status: AC
  Administered 2022-07-09: 20 meq via ORAL
  Filled 2022-07-09: qty 1

## 2022-07-09 MED ORDER — OXYCODONE HCL 5 MG PO TABS: 5.0000 mg | ORAL_TABLET | ORAL | 0 refills | Status: DC | PRN

## 2022-07-09 MED ORDER — MAGNESIUM SULFATE 2 GM/50ML IV SOLN
2.0000 g | Freq: Once | INTRAVENOUS | Status: AC
  Administered 2022-07-09: 2 g via INTRAVENOUS
  Filled 2022-07-09: qty 50

## 2022-07-09 MED ORDER — ALPRAZOLAM 0.5 MG PO TABS
0.5000 mg | ORAL_TABLET | Freq: Two times a day (BID) | ORAL | 0 refills | Status: DC | PRN
Start: 2022-07-09 — End: 2022-07-22

## 2022-07-09 NOTE — Progress Notes (Signed)
Physical Therapy Treatment Patient Details Name: Sheila [name] MRN: [id number] DOB: 05/09/1940 Today's Date: 07/09/2022   History of Present Illness Mrs. Lambo, an 82 y/o woman s/p Operative fixation of left distal fibula fracture with a fibulock, intramedullary nail on 07/06/22, with HFrEF, chronic joint pain, anxiety, PUD, COPD by report has been ill for several days. Today she felt worse and weaker. She did suffer a fall landing on her ankles with subsequent pain. Due to her illness and ankle pain EMS activated: EMS called a CODE SEPSIS at 0941 en route to the hospital. EMS reports HR 127, EtCO2 32 (originally 19 upon their arrival), BP 68/38 (after NS bolus), temp 99.4. EMS reports foul odor to urine and frequent urination. Rocephin 1gm IM given by EMS at 0941. She was transported to [facility] for further evaluation.    PT Comments    Patient demonstrates slow labored movement for sitting up at bedside with difficulty moving BLE due to weakness and once seated c/o nausea, frequent coughing that resolved after a few minutes.  Patient required frequent rest breaks while completing BLE exercises and limited to a few slow labored side steps at bedside before having to sit due to c/o fatigue and poor standing balance.  Patient tolerated sitting up in chair after therapy.  Patient will benefit from continued skilled physical therapy in hospital and recommended venue below to increase strength, balance, endurance for safe ADLs and gait.      Recommendations for follow up therapy are one component of a multi-disciplinary discharge planning process, led by the attending physician.  Recommendations may be updated based on patient status, additional functional criteria and insurance authorization.  Follow Up Recommendations  Can patient physically be transported by private vehicle: No    Assistance Recommended at Discharge Set up Supervision/Assistance  Patient can return home with the following A lot  of help with bathing/dressing/bathroom;A lot of help with walking and/or transfers;Help with stairs or ramp for entrance;Assistance with cooking/housework   Equipment Recommendations  None recommended by PT    Recommendations for Other Services       Precautions / Restrictions Precautions Precautions: Fall Required Braces or Orthoses: Other Brace Other Brace: CAM boots BLE Restrictions Weight Bearing Restrictions: Yes RLE Weight Bearing: Weight bearing as tolerated LLE Weight Bearing: Weight bearing as tolerated     Mobility  Bed Mobility Overal bed mobility: Needs Assistance Bed Mobility: Supine to Sit     Supine to sit: Min assist, HOB elevated     General bed mobility comments: increased time, labored movement with mild diffiuclty breathing, coughing once seated    Transfers Overall transfer level: Needs assistance Equipment used: Rolling walker (2 wheels) Transfers: Sit to/from Stand, Bed to chair/wheelchair/BSC Sit to Stand: Min assist, Mod assist   Step pivot transfers: Mod assist       General transfer comment: unsteady labored movement with diffiuclty advancing BLE    Ambulation/Gait Ambulation/Gait assistance: Mod assist Gait Distance (Feet): 4 Feet Assistive device: Rolling walker (2 wheels) Gait Pattern/deviations: Decreased step length - right, Decreased step length - left, Decreased stride length, Trunk flexed Gait velocity: slow     General Gait Details: limited to a few slow labored unsteady side steps with c/o discomfort mild pain in anlkles   Stairs             Wheelchair Mobility    Modified Rankin (Stroke Patients Only)       Balance Overall balance assessment: Needs assistance Sitting-balance support: Feet  supported, No upper extremity supported Sitting balance-Leahy Scale: Fair Sitting balance - Comments: fair/good seated at EOB   Standing balance support: Reliant on assistive device for balance, During functional  activity, Bilateral upper extremity supported Standing balance-Leahy Scale: Poor Standing balance comment: fair/poor using RW                            Cognition Arousal/Alertness: Awake/alert Behavior During Therapy: WFL for tasks assessed/performed Overall Cognitive Status: Within Functional Limits for tasks assessed                                          Exercises General Exercises - Lower Extremity Ankle Circles/Pumps: Seated, AROM, Strengthening, Both, 10 reps Long Arc Quad: Seated, AROM, Strengthening, 10 reps Hip Flexion/Marching: Seated, AROM, Strengthening, Both, 10 reps    General Comments        Pertinent Vitals/Pain Pain Assessment Pain Assessment: Faces Faces Pain Scale: Hurts a little bit Pain Location: Bilateral ankles Pain Descriptors / Indicators: Grimacing, Sore Pain Intervention(s): Limited activity within patient's tolerance, Monitored during session, Repositioned    Home Living                          Prior Function            PT Goals (current goals can now be found in the care plan section) Acute Rehab PT Goals Patient Stated Goal: return home after rehab PT Goal Formulation: With patient Time For Goal Achievement: 07/18/22 Potential to Achieve Goals: Good Progress towards PT goals: Progressing toward goals    Frequency    Min 3X/week      PT Plan Current plan remains appropriate    Co-evaluation              AM-PAC PT "6 Clicks" Mobility   Outcome Measure  Help needed turning from your back to your side while in a flat bed without using bedrails?: A Little Help needed moving from lying on your back to sitting on the side of a flat bed without using bedrails?: A Little Help needed moving to and from a bed to a chair (including a wheelchair)?: A Lot Help needed standing up from a chair using your arms (e.g., wheelchair or bedside chair)?: A Lot Help needed to walk in hospital room?: A  Lot Help needed climbing 3-5 steps with a railing? : A Lot 6 Click Score: 14    End of Session   Activity Tolerance: Patient tolerated treatment well;Patient limited by fatigue Patient left: in chair;with call bell/phone within reach Nurse Communication: Mobility status PT Visit Diagnosis: Unsteadiness on feet (R26.81);Other abnormalities of gait and mobility (R26.89);Muscle weakness (generalized) (M62.81)     Time: 2956-2130 PT Time Calculation (min) (ACUTE ONLY): 20 min  Charges:  $Therapeutic Exercise: 8-22 mins $Therapeutic Activity: 8-22 mins                     10:52 AM, 07/09/22 Ocie Bob, MPT Physical Therapist with [facility] [phone] office [phone] mobile phone

## 2022-07-09 NOTE — Care Management Important Message (Signed)
Important Message  Patient Details  Name: Sheila [name] MRN: [id number] Date of Birth: 1940/10/23   Medicare Important Message Given:  Yes     Corey Harold 07/09/2022, 10:45 AM

## 2022-07-09 NOTE — TOC Transition Note (Signed)
Transition of Care [facility]) - CM/SW Discharge Note   Patient Details  Name: Sheila [name] MRN: [id number] Date of Birth: Sep 10, 1940  Transition of [facility]) CM/SW Contact:  Annice Needy, LCSW Phone Number: 07/09/2022, 11:22 AM   Clinical Narrative:    D/c clinicals sent to the facility. Nurse to call report. EMS contacted for transport. TOC signing off.    Final next level of care: Skilled Nursing Facility Barriers to Discharge: No Barriers Identified   Patient Goals and CMS Choice CMS Medicare.gov Compare Post Acute Care list provided to:: Patient Represenative (must comment) Choice offered to / list presented to : Adult Children  Discharge Placement                Patient chooses bed at: [facility] Patient to be transferred to facility by: [facility] Name of family member notified: daughter Patient and family notified of of transfer: 07/09/22  Discharge Plan and Services Additional resources added to the After Visit Summary for   In-house Referral: Clinical Social Work Discharge Planning Services: CM Consult                                 Social Determinants of Health (SDOH) Interventions SDOH Screenings   Food Insecurity: No Food Insecurity (05/13/2022)  Housing: Low Risk  (05/13/2022)  Transportation Needs: No Transportation Needs (05/13/2022)  Utilities: Not At Risk (05/13/2022)  Alcohol Screen: Low Risk  (05/13/2022)  Depression (PHQ2-9): Low Risk  (05/13/2022)  Financial Resource Strain: Low Risk  (05/13/2022)  Physical Activity: Insufficiently Active (05/13/2022)  Social Connections: Socially Isolated (05/13/2022)  Stress: No Stress Concern Present (05/13/2022)  Tobacco Use: High Risk (07/06/2022)     Readmission Risk Interventions    07/01/2022   10:48 AM 06/27/2021   12:48 PM  Readmission Risk Prevention Plan  Transportation Screening Complete Complete  PCP or Specialist Appt within 3-5 Days  Not Complete  HRI or Home Care Consult Complete Complete   Social Work Consult for Recovery Care Planning/Counseling Complete Complete  Palliative Care Screening Not Applicable Not Applicable  Medication Review Oceanographer) Complete Complete

## 2022-07-09 NOTE — Discharge Summary (Signed)
Physician Discharge Summary   Patient: Sheila [name] MRN: [id number] DOB: 1940/07/01  Admit date:     06/30/2022  Discharge date: 07/09/22  Discharge Physician: Onalee Hua Felishia Wartman   PCP: Junie Spencer, FNP   Recommendations at discharge:   Please follow up with primary care provider within 1-2 weeks  Please repeat BMP and CBC in one week   Hospital Course: TIEA CHEUVRONT is an 82 y.o. female with a history of COPD, HFrEF, chronic pain, anxiety, PUD who presented to the ED on 06/30/2022 due to worsening diffuse weakness culminating in a fall at home and subsequent inability to bear weight. EMS reported tachycardia and hypotension. Due to reports of change in urination and dysuria, ceftriaxone was given IM by EMS en route along with IV fluids. In the ED, T 97.5  BP 77/49 - to 95/59 after fluid bolus. HR 66 RR 19. Patient appeared ill, had bruised ankles that were tender. Code sepsis initiated in the field as above. IN ED she received 1 L IVF then LR at 150/hr. She receved Rocephin in the field and Vancomycin in ED. Lab: glucose 124, WBC 20.6 with 82/8/9 BUN 26, Cr 1.91 (baseline 24/1.3) lactic acid 1.9 to 1.7 after resuscitation. BNP 991 (baseline 54) U/A - cloudy, LE lrg, many bacteria/hpf, >50 wbc/hpf. X-ray revealed bilateral ankle fractures. TRH called to admit for continued management of urosepsis and chronic disease mgt.   Foley catheter was inserted due to urinary retention, TOV planned 5/12. With antibiotics, sepsis physiology has resolved, leukocytosis resolved, and antibiotics have been narrowed to ancef per culture data. Cardiology was consulted, recommending no further testing prior to operative fixation of left ankle fracture which was performed 5/13. Went to SDU postoperatively due to increased work of breathing which seems to have resolved with IV solumedrol x 1.   PT evaluations postoperatively recommends SNF with which agrees  Assessment and Plan:  Severe sepsis due to UTI: with organ  dysfunction (AKI) -initially on empiric ceftriaxone - Based on urine culture susceptibilities, continued ancef. Leukocytosis resolved, remaining afebrile, blood cultures negative. -sepsis physiology resolved -finished 8 days IV abx>>Remains stable   Right distal fibula fracture:  - Orthopedics evaluated, recommends WBAT RLE in CAM boot, no surgery.  - Continue pain control as ordered with oxycodone (tolerated despite multiple allergies).   -Rx given for oxycodone 5 mg for d/c  -instructed patient not to take her usual home tramadol at this time   Left bimalleolar ankle fracture: s/p operative fixation of left distal fibula fracture with fibulock, IM nail 5/13 by Dr. Dallas Schimke.  - WBAT in CAM boot. Will restart heparin VTE ppx this PM >24 hours postoperative on POD #1. - PT evaluations. The patient was declining functionally at home, then fell, is now more impaired functionally than before. I will strongly suggest that she go to short term rehab postoperatively. Daughter amenable.  -Rx given for oxycodone 5 mg for d/c  -instructed patient not to take her usual home tramadol at this time   Increased Work of Breathing:  -Postoperatively, now resolved. VBG reassuring. Remaining in SDU for close monitoring. -remains stable on RA without any sob over 48 hours   Chronic combined HFrEF, HTN:  -Last echo Feb 2024 showed LVEF 35-40% (down from Jan 2023 at 45-50%), G1DD, global hypokinesis, mildly reduced RV systolic function, mild enlargement, normal estimated PA pressure, RVSP 25 - BP soft intially -now back on GDMT-- spironolactone, Entresto, torsemide   Normocytic anemia: Likely due to fractures and exaggerated  by hemodilution. No gross bleeding currently. - Continue monitoring. Postoperatively has stable hgb.    AKI on stage IIIb CKD:  -Was given sepsis IVF then IV diuretic. SCr elevated and rising. SCr baseline 1.3-1.4, was 1.9 > 2.2 > now back to baseline. Cr 1.37 postoperatively.  -  baseline creatinine 1.2-1.4 -continue torsemide given respiratory issues postoperatively to avoid further deterioration.  - Monitor urine output, bladder scan prn. Had urinary retention earlier in hospitalization (passed voiding trial 5/12) - Continue holding nephrotoxins. - Continue home sensipar -serum creatinine 1.41 at time of d/c   COPD:  - Continue formulary equivalent controller BD and prn albuterol - No wheezing on exam this morning. Was given high dose steroids overnight which we will hold for the time being.  -continue Dulera and Incruse -stable on RA at time of dc   Hyponatremia: Improved.   Hypokalemia: Resolved.  - Monitor in AM.   GAD:  - Continue SSRI - prn alprazolam, hydroxyzine   PUD: No abd pain. With renal impairment, this contraindicates systemic NSAID use. - Continue PPI.    Hypothyroidism: Recent TSH 3.300.  - Continue synthroid.    HLD:  - Continue statin    Chronic neck pain:  - Robaxin prn - Topical camphor (takes this at home), could try voltaren topically if ineffective.    Dry skin:  - Topical Tx ordered. May also benefit from mobilizing OOB.   Loose stools: Chronic. No abd pain, remains with benign exam.  - Imodium prn is ok.   Morbid obesity: Body mass index is 48.36 kg/m.    Nocturnal hypoxia:  - Sleep study recommended as outpatient      Pain control - [place] Controlled Substance Reporting System database was reviewed. and patient was instructed, not to drive, operate heavy machinery, perform activities at heights, swimming or participation in water activities or provide baby-sitting services while on Pain, Sleep and Anxiety Medications; until their outpatient Physician has advised to do so again. Also recommended to not to take more than prescribed Pain, Sleep and Anxiety Medications.  Consultants: ortho Procedures performed: IM nail left ankle  Disposition: Skilled nursing facility Diet recommendation:  Cardiac  diet DISCHARGE MEDICATION: Allergies as of 07/09/2022       Reactions   Ibuprofen Other (See Comments)   History of bleeding ulcers - contra-indicated   Benadryl [diphenhydramine Hcl] Other (See Comments)   Worsening depression   Lipitor [atorvastatin] Other (See Comments)   Body aches.   Baclofen Other (See Comments)   Loopy    Hydrocodone Nausea And Vomiting   Codeine Nausea And Vomiting   Morphine And Codeine Nausea And Vomiting   Tdap [tetanus-diphth-acell Pertussis] Swelling   Redness and localized swelling at injection site        Medication List     STOP taking these medications    cephALEXin 500 MG capsule Commonly known as: KEFLEX   hydrOXYzine 25 MG capsule Commonly known as: VISTARIL   ketoconazole 2 % cream Commonly known as: NIZORAL   magic mouthwash w/lidocaine Soln   nystatin powder Commonly known as: MYCOSTATIN/NYSTOP   terbinafine 250 MG tablet Commonly known as: LamISIL   traMADol 50 MG tablet Commonly known as: ULTRAM   triamcinolone cream 0.1 % Commonly known as: KENALOG       TAKE these medications    albuterol (2.5 MG/3ML) 0.083% nebulizer solution Commonly known as: PROVENTIL NEBULIZE 1 VIAL EVERY 6 TO 8 HOURS AS NEEDED FOR WHEEZING OR SHORTNESS OF BREATH  ALPRAZolam 0.5 MG tablet Commonly known as: XANAX Take 1 tablet (0.5 mg total) by mouth 2 (two) times daily as needed.   Breztri Aerosphere 160-9-4.8 MCG/ACT Aero Generic drug: Budeson-Glycopyrrol-Formoterol INHALE 2 PUFFS INTO LUNGS 2 TIMES A DAY   cinacalcet 30 MG tablet Commonly known as: Sensipar Take 1 tablet (30 mg total) by mouth every other day.   CRANBERRY PO Take 1 tablet by mouth daily.   dexlansoprazole 60 MG capsule Commonly known as: Dexilant Take 1 capsule (60 mg total) by mouth daily.   fluticasone 0.05 % cream Commonly known as: CUTIVATE Apply topically 2 (two) times daily as needed.   gabapentin 300 MG capsule Commonly known as:  NEURONTIN TAKE ONE CAPSULE THREE TIMES DAILY   levocetirizine 5 MG tablet Commonly known as: XYZAL TAKE ONE TABLET ONCE DAILY EVERY EVENING   levothyroxine 100 MCG tablet Commonly known as: SYNTHROID TAKE ONE TABLET ONCE DAILY   Livalo 4 MG Tabs Generic drug: Pitavastatin Calcium TAKE ONE TABLET ONCE DAILY   metoprolol tartrate 25 MG tablet Commonly known as: LOPRESSOR TAKE 1/2 TABLET TWICE DAILY   oxyCODONE 5 MG immediate release tablet Commonly known as: Oxy IR/ROXICODONE Take 1 tablet (5 mg total) by mouth every 4 (four) hours as needed for moderate pain or severe pain.   Prolia 60 MG/ML Sosy injection Generic drug: denosumab Inject into the skin.   denosumab 60 MG/ML Sosy injection Commonly known as: PROLIA Inject 60 mg into the skin every 6 (six) months.   sacubitril-valsartan 97-103 MG Commonly known as: ENTRESTO Take 1 tablet by mouth 2 (two) times daily.   sertraline 100 MG tablet Commonly known as: ZOLOFT TAKE ONE TABLET ONCE DAILY   spironolactone 25 MG tablet Commonly known as: ALDACTONE TAKE ONE TABLET ONCE DAILY   torsemide 20 MG tablet Commonly known as: DEMADEX Take 1 tablet (20 mg total) by mouth 2 (two) times daily. TAKE 2 TABLETS DAILY AS DIRECTED Strength: 20 mg   VISINE OP Place 1 drop into both eyes daily as needed (dry eyes/itching).   Vitamin D3 25 MCG (1000 UT) Caps Take 1,000 Units by mouth daily.        Contact information for after-discharge care     Destination     [facility] .   Service: Skilled Nursing Contact information: [address] [place] [address] [phone]                    Discharge Exam: Ceasar Mons Weights   07/02/22 0600 07/06/22 2000 07/07/22 0500  Weight: 109.9 kg 108.6 kg 108.6 kg   HEENT:  [place]/AT, No thrush, no icterus CV:  RRR, no rub, no S3, no S4 Lung:  bibasilar rales.  No wheeze Abd:  soft/+BS, NT Ext:  Nonpitting edema, no lymphangitis, no synovitis, no  rash   Condition at discharge: stable  The results of significant diagnostics from this hospitalization (including imaging, microbiology, ancillary and laboratory) are listed below for reference.   Imaging Studies: DG Chest Port 1 View  Result Date: 07/06/2022 CLINICAL DATA:  Hypoxia EXAM: PORTABLE CHEST 1 VIEW COMPARISON:  06/30/2022 FINDINGS: Redemonstrated cardiomegaly. Aortic tortuosity and atherosclerosis. No focal pulmonary opacity. No pleural effusion pneumothorax. No acute osseous abnormality. IMPRESSION: No acute cardiopulmonary process. Electronically Signed   By: Wiliam Ke M.D.   On: 07/06/2022 19:40   DG Ankle Complete Left  Result Date: 07/06/2022 CLINICAL DATA:  Left distal fibular ORIF. EXAM: LEFT ANKLE COMPLETE - 3+ VIEW COMPARISON:  Left ankle radiographs 06/30/2022 FINDINGS: Images were performed intraoperatively without the presence of a radiologist. Interval distal approach intramedullary nail fixation of the previously seen fracture of the distal femoral metadiaphysis. Total fluoroscopy images: 3 Total fluoroscopy time: 82 seconds Total dose: Radiation Exposure Index (as provided by the fluoroscopic device): 3.98 mGy air Kerma Please see intraoperative findings for further detail. IMPRESSION: Intraoperative fluoroscopy provided for left distal fibular fracture ORIF. Electronically Signed   By: Neita Garnet M.D.   On: 07/06/2022 19:30   DG Ankle Left Port  Result Date: 07/06/2022 CLINICAL DATA:  Closed ankle fracture EXAM: PORTABLE LEFT ANKLE - 2 VIEW COMPARISON:  None Available. FINDINGS: Distal left fibular intramedullary nail is present fixating a distal fibular fracture. Alignment is anatomic. There is no dislocation. There is soft tissue swelling surrounding the ankle. IMPRESSION: Distal left fibular intramedullary nail fixating a distal fibular fracture. Alignment is anatomic. Electronically Signed   By: Darliss Cheney M.D.   On: 07/06/2022 19:18   DG C-Arm 1-60  Min-No Report  Result Date: 07/06/2022 Fluoroscopy was utilized by the requesting physician.  No radiographic interpretation.   US RENAL  Result Date: 07/01/2022 CLINICAL DATA:  Acute kidney injury EXAM: RENAL / URINARY TRACT ULTRASOUND COMPLETE COMPARISON:  CT 03/06/2021 FINDINGS: Right Kidney: Renal measurements: 10.3 x 4.0 x 5.3 cm = volume: 114.47 mL. Echogenicity within normal limits. No mass or hydronephrosis visualized. Left Kidney: Renal measurements: 11.0 x 4.8 x 4.2 cm = volume: 114.59 mL. Echogenicity within normal limits. No mass or hydronephrosis visualized. Bladder: Appears normal for degree of bladder distention. Ureteral jets are seen. Other: Note is made of a gallstone. IMPRESSION: No collecting system dilatation. Incidental note made of a gallstone Electronically Signed   By: Karen Kays M.D.   On: 07/01/2022 11:51   DG Ankle Complete Right  Result Date: 06/30/2022 CLINICAL DATA:  Bilateral ankle pain and swelling after fall EXAM: RIGHT ANKLE - COMPLETE 3+ VIEW; LEFT ANKLE COMPLETE - 3+ VIEW COMPARISON:  Left ankle radiographs 12/29/2014 FINDINGS: Left: Acute mildly displaced oblique fracture through the distal left fibular metadiaphysis. The fracture line extends into the tibiofibular syndesmosis above the ankle mortise. Mild lateral subluxation of the talus in relation to the tibia with widening of the medial ankle mortise. Marked soft tissue swelling about the left foot. Right: Nondisplaced transverse fracture through the distal right fibular metaphysis. The fracture line extends into the tibiofibular syndesmosis at the level of the ankle mortise. Marked soft tissue swelling about the lateral malleolus. IMPRESSION: 1. Left: Oblique displaced fracture through the distal left fibular metadiaphysis. Lateral subluxation of the left talus in relation to the tibia. 2. Right: Acute nondisplaced transverse fracture through the distal right fibular metaphysis. Electronically Signed   By: Minerva Fester M.D.   On: 06/30/2022 18:13   DG Ankle Complete Left  Result Date: 06/30/2022 CLINICAL DATA:  Bilateral ankle pain and swelling after fall EXAM: RIGHT ANKLE - COMPLETE 3+ VIEW; LEFT ANKLE COMPLETE - 3+ VIEW COMPARISON:  Left ankle radiographs 12/29/2014 FINDINGS: Left: Acute mildly displaced oblique fracture through the distal left fibular metadiaphysis. The fracture line extends into the tibiofibular syndesmosis above the ankle mortise. Mild lateral subluxation of the talus in relation to the tibia with widening of the medial ankle mortise. Marked soft tissue swelling about the left foot. Right: Nondisplaced transverse fracture through the distal right fibular metaphysis. The fracture line extends into the tibiofibular syndesmosis at the level of the ankle mortise. Marked soft tissue swelling  about the lateral malleolus. IMPRESSION: 1. Left: Oblique displaced fracture through the distal left fibular metadiaphysis. Lateral subluxation of the left talus in relation to the tibia. 2. Right: Acute nondisplaced transverse fracture through the distal right fibular metaphysis. Electronically Signed   By: Minerva Fester M.D.   On: 06/30/2022 18:13   DG Chest Port 1 View  Result Date: 06/30/2022 CLINICAL DATA:  Possible sepsis. EXAM: PORTABLE CHEST 1 VIEW COMPARISON:  Chest x-ray dated Jun 26, 2021. FINDINGS: Stable cardiomediastinal silhouette. Normal pulmonary vascularity. No focal consolidation, pleural effusion, or pneumothorax. No acute osseous abnormality. IMPRESSION: No active disease. Electronically Signed   By: Obie Dredge M.D.   On: 06/30/2022 11:05    Microbiology: Results for orders placed or performed during the hospital encounter of 06/30/22  Blood Culture (routine x 2)     Status: None   Collection Time: 06/30/22 11:31 AM   Specimen: Right Antecubital; Blood  Result Value Ref Range Status   Specimen Description RIGHT ANTECUBITAL  Final   Special Requests   Final    BOTTLES DRAWN  AEROBIC AND ANAEROBIC Blood Culture results may not be optimal due to an excessive volume of blood received in culture bottles   Culture   Final    NO GROWTH 5 DAYS Performed at [facility], [address] [place], [place] [address]    Report Status 07/05/2022 FINAL  Final  Blood Culture (routine x 2)     Status: None   Collection Time: 06/30/22 11:31 AM   Specimen: Left Antecubital; Blood  Result Value Ref Range Status   Specimen Description LEFT ANTECUBITAL  Final   Special Requests   Final    BOTTLES DRAWN AEROBIC AND ANAEROBIC Blood Culture adequate volume   Culture   Final    NO GROWTH 5 DAYS Performed at [facility], [address], [place], [place] [address]    Report Status 07/05/2022 FINAL  Final  Urine Culture     Status: Abnormal   Collection Time: 06/30/22  3:02 PM   Specimen: Urine, Random  Result Value Ref Range Status   Specimen Description   Final    URINE, RANDOM Performed at [facility], [address] [place], [place] [address]    Special Requests   Final    NONE Reflexed from [id number] Performed at [facility], [address] [place], [place] [address]    Culture >=100,000 COLONIES/mL ESCHERICHIA COLI (A)  Final   Report Status 07/02/2022 FINAL  Final   Organism ID, Bacteria ESCHERICHIA COLI (A)  Final      Susceptibility   Escherichia coli - MIC*    AMPICILLIN <=2 SENSITIVE Sensitive     CEFAZOLIN <=4 SENSITIVE Sensitive     CEFEPIME <=0.12 SENSITIVE Sensitive     CEFTRIAXONE <=0.25 SENSITIVE Sensitive     CIPROFLOXACIN <=0.25 SENSITIVE Sensitive     GENTAMICIN <=1 SENSITIVE Sensitive     IMIPENEM <=0.25 SENSITIVE Sensitive     NITROFURANTOIN <=16 SENSITIVE Sensitive     TRIMETH/SULFA <=20 SENSITIVE Sensitive     AMPICILLIN/SULBACTAM <=2 SENSITIVE Sensitive     PIP/TAZO <=4 SENSITIVE Sensitive     * >=100,000 COLONIES/mL ESCHERICHIA COLI  MRSA Next Gen by PCR, Nasal     Status: None   Collection Time: 06/30/22 10:00 PM   Specimen: Nasal Mucosa;  Nasal Swab  Result Value Ref Range Status   MRSA by PCR Next Gen NOT DETECTED NOT DETECTED Final    Comment: (NOTE) The GeneXpert MRSA Assay (  FDA approved for NASAL specimens only), is one component of a comprehensive MRSA colonization surveillance program. It is not intended to diagnose MRSA infection nor to guide or monitor treatment for MRSA infections. Test performance is not FDA approved in patients less than 59 years old. Performed at [facility], [address], [place], [place] [address]   Surgical pcr screen     Status: None   Collection Time: 07/06/22  5:40 AM   Specimen: Nasal Mucosa; Nasal Swab  Result Value Ref Range Status   MRSA, PCR NEGATIVE NEGATIVE Final   Staphylococcus aureus NEGATIVE NEGATIVE Final    Comment: (NOTE) The Xpert SA Assay (FDA approved for NASAL specimens in patients 47 years of age and older), is one component of a comprehensive surveillance program. It is not intended to diagnose infection nor to guide or monitor treatment. Performed at [facility], [address], [place], [place] [address]    *Note: Due to a large number of results and/or encounters for the requested time period, some results have not been displayed. A complete set of results can be found in Results Review.    Labs: CBC: Recent Labs  Lab 07/03/22 0510 07/05/22 0453 07/06/22 0450 07/07/22 0451 07/08/22 0454  WBC 8.7 8.9 9.1 10.4 11.2*  HGB 9.7* 10.5* 10.5* 10.7* 10.3*  HCT 30.0* 31.2* 32.0* 32.3* 31.3*  MCV 88.0 85.5 85.6 85.9 86.9  PLT 158 260 269 296 322   Basic Metabolic Panel: Recent Labs  Lab 07/05/22 0453 07/06/22 0450 07/07/22 0451 07/08/22 0454 07/09/22 0427  NA 135 135 136 137 138  K 3.4* 3.4* 4.1 3.2* 3.7  CL 101 100 101 101 103  CO2 22 24 23 22 23   GLUCOSE 132* 128* 191* 124* 129*  BUN 24* 25* 28* 41* 42*  CREATININE 1.16* 1.25* 1.37* 1.48* 1.41*  CALCIUM 8.5* 8.4* 8.1* 7.7* 8.1*  MG  --   --   --   --  1.6*   Liver Function Tests: No  results for input(s): "AST", "ALT", "ALKPHOS", "BILITOT", "PROT", "ALBUMIN" in the last 168 hours. CBG: Recent Labs  Lab 07/04/22 1705 07/07/22 1124  GLUCAP 127* 110*    Discharge time spent: greater than 30 minutes.  Signed: Catarina Hartshorn, MD [facility] 07/09/2022

## 2022-07-10 ENCOUNTER — Telehealth: Payer: Self-pay

## 2022-07-10 ENCOUNTER — Encounter (HOSPITAL_COMMUNITY): Payer: Self-pay | Admitting: Orthopedic Surgery

## 2022-07-10 ENCOUNTER — Telehealth: Payer: Self-pay | Admitting: *Deleted

## 2022-07-10 NOTE — Consult Note (Signed)
[facility] Customer service manager [facility]) Accountable Care Organization (ACO) [facility] Liaison Note  07/10/2022  Sheila [name] 09-15-1940 [id number]  Location: [facility] RN Hospital Liaison screened the patient remotely at [facility]  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 The patient was screened for readmission hospitalization with noted high risk score for unplanned readmission risk with 1 IP in 6 months.  The patient was assessed for potential [facility]) Care Management service needs for post hospital transition for care coordination. Review of patient's electronic medical record reveals patient admitted with Sepsis secondary to UTI. Pt discharged to SNF [facility]) a facility that will continue to manage pt needs. [facility] PAC-RN does not follow the facility patients.    [facility] Care Management/Population Health does not replace or interfere with any arrangements made by the Inpatient Transition of Care team.   For questions contact:   Elliot Cousin, RN, BSN [facility] Liaison [place]   [facility]  Population Health Office Hours MTWF  8:00 am-6:00 pm Off on Thursday [phone] mobile [phone] [Office toll free line][facility] Office Hours are M-F 8:30 - 5 pm 24 hour nurse advise line [phone] Concierge  [email or web address]

## 2022-07-10 NOTE — Telephone Encounter (Signed)
Returned called to Leeann Must read her what Dr. Dallas Schimke had put in his staff message to me about this patient's dressing. She said they would keep the 2 weeks appointment.

## 2022-07-10 NOTE — Transitions of Care (Post Inpatient/ED Visit) (Signed)
   07/10/2022  Name: MARLEAN SNOWBERGER MRN: [id number] DOB: [birth year]  Chart Review for [facility] s/p inpatient discharge from [facility] on 07/09/22. Patient was hospitalized for Sepsis secondary to UTI. Patient was discharged to [facility]. No TOC telephone call needed at this time per protocol.   Demetrios Loll, BSN, RN-BC RN Care Coordinator [facility]  [facility] Direct Dial: [phone] Main #: [phone]

## 2022-07-13 DIAGNOSIS — N390 Urinary tract infection, site not specified: Secondary | ICD-10-CM | POA: Diagnosis not present

## 2022-07-13 DIAGNOSIS — R52 Pain, unspecified: Secondary | ICD-10-CM | POA: Diagnosis not present

## 2022-07-13 DIAGNOSIS — N179 Acute kidney failure, unspecified: Secondary | ICD-10-CM | POA: Diagnosis not present

## 2022-07-13 DIAGNOSIS — R6520 Severe sepsis without septic shock: Secondary | ICD-10-CM | POA: Diagnosis not present

## 2022-07-13 DIAGNOSIS — Z7189 Other specified counseling: Secondary | ICD-10-CM | POA: Diagnosis not present

## 2022-07-13 DIAGNOSIS — N189 Chronic kidney disease, unspecified: Secondary | ICD-10-CM | POA: Diagnosis not present

## 2022-07-13 DIAGNOSIS — S82832D Other fracture of upper and lower end of left fibula, subsequent encounter for closed fracture with routine healing: Secondary | ICD-10-CM | POA: Diagnosis not present

## 2022-07-13 DIAGNOSIS — S82842D Displaced bimalleolar fracture of left lower leg, subsequent encounter for closed fracture with routine healing: Secondary | ICD-10-CM | POA: Diagnosis not present

## 2022-07-13 DIAGNOSIS — A419 Sepsis, unspecified organism: Secondary | ICD-10-CM | POA: Diagnosis not present

## 2022-07-13 DIAGNOSIS — Z79899 Other long term (current) drug therapy: Secondary | ICD-10-CM | POA: Diagnosis not present

## 2022-07-14 DIAGNOSIS — S82832D Other fracture of upper and lower end of left fibula, subsequent encounter for closed fracture with routine healing: Secondary | ICD-10-CM | POA: Diagnosis not present

## 2022-07-14 DIAGNOSIS — I10 Essential (primary) hypertension: Secondary | ICD-10-CM | POA: Diagnosis not present

## 2022-07-14 DIAGNOSIS — I5042 Chronic combined systolic (congestive) and diastolic (congestive) heart failure: Secondary | ICD-10-CM | POA: Diagnosis not present

## 2022-07-14 DIAGNOSIS — S82842D Displaced bimalleolar fracture of left lower leg, subsequent encounter for closed fracture with routine healing: Secondary | ICD-10-CM | POA: Diagnosis not present

## 2022-07-14 DIAGNOSIS — Z79899 Other long term (current) drug therapy: Secondary | ICD-10-CM | POA: Diagnosis not present

## 2022-07-14 DIAGNOSIS — R52 Pain, unspecified: Secondary | ICD-10-CM | POA: Diagnosis not present

## 2022-07-15 DIAGNOSIS — N390 Urinary tract infection, site not specified: Secondary | ICD-10-CM | POA: Diagnosis not present

## 2022-07-15 DIAGNOSIS — S82842D Displaced bimalleolar fracture of left lower leg, subsequent encounter for closed fracture with routine healing: Secondary | ICD-10-CM | POA: Diagnosis not present

## 2022-07-15 DIAGNOSIS — S82832D Other fracture of upper and lower end of left fibula, subsequent encounter for closed fracture with routine healing: Secondary | ICD-10-CM | POA: Diagnosis not present

## 2022-07-15 DIAGNOSIS — I10 Essential (primary) hypertension: Secondary | ICD-10-CM | POA: Diagnosis not present

## 2022-07-15 DIAGNOSIS — E039 Hypothyroidism, unspecified: Secondary | ICD-10-CM | POA: Diagnosis not present

## 2022-07-15 DIAGNOSIS — E785 Hyperlipidemia, unspecified: Secondary | ICD-10-CM | POA: Diagnosis not present

## 2022-07-15 DIAGNOSIS — N189 Chronic kidney disease, unspecified: Secondary | ICD-10-CM | POA: Diagnosis not present

## 2022-07-15 DIAGNOSIS — R6520 Severe sepsis without septic shock: Secondary | ICD-10-CM | POA: Diagnosis not present

## 2022-07-15 DIAGNOSIS — J449 Chronic obstructive pulmonary disease, unspecified: Secondary | ICD-10-CM | POA: Diagnosis not present

## 2022-07-16 DIAGNOSIS — E039 Hypothyroidism, unspecified: Secondary | ICD-10-CM | POA: Diagnosis not present

## 2022-07-16 DIAGNOSIS — E785 Hyperlipidemia, unspecified: Secondary | ICD-10-CM | POA: Diagnosis not present

## 2022-07-16 DIAGNOSIS — Z7189 Other specified counseling: Secondary | ICD-10-CM | POA: Diagnosis not present

## 2022-07-16 DIAGNOSIS — Z79899 Other long term (current) drug therapy: Secondary | ICD-10-CM | POA: Diagnosis not present

## 2022-07-16 DIAGNOSIS — J449 Chronic obstructive pulmonary disease, unspecified: Secondary | ICD-10-CM | POA: Diagnosis not present

## 2022-07-17 ENCOUNTER — Ambulatory Visit: Payer: 59 | Admitting: Internal Medicine

## 2022-07-21 ENCOUNTER — Emergency Department (HOSPITAL_COMMUNITY)
Admission: EM | Admit: 2022-07-21 | Discharge: 2022-07-25 | Disposition: E | Payer: 59 | Attending: Emergency Medicine | Admitting: Emergency Medicine

## 2022-07-21 ENCOUNTER — Other Ambulatory Visit (INDEPENDENT_AMBULATORY_CARE_PROVIDER_SITE_OTHER): Payer: 59

## 2022-07-21 ENCOUNTER — Encounter: Payer: Self-pay | Admitting: Orthopedic Surgery

## 2022-07-21 ENCOUNTER — Encounter: Payer: 59 | Admitting: Orthopedic Surgery

## 2022-07-21 ENCOUNTER — Ambulatory Visit (INDEPENDENT_AMBULATORY_CARE_PROVIDER_SITE_OTHER): Payer: 59 | Admitting: Orthopedic Surgery

## 2022-07-21 DIAGNOSIS — Y93C9 Activity, other involving computer technology and electronic devices: Secondary | ICD-10-CM | POA: Diagnosis not present

## 2022-07-21 DIAGNOSIS — I469 Cardiac arrest, cause unspecified: Secondary | ICD-10-CM

## 2022-07-21 DIAGNOSIS — S82831A Other fracture of upper and lower end of right fibula, initial encounter for closed fracture: Secondary | ICD-10-CM | POA: Diagnosis not present

## 2022-07-21 DIAGNOSIS — S82831D Other fracture of upper and lower end of right fibula, subsequent encounter for closed fracture with routine healing: Secondary | ICD-10-CM

## 2022-07-21 DIAGNOSIS — R001 Bradycardia, unspecified: Secondary | ICD-10-CM | POA: Diagnosis not present

## 2022-07-21 DIAGNOSIS — S82842A Displaced bimalleolar fracture of left lower leg, initial encounter for closed fracture: Secondary | ICD-10-CM | POA: Diagnosis not present

## 2022-07-21 DIAGNOSIS — R464 Slowness and poor responsiveness: Secondary | ICD-10-CM | POA: Diagnosis not present

## 2022-07-21 DIAGNOSIS — S82842D Displaced bimalleolar fracture of left lower leg, subsequent encounter for closed fracture with routine healing: Secondary | ICD-10-CM

## 2022-07-21 DIAGNOSIS — R7309 Other abnormal glucose: Secondary | ICD-10-CM | POA: Diagnosis not present

## 2022-07-21 DIAGNOSIS — I468 Cardiac arrest due to other underlying condition: Secondary | ICD-10-CM | POA: Diagnosis not present

## 2022-07-21 LAB — I-STAT CHEM 8, ED
BUN: 75 mg/dL — ABNORMAL HIGH (ref 8–23)
Calcium, Ion: 0.9 mmol/L — ABNORMAL LOW (ref 1.15–1.40)
Chloride: 101 mmol/L (ref 98–111)
Creatinine, Ser: 2 mg/dL — ABNORMAL HIGH (ref 0.44–1.00)
Glucose, Bld: 336 mg/dL — ABNORMAL HIGH (ref 70–99)
HCT: 33 % — ABNORMAL LOW (ref 36.0–46.0)
Hemoglobin: 11.2 g/dL — ABNORMAL LOW (ref 12.0–15.0)
Potassium: 5.2 mmol/L — ABNORMAL HIGH (ref 3.5–5.1)
Sodium: 149 mmol/L — ABNORMAL HIGH (ref 135–145)
TCO2: 40 mmol/L — ABNORMAL HIGH (ref 22–32)

## 2022-07-21 MED ORDER — EPINEPHRINE HCL 5 MG/250ML IV SOLN IN NS: 0.5000 ug/min | INTRAVENOUS | Status: DC

## 2022-07-21 MED ORDER — EPINEPHRINE HCL 5 MG/250ML IV SOLN IN NS
INTRAVENOUS | Status: AC
  Filled 2022-07-21: qty 250

## 2022-07-21 MED ORDER — SODIUM BICARBONATE 8.4 % IV SOLN: 100.0000 meq | Freq: Once | INTRAVENOUS | Status: DC

## 2022-07-22 LAB — CBG MONITORING, ED: Glucose-Capillary: 289 mg/dL — ABNORMAL HIGH (ref 70–99)

## 2022-07-25 NOTE — ED Provider Notes (Signed)
[place] EMERGENCY DEPARTMENT AT [facility] Provider Note   CSN: [id number] Arrival date & time: 06/26/2022  1722     History {Add pertinent medical, surgical, social history, OB history to HPI:1} Chief Complaint  Patient presents with  . Cardiac Arrest    Sheila [name] is a 82 y.o. female.  She presents as a cardiac arrest from a nursing home.  Initial call was for chest pain.  Fire department started on a nonrebreather EMS found to be with low sats.  During transport she became unresponsive bradycardic and lost pulses.  CPR was given and epinephrine was given through left shoulder intraosseous.  She presents in PEA.  Initially felt to have may be femoral pulses.  CPR restarted.  Esophageal airway removed and orally intubated 7.5 ET tube.  Equal breath sounds bilaterally.  She was intermittent PEA and having faint pulses.  Epinephrine and atropine epinephrine drip.  Attempted right femoral central line.  Patient ultimately bradyed back down, bedside ultrasound showing PEA.  Called at 1751  The history is provided by the EMS personnel.  Cardiac Arrest Witnessed by:  Healthcare provider Incident location:  En route to the ED Time before ALS initiated:  Immediate Condition upon EMS arrival:  Agonal respirations Pulse:  Absent Initial cardiac rhythm per EMS:  PEA Treatments prior to arrival:  ACLS protocol Airway:  Combitube Rhythm on admission to ED:  Unchanged Associated symptoms: chest pain        Home Medications Prior to Admission medications   Medication Sig Start Date End Date Taking? Authorizing Provider  albuterol (PROVENTIL) (2.5 MG/3ML) 0.083% nebulizer solution NEBULIZE 1 VIAL EVERY 6 TO 8 HOURS AS NEEDED FOR WHEEZING OR SHORTNESS OF BREATH 06/03/22   Jannifer Rodney A, FNP  ALPRAZolam Prudy Feeler) 0.5 MG tablet Take 1 tablet (0.5 mg total) by mouth 2 (two) times daily as needed. 07/09/22   Catarina Hartshorn, MD  BREZTRI AEROSPHERE 160-9-4.8 MCG/ACT AERO INHALE 2 PUFFS INTO  LUNGS 2 TIMES A DAY 06/10/22   Hawks, Neysa Bonito A, FNP  Cholecalciferol (VITAMIN D3) 25 MCG (1000 UT) CAPS Take 1,000 Units by mouth daily.     [provider]  cinacalcet (SENSIPAR) 30 MG tablet Take 1 tablet (30 mg total) by mouth every other day. 05/19/21   Dani Gobble, NP  CRANBERRY PO Take 1 tablet by mouth daily.    [provider]  denosumab (PROLIA) 60 MG/ML SOSY injection Inject 60 mg into the skin every 6 (six) months. 03/24/22   Junie Spencer, FNP  dexlansoprazole (DEXILANT) 60 MG capsule Take 1 capsule (60 mg total) by mouth daily. 06/15/22   Jannifer Rodney A, FNP  fluticasone (CUTIVATE) 0.05 % cream Apply topically 2 (two) times daily as needed. 11/24/21   [provider]  gabapentin (NEURONTIN) 300 MG capsule TAKE ONE CAPSULE THREE TIMES DAILY 06/10/22   Junie Spencer, FNP  levocetirizine (XYZAL) 5 MG tablet TAKE ONE TABLET ONCE DAILY EVERY EVENING 04/30/22   Jannifer Rodney A, FNP  levothyroxine (SYNTHROID) 100 MCG tablet TAKE ONE TABLET ONCE DAILY 03/14/22   Jannifer Rodney A, FNP  metoprolol tartrate (LOPRESSOR) 25 MG tablet TAKE 1/2 TABLET TWICE DAILY 06/10/22   Jannifer Rodney A, FNP  oxyCODONE (OXY IR/ROXICODONE) 5 MG immediate release tablet Take 1 tablet (5 mg total) by mouth every 4 (four) hours as needed for moderate pain or severe pain. 07/09/22   Catarina Hartshorn, MD  Pitavastatin Calcium (LIVALO) 4 MG TABS TAKE ONE TABLET ONCE  DAILY 05/28/22   Junie Spencer, FNP  PROLIA 60 MG/ML SOSY injection Inject into the skin. 09/16/21   [provider]  sacubitril-valsartan (ENTRESTO) 97-103 MG Take 1 tablet by mouth 2 (two) times daily. 04/16/22   Chrystie Nose, MD  sertraline (ZOLOFT) 100 MG tablet TAKE ONE TABLET ONCE DAILY 05/18/22   Jannifer Rodney A, FNP  spironolactone (ALDACTONE) 25 MG tablet TAKE ONE TABLET ONCE DAILY 04/30/22   Hilty, Lisette Abu, MD  Tetrahydrozoline HCl (VISINE OP) Place 1 drop into both eyes daily as needed (dry eyes/itching).     [provider]  torsemide (DEMADEX) 20 MG tablet Take 1 tablet (20 mg total) by mouth 2 (two) times daily. TAKE 2 TABLETS DAILY AS DIRECTED Strength: 20 mg 02/27/22   Jannifer Rodney A, FNP      Allergies    Ibuprofen, Benadryl [diphenhydramine hcl], Lipitor [atorvastatin], Baclofen, Hydrocodone, Codeine, Morphine and codeine, and Tdap [tetanus-diphth-acell pertussis]    Review of Systems   Review of Systems  Unable to perform ROS: Intubated  Cardiovascular:  Positive for chest pain.    Physical Exam Updated Vital Signs BP (!) 62/36   Pulse 67   Resp 16   SpO2 94%  Physical Exam Vitals and nursing note reviewed.  Constitutional:      Appearance: She is well-developed. She is obese. She is ill-appearing.  HENT:     Head: Normocephalic and atraumatic.  Eyes:     Conjunctiva/sclera: Conjunctivae normal.  Cardiovascular:     Heart sounds: No murmur heard.    Comments: Wide-complex slow rhythm Pulmonary:     Comments: Agonal respirations Abdominal:     Palpations: Abdomen is soft.     Tenderness: There is no abdominal tenderness. There is no guarding or rebound.  Musculoskeletal:     Cervical back: Neck supple.     Comments: She is in bilateral orthotic boots  Skin:    Coloration: Skin is pale.  Neurological:     Comments: She is unresponsive without corneal blink no response to pain     ED Results / Procedures / Treatments   Labs (all labs ordered are listed, but only abnormal results are displayed) Labs Reviewed  BASIC METABOLIC PANEL  CBC WITH DIFFERENTIAL/PLATELET  BLOOD GAS, ARTERIAL  PROTIME-INR  I-STAT CHEM 8, ED  CBG MONITORING, ED  TYPE AND SCREEN  TROPONIN I (HIGH SENSITIVITY)    EKG None  Radiology DG Ankle Complete Left  Result Date: 07/07/2022 X-rays left ankle were obtained in clinic today.  These were compared to prior x-rays.  Distal fibula fracture is appreciated, without interval displacement.  There is no medial clear space  widening.  Implant within the left fibula remains in stable position.  There has been no subsidence.  No change in the overall appearance.  No change in alignment of the ankle.  Mortise is congruent.  No syndesmotic disruption.  No bony lesions.  Impression: Left ankle fracture in stable alignment following operative fixation of a distal fibula fracture with an intramedullary nail.   DG Ankle Complete Right  Result Date: 07/19/2022 X-rays of the right ankle were obtained in clinic today.  No acute injuries are noted.  There is a minimally displaced fracture of the distal fibula.  Mortise is congruent.  No syndesmotic disruption.  There is been interval consolidation of the distal fibula fracture.  No bony lesions.  Impression: Right distal fibula fracture in stable alignment    Procedures Procedures  {Document cardiac monitor, telemetry  assessment procedure when appropriate:1}  Medications Ordered in ED Medications  sodium bicarbonate injection 100 mEq (has no administration in time range)  EPINEPHrine NaCl 5-0.9 MG/250ML-% premix infusion (has no administration in time range)  EPINEPHrine (ADRENALIN) 5 mg in NS 250 mL (0.02 mg/mL) premix infusion (has no administration in time range)    ED Course/ Medical Decision Making/ A&P Clinical Course as of 07/19/2022 1907  Tue Jul 21, 2022  1809 Discussed with ME Randa Evens.  He was not sure if it was going to be an ME case so he said the patient can be brought down to the morgue and he will do some research on her.  Patient does have a PCP Thereasa Distance.  Family is here now so I will inform them [MB]  1907 Patient's 2 daughters were here so I updated them.  More family is coming. [MB]    Clinical Course User Index [MB] Terrilee Files, MD   {   Click here for ABCD2, HEART and other calculatorsREFRESH Note before signing :1}                          Medical Decision Making Amount and/or Complexity of Data Reviewed Labs:  ordered.  Risk Prescription drug management.   Full ACLS protocol therapy atropine given.  Intubation secured.  CPR.  Bedside ultrasound and attempted right groin central line.  {Document critical care time when appropriate:1} {Document review of labs and clinical decision tools ie heart score, Chads2Vasc2 etc:1}  {Document your independent review of radiology images, and any outside records:1} {Document your discussion with family members, caretakers, and with consultants:1} {Document social determinants of health affecting pt's care:1} {Document your decision making why or why not admission, treatments were needed:1} Final Clinical Impression(s) / ED Diagnoses Final diagnoses:  None    Rx / DC Orders ED Discharge Orders     None

## 2022-07-25 NOTE — ED Notes (Signed)
The patient's jewelry and glasses were given to the patient's family.

## 2022-07-25 NOTE — Progress Notes (Signed)
Orthopaedic Postop Note  Assessment: Sheila [name] is a 82 y.o. female s/p ORIF of left bimalleolar ankle fracture with fibulock implant; nonoperative management of right distal fibula fracture  DOS: 07/06/2022  Plan: Sheila [name] is recovering appropriately following operative fixation of a left bimalleolar ankle fracture, as well as a nonoperative right distal fibula fracture.  Sutures from the left ankle were removed.  Steri-Strips were placed.  Radiographs of bilateral ankles demonstrates interval consolidation, and appropriate alignment.  No interval displacement.  Continue to weight-bear as tolerated, while using both boots.  Okay to remove the boots to work on her ankle range of motion.  I would like to see her back in 1 month for repeat evaluation.  Paperwork was completed for her facility.  They will contact the clinic with questions or concerns.   Follow-up: Return in about 4 weeks (around 08/18/2022). XR at next visit: Bilateral ankles  Subjective:  Chief Complaint  Patient presents with   Fracture    R ankle DOI 06/30/22   Routine Post Op    L ankle DOS 07/06/22    History of Present Illness: Sheila [name] is a 82 y.o. female who presents following the above stated procedure.  She underwent surgery for a functional bimalleolar ankle fracture on the left.  A fibulock implant was used.  We have treated the right ankle without surgery.  She remains in a walking boots, and states that she is walking with the assistance of a walker.  She remains in a nursing facility.  No pain in the left ankle.  She is complaining of pain in the medial right ankle.  Review of Systems: No fevers or chills No numbness or tingling No Chest Pain + shortness of breath   Objective: There were no vitals taken for this visit.  Physical Exam:  Evaluation left ankle demonstrates healing surgical incisions.  No surrounding redness.  No drainage.  No tenderness palpation over the lateral ankle.  She  tolerates gentle range of motion.  Toes warm and well-perfused.  Evaluation of the right ankle demonstrates no swelling.  She does have tenderness palpation of the medial malleolus.  No tenderness over the lateral ankle.  Toes warm well-perfused.  IMAGING: I personally ordered and reviewed the following images:  X-rays of the right ankle were obtained in clinic today.  No acute injuries are noted.  There is a minimally displaced fracture of the distal fibula.  Mortise is congruent.  No syndesmotic disruption.  There is been interval consolidation of the distal fibula fracture.  No bony lesions.  Impression: Right distal fibula fracture in stable alignment  X-rays left ankle were obtained in clinic today.  These were compared to prior x-rays.  Distal fibula fracture is appreciated, without interval displacement.  There is no medial clear space widening.  Implant within the left fibula remains in stable position.  There has been no subsidence.  No change in the overall appearance.  No change in alignment of the ankle.  Mortise is congruent.  No syndesmotic disruption.  No bony lesions.  Impression: Left ankle fracture in stable alignment following operative fixation of a distal fibula fracture with an intramedullary nail.   Oliver Barre, MD 07/05/2022 10:50 AM

## 2022-07-25 NOTE — ED Triage Notes (Signed)
Pt brought in by [facility] from [facility] with c/o chest pain and SOB. Pt was on a non rebreather by fire Dept.  [facility] was alert and on the way to the truck pt become unresponsive then respiratory arrest and then cardiac arrest for  4 mis and got ROSC and then coded again. 2 rounds of EPI given. 250 NS.  I &O in right Humerous due to bilateral tib fib fx. ROSC upon entrance.

## 2022-07-25 NOTE — ED Notes (Signed)
!   Ring noted to left hand , 1 ring noted to right hand, left wrist 1 medical bracelet and 1 watch. Glasses at bedside.

## 2022-07-25 NOTE — ED Notes (Addendum)
Intubation = 7.19mm and 22 at the lip 527pm= Epi 1 mg given  530pm= Atropine 1mg  531pm=CPR no pulse 533pm=Bicarb PEA  534pm= order for EPI drip 537pm= CPR 541pm=EpI 1mg  - PEA 542pm=holding CPR- pulse check 543pm=CPR, Epi drip 56ml/hr 547pm=pule check 548- CPR 551= Time of death

## 2022-07-25 DEATH — deceased

## 2022-08-18 ENCOUNTER — Ambulatory Visit: Payer: 59 | Admitting: Orthopedic Surgery
